# Patient Record
Sex: Female | Born: 1995
Health system: Southern US, Community
[De-identification: ages and names within clinical notes are randomized; demographics above are authoritative.]

## PROBLEM LIST (undated history)

## (undated) DIAGNOSIS — G250 Essential tremor: Secondary | ICD-10-CM

## (undated) DIAGNOSIS — R519 Headache, unspecified: Secondary | ICD-10-CM

## (undated) DIAGNOSIS — R110 Nausea: Secondary | ICD-10-CM

## (undated) DIAGNOSIS — R011 Cardiac murmur, unspecified: Secondary | ICD-10-CM

## (undated) DIAGNOSIS — R1013 Epigastric pain: Secondary | ICD-10-CM

## (undated) DIAGNOSIS — K219 Gastro-esophageal reflux disease without esophagitis: Secondary | ICD-10-CM

## (undated) DIAGNOSIS — R569 Unspecified convulsions: Secondary | ICD-10-CM

## (undated) DIAGNOSIS — F419 Anxiety disorder, unspecified: Secondary | ICD-10-CM

## (undated) DIAGNOSIS — F840 Autistic disorder: Secondary | ICD-10-CM

## (undated) DIAGNOSIS — F329 Major depressive disorder, single episode, unspecified: Secondary | ICD-10-CM

## (undated) DIAGNOSIS — F32A Depression, unspecified: Secondary | ICD-10-CM

## (undated) HISTORY — DX: Gastro-esophageal reflux disease without esophagitis: K21.9

## (undated) HISTORY — DX: Essential tremor: G25.0

## (undated) HISTORY — PX: OTHER SURGICAL HISTORY: SHX169

## (undated) HISTORY — DX: Headache, unspecified: R51.9

## (undated) HISTORY — DX: Autistic disorder: F84.0

## (undated) HISTORY — DX: Epigastric pain: R10.13

## (undated) HISTORY — PX: NO PAST SURGERIES: SHX2092

## (undated) HISTORY — PX: UPPER GI ENDOSCOPY: SHX6162

## (undated) HISTORY — DX: Nausea: R11.0

---

## 2016-12-10 ENCOUNTER — Encounter (HOSPITAL_COMMUNITY): Payer: Self-pay | Admitting: Emergency Medicine

## 2016-12-10 ENCOUNTER — Ambulatory Visit (HOSPITAL_COMMUNITY)
Admission: EM | Admit: 2016-12-10 | Discharge: 2016-12-10 | Disposition: A | Discharge status: Home / Self Care | Payer: BLUE CROSS/BLUE SHIELD

## 2016-12-10 DIAGNOSIS — N390 Urinary tract infection, site not specified: Secondary | ICD-10-CM

## 2016-12-10 DIAGNOSIS — R350 Frequency of micturition: Secondary | ICD-10-CM | POA: Diagnosis not present

## 2016-12-10 HISTORY — DX: Depression, unspecified: F32.A

## 2016-12-10 HISTORY — DX: Unspecified convulsions: R56.9

## 2016-12-10 HISTORY — DX: Major depressive disorder, single episode, unspecified: F32.9

## 2016-12-10 HISTORY — DX: Anxiety disorder, unspecified: F41.9

## 2016-12-10 LAB — POCT URINALYSIS DIP (DEVICE)
Bilirubin Urine: NEGATIVE
GLUCOSE, UA: NEGATIVE mg/dL
Hgb urine dipstick: NEGATIVE
Ketones, ur: NEGATIVE mg/dL
LEUKOCYTES UA: NEGATIVE
NITRITE: NEGATIVE
Protein, ur: NEGATIVE mg/dL
Specific Gravity, Urine: 1.015 (ref 1.005–1.030)
UROBILINOGEN UA: 0.2 mg/dL (ref 0.0–1.0)
pH: 7 (ref 5.0–8.0)

## 2016-12-10 NOTE — Discharge Instructions (Signed)
For any worsening or new symptoms may return. Try to follow up with primary care. The medication to take for any discomfort is called AZO standard, the generic is phenazopyridine which is generic. Your urine test is completely normal. No signs of infection.

## 2016-12-10 NOTE — ED Triage Notes (Signed)
Pt reports frequency of urination.  She denies any other symptoms.

## 2016-12-10 NOTE — ED Provider Notes (Addendum)
[facility]    CSN: [id number] Arrival date & time: 12/10/16  1036     History   Chief Complaint Chief Complaint  Patient presents with  . Urinary Tract Infection    HPI Kilee Hedding is a 21 y.o. female.   21 year old female who recently moved to [place] from [place] complaining of urinary frequency for just a few days. Denies dysuria or other discomfort with urination. She states she has a normal strain and empties her bladder normally. Occasionally fish she feels some urgency as if she is got to go again a while after she urinates. No fever or chills, abdominal or pelvic pain, no vaginal discharge.      Past Medical History:  Diagnosis Date  . Anxiety   . Depression   . Seizures (HCC)     There are no active problems to display for this patient.   History reviewed. No pertinent surgical history.  OB History    No data available       Home Medications    Prior to Admission medications   Medication Sig Start Date End Date Taking? Authorizing Provider  ARIPiprazole (ABILIFY) 5 MG tablet Take 5 mg by mouth 2 (two) times daily.   Yes [provider]  buPROPion (WELLBUTRIN) 100 MG tablet Take 100 mg by mouth 2 (two) times daily.   Yes [provider]  clonazePAM (KLONOPIN) 0.5 MG tablet Take 0.5 mg by mouth 2 (two) times daily as needed for anxiety.   Yes [provider]  levETIRAcetam (KEPPRA) 500 MG tablet Take 500 mg by mouth 2 (two) times daily.   Yes [provider]  primidone (MYSOLINE) 50 MG tablet Take 50 mg by mouth 2 (two) times daily.   Yes [provider]  vortioxetine HBr (TRINTELLIX) 10 MG TABS Take 10 mg by mouth 2 (two) times daily.   Yes [provider]    Family History History reviewed. No pertinent family history.  Social History Social History  Substance Use Topics  . Smoking status: Never Smoker  . Smokeless tobacco: Never Used  . Alcohol use No      Allergies   Patient has no known allergies.   Review of Systems Review of Systems  Constitutional: Negative.   Respiratory: Negative.   Gastrointestinal: Negative.   Genitourinary: Positive for frequency. Negative for difficulty urinating, dysuria, flank pain, genital sores, menstrual problem, vaginal bleeding and vaginal discharge.  Neurological: Negative.   All other systems reviewed and are negative.    Physical Exam Triage Vital Signs ED Triage Vitals [12/10/16 1116]  Enc Vitals Group     BP 100/67     Pulse Rate 80     Resp      Temp 99 F (37.2 C)     Temp Source Oral     SpO2 100 %     Weight      Height      Head Circumference      Peak Flow      Pain Score      Pain Loc      Pain Edu?      Excl. in GC?    No data found.   Updated Vital Signs BP 100/67 (BP Location: Right Arm)   Pulse 80   Temp 99 F (37.2 C) (Oral)   LMP 11/19/2016 (Approximate)   SpO2 100%   Visual Acuity Right Eye Distance:   Left Eye Distance:   Bilateral Distance:  Right Eye Near:   Left Eye Near:    Bilateral Near:     Physical Exam  Constitutional: She is oriented to person, place, and time. She appears well-developed and well-nourished. No distress.  Eyes: EOM are normal.  Neck: Neck supple.  Cardiovascular: Normal rate.   Pulmonary/Chest: Effort normal. No respiratory distress.  Musculoskeletal: She exhibits no edema.  Neurological: She is alert and oriented to person, place, and time. She exhibits normal muscle tone.  Skin: Skin is warm and dry.  Psychiatric: She has a normal mood and affect.  Nursing note and vitals reviewed.    UC Treatments / Results  Labs (all labs ordered are listed, but only abnormal results are displayed) Labs Reviewed  POCT URINALYSIS DIP (DEVICE)   Results for orders placed or performed during the hospital encounter of 12/10/16  POCT urinalysis dip (device)  Result Value Ref Range   Glucose, UA NEGATIVE NEGATIVE mg/dL    Bilirubin Urine NEGATIVE NEGATIVE   Ketones, ur NEGATIVE NEGATIVE mg/dL   Specific Gravity, Urine 1.015 1.005 - 1.030   Hgb urine dipstick NEGATIVE NEGATIVE   pH 7.0 5.0 - 8.0   Protein, ur NEGATIVE NEGATIVE mg/dL   Urobilinogen, UA 0.2 0.0 - 1.0 mg/dL   Nitrite NEGATIVE NEGATIVE   Leukocytes, UA NEGATIVE NEGATIVE    EKG  EKG Interpretation None       Radiology No results found.  Procedures Procedures (including critical care time)  Medications Ordered in UC Medications - No data to display   Initial Impression / Assessment and Plan / UC Course  I have reviewed the triage vital signs and the nursing notes.  Pertinent labs & imaging results that were available during my care of the patient were reviewed by me and considered in my medical decision making (see chart for details).     For any worsening or new symptoms may return. Try to follow up with primary care. The medication to take for any discomfort is called AZO standard, the generic is phenazopyridine which is generic. Your urine test is completely normal. No signs of infection.   Final Clinical Impressions(s) / UC Diagnoses   Final diagnoses:  Urinary frequency  Lower urinary tract infectious disease    New Prescriptions New Prescriptions   No medications on file   Written and verbal instructions given to patient and discharged at 1205 hrs.  Controlled Substance Prescriptions [place] Controlled Substance Registry consulted? Not Applicable   Hayden RasmussenMabe, Corneisha Alvi, NP 12/10/16 1205    Hayden RasmussenMabe, Brianne Maina, NP 12/10/16 1216

## 2016-12-16 ENCOUNTER — Encounter (HOSPITAL_COMMUNITY): Payer: Self-pay | Admitting: Emergency Medicine

## 2016-12-16 ENCOUNTER — Ambulatory Visit (HOSPITAL_COMMUNITY)
Admission: EM | Admit: 2016-12-16 | Discharge: 2016-12-16 | Disposition: A | Discharge status: Home / Self Care | Payer: BLUE CROSS/BLUE SHIELD | Attending: Family Medicine | Admitting: Family Medicine

## 2016-12-16 DIAGNOSIS — K5900 Constipation, unspecified: Secondary | ICD-10-CM | POA: Diagnosis not present

## 2016-12-16 DIAGNOSIS — R3915 Urgency of urination: Secondary | ICD-10-CM

## 2016-12-16 LAB — POCT URINALYSIS DIP (DEVICE)
BILIRUBIN URINE: NEGATIVE
GLUCOSE, UA: NEGATIVE mg/dL
HGB URINE DIPSTICK: NEGATIVE
Ketones, ur: NEGATIVE mg/dL
LEUKOCYTES UA: NEGATIVE
Nitrite: NEGATIVE
Protein, ur: NEGATIVE mg/dL
Urobilinogen, UA: 0.2 mg/dL (ref 0.0–1.0)
pH: 5.5 (ref 5.0–8.0)

## 2016-12-16 NOTE — ED Triage Notes (Addendum)
Seen 8/14, continued urgency.  Patient is having nausea for a week.  Vomited medicines that were recommended at last visit.

## 2016-12-16 NOTE — ED Provider Notes (Signed)
[facility]    CSN: [id number] Arrival date & time: 12/16/16  1203     History   Chief Complaint Chief Complaint  Patient presents with  . Nausea    HPI Jill Alexander is a 21 y.o. female.   21 year old female comes in with mother for continued urinary urgency after being seen 6 days ago. She now also has nausea. She drinks 2-3 cups of coffee every day, but states that is normal for her. She denies any fever, chills, night sweats. Denies abdominal pain, vomiting. She has been having increased straining, with small hard stools, unsure when last bowel movement was. Denies blood in stool. Denies back pain. Denies vaginal discharge, itching/pain, vaginal spotting. Denies dysuria, hematuria.      Past Medical History:  Diagnosis Date  . Anxiety   . Depression   . Seizures (HCC)     There are no active problems to display for this patient.   History reviewed. No pertinent surgical history.  OB History    No data available       Home Medications    Prior to Admission medications   Medication Sig Start Date End Date Taking? Authorizing Provider  LamoTRIgine (LAMICTAL PO) Take by mouth.   Yes [provider]  ARIPiprazole (ABILIFY) 5 MG tablet Take 5 mg by mouth 2 (two) times daily.    [provider]  buPROPion (WELLBUTRIN) 100 MG tablet Take 100 mg by mouth 2 (two) times daily.    [provider]  clonazePAM (KLONOPIN) 0.5 MG tablet Take 0.5 mg by mouth 2 (two) times daily as needed for anxiety.    [provider]  levETIRAcetam (KEPPRA) 500 MG tablet Take 500 mg by mouth 2 (two) times daily.    [provider]  primidone (MYSOLINE) 50 MG tablet Take 50 mg by mouth 2 (two) times daily.    [provider]  vortioxetine HBr (TRINTELLIX) 10 MG TABS Take 10 mg by mouth 2 (two) times daily.    [provider]    Family History No family history on file.  Social History Social History    Substance Use Topics  . Smoking status: Never Smoker  . Smokeless tobacco: Never Used  . Alcohol use No     Allergies   Patient has no known allergies.   Review of Systems Review of Systems  Reason unable to perform ROS: See HPI as above.     Physical Exam Triage Vital Signs ED Triage Vitals [12/16/16 1306]  Enc Vitals Group     BP 111/67     Pulse Rate 75     Resp 20     Temp 98.7 F (37.1 C)     Temp Source Oral     SpO2 100 %     Weight      Height      Head Circumference      Peak Flow      Pain Score      Pain Loc      Pain Edu?      Excl. in GC?    No data found.   Updated Vital Signs BP 111/67 (BP Location: Left Arm)   Pulse 75   Temp 98.7 F (37.1 C) (Oral)   Resp 20   LMP 11/19/2016 (Approximate)   SpO2 100%      Physical Exam  Constitutional: She appears well-developed and well-nourished. No distress.  HENT:  Head: Normocephalic and atraumatic.  Cardiovascular: Normal  rate, regular rhythm and normal heart sounds.  Exam reveals no gallop and no friction rub.   No murmur heard. Pulmonary/Chest: Effort normal and breath sounds normal. She has no wheezes. She has no rales.  Abdominal: Soft. Bowel sounds are normal. There is no tenderness. There is no rebound and no guarding.  Hard to palpation along the colon track.      UC Treatments / Results  Labs (all labs ordered are listed, but only abnormal results are displayed) Labs Reviewed  POCT URINALYSIS DIP (DEVICE)    EKG  EKG Interpretation None       Radiology No results found.  Procedures Procedures (including critical care time)  Medications Ordered in UC Medications - No data to display   Initial Impression / Assessment and Plan / UC Course  I have reviewed the triage vital signs and the nursing notes.  Pertinent labs & imaging results that were available during my care of the patient were reviewed by me and considered in my medical decision making (see chart for  details).    Discussed with patient and mother, history and exam most consistent with constipation, which could be causing the nausea. Patient to take Colace and MiraLAX for constipation. Urine dipstick negative for UTI. Return precautions given.  Final Clinical Impressions(s) / UC Diagnoses   Final diagnoses:  Constipation, unspecified constipation type  Urinary urgency    New Prescriptions Discharge Medication List as of 12/16/2016  2:06 PM       Belinda Fisher, PA-C 12/16/16 1523

## 2016-12-16 NOTE — Discharge Instructions (Signed)
Take colace and miralax for constipation. Probiotics as needed. Keep hydrated, your urine should be clear to pale yellow in color. You will be contacted if urine dipstick is positive. Treatment needed will be sent in then. Monitor for any worsening of symptoms, abdominal pain, continued nausea and vomiting, fever, follow-up for reevaluation.

## 2017-04-08 ENCOUNTER — Ambulatory Visit: Payer: Medicaid Other | Admitting: Neurology

## 2017-04-09 ENCOUNTER — Encounter: Payer: Self-pay | Admitting: Neurology

## 2017-04-09 ENCOUNTER — Ambulatory Visit: Payer: Medicaid Other | Admitting: Neurology

## 2017-04-09 ENCOUNTER — Other Ambulatory Visit: Payer: Self-pay

## 2017-04-09 VITALS — BP 111/66 | HR 73 | Ht 63.0 in | Wt 147.0 lb

## 2017-04-09 DIAGNOSIS — G250 Essential tremor: Secondary | ICD-10-CM

## 2017-04-09 DIAGNOSIS — R569 Unspecified convulsions: Secondary | ICD-10-CM | POA: Diagnosis not present

## 2017-04-09 HISTORY — DX: Essential tremor: G25.0

## 2017-04-09 MED ORDER — LEVETIRACETAM 500 MG PO TABS: 500.0000 mg | ORAL_TABLET | Freq: Two times a day (BID) | ORAL | 3 refills | Status: DC

## 2017-04-09 NOTE — Progress Notes (Signed)
Reason for visit: Seizures, tremor  Referring physician: Dr. Alexis Goodelleese  Jill Alexander is a 21 y.o. female  History of present illness:  Ms. Jill Alexander is a 21 year old left-handed white female with a history of Asperger's disease.  The patient has been treated off and on for an essential tremor in both hands, she has been on Mysoline, Topamax, clonazepam, and propranolol in the past without much benefit.  She has been treated for her depression with Wellbutrin, she remains on this medication.  In 2014, the patient had a witnessed generalized tonic-clonic seizure.  The patient has no warning prior to the seizures.  She was taken to the hospital and the mother indicates that she had a MRI of the brain that was reportedly unremarkable.  The mother believes that she had an EEG study, but no record of this is in her medical records.  The patient had a second seizure event in 2015, again witnessed.  The patient does not operate a motor vehicle.  After the second seizure the patient was placed on Keppra taking 500 mg twice daily.  The patient seemed to tolerate this fairly well.  She does have occasional headaches, she reports some occasional vertigo, she denies any numbness or weakness of the face, arms, or legs.  She has no problems controlling the bowels or the bladder.  In general she sleeps fairly well, but she still has ongoing problems with anxiety and depression.  She will be seen by psychiatry within the next month.  [place] has [place] the [place].  She has had some blood work done in July 2018.  This has included a normal comprehensive metabolic profile, BUN is 12, creatinine 0.71.  A vitamin B12 level was 659, a free T3 level was normal at 3.2.  Free T4 level was normal at 1.18, TSH was 0.53.  The patient comes to this office for an evaluation.  She has not had a seizure since 2015 on Keppra.  Past Medical History:  Diagnosis Date  . Anxiety   . Depression   .  Seizures (HCC)     Past Surgical History:  Procedure Laterality Date  . NO PAST SURGERIES      History reviewed. No pertinent family history.  Social history:  reports that  has never smoked. she has never used smokeless tobacco. She reports that she does not drink alcohol or use drugs.  Medications:  Prior to Admission medications   Medication Sig Start Date End Date Taking? Authorizing Provider  Acetaminophen (MIDOL PO) Take by mouth.   Yes [provider]  Acetaminophen (TYLENOL PO) Take by mouth as needed.   Yes [provider]  ARIPiprazole (ABILIFY) 5 MG tablet Take 5 mg by mouth 4 (four) times daily as needed.    Yes [provider]  bismuth subsalicylate (PEPTO BISMOL) 262 MG/15ML suspension Take 30 mLs by mouth every 6 (six) hours as needed.   Yes [provider]  buPROPion (WELLBUTRIN) 100 MG tablet Take 100 mg by mouth 2 (two) times daily.   Yes [provider]  Calcium Carbonate Antacid (TUMS PO) Take by mouth.   Yes [provider]  hydrOXYzine (ATARAX/VISTARIL) 10 MG tablet Take 10 mg by mouth 3 (three) times daily as needed.   Yes [provider]  Ibuprofen (MOTRIN PO) Take 1 Dose by mouth as needed.   Yes [provider]  LamoTRIgine (LAMICTAL PO) Take 100 mg by mouth 2 (two) times daily.  Yes [provider]  levETIRAcetam (KEPPRA) 500 MG tablet Take 500 mg by mouth 2 (two) times daily.   Yes [provider]  Melatonin 10 MG TABS Take 10 mg by mouth daily.   Yes [provider]  Multiple Vitamins-Minerals (MULTIVITAMIN PO) Take 1 tablet by mouth daily.   Yes [provider]  SIMETHICONE PO Take 1 Dose by mouth as needed.   Yes [provider]  vortioxetine HBr (TRINTELLIX) 20 MG TABS Take 20 mg by mouth daily.   Yes [provider]     No Known Allergies  ROS:  Out of a complete 14 system review of symptoms, the patient complains only of  the following symptoms, and all other reviewed systems are negative.  Fatigue Ringing in the ears, dizziness, difficulty swallowing Diarrhea, constipation Confusion, headache, dizziness, tremor Depression, anxiety, too much sleep, decreased energy, disinterest in activities Insomnia, snoring  Blood pressure 111/66, pulse 73, height 5\' 3"  (1.6 m), weight 147 lb (66.7 kg).  Physical Exam  General: The patient is alert and cooperative at the time of the examination.  Eyes: Pupils are equal, round, and reactive to light. Discs are flat bilaterally.  Neck: The neck is supple, no carotid bruits are noted.  Respiratory: The respiratory examination is clear.  Cardiovascular: The cardiovascular examination reveals a regular rate and rhythm, no obvious murmurs or rubs are noted.  Skin: Extremities are without significant edema.  Neurologic Exam  Mental status: The patient is alert and oriented x 3 at the time of the examination. The patient has apparent normal recent and remote memory, with an apparently normal attention span and concentration ability.  Cranial nerves: Facial symmetry is present. There is good sensation of the face to pinprick and soft touch bilaterally. The strength of the facial muscles and the muscles to head turning and shoulder shrug are normal bilaterally. Speech is well enunciated, no aphasia or dysarthria is noted. Extraocular movements are full. Visual fields are full. The tongue is midline, and the patient has symmetric elevation of the soft palate. No obvious hearing deficits are noted.  Motor: The motor testing reveals 5 over 5 strength of all 4 extremities. Good symmetric motor tone is noted throughout.  Sensory: Sensory testing is intact to pinprick, soft touch, vibration sensation, and position sense on all 4 extremities. No evidence of extinction is noted.  Coordination: Cerebellar testing reveals good finger-nose-finger and heel-to-shin bilaterally.   Significant tremors are noted with finger-nose-finger bilaterally.  Slight apraxia with the use of the upper extremities is noted.  Gait and station: Gait is normal. Tandem gait is normal. Romberg is negative. No drift is seen.  Reflexes: Deep tendon reflexes are symmetric and normal bilaterally. Toes are downgoing bilaterally.   Assessment/Plan:  1.  History of seizures  2.  Asperger syndrome  3.  Essential tremor  The patient will be continued on Keppra, a prescription was sent in.  An EEG study will be set up.  Given the significant issues with the essential tremors, the patient will be set up for an occupational therapy evaluation.  The patient does have difficulty performing activities of daily living with the tremor.  She will follow-up in 6 months.  Marlan Palau. Keith Willis MD 04/09/2017 4:52 PM  [facility] [address] [place], [place] [id number]  Phone [phone] Fax [phone]

## 2017-04-09 NOTE — Patient Instructions (Addendum)
   We will get an OT referral. An EEG study will be set up.

## 2017-04-16 ENCOUNTER — Ambulatory Visit: Payer: Medicaid Other | Admitting: Neurology

## 2017-04-16 ENCOUNTER — Telehealth: Payer: Self-pay | Admitting: Neurology

## 2017-04-16 DIAGNOSIS — R569 Unspecified convulsions: Secondary | ICD-10-CM | POA: Diagnosis not present

## 2017-04-16 NOTE — Procedures (Signed)
    History:  Jill Alexander is a 21 year old patient with a history of Asperger's disease who has had 2 seizure events, the first was in 2014 and a second in 2015.  The patient has been on Keppra therapy for this.  She is being evaluated for the seizures.  This is a routine EEG.  No skull defects are noted.  Medications include Abilify, Wellbutrin, calcium supplementation, Motrin, Lamictal, Keppra, melatonin, multivitamins, and Trintellix.  EEG classification: Normal awake  Description of the recording: The background rhythms of this recording consists of a fairly well modulated medium amplitude alpha rhythm of 9 Hz that is reactive to eye opening and closure. As the record progresses, the patient appears to remain in the waking state throughout the recording. Photic stimulation was performed, resulting in a bilateral and symmetric photic driving response. Hyperventilation was also performed, resulting in a minimal buildup of the background rhythm activities without significant slowing seen. At no time during the recording does there appear to be evidence of spike or spike wave discharges or evidence of focal slowing. EKG monitor shows no evidence of cardiac rhythm abnormalities with a heart rate of 84.  Impression: This is a normal EEG recording in the waking state. No evidence of ictal or interictal discharges are seen.

## 2017-04-16 NOTE — Telephone Encounter (Signed)
I called the mother.  The EEG study appears to be normal.  The patient will remain on Keppra for now, she will follow-up in office in the future.

## 2017-05-21 ENCOUNTER — Encounter (HOSPITAL_COMMUNITY): Payer: Self-pay | Admitting: Psychiatry

## 2017-05-21 ENCOUNTER — Ambulatory Visit (INDEPENDENT_AMBULATORY_CARE_PROVIDER_SITE_OTHER): Payer: Medicaid Other | Admitting: Psychiatry

## 2017-05-21 VITALS — BP 112/64 | HR 79 | Ht 62.0 in | Wt 144.0 lb

## 2017-05-21 DIAGNOSIS — F4010 Social phobia, unspecified: Secondary | ICD-10-CM

## 2017-05-21 DIAGNOSIS — Z79899 Other long term (current) drug therapy: Secondary | ICD-10-CM

## 2017-05-21 DIAGNOSIS — R569 Unspecified convulsions: Secondary | ICD-10-CM

## 2017-05-21 DIAGNOSIS — F332 Major depressive disorder, recurrent severe without psychotic features: Secondary | ICD-10-CM | POA: Diagnosis not present

## 2017-05-21 DIAGNOSIS — F840 Autistic disorder: Secondary | ICD-10-CM | POA: Diagnosis not present

## 2017-05-21 DIAGNOSIS — Z915 Personal history of self-harm: Secondary | ICD-10-CM | POA: Diagnosis not present

## 2017-05-21 DIAGNOSIS — F429 Obsessive-compulsive disorder, unspecified: Secondary | ICD-10-CM | POA: Diagnosis not present

## 2017-05-21 DIAGNOSIS — Z818 Family history of other mental and behavioral disorders: Secondary | ICD-10-CM

## 2017-05-21 MED ORDER — MELATONIN 10 MG PO TABS: 10.0000 mg | ORAL_TABLET | Freq: Every day | ORAL | Status: DC

## 2017-05-21 MED ORDER — LAMOTRIGINE 200 MG PO TABS: 200.0000 mg | ORAL_TABLET | Freq: Every day | ORAL | 1 refills | Status: DC

## 2017-05-21 MED ORDER — ARIPIPRAZOLE 5 MG PO TABS: 5.0000 mg | ORAL_TABLET | Freq: Every day | ORAL | 1 refills | Status: DC

## 2017-05-21 MED ORDER — FLUOXETINE HCL 20 MG PO CAPS: ORAL_CAPSULE | ORAL | 0 refills | Status: DC

## 2017-05-21 MED ORDER — LORAZEPAM 2 MG PO TABS: 2.0000 mg | ORAL_TABLET | Freq: Four times a day (QID) | ORAL | 0 refills | Status: DC | PRN

## 2017-05-21 NOTE — Progress Notes (Signed)
Psychiatric Initial Adult Assessment   Patient Identification: Jill Alexander MRN:  [id number] Date of Evaluation:  05/21/2017 Referral Source: self Chief Complaint:  high functioning autism, med management Visit Diagnosis:    ICD-10-CM   1. Severe episode of recurrent major depressive disorder, without psychotic features (HCC) F33.2 lamoTRIgine (LAMICTAL) 200 MG tablet    Melatonin 10 MG TABS    ARIPiprazole (ABILIFY) 5 MG tablet    FLUoxetine (PROZAC) 20 MG capsule    LORazepam (ATIVAN) 2 MG tablet  2. Autism spectrum disorder F84.0 lamoTRIgine (LAMICTAL) 200 MG tablet    Melatonin 10 MG TABS    ARIPiprazole (ABILIFY) 5 MG tablet    FLUoxetine (PROZAC) 20 MG capsule    LORazepam (ATIVAN) 2 MG tablet  3. Seizures ([place]) R56.9   4. Obsessive-compulsive disorder, unspecified type F42.9   5. Social anxiety disorder F40.10     History of Present Illness:  Jill Alexander is a 22 year old female with a history of seizures, and high functioning autism spectrum disorder.  Presents today for medication management and psychiatric intake assessment.  Patient presents today with her mother who is able to provide collateral.  Patient and family recently moved from [place] for her dad's job.  She reports that she has 2 older siblings with whom she gets along, but they are in [place] and [place].  She reports that she is not currently in school, had attempted to do some college at a specialty program at [facility] [employer] [employer] [facility], or geared towards individuals with autism, but this was did not a good fit and she ended up dropping out.  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  She feels like her depression and irritability make it difficult for her to cope with new situations, and adjusted to new stressors.  I confirm with the patient that she was diagnosed with autism spectrum disorder, full scale IQ 110, in the second grade.  She has  participated in psychiatric care since childhood, including medication management and therapy.  She is currently seeing a therapist in the community, Jill Alexander, and feels comfortable with that individual.  She has been receiving medication management care at [facility] and has felt extremely dissatisfied with the care.  She reports that she had seen a psychiatrist for nearly 10 years in [place], and had been tried on a myriad of psychiatric medications for depression including Viibryd, clomipramine, Prozac, Trintellix, Lamictal, Abilify, briefly Adderall for attention difficulties.  Her Prozac was the first 1 that she was tried on as a child, and seem to be quite effective for her for a brief period of time, approximately 1 year.  Educated family and patient on the neurotrophic growth factors associated with fluoxetine, and the generally good side effect profile, for giving half-life, and efficacy for OCD, anxiety, depression.  The patient continues to struggle with patterns of obsessions and rumination, and will become fixated on certain stressors or anxieties, and cycle through them in her head.  This tends to harm her self-esteem, and make her irritability worse.  She tends to have trouble sleeping at night due to worry and anxiety.  She does take melatonin, but is not taking it at an appropriate time, she takes it at 10 PM.  I educated her that this needs to be taken at [place].  She was receptive to this.  With regard to Lamictal, this was initiated for mood augmentation, along with Abilify 5 mg daily.  She reports that it is difficult to know how these have been helpful or not for her.  She does also take Wellbutrin 100 mg twice a day, and has been on this for years without any clear-cut benefit.  The patient has a history of seizures, experiencing 2 seizures in her young adulthood, the last one was in 2015.  It is suspected that this was likely contributed by the presence of Wellbutrin.  I instructed  patient and family to discontinue Wellbutrin and they were very much on board with this.  Per their recollection, there neurologist may be open to tapering Keppra and discontinuing, I also expressed concern that Keppra can be associated with irritability and dysphoria in some individuals.  We agreed to adjust the medication regimen below and I provided instructions on titration of Prozac, continuation of Lamictal, continuation of Abilify, and appropriate timing for melatonin.  The patient has received Ativan in the past for procedures, and medical visits, and it tends to be calming.  I agreed to provide her with a dose of 2 mg tablet to use as a "fire extinguisher" if she is extremely agitated or anxious and having difficulty settling down using coping strategies that she should be continuing to learn in therapy.   I also suggested she get connected with the group therapy in this office for depression management and patient was open to that.  Associated Signs/Symptoms: Depression Symptoms:  depressed mood, anhedonia, insomnia, fatigue, feelings of worthlessness/guilt, difficulty concentrating, hopelessness, impaired memory, anxiety, (Hypo) Manic Symptoms:  Irritable Mood, Labiality of Mood, Anxiety Symptoms:  Obsessive Compulsive Symptoms:   rumination, perseveration, Social Anxiety, Psychotic Symptoms:  none PTSD Symptoms: Negative  Past Psychiatric History: No psychiatric hospitalizations, prior outpatient treatment  Previous Psychotropic Medications: Yes   Substance Abuse History in the last 12 months:  No.  Consequences of Substance Abuse: Negative  Past Medical History:  Past Medical History:  Diagnosis Date  . Anxiety   . Autism   . Depression   . Seizures ([place])   . 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 NO PAST SURGERIES      Family Psychiatric History: No psychiatric family history  Family History:  Family History   Problem Relation Age of Onset  . Depression Father   . Anxiety disorder Sister   . Bipolar disorder Sister     Social History:   Social History   Socioeconomic History  . Marital status: Single    Spouse name: None  . Number of children: None  . Years of education: None  . Highest education level: None  Social Needs  . Financial resource strain: None  . Food insecurity - worry: None  . Food insecurity - inability: None  . Transportation needs - medical: None  . Transportation needs - non-medical: None  Occupational History  . None  Tobacco Use  . Smoking status: Never Smoker  . Smokeless tobacco: Never Used  Substance and Sexual Activity  . Alcohol use: No  . Drug use: No  . Sexual activity: No  Other Topics Concern  . None  Social History Narrative   Lives with parents   Caffeine use: 2-3 cups per day   Left handed     Additional Social History: Patient has a Environmental education officer, lives with mom and dad, has a cat  Allergies:  No Known Allergies  Metabolic Disorder Labs: No results found for: HGBA1C, MPG No results found for: PROLACTIN No  results found for: CHOL, TRIG, HDL, CHOLHDL, VLDL, LDLCALC   Current Medications: Current Outpatient Medications  Medication Sig Dispense Refill  . Acetaminophen (MIDOL PO) Take by mouth.    . ARIPiprazole (ABILIFY) 5 MG tablet Take 1 tablet (5 mg total) by mouth daily. 90 tablet 1  . bismuth subsalicylate (PEPTO BISMOL) 262 MG/15ML suspension Take 30 mLs by mouth every 6 (six) hours as needed.    . Calcium Carbonate Antacid (TUMS PO) Take by mouth.    . Ibuprofen (MOTRIN PO) Take 1 Dose by mouth as needed.    . levETIRAcetam (KEPPRA) 500 MG tablet Take 1 tablet (500 mg total) by mouth 2 (two) times daily. 180 tablet 3  . Melatonin 10 MG TABS Take 10 mg by mouth daily. Take at sundown every night    . Multiple Vitamins-Minerals (MULTIVITAMIN PO) Take 1 tablet by mouth daily.    Marland Kitchen FLUoxetine (PROZAC) 20 MG capsule Take 1  capsule (20 mg total) by mouth daily for 14 days, THEN 2 capsules (40 mg total) daily. 194 capsule 0  . lamoTRIgine (LAMICTAL) 200 MG tablet Take 1 tablet (200 mg total) by mouth daily. 90 tablet 1  . LORazepam (ATIVAN) 2 MG tablet Take 1 tablet (2 mg total) by mouth every 6 (six) hours as needed for anxiety (panic, agitation). 30 tablet 0  . SIMETHICONE PO Take 1 Dose by mouth as needed.     No current facility-administered medications for this visit.     Neurologic: Headache: Negative Seizure: Negative no seizures since 2015 Paresthesias:Negative  Musculoskeletal: Strength & Muscle Tone: within normal limits Gait & Station: normal Patient leans: N/A  Psychiatric Specialty Exam: Review of Systems  Constitutional: Negative.   HENT: Negative.   Eyes: Negative.   Respiratory: Negative.   Cardiovascular: Negative.   Gastrointestinal: Negative.   Genitourinary: Negative.   Musculoskeletal: Negative.   Neurological: Negative.  Negative for seizures and loss of consciousness.  Psychiatric/Behavioral: Positive for depression. Negative for hallucinations, memory loss, substance abuse and suicidal ideas. The patient is nervous/anxious and has insomnia.     Blood pressure 112/64, pulse 79, height 5' 2"  (1.575 m), weight 144 lb (65.3 kg), SpO2 97 %.Body mass index is 26.34 kg/m.  General Appearance: Casual and Fairly Groomed  Eye Contact:  Poor  Speech:  Normal Rate  Volume:  Decreased  Mood:  Dysphoric  Affect:  Congruent and Flat  Thought Process:  Goal Directed and Descriptions of Associations: Intact  Orientation:  Full (Time, Place, and Person)  Thought Content:  Computation  Suicidal Thoughts:  No  Homicidal Thoughts:  No  Memory:  Immediate;   Fair  Judgement:  Fair  Insight:  Fair  Psychomotor Activity:  Normal and Tremor  Concentration:  Concentration: Fair  Recall:  [employer] of Knowledge:Fair  Language: Fair  Akathisia:  Negative  Handed:  Right  AIMS (if  indicated):  0  Assets:  Desire for Improvement Financial Resources/Insurance Housing Social Support Transportation  ADL's:  Intact  Cognition: WNL  Sleep:  Poor, 6-12 hours, inconsistent    Treatment Plan Summary: Idaly Verret is a 22 year old female with a psychiatric history of autism spectrum disorder, associated major depressive disorder, OCD, social anxiety, and learning disorder.  She has a full scale IQ of approximately 110 Per collateral as discussed with mom.  She has no history of psychiatric hospitalization, but does have a history of self-harm behaviors when she was in her teenage years.  She presents today for  psychiatric medication management transfer of care from [facility].  She continues to engage in individual therapy with a provider in the community, with whom she feels comfortable.  She and family moved here from [place] approximately 7 months ago, and this has been a stressful transition.  Prior to the transition, the patient's mood and anxiety symptoms continued to be troublesome, and she had been tried on a myriad of psychiatric medications.  She appeared to have the best response and longevity of response to Prozac, but the dose was quite low as she was 77-33 years old.  We agreed to restart Prozac and titrate fairly quickly to 40 mg daily.  I suggested we continue Lamictal for augmentation of depression and OCD symptoms, and continue Abilify for now given the body of evidence for agitation associated with autism.  She has a history of seizures, and it remains unclear to me why she has been continued on Wellbutrin given this history, and given her continued struggle with anxiety.  I have instructed family and patient to discontinue Wellbutrin, and I would hope that this will make it easier for her to be tapered off of Keppra given that she has not had a seizure in over 3 years.  I will defer to neurology on making this determination, but would certainly support a trial off  of antiepileptic.  We will continue as below, and patient is agreeable to continue in individual therapy and initiate group therapy in this office.  We will follow-up in 8-10 weeks or sooner if needed.  1. Severe episode of recurrent major depressive disorder, without psychotic features ([place])   2. Autism spectrum disorder   3. Seizures ([place])   4. Obsessive-compulsive disorder, unspecified type   5. Social anxiety disorder     Status of current problems: new to Probation officer  Labs Ordered: No orders of the defined types were placed in this encounter.   Labs Reviewed: NA  Collateral Obtained/Records Reviewed: Collateral discussed with mom as above, confirms no acute safety issues or substance abuse.  Mom was able to provide an account of past medication trials and response  Plan:  Continue with neurology for seizure control med management; would be in support of taper off Keppra given that we have now discontinued Wellbutrin  Discontinue Wellbutrin given history of seizures and lack of benefit Discontinue Trintellix given lack of benefit and nausea Discontinue hydroxyzine given lack of use Initiate Prozac 20 mg x 2 weeks, then increase to 40 mg Continue Lamictal, consolidated to 200 mg once a day in the morning Continue Abilify 5 mg in the morning Instructed patient to take melatonin at sundown for best effect Return to clinic in 10 weeks Ativan 2 mg tablet for acute anxiety or agitation, as needed, not daily   I spent 50 minutes with the patient in direct face-to-face clinical care.  Greater than 50% of this time was spent in counseling and coordination of care with the patient.    Aundra Dubin, MD 1/23/201910:59 AM

## 2017-05-21 NOTE — Patient Instructions (Addendum)
STOP Wellbutrin - I suspect you may be sleepy for a few days STOP Trintellix STOP Vistaril (hydroxyzine) Call Neurologist to ask them for a taper schedule for Keppra - this can increase irritability   ALWAYS melatonin at sundown START Prozac 20 mg daily, increase to 40 mg in 2 weeks as long as no side effects CONTINUE Lamictal 200 mg tablet CONTINUE Abilify 5 mg in the morning ONLY   Ativan 2 mg tablet for Anger/agitation -  Do not drive if you take this medicine

## 2017-05-22 ENCOUNTER — Encounter (HOSPITAL_COMMUNITY): Payer: Self-pay | Admitting: Psychiatry

## 2017-05-23 ENCOUNTER — Other Ambulatory Visit (HOSPITAL_COMMUNITY): Payer: Self-pay | Admitting: Psychiatry

## 2017-05-23 MED ORDER — ONDANSETRON HCL 4 MG PO TABS: 4.0000 mg | ORAL_TABLET | Freq: Three times a day (TID) | ORAL | 0 refills | Status: AC | PRN

## 2017-06-02 ENCOUNTER — Encounter (HOSPITAL_COMMUNITY): Payer: Self-pay | Admitting: Emergency Medicine

## 2017-06-02 ENCOUNTER — Other Ambulatory Visit: Payer: Self-pay

## 2017-06-02 ENCOUNTER — Ambulatory Visit (HOSPITAL_COMMUNITY)
Admission: EM | Admit: 2017-06-02 | Discharge: 2017-06-02 | Disposition: A | Discharge status: Home / Self Care | Payer: Medicaid Other | Attending: Family Medicine | Admitting: Family Medicine

## 2017-06-02 DIAGNOSIS — R350 Frequency of micturition: Secondary | ICD-10-CM | POA: Diagnosis not present

## 2017-06-02 LAB — POCT URINALYSIS DIP (DEVICE)
Bilirubin Urine: NEGATIVE
Glucose, UA: NEGATIVE mg/dL
Hgb urine dipstick: NEGATIVE
KETONES UR: NEGATIVE mg/dL
Leukocytes, UA: NEGATIVE
Nitrite: NEGATIVE
PH: 7 (ref 5.0–8.0)
PROTEIN: NEGATIVE mg/dL
SPECIFIC GRAVITY, URINE: 1.02 (ref 1.005–1.030)
UROBILINOGEN UA: 0.2 mg/dL (ref 0.0–1.0)

## 2017-06-02 LAB — GLUCOSE, CAPILLARY: Glucose-Capillary: 85 mg/dL (ref 65–99)

## 2017-06-02 NOTE — ED Provider Notes (Signed)
  [facility]    ASSESSMENT & PLAN:  1. Urinary frequency    U/A norma. Discussed with her that I see no signs of infection. Monitor symptoms.  Blood glucose: 85  Will follow up with her PCP for further evaluation should symptoms persist.  Outlined signs and symptoms indicating need for more acute intervention. Patient verbalized understanding. After Visit Summary given.  SUBJECTIVE:  Jill Alexander is a 22 y.o. female who complains of urinary frequency for the past week. On and off. Similar symptoms last year that resolved without treatment. No flank pain, fever, chills, abnormal vaginal discharge or bleeding. Hematuria: not present. Normal PO intake. No flank or abdominal pain. No self treatment. Ambulatory without difficulty.  LMP: Patient's last menstrual period was 05/02/2017 (exact date).  ROS: As in HPI.  OBJECTIVE:  Vitals:   06/02/17 1213  BP: 104/65  Pulse: 70  Resp: 18  Temp: 99.5 F (37.5 C)  TempSrc: Oral  SpO2: 99%   Appears well, in no apparent distress. Abdomen is soft without tenderness, guarding, mass, rebound or organomegaly. No CVA tenderness or inguinal adenopathy noted.  Labs Reviewed  GLUCOSE, CAPILLARY  POCT URINALYSIS DIP (DEVICE)    No Known Allergies  Past Medical History:  Diagnosis Date  . Anxiety   . Autism   . Depression   . Seizures (HCC)   . Tremor, 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   Social History   Socioeconomic History  . Marital status: Single    Spouse name: Not on file  . Number of children: Not on file  . Years of education: Not on file  . Highest education level: Not on file  Social Needs  . Financial resource strain: Not on file  . Food insecurity - worry: Not on file  . Food insecurity - inability: Not on file  . Transportation needs - medical: Not on file  . Transportation needs - non-medical: Not on file  Occupational History  . Not on file  Tobacco Use  . Smoking status: Never Smoker  .  Smokeless tobacco: Never Used  Substance and Sexual Activity  . Alcohol use: No  . Drug use: No  . Sexual activity: No  Other Topics Concern  . Not on file  Social History Narrative   Lives with parents   Caffeine use: 2-3 cups per day   Left handed    Family History  Problem Relation Age of Onset  . Depression Father   . Anxiety disorder Sister   . Bipolar disorder Sister        Mardella LaymanHagler, Kelcey Korus, MD 06/02/17 1254

## 2017-06-02 NOTE — ED Triage Notes (Signed)
[facility] with a complaint of urinary frequency and some dysuria x 1 week.

## 2017-06-05 ENCOUNTER — Encounter (HOSPITAL_COMMUNITY): Payer: Self-pay | Admitting: Psychiatry

## 2017-06-08 ENCOUNTER — Encounter (HOSPITAL_COMMUNITY): Payer: Self-pay | Admitting: Psychiatry

## 2017-06-09 ENCOUNTER — Encounter: Payer: Self-pay | Admitting: Neurology

## 2017-06-12 ENCOUNTER — Other Ambulatory Visit: Payer: Self-pay | Admitting: Neurology

## 2017-06-12 MED ORDER — LEVETIRACETAM 250 MG PO TABS: 250.0000 mg | ORAL_TABLET | Freq: Two times a day (BID) | ORAL | 1 refills | Status: DC

## 2017-06-16 MED FILL — levETIRAcetam 250 MG TABS: 250 | 90 days supply | Qty: 180 | Fill #0 | Status: TO

## 2017-06-21 ENCOUNTER — Ambulatory Visit (INDEPENDENT_AMBULATORY_CARE_PROVIDER_SITE_OTHER): Payer: Medicaid Other | Admitting: Psychiatry

## 2017-06-21 ENCOUNTER — Encounter (HOSPITAL_COMMUNITY): Payer: Self-pay | Admitting: Psychiatry

## 2017-06-21 DIAGNOSIS — G40909 Epilepsy, unspecified, not intractable, without status epilepticus: Secondary | ICD-10-CM

## 2017-06-21 DIAGNOSIS — Z79899 Other long term (current) drug therapy: Secondary | ICD-10-CM

## 2017-06-21 DIAGNOSIS — F840 Autistic disorder: Secondary | ICD-10-CM | POA: Diagnosis not present

## 2017-06-21 DIAGNOSIS — F332 Major depressive disorder, recurrent severe without psychotic features: Secondary | ICD-10-CM

## 2017-06-21 DIAGNOSIS — Z818 Family history of other mental and behavioral disorders: Secondary | ICD-10-CM | POA: Diagnosis not present

## 2017-06-21 MED ORDER — BUSPIRONE HCL 10 MG PO TABS: 10.0000 mg | ORAL_TABLET | Freq: Three times a day (TID) | ORAL | 2 refills | Status: DC

## 2017-06-21 MED ORDER — LORAZEPAM 2 MG PO TABS: 2.0000 mg | ORAL_TABLET | Freq: Four times a day (QID) | ORAL | 1 refills | Status: DC | PRN

## 2017-06-21 NOTE — Progress Notes (Signed)
[facility] MD/PA/NP OP Progress Note  06/21/2017 8:55 AM Jill BickerMadeline Sarah Alexander  MRN:  [id number]  Chief Complaint:  Chief Complaint    Anxiety; Depression; Follow-up     HPI: This patient is a 22 year old female with a history of seizures and high functioning autism spectrum disorder.  She presents with her father today for medication management follow-up.  The patient had seen Dr. Rene KocherEksir on 05/21/2017 for an initial evaluation.  At that time the patient was continued on Abilify and Lamictal as previously prescribed.  However Wellbutrin was discontinued because of seizure potentiation.  She was started on Prozac and is titrated up to a dosage of 40 mg daily.  She was also given Ativan 2 mg daily for significant anxiety.  The patient had contacted Dr. Rene KocherEksir and asked about the frequency of Ativan and was advised to take it no more than 2-3 times per week.  However she continues to have severe debilitating anxiety symptoms almost every day.  She also feels somewhat depressed with the anxiety seems to take prevalence.  She has made some progress towards meeting a friend here as well [place] a [employer] [place] [employer] [employer]  However sometimes her anxiety is so debilitating that she cannot leave her house.  She denies suicidal ideation although occasionally will succumb to claiming that life is not worth living.  She denies auditory visual hallucinations or other psychotic symptoms.  Her sleep is good perhaps too much and she is eating well.  She is working with someone at the autism Society to try to help her find work or something else to do.  She has a lot of free time on her hands.  Today we discussed the pros and cons of using the Ativan more frequently.  Right now her anxiety is so debilitating that she is often unable to function.  Her father is worried about addiction potential but I doubt addiction is going to happen on 1 pill a day.  I suggested that she use the Ativan every time she need to go out and feels  anxious about it.  We will also start BuSpar which is a nonaddictive anxiety medication to try to decrease her generalized level of anxiety. Visit Diagnosis:    ICD-10-CM   1. Severe episode of recurrent major depressive disorder, without psychotic features (HCC) F33.2 LORazepam (ATIVAN) 2 MG tablet  2. Autism spectrum disorder F84.0 LORazepam (ATIVAN) 2 MG tablet    Past Psychiatric History: No psychiatric hospitalizations, prior outpatient treatment  Past Medical History:  Past Medical History:  Diagnosis Date  . Anxiety   . Autism   . Depression   . Seizures (HCC)   . 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 NO PAST SURGERIES      Family Psychiatric History: none  Family History:  Family History  Problem Relation Age of Onset  . Depression Father   . Anxiety disorder Sister   . Bipolar disorder Sister     Social History:  Social History   Socioeconomic History  . Marital status: Single    Spouse name: None  . Number of children: None  . Years of education: None  . Highest education level: None  Social Needs  . Financial resource strain: None  . Food insecurity - worry: None  . Food insecurity - inability: None  . Transportation needs - medical: None  . Transportation needs - non-medical: None  Occupational History  . None  Tobacco Use  .  Smoking status: Never Smoker  . Smokeless tobacco: Never Used  Substance and Sexual Activity  . Alcohol use: No  . Drug use: No  . Sexual activity: No  Other Topics Concern  . None  Social History Narrative   Lives with parents   Caffeine use: 2-3 cups per day   Left handed     Allergies: No Known Allergies  Metabolic Disorder Labs: No results found for: HGBA1C, MPG No results found for: PROLACTIN No results found for: CHOL, TRIG, HDL, CHOLHDL, VLDL, LDLCALC No results found for: TSH  Therapeutic Level Labs: No results found for: LITHIUM No results found for:  VALPROATE No components found for:  CBMZ  Current Medications: Current Outpatient Medications  Medication Sig Dispense Refill  . Acetaminophen (MIDOL PO) Take by mouth.    . ARIPiprazole (ABILIFY) 5 MG tablet Take 1 tablet (5 mg total) by mouth daily. 90 tablet 1  . FLUoxetine (PROZAC) 20 MG capsule Take 1 capsule (20 mg total) by mouth daily for 14 days, THEN 2 capsules (40 mg total) daily. 194 capsule 0  . Ibuprofen (MOTRIN PO) Take 1 Dose by mouth as needed.    . lamoTRIgine (LAMICTAL) 200 MG tablet Take 1 tablet (200 mg total) by mouth daily. 90 tablet 1  . levETIRAcetam (KEPPRA) 250 MG tablet Take 1 tablet (250 mg total) by mouth 2 (two) times daily. 180 tablet 1  . LORazepam (ATIVAN) 2 MG tablet Take 1 tablet (2 mg total) by mouth every 6 (six) hours as needed for anxiety (panic, agitation). 30 tablet 1  . Melatonin 10 MG TABS Take 10 mg by mouth daily. Take at sundown every night    . Multiple Vitamins-Minerals (MULTIVITAMIN PO) Take 1 tablet by mouth daily.    . busPIRone (BUSPAR) 10 MG tablet Take 1 tablet (10 mg total) by mouth 3 (three) times daily. 90 tablet 2   No current facility-administered medications for this visit.      Musculoskeletal: Strength & Muscle Tone: within normal limits Gait & Station: normal Patient leans: N/A  Psychiatric Specialty Exam: Review of Systems  Psychiatric/Behavioral: The patient is nervous/anxious.   All other systems reviewed and are negative.   Blood pressure 104/64, pulse 67, height 5' 2.75" (1.594 m), weight 150 lb (68 kg).Body mass index is 26.78 kg/m.  General Appearance: Bizarre, Casual and Fairly Groomed  Eye Contact:  Fair  Speech:  Clear and Coherent  Volume:  Normal  Mood:  Anxious and Dysphoric  Affect:  Constricted  Thought Process:  Goal Directed  Orientation:  Full (Time, Place, and Person)  Thought Content: Obsessions and Rumination   Suicidal Thoughts:  No  Homicidal Thoughts:  No  Memory: Recent and immediate  are good remote not tested  Judgement:  Fair  Insight:  Lacking  Psychomotor Activity:  Restlessness and Tremor  Concentration:  Concentration: Fair and Attention Span: Fair  Recall:  Good  Fund of Knowledge: Good  Language: Good  Akathisia:  No  Handed:  Right  AIMS (if indicated): not done  Assets:  Communication Skills Desire for Improvement Physical Health Resilience Social Support Talents/Skills  ADL's:  Intact  Cognition: WNL  Sleep:  Good   Screenings:   Assessment and Plan: This patient is a 22 year old female with a history of seizures and high functioning autistic disorder.  Currently her diet he is so debilitating that she is often unable to meet her goals.  She is working with a therapist on this but medications may  also be helpful.  In addition to the Prozac, Abilify and Lamictal she will continue Ativan 2 mg but uses daily if needed.  We will also initiate BuSpar 10 mg 3 times daily to bring down her general level of anxiety and at that point she may not need as much Ativan.  She will return for follow-up with Dr. Rene Kocher as already scheduled or call sooner if needed   Diannia Ruder, MD 06/21/2017, 8:55 AM

## 2017-06-23 MED FILL — busPIRone HCL 10 MG TABS: 10 | 30 days supply | Qty: 90 | Fill #0

## 2017-06-23 MED FILL — LORazepam 2 MG TABS: 2 | 8 days supply | Qty: 30 | Fill #0

## 2017-06-24 ENCOUNTER — Telehealth (HOSPITAL_COMMUNITY): Payer: Self-pay

## 2017-06-24 ENCOUNTER — Telehealth (HOSPITAL_COMMUNITY): Payer: Self-pay | Admitting: *Deleted

## 2017-06-24 NOTE — Telephone Encounter (Signed)
We can offer vistaril 25 mg capsule 3 times daily until buspar is available

## 2017-06-24 NOTE — Telephone Encounter (Signed)
Prior authorization for Buspar received. Called [place] [facility] was told that pharmacy needs to enter an over ride code "[id number]" in the submission clarification field. Called to notify pharmacy and it went through without problems.

## 2017-06-24 NOTE — Telephone Encounter (Signed)
Patient is calling, she came and saw Dr. Tenny Crawoss on Saturday - Dr Tenny Crawoss gave her Buspar. Buspar is on back order everywhere and will not be available until March or April. Please review and advise, thank you

## 2017-06-26 NOTE — Telephone Encounter (Signed)
I called patient back and she was able to pick up Buspar after all, so she does not need the Vistaril.

## 2017-06-29 ENCOUNTER — Encounter (HOSPITAL_COMMUNITY): Payer: Self-pay | Admitting: Psychiatry

## 2017-06-30 ENCOUNTER — Encounter (HOSPITAL_COMMUNITY): Payer: Self-pay | Admitting: Emergency Medicine

## 2017-06-30 ENCOUNTER — Ambulatory Visit (HOSPITAL_COMMUNITY)
Admission: EM | Admit: 2017-06-30 | Discharge: 2017-06-30 | Disposition: A | Discharge status: Home / Self Care | Payer: Medicaid Other | Attending: Emergency Medicine | Admitting: Emergency Medicine

## 2017-06-30 DIAGNOSIS — F419 Anxiety disorder, unspecified: Secondary | ICD-10-CM

## 2017-06-30 DIAGNOSIS — F418 Other specified anxiety disorders: Secondary | ICD-10-CM

## 2017-06-30 DIAGNOSIS — J399 Disease of upper respiratory tract, unspecified: Secondary | ICD-10-CM

## 2017-06-30 DIAGNOSIS — F840 Autistic disorder: Secondary | ICD-10-CM | POA: Diagnosis not present

## 2017-06-30 DIAGNOSIS — Z3202 Encounter for pregnancy test, result negative: Secondary | ICD-10-CM

## 2017-06-30 DIAGNOSIS — R110 Nausea: Secondary | ICD-10-CM | POA: Diagnosis not present

## 2017-06-30 DIAGNOSIS — F32A Depression, unspecified: Secondary | ICD-10-CM

## 2017-06-30 DIAGNOSIS — F329 Major depressive disorder, single episode, unspecified: Secondary | ICD-10-CM

## 2017-06-30 LAB — POCT PREGNANCY, URINE: Preg Test, Ur: NEGATIVE

## 2017-06-30 NOTE — Discharge Instructions (Addendum)
Let's try a titration of your new medication buspar to see if this improves your nausea. Today take 5mg  at your afternoon and evening doses. For the next 3 days take 5mg  three times a day. Then for three days take 10mg  am, 5mg  afternoon and 5mg  at night. Then for three days 10mg  am, 10mg  afternoon and 5mg  at night.  Then take full prescribed 10mg  three times a day. Zofran as needed for nausea. Please continue to follow with psychiatry for recheck. You may try taking Zyrtec daily to help with congestion symptoms.

## 2017-06-30 NOTE — ED Provider Notes (Signed)
[facility]    CSN: [id number] Arrival date & time: 06/30/17  1005     History   Chief Complaint Chief Complaint  Patient presents with  . Nausea    HPI Jill Alexander is a 22 y.o. female.   Jill Alexander presents with complaints of nausea which started approximately 2/28 after starting Buspar. This was prescribed by psychiatry for depression and anxiety. She currently is taking prozac, abilify, lamictal, and ativan but still with anxiety. History of autism spectrum. She is new to the area as of July 2018. She states she has since had constant nausea. She has tried taking zofran for this which did not help. On two days ago she skipped one dose of buspar which did not seem to help with symptoms. Denies any vomiting. She is still eating and drinking. Recent constipation which has been improved with colace and miralax, had a BM today. Denies vaginal symptoms or urinary symptoms. She states she has had congestion for the past week. Denies cough, sore throat, fevers or ear pain. Denies abdominal pain. Her next psychiatry appointment is in 1 month. She states she still feels anxious, states is has been [place] from [place].     ROS per HPI.       Past Medical History:  Diagnosis Date  . Anxiety   . Autism   . Depression   . Seizures (HCC)   . 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 Seizures (HCC) 04/09/2017  . 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 NO PAST SURGERIES      OB History    No data available       Home Medications    Prior to Admission medications   Medication Sig Start Date End Date Taking? Authorizing Provider  Acetaminophen (MIDOL PO) Take by mouth.    [provider]  ARIPiprazole (ABILIFY) 5 MG tablet Take 1 tablet (5 mg total) by mouth daily. 05/21/17   Eksir, Bo Mcclintock, MD  busPIRone (BUSPAR) 10 MG tablet Take 1  tablet (10 mg total) by mouth 3 (three) times daily. 06/21/17   Myrlene Broker, MD  FLUoxetine (PROZAC) 20 MG capsule Take 1 capsule (20 mg total) by mouth daily for 14 days, THEN 2 capsules (40 mg total) daily. 05/21/17 09/02/17  Burnard Leigh, MD  Ibuprofen (MOTRIN PO) Take 1 Dose by mouth as needed.    [provider]  lamoTRIgine (LAMICTAL) 200 MG tablet Take 1 tablet (200 mg total) by mouth daily. 05/21/17   Burnard Leigh, MD  levETIRAcetam (KEPPRA) 250 MG tablet Take 1 tablet (250 mg total) by mouth 2 (two) times daily. 06/12/17   York Spaniel, MD  LORazepam (ATIVAN) 2 MG tablet Take 1 tablet (2 mg total) by mouth every 6 (six) hours as needed for anxiety (panic, agitation). 06/21/17   Myrlene Broker, MD  Melatonin 10 MG TABS Take 10 mg by mouth daily. Take at sundown every night 05/21/17   Eksir, Bo Mcclintock, MD  Multiple Vitamins-Minerals (MULTIVITAMIN PO) Take 1 tablet by mouth daily.    [provider]    Family History Family History  Problem Relation Age of Onset  . Depression Father   . Anxiety disorder Sister   . Bipolar disorder Sister     Social History Social History   Tobacco Use  . Smoking status: Never Smoker  .  Smokeless tobacco: Never Used  Substance Use Topics  . Alcohol use: No  . Drug use: No     Allergies   Patient has no known allergies.   Review of Systems Review of Systems   Physical Exam Triage Vital Signs ED Triage Vitals  Enc Vitals Group     BP 06/30/17 1051 119/72     Pulse Rate 06/30/17 1051 87     Resp 06/30/17 1051 18     Temp 06/30/17 1051 98.5 F (36.9 C)     Temp Source 06/30/17 1051 Oral     SpO2 06/30/17 1051 100 %     Weight --      Height --      Head Circumference --      Peak Flow --      Pain Score 06/30/17 1052 0     Pain Loc --      Pain Edu? --      Excl. in GC? --    No data found.  Updated Vital Signs BP 119/72 (BP Location: Right Arm)   Pulse 87   Temp 98.5 F (36.9  C) (Oral)   Resp 18   SpO2 100%   Visual Acuity Right Eye Distance:   Left Eye Distance:   Bilateral Distance:    Right Eye Near:   Left Eye Near:    Bilateral Near:     Physical Exam  Constitutional: She is oriented to person, place, and time. She appears well-developed and well-nourished. No distress.  HENT:  Head: Normocephalic.  Right Ear: Tympanic membrane, external ear and ear canal normal.  Left Ear: Tympanic membrane, external ear and ear canal normal.  Nose: Rhinorrhea present.  Mouth/Throat: Uvula is midline, oropharynx is clear and moist and mucous membranes are normal.  Cardiovascular: Normal rate, regular rhythm and normal heart sounds.  Pulmonary/Chest: Effort normal and breath sounds normal.  Abdominal: Soft. She exhibits no mass. There is no tenderness. There is no rebound and no guarding.  Neurological: She is alert and oriented to person, place, and time.  Skin: Skin is warm and dry.  Psychiatric: She has a normal mood and affect. Her behavior is normal. Judgment and thought content normal.     UC Treatments / Results  Labs (all labs ordered are listed, but only abnormal results are displayed) Labs Reviewed  POCT PREGNANCY, URINE    EKG  EKG Interpretation None       Radiology No results found.  Procedures Procedures (including critical care time)  Medications Ordered in UC Medications - No data to display   Initial Impression / Assessment and Plan / UC Course  I have reviewed the triage vital signs and the nursing notes.  Pertinent labs & imaging results that were available during my care of the patient were reviewed by me and considered in my medical decision making (see chart for details).     Benign physical findings. Without abdominal pain. Eating and drinking. Nausea started with initiation of Buspar. Discussed with patient that we can try a titration of this medication to see if symptoms improve, will cut down for 3 days and  increased every 3 days. zofran as needed. Encouraged close follow up with psychiatry for further management. Patient verbalized understanding and agreeable to plan.    Final Clinical Impressions(s) / UC Diagnoses   Final diagnoses:  Nausea  Anxiety and depression  Autism spectrum disorder  Upper respiratory disease    ED Discharge Orders    None  Controlled Substance Prescriptions [place] Controlled Substance Registry consulted? Not Applicable   Georgetta HaberBurky, Presli Fanguy B, NP 06/30/17 1148

## 2017-06-30 NOTE — ED Triage Notes (Signed)
Pt here for nausea and URI sx x several days since starting new med buspar

## 2017-07-11 ENCOUNTER — Other Ambulatory Visit (HOSPITAL_COMMUNITY): Payer: Self-pay

## 2017-07-11 DIAGNOSIS — F840 Autistic disorder: Secondary | ICD-10-CM

## 2017-07-11 DIAGNOSIS — F332 Major depressive disorder, recurrent severe without psychotic features: Secondary | ICD-10-CM

## 2017-07-30 ENCOUNTER — Ambulatory Visit (INDEPENDENT_AMBULATORY_CARE_PROVIDER_SITE_OTHER): Payer: Medicaid Other | Admitting: Psychiatry

## 2017-07-30 ENCOUNTER — Encounter (HOSPITAL_COMMUNITY): Payer: Self-pay | Admitting: Psychiatry

## 2017-07-30 ENCOUNTER — Ambulatory Visit (HOSPITAL_COMMUNITY): Payer: Self-pay | Admitting: Psychiatry

## 2017-07-30 DIAGNOSIS — F4010 Social phobia, unspecified: Secondary | ICD-10-CM | POA: Diagnosis not present

## 2017-07-30 DIAGNOSIS — F840 Autistic disorder: Secondary | ICD-10-CM

## 2017-07-30 DIAGNOSIS — Z818 Family history of other mental and behavioral disorders: Secondary | ICD-10-CM

## 2017-07-30 DIAGNOSIS — F332 Major depressive disorder, recurrent severe without psychotic features: Secondary | ICD-10-CM

## 2017-07-30 DIAGNOSIS — F429 Obsessive-compulsive disorder, unspecified: Secondary | ICD-10-CM

## 2017-07-30 DIAGNOSIS — R0683 Snoring: Secondary | ICD-10-CM | POA: Diagnosis not present

## 2017-07-30 DIAGNOSIS — G4719 Other hypersomnia: Secondary | ICD-10-CM

## 2017-07-30 DIAGNOSIS — G4730 Sleep apnea, unspecified: Secondary | ICD-10-CM | POA: Diagnosis not present

## 2017-07-30 MED ORDER — FLUOXETINE HCL 40 MG PO CAPS: 40.0000 mg | ORAL_CAPSULE | Freq: Every day | ORAL | 0 refills | Status: DC

## 2017-07-30 MED ORDER — LAMOTRIGINE 200 MG PO TABS: 200.0000 mg | ORAL_TABLET | Freq: Every day | ORAL | 1 refills | Status: DC

## 2017-07-30 MED ORDER — CLONAZEPAM 1 MG PO TABS: 1.0000 mg | ORAL_TABLET | Freq: Three times a day (TID) | ORAL | 3 refills | Status: DC

## 2017-07-30 NOTE — Progress Notes (Signed)
[facility] MD/PA/NP OP Progress Note  07/30/2017 1:48 PM Cymone Yeske  MRN:  [id number]  Chief Complaint: med management follow-up  HPI: Navie Lamoreaux reports that she restarted the BuSpar, after being started on it by Dr. Tenny Craw, then stopping it due to nausea, then going to the emergency department for nausea, but then her anxiety returned so she restarted it once again.  I suggested we work  to reduce her medication list, and circumscribe her medications to things that we know to be more effective for her.  She has generally responded well to benzodiazepines, and we reviewed the risk of habit forming, and sedation, reduction of cognition.   Needless to say, her quality of life is quite negatively affected by her anxiety and depression and irritability.  We continue to believe that Abilify is a contributing factor, and have tapered to 5 mg.  We agreed to discontinue Abilify completely.  We also agreed to discontinue BuSpar given that it continues to cause nausea without any anxiety reduction and benefits.  She uses Ativan 1-2 mg every 6 hours as needed, and I suggested we use a longer acting benzodiazepine on a scheduled basis to reduce her anxiety.  We agreed to initiate clonazepam 1 mg 3 times a day, and to continue the current dose of Prozac 40 mg and Lamictal 200 mg.  Seeing therapist twice per week. Discussed triggers, which continue to be unclear.   Patient denies any acute self-harm thoughts, but does feel quite angry and irritable, easily agitated, snapped at mom a few times in the room. She continues to struggle with coping strategies in the face of anxiety and agitation.  I suggested participation in the [facility] and she was quite receptive to this.  She and mom agreed to meet with [facility] providers today for arranging an assessment.  Notably, she wakes up with a headache almost every day.  Discussing with mom, she is a very heavy snorer, and mom has been concerned about episodes of apnea and  choking and coughing sensations.  I suggested we proceed with polysomnography and they were agreeable to this study.  Visit Diagnosis:    ICD-10-CM   1. Obsessive-compulsive disorder, unspecified type F42.9 clonazePAM (KLONOPIN) 1 MG tablet  2. Severe episode of recurrent major depressive disorder, without psychotic features (HCC) F33.2 FLUoxetine (PROZAC) 40 MG capsule    lamoTRIgine (LAMICTAL) 200 MG tablet    clonazePAM (KLONOPIN) 1 MG tablet    Split night study  3. Autism spectrum disorder F84.0 FLUoxetine (PROZAC) 40 MG capsule    lamoTRIgine (LAMICTAL) 200 MG tablet  4. Social anxiety disorder F40.10 clonazePAM (KLONOPIN) 1 MG tablet  5. Sleep-disordered breathing G47.30 Split night study  6. Snoring R06.83 Split night study  7. Excessive daytime sleepiness G47.19 Split night study    Past Psychiatric History: See intake H&P for full details. Reviewed, with no updates at this time.   Past Medical History:  Past Medical History:  Diagnosis Date  . Anxiety   . Autism   . Depression   . Seizures (HCC)   . 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 NO PAST SURGERIES      Family Psychiatric History: See intake H&P for full details. Reviewed, with no updates at this time.   Family History:  Family History  Problem Relation Age of Onset  . Depression Father   . Anxiety disorder Sister   . Bipolar disorder Sister  Social History:  Social History   Socioeconomic History  . Marital status: Single    Spouse name: Not on file  . Number of children: Not on file  . Years of education: Not on file  . Highest education level: Not on file  Occupational History  . Not on file  Social Needs  . Financial resource strain: Not on file  . Food insecurity:    Worry: Not on file    Inability: Not on file  . Transportation needs:    Medical: Not on file    Non-medical: Not on file  Tobacco Use  . Smoking status: Never Smoker   . Smokeless tobacco: Never Used  Substance and Sexual Activity  . Alcohol use: No  . Drug use: No  . Sexual activity: Never  Lifestyle  . Physical activity:    Days per week: Not on file    Minutes per session: Not on file  . Stress: Not on file  Relationships  . Social connections:    Talks on phone: Not on file    Gets together: Not on file    Attends religious service: Not on file    Active member of club or organization: Not on file    Attends meetings of clubs or organizations: Not on file    Relationship status: Not on file  Other Topics Concern  . Not on file  Social History Narrative   Lives with parents   Caffeine use: 2-3 cups per day   Left handed     Allergies: No Known Allergies  Metabolic Disorder Labs: No results found for: HGBA1C, MPG No results found for: PROLACTIN No results found for: CHOL, TRIG, HDL, CHOLHDL, VLDL, LDLCALC No results found for: TSH  Therapeutic Level Labs: No results found for: LITHIUM No results found for: VALPROATE No components found for:  CBMZ  Current Medications: Current Outpatient Medications  Medication Sig Dispense Refill  . Acetaminophen (MIDOL PO) Take by mouth.    . clonazePAM (KLONOPIN) 1 MG tablet Take 1 tablet (1 mg total) by mouth 3 (three) times daily. 90 tablet 3  . FLUoxetine (PROZAC) 40 MG capsule Take 1 capsule (40 mg total) by mouth daily. 90 capsule 0  . Ibuprofen (MOTRIN PO) Take 1 Dose by mouth as needed.    . lamoTRIgine (LAMICTAL) 200 MG tablet Take 1 tablet (200 mg total) by mouth daily. 90 tablet 1  . levETIRAcetam (KEPPRA) 250 MG tablet Take 1 tablet (250 mg total) by mouth 2 (two) times daily. 180 tablet 1  . Melatonin 10 MG TABS Take 10 mg by mouth daily. Take at sundown every night    . Multiple Vitamins-Minerals (MULTIVITAMIN PO) Take 1 tablet by mouth daily.     No current facility-administered medications for this visit.      Musculoskeletal: Strength & Muscle Tone: within normal  limits Gait & Station: normal Patient leans: N/A  Psychiatric Specialty Exam: ROS  There were no vitals taken for this visit.There is no height or weight on file to calculate BMI.  General Appearance: Casual and Fairly Groomed  Eye Contact:  Fair  Speech:  Clear and Coherent and Normal Rate  Volume:  Normal  Mood:  Anxious and Dysphoric  Affect:  Appropriate and Congruent  Thought Process:  Goal Directed and Descriptions of Associations: Intact  Orientation:  Full (Time, Place, and Person)  Thought Content: Logical   Suicidal Thoughts:  No  Homicidal Thoughts:  No  Memory:  Immediate;  Fair  Judgement:  Fair  Insight:  Fair  Psychomotor Activity:  Normal  Concentration:  Concentration: Good  Recall:  Good  Fund of Knowledge: Good  Language: Good  Akathisia:  Negative  Handed:  Right  AIMS (if indicated): not done  Assets:  Desire for Improvement Housing Leisure Time Social Support Transportation  ADL's:  Intact  Cognition: WNL  Sleep:  Fair   Screenings:   Assessment and Plan:  Vangie BickerMadeline Sarah Swords presents with ongoing anxiety agitation, irritability.  No overt aggressive behaviors, just verbal irritability, is rude to her mother, feels easily frustrated with herself.  No acute safety issues or self-harm, or suicidality.  She has a positive coping strategies and has difficulty utilizing her few coping strategies during periods of anger and irritability.  She feels quite depressed and discouraged and stuck in terms of her work/school life.  She continues to have difficulty adjusting to the [place].  She is participating in individual therapy, but I feel that she would benefit from a higher level of care including participation in the partial hospitalization program.  I am concerned that if she continues to have a deterioration of her mood and anxiety she may require psychiatric hospitalization for more rapid medication adjustment.  She has good support  from her mother at home, does not engage in any substance abuse.  We agreed to continue Prozac and Lamictal, and initiate clonazepam 1 mg 3 times daily for anxiety and agitation.  I continue to be concerned that Abilify is causing some level of akathisia and irritability so we agreed to discontinue.  BuSpar has not been particularly helpful so we agreed to discontinue as well.  We will follow-up in 8 weeks or sooner if needed.  1. Obsessive-compulsive disorder, unspecified type   2. Severe episode of recurrent major depressive disorder, without psychotic features (HCC)   3. Autism spectrum disorder   4. Social anxiety disorder   5. Sleep-disordered breathing   6. Snoring   7. Excessive daytime sleepiness     Status of current problems: unchanged  Labs Ordered: Orders Placed This Encounter  Procedures  . Split night study    Standing Status:   Future    Standing Expiration Date:   07/31/2018    Order Specific Question:   Where should this test be performed:    Answer:   [facility]    Labs Reviewed: n/a  Collateral Obtained/Records Reviewed: mom is present as above  Plan:  Refer for polysomnography split-night study for suspected sleep apnea Refer for PHP at this office given increased level of needs Continue Prozac 40 mg daily Continue Lamictal 200 mg daily Initiate clonazepam 1 mg 3 times daily Discontinue Ativan in favor of clonazepam Discontinue Abilify and BuSpar given lack of benefit and possible exacerbation of mood  I spent 25 minutes with the patient in direct face-to-face clinical care.  Greater than 50% of this time was spent in counseling and coordination of care with the patient.    Burnard LeighAlexander Arya Shilynn Hoch, MD 07/30/2017, 1:48 PM

## 2017-07-31 ENCOUNTER — Telehealth (HOSPITAL_COMMUNITY): Payer: Self-pay | Admitting: Professional

## 2017-08-01 ENCOUNTER — Encounter (HOSPITAL_COMMUNITY): Payer: Self-pay | Admitting: Psychiatry

## 2017-08-01 NOTE — Addendum Note (Signed)
Addended by: Angus PalmsEKSIR, [place] Wherry A on: 08/01/2017 08:13 AM   Modules accepted: Orders

## 2017-08-04 ENCOUNTER — Encounter (HOSPITAL_COMMUNITY): Payer: Self-pay | Admitting: Psychiatry

## 2017-08-04 ENCOUNTER — Other Ambulatory Visit (HOSPITAL_COMMUNITY): Payer: Medicaid Other | Attending: Psychiatry | Admitting: Professional

## 2017-08-04 ENCOUNTER — Telehealth (HOSPITAL_COMMUNITY): Payer: Self-pay | Admitting: Professional

## 2017-08-04 ENCOUNTER — Other Ambulatory Visit (HOSPITAL_COMMUNITY): Payer: Self-pay | Admitting: Psychiatry

## 2017-08-04 DIAGNOSIS — F332 Major depressive disorder, recurrent severe without psychotic features: Secondary | ICD-10-CM

## 2017-08-04 MED ORDER — LORAZEPAM 2 MG PO TABS: 2.0000 mg | ORAL_TABLET | Freq: Three times a day (TID) | ORAL | 0 refills | Status: DC | PRN

## 2017-08-04 NOTE — Progress Notes (Signed)
Pt came in for CCA for PHP. Cln discussed insurance with pt prior to completing CCA. Pt has Medicaid and is unable to pay out of pocket for group. Cln provided pt with [facility] to get services for free.  Tamala Manzer, LPCA, LCASA

## 2017-08-06 ENCOUNTER — Encounter (HOSPITAL_COMMUNITY): Payer: Self-pay | Admitting: Psychiatry

## 2017-08-07 ENCOUNTER — Ambulatory Visit (INDEPENDENT_AMBULATORY_CARE_PROVIDER_SITE_OTHER): Payer: Medicaid Other | Admitting: Psychiatry

## 2017-08-07 ENCOUNTER — Encounter (HOSPITAL_COMMUNITY): Payer: Self-pay | Admitting: Psychiatry

## 2017-08-07 VITALS — BP 122/72 | HR 68 | Ht 63.0 in | Wt 143.0 lb

## 2017-08-07 DIAGNOSIS — F840 Autistic disorder: Secondary | ICD-10-CM

## 2017-08-07 DIAGNOSIS — F4010 Social phobia, unspecified: Secondary | ICD-10-CM

## 2017-08-07 DIAGNOSIS — Z818 Family history of other mental and behavioral disorders: Secondary | ICD-10-CM

## 2017-08-07 DIAGNOSIS — G4730 Sleep apnea, unspecified: Secondary | ICD-10-CM

## 2017-08-07 DIAGNOSIS — F429 Obsessive-compulsive disorder, unspecified: Secondary | ICD-10-CM

## 2017-08-07 DIAGNOSIS — F332 Major depressive disorder, recurrent severe without psychotic features: Secondary | ICD-10-CM | POA: Diagnosis not present

## 2017-08-07 MED ORDER — LORAZEPAM 2 MG PO TABS: ORAL_TABLET | ORAL | 0 refills | Status: DC

## 2017-08-07 MED ORDER — BREXPIPRAZOLE 0.5 MG PO TABS: 0.5000 mg | ORAL_TABLET | ORAL | 0 refills | Status: DC

## 2017-08-07 NOTE — Progress Notes (Signed)
BH MD/PA/NP OP Progress Note  08/07/2017 4:40 PM Jill Alexander  MRN:  [id number]  Chief Complaint: anxiety, agitation HPI: Jill Alexander presents with ongoing depression, anxiety, agitation, hopelessness.  Denies any suicidal thoughts or aggressive behaviors at home.  She continues on Prozac and Lamictal.  We discussed adding a low-dose of Rexulti as an augmentation strategy.  Abilify had been helpful but caused a lot of sedation, so we are hopeful that Rexulti may have some reduced side effect profile for her.  In the meantime, she can continue Ativan up to 4 times a day for agitation or anxiety, which she reports has been helpful in calming when needed.. She is generally taking half of the tablet when needed.  She is supposed to be in individual therapy, but her individual therapist continues to cancel her appointments for health issues and family issues, so we agreed to make a referral for her to see a provider in this office that can see her on a more consistent basis  Visit Diagnosis:    ICD-10-CM   1. Severe episode of recurrent major depressive disorder, without psychotic features (HCC) F33.2   2. Autism spectrum disorder F84.0   3. Obsessive-compulsive disorder, unspecified type F42.9   4. Social anxiety disorder F40.10   5. Sleep-disordered breathing G47.30     Past Psychiatric History: See intake H&P for full details. Reviewed, with no updates at this time.   Past Medical History:  Past Medical History:  Diagnosis Date  . Anxiety   . Autism   . Depression   . Seizures (HCC)   . 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 NO PAST SURGERIES      Family Psychiatric History: See intake H&P for full details. Reviewed, with no updates at this time.   Family History:  Family History  Problem Relation Age of Onset  . Depression Father   . Anxiety disorder Sister   . Bipolar disorder Sister     Social History:   Social History   Socioeconomic History  . Marital status: Single    Spouse name: Not on file  . Number of children: Not on file  . Years of education: Not on file  . Highest education level: Not on file  Occupational History  . Not on file  Social Needs  . Financial resource strain: Not on file  . Food insecurity:    Worry: Not on file    Inability: Not on file  . Transportation needs:    Medical: Not on file    Non-medical: Not on file  Tobacco Use  . Smoking status: Never Smoker  . Smokeless tobacco: Never Used  Substance and Sexual Activity  . Alcohol use: No  . Drug use: No  . Sexual activity: Never  Lifestyle  . Physical activity:    Days per week: Not on file    Minutes per session: Not on file  . Stress: Not on file  Relationships  . Social connections:    Talks on phone: Not on file    Gets together: Not on file    Attends religious service: Not on file    Active member of club or organization: Not on file    Attends meetings of clubs or organizations: Not on file    Relationship status: Not on file  Other Topics Concern  . Not on file  Social History Narrative   Lives with parents   Caffeine use:  2-3 cups per day   Left handed     Allergies: No Known Allergies  Metabolic Disorder Labs: No results found for: HGBA1C, MPG No results found for: PROLACTIN No results found for: CHOL, TRIG, HDL, CHOLHDL, VLDL, LDLCALC No results found for: TSH  Therapeutic Level Labs: No results found for: LITHIUM No results found for: VALPROATE No components found for:  CBMZ  Current Medications: Current Outpatient Medications  Medication Sig Dispense Refill  . Acetaminophen (MIDOL PO) Take by mouth.    Marland Kitchen FLUoxetine (PROZAC) 40 MG capsule Take 1 capsule (40 mg total) by mouth daily. 90 capsule 0  . Ibuprofen (MOTRIN PO) Take 1 Dose by mouth as needed.    . lamoTRIgine (LAMICTAL) 200 MG tablet Take 1 tablet (200 mg total) by mouth daily. 90 tablet 1  .  levETIRAcetam (KEPPRA) 250 MG tablet Take 1 tablet (250 mg total) by mouth 2 (two) times daily. 180 tablet 1  . LORazepam (ATIVAN) 2 MG tablet Take up to 4 times a day for anxiety and panic 60 tablet 0  . Melatonin 10 MG TABS Take 10 mg by mouth daily. Take at sundown every night    . Multiple Vitamins-Minerals (MULTIVITAMIN PO) Take 1 tablet by mouth daily.    . Brexpiprazole (REXULTI) 0.5 MG TABS Take 1 tablet (0.5 mg total) by mouth every morning. 30 tablet 0   No current facility-administered medications for this visit.      Musculoskeletal: Strength & Muscle Tone: within normal limits Gait & Station: normal Patient leans: N/A  Psychiatric Specialty Exam: ROS  Blood pressure 122/72, pulse 68, height 5\' 3"  (1.6 m), weight 143 lb (64.9 kg).Body mass index is 25.33 kg/m.  General Appearance: Casual and Fairly Groomed  Eye Contact:  Fair  Speech:  Clear and Coherent and Normal Rate  Volume:  Normal  Mood:  Anxious, Depressed and Dysphoric  Affect:  Appropriate and Congruent  Thought Process:  Goal Directed and Descriptions of Associations: Intact  Orientation:  Full (Time, Place, and Person)  Thought Content: Logical   Suicidal Thoughts:  No  Homicidal Thoughts:  No  Memory:  Immediate;   Fair  Judgement:  Fair  Insight:  Fair  Psychomotor Activity:  Normal  Concentration:  Attention Span: Fair  Recall:  [employer] of Knowledge: Fair  Language: Fair  Akathisia:  Negative  Handed:  Right  AIMS (if indicated): not done  Assets:  Desire for Improvement Financial Resources/Insurance Housing Social Support Transportation  ADL's:  Intact  Cognition: WNL  Sleep:  Good   Screenings:   Assessment and Plan: Jill Alexander presents with ongoing anxiety, irritability, depression, and anhedonia.  Since I have seen her in January, we have been able to taper her off of multiple medications and simplify her regimen to Prozac and Lamictal.  Abilify was helping with mood  stabilization, but caused significant sedation, so we agreed to taper and discontinue.  Ultimately, I do think she would benefit from an antipsychotic so we agreed to restart with Rexulti given the reduced side effect profile.  I will see her on a weekly basis until we can find some improved stability.  She will also start with a new therapist in this office.  She does not present with any suicidality or unsafe behaviors.  She has good support from her mom and family.  1. Severe episode of recurrent major depressive disorder, without psychotic features (HCC)   2. Autism spectrum disorder   3. Obsessive-compulsive disorder,  unspecified type   4. Social anxiety disorder   5. Sleep-disordered breathing     Status of current problems: unchanged  Labs Ordered: No orders of the defined types were placed in this encounter.   Labs Reviewed: n/a  Collateral Obtained/Records Reviewed: mom is present and able to corroborate that Jill Alexander has definitely not been has tired, but she still is struggling with depression and anxiety and tearfulness and frustration about her feeling of being paralyzed about what to do with her life/career/school  Plan:  Continue Prozac 40 mg daily Continue Lamictal 200 mg daily Initiate Rexulti 0.5 mg x 1 week, then increase to 1 mg daily Return to clinic weekly for the next 5-7 weeks [facility] and [facility]  I spent 20 minutes with the patient in direct face-to-face clinical care.  Greater than 50% of this time was spent in counseling and coordination of care with the patient.    Burnard LeighAlexander Arya Tyshia Fenter, MD 08/07/2017, 4:40 PM

## 2017-08-12 ENCOUNTER — Encounter (HOSPITAL_COMMUNITY): Payer: Self-pay | Admitting: Psychiatry

## 2017-08-13 ENCOUNTER — Encounter (HOSPITAL_COMMUNITY): Payer: Self-pay | Admitting: Psychiatry

## 2017-08-18 ENCOUNTER — Ambulatory Visit (INDEPENDENT_AMBULATORY_CARE_PROVIDER_SITE_OTHER): Payer: Medicaid Other | Admitting: Psychiatry

## 2017-08-18 ENCOUNTER — Encounter (HOSPITAL_COMMUNITY): Payer: Self-pay | Admitting: Psychiatry

## 2017-08-18 VITALS — BP 102/66 | HR 65 | Ht 62.25 in | Wt 141.0 lb

## 2017-08-18 DIAGNOSIS — F4010 Social phobia, unspecified: Secondary | ICD-10-CM | POA: Diagnosis not present

## 2017-08-18 DIAGNOSIS — F332 Major depressive disorder, recurrent severe without psychotic features: Secondary | ICD-10-CM

## 2017-08-18 DIAGNOSIS — F429 Obsessive-compulsive disorder, unspecified: Secondary | ICD-10-CM

## 2017-08-18 DIAGNOSIS — Z818 Family history of other mental and behavioral disorders: Secondary | ICD-10-CM

## 2017-08-18 MED ORDER — LORAZEPAM 2 MG PO TABS: 2.0000 mg | ORAL_TABLET | Freq: Two times a day (BID) | ORAL | 0 refills | Status: DC | PRN

## 2017-08-18 MED ORDER — BREXPIPRAZOLE 2 MG PO TABS: 2.0000 mg | ORAL_TABLET | ORAL | 0 refills | Status: DC

## 2017-08-18 NOTE — Progress Notes (Signed)
BH MD/PA/NP OP Progress Note  08/18/2017 12:26 PM Jill Alexander  MRN:  [id number]  Chief Complaint: A little better HPI: Jill BickerMadeline Sarah Hartog reports that she does tend to message writer whenever she has feelings, but things might get better on their own.  She reports that over the last few days, her anxiety is started to go down.  She is tolerating the Rexulti without issue.  We agreed to increase to 2 mg for maintenance dose.  She used only half a tablet of Ativan 2 mg yesterday, but was able to get through the day okay otherwise.  We will follow-up in 1 week as scheduled and [facility] is [facility] in the next couple weeks.  Visit Diagnosis:    ICD-10-CM   1. Severe episode of recurrent major depressive disorder, without psychotic features (HCC) F33.2 Brexpiprazole (REXULTI) 2 MG TABS  2. Obsessive-compulsive disorder, unspecified type F42.9 Brexpiprazole (REXULTI) 2 MG TABS  3. Social anxiety disorder F40.10 Brexpiprazole (REXULTI) 2 MG TABS    Past Psychiatric History: See intake H&P for full details. Reviewed, with no updates at this time.   Past Medical History:  Past Medical History:  Diagnosis Date  . Anxiety   . Autism   . Depression   . Seizures (HCC)   . 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 NO PAST SURGERIES      Family Psychiatric History: See intake H&P for full details. Reviewed, with no updates at this time.   Family History:  Family History  Problem Relation Age of Onset  . Depression Father   . Anxiety disorder Sister   . Bipolar disorder Sister     Social History:  Social History   Socioeconomic History  . Marital status: Single    Spouse name: Not on file  . Number of children: Not on file  . Years of education: Not on file  . Highest education level: Not on file  Occupational History  . Not on file  Social Needs  . Financial resource strain: Not on file  . Food  insecurity:    Worry: Not on file    Inability: Not on file  . Transportation needs:    Medical: Not on file    Non-medical: Not on file  Tobacco Use  . Smoking status: Never Smoker  . Smokeless tobacco: Never Used  Substance and Sexual Activity  . Alcohol use: No  . Drug use: No  . Sexual activity: Never  Lifestyle  . Physical activity:    Days per week: Not on file    Minutes per session: Not on file  . Stress: Not on file  Relationships  . Social connections:    Talks on phone: Not on file    Gets together: Not on file    Attends religious service: Not on file    Active member of club or organization: Not on file    Attends meetings of clubs or organizations: Not on file    Relationship status: Not on file  Other Topics Concern  . Not on file  Social History Narrative   Lives with parents   Caffeine use: 2-3 cups per day   Left handed     Allergies: No Known Allergies  Metabolic Disorder Labs: No results found for: HGBA1C, MPG No results found for: PROLACTIN No results found for: CHOL, TRIG, HDL, CHOLHDL, VLDL, LDLCALC No results found for: TSH  Therapeutic Level  Labs: No results found for: LITHIUM No results found for: VALPROATE No components found for:  CBMZ  Current Medications: Current Outpatient Medications  Medication Sig Dispense Refill  . Acetaminophen (MIDOL PO) Take by mouth.    Marland Kitchen FLUoxetine (PROZAC) 40 MG capsule Take 1 capsule (40 mg total) by mouth daily. 90 capsule 0  . Ibuprofen (MOTRIN PO) Take 1 Dose by mouth as needed.    . lamoTRIgine (LAMICTAL) 200 MG tablet Take 1 tablet (200 mg total) by mouth daily. 90 tablet 1  . levETIRAcetam (KEPPRA) 250 MG tablet Take 1 tablet (250 mg total) by mouth 2 (two) times daily. 180 tablet 1  . LORazepam (ATIVAN) 2 MG tablet Take 1 tablet (2 mg total) by mouth 2 (two) times daily as needed for anxiety. 60 tablet 0  . Multiple Vitamins-Minerals (MULTIVITAMIN PO) Take 1 tablet by mouth daily.    .  Brexpiprazole (REXULTI) 2 MG TABS Take 2 mg by mouth every morning. 90 tablet 0   No current facility-administered medications for this visit.      Musculoskeletal: Strength & Muscle Tone: within normal limits Gait & Station: normal Patient leans: N/A  Psychiatric Specialty Exam: ROS  Blood pressure 102/66, pulse 65, height 5' 2.25" (1.581 m), weight 141 lb (64 kg), SpO2 98 %.Body mass index is 25.58 kg/m.  General Appearance: Casual and Fairly Groomed  Eye Contact:  Fair  Speech:  Garbled and Normal Rate  Volume:  Normal  Mood:  Anxious  Affect:  Congruent  Thought Process:  Goal Directed and Descriptions of Associations: Tangential  Orientation:  Full (Time, Place, and Person)  Thought Content: Logical   Suicidal Thoughts:  No  Homicidal Thoughts:  No  Memory:  Immediate;   Fair  Judgement:  Fair  Insight:  Fair  Psychomotor Activity:  Normal  Concentration:  Attention Span: Fair  Recall:  [employer] of Knowledge: Fair  Language: Fair  Akathisia:  Negative  Handed:  Right  AIMS (if indicated): not done  Assets:  Desire for Improvement Financial Resources/Insurance Housing Transportation  ADL's:  Intact  Cognition: WNL  Sleep:  Fair   Screenings:   Assessment and Plan:  Diasha Castleman presents with some interval improvement of depression and anxiety symptoms with augmentation using Rexulti.  We agreed to increase as below and follow-up in 1 week as scheduled.  1. Severe episode of recurrent major depressive disorder, without psychotic features (HCC)   2. Obsessive-compulsive disorder, unspecified type   3. Social anxiety disorder     Status of current problems: unchanged  Labs Ordered: No orders of the defined types were placed in this encounter.   Labs Reviewed: n/a  Collateral Obtained/Records Reviewed: n/a  Plan:  Educate patient on appropriate contacting of clinic, and learning to observe trends in her mood rather than concretely focusing  on one bad day Increase Rexulti to 2 mg Continue Prozac, Lamictal Lorazepam 2 mg twice a day as needed for anxiety  I spent 15 minutes with the patient in direct face-to-face clinical care.  Greater than 50% of this time was spent in counseling and coordination of care with the patient.    Burnard Leigh, MD 08/18/2017, 12:26 PM

## 2017-08-21 ENCOUNTER — Encounter (INDEPENDENT_AMBULATORY_CARE_PROVIDER_SITE_OTHER): Payer: Self-pay | Admitting: Physician Assistant

## 2017-08-21 ENCOUNTER — Ambulatory Visit (INDEPENDENT_AMBULATORY_CARE_PROVIDER_SITE_OTHER): Payer: Medicaid Other | Admitting: Physician Assistant

## 2017-08-21 VITALS — BP 97/62 | HR 79 | Temp 98.4°F | Ht 63.0 in | Wt 140.4 lb

## 2017-08-21 DIAGNOSIS — R0683 Snoring: Secondary | ICD-10-CM | POA: Diagnosis not present

## 2017-08-21 DIAGNOSIS — R400 Somnolence: Secondary | ICD-10-CM | POA: Diagnosis not present

## 2017-08-21 DIAGNOSIS — R5383 Other fatigue: Secondary | ICD-10-CM | POA: Diagnosis not present

## 2017-08-21 DIAGNOSIS — Z114 Encounter for screening for human immunodeficiency virus [HIV]: Secondary | ICD-10-CM

## 2017-08-21 DIAGNOSIS — R51 Headache: Secondary | ICD-10-CM

## 2017-08-21 DIAGNOSIS — G935 Compression of brain: Secondary | ICD-10-CM | POA: Diagnosis not present

## 2017-08-21 DIAGNOSIS — R519 Headache, unspecified: Secondary | ICD-10-CM

## 2017-08-21 MED ORDER — TOPIRAMATE 25 MG PO TABS: 25.0000 mg | ORAL_TABLET | Freq: Two times a day (BID) | ORAL | 2 refills | Status: DC

## 2017-08-21 NOTE — Patient Instructions (Signed)

## 2017-08-21 NOTE — Progress Notes (Signed)
Subjective:  Patient ID: Jill Alexander, female    DOB: 21-Jun-1995  Age: 22 y.o. MRN: [id number]  CC: headache, stomach ache, fatigue  HPI Jill Alexander is a 22 y.o. female with a medical history of anxiety, autism, depression, seizures, VSD, and essential tremor presents as a new patient with complaint of fatigue and "out of the blue" stomach pains and headaches. Symptoms are not necessarily associated. Onset more than one year ago. Pt attributes symptoms to change in psychiatric medications over the last six months. Mother accompanies pt and states symptoms were present before all the medications were changed. Headache is worse in the morning but somewhat persistent throughout the day. Located mostly in the central frontal region. Headache associated with occasional and mild photophobia. Takes tylenol and/or Advil with relief "sometimes". Sleep after a headache usually resolves the headache.  Does not endorse nausea/vomiting or phonophobia. MRI in 2016 reportedly revealed a chiari malformation.      Outpatient Medications Prior to Visit  Medication Sig Dispense Refill  . Acetaminophen (MIDOL PO) Take by mouth.    . Brexpiprazole (REXULTI) 2 MG TABS Take 2 mg by mouth every morning. 90 tablet 0  . FLUoxetine (PROZAC) 40 MG capsule Take 1 capsule (40 mg total) by mouth daily. 90 capsule 0  . Ibuprofen (MOTRIN PO) Take 1 Dose by mouth as needed.    . lamoTRIgine (LAMICTAL) 200 MG tablet Take 1 tablet (200 mg total) by mouth daily. 90 tablet 1  . levETIRAcetam (KEPPRA) 250 MG tablet Take 1 tablet (250 mg total) by mouth 2 (two) times daily. 180 tablet 1  . LORazepam (ATIVAN) 2 MG tablet Take 1 tablet (2 mg total) by mouth 2 (two) times daily as needed for anxiety. 60 tablet 0  . Multiple Vitamins-Minerals (MULTIVITAMIN PO) Take 1 tablet by mouth daily.     No facility-administered medications prior to visit.      ROS Review of Systems  Constitutional: Positive for  malaise/fatigue. Negative for chills and fever.  Eyes: Negative for blurred vision.  Respiratory: Negative for shortness of breath.   Cardiovascular: Negative for chest pain and palpitations.  Gastrointestinal: Negative for abdominal pain and nausea.  Genitourinary: Negative for dysuria and hematuria.  Musculoskeletal: Negative for joint pain and myalgias.  Skin: Negative for rash.  Neurological: Positive for tremors and headaches. Negative for tingling.  Psychiatric/Behavioral: Negative for depression. The patient is nervous/anxious.     Objective:  BP 97/62 (BP Location: Right Arm, Patient Position: Sitting, Cuff Size: Large)   Pulse 79   Temp 98.4 F (36.9 C) (Oral)   Ht 5\' 3"  (1.6 m)   Wt 140 lb 6.4 oz (63.7 kg)   LMP 07/28/2017 (Exact Date)   SpO2 95%   BMI 24.87 kg/m   BP/Weight 08/21/2017 06/30/2017 06/02/2017  Systolic BP 97 119 104  Diastolic BP 62 72 65  Wt. (Lbs) 140.4 - -  BMI 24.87 - -  Some encounter information is confidential and restricted. Go to Review Flowsheets activity to see all data.      Physical Exam  Constitutional: She is oriented to person, place, and time.  Well developed, well nourished, NAD, polite  HENT:  Head: Normocephalic and atraumatic.  Eyes: Pupils are equal, round, and reactive to light. No scleral icterus.  Neck: Normal range of motion. Neck supple. No thyromegaly present.  Cardiovascular: Normal rate, regular rhythm and normal heart sounds. Exam reveals no gallop and no friction rub.  No murmur heard. No LE  edema bilaterally  Pulmonary/Chest: Effort normal and breath sounds normal. No respiratory distress.  Musculoskeletal: She exhibits no edema.  Neurological: She is alert and oriented to person, place, and time. No cranial nerve deficit.  Generally tremulous  Skin: Skin is warm and dry. No rash noted. No erythema. No pallor.  Clammy palms  Psychiatric: Her behavior is normal. Thought content normal.  anxious  Vitals  reviewed.    Assessment & Plan:   1. Fatigue, unspecified type - Thyroid Panel With TSH - CBC with Differential - Vitamin D 1,25 dihydroxy - Comprehensive metabolic panel  2. Acute nonintractable headache, unspecified headache type - Begin Topiramate up to 2 tablets per night. Start with one tablet the first week.  3. Daytime somnolence - Patient in process of have home sleep study done.   4. Snoring  5. Chiari malformation type I (HCC) - Pt has appointment to see [facility] on 08/29/17  6. Screening for HIV (human immunodeficiency virus) - HIV antibody    Follow-up: Return in about 1 month (around 09/18/2017) for f/u fatigue.   Loletta Specteroger David Baeleigh Devincent PA

## 2017-08-25 ENCOUNTER — Encounter (HOSPITAL_COMMUNITY): Payer: Self-pay | Admitting: Psychiatry

## 2017-08-25 ENCOUNTER — Ambulatory Visit (INDEPENDENT_AMBULATORY_CARE_PROVIDER_SITE_OTHER): Payer: Medicaid Other | Admitting: Psychiatry

## 2017-08-25 VITALS — BP 112/68 | HR 74 | Ht 63.0 in | Wt 141.8 lb

## 2017-08-25 DIAGNOSIS — G4730 Sleep apnea, unspecified: Secondary | ICD-10-CM | POA: Diagnosis not present

## 2017-08-25 DIAGNOSIS — F840 Autistic disorder: Secondary | ICD-10-CM

## 2017-08-25 DIAGNOSIS — Z79899 Other long term (current) drug therapy: Secondary | ICD-10-CM

## 2017-08-25 DIAGNOSIS — F332 Major depressive disorder, recurrent severe without psychotic features: Secondary | ICD-10-CM | POA: Diagnosis not present

## 2017-08-25 DIAGNOSIS — F4010 Social phobia, unspecified: Secondary | ICD-10-CM

## 2017-08-25 DIAGNOSIS — F429 Obsessive-compulsive disorder, unspecified: Secondary | ICD-10-CM

## 2017-08-25 DIAGNOSIS — Z818 Family history of other mental and behavioral disorders: Secondary | ICD-10-CM

## 2017-08-25 NOTE — Progress Notes (Signed)
BH MD/PA/NP OP Progress Note  08/25/2017 12:35 PM Jill Alexander  MRN:  [id number]  Chief Complaint: okay  HPI: Jill Alexander is using ativan 1/2 tablet twice a day.  Tolerating rexulti without issue.  gradaully feeling less irritability, but some breakthrough symptoms. No si or unsafe thoughts. Agreed to maintain current regimen given recent increase, and will Rtc in 2 weeks.  Visit Diagnosis:    ICD-10-CM   1. Severe episode of recurrent major depressive disorder, without psychotic features (HCC) F33.2   2. Obsessive-compulsive disorder, unspecified type F42.9   3. Social anxiety disorder F40.10   4. Autism spectrum disorder F84.0   5. Sleep-disordered breathing G47.30     Past Psychiatric History: See intake H&P for full details. Reviewed, with no updates at this time.   Past Medical History:  Past Medical History:  Diagnosis Date  . Anxiety   . Autism   . Depression   . Seizures (HCC)   . 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 NO PAST SURGERIES      Family Psychiatric History: See intake H&P for full details. Reviewed, with no updates at this time.   Family History:  Family History  Problem Relation Age of Onset  . Depression Father   . Anxiety disorder Sister   . Bipolar disorder Sister     Social History:  Social History   Socioeconomic History  . Marital status: Single    Spouse name: Not on file  . Number of children: Not on file  . Years of education: Not on file  . Highest education level: Not on file  Occupational History  . Not on file  Social Needs  . Financial resource strain: Not on file  . Food insecurity:    Worry: Not on file    Inability: Not on file  . Transportation needs:    Medical: Not on file    Non-medical: Not on file  Tobacco Use  . Smoking status: Never Smoker  . Smokeless tobacco: Never Used  Substance and Sexual Activity  . Alcohol use: No  . Drug use: No  .  Sexual activity: Never  Lifestyle  . Physical activity:    Days per week: Not on file    Minutes per session: Not on file  . Stress: Not on file  Relationships  . Social connections:    Talks on phone: Not on file    Gets together: Not on file    Attends religious service: Not on file    Active member of club or organization: Not on file    Attends meetings of clubs or organizations: Not on file    Relationship status: Not on file  Other Topics Concern  . Not on file  Social History Narrative   Lives with parents   Caffeine use: 2-3 cups per day   Left handed     Allergies: No Known Allergies  Metabolic Disorder Labs: No results found for: HGBA1C, MPG No results found for: PROLACTIN No results found for: CHOL, TRIG, HDL, CHOLHDL, VLDL, LDLCALC Lab Results  Component Value Date   TSH 0.677 08/21/2017    Therapeutic Level Labs: No results found for: LITHIUM No results found for: VALPROATE No components found for:  CBMZ  Current Medications: Current Outpatient Medications  Medication Sig Dispense Refill  . Acetaminophen (MIDOL PO) Take by mouth.    . Brexpiprazole (REXULTI) 2 MG TABS Take 2 mg by mouth every  morning. 90 tablet 0  . FLUoxetine (PROZAC) 40 MG capsule Take 1 capsule (40 mg total) by mouth daily. 90 capsule 0  . Ibuprofen (MOTRIN PO) Take 1 Dose by mouth as needed.    . lamoTRIgine (LAMICTAL) 200 MG tablet Take 1 tablet (200 mg total) by mouth daily. 90 tablet 1  . levETIRAcetam (KEPPRA) 250 MG tablet Take 1 tablet (250 mg total) by mouth 2 (two) times daily. 180 tablet 1  . LORazepam (ATIVAN) 2 MG tablet Take 1 tablet (2 mg total) by mouth 2 (two) times daily as needed for anxiety. 60 tablet 0  . Multiple Vitamins-Minerals (MULTIVITAMIN PO) Take 1 tablet by mouth daily.    Marland Kitchen topiramate (TOPAMAX) 25 MG tablet Take 1 tablet (25 mg total) by mouth 2 (two) times daily. 30 tablet 2   No current facility-administered medications for this visit.       Musculoskeletal: Strength & Muscle Tone: within normal limits Gait & Station: normal Patient leans: N/A  Psychiatric Specialty Exam: ROS  Blood pressure 112/68, pulse 74, height  (1.6 m), weight 141 lb 12.8 oz (64.3 kg), last menstrual period 07/28/2017.Body mass index is 25.12 kg/m.  General Appearance: Casual and Fairly Groomed  Eye Contact:  Fair  Speech:  Garbled and Normal Rate  Volume:  Normal  Mood:  Euthymic and okay  Affect:  Congruent  Thought Process:  Goal Directed and Descriptions of Associations: Tangential  Orientation:  Full (Time, Place, and Person)  Thought Content: Logical   Suicidal Thoughts:  No  Homicidal Thoughts:  No  Memory:  Immediate;   Fair  Judgement:  Fair  Insight:  Fair  Psychomotor Activity:  Normal  Concentration:  Attention Span: Fair  Recall:  [employer] of Knowledge: Fair  Language: Fair  Akathisia:  Negative  Handed:  Right  AIMS (if indicated): not done  Assets:  Desire for Improvement Financial Resources/Insurance Housing Transportation  ADL's:  Intact  Cognition: WNL  Sleep:  Fair   Screenings: GAD-7     [facility] Visit from 08/21/2017 in [facility]  Total GAD-7 Score  18    PHQ2-9     Office Visit from 08/21/2017 in [facility]  PHQ-2 Total Score  6  PHQ-9 Total Score  18       Assessment and Plan:  Jill Alexander continues to have some improvements in irritability, gradually with rexulti. Will maintain current 2 mg dose, and follow-up in 2 weeks.  She is using ativan far less than initially required a few weeks ago. No acute safety issues.  Starting therapy next week.  1. Severe episode of recurrent major depressive disorder, without psychotic features (HCC)   2. Obsessive-compulsive disorder, unspecified type   3. Social anxiety disorder   4. Autism spectrum disorder   5. Sleep-disordered breathing     Status of current problems: unchanged  Labs  Ordered: No orders of the defined types were placed in this encounter.   Labs Reviewed: n/a  Collateral Obtained/Records Reviewed: n/a  Plan:  Maintain Rexulti 2 mg Continue Prozac, Lamictal Lorazepam 1-2 mg twice a day as needed for anxiety  I spent 15 minutes with the patient in direct face-to-face clinical care.  Greater than 50% of this time was spent in counseling and coordination of care with the patient.    Burnard Leigh, MD 08/25/2017, 12:35 PM

## 2017-08-26 ENCOUNTER — Telehealth (INDEPENDENT_AMBULATORY_CARE_PROVIDER_SITE_OTHER): Payer: Self-pay

## 2017-08-26 NOTE — Telephone Encounter (Signed)
Left patients mother a voicemail informing that all results are normal thus far, still awaiting the vitamin D results and will call once it is available . Maryjean Morn, CMA

## 2017-08-26 NOTE — Telephone Encounter (Signed)
-----   Message from Loletta Specter, PA-C sent at 08/26/2017  5:04 PM EDT ----- All tests are normal thus far. Still waiting for Vitamin D result.

## 2017-08-28 LAB — CBC WITH DIFFERENTIAL/PLATELET
BASOS ABS: 0 10*3/uL (ref 0.0–0.2)
BASOS: 0 %
EOS (ABSOLUTE): 0.1 10*3/uL (ref 0.0–0.4)
Eos: 1 %
HEMOGLOBIN: 14.5 g/dL (ref 11.1–15.9)
Hematocrit: 42.9 % (ref 34.0–46.6)
Immature Grans (Abs): 0 10*3/uL (ref 0.0–0.1)
Immature Granulocytes: 0 %
Lymphocytes Absolute: 1.8 10*3/uL (ref 0.7–3.1)
Lymphs: 25 %
MCH: 31.2 pg (ref 26.6–33.0)
MCHC: 33.8 g/dL (ref 31.5–35.7)
MCV: 92 fL (ref 79–97)
Monocytes Absolute: 0.6 10*3/uL (ref 0.1–0.9)
Monocytes: 8 %
NEUTROS ABS: 4.7 10*3/uL (ref 1.4–7.0)
Neutrophils: 66 %
Platelets: 295 10*3/uL (ref 150–379)
RBC: 4.65 x10E6/uL (ref 3.77–5.28)
RDW: 13.6 % (ref 12.3–15.4)
WBC: 7.2 10*3/uL (ref 3.4–10.8)

## 2017-08-28 LAB — COMPREHENSIVE METABOLIC PANEL
ALK PHOS: 58 IU/L (ref 39–117)
ALT: 18 IU/L (ref 0–32)
AST: 25 IU/L (ref 0–40)
Albumin/Globulin Ratio: 2.2 (ref 1.2–2.2)
Albumin: 4.9 g/dL (ref 3.5–5.5)
BILIRUBIN TOTAL: 0.2 mg/dL (ref 0.0–1.2)
BUN / CREAT RATIO: 25 — AB (ref 9–23)
BUN: 17 mg/dL (ref 6–20)
CHLORIDE: 100 mmol/L (ref 96–106)
CO2: 23 mmol/L (ref 20–29)
CREATININE: 0.69 mg/dL (ref 0.57–1.00)
Calcium: 9.7 mg/dL (ref 8.7–10.2)
GFR calc Af Amer: 142 mL/min/{1.73_m2} (ref >59)
GFR calc non Af Amer: 123 mL/min/{1.73_m2} (ref >59)
GLUCOSE: 74 mg/dL (ref 65–99)
Globulin, Total: 2.2 g/dL (ref 1.5–4.5)
Potassium: 4.5 mmol/L (ref 3.5–5.2)
Sodium: 143 mmol/L (ref 134–144)
Total Protein: 7.1 g/dL (ref 6.0–8.5)

## 2017-08-28 LAB — HIV ANTIBODY (ROUTINE TESTING W REFLEX): HIV SCREEN 4TH GENERATION: NONREACTIVE

## 2017-08-28 LAB — THYROID PANEL WITH TSH
FREE THYROXINE INDEX: 1.8 (ref 1.2–4.9)
T3 UPTAKE RATIO: 25 % (ref 24–39)
T4, Total: 7.3 ug/dL (ref 4.5–12.0)
TSH: 0.677 u[IU]/mL (ref 0.450–4.500)

## 2017-08-28 LAB — VITAMIN D 1,25 DIHYDROXY
VITAMIN D 1, 25 (OH) TOTAL: 27 pg/mL
Vitamin D3 1, 25 (OH)2: 26 pg/mL

## 2017-08-29 ENCOUNTER — Telehealth (INDEPENDENT_AMBULATORY_CARE_PROVIDER_SITE_OTHER): Payer: Self-pay

## 2017-08-29 NOTE — Telephone Encounter (Signed)
Left patients mother a voicemail informing of mild vitamin D insufficiency, take Vitamin D 2000 IU daily and all other labs normal. Call the office with any questions. Maryjean Morn, CMA

## 2017-08-29 NOTE — Telephone Encounter (Signed)
-----   Message from Loletta Specter, PA-C sent at 08/28/2017  5:39 PM EDT ----- Mild Vitamin D insufficiency. Take Vit D 2000 IU daily. Rest of labs normal.

## 2017-09-01 ENCOUNTER — Encounter

## 2017-09-01 ENCOUNTER — Ambulatory Visit (INDEPENDENT_AMBULATORY_CARE_PROVIDER_SITE_OTHER): Payer: Medicaid Other | Admitting: Licensed Clinical Social Worker

## 2017-09-01 DIAGNOSIS — F332 Major depressive disorder, recurrent severe without psychotic features: Secondary | ICD-10-CM

## 2017-09-01 DIAGNOSIS — F840 Autistic disorder: Secondary | ICD-10-CM

## 2017-09-03 ENCOUNTER — Encounter (HOSPITAL_COMMUNITY): Payer: Self-pay | Admitting: Licensed Clinical Social Worker

## 2017-09-03 ENCOUNTER — Telehealth: Payer: Self-pay | Admitting: Neurology

## 2017-09-03 NOTE — Telephone Encounter (Signed)
Patient requesting new Rx for levETIRAcetam (KEPPRA) 250 MG tablet [facility] to [facility] [address]. She has changed pharmacies.

## 2017-09-03 NOTE — Telephone Encounter (Signed)
I called pt. She stated [facility] outpt pharmacy stating they have no record of pt on file. Mother confirmed. She stopped by pharmacy. They are going on vacation and need to pick up refill. I advised I will call pharmacy and see what's going on.  I called and spoke w/ Darl Pikes at [facility] out[facility]. She confirmed they received rx keppra on 06/12/17 qty 180, 1 refill. ! Refill remaining. I called pt/mother back and advised they need to [facility] and have them contact [facility] to transfer rx. Advised them to give [facility]. She verbalized understanding and appreciation for call.   Pharmacy updated in Epic for pt.

## 2017-09-03 NOTE — Progress Notes (Signed)
Comprehensive Clinical Assessment (CCA) Note  09/03/2017 Jill Alexander [id number]  Visit Diagnosis:      ICD-10-CM   1. Severe episode of recurrent major depressive disorder, without psychotic features (HCC) F33.2   2. Autism spectrum disorder F84.0       CCA Part One  Part One has been completed on paper by the patient.  (See scanned document in Chart Review)  CCA Part Two A  Intake/Chief Complaint:  CCA Intake With Chief Complaint CCA Part Two Date: 09/01/17 CCA Part Two Time: 1304 Chief Complaint/Presenting Problem: "I have a long hx of needing help for ASD, anxiety, and depression. I was supposed to attend an IOP group but I only have medicaid". Patients Currently Reported Symptoms/Problems: Worry, stress, shakiness, hx of SI, depression, tearfulness, communication problems, difficulty w/ transitions Collateral Involvement: Mother appears in lobby w/ pt Individual's Strengths: Family Support, kind Individual's Preferences: "I need to be able to joke around every now and then" Individual's Abilities: able bodied, makes connections Type of Services Patient Feels Are Needed: Individual or group therapy  Mental Health Symptoms Depression:  Depression: Difficulty Concentrating, Change in energy/activity, Irritability, Sleep (too much or little), Tearfulness, Worthlessness, Hopelessness  Mania:     Anxiety:   Anxiety: Difficulty concentrating, Irritability, Restlessness, Sleep, Worrying, Tension  Psychosis:     Trauma:     Obsessions:     Compulsions:     Inattention:  Inattention: Does not seem to listen, Fails to pay attention/makes careless mistakes, Forgetful, Loses things, Poor follow-through on tasks, Symptoms before age 18, Symptoms present in 2 or more settings, Disorganized, Avoids/dislikes activities that require focus  Hyperactivity/Impulsivity:     Oppositional/Defiant Behaviors:     Borderline Personality:     Other Mood/Personality Symptoms:      Mental  Status Exam Appearance and self-care  Stature:  Stature: Small  Weight:  Weight: Overweight  Clothing:  Clothing: Disheveled, Casual  Grooming:  Grooming: Normal  Cosmetic use:  Cosmetic Use: None  Posture/gait:  Posture/Gait: Stooped  Motor activity:  Motor Activity: Tremor, Agitated  Sensorium  Attention:  Attention: Distractible, Confused  Concentration:  Concentration: Anxiety interferes  Orientation:  Orientation: X5  Recall/memory:  Recall/Memory: Normal  Affect and Mood  Affect:  Affect: Anxious, Tearful  Mood:  Mood: Anxious, Pessimistic, Irritable  Relating  Eye contact:  Eye Contact: Avoided  Facial expression:  Facial Expression: Constricted, Anxious  Attitude toward examiner:  Attitude Toward Examiner: Cooperative, Silly, Economist, Guarded  Thought and Language  Speech flow: Speech Flow: Normal, Articulation error  Thought content:  Thought Content: Appropriate to mood and circumstances  Preoccupation:     Hallucinations:     Organization:     Company secretary of Knowledge:  Fund of Knowledge: Average  Intelligence:  Intelligence: Average  Abstraction:  Abstraction: Normal  Judgement:  Judgement: Normal  Reality Testing:  Reality Testing: Variable  Insight:  Insight: Flashes of insight  Decision Making:  Decision Making: Normal  Social Functioning  Social Maturity:  Social Maturity: Self-centered, Isolates  Social Judgement:  Social Judgement: Naive  Stress  Stressors:  Stressors: Family conflict, Transitions, Work  Coping Ability:  Coping Ability: Building surveyor Deficits:     Supports:      Family and Psychosocial History: Family history Marital status: Single Are you sexually active?: No Does patient have children?: No  Childhood History:  Childhood History By whom was/is the patient raised?: Both parents Additional childhood history information: moved frequently, lived internationally in  [place] and [place]. Patient's description of  current relationship with people who raised him/her: my mother and I get into a lot of disagreements but we're fairly close Does patient have siblings?: Yes Number of Siblings: 2 Description of patient's current relationship with siblings: "I'm not very close to them, they're successful and I feel I am not". Did patient suffer any verbal/emotional/physical/sexual abuse as a child?: No Did patient suffer from severe childhood neglect?: No Has patient ever been sexually abused/assaulted/raped as an adolescent or adult?: No Was the patient ever a victim of a crime or a disaster?: No Witnessed domestic violence?: No Has patient been effected by domestic violence as an adult?: No  CCA Part Two B  Employment/Work Situation: Employment / Work Psychologist, occupational Employment situation: Unemployed Has patient ever been in the [employer]?: No  Education: Education Last Grade Completed: 12 Did Garment/textile technologist From [employer]?: Yes Did Theme park manager?: No Did Designer, television/film set?: No Did You Have Any Difficulty At [employer]?: Yes  Religion: Religion/Spirituality Are You A Religious Person?: No  Leisure/Recreation: Leisure / Recreation Leisure and Hobbies: books, reading, fantasy  Exercise/Diet: Exercise/Diet Do You Exercise?: No Have You Gained or Lost A Significant Amount of Weight in the Past Six Months?: No Do You Follow a Special Diet?: No Do You Have Any Trouble Sleeping?: No  CCA Part Two C  Alcohol/Drug Use: Alcohol / Drug Use History of alcohol / drug use?: No history of alcohol / drug abuse                      CCA Part Three  ASAM's:  Six Dimensions of Multidimensional Assessment  Dimension 1:  Acute Intoxication and/or Withdrawal Potential:     Dimension 2:  Biomedical Conditions and Complications:     Dimension 3:  Emotional, Behavioral, or Cognitive Conditions and Complications:     Dimension 4:  Readiness to Change:     Dimension 5:  Relapse, Continued use,  or Continued Problem Potential:     Dimension 6:  Recovery/Living Environment:      Substance use Disorder (SUD)    Social Function:  Social Functioning Social Maturity: Self-centered, Isolates Social Judgement: Naive  Stress:  Stress Stressors: Family conflict, Transitions, Work Coping Ability: Overwhelmed Patient Takes Medications The Way The Doctor Instructed?: Yes Priority Risk: Low Acuity  Risk Assessment- Self-Harm Potential: Risk Assessment For Self-Harm Potential Thoughts of Self-Harm: No current thoughts  Risk Assessment -Dangerous to Others Potential: Risk Assessment For Dangerous to Others Potential Method: No Plan  DSM5 Diagnoses: Patient Active Problem List   Diagnosis Date Noted  . Seizures (HCC) 04/09/2017  . 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

## 2017-09-10 ENCOUNTER — Ambulatory Visit (INDEPENDENT_AMBULATORY_CARE_PROVIDER_SITE_OTHER): Payer: Medicaid Other | Admitting: Psychiatry

## 2017-09-10 ENCOUNTER — Encounter (HOSPITAL_COMMUNITY): Payer: Self-pay | Admitting: Psychiatry

## 2017-09-10 DIAGNOSIS — F4010 Social phobia, unspecified: Secondary | ICD-10-CM

## 2017-09-10 DIAGNOSIS — F840 Autistic disorder: Secondary | ICD-10-CM

## 2017-09-10 DIAGNOSIS — Z79899 Other long term (current) drug therapy: Secondary | ICD-10-CM | POA: Diagnosis not present

## 2017-09-10 DIAGNOSIS — F332 Major depressive disorder, recurrent severe without psychotic features: Secondary | ICD-10-CM

## 2017-09-10 DIAGNOSIS — F429 Obsessive-compulsive disorder, unspecified: Secondary | ICD-10-CM | POA: Diagnosis not present

## 2017-09-10 DIAGNOSIS — Z818 Family history of other mental and behavioral disorders: Secondary | ICD-10-CM | POA: Diagnosis not present

## 2017-09-10 MED ORDER — FLUOXETINE HCL 40 MG PO CAPS: 40.0000 mg | ORAL_CAPSULE | Freq: Every day | ORAL | 0 refills | Status: DC

## 2017-09-10 MED ORDER — BREXPIPRAZOLE 3 MG PO TABS: 3.0000 mg | ORAL_TABLET | ORAL | 0 refills | Status: DC

## 2017-09-10 NOTE — Progress Notes (Signed)
BH MD/PA/NP OP Progress Note  09/10/2017 3:15 PM Jill Alexander  MRN:  [id number]  Chief Complaint: got back from [place] yesterday HPI: Jill Alexander presents as much less anxious and irritable compared to prior visits.  No intolerance or side effects from the brexpiprazole.  We agreed to increase to 3 mg to target some ongoing breakthrough anxiety and irritability.  She continues on Prozac 40 mg daily, and uses the Ativan 1/2-1 tablet twice a day.  No acute safety issues.  I reviewed with the patient that I will be departing from this clinic in approximately 3 months and we discussed the transition plan of care to Dr. Lolly Alexander.  She has had an initial appointment with Jill Alexander and reports that she is looking forward to working with him.  She is also working with the autism [employer] of [place] and planning for a job and/or return to school.  Visit Diagnosis:    ICD-10-CM   1. Severe episode of recurrent major depressive disorder, without psychotic features (HCC) F33.2 Brexpiprazole (REXULTI) 3 MG TABS    FLUoxetine (PROZAC) 40 MG capsule  2. Obsessive-compulsive disorder, unspecified type F42.9 Brexpiprazole (REXULTI) 3 MG TABS  3. Social anxiety disorder F40.10 Brexpiprazole (REXULTI) 3 MG TABS  4. Autism spectrum disorder F84.0 FLUoxetine (PROZAC) 40 MG capsule    Past Psychiatric History: See intake H&P for full details. Reviewed, with no updates at this time.   Past Medical History:  Past Medical History:  Diagnosis Date  . Anxiety   . Autism   . Depression   . Seizures (HCC)   . 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 NO PAST SURGERIES      Family Psychiatric History: See intake H&P for full details. Reviewed, with no updates at this time.   Family History:  Family History  Problem Relation Age of Onset  . Depression Father   . Anxiety disorder Sister   . Bipolar disorder Sister     Social History:  Social History    Socioeconomic History  . Marital status: Single    Spouse name: Not on file  . Number of children: Not on file  . Years of education: Not on file  . Highest education level: Not on file  Occupational History  . Not on file  Social Needs  . Financial resource strain: Not on file  . Food insecurity:    Worry: Not on file    Inability: Not on file  . Transportation needs:    Medical: Not on file    Non-medical: Not on file  Tobacco Use  . Smoking status: Never Smoker  . Smokeless tobacco: Never Used  Substance and Sexual Activity  . Alcohol use: No  . Drug use: No  . Sexual activity: Never  Lifestyle  . Physical activity:    Days per week: Not on file    Minutes per session: Not on file  . Stress: Not on file  Relationships  . Social connections:    Talks on phone: Not on file    Gets together: Not on file    Attends religious service: Not on file    Active member of club or organization: Not on file    Attends meetings of clubs or organizations: Not on file    Relationship status: Not on file  Other Topics Concern  . Not on file  Social History Narrative   Lives with parents   Caffeine use: 2-3  cups per day   Left handed     Allergies: No Known Allergies  Metabolic Disorder Labs: No results found for: HGBA1C, MPG No results found for: PROLACTIN No results found for: CHOL, TRIG, HDL, CHOLHDL, VLDL, LDLCALC Lab Results  Component Value Date   TSH 0.677 08/21/2017    Therapeutic Level Labs: No results found for: LITHIUM No results found for: VALPROATE No components found for:  CBMZ  Current Medications: Current Outpatient Medications  Medication Sig Dispense Refill  . Acetaminophen (MIDOL PO) Take by mouth.    Marland Kitchen FLUoxetine (PROZAC) 40 MG capsule Take 1 capsule (40 mg total) by mouth daily. 90 capsule 0  . Ibuprofen (MOTRIN PO) Take 1 Dose by mouth as needed.    . lamoTRIgine (LAMICTAL) 200 MG tablet Take 1 tablet (200 mg total) by mouth daily. 90  tablet 1  . levETIRAcetam (KEPPRA) 250 MG tablet Take 1 tablet (250 mg total) by mouth 2 (two) times daily. 180 tablet 1  . LORazepam (ATIVAN) 2 MG tablet Take 1 tablet (2 mg total) by mouth 2 (two) times daily as needed for anxiety. 60 tablet 0  . Multiple Vitamins-Minerals (MULTIVITAMIN PO) Take 1 tablet by mouth daily.    Marland Kitchen topiramate (TOPAMAX) 25 MG tablet Take 1 tablet (25 mg total) by mouth 2 (two) times daily. 30 tablet 2  . Brexpiprazole (REXULTI) 3 MG TABS Take 3 mg by mouth every morning. 90 tablet 0   No current facility-administered medications for this visit.      Musculoskeletal: Strength & Muscle Tone: within normal limits Gait & Station: normal Patient leans: N/A  Psychiatric Specialty Exam: ROS  Blood pressure 108/70, pulse 72, height  (1.6 m), weight 145 lb (65.8 kg).Body mass index is 25.69 kg/m.  General Appearance: Casual and Fairly Groomed  Eye Contact:  Fair  Speech:  Normal Rate and baseline stutter  Volume:  Normal  Mood:  Anxious and Irritable  Affect:  Congruent and less than prior visits, gradauly improveming  Thought Process:  Goal Directed and Descriptions of Associations: Tangential  Orientation:  Full (Time, Place, and Person)  Thought Content: Logical and Rumination   Suicidal Thoughts:  No  Homicidal Thoughts:  No  Memory:  Immediate;   Fair  Judgement:  Fair  Insight:  Fair  Psychomotor Activity:  Normal and baseline tremulousness  Concentration:  Concentration: Fair  Recall:  [employer] of Knowledge: Fair  Language: Fair  Akathisia:  Negative  Handed:  Right  AIMS (if indicated): not done  Assets:  Communication Skills Desire for Improvement Housing Social Support Transportation  ADL's:  Intact  Cognition: WNL  Sleep:  Fair   Screenings: GAD-7     [facility] Visit from 08/21/2017 in [facility]  Total GAD-7 Score  18    PHQ2-9     Office Visit from 08/21/2017 in [facility]   PHQ-2 Total Score  6  PHQ-9 Total Score  18       Assessment and Plan:  Jill Alexander has continued to have some interval improvements with combination of fluoxetine and Rexulti, we agreed to increase Rexulti to 3 mg.  Her use of Ativan has gradually decreased over the past few months, and she has recently started individual therapy with Jill Alexander.  We will follow-up in 1 week.  I shared with patient that I will be departing from this clinic in 3 months, and we are working on a plan of transition  to Dr. Lolly Alexander after this writer's departure.  1. Severe episode of recurrent major depressive disorder, without psychotic features (HCC)   2. Obsessive-compulsive disorder, unspecified type   3. Social anxiety disorder   4. Autism spectrum disorder     Status of current problems: gradually improving  Labs Ordered: No orders of the defined types were placed in this encounter.   Labs Reviewed: NA  Collateral Obtained/Records Reviewed: NA  Plan:  Ativan 1-2 mg twice a day for anxiety or agitation Continue Prozac 40 mg Rexulti increase to 3 mg daily  I spent 15 minutes with the patient in direct face-to-face clinical care.  Greater than 50% of this time was spent in counseling and coordination of care with the patient.    Burnard Leigh, MD 09/10/2017, 3:15 PM

## 2017-09-11 ENCOUNTER — Ambulatory Visit (INDEPENDENT_AMBULATORY_CARE_PROVIDER_SITE_OTHER): Payer: Medicaid Other | Admitting: Licensed Clinical Social Worker

## 2017-09-11 DIAGNOSIS — F429 Obsessive-compulsive disorder, unspecified: Secondary | ICD-10-CM | POA: Diagnosis not present

## 2017-09-11 DIAGNOSIS — F4010 Social phobia, unspecified: Secondary | ICD-10-CM | POA: Diagnosis not present

## 2017-09-11 DIAGNOSIS — F332 Major depressive disorder, recurrent severe without psychotic features: Secondary | ICD-10-CM

## 2017-09-11 DIAGNOSIS — F840 Autistic disorder: Secondary | ICD-10-CM

## 2017-09-12 ENCOUNTER — Encounter (HOSPITAL_COMMUNITY): Payer: Self-pay | Admitting: Licensed Clinical Social Worker

## 2017-09-12 NOTE — Progress Notes (Signed)
   THERAPIST PROGRESS NOTE  Session Time: 11-12  Participation Level: Active  Behavioral Response: Fairly Groomed and NeatAlertEuthymic  Type of Therapy: Individual Therapy  Treatment Goals addressed: Anxiety  Interventions: CBT, Strength-based and Supportive  Summary: Wen Munford is a 22 y.o. female who presents with Autism Spectrum Disorder, GAD, and MDD. She is active and engaged in group. Pt discusses her recent trip to [place]. Pt is easily distracted, second guesses her verbalizations throughout session, and states she has "always struggled w/ low self esteem". Counselor and pt discuss tx planning details using person centered goal setting. Counselor and pt sign tx plan w/ 2 goals identified for this tx. Pt will improve her coping skills for handling overwhelming emotions and pt will research possibility of living on her own asap.    Suicidal/Homicidal: Nowithout intent/plan  Therapist Response: Counselor used open questions, humor, and person-centered goal setting to help pt identify her goals for tx and build rapport, establish therapeutic relationship.  Plan: Return again in 2 weeks.  Diagnosis:    ICD-10-CM   1. Severe episode of recurrent major depressive disorder, without psychotic features (HCC) F33.2   2. Obsessive-compulsive disorder, unspecified type F42.9   3. Social anxiety disorder F40.10   4. Autism spectrum disorder F84.0       Margo Common, LCAS-A 09/12/2017

## 2017-09-15 ENCOUNTER — Encounter (HOSPITAL_COMMUNITY): Payer: Self-pay | Admitting: Psychiatry

## 2017-09-16 ENCOUNTER — Encounter (INDEPENDENT_AMBULATORY_CARE_PROVIDER_SITE_OTHER): Payer: Self-pay | Admitting: Physician Assistant

## 2017-09-17 ENCOUNTER — Ambulatory Visit (HOSPITAL_BASED_OUTPATIENT_CLINIC_OR_DEPARTMENT_OTHER): Payer: Medicaid Other | Attending: Psychiatry | Admitting: Internal Medicine

## 2017-09-17 ENCOUNTER — Ambulatory Visit (INDEPENDENT_AMBULATORY_CARE_PROVIDER_SITE_OTHER): Payer: Medicaid Other | Admitting: Psychiatry

## 2017-09-17 ENCOUNTER — Encounter (HOSPITAL_COMMUNITY): Payer: Self-pay | Admitting: Psychiatry

## 2017-09-17 DIAGNOSIS — G4730 Sleep apnea, unspecified: Secondary | ICD-10-CM

## 2017-09-17 DIAGNOSIS — R0683 Snoring: Secondary | ICD-10-CM

## 2017-09-17 DIAGNOSIS — Z79899 Other long term (current) drug therapy: Secondary | ICD-10-CM

## 2017-09-17 DIAGNOSIS — F840 Autistic disorder: Secondary | ICD-10-CM | POA: Diagnosis not present

## 2017-09-17 DIAGNOSIS — Z818 Family history of other mental and behavioral disorders: Secondary | ICD-10-CM

## 2017-09-17 DIAGNOSIS — F332 Major depressive disorder, recurrent severe without psychotic features: Secondary | ICD-10-CM | POA: Diagnosis not present

## 2017-09-17 DIAGNOSIS — G4719 Other hypersomnia: Secondary | ICD-10-CM | POA: Diagnosis not present

## 2017-09-17 DIAGNOSIS — K589 Irritable bowel syndrome without diarrhea: Secondary | ICD-10-CM | POA: Diagnosis not present

## 2017-09-17 MED ORDER — FLUOXETINE HCL 40 MG PO CAPS: 80.0000 mg | ORAL_CAPSULE | Freq: Every day | ORAL | 0 refills | Status: DC

## 2017-09-17 MED ORDER — LORAZEPAM 2 MG PO TABS: 2.0000 mg | ORAL_TABLET | Freq: Two times a day (BID) | ORAL | 0 refills | Status: DC | PRN

## 2017-09-17 NOTE — Progress Notes (Signed)
BH MD/PA/NP OP Progress Note  09/17/2017 1:51 PM Jill Alexander  MRN:  [id number]  Chief Complaint: agitated, anxious    HPI: Jill Alexander presents with mom, shares that they do not believe Jill Alexander has added any benefit, and wonder about going off of it.  We agreed to discontinue and increase Prozac, as she has had good benefit with Prozac in the past.  We agreed to titrate Prozac to 80 mg.  We also spent time discussing some of her ongoing GI issues and concerns about celiac disease given some of her mood and anxiety symptoms surrounding exposure to foods that contain gluten.  No acute safety issues or suicidality with Jill Alexander.  She does continue to work with Jill Alexander and feels that this is a positive intervention.  We agreed to follow-up in 10 days as scheduled and if needed we may increase Lamictal above the therapeutic range to see if this adds any value in terms of her mood lability and anxiety.  She continues to use Ativan about twice a day.  Visit Diagnosis:    ICD-10-CM   1. Irritable bowel syndrome, unspecified type K58.9 LORazepam (ATIVAN) 2 MG tablet    Celiac Disease Ab Screen w/Rfx    Celiac Disease Ab Screen w/Rfx    CANCELED: Celiac Disease Ab Screen w/Rfx  2. Severe episode of recurrent major depressive disorder, without psychotic features (HCC) F33.2 FLUoxetine (PROZAC) 40 MG capsule    LORazepam (ATIVAN) 2 MG tablet  3. Autism spectrum disorder F84.0 FLUoxetine (PROZAC) 40 MG capsule    LORazepam (ATIVAN) 2 MG tablet    Past Psychiatric History: See intake H&P for full details. Reviewed, with no updates at this time.   Past Medical History:  Past Medical History:  Diagnosis Date  . Anxiety   . Autism   . Depression   . Seizures (HCC)   . 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 NO PAST SURGERIES      Family Psychiatric History: See intake H&P for full details. Reviewed, with no updates at this  time.   Family History:  Family History  Problem Relation Age of Onset  . Depression Father   . Anxiety disorder Sister   . Bipolar disorder Sister     Social History:  Social History   Socioeconomic History  . Marital status: Single    Spouse name: Not on file  . Number of children: Not on file  . Years of education: Not on file  . Highest education level: Not on file  Occupational History  . Not on file  Social Needs  . Financial resource strain: Not on file  . Food insecurity:    Worry: Not on file    Inability: Not on file  . Transportation needs:    Medical: Not on file    Non-medical: Not on file  Tobacco Use  . Smoking status: Never Smoker  . Smokeless tobacco: Never Used  Substance and Sexual Activity  . Alcohol use: No  . Drug use: No  . Sexual activity: Never  Lifestyle  . Physical activity:    Days per week: Not on file    Minutes per session: Not on file  . Stress: Not on file  Relationships  . Social connections:    Talks on phone: Not on file    Gets together: Not on file    Attends religious service: Not on file    Active member of club  or organization: Not on file    Attends meetings of clubs or organizations: Not on file    Relationship status: Not on file  Other Topics Concern  . Not on file  Social History Narrative   Lives with parents   Caffeine use: 2-3 cups per day   Left handed     Allergies: No Known Allergies  Metabolic Disorder Labs: No results found for: HGBA1C, MPG No results found for: PROLACTIN No results found for: CHOL, TRIG, HDL, CHOLHDL, VLDL, LDLCALC Lab Results  Component Value Date   TSH 0.677 08/21/2017    Therapeutic Level Labs: No results found for: LITHIUM No results found for: VALPROATE No components found for:  CBMZ  Current Medications: Current Outpatient Medications  Medication Sig Dispense Refill  . Acetaminophen (MIDOL PO) Take by mouth.    Marland Kitchen FLUoxetine (PROZAC) 40 MG capsule Take 2 capsules  (80 mg total) by mouth daily. 180 capsule 0  . Ibuprofen (MOTRIN PO) Take 1 Dose by mouth as needed.    . lamoTRIgine (LAMICTAL) 200 MG tablet Take 1 tablet (200 mg total) by mouth daily. 90 tablet 1  . levETIRAcetam (KEPPRA) 250 MG tablet Take 1 tablet (250 mg total) by mouth 2 (two) times daily. 180 tablet 1  . LORazepam (ATIVAN) 2 MG tablet Take 1 tablet (2 mg total) by mouth 2 (two) times daily as needed for anxiety. 60 tablet 0  . Multiple Vitamins-Minerals (MULTIVITAMIN PO) Take 1 tablet by mouth daily.     No current facility-administered medications for this visit.      Musculoskeletal: Strength & Muscle Tone: within normal limits Gait & Station: normal Patient leans: N/A  Psychiatric Specialty Exam: ROS  There were no vitals taken for this visit.There is no height or weight on file to calculate BMI.  General Appearance: Casual and Fairly Groomed  Eye Contact:  Fair  Speech:  Normal Rate  Volume:  Normal  Mood:  Anxious, Dysphoric and Irritable  Affect:  Congruent  Thought Process:  Goal Directed and Descriptions of Associations: Tangential  Orientation:  Full (Time, Place, and Person)  Thought Content: Logical   Suicidal Thoughts:  No  Homicidal Thoughts:  No  Memory:  Immediate;   Good  Judgement:  Fair  Insight:  Shallow  Psychomotor Activity:  Normal  Concentration:  Attention Span: Good  Recall:  Fair  Fund of Knowledge: Fair  Language: Fair  Akathisia:  Negative  Handed:  Right  AIMS (if indicated): not done  Assets:  Desire for Improvement Financial Resources/Insurance Housing Social Support  ADL's:  Intact  Cognition: WNL  Sleep:  Fair   Screenings: GAD-7     Office Visit from 08/21/2017 in [facility]  Total GAD-7 Score  18    PHQ2-9     Office Visit from 08/21/2017 in [facility]  PHQ-2 Total Score  6  PHQ-9 Total Score  18      Assessment and Plan:  Jill Alexander presents with ongoing  complaints of anxiety, irritability, depression.  Spent time reviewing her psychiatric symptoms and we agree that we have a low suspicion of any bipolar affective illness.  Suspect that her ongoing mood lability is due to ongoing depressive and anxiety symptoms.  We agreed to further titrate Prozac to 80 mg and consider a change in the dose of Lamictal if needed.  Jill Alexander has not been particularly helpful so we agreed to discontinue and we will follow-up in 8-10  days.  She continues in individual therapy with Wes.  No acute safety issues.  1. Irritable bowel syndrome, unspecified type   2. Severe episode of recurrent major depressive disorder, without psychotic features (HCC)   3. Autism spectrum disorder    Status of current problems: unchanged  Labs Ordered: Orders Placed This Encounter  Procedures  . Celiac Disease Ab Screen w/Rfx    Standing Status:   Future    Number of Occurrences:   1    Standing Expiration Date:   09/18/2018   Labs Reviewed: reviewed labs from pcp  Collateral Obtained/Records Reviewed: mom present to corroborate  Plan:  Prozac increased to 80 mg daily Consider augmentation with nortriptyline or other try cyclic if patient has ongoing mood and anxiety complaints Consider increase of Lamictal above 200 mg Patient is getting her sleep study done this week to assess for sleep apnea Test for celiac's antibody given ongoing IBS symptoms and mood irritability with exposure to gluten Return to clinic in 8-10 days Continue therapy with Wes  I spent 20 minutes with the patient in direct face-to-face clinical care.  Greater than 50% of this time was spent in counseling and coordination of care with the patient.    Burnard Leigh, MD 09/17/2017, 1:51 PM

## 2017-09-19 ENCOUNTER — Other Ambulatory Visit (HOSPITAL_COMMUNITY): Payer: Self-pay | Admitting: Psychiatry

## 2017-09-19 ENCOUNTER — Encounter: Payer: Self-pay | Admitting: Gastroenterology

## 2017-09-19 DIAGNOSIS — R110 Nausea: Secondary | ICD-10-CM

## 2017-09-19 DIAGNOSIS — K581 Irritable bowel syndrome with constipation: Secondary | ICD-10-CM

## 2017-09-19 MED ORDER — ONDANSETRON HCL 4 MG PO TABS: 4.0000 mg | ORAL_TABLET | Freq: Three times a day (TID) | ORAL | 1 refills | Status: DC | PRN

## 2017-09-20 DIAGNOSIS — G4730 Sleep apnea, unspecified: Secondary | ICD-10-CM

## 2017-09-20 NOTE — Procedures (Signed)
  Patient Name: Jill Alexander, Jill Alexander Date: 09/17/2017 Gender: Female D.O.B: Dec 07, 1995 Age (years): 21 Referring Provider: Angus Palms Height (inches): 63 Interpreting Physician: Jetty Duhamel MD, ABSM Weight (lbs): 144 RPSGT: Celene Kras BMI: 26 MRN: [id number] Neck Size: 14.00 CLINICAL INFORMATION Sleep Study Type: HST  Indication for sleep study: Excessive Daytime Sleepiness, Snoring  Epworth Sleepiness Score: 13  SLEEP STUDY TECHNIQUE A multi-channel overnight portable sleep study was performed. The channels recorded were: nasal airflow, thoracic respiratory movement, and oxygen saturation with a pulse oximetry. Snoring was also monitored.  MEDICATIONS Patient self administered medications include: none reported.  SLEEP ARCHITECTURE Patient was studied for 381 minutes. The sleep efficiency was 99.9 % and the patient was supine for 88.9%. The arousal index was 0.0 per hour.  RESPIRATORY PARAMETERS The overall AHI was 0.0 per hour, with a central apnea index of 0.0 per hour.  The oxygen nadir was 81% during sleep.  CARDIAC DATA Mean heart rate during sleep was 66.2 bpm.  IMPRESSIONS - No significant obstructive sleep apnea occurred during this study (AHI = 0.0/h). - No significant central sleep apnea occurred during this study (CAI = 0.0/h). - Oxygen desaturation was noted during this study (Min O2 = 81%, Mean 95%). - Patient snored.  DIAGNOSIS - Normal study - Primary snoring  RECOMMENDATIONS - Be careful with alcohol, sedatives and other CNS depressants that may worsen sleep apnea and disrupt normal sleep architecture. - Management of snoring might include treatment for rhinitis, possibly an oral appliance, chin strap, or ENT evaluation if needed. - Sleep hygiene should be reviewed to assess factors that may improve sleep quality. - Weight management and regular exercise should be initiated or continued.  [Electronically signed] 09/20/2017 10:30  AM  Jetty Duhamel MD, ABSM Diplomate, American Board of Sleep Medicine   NPI: [id number]                           Jetty Duhamel Diplomate, American Board of Sleep Medicine  ELECTRONICALLY SIGNED ON:  09/20/2017, 10:24 AM [facility] PH: [phone]   FX: [phone] ACCREDITED BY THE AMERICAN ACADEMY OF SLEEP MEDICINE

## 2017-09-22 ENCOUNTER — Encounter (HOSPITAL_COMMUNITY): Payer: Self-pay | Admitting: Psychiatry

## 2017-09-22 LAB — CELIAC DISEASE AB SCREEN W/RFX
Antigliadin Abs, IgA: 3 units (ref 0–19)
IgA/Immunoglobulin A, Serum: 104 mg/dL (ref 87–352)
Transglutaminase IgA: <2 U/mL (ref 0–3)

## 2017-09-23 ENCOUNTER — Encounter (HOSPITAL_COMMUNITY): Payer: Self-pay | Admitting: Psychiatry

## 2017-09-23 ENCOUNTER — Ambulatory Visit (INDEPENDENT_AMBULATORY_CARE_PROVIDER_SITE_OTHER): Payer: Medicaid Other | Admitting: Licensed Clinical Social Worker

## 2017-09-23 DIAGNOSIS — F332 Major depressive disorder, recurrent severe without psychotic features: Secondary | ICD-10-CM

## 2017-09-23 DIAGNOSIS — F840 Autistic disorder: Secondary | ICD-10-CM | POA: Diagnosis not present

## 2017-09-25 ENCOUNTER — Encounter (HOSPITAL_COMMUNITY): Payer: Self-pay | Admitting: Licensed Clinical Social Worker

## 2017-09-25 NOTE — Progress Notes (Signed)
   THERAPIST PROGRESS NOTE  Session Time: 9-10  Participation Level: Active  Behavioral Response: Neat and Well GroomedAlertAnxious  Type of Therapy: Individual Therapy  Treatment Goals addressed: Anxiety  Interventions: CBT, DBT, Strength-based and Supportive  Summary: Archie Atilano is a 22 y.o. female who presents with MDD and Anxiety. She states her past 2 weeks have been "about the same". She discusses her past therapeutic relationships and ways that she was helped. She states her parents are hesitant to trust young therapist. Counselor encouraged pt to consider her own needs in a therapist, rather than relying on her parent's approval. Counselor provided pt w/ a DBT distress tolerance worksheet that offered numerous coping mechanisms. Counselor asked pt to check methods she already uses while noting methods she wants to try. Pt states she will try 2 new methods of distress tolerance and self soothing in coming weeks.   Suicidal/Homicidal: Nowithout intent/plan  Therapist Response: Counselor used open questions, reflection of emotion and content and worked to help pt gain insight into her presentation and distill goals for therapy. Pt appears severely distractible and struggles to stay on topic, using humor and judgment of herself and others to distract from therapeutic content. It is unknown whether such bxs are conscious.   Plan: Return again in 2 weeks.  Diagnosis:    ICD-10-CM   1. Severe episode of recurrent major depressive disorder, without psychotic features (HCC) F33.2   2. Autism spectrum disorder F84.0       Margo Common, LCAS-A 09/25/2017

## 2017-09-29 ENCOUNTER — Ambulatory Visit (INDEPENDENT_AMBULATORY_CARE_PROVIDER_SITE_OTHER): Payer: Medicaid Other | Admitting: Psychiatry

## 2017-09-29 ENCOUNTER — Encounter (HOSPITAL_COMMUNITY): Payer: Self-pay | Admitting: Psychiatry

## 2017-09-29 DIAGNOSIS — F332 Major depressive disorder, recurrent severe without psychotic features: Secondary | ICD-10-CM | POA: Diagnosis not present

## 2017-09-29 DIAGNOSIS — K589 Irritable bowel syndrome without diarrhea: Secondary | ICD-10-CM

## 2017-09-29 DIAGNOSIS — Z818 Family history of other mental and behavioral disorders: Secondary | ICD-10-CM | POA: Diagnosis not present

## 2017-09-29 DIAGNOSIS — F840 Autistic disorder: Secondary | ICD-10-CM | POA: Diagnosis not present

## 2017-09-29 MED ORDER — FLUOXETINE HCL 20 MG PO CAPS: 20.0000 mg | ORAL_CAPSULE | Freq: Every day | ORAL | 2 refills | Status: DC

## 2017-09-29 MED ORDER — CARIPRAZINE HCL 3 MG PO CAPS: 3.0000 mg | ORAL_CAPSULE | Freq: Every day | ORAL | 0 refills | Status: DC

## 2017-09-29 NOTE — Progress Notes (Signed)
BH MD/PA/NP OP Progress Note  09/29/2017 12:25 PM Jill Alexander  MRN:  [id number]  Chief Complaint: agitated, anxious    HPI: Jill Alexander presents with ongoing agitation and anxiety. No acute safety concerns. We agreed to taper off prozac given lack of any substantial benefit and to initiate mood stabilizer.  Mom/patient agree that patients mood continues to be unpredictable in terms of anxiety/irritability day to day.  Agreed to continue on lamictal, prn ativan, and initiate vraylar. Reviewed risks/benefits of atypical antipsychotics, and agreed to start 1.5 mg daily and increase to 3 mg in 1 week as tolerated.  Visit Diagnosis:    ICD-10-CM   1. Severe episode of recurrent major depressive disorder, without psychotic features (HCC) F33.2 FLUoxetine (PROZAC) 20 MG capsule    cariprazine (VRAYLAR) capsule  2. Autism spectrum disorder F84.0 FLUoxetine (PROZAC) 20 MG capsule    cariprazine (VRAYLAR) capsule  3. Irritable bowel syndrome, unspecified type K58.9     Past Psychiatric History: See intake H&P for full details. Reviewed, with no updates at this time.   Past Medical History:  Past Medical History:  Diagnosis Date  . Anxiety   . Autism   . Depression   . Seizures (HCC)   . 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 NO PAST SURGERIES      Family Psychiatric History: See intake H&P for full details. Reviewed, with no updates at this time.   Family History:  Family History  Problem Relation Age of Onset  . Depression Father   . Anxiety disorder Sister   . Bipolar disorder Sister     Social History:  Social History   Socioeconomic History  . Marital status: Single    Spouse name: Not on file  . Number of children: Not on file  . Years of education: Not on file  . Highest education level: Not on file  Occupational History  . Not on file  Social Needs  . Financial resource strain: Not on file  . Food  insecurity:    Worry: Not on file    Inability: Not on file  . Transportation needs:    Medical: Not on file    Non-medical: Not on file  Tobacco Use  . Smoking status: Never Smoker  . Smokeless tobacco: Never Used  Substance and Sexual Activity  . Alcohol use: No  . Drug use: No  . Sexual activity: Never  Lifestyle  . Physical activity:    Days per week: Not on file    Minutes per session: Not on file  . Stress: Not on file  Relationships  . Social connections:    Talks on phone: Not on file    Gets together: Not on file    Attends religious service: Not on file    Active member of club or organization: Not on file    Attends meetings of clubs or organizations: Not on file    Relationship status: Not on file  Other Topics Concern  . Not on file  Social History Narrative   Lives with parents   Caffeine use: 2-3 cups per day   Left handed     Allergies: No Known Allergies  Metabolic Disorder Labs: No results found for: HGBA1C, MPG No results found for: PROLACTIN No results found for: CHOL, TRIG, HDL, CHOLHDL, VLDL, LDLCALC Lab Results  Component Value Date   TSH 0.677 08/21/2017    Therapeutic Level Labs: No results found  for: LITHIUM No results found for: VALPROATE No components found for:  CBMZ  Current Medications: Current Outpatient Medications  Medication Sig Dispense Refill  . Acetaminophen (MIDOL PO) Take by mouth.    . cariprazine (VRAYLAR) capsule Take 1 capsule (3 mg total) by mouth daily. 90 capsule 0  . FLUoxetine (PROZAC) 20 MG capsule Take 1 capsule (20 mg total) by mouth daily. 30 capsule 2  . Ibuprofen (MOTRIN PO) Take 1 Dose by mouth as needed.    . lamoTRIgine (LAMICTAL) 200 MG tablet Take 1 tablet (200 mg total) by mouth daily. 90 tablet 1  . levETIRAcetam (KEPPRA) 250 MG tablet Take 1 tablet (250 mg total) by mouth 2 (two) times daily. 180 tablet 1  . LORazepam (ATIVAN) 2 MG tablet Take 1 tablet (2 mg total) by mouth 2 (two) times daily as  needed for anxiety. 60 tablet 0  . Multiple Vitamins-Minerals (MULTIVITAMIN PO) Take 1 tablet by mouth daily.    . ondansetron (ZOFRAN) 4 MG tablet Take 1 tablet (4 mg total) by mouth every 8 (eight) hours as needed for nausea. 30 tablet 1   No current facility-administered medications for this visit.      Musculoskeletal: Strength & Muscle Tone: within normal limits Gait & Station: normal Patient leans: N/A  Psychiatric Specialty Exam: ROS  There were no vitals taken for this visit.There is no height or weight on file to calculate BMI.  General Appearance: Casual and Fairly Groomed  Eye Contact:  Fair  Speech:  Normal Rate  Volume:  Normal  Mood:  Anxious, Dysphoric and Irritable  Affect:  Congruent  Thought Process:  Goal Directed and Descriptions of Associations: Tangential  Orientation:  Full (Time, Place, and Person)  Thought Content: Logical   Suicidal Thoughts:  No  Homicidal Thoughts:  No  Memory:  Immediate;   Good  Judgement:  Fair  Insight:  Shallow  Psychomotor Activity:  Normal  Concentration:  Attention Span: Good  Recall:  Fair  Fund of Knowledge: Fair  Language: Fair  Akathisia:  Negative  Handed:  Right  AIMS (if indicated): not done  Assets:  Desire for Improvement Financial Resources/Insurance Housing Social Support  ADL's:  Intact  Cognition: WNL  Sleep:  Fair   Screenings: GAD-7     Office Visit from 08/21/2017 in [facility]  Total GAD-7 Score  18    PHQ2-9     Office Visit from 08/21/2017 in [facility]  PHQ-2 Total Score  6  PHQ-9 Total Score  18      Assessment and Plan:  Jill Alexander presents with ongoing mood lability, anxiety, irritability. Has not had any clear benefit with SSRI and augmentation with abilify and/or rexulti. We agreed to taper off prozac given her nausea with increase to 80 mg.  Will also start vraylar for mood lability and unspecified mood symptoms. Spent time  discussing possible concerns re bipolar affective illness. No discernable epsiodes of mania/psychosis.  No acute SI, HI, AVH.  Discussed low suspicion of bipolar, but ongoing lability has become impairing for day to day activity for patient.  1. Severe episode of recurrent major depressive disorder, without psychotic features (HCC)   2. Autism spectrum disorder   3. Irritable bowel syndrome, unspecified type    Status of current problems: unchanged  Labs Ordered: No orders of the defined types were placed in this encounter.  Labs Reviewed: reviewed labs from pcp  Collateral Obtained/Records Reviewed: mom present,  able to share ongoing issues of patient irritability, snapping at minor annoyances, difficult to know how she will behave and feel day to day, no acute safety concerns  Plan:  Prozac taper given lack of benefit and increase in nausea/gi upset Prozac 20 mg daily Start cariprazine 1.5 mg daily x 1 week, then 3 mg daily thereafter Consider increase of Lamictal above 200 mg RTC 2-3 weeks  I spent 20 minutes with the patient in direct face-to-face clinical care.  Greater than 50% of this time was spent in counseling and coordination of care with the patient.    Burnard LeighAlexander Arya Natalyah Cummiskey, MD 09/29/2017, 12:25 PM

## 2017-10-06 ENCOUNTER — Encounter (INDEPENDENT_AMBULATORY_CARE_PROVIDER_SITE_OTHER): Payer: Self-pay | Admitting: Physician Assistant

## 2017-10-09 ENCOUNTER — Ambulatory Visit: Payer: Medicaid Other | Admitting: Neurology

## 2017-10-09 ENCOUNTER — Encounter: Payer: Self-pay | Admitting: Neurology

## 2017-10-09 VITALS — BP 107/62 | HR 68 | Ht 63.0 in | Wt 142.5 lb

## 2017-10-09 DIAGNOSIS — R569 Unspecified convulsions: Secondary | ICD-10-CM

## 2017-10-09 DIAGNOSIS — Z5181 Encounter for therapeutic drug level monitoring: Secondary | ICD-10-CM

## 2017-10-09 DIAGNOSIS — G250 Essential tremor: Secondary | ICD-10-CM

## 2017-10-09 NOTE — Patient Instructions (Signed)
Reduce the keppra 250 mg tablet to one a day for 4 weeks, then stop.

## 2017-10-09 NOTE — Progress Notes (Signed)
Reason for visit: Seizures  Jill Alexander is an 22 y.o. female  History of present illness:  Ms. Jill Alexander is a 22 year old left-handed white female with a history of Asperger's syndrome and a history of seizures.  The patient is on Keppra, she currently is on 250 mg twice daily, she also takes 200 mg of Lamictal daily.  The patient comes in today indicating that her psychiatry physician wonders if she could come off of the Keppra as this can sometimes worsen anxiety and cause agitation.  The patient has not had any further seizures since last seen.  She continues to have tremors involving both upper extremities.  She returns for an evaluation.  Past Medical History:  Diagnosis Date  . Anxiety   . Autism   . Depression   . Seizures (HCC)   . 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 NO PAST SURGERIES      Family History  Problem Relation Age of Onset  . Depression Father   . Anxiety disorder Sister   . Bipolar disorder Sister     Social history:  reports that she has never smoked. She has never used smokeless tobacco. She reports that she does not drink alcohol or use drugs.   No Known Allergies  Medications:  Prior to Admission medications   Medication Sig Start Date End Date Taking? Authorizing Provider  Acetaminophen (MIDOL PO) Take by mouth.   Yes [provider]  cariprazine (VRAYLAR) capsule Take 1 capsule (3 mg total) by mouth daily. 09/29/17  Yes Eksir, Bo McclintockAlexander Arya, MD  FLUoxetine (PROZAC) 20 MG capsule Take 1 capsule (20 mg total) by mouth daily. 09/29/17 09/29/18 Yes Eksir, Bo McclintockAlexander Arya, MD  Ibuprofen (MOTRIN PO) Take 1 Dose by mouth as needed.   Yes [provider]  lamoTRIgine (LAMICTAL) 200 MG tablet Take 1 tablet (200 mg total) by mouth daily. 07/30/17  Yes Eksir, Bo McclintockAlexander Arya, MD  levETIRAcetam (KEPPRA) 250 MG tablet Take 1 tablet (250 mg total) by mouth 2 (two) times daily. 06/12/17  Yes  York SpanielWillis, Ricky Gallery K, MD  LORazepam (ATIVAN) 2 MG tablet Take 1 tablet (2 mg total) by mouth 2 (two) times daily as needed for anxiety. 09/17/17  Yes Eksir, Bo McclintockAlexander Arya, MD  Multiple Vitamins-Minerals (MULTIVITAMIN PO) Take 1 tablet by mouth daily.   Yes [provider]  ondansetron (ZOFRAN) 4 MG tablet Take 1 tablet (4 mg total) by mouth every 8 (eight) hours as needed for nausea. 09/19/17 09/19/18 Yes Eksir, Bo McclintockAlexander Arya, MD    ROS:  Out of a complete 14 system review of symptoms, the patient complains only of the following symptoms, and all other reviewed systems are negative.  Agitation Depression, anxiety  Blood pressure 107/62, pulse 68, height 5\' 3"  (1.6 m), weight 142 lb 8 oz (64.6 kg).  Physical Exam  General: The patient is alert and cooperative at the time of the examination.  Skin: No significant peripheral edema is noted.   Neurologic Exam  Mental status: The patient is alert and oriented x 3 at the time of the examination. The patient has apparent normal recent and remote memory, with an apparently normal attention span and concentration ability.   Cranial nerves: Facial symmetry is present. Speech is normal, no aphasia or dysarthria is noted. Extraocular movements are full. Visual fields are full.  Motor: The patient has good strength in all 4 extremities.  Sensory examination: Soft touch sensation is symmetric on  the face, arms, and legs.  Coordination: The patient has good finger-nose-finger and heel-to-shin bilaterally.  Patient does have prominent tremors with finger-nose-finger bilaterally.  Gait and station: The patient has a normal gait. Tandem gait is slightly unsteady. Romberg is negative. No drift is seen.  Reflexes: Deep tendon reflexes are symmetric.   Assessment/Plan:  1.  History of seizures, well controlled  2.  Asperger syndrome  3.  Upper extremity tremors  Certainly, the Keppra can significant worsen anxiety in some people.  The  patient is on Lamictal, we will check blood levels today, we will initiate a taper off of the Keppra going to 250 mg at night for 4 weeks and then stop the drug.  The patient will follow-up in 6 months.  If the Lamictal levels are low, we will increase the dose of this medication.  Marlan Palau MD 10/09/2017 3:41 PM  [facility] [address] [place], [place] [address]  Phone [phone] Fax [phone]

## 2017-10-11 ENCOUNTER — Telehealth: Payer: Self-pay | Admitting: Neurology

## 2017-10-11 LAB — LAMOTRIGINE LEVEL: Lamotrigine Lvl: 3.6 ug/mL (ref 2.0–20.0)

## 2017-10-11 MED ORDER — LAMOTRIGINE 150 MG PO TABS: 150.0000 mg | ORAL_TABLET | Freq: Two times a day (BID) | ORAL | 4 refills | Status: DC

## 2017-10-11 NOTE — Telephone Encounter (Signed)
I called the patient.  The Lamictal level is in the low therapeutic range, if we are going to come off of the Keppra, I will increase the Lamictal dose to 150 mg twice daily.  I have called in a prescription for this.  I discussed this with the patient.  She will continue to taper off of Keppra as planned.

## 2017-10-14 ENCOUNTER — Ambulatory Visit (HOSPITAL_COMMUNITY): Payer: Self-pay | Admitting: Licensed Clinical Social Worker

## 2017-10-17 ENCOUNTER — Ambulatory Visit (INDEPENDENT_AMBULATORY_CARE_PROVIDER_SITE_OTHER): Payer: Medicaid Other | Admitting: Psychiatry

## 2017-10-17 ENCOUNTER — Encounter (HOSPITAL_COMMUNITY): Payer: Self-pay | Admitting: Psychiatry

## 2017-10-17 VITALS — BP 122/74 | HR 80 | Ht 62.0 in | Wt 143.0 lb

## 2017-10-17 DIAGNOSIS — F4010 Social phobia, unspecified: Secondary | ICD-10-CM

## 2017-10-17 DIAGNOSIS — F840 Autistic disorder: Secondary | ICD-10-CM | POA: Diagnosis not present

## 2017-10-17 DIAGNOSIS — F332 Major depressive disorder, recurrent severe without psychotic features: Secondary | ICD-10-CM | POA: Diagnosis not present

## 2017-10-17 DIAGNOSIS — F429 Obsessive-compulsive disorder, unspecified: Secondary | ICD-10-CM

## 2017-10-17 MED ORDER — LEVETIRACETAM 250 MG PO TABS: 250.0000 mg | ORAL_TABLET | Freq: Every day | ORAL | 0 refills | Status: DC

## 2017-10-17 NOTE — Patient Instructions (Signed)
STOP Prozac  Remember, in 3 weeks, you should STOP keppra  Continue lamictal 150 mg TWICE a day  Continue Vraylar 3 mg daily  Continue ativan as needed for anxiety

## 2017-10-17 NOTE — Progress Notes (Signed)
BH MD/PA/NP OP Progress Note  10/17/2017 11:35 AM Jill Alexander  MRN:  [id number]  Chief Complaint: okay, little better    HPI: Jill Alexander reports that she does feel a little better with the reduction of Prozac, so we agreed to discontinue.  She has not had any sedation with the cariprazine and has some improvements in her mood stability.  She was recently increased on her dose of Lamictal and Keppra has been tapered with plan to discontinue in 3 weeks, per neurology.  I agree with this plan and I am hopeful that the discontinuation of Keppra may reduce some of her anxiety and irritability.  She continues to use Ativan approximately 6-10 times per week for anxiety or agitation, but does feel like it is less with the addition of vraylar (cariprazine).  Visit Diagnosis:    ICD-10-CM   1. Severe episode of recurrent major depressive disorder, without psychotic features (HCC) F33.2   2. Autism spectrum disorder F84.0   3. Obsessive-compulsive disorder, unspecified type F42.9   4. Social anxiety disorder F40.10     Past Psychiatric History: See intake H&P for full details. Reviewed, with no updates at this time.   Past Medical History:  Past Medical History:  Diagnosis Date  . Anxiety   . Autism   . Depression   . Seizures (HCC)   . 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 NO PAST SURGERIES      Family Psychiatric History: See intake H&P for full details. Reviewed, with no updates at this time.   Family History:  Family History  Problem Relation Age of Onset  . Depression Father   . Anxiety disorder Sister   . Bipolar disorder Sister     Social History:  Social History   Socioeconomic History  . Marital status: Single    Spouse name: Not on file  . Number of children: Not on file  . Years of education: Not on file  . Highest education level: Not on file  Occupational History  . Not on file  Social Needs   . Financial resource strain: Not on file  . Food insecurity:    Worry: Not on file    Inability: Not on file  . Transportation needs:    Medical: Not on file    Non-medical: Not on file  Tobacco Use  . Smoking status: Never Smoker  . Smokeless tobacco: Never Used  Substance and Sexual Activity  . Alcohol use: No  . Drug use: No  . Sexual activity: Never  Lifestyle  . Physical activity:    Days per week: Not on file    Minutes per session: Not on file  . Stress: Not on file  Relationships  . Social connections:    Talks on phone: Not on file    Gets together: Not on file    Attends religious service: Not on file    Active member of club or organization: Not on file    Attends meetings of clubs or organizations: Not on file    Relationship status: Not on file  Other Topics Concern  . Not on file  Social History Narrative   Lives with parents   Caffeine use: 2-3 cups per day   Left handed     Allergies: No Known Allergies  Metabolic Disorder Labs: No results found for: HGBA1C, MPG No results found for: PROLACTIN No results found for: CHOL, TRIG, HDL, CHOLHDL, VLDL,  LDLCALC Lab Results  Component Value Date   TSH 0.677 08/21/2017    Therapeutic Level Labs: No results found for: LITHIUM No results found for: VALPROATE No components found for:  CBMZ  Current Medications: Current Outpatient Medications  Medication Sig Dispense Refill  . Acetaminophen (MIDOL PO) Take by mouth.    . cariprazine (VRAYLAR) capsule Take 1 capsule (3 mg total) by mouth daily. 90 capsule 0  . Ibuprofen (MOTRIN PO) Take 1 Dose by mouth as needed.    . lamoTRIgine (LAMICTAL) 150 MG tablet Take 1 tablet (150 mg total) by mouth 2 (two) times daily. 60 tablet 4  . levETIRAcetam (KEPPRA) 250 MG tablet Take 1 tablet (250 mg total) by mouth at bedtime for 28 days. Script update; Once daily then stop in 4 weeks per neuro 28 tablet 0  . LORazepam (ATIVAN) 2 MG tablet Take 1 tablet (2 mg total) by  mouth 2 (two) times daily as needed for anxiety. 60 tablet 0  . Multiple Vitamins-Minerals (MULTIVITAMIN PO) Take 1 tablet by mouth daily.    . ondansetron (ZOFRAN) 4 MG tablet Take 1 tablet (4 mg total) by mouth every 8 (eight) hours as needed for nausea. 30 tablet 1   No current facility-administered medications for this visit.      Musculoskeletal: Strength & Muscle Tone: within normal limits Gait & Station: normal Patient leans: N/A  Psychiatric Specialty Exam: ROS  Blood pressure 122/74, pulse 80, height 5\' 2"  (1.575 m), weight 143 lb (64.9 kg).Body mass index is 26.16 kg/m.  General Appearance: Casual and Fairly Groomed  Eye Contact:  Fair  Speech:  Normal Rate  Volume:  Normal  Mood:  Anxious and less irritable  Affect:  Congruent  Thought Process:  Goal Directed and Descriptions of Associations: Tangential  Orientation:  Full (Time, Place, and Person)  Thought Content: Logical   Suicidal Thoughts:  No  Homicidal Thoughts:  No  Memory:  Immediate;   Good  Judgement:  Fair  Insight:  Shallow  Psychomotor Activity:  Normal  Concentration:  Attention Span: Good  Recall:  Fair  Fund of Knowledge: Fair  Language: Fair  Akathisia:  Negative  Handed:  Right  AIMS (if indicated): not done  Assets:  Desire for Improvement Financial Resources/Insurance Housing Social Support  ADL's:  Intact  Cognition: WNL  Sleep:  Fair   Screenings: GAD-7     Office Visit from 08/21/2017 in [facility]  Total GAD-7 Score  18    PHQ2-9     Office Visit from 08/21/2017 in [facility]  PHQ-2 Total Score  6  PHQ-9 Total Score  18      Assessment and Plan:  Jill Alexander presents with some improvements with reduction of Prozac, so we agreed to discontinue fully, and will emphasize treatment with cariprazine, and Lamictal per neurology.  She continues to use Ativan for anxiety and irritability, but does feel like she is using it  less since we started cariprazine.  We will follow-up in 3-4 weeks and consider an increase in the cariprazine if needed.  No acute safety concerns or suicidality, and she has good support from her family and individual therapist, Dorathy Daft.  1. Severe episode of recurrent major depressive disorder, without psychotic features (HCC)   2. Autism spectrum disorder   3. Obsessive-compulsive disorder, unspecified type   4. Social anxiety disorder    Status of current problems: unchanged  Labs Ordered: No orders of the  defined types were placed in this encounter.  Labs Reviewed: n/a  Collateral Obtained/Records Reviewed: n/a  Plan:  Cariprazine 3 mg daily  Stop Prozac Ativan 2 mg prn - up to 10 times per week Agree with Lamictal 150 mg twice a day Agree with taper and discontinuation of Keppra RTC 2-3 weeks    Burnard LeighAlexander Arya Eksir, MD 10/17/2017, 11:35 AM

## 2017-10-20 ENCOUNTER — Telehealth (INDEPENDENT_AMBULATORY_CARE_PROVIDER_SITE_OTHER): Payer: Self-pay

## 2017-10-20 ENCOUNTER — Other Ambulatory Visit (INDEPENDENT_AMBULATORY_CARE_PROVIDER_SITE_OTHER): Payer: Self-pay | Admitting: Physician Assistant

## 2017-10-20 DIAGNOSIS — Z124 Encounter for screening for malignant neoplasm of cervix: Secondary | ICD-10-CM

## 2017-10-20 NOTE — Telephone Encounter (Signed)
Patient called to request referral to [facility]. Maryjean Mornempestt S Alayah Knouff, CMA

## 2017-10-20 NOTE — Telephone Encounter (Signed)
Referral has been made.

## 2017-10-21 NOTE — Telephone Encounter (Signed)
Patient aware. Jill Alexander S Jill Alexander, CMA  

## 2017-10-24 ENCOUNTER — Telehealth: Payer: Self-pay | Admitting: General Practice

## 2017-10-24 NOTE — Telephone Encounter (Signed)
Patient notified of appointment scheduled for 11/27/17 at 3:30pm for pap smear.  Patient verbalized understanding.  Appointment reminder mailed to patient.

## 2017-10-31 ENCOUNTER — Encounter (HOSPITAL_COMMUNITY): Payer: Self-pay | Admitting: Psychiatry

## 2017-10-31 DIAGNOSIS — F332 Major depressive disorder, recurrent severe without psychotic features: Secondary | ICD-10-CM

## 2017-10-31 DIAGNOSIS — F840 Autistic disorder: Secondary | ICD-10-CM

## 2017-10-31 MED ORDER — CARIPRAZINE HCL 4.5 MG PO CAPS: 4.5000 mg | ORAL_CAPSULE | Freq: Every day | ORAL | 0 refills | Status: DC

## 2017-11-10 ENCOUNTER — Encounter (HOSPITAL_COMMUNITY): Payer: Self-pay | Admitting: Psychiatry

## 2017-11-10 ENCOUNTER — Ambulatory Visit (INDEPENDENT_AMBULATORY_CARE_PROVIDER_SITE_OTHER): Payer: Medicaid Other | Admitting: Psychiatry

## 2017-11-10 VITALS — BP 110/66 | HR 80 | Ht 63.0 in | Wt 145.0 lb

## 2017-11-10 DIAGNOSIS — F3181 Bipolar II disorder: Secondary | ICD-10-CM | POA: Diagnosis not present

## 2017-11-10 DIAGNOSIS — F0630 Mood disorder due to known physiological condition, unspecified: Secondary | ICD-10-CM

## 2017-11-10 DIAGNOSIS — F332 Major depressive disorder, recurrent severe without psychotic features: Secondary | ICD-10-CM | POA: Diagnosis not present

## 2017-11-10 DIAGNOSIS — Z818 Family history of other mental and behavioral disorders: Secondary | ICD-10-CM

## 2017-11-10 MED ORDER — CARIPRAZINE HCL 4.5 MG PO CAPS: 4.5000 mg | ORAL_CAPSULE | Freq: Every day | ORAL | 0 refills | Status: DC

## 2017-11-10 MED ORDER — LITHIUM CARBONATE ER 300 MG PO TBCR: 300.0000 mg | EXTENDED_RELEASE_TABLET | Freq: Every day | ORAL | 2 refills | Status: DC

## 2017-11-10 MED ORDER — ALPRAZOLAM ER 1 MG PO TB24: 1.0000 mg | ORAL_TABLET | Freq: Every day | ORAL | 0 refills | Status: DC

## 2017-11-10 NOTE — Progress Notes (Signed)
BH MD/PA/NP OP Progress Note  11/10/2017 4:49 PM Jill Alexander  MRN:  [id number]  Chief Complaint: mood up and down   HPI: Jill Alexander presents with her mother for this visit.  Patient has continued to struggle with significant mood lability, can go from calm to crying and yelling and upset for no apparent reason and no associated trigger.  This happens on a daily basis.  Mom expresses her frustration, and reports that this has been going on for so many years and she feels like she is at her wits end.  Spent time with the patient reviewing issues of safety.  She has not engaged in any self injury, and has no intentions to harm herself.  She does have some ongoing passive suicidal thoughts, generally in moments of desperation and anger and frustration.  She is continued on cariprazine 4.5 mg daily, and takes the Ativan usually once a day.  I spent time with her discussing augmentation with lithium given the benefits for mood lability and depressive symptoms, and chronic suicidal thoughts.  I spent ample time with patient and her mom reviewing the substantial risks of lithium, the need for medication monitoring, thyroid monitoring, and kidney monitoring.  They were receptive to medication trial and follow-up in 2 weeks.  We reviewed additional resources to consider including act team, intensive outpatient hospitalization at [facility] as this is part of the Medicaid network, consideration of residential treatment, and inpatient options if acute safety concerns arise.  Visit Diagnosis:    ICD-10-CM   1. Severe episode of recurrent major depressive disorder, without psychotic features (HCC) F33.2 ALPRAZolam (XANAX XR) 1 MG 24 hr tablet  2. Mood disorder in conditions classified elsewhere F06.30 Cariprazine HCl 4.5 MG CAPS    lithium carbonate (LITHOBID) 300 MG CR tablet    ALPRAZolam (XANAX XR) 1 MG 24 hr tablet  3. Bipolar 2 disorder (HCC) F31.81 Cariprazine HCl 4.5 MG CAPS     lithium carbonate (LITHOBID) 300 MG CR tablet    ALPRAZolam (XANAX XR) 1 MG 24 hr tablet    Past Psychiatric History: See intake H&P for full details. Reviewed, with no updates at this time.   Past Medical History:  Past Medical History:  Diagnosis Date  . Anxiety   . Autism   . Depression   . Seizures (HCC)   . 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 NO PAST SURGERIES      Family Psychiatric History: See intake H&P for full details. Reviewed, with no updates at this time.   Family History:  Family History  Problem Relation Age of Onset  . Depression Father   . Anxiety disorder Sister   . Bipolar disorder Sister     Social History:  Social History   Socioeconomic History  . Marital status: Single    Spouse name: Not on file  . Number of children: Not on file  . Years of education: Not on file  . Highest education level: Not on file  Occupational History  . Not on file  Social Needs  . Financial resource strain: Not on file  . Food insecurity:    Worry: Not on file    Inability: Not on file  . Transportation needs:    Medical: Not on file    Non-medical: Not on file  Tobacco Use  . Smoking status: Never Smoker  . Smokeless tobacco: Never Used  Substance and Sexual Activity  .  Alcohol use: No  . Drug use: No  . Sexual activity: Never  Lifestyle  . Physical activity:    Days per week: Not on file    Minutes per session: Not on file  . Stress: Not on file  Relationships  . Social connections:    Talks on phone: Not on file    Gets together: Not on file    Attends religious service: Not on file    Active member of club or organization: Not on file    Attends meetings of clubs or organizations: Not on file    Relationship status: Not on file  Other Topics Concern  . Not on file  Social History Narrative   Lives with parents   Caffeine use: 2-3 cups per day   Left handed     Allergies: No Known  Allergies  Metabolic Disorder Labs: No results found for: HGBA1C, MPG No results found for: PROLACTIN No results found for: CHOL, TRIG, HDL, CHOLHDL, VLDL, LDLCALC Lab Results  Component Value Date   TSH 0.677 08/21/2017    Therapeutic Level Labs: No results found for: LITHIUM No results found for: VALPROATE No components found for:  CBMZ  Current Medications: Current Outpatient Medications  Medication Sig Dispense Refill  . Acetaminophen (MIDOL PO) Take by mouth.    . Cariprazine HCl 4.5 MG CAPS Take 1 capsule (4.5 mg total) by mouth daily. 90 capsule 0  . Ibuprofen (MOTRIN PO) Take 1 Dose by mouth as needed.    . lamoTRIgine (LAMICTAL) 150 MG tablet Take 1 tablet (150 mg total) by mouth 2 (two) times daily. 60 tablet 4  . Multiple Vitamins-Minerals (MULTIVITAMIN PO) Take 1 tablet by mouth daily.    . ondansetron (ZOFRAN) 4 MG tablet Take 1 tablet (4 mg total) by mouth every 8 (eight) hours as needed for nausea. 30 tablet 1  . ALPRAZolam (XANAX XR) 1 MG 24 hr tablet Take 1 tablet (1 mg total) by mouth daily. 30 tablet 0  . lithium carbonate (LITHOBID) 300 MG CR tablet Take 1 tablet (300 mg total) by mouth at bedtime. 30 tablet 2   No current facility-administered medications for this visit.      Musculoskeletal: Strength & Muscle Tone: within normal limits Gait & Station: normal Patient leans: N/A  Psychiatric Specialty Exam: ROS  Blood pressure 110/66, pulse 80, height 5\' 3"  (1.6 m), weight 145 lb (65.8 kg).Body mass index is 25.69 kg/m.  General Appearance: Casual and Fairly Groomed  Eye Contact:  Fair  Speech:  Normal Rate  Volume:  Normal  Mood:  Anxious and Dysphoric  Affect:  Congruent  Thought Process:  Goal Directed and Descriptions of Associations: Tangential  Orientation:  Full (Time, Place, and Person)  Thought Content: Logical   Suicidal Thoughts:  Passive suicidal thoughts, none currently, up and down throughout the week  Homicidal Thoughts:  No   Memory:  Immediate;   Good  Judgement:  Fair  Insight:  Shallow  Psychomotor Activity:  Normal  Concentration:  Attention Span: Good  Recall:  Fair  Fund of Knowledge: Fair  Language: Fair  Akathisia:  Negative  Handed:  Right  AIMS (if indicated): not done  Assets:  Desire for Improvement Financial Resources/Insurance Housing Social Support  ADL's:  Intact  Cognition: WNL  Sleep:  Fair   Screenings: GAD-7     Office Visit from 08/21/2017 in [facility]  Total GAD-7 Score  18    PHQ2-9  [facility]  PHQ-2 Total Score  6  PHQ-9 Total Score  18      Assessment and Plan:  [name] presents with ongoing mood lability, increased suicidal thoughts but no intentions or plans.  We discussed potential psychiatric hospitalization, but family and patient have apprehensions about this.  We agreed to initiate lithium as below for augmentation of cariprazine and mood stabilization.  She continues to take Ativan 1-2 mg daily, I suggested we switch to long-acting Xanax to reduce up and down mood and provide more consistent reduction of anxiety.   We will follow-up in 2 weeks and obtain labs next week.  I have also referred her to IOP at [facility], and provided the name of the therapist in the community that may be able to provide more consistent therapy for the patient's needs.  1. Severe episode of recurrent major depressive disorder, without psychotic features (HCC)   2. Mood disorder in conditions classified elsewhere   3. Bipolar 2 disorder (HCC)    Status of current problems: unchanged  Labs Ordered: No orders of the defined types were placed in this encounter.  Labs Reviewed: n/a  Collateral Obtained/Records Reviewed: n/a  Plan:  Cariprazine 4.5 mg daily  Xanax 1 mg extended release daily Agree with Lamictal 150 mg twice a day Initiate lithium 300 mg nightly; lab draw scheduled  for next Monday RTC 2 weeks  Burnard LeighAlexander Arya Kyen Taite, MD 11/10/2017, 4:49 PM

## 2017-11-10 NOTE — Patient Instructions (Addendum)
Call Dr. Charlyne MomJenna Mendelson at [facility] to learn about fee for service rates for therapy  [facility], [address] [place], [place] [address] Phone: [phone] Fax: [phone]  [facility] [address] [place], [place] [address] TEL: [phone] FAX: [phone]  [facility] [address] [place], [place] [phone]  [facility] [address] [place], [place] [address] Phone [phone]  Fax [phone]

## 2017-11-13 ENCOUNTER — Encounter (HOSPITAL_COMMUNITY): Payer: Self-pay | Admitting: Psychiatry

## 2017-11-17 ENCOUNTER — Ambulatory Visit (HOSPITAL_COMMUNITY): Payer: Medicaid Other

## 2017-11-17 DIAGNOSIS — Z79899 Other long term (current) drug therapy: Secondary | ICD-10-CM

## 2017-11-18 LAB — COMPREHENSIVE METABOLIC PANEL
ALBUMIN: 5 g/dL (ref 3.5–5.5)
ALT: 19 IU/L (ref 0–32)
AST: 25 IU/L (ref 0–40)
Albumin/Globulin Ratio: 2.2 (ref 1.2–2.2)
Alkaline Phosphatase: 54 IU/L (ref 39–117)
BUN/Creatinine Ratio: 14 (ref 9–23)
BUN: 10 mg/dL (ref 6–20)
Bilirubin Total: 0.4 mg/dL (ref 0.0–1.2)
CALCIUM: 9.9 mg/dL (ref 8.7–10.2)
CO2: 21 mmol/L (ref 20–29)
CREATININE: 0.69 mg/dL (ref 0.57–1.00)
Chloride: 102 mmol/L (ref 96–106)
GFR calc non Af Amer: 123 mL/min/{1.73_m2} (ref >59)
GFR, EST AFRICAN AMERICAN: 142 mL/min/{1.73_m2} (ref >59)
Globulin, Total: 2.3 g/dL (ref 1.5–4.5)
Glucose: 80 mg/dL (ref 65–99)
Potassium: 5 mmol/L (ref 3.5–5.2)
Sodium: 141 mmol/L (ref 134–144)
TOTAL PROTEIN: 7.3 g/dL (ref 6.0–8.5)

## 2017-11-18 LAB — CBC WITH DIFFERENTIAL/PLATELET
Basophils Absolute: 0 10*3/uL (ref 0.0–0.2)
Basos: 0 %
EOS (ABSOLUTE): 0.1 10*3/uL (ref 0.0–0.4)
EOS: 2 %
HEMATOCRIT: 43.5 % (ref 34.0–46.6)
HEMOGLOBIN: 14.3 g/dL (ref 11.1–15.9)
IMMATURE GRANS (ABS): 0 10*3/uL (ref 0.0–0.1)
Immature Granulocytes: 0 %
LYMPHS ABS: 2.1 10*3/uL (ref 0.7–3.1)
LYMPHS: 36 %
MCH: 31.5 pg (ref 26.6–33.0)
MCHC: 32.9 g/dL (ref 31.5–35.7)
MCV: 96 fL (ref 79–97)
MONOCYTES: 8 %
Monocytes Absolute: 0.5 10*3/uL (ref 0.1–0.9)
NEUTROS ABS: 3.2 10*3/uL (ref 1.4–7.0)
Neutrophils: 54 %
Platelets: 304 10*3/uL (ref 150–450)
RBC: 4.54 x10E6/uL (ref 3.77–5.28)
RDW: 13 % (ref 12.3–15.4)
WBC: 5.8 10*3/uL (ref 3.4–10.8)

## 2017-11-18 LAB — TSH: TSH: 1.34 u[IU]/mL (ref 0.450–4.500)

## 2017-11-18 LAB — LITHIUM LEVEL: Lithium Lvl: 0.3 mmol/L — ABNORMAL LOW (ref 0.6–1.2)

## 2017-11-19 ENCOUNTER — Encounter: Payer: Self-pay | Admitting: Gastroenterology

## 2017-11-19 ENCOUNTER — Ambulatory Visit (INDEPENDENT_AMBULATORY_CARE_PROVIDER_SITE_OTHER): Payer: Medicaid Other | Admitting: Gastroenterology

## 2017-11-19 VITALS — BP 100/54 | HR 64 | Ht 63.0 in | Wt 142.8 lb

## 2017-11-19 DIAGNOSIS — R1013 Epigastric pain: Secondary | ICD-10-CM

## 2017-11-19 DIAGNOSIS — R110 Nausea: Secondary | ICD-10-CM | POA: Diagnosis not present

## 2017-11-19 DIAGNOSIS — K5900 Constipation, unspecified: Secondary | ICD-10-CM

## 2017-11-19 DIAGNOSIS — K219 Gastro-esophageal reflux disease without esophagitis: Secondary | ICD-10-CM

## 2017-11-19 MED ORDER — OMEPRAZOLE 20 MG PO CPDR: 20.0000 mg | DELAYED_RELEASE_CAPSULE | Freq: Every day | ORAL | 3 refills | Status: DC

## 2017-11-19 MED ORDER — POLYETHYLENE GLYCOL 3350 17 G PO PACK: 17.0000 g | PACK | Freq: Every day | ORAL | 0 refills | Status: AC | PRN

## 2017-11-19 NOTE — Patient Instructions (Signed)
If you are age 24 or older, your body mass index should be between 23-30. Your Body mass index is 25.3 kg/m. If this is out of the aforementioned range listed, please consider follow up with your Primary Care Provider.  If you are age 24 or younger, your body mass index should be between 19-25. Your Body mass index is 25.3 kg/m. If this is out of the aformentioned range listed, please consider follow up with your Primary Care Provider.   Please go to the lab in the basement of our building to have lab work done as you leave today.  After you have submitted your stool sample back to the lab please begin: Omeprazole 20mg : Take once daily  Continue Zofran as needed  Take Miralax daily as needed.  Please call in 1 month if you are not better.  Thank you for entrusting me with your care and for choosing [facility], Dr. Ileene PatrickSteven Armbruster

## 2017-11-19 NOTE — Progress Notes (Signed)
HPI :  22 y/o female with a history of autism, depression, seizure disorder, referred here by Burnard LeighEksir, Alexander Arya for further evaluation of multiple upper tract GI symptoms.  The patient states she thinks she is having reflux symptoms and nausea. She states she does endorse heartburn and regurgitation that can bother her periodically, she thinks is been going on for some time now. It can come and go. She does have some upset stomach and nausea at times, but no vomiting. She does not have any significant dysphagia. Her stomach can bother her in the upper to mid abdomen at times, she states this is more of a discomfort/bloating/gassy feeling, unclear if having a bowel movement provides any benefit. She does endorse some intolerance to gluten, and think she is lactose intolerant given she has dairy intolerance as well. She states she has some altered bowel habits which bother her periodically, occasional loose stools but constipation is more common the loose stools. She denies any blood in her stools.  She denies any known family history of colon cancer or inflammatory bowel disease. She has tried some Pepcid as needed which has helped with her heartburn, as well as Gas-X which seems to help as well. She has been taking some Zofran for periodic nausea which also helps.  She is on the medication regimen as outlined below, she was most recently placed on lithium for the past 1 or 2 weeks. She has been on Lamictal for several months, her dose was altered a few months ago. She also has been on Cariprazine since June or so. She does not use NSAIDs routinely, she does take them for periodic headaches.  Thus far workup has included negative celiac serologies on 09/17/17 along with normal IgA level. She has no history of anemia.    Past Medical History:  Diagnosis Date  . Anxiety   . Autism   . Depression   . GERD (gastroesophageal reflux disease)   . Seizures (HCC)   . Tremor, 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      Past Surgical History:  Procedure Laterality Date  . NO PAST SURGERIES     Family History  Problem Relation Age of Onset  . Depression Father   . Anxiety disorder Sister   . Bipolar disorder Sister   . Diabetes Maternal Grandfather   . Breast cancer Paternal Grandmother   . Uterine cancer Paternal Grandmother   . Irritable bowel syndrome Paternal Grandmother   . Colon cancer Neg Hx    Social History   Tobacco Use  . Smoking status: Never Smoker  . Smokeless tobacco: Never Used  Substance Use Topics  . Alcohol use: No  . Drug use: No   Current Outpatient Medications  Medication Sig Dispense Refill  . acetaminophen (TYLENOL) 500 MG tablet Take 500 mg by mouth every 6 (six) hours as needed.    . ALPRAZolam (XANAX XR) 1 MG 24 hr tablet Take 1 tablet (1 mg total) by mouth daily. 30 tablet 0  . Cariprazine HCl 4.5 MG CAPS Take 1 capsule (4.5 mg total) by mouth daily. 90 capsule 0  . Ibuprofen (MOTRIN PO) Take 1 tablet by mouth as needed.     . lamoTRIgine (LAMICTAL) 150 MG tablet Take 1 tablet (150 mg total) by mouth 2 (two) times daily. 60 tablet 4  . lithium carbonate (LITHOBID) 300 MG CR tablet Take 1 tablet (300 mg total) by mouth at bedtime. 30 tablet 2  . Multiple Vitamins-Minerals (MULTIVITAMIN PO) Take 1 tablet by mouth  daily.    . ondansetron (ZOFRAN) 4 MG tablet Take 1 tablet (4 mg total) by mouth every 8 (eight) hours as needed for nausea. 30 tablet 1  . Acetaminophen (MIDOL PO) Take 2 tablets by mouth as needed (during menstruation).      No current facility-administered medications for this visit.    No Known Allergies   Review of Systems: All systems reviewed and negative except where noted in HPI.    Lab Results  Component Value Date   WBC 5.8 11/17/2017   HGB 14.3 11/17/2017   HCT 43.5 11/17/2017   MCV 96 11/17/2017   PLT 304 11/17/2017    Lab Results  Component Value Date   CREATININE 0.69 11/17/2017   BUN 10 11/17/2017   NA 141 11/17/2017    K 5.0 11/17/2017   CL 102 11/17/2017   CO2 21 11/17/2017    Lab Results  Component Value Date   ALT 19 11/17/2017   AST 25 11/17/2017   ALKPHOS 54 11/17/2017   BILITOT 0.4 11/17/2017     Physical Exam: BP (!) 100/54   Pulse 64   Ht 5\' 3"  (1.6 m)   Wt 142 lb 12.8 oz (64.8 kg)   BMI 25.30 kg/m  Constitutional: Pleasant,well-developed, female in no acute distress. HEENT: Normocephalic and atraumatic. Conjunctivae are normal. No scleral icterus. Neck supple.  Cardiovascular: Normal rate, regular rhythm.  Pulmonary/chest: Effort normal and breath sounds normal. No wheezing, rales or rhonchi. Abdominal: Soft, nondistended, nontender. . There are no masses palpable. No hepatomegaly. Extremities: no edema Lymphadenopathy: No cervical adenopathy noted. Neurological: Alert and oriented to person place and time. Skin: Skin is warm and dry. No rashes noted. Psychiatric: Normal mood and affect. Behavior is normal.   ASSESSMENT AND PLAN: 22 year old female here for new patient assessment of the following issues:  GERD / chronic nausea / dyspepsia / constipation - she has clearly responded to trials of antacids in the past, perhaps reflux is driving a lot of her symptoms. In this light recommend a trial of omeprazole 20 mg once daily for month and see if this helps given she's not had a prior trial of PPI. Her celiac serologies are negative and is reassuring in light of some of her dietary intolerance. I do think testing for H. Pylori is reasonable given hersymptoms, we'll obtain a stool antigen. Of note I counseled her to submit the stool antigen first and not to start the omeprazole until she has submitted the stool antigen given that omeprazole can cause a false negative result. Otherwise she will continue Zofran as needed. For her constipation recommend a trial of MiraLAX daily or as needed. Otherwise, it's quite possible that she is having side effects of some of the medications she is  taking. It sounds like she has been on Lamictal the longest, which can be associated with chronic nausea in 14% of patients, as well as causing abdominal pain and dyspepsia. Will try the regimen as outlined above and see how she responds initially. I asked her to contact us in a month and let me know how she's doing. If symptoms persist despite therapy and workup may consider EGD.   Ileene Patrick, MD [facility]  CC: Burnard Leigh, *

## 2017-11-25 ENCOUNTER — Encounter (HOSPITAL_COMMUNITY): Payer: Self-pay | Admitting: Psychiatry

## 2017-11-25 ENCOUNTER — Ambulatory Visit (INDEPENDENT_AMBULATORY_CARE_PROVIDER_SITE_OTHER): Payer: Medicaid Other | Admitting: Psychiatry

## 2017-11-25 DIAGNOSIS — F3181 Bipolar II disorder: Secondary | ICD-10-CM | POA: Diagnosis not present

## 2017-11-25 DIAGNOSIS — F0630 Mood disorder due to known physiological condition, unspecified: Secondary | ICD-10-CM

## 2017-11-25 DIAGNOSIS — F332 Major depressive disorder, recurrent severe without psychotic features: Secondary | ICD-10-CM | POA: Diagnosis not present

## 2017-11-25 MED ORDER — ALPRAZOLAM ER 2 MG PO TB24: 2.0000 mg | ORAL_TABLET | Freq: Every day | ORAL | 1 refills | Status: DC

## 2017-11-25 NOTE — Progress Notes (Signed)
BH MD/PA/NP OP Progress Note  11/25/2017 3:45 PM Jill Alexander  MRN:  [id number]  Chief Complaint: mood less up and down   HPI: Jill Alexander feels like she is gotten used to the lithium and some of the nausea it was causing.  She does feel much less up and down, and feels like her mood is more stable with the combination of cariprazine, lithium, and Xanax.  She continues to have some breakthrough anxiety and panic and agitation.  We agreed to increase the Xanax to 2 mg extended release given that she was using 2 mg Ativan up to twice a day at times for we switch to the Xanax extended release.  She denies any acute suicidality or unsafe thoughts.  She reports that she has felt more even keeled.  We will follow-up in 2 weeks or sooner if needed.  Visit Diagnosis:    ICD-10-CM   1. Severe episode of recurrent major depressive disorder, without psychotic features (HCC) F33.2 ALPRAZolam (XANAX XR) 2 MG 24 hr tablet  2. Mood disorder in conditions classified elsewhere F06.30 ALPRAZolam (XANAX XR) 2 MG 24 hr tablet  3. Bipolar 2 disorder (HCC) F31.81 ALPRAZolam (XANAX XR) 2 MG 24 hr tablet    Past Psychiatric History: See intake H&P for full details. Reviewed, with no updates at this time.   Past Medical History:  Past Medical History:  Diagnosis Date  . Anxiety   . Autism   . Depression   . GERD (gastroesophageal reflux disease)   . Seizures (HCC)   . 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 NO PAST SURGERIES      Family Psychiatric History: See intake H&P for full details. Reviewed, with no updates at this time.   Family History:  Family History  Problem Relation Age of Onset  . Depression Father   . Anxiety disorder Sister   . Bipolar disorder Sister   . Diabetes Maternal Grandfather   . Breast cancer Paternal Grandmother   . Uterine cancer Paternal Grandmother   . Irritable bowel syndrome Paternal Grandmother   .  Colon cancer Neg Hx     Social History:  Social History   Socioeconomic History  . Marital status: Single    Spouse name: Not on file  . Number of children: Not on file  . Years of education: Not on file  . Highest education level: Not on file  Occupational History  . Not on file  Social Needs  . Financial resource strain: Not on file  . Food insecurity:    Worry: Not on file    Inability: Not on file  . Transportation needs:    Medical: Not on file    Non-medical: Not on file  Tobacco Use  . Smoking status: Never Smoker  . Smokeless tobacco: Never Used  Substance and Sexual Activity  . Alcohol use: No  . Drug use: No  . Sexual activity: Never  Lifestyle  . Physical activity:    Days per week: Not on file    Minutes per session: Not on file  . Stress: Not on file  Relationships  . Social connections:    Talks on phone: Not on file    Gets together: Not on file    Attends religious service: Not on file    Active member of club or organization: Not on file    Attends meetings of clubs or organizations: Not on file  Relationship status: Not on file  Other Topics Concern  . Not on file  Social History Narrative   Lives with parents   Caffeine use: 2-3 cups per day   Left handed     Allergies: No Known Allergies  Metabolic Disorder Labs: No results found for: HGBA1C, MPG No results found for: PROLACTIN No results found for: CHOL, TRIG, HDL, CHOLHDL, VLDL, LDLCALC Lab Results  Component Value Date   TSH 1.340 11/17/2017   TSH 0.677 08/21/2017    Therapeutic Level Labs: Lab Results  Component Value Date   LITHIUM 0.3 (L) 11/17/2017   No results found for: VALPROATE No components found for:  CBMZ  Current Medications: Current Outpatient Medications  Medication Sig Dispense Refill  . Acetaminophen (MIDOL PO) Take 2 tablets by mouth as needed (during menstruation).     Marland Kitchen acetaminophen (TYLENOL) 500 MG tablet Take 500 mg by mouth every 6 (six) hours as  needed.    . ALPRAZolam (XANAX XR) 2 MG 24 hr tablet Take 1 tablet (2 mg total) by mouth daily. 30 tablet 1  . Cariprazine HCl 4.5 MG CAPS Take 1 capsule (4.5 mg total) by mouth daily. 90 capsule 0  . Ibuprofen (MOTRIN PO) Take 1 tablet by mouth as needed.     . lamoTRIgine (LAMICTAL) 150 MG tablet Take 1 tablet (150 mg total) by mouth 2 (two) times daily. 60 tablet 4  . lithium carbonate (LITHOBID) 300 MG CR tablet Take 1 tablet (300 mg total) by mouth at bedtime. 30 tablet 2  . Multiple Vitamins-Minerals (MULTIVITAMIN PO) Take 1 tablet by mouth daily.    Marland Kitchen omeprazole (PRILOSEC) 20 MG capsule Take 1 capsule (20 mg total) by mouth daily. Do not start taking until after you have submitted your stool sample. 30 capsule 3  . ondansetron (ZOFRAN) 4 MG tablet Take 1 tablet (4 mg total) by mouth every 8 (eight) hours as needed for nausea. 30 tablet 1  . polyethylene glycol (MIRALAX) packet Take 17 g by mouth daily as needed. 14 each 0   No current facility-administered medications for this visit.      Musculoskeletal: Strength & Muscle Tone: within normal limits Gait & Station: normal Patient leans: N/A  Psychiatric Specialty Exam: ROS  Blood pressure 112/68, pulse 66, height 5\' 3"  (1.6 m), weight 145 lb (65.8 kg).Body mass index is 25.69 kg/m.  General Appearance: Casual and Fairly Groomed  Eye Contact:  Fair  Speech:  Normal Rate  Volume:  Normal  Mood:  Dysphoric and Euthymic  Affect:  Congruent  Thought Process:  Goal Directed and Descriptions of Associations: Tangential  Orientation:  Full (Time, Place, and Person)  Thought Content: Logical   Suicidal Thoughts:  Passive suicidal thoughts, none currently, up and down throughout the week  Homicidal Thoughts:  No  Memory:  Immediate;   Good  Judgement:  Fair  Insight:  Shallow  Psychomotor Activity:  Normal  Concentration:  Attention Span: Good  Recall:  Fair  Fund of Knowledge: Fair  Language: Chronically poor  Akathisia:   Negative  Handed:  Right  AIMS (if indicated): not done  Assets:  Desire for Improvement Financial Resources/Insurance Housing Social Support  ADL's:  Intact  Cognition: WNL  Sleep:  Fair   Screenings: GAD-7     Office Visit from 08/21/2017 in [facility]  Total GAD-7 Score  18    PHQ2-9     Office Visit from 08/21/2017 in [facility]  PHQ-2 Total Score  6  PHQ-9 Total Score  18      Assessment and Plan:  Jill Alexander presents with gradual improvements in her mood stability on lithium, cariprazine, and Xanax extended release.  The benzodiazepine does seem to be quite helpful in helping her remain generally more calm and less reactive, and weighing risks versus benefits, we agreed to increase to 2 mg as below.  She remains on lithium and cariprazine and we will maintain this current dose at this time.  No acute safety issues and we will follow-up in 2 weeks.  1. Severe episode of recurrent major depressive disorder, without psychotic features (HCC)   2. Mood disorder in conditions classified elsewhere   3. Bipolar 2 disorder (HCC)     Status of current problems: unchanged  Labs Ordered: No orders of the defined types were placed in this encounter.  Labs Reviewed:  Lab Results  Component Value Date   LITHIUM 0.3 (L) 11/17/2017   NA 141 11/17/2017   BUN 10 11/17/2017   CREATININE 0.69 11/17/2017   TSH 1.340 11/17/2017   WBC 5.8 11/17/2017     Collateral Obtained/Records Reviewed: n/a  Plan:  Cariprazine 4.5 mg daily  Increase Xanax to 2 mg extended release daily Agree with Lamictal 150 mg twice a day Initiate lithium 300 mg nightly; we have room to increase to 450 or 600 mg if needed  Burnard LeighAlexander Arya Eksir, MD 11/25/2017, 3:45 PM

## 2017-11-25 NOTE — Patient Instructions (Signed)
Continue Lithium as it is Merchant navy officerContinue Vraylar as it is  Increase extended release xanax to 2 mg tablet (it comes in its own tablet)

## 2017-11-26 ENCOUNTER — Other Ambulatory Visit: Payer: Medicaid Other

## 2017-11-26 DIAGNOSIS — K5900 Constipation, unspecified: Secondary | ICD-10-CM

## 2017-11-26 DIAGNOSIS — K219 Gastro-esophageal reflux disease without esophagitis: Secondary | ICD-10-CM

## 2017-11-26 DIAGNOSIS — R1013 Epigastric pain: Secondary | ICD-10-CM

## 2017-11-26 DIAGNOSIS — R110 Nausea: Secondary | ICD-10-CM

## 2017-11-27 ENCOUNTER — Other Ambulatory Visit: Payer: Self-pay

## 2017-11-27 ENCOUNTER — Ambulatory Visit (INDEPENDENT_AMBULATORY_CARE_PROVIDER_SITE_OTHER): Payer: Medicaid Other | Admitting: Advanced Practice Midwife

## 2017-11-27 ENCOUNTER — Encounter: Payer: Self-pay | Admitting: Advanced Practice Midwife

## 2017-11-27 VITALS — BP 106/69 | HR 75 | Temp 98.8°F | Ht 63.0 in | Wt 144.0 lb

## 2017-11-27 DIAGNOSIS — N926 Irregular menstruation, unspecified: Secondary | ICD-10-CM | POA: Diagnosis not present

## 2017-11-27 DIAGNOSIS — Z3009 Encounter for other general counseling and advice on contraception: Secondary | ICD-10-CM | POA: Diagnosis not present

## 2017-11-27 LAB — HELICOBACTER PYLORI  SPECIAL ANTIGEN
MICRO NUMBER:: 90905500
SPECIMEN QUALITY: ADEQUATE

## 2017-11-27 NOTE — Patient Instructions (Signed)
Oral Contraception Information Oral contraceptive pills (OCPs) are medicines taken to prevent pregnancy. OCPs work by preventing the ovaries from releasing eggs. The hormones in OCPs also cause the cervical mucus to thicken, preventing the sperm from entering the uterus. The hormones also cause the uterine lining to become thin, not allowing a fertilized egg to attach to the inside of the uterus. OCPs are highly effective when taken exactly as prescribed. However, OCPs do not prevent sexually transmitted diseases (STDs). Safe sex practices, such as using condoms along with the pill, can help prevent STDs. Before taking the pill, you may have a physical exam and Pap test. Your health care provider may order blood tests. The health care provider will make sure you are a good candidate for oral contraception. Discuss with your health care provider the possible side effects of the OCP you may be prescribed. When starting an OCP, it can take 2 to 3 months for the body to adjust to the changes in hormone levels in your body. Types of oral contraception  The combination pill-This pill contains estrogen and progestin (synthetic progesterone) hormones. The combination pill comes in 21-day, 28-day, or 91-day packs. Some types of combination pills are meant to be taken continuously (365-day pills). With 21-day packs, you do not take pills for 7 days after the last pill. With 28-day packs, the pill is taken every day. The last 7 pills are without hormones. Certain types of pills have more than 21 hormone-containing pills. With 91-day packs, the first 84 pills contain both hormones, and the last 7 pills contain no hormones or contain estrogen only.  The minipill-This pill contains the progesterone hormone only. The pill is taken every day continuously. It is very important to take the pill at the same time each day. The minipill comes in packs of 28 pills. All 28 pills contain the hormone. Advantages of oral  contraceptive pills  Decreases premenstrual symptoms.  Treats menstrual period cramps.  Regulates the menstrual cycle.  Decreases a heavy menstrual flow.  May treatacne, depending on the type of pill.  Treats abnormal uterine bleeding.  Treats polycystic ovarian syndrome.  Treats endometriosis.  Can be used as emergency contraception. Things that can make oral contraceptive pills less effective OCPs can be less effective if:  You forget to take the pill at the same time every day.  You have a stomach or intestinal disease that lessens the absorption of the pill.  You take OCPs with other medicines that make OCPs less effective, such as antibiotics, certain HIV medicines, and some seizure medicines.  You take expired OCPs.  You forget to restart the pill on day 7, when using the packs of 21 pills.  Risks associated with oral contraceptive pills Oral contraceptive pills can sometimes cause side effects, such as:  Headache.  Nausea.  Breast tenderness.  Irregular bleeding or spotting.  Combination pills are also associated with a small increased risk of:  Blood clots.  Heart attack.  Stroke.  This information is not intended to replace advice given to you by your health care provider. Make sure you discuss any questions you have with your health care provider. Document Released: 07/06/2002 Document Revised: 09/21/2015 Document Reviewed: 10/04/2012 Elsevier Interactive Patient Education  2018 Elsevier Inc.  

## 2017-11-27 NOTE — Progress Notes (Signed)
  Subjective:     Patient ID: Jill Alexander, female   DOB: [birth year], 22 y.o.   MRN: [id number]  Jill Alexander is a 22 y.o. G0 who is here today with irregular menstrual cycles. She has noticed that within the last year her cycles have become more irregular, and that they are ranging about 24-30 days. She has also noticed that her moods have been worse during her periods as well. She has had hx of bipolar disorder and is currently on medication for this. However, she is still having mood swings during her menstrual cycle. She is also concerned about the possibility of PCOS. Patient is not sexually active. She had a pap 2 years ago, and states that it was normal.   Vaginal Bleeding  Primary symptoms comment: irregular periods . This is a new problem. Episode onset: approx 1 year ago. Episode frequency: every 24-30 days  The problem has been unchanged. She is not pregnant. She is not sexually active. She uses nothing for contraception. Her menstrual history has been irregular.     Review of Systems  Genitourinary: Positive for vaginal bleeding.  All other systems reviewed and are negative.      Objective:   Physical Exam  Constitutional: She is oriented to person, place, and time. She appears well-developed and well-nourished. No distress.  HENT:  Head: Normocephalic.  Cardiovascular: Normal rate.  Pulmonary/Chest: Effort normal.  Musculoskeletal: Normal range of motion.  Neurological: She is alert and oriented to person, place, and time.  Skin: Skin is warm and dry.  Psychiatric: She has a normal mood and affect.  Nursing note and vitals reviewed.      Assessment:     1. Irregular menstrual cycle   2. General counseling for prescription of oral contraceptives        Plan:     3 month supply of samples for Taytulla given to patient toda RTO in 3 months to evaluate the effectiveness.

## 2017-12-03 ENCOUNTER — Other Ambulatory Visit (HOSPITAL_COMMUNITY): Payer: Self-pay | Admitting: Psychiatry

## 2017-12-03 ENCOUNTER — Encounter (HOSPITAL_COMMUNITY): Payer: Self-pay | Admitting: Psychiatry

## 2017-12-03 DIAGNOSIS — F0630 Mood disorder due to known physiological condition, unspecified: Secondary | ICD-10-CM

## 2017-12-03 DIAGNOSIS — F3181 Bipolar II disorder: Secondary | ICD-10-CM

## 2017-12-03 MED ORDER — CARIPRAZINE HCL 4.5 MG PO CAPS: 4.5000 mg | ORAL_CAPSULE | Freq: Every day | ORAL | 0 refills | Status: AC

## 2017-12-08 ENCOUNTER — Encounter: Payer: Self-pay | Admitting: General Practice

## 2017-12-09 ENCOUNTER — Encounter (HOSPITAL_COMMUNITY): Payer: Self-pay | Admitting: Psychiatry

## 2017-12-09 ENCOUNTER — Ambulatory Visit (INDEPENDENT_AMBULATORY_CARE_PROVIDER_SITE_OTHER): Payer: Medicaid Other | Admitting: Psychiatry

## 2017-12-09 DIAGNOSIS — Z818 Family history of other mental and behavioral disorders: Secondary | ICD-10-CM

## 2017-12-09 DIAGNOSIS — F3181 Bipolar II disorder: Secondary | ICD-10-CM

## 2017-12-09 DIAGNOSIS — F0630 Mood disorder due to known physiological condition, unspecified: Secondary | ICD-10-CM

## 2017-12-09 DIAGNOSIS — F332 Major depressive disorder, recurrent severe without psychotic features: Secondary | ICD-10-CM | POA: Diagnosis not present

## 2017-12-09 MED ORDER — ALPRAZOLAM ER 1 MG PO TB24: 1.0000 mg | ORAL_TABLET | Freq: Every day | ORAL | 1 refills | Status: DC

## 2017-12-09 MED ORDER — LITHIUM CARBONATE ER 300 MG PO TBCR: 600.0000 mg | EXTENDED_RELEASE_TABLET | Freq: Every day | ORAL | 2 refills | Status: DC

## 2017-12-09 NOTE — Progress Notes (Signed)
BH MD/PA/NP OP Progress Note  12/09/2017 3:06 PM Jill Alexander  MRN:  [id number]  Chief Complaint: mood less up and down   HPI: Jill BickerMadeline Sarah Alexander feels like she is a little bit more agitated on the higher dose of xanax , so we agreed to reduce back to 1 mg with the idea that perhaps the standard release Xanax is disinhibiting her to some degree.  We also agreed to titrate lithium to 600 mg given that she is tolerating the medication well and her gastrointestinal upset has improved.  She recently saw GI and I reviewed the recommendations.  She denies any intentions to harm herself or others but she sometimes has passive thoughts of frustration that she just wants to throw things or break things or hurt the cat if it upsets her.  She has not done any of these things and reports that she knows better than to behave in that way.  We will follow-up in 2 weeks as scheduled and discussed the titration of medications as below in addition to laboratory follow-up in 1 week.  Visit Diagnosis:    ICD-10-CM   1. Severe episode of recurrent major depressive disorder, without psychotic features (HCC) F33.2 ALPRAZolam (XANAX XR) 1 MG 24 hr tablet  2. Mood disorder in conditions classified elsewhere F06.30 ALPRAZolam (XANAX XR) 1 MG 24 hr tablet    lithium carbonate (LITHOBID) 300 MG CR tablet  3. Bipolar 2 disorder (HCC) F31.81 ALPRAZolam (XANAX XR) 1 MG 24 hr tablet    lithium carbonate (LITHOBID) 300 MG CR tablet    Past Psychiatric History: See intake H&P for full details. Reviewed, with no updates at this time.   Past Medical History:  Past Medical History:  Diagnosis Date  . Anxiety   . Autism   . Depression   . GERD (gastroesophageal reflux disease)   . Seizures (HCC)   . 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 NO PAST SURGERIES      Family Psychiatric History: See intake H&P for full details. Reviewed, with no updates at this  time.   Family History:  Family History  Problem Relation Age of Onset  . Depression Father   . Anxiety disorder Sister   . Bipolar disorder Sister   . Diabetes Maternal Grandfather   . Cancer Maternal Grandfather   . Breast cancer Paternal Grandmother   . Uterine cancer Paternal Grandmother   . Irritable bowel syndrome Paternal Grandmother   . Colon cancer Neg Hx     Social History:  Social History   Socioeconomic History  . Marital status: Single    Spouse name: Not on file  . Number of children: Not on file  . Years of education: Not on file  . Highest education level: Not on file  Occupational History  . Not on file  Social Needs  . Financial resource strain: Not on file  . Food insecurity:    Worry: Not on file    Inability: Not on file  . Transportation needs:    Medical: Not on file    Non-medical: Not on file  Tobacco Use  . Smoking status: Never Smoker  . Smokeless tobacco: Never Used  Substance and Sexual Activity  . Alcohol use: No  . Drug use: No  . Sexual activity: Never  Lifestyle  . Physical activity:    Days per week: Not on file    Minutes per session: Not on file  .  Stress: Not on file  Relationships  . Social connections:    Talks on phone: Not on file    Gets together: Not on file    Attends religious service: Not on file    Active member of club or organization: Not on file    Attends meetings of clubs or organizations: Not on file    Relationship status: Not on file  Other Topics Concern  . Not on file  Social History Narrative   Lives with parents   Caffeine use: 2-3 cups per day   Left handed     Allergies: No Known Allergies  Metabolic Disorder Labs: No results found for: HGBA1C, MPG No results found for: PROLACTIN No results found for: CHOL, TRIG, HDL, CHOLHDL, VLDL, LDLCALC Lab Results  Component Value Date   TSH 1.340 11/17/2017   TSH 0.677 08/21/2017    Therapeutic Level Labs: Lab Results  Component Value Date    LITHIUM 0.3 (L) 11/17/2017   No results found for: VALPROATE No components found for:  CBMZ  Current Medications: Current Outpatient Medications  Medication Sig Dispense Refill  . Acetaminophen (MIDOL PO) Take 2 tablets by mouth as needed (during menstruation).     Marland Kitchen. acetaminophen (TYLENOL) 500 MG tablet Take 500 mg by mouth every 6 (six) hours as needed.    . ALPRAZolam (XANAX XR) 1 MG 24 hr tablet Take 1 tablet (1 mg total) by mouth daily. 30 tablet 1  . Cariprazine HCl 4.5 MG CAPS Take 1 capsule (4.5 mg total) by mouth daily. 90 capsule 0  . Ibuprofen (MOTRIN PO) Take 1 tablet by mouth as needed.     . lamoTRIgine (LAMICTAL) 150 MG tablet Take 1 tablet (150 mg total) by mouth 2 (two) times daily. 60 tablet 4  . lithium carbonate (LITHOBID) 300 MG CR tablet Take 2 tablets (600 mg total) by mouth at bedtime. 30 tablet 2  . Multiple Vitamins-Minerals (MULTIVITAMIN PO) Take 1 tablet by mouth daily.    Marland Kitchen. omeprazole (PRILOSEC) 20 MG capsule Take 1 capsule (20 mg total) by mouth daily. Do not start taking until after you have submitted your stool sample. 30 capsule 3  . ondansetron (ZOFRAN) 4 MG tablet Take 1 tablet (4 mg total) by mouth every 8 (eight) hours as needed for nausea. 30 tablet 1  . polyethylene glycol (MIRALAX) packet Take 17 g by mouth daily as needed. 14 each 0   No current facility-administered medications for this visit.      Musculoskeletal: Strength & Muscle Tone: within normal limits Gait & Station: normal Patient leans: N/A  Psychiatric Specialty Exam: ROS  Blood pressure 118/64, pulse 88, height 5\' 3"  (1.6 m), weight 144 lb (65.3 kg), last menstrual period 11/12/2017.Body mass index is 25.51 kg/m.  General Appearance: Casual and Fairly Groomed  Eye Contact:  Fair  Speech:  Normal Rate  Volume:  Normal  Mood:  Dysphoric and Euthymic  Affect:  Congruent  Thought Process:  Goal Directed and Descriptions of Associations: Tangential  Orientation:  Full (Time,  Place, and Person)  Thought Content: Logical   Suicidal Thoughts:  Passive suicidal thoughts, none currently, up and down throughout the week  Homicidal Thoughts:  No  Memory:  Immediate;   Good  Judgement:  Fair  Insight:  Shallow  Psychomotor Activity:  Normal  Concentration:  Attention Span: Good  Recall:  Fair  Fund of Knowledge: Fair  Language: Chronically poor  Akathisia:  Negative  Handed:  Right  AIMS (if  indicated): not done  Assets:  Desire for Improvement Financial Resources/Insurance Housing Social Support  ADL's:  Intact  Cognition: WNL  Sleep:  Fair   Screenings: GAD-7     Office Visit from 08/21/2017 in [facility]  Total GAD-7 Score  18    PHQ2-9     Office Visit from 08/21/2017 in [facility]  PHQ-2 Total Score  6  PHQ-9 Total Score  18      Assessment and Plan:  Kare Dado presents with some flare and disinhibited irritability with the increase in Xanax.  We agreed to proceed as below with reduction of Xanax and titration of lithium to address ongoing mood lability and depressive symptoms, and thoughts of impulsive aggression.  No acute suicidality or intentions to harm anyone else.  We will follow-up in 2 weeks as scheduled.  1. Severe episode of recurrent major depressive disorder, without psychotic features (HCC)   2. Mood disorder in conditions classified elsewhere   3. Bipolar 2 disorder (HCC)     Status of current problems: unchanged  Labs Ordered: No orders of the defined types were placed in this encounter.  Labs Reviewed:  Lab Results  Component Value Date   LITHIUM 0.3 (L) 11/17/2017   NA 141 11/17/2017   BUN 10 11/17/2017   CREATININE 0.69 11/17/2017   TSH 1.340 11/17/2017   WBC 5.8 11/17/2017    Collateral Obtained/Records Reviewed: n/a  Plan:  Cariprazine 4.5 mg daily  Decrease Xanax to 1 mg extended release daily Agree with Lamictal 150 mg twice a day Increase lithium  to 600 mg nightly   Burnard Leigh, MD 12/09/2017, 3:06 PM

## 2017-12-09 NOTE — Patient Instructions (Signed)
Decrease the xanax to 1 mg ER -  Sometimes the higher dose can disinhibit individuals and make them more irritable  Lets increase Lithium to 2 capsules at night - that is 600 mg nightly  Get your Lithium level and kidney function checked again in 1 week  Continue vraylar 4.5 mg daily

## 2017-12-15 ENCOUNTER — Encounter (HOSPITAL_COMMUNITY): Payer: Self-pay

## 2017-12-17 ENCOUNTER — Ambulatory Visit (HOSPITAL_COMMUNITY): Payer: Self-pay

## 2017-12-17 ENCOUNTER — Ambulatory Visit (INDEPENDENT_AMBULATORY_CARE_PROVIDER_SITE_OTHER): Payer: Medicaid Other | Admitting: Physician Assistant

## 2017-12-17 ENCOUNTER — Encounter (INDEPENDENT_AMBULATORY_CARE_PROVIDER_SITE_OTHER): Payer: Self-pay | Admitting: Physician Assistant

## 2017-12-17 VITALS — BP 95/57 | HR 68 | Temp 98.2°F | Ht 63.0 in | Wt 144.0 lb

## 2017-12-17 DIAGNOSIS — R002 Palpitations: Secondary | ICD-10-CM | POA: Diagnosis not present

## 2017-12-17 NOTE — Progress Notes (Signed)
Subjective:  Patient ID: Jill Alexander, female    DOB: 24-Mar-1996  Age: 22 y.o. MRN: [id number]  CC: irregular heart beat  HPI  Jill Alexander is a 22 y.o. female with a medical history of anxiety, autism, depression, seizures, VSD, and essential tremor presents with palpitations Usually felt upon awakening in the morning or when she lays down to bed at night time. Sometimes notices the palpitations during the day if she is not "super distracted". Palpitations may last "maybe more than ten minutes, maybe an hour". Says she can hear her heart beating. Does not endorse any other associated symptoms such as CP, HA, SOB, dizziness, or diaphoresis. Does not feel she is anxious. No hematochezia, hemoptysis, hematuria, or excessive vaginal bleeding. TSH normal last month. Not sexually active, not concerned for pregnancy.     Outpatient Medications Prior to Visit  Medication Sig Dispense Refill  . Acetaminophen (MIDOL PO) Take 2 tablets by mouth as needed (during menstruation).     Marland Kitchen. acetaminophen (TYLENOL) 500 MG tablet Take 500 mg by mouth every 6 (six) hours as needed.    . ALPRAZolam (XANAX XR) 1 MG 24 hr tablet Take 1 tablet (1 mg total) by mouth daily. 30 tablet 1  . Cariprazine HCl 4.5 MG CAPS Take 1 capsule (4.5 mg total) by mouth daily. 90 capsule 0  . Ibuprofen (MOTRIN PO) Take 1 tablet by mouth as needed.     . lamoTRIgine (LAMICTAL) 150 MG tablet Take 1 tablet (150 mg total) by mouth 2 (two) times daily. 60 tablet 4  . lithium carbonate (LITHOBID) 300 MG CR tablet Take 2 tablets (600 mg total) by mouth at bedtime. 30 tablet 2  . Multiple Vitamins-Minerals (MULTIVITAMIN PO) Take 1 tablet by mouth daily.    . Norethin Ace-Eth Estrad-FE (TAYTULLA) 1-20 MG-MCG(24) CAPS     . omeprazole (PRILOSEC) 20 MG capsule Take 1 capsule (20 mg total) by mouth daily. Do not start taking until after you have submitted your stool sample. 30 capsule 3  . ondansetron (ZOFRAN) 4 MG tablet Take 1  tablet (4 mg total) by mouth every 8 (eight) hours as needed for nausea. 30 tablet 1  . polyethylene glycol (MIRALAX) packet Take 17 g by mouth daily as needed. 14 each 0   No facility-administered medications prior to visit.      ROS Review of Systems  Constitutional: Negative for chills, fever and malaise/fatigue.  Eyes: Negative for blurred vision.  Respiratory: Negative for shortness of breath.   Cardiovascular: Positive for palpitations. Negative for chest pain.  Gastrointestinal: Negative for abdominal pain and nausea.  Genitourinary: Negative for dysuria and hematuria.  Musculoskeletal: Negative for joint pain and myalgias.  Skin: Negative for rash.  Neurological: Negative for tingling and headaches.  Psychiatric/Behavioral: Negative for depression. The patient is not nervous/anxious.     Objective:  BP (!) 95/57   Pulse 68   Temp 98.2 F (36.8 C) (Oral)   Ht 5\' 3"  (1.6 m)   Wt 144 lb (65.3 kg)   SpO2 100%   BMI 25.51 kg/m   BP/Weight 12/17/2017 11/27/2017 11/19/2017  Systolic BP 95 106 100  Diastolic BP 57 69 54  Wt. (Lbs) 144 144 142.8  BMI 25.51 25.51 25.3  Some encounter information is confidential and restricted. Go to Review Flowsheets activity to see all data.      Physical Exam  Constitutional: She is oriented to person, place, and time.  Well developed, well nourished, NAD, polite  HENT:  Head: Normocephalic and atraumatic.  Eyes: No scleral icterus.  Neck: Normal range of motion. Neck supple. No thyromegaly present.  Cardiovascular: Normal rate, regular rhythm and normal heart sounds.  Pulmonary/Chest: Effort normal and breath sounds normal.  Musculoskeletal: She exhibits no edema.  Neurological: She is alert and oriented to person, place, and time.  Skin: Skin is warm and dry. No rash noted. No erythema. No pallor.  Psychiatric: She has a normal mood and affect. Her behavior is normal. Thought content normal.  Vitals reviewed.    Assessment &  Plan:    1. Palpitations - EKG 12-Lead with poor R wave progression, T-wave inversion in V1 - Lithium level - CBC with Differential - Basic Metabolic Panel - Ambulatory referral to cardiology.  - Advised to abstain from activities and emotional situations that will increase heart rate and thus increase chance for arrhythmias.   Follow-up: Return in about 4 weeks (around 01/14/2018) for palpitations.   Loletta Specteroger David Gomez PA

## 2017-12-17 NOTE — Patient Instructions (Addendum)
Please abstain from physical/strenuous activities. Do not become angry/emotional so as to not raise the heart rate.    Palpitations A palpitation is the feeling that your heart:  Has an uneven (irregular) heartbeat.  Is beating faster than normal.  Is fluttering.  Is skipping a beat.  This is usually not a serious problem. In some cases, you may need more medical tests. Follow these instructions at home:  Avoid: ? Caffeine in coffee, tea, soft drinks, diet pills, and energy drinks. ? Chocolate. ? Alcohol.  Do not use any tobacco products. These include cigarettes, chewing tobacco, and e-cigarettes. If you need help quitting, ask your doctor.  Try to reduce your stress. These things may help: ? Yoga. ? Meditation. ? Physical activity. Swimming, jogging, and walking are good choices. ? A method that helps you use your mind to control things in your body, like heartbeats (biofeedback).  Get plenty of rest and sleep.  Take over-the-counter and prescription medicines only as told by your doctor.  Keep all follow-up visits as told by your doctor. This is important. Contact a doctor if:  Your heartbeat is still fast or uneven after 24 hours.  Your palpitations occur more often. Get help right away if:  You have chest pain.  You feel short of breath.  You have a very bad headache.  You feel dizzy.  You pass out (faint). This information is not intended to replace advice given to you by your health care provider. Make sure you discuss any questions you have with your health care provider. Document Released: 01/23/2008 Document Revised: 09/21/2015 Document Reviewed: 12/29/2014 Elsevier Interactive Patient Education  [employer]2018 [employer] Inc[employer]

## 2017-12-17 NOTE — Progress Notes (Signed)
Patient feel she has an irregular heartbeat.

## 2017-12-18 ENCOUNTER — Ambulatory Visit (HOSPITAL_COMMUNITY): Payer: Medicaid Other

## 2017-12-18 DIAGNOSIS — Z79899 Other long term (current) drug therapy: Secondary | ICD-10-CM

## 2017-12-18 LAB — BASIC METABOLIC PANEL
BUN/Creatinine Ratio: 14 (ref 9–23)
BUN: 9 mg/dL (ref 6–20)
CALCIUM: 9.6 mg/dL (ref 8.7–10.2)
CO2: 23 mmol/L (ref 20–29)
Chloride: 103 mmol/L (ref 96–106)
Creatinine, Ser: 0.66 mg/dL (ref 0.57–1.00)
GFR calc Af Amer: 144 mL/min/{1.73_m2} (ref >59)
GFR, EST NON AFRICAN AMERICAN: 125 mL/min/{1.73_m2} (ref >59)
Glucose: 95 mg/dL (ref 65–99)
POTASSIUM: 4.4 mmol/L (ref 3.5–5.2)
Sodium: 141 mmol/L (ref 134–144)

## 2017-12-18 LAB — CBC WITH DIFFERENTIAL/PLATELET
Basophils Absolute: 0 10*3/uL (ref 0.0–0.2)
Basos: 0 %
EOS (ABSOLUTE): 0.1 10*3/uL (ref 0.0–0.4)
EOS: 1 %
HEMATOCRIT: 40.1 % (ref 34.0–46.6)
Hemoglobin: 13.5 g/dL (ref 11.1–15.9)
IMMATURE GRANS (ABS): 0 10*3/uL (ref 0.0–0.1)
Immature Granulocytes: 0 %
LYMPHS: 24 %
Lymphocytes Absolute: 2.2 10*3/uL (ref 0.7–3.1)
MCH: 31.4 pg (ref 26.6–33.0)
MCHC: 33.7 g/dL (ref 31.5–35.7)
MCV: 93 fL (ref 79–97)
MONOS ABS: 0.7 10*3/uL (ref 0.1–0.9)
Monocytes: 7 %
NEUTROS PCT: 68 %
Neutrophils Absolute: 6.4 10*3/uL (ref 1.4–7.0)
Platelets: 300 10*3/uL (ref 150–450)
RBC: 4.3 x10E6/uL (ref 3.77–5.28)
RDW: 13.2 % (ref 12.3–15.4)
WBC: 9.4 10*3/uL (ref 3.4–10.8)

## 2017-12-18 LAB — LITHIUM LEVEL: Lithium Lvl: 0.3 mmol/L — ABNORMAL LOW (ref 0.6–1.2)

## 2017-12-19 ENCOUNTER — Telehealth (INDEPENDENT_AMBULATORY_CARE_PROVIDER_SITE_OTHER): Payer: Self-pay

## 2017-12-19 ENCOUNTER — Other Ambulatory Visit: Payer: Self-pay

## 2017-12-19 ENCOUNTER — Telehealth: Payer: Self-pay | Admitting: Gastroenterology

## 2017-12-19 MED ORDER — PROMETHAZINE HCL 12.5 MG PO TABS: 12.5000 mg | ORAL_TABLET | Freq: Every day | ORAL | 1 refills | Status: DC

## 2017-12-19 NOTE — Telephone Encounter (Signed)
Jill FanningJulie she had done well on this regimen in the past. Why doesn't she try increasing the omeprazole to twice daily for a few weeks to see if that helps. If she's nauseated in the AM and Zofran doesn't help, we can try some phenergan 12.5mg  to see if that helps. She can try taking that at night before bed in attempt to reduce the AM nausea, and use it during the day PRN every 8 hrs. Thanks

## 2017-12-19 NOTE — Telephone Encounter (Signed)
Patient is aware that labs do not show reason for palpitations. She is aware that her lithium level is low again and advised to speak with psychiatrist about lithium dose. Patient advised to monitor phone for cardiologist call. Patient expressed understanding. Maryjean Mornempestt S Aydn Ferrara, CMA

## 2017-12-19 NOTE — Telephone Encounter (Signed)
Patient advised of recommendations. She will try increasing the Prilosec 20 mg to BID and add the phenergan 12.5 mg qhs and prn every 8 hours if needed. Cautioned that it will make her drowsy. She will call back in 2 weeks to give update.

## 2017-12-19 NOTE — Telephone Encounter (Signed)
Patient states that she is still having nausea, some reflux especially in the morning. Takes her Prilosec 20 mg in the morning. She states the zofran does not help.

## 2017-12-19 NOTE — Telephone Encounter (Signed)
-----   Message from Loletta Specteroger David Gomez, PA-C sent at 12/19/2017  8:29 AM EDT ----- Labs not showing reason for palpitations. Lithium level is once again low. She may want to speak to her psychiatrist about her lithium dose. Tell her to monitor her phone for the cardiologist's call.

## 2017-12-20 LAB — COMPREHENSIVE METABOLIC PANEL
A/G RATIO: 2.2 (ref 1.2–2.2)
ALT: 16 IU/L (ref 0–32)
AST: 27 IU/L (ref 0–40)
Albumin: 4.8 g/dL (ref 3.5–5.5)
Alkaline Phosphatase: 49 IU/L (ref 39–117)
BUN / CREAT RATIO: 9 (ref 9–23)
BUN: 7 mg/dL (ref 6–20)
Bilirubin Total: 0.4 mg/dL (ref 0.0–1.2)
CALCIUM: 9.7 mg/dL (ref 8.7–10.2)
CO2: 21 mmol/L (ref 20–29)
Chloride: 106 mmol/L (ref 96–106)
Creatinine, Ser: 0.75 mg/dL (ref 0.57–1.00)
GFR, EST AFRICAN AMERICAN: 130 mL/min/{1.73_m2} (ref >59)
GFR, EST NON AFRICAN AMERICAN: 113 mL/min/{1.73_m2} (ref >59)
GLOBULIN, TOTAL: 2.2 g/dL (ref 1.5–4.5)
Glucose: 79 mg/dL (ref 65–99)
POTASSIUM: 5.1 mmol/L (ref 3.5–5.2)
SODIUM: 142 mmol/L (ref 134–144)
Total Protein: 7 g/dL (ref 6.0–8.5)

## 2017-12-20 LAB — LITHIUM LEVEL: Lithium Lvl: 0.5 mmol/L — ABNORMAL LOW (ref 0.6–1.2)

## 2017-12-23 ENCOUNTER — Encounter (HOSPITAL_COMMUNITY): Payer: Self-pay | Admitting: Psychiatry

## 2017-12-23 ENCOUNTER — Ambulatory Visit (INDEPENDENT_AMBULATORY_CARE_PROVIDER_SITE_OTHER): Payer: Medicaid Other | Admitting: Psychiatry

## 2017-12-23 DIAGNOSIS — F3181 Bipolar II disorder: Secondary | ICD-10-CM | POA: Diagnosis not present

## 2017-12-23 DIAGNOSIS — F0630 Mood disorder due to known physiological condition, unspecified: Secondary | ICD-10-CM

## 2017-12-23 DIAGNOSIS — F332 Major depressive disorder, recurrent severe without psychotic features: Secondary | ICD-10-CM

## 2017-12-23 MED ORDER — ALPRAZOLAM ER 1 MG PO TB24: 1.0000 mg | ORAL_TABLET | Freq: Two times a day (BID) | ORAL | 1 refills | Status: DC | PRN

## 2017-12-23 MED ORDER — LITHIUM CARBONATE ER 300 MG PO TBCR: 900.0000 mg | EXTENDED_RELEASE_TABLET | Freq: Every day | ORAL | 2 refills | Status: DC

## 2017-12-23 NOTE — Progress Notes (Signed)
BH MD/PA/NP OP Progress Note  12/23/2017 11:52 AM Jill Alexander  MRN:  [id number]  Chief Complaint: mood still up and down   HPI: Jill Alexander presents with her mother.  Patient continues to struggle with mood lability.  Patient's mother describes that the patient continues to be incredibly tearful, irritable, and her mood continues to be unpredictable throughout the day.  No violence or physical aggression towrads self or others.  We discussed increasing lithium to the full therapeutic dose of 900 mg nightly.  They will follow-up in 1 week to get a lithium level and CMP.  No acute safety issues or suicidality.  They understand this will be our final visit prior to transition to Dr. Michae Kava.  She is going to be starting therapy with Dr. Dewayne Hatch in about 3 weeks and we spent time discussing the importance of coping strategies and behavioral changes.  She continues on alprazolam extended release and cariprazine.   Visit Diagnosis:    ICD-10-CM   1. Mood disorder in conditions classified elsewhere F06.30 lithium carbonate (LITHOBID) 300 MG CR tablet    ALPRAZolam (XANAX XR) 1 MG 24 hr tablet  2. Bipolar 2 disorder (HCC) F31.81 lithium carbonate (LITHOBID) 300 MG CR tablet    ALPRAZolam (XANAX XR) 1 MG 24 hr tablet  3. Severe episode of recurrent major depressive disorder, without psychotic features (HCC) F33.2 ALPRAZolam (XANAX XR) 1 MG 24 hr tablet    Past Psychiatric History: See intake H&P for full details. Reviewed, with no updates at this time.   Past Medical History:  Past Medical History:  Diagnosis Date  . Anxiety   . Autism   . Depression   . GERD (gastroesophageal reflux disease)   . Seizures (HCC)   . 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 NO PAST SURGERIES      Family Psychiatric History: See intake H&P for full details. Reviewed, with no updates at this time.   Family History:  Family History   Problem Relation Age of Onset  . Depression Father   . Anxiety disorder Sister   . Bipolar disorder Sister   . Diabetes Maternal Grandfather   . Cancer Maternal Grandfather   . Breast cancer Paternal Grandmother   . Uterine cancer Paternal Grandmother   . Irritable bowel syndrome Paternal Grandmother   . Colon cancer Neg Hx     Social History:  Social History   Socioeconomic History  . Marital status: Single    Spouse name: Not on file  . Number of children: Not on file  . Years of education: Not on file  . Highest education level: Not on file  Occupational History  . Not on file  Social Needs  . Financial resource strain: Not on file  . Food insecurity:    Worry: Not on file    Inability: Not on file  . Transportation needs:    Medical: Not on file    Non-medical: Not on file  Tobacco Use  . Smoking status: Never Smoker  . Smokeless tobacco: Never Used  Substance and Sexual Activity  . Alcohol use: No  . Drug use: No  . Sexual activity: Never  Lifestyle  . Physical activity:    Days per week: Not on file    Minutes per session: Not on file  . Stress: Not on file  Relationships  . Social connections:    Talks on phone: Not on file  Gets together: Not on file    Attends religious service: Not on file    Active member of club or organization: Not on file    Attends meetings of clubs or organizations: Not on file    Relationship status: Not on file  Other Topics Concern  . Not on file  Social History Narrative   Lives with parents   Caffeine use: 2-3 cups per day   Left handed     Allergies: No Known Allergies  Metabolic Disorder Labs: No results found for: HGBA1C, MPG No results found for: PROLACTIN No results found for: CHOL, TRIG, HDL, CHOLHDL, VLDL, LDLCALC Lab Results  Component Value Date   TSH 1.340 11/17/2017   TSH 0.677 08/21/2017    Therapeutic Level Labs: Lab Results  Component Value Date   LITHIUM 0.5 (L) 12/18/2017   LITHIUM 0.3  (L) 12/17/2017   No results found for: VALPROATE No components found for:  CBMZ  Current Medications: Current Outpatient Medications  Medication Sig Dispense Refill  . Acetaminophen (MIDOL PO) Take 2 tablets by mouth as needed (during menstruation).     Marland Kitchen acetaminophen (TYLENOL) 500 MG tablet Take 500 mg by mouth every 6 (six) hours as needed.    . ALPRAZolam (XANAX XR) 1 MG 24 hr tablet Take 1 tablet (1 mg total) by mouth 2 (two) times daily as needed for anxiety. 60 tablet 1  . Cariprazine HCl 4.5 MG CAPS Take 1 capsule (4.5 mg total) by mouth daily. 90 capsule 0  . Ibuprofen (MOTRIN PO) Take 1 tablet by mouth as needed.     . lamoTRIgine (LAMICTAL) 150 MG tablet Take 1 tablet (150 mg total) by mouth 2 (two) times daily. 60 tablet 4  . lithium carbonate (LITHOBID) 300 MG CR tablet Take 3 tablets (900 mg total) by mouth at bedtime. 90 tablet 2  . Multiple Vitamins-Minerals (MULTIVITAMIN PO) Take 1 tablet by mouth daily.    . Norethin Ace-Eth Estrad-FE (TAYTULLA) 1-20 MG-MCG(24) CAPS     . omeprazole (PRILOSEC) 20 MG capsule Take 1 capsule (20 mg total) by mouth daily. Do not start taking until after you have submitted your stool sample. 30 capsule 3  . ondansetron (ZOFRAN) 4 MG tablet Take 1 tablet (4 mg total) by mouth every 8 (eight) hours as needed for nausea. 30 tablet 1  . polyethylene glycol (MIRALAX) packet Take 17 g by mouth daily as needed. 14 each 0  . promethazine (PHENERGAN) 12.5 MG tablet Take 1 tablet (12.5 mg total) by mouth at bedtime. May also take 1 tablet every 8 hours as needed for nausea. 30 tablet 1   No current facility-administered medications for this visit.      Musculoskeletal: Strength & Muscle Tone: within normal limits Gait & Station: normal Patient leans: N/A  Psychiatric Specialty Exam: ROS  Blood pressure 100/64, pulse 74, height 5' 2.75" (1.594 m), weight 146 lb (66.2 kg), SpO2 97 %.Body mass index is 26.07 kg/m.  General Appearance: Casual and  Fairly Groomed  Eye Contact:  Fair  Speech:  Normal Rate  Volume:  Normal  Mood:  Dysphoric and Euthymic  Affect:  Congruent  Thought Process:  Goal Directed and Descriptions of Associations: Tangential  Orientation:  Full (Time, Place, and Person)  Thought Content: Logical   Suicidal Thoughts:  No  Homicidal Thoughts:  No  Memory:  Immediate;   Good  Judgement:  Fair  Insight:  Shallow  Psychomotor Activity:  Normal  Concentration:  Attention Span: Good  Recall:  Jill HumanFair  Fund of Knowledge: Fair  Language: Chronically poor  Akathisia:  Negative  Handed:  Right  AIMS (if indicated): not done  Assets:  Desire for Improvement Financial Resources/Insurance Housing Social Support  ADL's:  Intact  Cognition: WNL  Sleep:  Fair   Screenings: GAD-7     [facility]  Total GAD-7 Score  14  18    PHQ2-9     [facility]  PHQ-2 Total Score  5  6  PHQ-9 Total Score  17  18      Assessment and Plan:  Jill Alexander presents with ongoing difficulty with mood stabilization.  We agreed to increase the lithium as below, and she remains on cariprazine and Xanax.  She will have a transition of care visit with Dr. Michae KavaAgarwal in the coming weeks.  Patient and mom are aware of emergency resources if there is any acute suicidality or unsafe behaviors.  Patient has signed up for class at the community college locally and is looking forward to this  1. Mood disorder in conditions classified elsewhere   2. Bipolar 2 disorder (HCC)   3. Severe episode of recurrent major depressive disorder, without psychotic features (HCC)    Status of current problems: unchanged  Labs Ordered: No orders of the defined types were placed in this encounter.  Labs Reviewed:   Lab Results  Component Value Date   LITHIUM 0.5 (L) 12/18/2017   NA 142 12/18/2017   BUN 7 12/18/2017   CREATININE 0.75 12/18/2017   TSH 1.340 11/17/2017   WBC 9.4 12/17/2017    Collateral Obtained/Records Reviewed: n/a  Plan:  Cariprazine 4.5 mg daily  Xanax 1 mg extended release up to twice a a day Agree with Lamictal 150 mg twice a day Increase lithium to 900 mg nightly  Transition to Dr. Edmonia JamesAgarwal   Alexander Arya Eksir, MD 12/23/2017, 11:52 AM

## 2017-12-23 NOTE — Patient Instructions (Signed)
[email or web address] [address] [place], [place] [address] Phone - [phone] Fax - [phone] 

## 2017-12-31 ENCOUNTER — Encounter: Payer: Self-pay | Admitting: Cardiology

## 2017-12-31 ENCOUNTER — Ambulatory Visit: Payer: Medicaid Other | Admitting: Cardiology

## 2017-12-31 VITALS — BP 94/60 | HR 87 | Ht 62.75 in | Wt 146.0 lb

## 2017-12-31 DIAGNOSIS — R002 Palpitations: Secondary | ICD-10-CM

## 2017-12-31 DIAGNOSIS — Q210 Ventricular septal defect: Secondary | ICD-10-CM

## 2017-12-31 NOTE — Progress Notes (Signed)
Cardiology Office Note:    Date:  12/31/2017   ID:  Jill Alexander, DOB 01/10/1996, MRN [id number]  PCP:  Jill Specter, PA-C  Cardiologist:  No primary care provider on file.   Referring MD: Jill Specter, PA-C    History of Present Illness:    Jill Alexander is a 22 y.o. female here for evaluation of irregular heartbeats at the request of Jill Messing, PA  EKG reviewed from 12/17/2017 shows sinus rhythm with J-point elevation inferior lateral leads, nonspecific T wave inversion.  She does take lithium prescribed by psychiatrist.  Lithium level was 0.5, low she was worried about potential irregular heartbeats, palpitations.  She does have a history of anxiety, autism, depression, seizures and small apical ventricular septal defect with no evidence of right ventricular changes, tremor.  Sometimes when she is laying down at night right before she goes to bed she can feel palpitations.  They may be or about 10 minutes duration.  She can hear her heart beating.  This makes her nervous.  There are no prolonged racing heartbeats.  No syncope.  She does drink a little bit of caffeine, coffee occasionally.  No excessive nature.  No smoking.  No alcohol.  No syncope, no chest pain.   Past Medical History:  Diagnosis Date  . Anxiety   . Autism   . Depression   . GERD (gastroesophageal reflux disease)   . Seizures (HCC)   . 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 NO PAST SURGERIES      Current Medications: Current Meds  Medication Sig  . Acetaminophen (MIDOL PO) Take 2 tablets by mouth as needed (during menstruation).   Marland Kitchen acetaminophen (TYLENOL) 500 MG tablet Take 500 mg by mouth every 6 (six) hours as needed.  . ALPRAZolam (XANAX XR) 1 MG 24 hr tablet Take 1 tablet (1 mg total) by mouth 2 (two) times daily as needed for anxiety.  . Cariprazine HCl 4.5 MG CAPS Take 1 capsule (4.5 mg total) by mouth daily.  . Ibuprofen  (MOTRIN PO) Take 1 tablet by mouth as needed.   . lamoTRIgine (LAMICTAL) 150 MG tablet Take 1 tablet (150 mg total) by mouth 2 (two) times daily.  Marland Kitchen lithium carbonate (LITHOBID) 300 MG CR tablet Take 3 tablets (900 mg total) by mouth at bedtime.  . Multiple Vitamins-Minerals (MULTIVITAMIN PO) Take 1 tablet by mouth daily.  . Norethin Ace-Eth Estrad-FE (TAYTULLA) 1-20 MG-MCG(24) CAPS   . omeprazole (PRILOSEC) 20 MG capsule Take 1 capsule (20 mg total) by mouth daily. Do not start taking until after you have submitted your stool sample. (Patient taking differently: Take 20 mg by mouth 2 (two) times daily. Do not start taking until after you have submitted your stool sample.)  . ondansetron (ZOFRAN) 4 MG tablet Take 1 tablet (4 mg total) by mouth every 8 (eight) hours as needed for nausea.  . polyethylene glycol (MIRALAX) packet Take 17 g by mouth daily as needed.  . promethazine (PHENERGAN) 12.5 MG tablet Take 1 tablet (12.5 mg total) by mouth at bedtime. May also take 1 tablet every 8 hours as needed for nausea.     Allergies:   Patient has no known allergies.   Social History   Socioeconomic History  . Marital status: Single    Spouse name: Not on file  . Number of children: Not on file  . Years of education: Not on file  . Highest  education level: Not on file  Occupational History  . Not on file  Social Needs  . Financial resource strain: Not on file  . Food insecurity:    Worry: Not on file    Inability: Not on file  . Transportation needs:    Medical: Not on file    Non-medical: Not on file  Tobacco Use  . Smoking status: Never Smoker  . Smokeless tobacco: Never Used  Substance and Sexual Activity  . Alcohol use: No  . Drug use: No  . Sexual activity: Never  Lifestyle  . Physical activity:    Days per week: Not on file    Minutes per session: Not on file  . Stress: Not on file  Relationships  . Social connections:    Talks on phone: Not on file    Gets together: Not  on file    Attends religious service: Not on file    Active member of club or organization: Not on file    Attends meetings of clubs or organizations: Not on file    Relationship status: Not on file  Other Topics Concern  . Not on file  Social History Narrative   Lives with parents   Caffeine use: 2-3 cups per day   Left handed      Family History: The patient's family history includes Anxiety disorder in her sister; Bipolar disorder in her sister; Breast cancer in her paternal grandmother; Cancer in her maternal grandfather; Depression in her father; Diabetes in her maternal grandfather; Irritable bowel syndrome in her paternal grandmother; Uterine cancer in her paternal grandmother. There is no history of Colon cancer.  ROS:   Please see the history of present illness.     All other systems reviewed and are negative.  EKGs/Labs/Other Studies Reviewed:    The following studies were reviewed today: Prior echocardiogram report from the [facility] on 04/14/2015 shows small apical muscular type ventricular septal defect with mild mitral valve prolapse with trivial regurgitation and no stenosis normal left ventricular size and systolic function without hypertrophy normal right ventricular size and qualitatively normal systolic function without hypertrophy and no right ventricular pressure elevation.  Her EF was 60%.  There was no evidence of coarctation.  EKG: EKG per HPI.  Recent Labs: 11/17/2017: TSH 1.340 12/17/2017: Hemoglobin 13.5; Platelets 300 12/18/2017: ALT 16; BUN 7; Creatinine, Ser 0.75; Potassium 5.1; Sodium 142  Recent Lipid Panel No results found for: CHOL, TRIG, HDL, CHOLHDL, VLDL, LDLCALC, LDLDIRECT  Physical Exam:    VS:  BP 94/60   Pulse 87   Ht 5' 2.75" (1.594 m)   Wt 146 lb (66.2 kg)   SpO2 97%   BMI 26.07 kg/m     Wt Readings from Last 3 Encounters:  12/31/17 146 lb (66.2 kg)  12/17/17 144 lb (65.3 kg)  11/27/17 144 lb (65.3 kg)     GEN:   Well nourished, well developed in no acute distress HEENT: Normal NECK: No JVD; No carotid bruits LYMPHATICS: No lymphadenopathy CARDIAC: RRR, no murmurs, rubs, gallops RESPIRATORY:  Clear to auscultation without rales, wheezing or rhonchi  ABDOMEN: Soft, non-tender, non-distended MUSCULOSKELETAL:  No edema; No deformity  SKIN: Warm and dry NEUROLOGIC:  Alert and oriented x 3 PSYCHIATRIC:  Normal affect   ASSESSMENT:    1. Palpitations   2. VSD (ventricular septal defect)    PLAN:    In order of problems listed above:  Palpitations -Classic description of premature ventricular contractions or premature atrial  contractions.  We discussed.  The fact that they are occurring mostly close to sleep when she is relaxing his classic as her heart rate slows, she no longer has a natural breaking system that keeps PVCs in check.  No high risk symptoms such as syncope or prolonged racing.  I agree with trying to limit or cut back on caffeine if possible but this is up to her.  Lithium level was low at 0.5.  Apical muscular type VSD -No evidence of right ventricular enlargement.  Prior echocardiogram 2016.  I will repeat to ensure that there is not been any changes.  Essential tremor -This is noted at baseline.  Hypotension -This is normal for her, stable.   Medication Adjustments/Labs and Tests Ordered: Current medicines are reviewed at length with the patient today.  Concerns regarding medicines are outlined above.  Orders Placed This Encounter  Procedures  . ECHOCARDIOGRAM COMPLETE   No orders of the defined types were placed in this encounter.   Patient Instructions  Medication Instructions:  The current medical regimen is effective;  continue present plan and medications.  Testing/Procedures: Your physician has requested that you have an echocardiogram. Echocardiography is a painless test that uses sound waves to create images of your heart. It provides your doctor with  information about the size and shape of your heart and how well your heart's chambers and valves are working. This procedure takes approximately one hour. There are no restrictions for this procedure.  Follow-Up: Follow as needed with Dr Anne Fu after the above testing.  Thank you for choosing [facility]!!        Signed, Donato Schultz, MD  12/31/2017 4:47 PM    [facility]

## 2017-12-31 NOTE — Patient Instructions (Signed)
Medication Instructions:  The current medical regimen is effective;  continue present plan and medications.  Testing/Procedures: Your physician has requested that you have an echocardiogram. Echocardiography is a painless test that uses sound waves to create images of your heart. It provides your doctor with information about the size and shape of your heart and how well your heart's chambers and valves are working. This procedure takes approximately one hour. There are no restrictions for this procedure.  Follow-Up: Follow as needed with Dr Anne Fu after the above testing.  Thank you for choosing [facility]!!

## 2018-01-05 ENCOUNTER — Other Ambulatory Visit (HOSPITAL_COMMUNITY): Payer: Self-pay

## 2018-01-05 DIAGNOSIS — Z79899 Other long term (current) drug therapy: Secondary | ICD-10-CM

## 2018-01-06 ENCOUNTER — Other Ambulatory Visit: Payer: Self-pay | Admitting: Gastroenterology

## 2018-01-06 LAB — COMPREHENSIVE METABOLIC PANEL
A/G RATIO: 3.5 — AB (ref 1.2–2.2)
ALK PHOS: 36 IU/L — AB (ref 39–117)
ALT: 15 IU/L (ref 0–32)
AST: 22 IU/L (ref 0–40)
Albumin: 4.6 g/dL (ref 3.5–5.5)
BILIRUBIN TOTAL: 0.3 mg/dL (ref 0.0–1.2)
BUN / CREAT RATIO: 10 (ref 9–23)
BUN: 7 mg/dL (ref 6–20)
CHLORIDE: 106 mmol/L (ref 96–106)
CO2: 22 mmol/L (ref 20–29)
Calcium: 9.4 mg/dL (ref 8.7–10.2)
Creatinine, Ser: 0.67 mg/dL (ref 0.57–1.00)
GFR calc non Af Amer: 124 mL/min/{1.73_m2} (ref >59)
GFR, EST AFRICAN AMERICAN: 143 mL/min/{1.73_m2} (ref >59)
Globulin, Total: 1.3 g/dL — ABNORMAL LOW (ref 1.5–4.5)
Glucose: 82 mg/dL (ref 65–99)
POTASSIUM: 4.8 mmol/L (ref 3.5–5.2)
Sodium: 141 mmol/L (ref 134–144)
TOTAL PROTEIN: 5.9 g/dL — AB (ref 6.0–8.5)

## 2018-01-06 LAB — LITHIUM LEVEL: LITHIUM LVL: 0.8 mmol/L (ref 0.6–1.2)

## 2018-01-06 MED ORDER — OMEPRAZOLE 20 MG PO CPDR: 20.0000 mg | DELAYED_RELEASE_CAPSULE | Freq: Two times a day (BID) | ORAL | 3 refills | Status: DC

## 2018-01-06 NOTE — Telephone Encounter (Signed)
Refill for BID dosing has been sent to the pharmacy. See 12-19-2017 for order for increase to BD dosing.

## 2018-01-06 NOTE — Telephone Encounter (Signed)
Pt requests prescription for prilosec 20mg  sent [facility] in [facility]. Pt stated that she takes 2 pills a day and pharmacy has instructions for 1 pill a day.

## 2018-01-13 ENCOUNTER — Ambulatory Visit (INDEPENDENT_AMBULATORY_CARE_PROVIDER_SITE_OTHER): Payer: Medicaid Other | Admitting: Clinical

## 2018-01-13 DIAGNOSIS — F840 Autistic disorder: Secondary | ICD-10-CM

## 2018-01-15 ENCOUNTER — Other Ambulatory Visit: Payer: Self-pay

## 2018-01-15 ENCOUNTER — Ambulatory Visit (INDEPENDENT_AMBULATORY_CARE_PROVIDER_SITE_OTHER): Payer: Medicaid Other | Admitting: Physician Assistant

## 2018-01-15 ENCOUNTER — Encounter (INDEPENDENT_AMBULATORY_CARE_PROVIDER_SITE_OTHER): Payer: Self-pay | Admitting: Physician Assistant

## 2018-01-15 VITALS — BP 105/69 | HR 76 | Temp 98.3°F | Ht 63.0 in | Wt 144.6 lb

## 2018-01-15 DIAGNOSIS — Z23 Encounter for immunization: Secondary | ICD-10-CM

## 2018-01-15 DIAGNOSIS — R002 Palpitations: Secondary | ICD-10-CM | POA: Diagnosis not present

## 2018-01-15 MED ORDER — PROMETHAZINE HCL 12.5 MG PO TABS: 12.5000 mg | ORAL_TABLET | Freq: Every day | ORAL | 1 refills | Status: DC

## 2018-01-15 NOTE — Patient Instructions (Signed)

## 2018-01-15 NOTE — Progress Notes (Signed)
Subjective:  Patient ID: Jill Alexander, female    DOB: January 20, 1996  Age: 22 y.o. MRN: [id number]  CC: f/u palpitations.  HPI  Erin Obando a 22 y.o.femalewith a medical history of anxiety, autism, depression, seizures, VSD, and essential tremor presents ot f/u on palpitations. Usually felt upon awakening in the morning or when she lays down to bed at night time. Sometimes notices the palpitations during the day if she is not "super distracted". Palpitations may last "maybe more than ten minutes, maybe an hour". Says she can hear her heart beating. She was referred to cardiology which described class PVCs vs PACs. Pt scheduled to have echocardiogram done. Does not endorse CP, SOB, HA, abdominal pain, f/c/n/v, swelling, or GI/GU sxs.     Outpatient Medications Prior to Visit  Medication Sig Dispense Refill  . acetaminophen (TYLENOL) 500 MG tablet Take 500 mg by mouth every 6 (six) hours as needed.    . ALPRAZolam (XANAX XR) 1 MG 24 hr tablet Take 1 tablet (1 mg total) by mouth 2 (two) times daily as needed for anxiety. 60 tablet 1  . Ibuprofen (MOTRIN PO) Take 1 tablet by mouth as needed.     . lamoTRIgine (LAMICTAL) 150 MG tablet Take 1 tablet (150 mg total) by mouth 2 (two) times daily. 60 tablet 4  . lithium carbonate (LITHOBID) 300 MG CR tablet Take 3 tablets (900 mg total) by mouth at bedtime. 90 tablet 2  . Multiple Vitamins-Minerals (MULTIVITAMIN PO) Take 1 tablet by mouth daily.    . Norethin Ace-Eth Estrad-FE (TAYTULLA) 1-20 MG-MCG(24) CAPS     . omeprazole (PRILOSEC) 20 MG capsule Take 1 capsule (20 mg total) by mouth 2 (two) times daily before a meal. 60 capsule 3  . polyethylene glycol (MIRALAX) packet Take 17 g by mouth daily as needed. 14 each 0  . ondansetron (ZOFRAN) 4 MG tablet Take 1 tablet (4 mg total) by mouth every 8 (eight) hours as needed for nausea. (Patient not taking: Reported on 01/15/2018) 30 tablet 1  . promethazine (PHENERGAN) 12.5 MG tablet Take  1 tablet (12.5 mg total) by mouth at bedtime. May also take 1 tablet every 8 hours as needed for nausea. (Patient not taking: Reported on 01/15/2018) 30 tablet 1  . Acetaminophen (MIDOL PO) Take 2 tablets by mouth as needed (during menstruation).      No facility-administered medications prior to visit.      ROS Review of Systems  Constitutional: Negative for chills, fever and malaise/fatigue.  Eyes: Negative for blurred vision.  Respiratory: Negative for shortness of breath.   Cardiovascular: Negative for chest pain and palpitations.  Gastrointestinal: Negative for abdominal pain and nausea.  Genitourinary: Negative for dysuria and hematuria.  Musculoskeletal: Negative for joint pain and myalgias.  Skin: Negative for rash.  Neurological: Negative for tingling and headaches.  Psychiatric/Behavioral: Negative for depression. The patient is not nervous/anxious.     Objective:  BP 105/69 (BP Location: Left Arm, Patient Position: Sitting, Cuff Size: Normal)   Pulse 76   Temp 98.3 F (36.8 C) (Oral)   Ht 5\' 3"  (1.6 m)   Wt 144 lb 9.6 oz (65.6 kg)   LMP 01/01/2018 (Approximate)   SpO2 99%   BMI 25.61 kg/m   BP/Weight 01/15/2018 12/31/2017 12/17/2017  Systolic BP 105 94 95  Diastolic BP 69 60 57  Wt. (Lbs) 144.6 146 144  BMI 25.61 26.07 25.51  Some encounter information is confidential and restricted. Go to Review Flowsheets activity  to see all data.      Physical Exam  Constitutional: She is oriented to person, place, and time.  Well developed, well nourished, NAD, polite  HENT:  Head: Normocephalic and atraumatic.  Eyes: No scleral icterus.  Neck: Normal range of motion. Neck supple. No thyromegaly present.  Cardiovascular: Normal rate, regular rhythm and normal heart sounds.  Pulmonary/Chest: Effort normal and breath sounds normal.  Musculoskeletal: She exhibits no edema.  Neurological: She is alert and oriented to person, place, and time.  Skin: Skin is warm and dry. No  rash noted. No erythema. No pallor.  Psychiatric: She has a normal mood and affect. Her behavior is normal. Thought content normal.  Vitals reviewed.    Assessment & Plan:    1. Palpitations - Continue cardiologist's recommendations in regards to caffeine consumption. Keep appointment for echocardiogram.   2. Need for influenza vaccination - Flu Vaccine QUAD 6+ mos PF IM (Fluarix Quad PF)   Meds ordered this encounter  Medications  . promethazine (PHENERGAN) 12.5 MG tablet    Sig: Take 1 tablet (12.5 mg total) by mouth at bedtime. May also take 1 tablet every 8 hours as needed for nausea.    Dispense:  30 tablet    Refill:  1    Order Specific Question:   Supervising Provider    Answer:   Hoy RegisterNEWLIN, ENOBONG [[id number]]    Follow-up: Return in about 1 year (around 01/16/2019) for annual physical .   Loletta Specteroger David Juliane Guest PA

## 2018-01-22 ENCOUNTER — Ambulatory Visit (INDEPENDENT_AMBULATORY_CARE_PROVIDER_SITE_OTHER): Payer: Medicaid Other | Admitting: Clinical

## 2018-01-22 DIAGNOSIS — F840 Autistic disorder: Secondary | ICD-10-CM

## 2018-01-27 ENCOUNTER — Ambulatory Visit (HOSPITAL_COMMUNITY): Payer: Medicaid Other

## 2018-01-27 ENCOUNTER — Encounter (HOSPITAL_COMMUNITY): Payer: Self-pay

## 2018-01-29 ENCOUNTER — Ambulatory Visit (INDEPENDENT_AMBULATORY_CARE_PROVIDER_SITE_OTHER): Payer: Medicaid Other | Admitting: Psychiatry

## 2018-01-29 ENCOUNTER — Other Ambulatory Visit: Payer: Self-pay

## 2018-01-29 ENCOUNTER — Ambulatory Visit (HOSPITAL_COMMUNITY): Payer: Medicaid Other | Attending: Cardiovascular Disease

## 2018-01-29 ENCOUNTER — Encounter (HOSPITAL_COMMUNITY): Payer: Self-pay | Admitting: Psychiatry

## 2018-01-29 DIAGNOSIS — F3181 Bipolar II disorder: Secondary | ICD-10-CM | POA: Diagnosis not present

## 2018-01-29 DIAGNOSIS — Q210 Ventricular septal defect: Secondary | ICD-10-CM | POA: Diagnosis present

## 2018-01-29 DIAGNOSIS — F0630 Mood disorder due to known physiological condition, unspecified: Secondary | ICD-10-CM | POA: Diagnosis not present

## 2018-01-29 MED ORDER — LITHIUM CARBONATE ER 300 MG PO TBCR: 900.0000 mg | EXTENDED_RELEASE_TABLET | Freq: Every day | ORAL | 1 refills | Status: DC

## 2018-01-29 MED ORDER — CARIPRAZINE HCL 4.5 MG PO CAPS: 4.5000 mg | ORAL_CAPSULE | Freq: Every day | ORAL | 1 refills | Status: DC

## 2018-01-29 MED ORDER — DIAZEPAM 2 MG PO TABS: 2.0000 mg | ORAL_TABLET | Freq: Two times a day (BID) | ORAL | 1 refills | Status: DC | PRN

## 2018-01-29 MED ORDER — LAMOTRIGINE 150 MG PO TABS: 150.0000 mg | ORAL_TABLET | Freq: Two times a day (BID) | ORAL | 1 refills | Status: DC

## 2018-01-29 NOTE — Progress Notes (Signed)
BH MD/PA/NP OP Progress Note  01/29/2018 2:26 PM Jill Alexander  MRN:  [id number]  Chief Complaint:  Chief Complaint    Depression     HPI: This is my first visit with Jill Alexander.  She was seeing Jill Alexander who has left [facility] and I will be taking over her management. Today pt reports she doesn't think the Xanax and Lithium are not working. She and her mom have noticed that she is very irritable. She has been having issues with labile mood- calm and baseline to feeling extremely depressed or agitated. Pt will feel like doing something violent or shouting. She is never physical but will yell. When depressed she will cry and feel sad. During this time she has low energy and low motivation and no desire to engage in personal hygiene.She sometimes has passive SI. Several days ago she had a brief, passing thought about lying in the road so a car would kill her. Pt denies any active plan or intent to kill herself.  Often it comes on suddenly and will last for several hours. Jill Alexander feels emotionally sensitive and often become sad when watching tv that are even a little emotional.  Pt is waking up multiple times during the night. She wakes up tired. Pt needs prompts from her mother to use her coping skills when pt is struggling.    Visit Diagnosis:    ICD-10-CM   1. Mood disorder in conditions classified elsewhere F06.30 diazepam (VALIUM) 2 MG tablet    Cariprazine HCl 4.5 MG CAPS    lamoTRIgine (LAMICTAL) 150 MG tablet    lithium carbonate (LITHOBID) 300 MG CR tablet  2. Bipolar 2 disorder (HCC) F31.81 Cariprazine HCl 4.5 MG CAPS    lamoTRIgine (LAMICTAL) 150 MG tablet    lithium carbonate (LITHOBID) 300 MG CR tablet      Past Psychiatric History: Patient denies any history of prior psychiatric hospitalizations.  She was treated with medications in the past-Lamictal, Abilify, Wellbutrin, Prozac, Ativan.  Prior to coming to [facility] outpatient patient was receiving treatment at [facility].  In [facility] pt worked with one psychiatrist for over 10 yrs. Pt denies any hx of previous suicide attempts.   Past Medical History:  Past Medical History:  Diagnosis Date  . Anxiety   . Autism   . Depression   . GERD (gastroesophageal reflux disease)   . Seizures (HCC)   . 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 NO PAST SURGERIES      Family Psychiatric  History:  Family History  Problem Relation Age of Onset  . Depression Father   . Anxiety disorder Sister   . Bipolar disorder Sister   . Diabetes Maternal Grandfather   . Cancer Maternal Grandfather   . Breast cancer Paternal Grandmother   . Uterine cancer Paternal Grandmother   . Irritable bowel syndrome Paternal Grandmother   . Colon cancer Neg Hx     Social History:  Social History   Socioeconomic History  . Marital status: Single    Spouse name: Not on file  . Number of children: Not on file  . Years of education: Not on file  . Highest education level: Not on file  Occupational History  . Not on file  Social Needs  . Financial resource strain: Not on file  . Food insecurity:    Worry: Not on file    Inability: Not on file  . Transportation needs:  Medical: Not on file    Non-medical: Not on file  Tobacco Use  . Smoking status: Never Smoker  . Smokeless tobacco: Never Used  Substance and Sexual Activity  . Alcohol use: No  . Drug use: No  . Sexual activity: Never  Lifestyle  . Physical activity:    Days per week: Not on file    Minutes per session: Not on file  . Stress: Not on file  Relationships  . Social connections:    Talks on phone: Not on file    Gets together: Not on file    Attends religious service: Not on file    Active member of club or organization: Not on file    Attends meetings of clubs or organizations: Not on file    Relationship status: Not on file  Other Topics Concern  . Not on file  Social History Narrative   Lives with  parents in [place]. Pt moved here from [place] in 2018.   Caffeine use: 2-3 cups per day   Left handed     Allergies: No Known Allergies  Metabolic Disorder Labs: No results found for: HGBA1C, MPG No results found for: PROLACTIN No results found for: CHOL, TRIG, HDL, CHOLHDL, VLDL, LDLCALC Lab Results  Component Value Date   TSH 1.340 11/17/2017   TSH 0.677 08/21/2017    Therapeutic Level Labs: Lab Results  Component Value Date   LITHIUM 0.8 01/05/2018   LITHIUM 0.5 (L) 12/18/2017   No results found for: VALPROATE No components found for:  CBMZ  Current Medications: Current Outpatient Medications  Medication Sig Dispense Refill  . acetaminophen (TYLENOL) 500 MG tablet Take 500 mg by mouth every 6 (six) hours as needed.    . Cariprazine HCl 4.5 MG CAPS Take 1 capsule (4.5 mg total) by mouth daily. 30 capsule 1  . Ibuprofen (MOTRIN PO) Take 1 tablet by mouth as needed.     . lamoTRIgine (LAMICTAL) 150 MG tablet Take 1 tablet (150 mg total) by mouth 2 (two) times daily. 60 tablet 1  . lithium carbonate (LITHOBID) 300 MG CR tablet Take 3 tablets (900 mg total) by mouth at bedtime. 90 tablet 1  . Multiple Vitamins-Minerals (MULTIVITAMIN PO) Take 1 tablet by mouth daily.    . Norethin Ace-Eth Estrad-FE (TAYTULLA) 1-20 MG-MCG(24) CAPS     . omeprazole (PRILOSEC) 20 MG capsule Take 1 capsule (20 mg total) by mouth 2 (two) times daily before a meal. 60 capsule 3  . promethazine (PHENERGAN) 12.5 MG tablet Take 1 tablet (12.5 mg total) by mouth at bedtime. May also take 1 tablet every 8 hours as needed for nausea. 30 tablet 1  . diazepam (VALIUM) 2 MG tablet Take 1 tablet (2 mg total) by mouth 2 (two) times daily as needed for anxiety. 60 tablet 1  . polyethylene glycol (MIRALAX) packet Take 17 g by mouth daily as needed. (Patient not taking: Reported on 01/29/2018) 14 each 0   No current Alexander-administered medications for this visit.      Musculoskeletal: Strength & Muscle  Tone: within normal limits Gait & Station: normal Patient leans: N/A  Psychiatric Specialty Exam: Review of Systems  Gastrointestinal: Negative for heartburn, nausea and vomiting.  Neurological: Positive for dizziness. Negative for sensory change, seizures and headaches.       Pt reports her last seizures was in 2015    Last menstrual period 01/01/2018.There is no height or weight on file to calculate BMI.  General Appearance: Fairly Groomed  Eye Contact:  Fair  Speech:  Normal Rate and without tones  Volume:  Normal  Mood:  Anxious and Dysphoric  Affect:  Blunt  Thought Process:  Coherent and Descriptions of Associations: Circumstantial  Orientation:  Full (Time, Place, and Person)  Thought Content: Logical   Suicidal Thoughts:  No  Homicidal Thoughts:  No  Memory:  Immediate;   Good  Judgement:  Fair  Insight:  Shallow  Psychomotor Activity:  Normal  Concentration:  Concentration: Fair  Recall:  Fair  Fund of Knowledge: Fair  Language: Poor  Akathisia:  No  Handed:  Left  AIMS (if indicated): not done  Assets:  Desire for Improvement Financial Resources/Insurance Housing Social Support  ADL's:  Intact  Cognition: WNL  Sleep:  Poor   Screenings: GAD-7     Office Visit from 01/15/2018 in [facility] Office Visit from 12/17/2017 in [facility] Office Visit from 08/21/2017 in [facility]  Total GAD-7 Score  15  14  18     PHQ2-9     Office Visit from 01/15/2018 in [facility] Office Visit from 12/17/2017 in [facility] Office Visit from 08/21/2017 in [facility]  PHQ-2 Total Score  5  5  6   PHQ-9 Total Score  20  17  18        Assessment and Plan: Mood disorder NOS versus bipolar 2 disorder-current episode depressed; seizure disorder; autism spectrum  I reviewed the records and discussed it with the patient   Medication management  with supportive therapy. Risks and benefits, side effects and alternative treatment options discussed with patient. Pt was given an opportunity to ask questions about medication, illness, and treatment. All current psychiatric medications have been reviewed and discussed with the patient and adjusted as clinically appropriate. The patient has been provided an accurate and updated list of the medications being now prescribed. Pt verbalized understanding and verbal consent obtained for treatment.  The risk of un-intended pregnancy is low based on the fact that pt reports she is using daily OCP. Pt is aware that these meds carry a teratogenic risk. Pt will discuss plan of action if she does or plans to become pregnant in the future.  Status of current problems: ongoing depression  Meds: Cariprazine 4.5 mg p.o. daily D/c Xanax ER  Lamictal 150 mg p.o. twice daily Lithium 900 mg p.o. Nightly Start trial of Valium 2mg  po BID prn anxiety/mood swings  Labs: Reviewed labs done 01/05/2018- CMP shows a decrease in total protein, lithium is 0.8.  Reviewed labs done 12/17/2017-at that point her lithium level was 0.3, CBC within normal limits, BMP within normal limits  Therapy: brief supportive therapy provided. Discussed psychosocial stressors in detail.  Encouraged pt to develop daily routine and work on daily goal setting as a way to improve mood symptoms.    Consultations: Encouraged to follow up with therapist-Dr. Jeanene Erb Encouraged to follow up with PCP as needed  Pt's acute risk factors for suicide are ongoing depression with passive SI. Pt's chronic risk factors are chronic mental illness. Pt's protective factors are social support. Pt denies SI and is at an acute low risk for suicide. Patient told to call clinic if any problems occur. Patient advised to go to ER if they should develop SI/HI, side effects, or if symptoms worsen. Pt has crisis numbers to call if needed. Pt acknowledged and agreed with  plan and verbalized  understanding.  F/up in 2 months or sooner if needed  The duration of this appointment visit was 40 minutes of face-to-face time with the patient.  Greater than 50% of this time was spent in counseling, explanation of  diagnosis, planning of further management, and coordination of care    Oletta Darter, MD 01/29/2018, 2:26 PM

## 2018-01-30 ENCOUNTER — Telehealth: Payer: Self-pay

## 2018-01-30 NOTE — Telephone Encounter (Signed)
-----   Message from Jake Bathe, MD sent at 01/30/2018  6:41 AM EDT -----  - Small apical VSD. Otherwise normal echo. Reassuring Donato Schultz, MD

## 2018-01-30 NOTE — Telephone Encounter (Signed)
Notes recorded by Sigurd Sos, RN on 01/30/2018 at 8:09 AM EDT Informed patient of Echo results. She verbalized understanding. ------

## 2018-02-02 ENCOUNTER — Telehealth: Payer: Self-pay

## 2018-02-02 NOTE — Telephone Encounter (Signed)
Patient is calling to let you know that the Valium is not working for her anxiety. Patient would like to know if she can go back on the Ativan, it was the only thing that worked. Please review and advise, thank you

## 2018-02-10 ENCOUNTER — Other Ambulatory Visit (HOSPITAL_COMMUNITY): Payer: Self-pay

## 2018-02-10 MED ORDER — LORAZEPAM 2 MG PO TABS: 2.0000 mg | ORAL_TABLET | Freq: Two times a day (BID) | ORAL | 1 refills | Status: DC

## 2018-02-17 ENCOUNTER — Ambulatory Visit (INDEPENDENT_AMBULATORY_CARE_PROVIDER_SITE_OTHER): Payer: Medicaid Other | Admitting: Clinical

## 2018-02-17 DIAGNOSIS — F840 Autistic disorder: Secondary | ICD-10-CM

## 2018-02-24 ENCOUNTER — Ambulatory Visit (INDEPENDENT_AMBULATORY_CARE_PROVIDER_SITE_OTHER): Payer: Medicaid Other | Admitting: Clinical

## 2018-02-24 DIAGNOSIS — F840 Autistic disorder: Secondary | ICD-10-CM

## 2018-02-25 ENCOUNTER — Other Ambulatory Visit: Payer: Self-pay

## 2018-02-25 MED ORDER — NORETHIN ACE-ETH ESTRAD-FE 1-20 MG-MCG(24) PO CAPS: 1.0000 | ORAL_CAPSULE | Freq: Every day | ORAL | 1 refills | Status: DC

## 2018-02-26 ENCOUNTER — Ambulatory Visit (INDEPENDENT_AMBULATORY_CARE_PROVIDER_SITE_OTHER): Payer: Self-pay | Admitting: Physician Assistant

## 2018-03-05 ENCOUNTER — Ambulatory Visit (INDEPENDENT_AMBULATORY_CARE_PROVIDER_SITE_OTHER): Payer: Medicaid Other | Admitting: Psychiatry

## 2018-03-05 ENCOUNTER — Encounter (HOSPITAL_COMMUNITY): Payer: Self-pay | Admitting: Psychiatry

## 2018-03-05 ENCOUNTER — Encounter (INDEPENDENT_AMBULATORY_CARE_PROVIDER_SITE_OTHER): Payer: Self-pay | Admitting: Physician Assistant

## 2018-03-05 ENCOUNTER — Ambulatory Visit (INDEPENDENT_AMBULATORY_CARE_PROVIDER_SITE_OTHER): Payer: Medicaid Other | Admitting: Clinical

## 2018-03-05 VITALS — BP 116/74 | HR 78 | Ht 63.0 in | Wt 144.0 lb

## 2018-03-05 DIAGNOSIS — F410 Panic disorder [episodic paroxysmal anxiety] without agoraphobia: Secondary | ICD-10-CM

## 2018-03-05 DIAGNOSIS — F0630 Mood disorder due to known physiological condition, unspecified: Secondary | ICD-10-CM | POA: Diagnosis not present

## 2018-03-05 DIAGNOSIS — F840 Autistic disorder: Secondary | ICD-10-CM | POA: Diagnosis not present

## 2018-03-05 DIAGNOSIS — F3181 Bipolar II disorder: Secondary | ICD-10-CM | POA: Diagnosis not present

## 2018-03-05 MED ORDER — LORAZEPAM 2 MG PO TABS: 2.0000 mg | ORAL_TABLET | Freq: Two times a day (BID) | ORAL | 1 refills | Status: DC

## 2018-03-05 MED ORDER — CARIPRAZINE HCL 4.5 MG PO CAPS: 4.5000 mg | ORAL_CAPSULE | Freq: Every day | ORAL | 1 refills | Status: DC

## 2018-03-05 MED ORDER — LAMOTRIGINE 150 MG PO TABS: 150.0000 mg | ORAL_TABLET | Freq: Two times a day (BID) | ORAL | 1 refills | Status: DC

## 2018-03-05 MED ORDER — ARIPIPRAZOLE 2 MG PO TABS: 2.0000 mg | ORAL_TABLET | Freq: Every day | ORAL | 1 refills | Status: DC

## 2018-03-05 NOTE — Progress Notes (Signed)
BH MD/PA/NP OP Progress Note  03/12/2018 4:38 PM Jill Alexander  MRN:  [id number]  Chief Complaint:  Chief Complaint    Depression     HPI: Today patient is accompanied by her mother.  Both mom and patient state that patient has been very irritable and snappy.  She appears very depressed with low motivation.  Her mood seems to go from depressed to irritable-up and down throughout the day. Mom feels like patient is doing much worse than she has been in a long while.  Mom believes this is due to the lithium.  They both agree that she did well when she was taking Abilify 5 mg but the dose was overly fatiguing.  Patient is endorsing on and off passive suicidal thoughts.  She denies any active plan or intent.  She remains very sensitive and reports little frustration tolerance.  Her anxiety remains high and she does have panic attacks that seem to improve a little with Ativan.   Visit Diagnosis:    ICD-10-CM   1. Panic attacks F41.0 LORazepam (ATIVAN) 2 MG tablet  2. Mood disorder in conditions classified elsewhere F06.30 lamoTRIgine (LAMICTAL) 150 MG tablet    Cariprazine HCl 4.5 MG CAPS    ARIPiprazole (ABILIFY) 2 MG tablet  3. Bipolar 2 disorder (HCC) F31.81 lamoTRIgine (LAMICTAL) 150 MG tablet    Cariprazine HCl 4.5 MG CAPS    ARIPiprazole (ABILIFY) 2 MG tablet      Past Psychiatric History: Patient denies any history of prior psychiatric hospitalizations.  She was treated with medications in the past-Lamictal, Abilify- flat and lethargic, Wellbutrin, Prozac, Ativan, Lithium.  Prior to coming to [facility] outpatient patient was receiving treatment at [facility]. In [place] pt worked with one psychiatrist for over 10 yrs. Pt denies any hx of previous suicide attempts.   Past Medical History:  Past Medical History:  Diagnosis Date  . Anxiety   . Autism   . Depression   . GERD (gastroesophageal reflux disease)   . Seizures (HCC)   . Tremor, 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    Past Surgical  History:  Procedure Laterality Date  . NO PAST SURGERIES      Family Psychiatric  History:  Family History  Problem Relation Age of Onset  . Depression Father   . Anxiety disorder Sister   . Bipolar disorder Sister   . Diabetes Maternal Grandfather   . Cancer Maternal Grandfather   . Breast cancer Paternal Grandmother   . Uterine cancer Paternal Grandmother   . Irritable bowel syndrome Paternal Grandmother   . Colon cancer Neg Hx     Social History:  Social History   Socioeconomic History  . Marital status: Single    Spouse name: Not on file  . Number of children: Not on file  . Years of education: Not on file  . Highest education level: Not on file  Occupational History  . Not on file  Social Needs  . Financial resource strain: Not on file  . Food insecurity:    Worry: Not on file    Inability: Not on file  . Transportation needs:    Medical: Not on file    Non-medical: Not on file  Tobacco Use  . Smoking status: Never Smoker  . Smokeless tobacco: Never Used  Substance and Sexual Activity  . Alcohol use: No  . Drug use: No  . Sexual activity: Never  Lifestyle  . Physical activity:    Days per week: Not on file  Minutes per session: Not on file  . Stress: Not on file  Relationships  . Social connections:    Talks on phone: Not on file    Gets together: Not on file    Attends religious service: Not on file    Active member of club or organization: Not on file    Attends meetings of clubs or organizations: Not on file    Relationship status: Not on file  Other Topics Concern  . Not on file  Social History Narrative   Lives with parents in [place]. Pt moved here from [place] in 2018.   Caffeine use: 2-3 cups per day   Left handed     Allergies: No Known Allergies  Metabolic Disorder Labs: No results found for: HGBA1C, MPG No results found for: PROLACTIN No results found for: CHOL, TRIG, HDL, CHOLHDL, VLDL, LDLCALC Lab Results  Component  Value Date   TSH 1.340 11/17/2017   TSH 0.677 08/21/2017    Therapeutic Level Labs: Lab Results  Component Value Date   LITHIUM 0.8 01/05/2018   LITHIUM 0.5 (L) 12/18/2017   No results found for: VALPROATE No components found for:  CBMZ  Current Medications: Current Outpatient Medications  Medication Sig Dispense Refill  . acetaminophen (TYLENOL) 500 MG tablet Take 500 mg by mouth every 6 (six) hours as needed.    . Cariprazine HCl 4.5 MG CAPS Take 1 capsule (4.5 mg total) by mouth daily. 30 capsule 1  . Ibuprofen (MOTRIN PO) Take 1 tablet by mouth as needed.     . lamoTRIgine (LAMICTAL) 150 MG tablet Take 1 tablet (150 mg total) by mouth 2 (two) times daily. 60 tablet 1  . LORazepam (ATIVAN) 2 MG tablet Take 1 tablet (2 mg total) by mouth 2 (two) times daily. 60 tablet 1  . Multiple Vitamins-Minerals (MULTIVITAMIN PO) Take 1 tablet by mouth daily.    . Norethin Ace-Eth Estrad-FE (TAYTULLA) 1-20 MG-MCG(24) CAPS Take 1 tablet by mouth daily. 28 capsule 1  . omeprazole (PRILOSEC) 20 MG capsule Take 1 capsule (20 mg total) by mouth 2 (two) times daily before a meal. 60 capsule 3  . polyethylene glycol (MIRALAX) packet Take 17 g by mouth daily as needed. 14 each 0  . promethazine (PHENERGAN) 12.5 MG tablet Take 1 tablet (12.5 mg total) by mouth at bedtime. May also take 1 tablet every 8 hours as needed for nausea. 30 tablet 1  . ARIPiprazole (ABILIFY) 2 MG tablet Take 1 tablet (2 mg total) by mouth daily. 30 tablet 1   No current facility-administered medications for this visit.      Musculoskeletal: Strength & Muscle Tone: within normal limits Gait & Station: normal Patient leans: N/A  Psychiatric Specialty Exam: Review of Systems  Constitutional: Negative for chills, diaphoresis and fever.  Psychiatric/Behavioral: Positive for depression and suicidal ideas. Negative for substance abuse. The patient is nervous/anxious.     Blood pressure 116/74, pulse 78, height 5\' 3"  (1.6 m),  weight 144 lb (65.3 kg), SpO2 98 %.Body mass index is 25.51 kg/m.  General Appearance: Casual and Fairly Groomed  Eye Contact:  Fair  Speech:  Normal Rate and lack of inflections   Volume:  Normal  Mood:  Anxious and Depressed  Affect:  Blunt  Thought Process:  Coherent and Descriptions of Associations: Circumstantial  Orientation:  Full (Time, Place, and Person)  Thought Content:  Logical  Suicidal Thoughts:  No  Homicidal Thoughts:  No  Memory:  Immediate;   Good  Judgement:  Fair  Insight:  Fair  Psychomotor Activity:  Normal  Concentration:  Concentration: Good  Recall:  Good  Fund of Knowledge:  Good  Language:  Good  Akathisia:  No  Handed:  Right  AIMS (if indicated):     Assets:  Desire for Improvement Housing Social Support Transportation  ADL's:  Intact  Cognition:  WNL  Sleep:   poor     Screenings: GAD-7     Office Visit from 01/15/2018 in [facility] Office Visit from 12/17/2017 in [facility] Office Visit from 08/21/2017 in [facility]  Total GAD-7 Score  15  14  18     PHQ2-9     Office Visit from 01/15/2018 in [facility] Office Visit from 12/17/2017 in [facility] Office Visit from 08/21/2017 in [facility]  PHQ-2 Total Score  5  5  6   PHQ-9 Total Score  20  17  18       I reviewed the information below on 03/05/2018 and have updated it Assessment and Plan: Mood disorder NOS versus bipolar 2 disorder-current episode depressed; seizure disorder; autism spectrum    Medication management with supportive therapy. Risks and benefits, side effects and alternative treatment options discussed with patient. Pt was given an opportunity to ask questions about medication, illness, and treatment. All current psychiatric medications have been reviewed and discussed with the patient and adjusted as clinically appropriate. The patient has  been provided an accurate and updated list of the medications being now prescribed. Pt verbalized understanding and verbal consent obtained for treatment.  The risk of un-intended pregnancy is low based on the fact that pt reports she is using daily OCP. Pt is aware that these meds carry a teratogenic risk. Pt will discuss plan of action if she does or plans to become pregnant in the future.  Status of current problems: ongoing depression  Meds: Cariprazine 4.5 mg p.o. daily Lamictal 150 mg p.o. twice daily D/c Lithium  Ativan 2mg  po BID prn anxiety/mood swings Start trial of Abilify 2mg  po qHS for mood-and mom both report improvement with previous use.  It was stopped due to excessive fatigue.  Labs: Reviewed labs done 01/05/2018- CMP shows a decrease in total protein, lithium is 0.8.  Reviewed labs done 12/17/2017-at that point her lithium level was 0.3, CBC within normal limits, BMP within normal limits  Therapy: brief supportive therapy provided. Discussed psychosocial stressors in detail.    Consultations: Encouraged to follow up with therapist-Dr. Jeanene Erb Starting DBT thru [facility] next week (referred by therapist) Encouraged to follow up with PCP as needed Referred for IOP  Pt's acute risk factors for suicide are ongoing depression with passive SI. Pt's chronic risk factors are chronic mental illness. Pt's protective factors are social support. Pt denies SI and is at an acute low risk for suicide. Patient told to call clinic if any problems occur. Patient advised to go to ER if they should develop SI/HI, side effects, or if symptoms worsen. Pt has crisis numbers to call if needed. Pt acknowledged and agreed with plan and verbalized understanding.  F/up in 2 months or sooner if needed  The duration of this appointment visit was 25 minutes of face-to-face time with the patient.  Greater than 50% of this time was spent in counseling, explanation of  diagnosis, planning of further  management, and coordination of care    [facility],  MD 03/12/2018, 4:38 PM

## 2018-03-10 ENCOUNTER — Ambulatory Visit (INDEPENDENT_AMBULATORY_CARE_PROVIDER_SITE_OTHER): Payer: Medicaid Other | Admitting: Clinical

## 2018-03-10 ENCOUNTER — Encounter (INDEPENDENT_AMBULATORY_CARE_PROVIDER_SITE_OTHER): Payer: Self-pay | Admitting: Physician Assistant

## 2018-03-10 DIAGNOSIS — F840 Autistic disorder: Secondary | ICD-10-CM | POA: Diagnosis not present

## 2018-03-12 ENCOUNTER — Ambulatory Visit (HOSPITAL_COMMUNITY)
Admission: EM | Admit: 2018-03-12 | Discharge: 2018-03-12 | Disposition: A | Discharge status: Home / Self Care | Payer: Medicaid Other

## 2018-03-12 NOTE — ED Notes (Signed)
Per pt access, pt lwbs, stating she felt better

## 2018-03-13 ENCOUNTER — Ambulatory Visit (HOSPITAL_COMMUNITY)
Admission: EM | Admit: 2018-03-13 | Discharge: 2018-03-13 | Disposition: A | Discharge status: Home / Self Care | Payer: Medicaid Other | Attending: Internal Medicine | Admitting: Internal Medicine

## 2018-03-13 ENCOUNTER — Encounter (HOSPITAL_COMMUNITY): Payer: Self-pay | Admitting: Emergency Medicine

## 2018-03-13 DIAGNOSIS — R300 Dysuria: Secondary | ICD-10-CM

## 2018-03-13 DIAGNOSIS — K219 Gastro-esophageal reflux disease without esophagitis: Secondary | ICD-10-CM | POA: Insufficient documentation

## 2018-03-13 DIAGNOSIS — R569 Unspecified convulsions: Secondary | ICD-10-CM | POA: Diagnosis not present

## 2018-03-13 DIAGNOSIS — G250 Essential tremor: Secondary | ICD-10-CM | POA: Diagnosis not present

## 2018-03-13 DIAGNOSIS — F840 Autistic disorder: Secondary | ICD-10-CM | POA: Diagnosis not present

## 2018-03-13 DIAGNOSIS — F329 Major depressive disorder, single episode, unspecified: Secondary | ICD-10-CM | POA: Insufficient documentation

## 2018-03-13 DIAGNOSIS — F419 Anxiety disorder, unspecified: Secondary | ICD-10-CM | POA: Diagnosis not present

## 2018-03-13 DIAGNOSIS — R350 Frequency of micturition: Secondary | ICD-10-CM

## 2018-03-13 DIAGNOSIS — Z79899 Other long term (current) drug therapy: Secondary | ICD-10-CM | POA: Insufficient documentation

## 2018-03-13 LAB — POCT URINALYSIS DIP (DEVICE)
BILIRUBIN URINE: NEGATIVE
GLUCOSE, UA: NEGATIVE mg/dL
Hgb urine dipstick: NEGATIVE
KETONES UR: NEGATIVE mg/dL
Leukocytes, UA: NEGATIVE
NITRITE: NEGATIVE
PROTEIN: NEGATIVE mg/dL
Specific Gravity, Urine: 1.02 (ref 1.005–1.030)
Urobilinogen, UA: 0.2 mg/dL (ref 0.0–1.0)
pH: 8.5 — ABNORMAL HIGH (ref 5.0–8.0)

## 2018-03-13 LAB — POCT PREGNANCY, URINE: PREG TEST UR: NEGATIVE

## 2018-03-13 MED ORDER — CEPHALEXIN 500 MG PO CAPS: 500.0000 mg | ORAL_CAPSULE | Freq: Two times a day (BID) | ORAL | 0 refills | Status: AC

## 2018-03-13 MED ORDER — PHENAZOPYRIDINE HCL 200 MG PO TABS: 200.0000 mg | ORAL_TABLET | Freq: Three times a day (TID) | ORAL | 0 refills | Status: DC

## 2018-03-13 NOTE — ED Triage Notes (Signed)
Pt reports urinary frequency, urgency, and burning that started today.

## 2018-03-13 NOTE — Discharge Instructions (Addendum)
Urine test at the urgent care today did not suggest a UTI, but is occasionally falsely negative.  A urine culture is pending; the urgent care will contact you if a change in treatment is needed.  Prescriptions for keflex (antibiotic) and pyridium (for urinary discomfort) were sent to the pharmacy.  Other possible causes of urinary symptoms include:  chafing; irritation from hygiene product; other pelvic infection (yeast, bacterial vaginosis) or STD; dietary cause (caffeine); bowel issue (constipation); low estrogen effect; kidney stone passage; or interstitial cystitis.  Recheck or followup with your primary care provider for further evaluation if symptoms are not improving.

## 2018-03-14 LAB — URINE CULTURE

## 2018-03-15 NOTE — ED Provider Notes (Signed)
[facility]    CSN: [id number] Arrival date & time: 03/13/18  1049     History   Chief Complaint Chief Complaint  Patient presents with  . Urinary Tract Infection    HPI Jill Alexander is a 22 y.o. female. Presents today with dysuria, frequency/urgency of urination, started in the last 24 hours.  No pelvic pain.  No fever, no malaise.  No unusual vag discharge/bleeding.  No change in BMs.  No N/V.  Hx prior UTIs. Denies risk of STIs; no new hygiene products, no change in caffeine use.    HPI  Past Medical History:  Diagnosis Date  . Anxiety   . Autism   . Depression   . GERD (gastroesophageal reflux disease)   . Seizures (HCC)   . 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 Seizures (HCC) 04/09/2017  . 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 NO PAST SURGERIES       Home Medications    Prior to Admission medications   Medication Sig Start Date End Date Taking? Authorizing Provider  ARIPiprazole (ABILIFY) 2 MG tablet Take 1 tablet (2 mg total) by mouth daily. 03/05/18  Yes Oletta DarterAgarwal, [place], MD  Cariprazine HCl 4.5 MG CAPS Take 1 capsule (4.5 mg total) by mouth daily. 03/05/18  Yes Oletta DarterAgarwal, [place], MD  lamoTRIgine (LAMICTAL) 150 MG tablet Take 1 tablet (150 mg total) by mouth 2 (two) times daily. 03/05/18  Yes Oletta DarterAgarwal, [place], MD  LORazepam (ATIVAN) 2 MG tablet Take 1 tablet (2 mg total) by mouth 2 (two) times daily. 03/05/18 03/05/19 Yes Oletta DarterAgarwal, [place], MD  Multiple Vitamins-Minerals (MULTIVITAMIN PO) Take 1 tablet by mouth daily.   Yes [provider]  Norethin Ace-Eth Estrad-FE (TAYTULLA) 1-20 MG-MCG(24) CAPS Take 1 tablet by mouth daily. 02/25/18  Yes Dove, Myra C, MD  omeprazole (PRILOSEC) 20 MG capsule Take 1 capsule (20 mg total) by mouth 2 (two) times daily before a meal. 01/06/18  Yes  Armbruster, Willaim RayasSteven P, MD  polyethylene glycol (MIRALAX) packet Take 17 g by mouth daily as needed. 11/19/17  Yes Armbruster, Willaim RayasSteven P, MD  acetaminophen (TYLENOL) 500 MG tablet Take 500 mg by mouth every 6 (six) hours as needed.    [provider]  cephALEXin (KEFLEX) 500 MG capsule Take 1 capsule (500 mg total) by mouth 2 (two) times daily for 5 days. 03/13/18 03/18/18  Isa RankinMurray, Jalon Squier Wilson, MD  Ibuprofen (MOTRIN PO) Take 1 tablet by mouth as needed.     [provider]  phenazopyridine (PYRIDIUM) 200 MG tablet Take 1 tablet (200 mg total) by mouth 3 (three) times daily. 03/13/18   Isa RankinMurray, Nyasiah Moffet Wilson, MD  promethazine (PHENERGAN) 12.5 MG tablet Take 1 tablet (12.5 mg total) by mouth at bedtime. May also take 1 tablet every 8 hours as needed for nausea. 01/15/18   Loletta SpecterGomez, Roger David, PA-C    Family History Family History  Problem Relation Age of Onset  . Depression Father   . Anxiety disorder Sister   . Bipolar disorder Sister   . Diabetes Maternal Grandfather   . Cancer Maternal Grandfather   . Breast cancer Paternal Grandmother   . Uterine cancer Paternal Grandmother   . Irritable bowel syndrome Paternal Grandmother   . Colon cancer Neg Hx     Social History Social History   Tobacco Use  .  Smoking status: Never Smoker  . Smokeless tobacco: Never Used  Substance Use Topics  . Alcohol use: No  . Drug use: No     Allergies   Patient has no known allergies.   Review of Systems Review of Systems  All other systems reviewed and are negative.    Physical Exam Triage Vital Signs ED Triage Vitals [03/13/18 1145]  Enc Vitals Group     BP 116/70     Pulse Rate 73     Resp      Temp 98.6 F (37 C)     Temp Source Oral     SpO2 100 %     Weight      Height      Pain Score 3     Pain Loc    Updated Vital Signs BP 116/70 (BP Location: Right Arm)   Pulse 73   Temp 98.6 F (37 C) (Oral)   SpO2 100%  Physical Exam  Constitutional: She is oriented  to person, place, and time. No distress.  HENT:  Head: Atraumatic.  Eyes:  Conjugate gaze observed, no eye redness/discharge  Neck: Neck supple.  Cardiovascular: Normal rate.  Pulmonary/Chest: No respiratory distress.  Abdominal: She exhibits no distension.  Musculoskeletal: Normal range of motion.  Neurological: She is alert and oriented to person, place, and time.  Skin: Skin is warm and dry.  Nursing note and vitals reviewed.    UC Treatments / Results  Labs Results for orders placed or performed during the hospital encounter of 03/13/18  Urine culture  Result Value Ref Range   Specimen Description URINE, RANDOM    Special Requests      NONE Performed at [facility], [address] [place], [place] [address]    Culture      Multiple bacterial morphotypes present, none predominant. Suggest appropriate recollection if clinically indicated.   Report Status 03/14/2018 FINAL   POCT urinalysis dip (device)  Result Value Ref Range   Glucose, UA NEGATIVE NEGATIVE mg/dL   Bilirubin Urine NEGATIVE NEGATIVE   Ketones, ur NEGATIVE NEGATIVE mg/dL   Specific Gravity, Urine 1.020 1.005 - 1.030   Hgb urine dipstick NEGATIVE NEGATIVE   pH 8.5 (H) 5.0 - 8.0   Protein, ur NEGATIVE NEGATIVE mg/dL   Urobilinogen, UA 0.2 0.0 - 1.0 mg/dL   Nitrite NEGATIVE NEGATIVE   Leukocytes, UA NEGATIVE NEGATIVE  Pregnancy, urine POC  Result Value Ref Range   Preg Test, Ur NEGATIVE NEGATIVE     Final Clinical Impressions(s) / UC Diagnoses   Final diagnoses:  Dysuria  Urinary frequency     Discharge Instructions     Urine test at the urgent care today did not suggest a UTI, but is occasionally falsely negative.  A urine culture is pending; the urgent care will contact you if a change in treatment is needed.  Prescriptions for keflex (antibiotic) and pyridium (for urinary discomfort) were sent to the pharmacy.  Other possible causes of urinary symptoms include:  chafing; irritation  from hygiene product; other pelvic infection (yeast, bacterial vaginosis) or STD; dietary cause (caffeine); bowel issue (constipation); low estrogen effect; kidney stone passage; or interstitial cystitis.  Recheck or followup with your primary care provider for further evaluation if symptoms are not improving.      ED Prescriptions    Medication Sig Dispense Auth. Provider   cephALEXin (KEFLEX) 500 MG capsule Take 1 capsule (500 mg total) by mouth 2 (two) times daily for 5 days.  10 capsule Isa Rankin, MD   phenazopyridine (PYRIDIUM) 200 MG tablet Take 1 tablet (200 mg total) by mouth 3 (three) times daily. 6 tablet Isa Rankin, MD        Isa Rankin, MD 03/15/18 [phone]

## 2018-03-19 ENCOUNTER — Ambulatory Visit (INDEPENDENT_AMBULATORY_CARE_PROVIDER_SITE_OTHER): Payer: Medicaid Other | Admitting: Clinical

## 2018-03-19 DIAGNOSIS — F840 Autistic disorder: Secondary | ICD-10-CM

## 2018-03-24 ENCOUNTER — Telehealth: Payer: Self-pay

## 2018-03-24 DIAGNOSIS — N926 Irregular menstruation, unspecified: Secondary | ICD-10-CM

## 2018-03-24 MED ORDER — NORETHIN ACE-ETH ESTRAD-FE 1-20 MG-MCG(24) PO CAPS: 1.0000 | ORAL_CAPSULE | Freq: Every day | ORAL | 1 refills | Status: DC

## 2018-03-24 MED ORDER — NORETHIN ACE-ETH ESTRAD-FE 1-20 MG-MCG(24) PO CAPS: 1.0000 | ORAL_CAPSULE | Freq: Every day | ORAL | 12 refills | Status: DC

## 2018-03-24 NOTE — Telephone Encounter (Signed)
Pt left message to have birth control pills refilled sent in refill for pt and verified pts pharmacy on file.

## 2018-03-24 NOTE — Addendum Note (Signed)
Addended by: Dorathy KinsmanSMITH, Maryem Shuffler on: 03/24/2018 05:26 PM   Modules accepted: Orders

## 2018-03-31 ENCOUNTER — Other Ambulatory Visit: Payer: Self-pay

## 2018-03-31 ENCOUNTER — Ambulatory Visit (HOSPITAL_COMMUNITY)
Admission: EM | Admit: 2018-03-31 | Discharge: 2018-03-31 | Disposition: A | Discharge status: Home / Self Care | Payer: Medicaid Other | Attending: Internal Medicine | Admitting: Internal Medicine

## 2018-03-31 ENCOUNTER — Telehealth (HOSPITAL_COMMUNITY): Payer: Self-pay | Admitting: Professional

## 2018-03-31 ENCOUNTER — Encounter (HOSPITAL_COMMUNITY): Payer: Self-pay | Admitting: Emergency Medicine

## 2018-03-31 DIAGNOSIS — R110 Nausea: Secondary | ICD-10-CM | POA: Diagnosis not present

## 2018-03-31 DIAGNOSIS — K5901 Slow transit constipation: Secondary | ICD-10-CM

## 2018-03-31 LAB — POCT URINALYSIS DIP (DEVICE)
Bilirubin Urine: NEGATIVE
GLUCOSE, UA: NEGATIVE mg/dL
HGB URINE DIPSTICK: NEGATIVE
Ketones, ur: NEGATIVE mg/dL
NITRITE: NEGATIVE
Protein, ur: NEGATIVE mg/dL
Specific Gravity, Urine: 1.02 (ref 1.005–1.030)
UROBILINOGEN UA: 0.2 mg/dL (ref 0.0–1.0)
pH: 7 (ref 5.0–8.0)

## 2018-03-31 LAB — POCT PREGNANCY, URINE: Preg Test, Ur: NEGATIVE

## 2018-03-31 MED ORDER — METOCLOPRAMIDE HCL 10 MG PO TABS: 10.0000 mg | ORAL_TABLET | Freq: Four times a day (QID) | ORAL | 0 refills | Status: DC

## 2018-03-31 NOTE — Telephone Encounter (Signed)
Cln returned pt call. Cln apologized for the lapse in time due to the holiday break. Pt reports she would like to schedule an ax in Jan for PHP. Pt was referred by Dr. Rene KocherEksir earlier this year but could not attend due to having medicaid. Pt scheduled for 05/04/18 @ 2:30. Pt is scheduled to see Dr. Kenna GilbertArgarwal this Thursday, 12/5. Cln told pt the PW will be left at the front desk for pt to complete prior to the appt so she does not have to arrive so early. Pt agrees. Pt told to arrive at 2:15 with completed paperwork and insurance card for a 2:30 appt. Pt agrees and understands. Pt denies any danger to self or others at this time.

## 2018-03-31 NOTE — ED Provider Notes (Signed)
[facility]    CSN: [id number] Arrival date & time: 03/31/18  1019     History   Chief Complaint Chief Complaint  Patient presents with  . Nausea    HPI Jill Alexander is a 22 y.o. female.   22 year old female with history of autism and seizure disorder presents to urgent care complaining of nausea.  The patient denies vomiting.  The nausea can occur with or without food.  The patient reports there is no possibility that she could be pregnant.  She states the nausea is not situational and is not related to her tremor.  She denies fever or diarrhea.  The patient reports that she usually has problems with constipation but is on a stool softener.     Past Medical History:  Diagnosis Date  . Anxiety   . Autism   . Depression   . GERD (gastroesophageal reflux disease)   . Seizures (HCC)   . 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 Seizures (HCC) 04/09/2017  . 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 NO PAST SURGERIES      OB History   None      Home Medications    Prior to Admission medications   Medication Sig Start Date End Date Taking? Authorizing Provider  acetaminophen (TYLENOL) 500 MG tablet Take 500 mg by mouth every 6 (six) hours as needed.    [provider]  ARIPiprazole (ABILIFY) 2 MG tablet Take 1 tablet (2 mg total) by mouth daily. 03/05/18   Oletta Darter, MD  Cariprazine HCl 4.5 MG CAPS Take 1 capsule (4.5 mg total) by mouth daily. 03/05/18   Oletta Darter, MD  Ibuprofen (MOTRIN PO) Take 1 tablet by mouth as needed.     [provider]  lamoTRIgine (LAMICTAL) 150 MG tablet Take 1 tablet (150 mg total) by mouth 2 (two) times daily. 03/05/18   Oletta Darter, MD  LORazepam (ATIVAN) 2 MG tablet Take 1 tablet (2 mg total) by mouth 2 (two) times daily. 03/05/18  03/05/19  Oletta Darter, MD  metoCLOPramide (REGLAN) 10 MG tablet Take 1 tablet (10 mg total) by mouth every 6 (six) hours. 03/31/18   Arnaldo Natal, MD  Multiple Vitamins-Minerals (MULTIVITAMIN PO) Take 1 tablet by mouth daily.    [provider]  Norethin Ace-Eth Estrad-FE (TAYTULLA) 1-20 MG-MCG(24) CAPS Take 1 tablet by mouth daily. 03/24/18   Katrinka Blazing, [place], CNM  omeprazole (PRILOSEC) 20 MG capsule Take 1 capsule (20 mg total) by mouth 2 (two) times daily before a meal. 01/06/18   Armbruster, Willaim Rayas, MD  phenazopyridine (PYRIDIUM) 200 MG tablet Take 1 tablet (200 mg total) by mouth 3 (three) times daily. 03/13/18   Isa Rankin, MD  polyethylene glycol [facility]) packet Take 17 g by mouth daily as needed. 11/19/17   Armbruster, Willaim Rayas, MD  promethazine (PHENERGAN) 12.5 MG tablet Take 1 tablet (12.5 mg total) by mouth at bedtime. May also take 1 tablet every 8 hours as needed for nausea. 01/15/18   Loletta Specter, PA-C    Family History Family History  Problem Relation Age of Onset  . Depression Father   . Anxiety disorder Sister   . Bipolar disorder Sister   . Diabetes Maternal Grandfather   . Cancer Maternal Grandfather   . Breast cancer Paternal Grandmother   .  Uterine cancer Paternal Grandmother   . Irritable bowel syndrome Paternal Grandmother   . Colon cancer Neg Hx     Social History Social History   Tobacco Use  . Smoking status: Never Smoker  . Smokeless tobacco: Never Used  Substance Use Topics  . Alcohol use: No  . Drug use: No     Allergies   Patient has no known allergies.   Review of Systems Review of Systems  Constitutional: Negative for chills and fever.  HENT: Negative for sore throat and tinnitus.   Eyes: Negative for redness.  Respiratory: Negative for cough and shortness of breath.   Cardiovascular: Negative for chest pain and palpitations.  Gastrointestinal: Positive for constipation and nausea. Negative for abdominal  pain, diarrhea and vomiting.  Genitourinary: Negative for dysuria, frequency and urgency.  Musculoskeletal: Negative for myalgias.  Skin: Negative for rash.       No lesions  Neurological: Negative for weakness.  Hematological: Does not bruise/bleed easily.  Psychiatric/Behavioral: Negative for suicidal ideas.     Physical Exam Triage Vital Signs ED Triage Vitals  Enc Vitals Group     BP 03/31/18 1110 109/67     Pulse Rate 03/31/18 1110 70     Resp 03/31/18 1110 16     Temp 03/31/18 1110 98.6 F (37 C)     Temp Source 03/31/18 1110 Oral     SpO2 03/31/18 1110 100 %     Weight --      Height --      Head Circumference --      Peak Flow --      Pain Score 03/31/18 1109 0     Pain Loc --      Pain Edu? --      Excl. in GC? --    No data found.  Updated Vital Signs BP 109/67   Pulse 70   Temp 98.6 F (37 C) (Oral)   Resp 16   SpO2 100%   Visual Acuity Right Eye Distance:   Left Eye Distance:   Bilateral Distance:    Right Eye Near:   Left Eye Near:    Bilateral Near:     Physical Exam  Constitutional: She is oriented to person, place, and time. She appears well-developed and well-nourished. No distress.  HENT:  Head: Normocephalic and atraumatic.  Mouth/Throat: Oropharynx is clear and moist.  Eyes: Pupils are equal, round, and reactive to light. Conjunctivae and EOM are normal. No scleral icterus.  Neck: Normal range of motion. Neck supple. No JVD present. No tracheal deviation present. No thyromegaly present.  Cardiovascular: Normal rate, regular rhythm and normal heart sounds. Exam reveals no gallop and no friction rub.  No murmur heard. Pulmonary/Chest: Effort normal and breath sounds normal.  Abdominal: Soft. Bowel sounds are normal. She exhibits no distension. There is no tenderness.  Musculoskeletal: Normal range of motion. She exhibits no edema.  Lymphadenopathy:    She has no cervical adenopathy.  Neurological: She is alert and oriented to person,  place, and time. No cranial nerve deficit.  Skin: Skin is warm and dry.  Psychiatric: She has a normal mood and affect. Her behavior is normal. Judgment and thought content normal.  Nursing note and vitals reviewed.    UC Treatments / Results  Labs (all labs ordered are listed, but only abnormal results are displayed) Labs Reviewed  POCT URINALYSIS DIP (DEVICE) - Abnormal; Notable for the following components:      Result Value   Leukocytes,  UA TRACE (*)    All other components within normal limits  POCT PREGNANCY, URINE    EKG None  Radiology No results found.  Procedures Procedures (including critical care time)  Medications Ordered in UC Medications - No data to display  Initial Impression / Assessment and Plan / UC Course  I have reviewed the triage vital signs and the nursing notes.  Pertinent labs & imaging results that were available during my care of the patient were reviewed by me and considered in my medical decision making (see chart for details).     Etiology of nausea could be related to anxiety or to constipation.  The patient's last bowel movement was yesterday and was large but nonpainful.  Prescribed Reglan to improve transit.  Recommended taking with Benadryl at night.  Final Clinical Impressions(s) / UC Diagnoses   Final diagnoses:  Nausea without vomiting  Slow transit constipation   Discharge Instructions   None    ED Prescriptions    Medication Sig Dispense Auth. Provider   metoCLOPramide (REGLAN) 10 MG tablet Take 1 tablet (10 mg total) by mouth every 6 (six) hours. 30 tablet Arnaldo Natal, MD     Controlled Substance Prescriptions [place] Controlled Substance Registry consulted? Not Applicable   Arnaldo Natal, MD 03/31/18 1432

## 2018-03-31 NOTE — ED Triage Notes (Addendum)
PT reports intermittent nausea for 3-4 days. No vomiting. Pt has been using zofran with no relief.

## 2018-04-02 ENCOUNTER — Encounter (HOSPITAL_COMMUNITY): Payer: Self-pay | Admitting: Psychiatry

## 2018-04-02 ENCOUNTER — Ambulatory Visit (INDEPENDENT_AMBULATORY_CARE_PROVIDER_SITE_OTHER): Payer: Medicaid Other | Admitting: Psychiatry

## 2018-04-02 VITALS — BP 133/72 | HR 110 | Ht 63.0 in | Wt 140.6 lb

## 2018-04-02 DIAGNOSIS — F410 Panic disorder [episodic paroxysmal anxiety] without agoraphobia: Secondary | ICD-10-CM

## 2018-04-02 DIAGNOSIS — F0630 Mood disorder due to known physiological condition, unspecified: Secondary | ICD-10-CM | POA: Diagnosis not present

## 2018-04-02 DIAGNOSIS — F3181 Bipolar II disorder: Secondary | ICD-10-CM | POA: Diagnosis not present

## 2018-04-02 MED ORDER — LAMOTRIGINE 150 MG PO TABS: 150.0000 mg | ORAL_TABLET | Freq: Two times a day (BID) | ORAL | 1 refills | Status: DC

## 2018-04-02 MED ORDER — LORAZEPAM 2 MG PO TABS: 2.0000 mg | ORAL_TABLET | Freq: Two times a day (BID) | ORAL | 1 refills | Status: DC

## 2018-04-02 MED ORDER — CARIPRAZINE HCL 4.5 MG PO CAPS: 4.5000 mg | ORAL_CAPSULE | Freq: Every day | ORAL | 1 refills | Status: DC

## 2018-04-02 MED ORDER — ARIPIPRAZOLE 5 MG PO TABS: 2.5000 mg | ORAL_TABLET | Freq: Every day | ORAL | 1 refills | Status: DC

## 2018-04-02 NOTE — Progress Notes (Signed)
[facility] MD/PA/NP OP Progress Note  04/02/2018 3:50 PM Jill Alexander  MRN:  [id number]  Chief Complaint:  Chief Complaint    Depression; Anxiety; Follow-up     HPI: Patient tells me that she has been experiencing a lot of anxiety related to finals.  She is worried about failing and is imagining multiple scenarios that cause her to get in trouble or fail.  She does not actually think that she is in danger of failing but cannot stop the thoughts.  She states that she will not feel better until she sees her final grade in the classes.  She is also anxious because her mother is out of town this week and patient misses her very much.  Jill Alexander has been spending a lot of time alone in the house while her father is working.  She is continuing to have irritability although it is less intense.  She finds herself yelling at some of the loud clocks in the house.  She states that her mood remains depressed but is not as labile as before.  Jill Alexander denies SI/HI. Marland Kitchen    Visit Diagnosis:    ICD-10-CM   1. Mood disorder in conditions classified elsewhere F06.30 ARIPiprazole (ABILIFY) 5 MG tablet    lamoTRIgine (LAMICTAL) 150 MG tablet    Cariprazine HCl 4.5 MG CAPS  2. Bipolar 2 disorder (HCC) F31.81 ARIPiprazole (ABILIFY) 5 MG tablet    lamoTRIgine (LAMICTAL) 150 MG tablet    Cariprazine HCl 4.5 MG CAPS  3. Panic attacks F41.0 LORazepam (ATIVAN) 2 MG tablet      Past Psychiatric History: Patient denies any history of prior psychiatric hospitalizations.  She was treated with medications in the past-Lamictal, Abilify- flat and lethargic, Wellbutrin, Prozac, Ativan, Lithium.  Prior to coming to [facility] outpatient patient was receiving treatment at [facility]. In [place] pt worked with one psychiatrist for over 10 yrs. Pt denies any hx of previous suicide attempts.   Past Medical History:  Past Medical History:  Diagnosis Date  . Anxiety   . Autism   . Depression   . GERD (gastroesophageal reflux disease)   .  Seizures (HCC)   . 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 NO PAST SURGERIES      Family Psychiatric  History:  Family History  Problem Relation Age of Onset  . Depression Father   . Anxiety disorder Sister   . Bipolar disorder Sister   . Diabetes Maternal Grandfather   . Cancer Maternal Grandfather   . Breast cancer Paternal Grandmother   . Uterine cancer Paternal Grandmother   . Irritable bowel syndrome Paternal Grandmother   . Colon cancer Neg Hx     Social History:  Social History   Socioeconomic History  . Marital status: Single    Spouse name: Not on file  . Number of children: Not on file  . Years of education: Not on file  . Highest education level: Not on file  Occupational History  . Not on file  Social Needs  . Financial resource strain: Not on file  . Food insecurity:    Worry: Not on file    Inability: Not on file  . Transportation needs:    Medical: Not on file    Non-medical: Not on file  Tobacco Use  . Smoking status: Never Smoker  . Smokeless tobacco: Never Used  Substance and Sexual Activity  . Alcohol use: No  . Drug use: No  .  Sexual activity: Never  Lifestyle  . Physical activity:    Days per week: Not on file    Minutes per session: Not on file  . Stress: Not on file  Relationships  . Social connections:    Talks on phone: Not on file    Gets together: Not on file    Attends religious service: Not on file    Active member of club or organization: Not on file    Attends meetings of clubs or organizations: Not on file    Relationship status: Not on file  Other Topics Concern  . Not on file  Social History Narrative   Lives with parents in [place]. Pt moved here from [place] in 2018.   Caffeine use: 2-3 cups per day   Left handed     Allergies: No Known Allergies  Metabolic Disorder Labs: No results found for: HGBA1C, MPG No results found for: PROLACTIN No results found for:  CHOL, TRIG, HDL, CHOLHDL, VLDL, LDLCALC Lab Results  Component Value Date   TSH 1.340 11/17/2017   TSH 0.677 08/21/2017    Therapeutic Level Labs: Lab Results  Component Value Date   LITHIUM 0.8 01/05/2018   LITHIUM 0.5 (L) 12/18/2017   No results found for: VALPROATE No components found for:  CBMZ  Current Medications: Current Outpatient Medications  Medication Sig Dispense Refill  . acetaminophen (TYLENOL) 500 MG tablet Take 500 mg by mouth every 6 (six) hours as needed.    . ARIPiprazole (ABILIFY) 5 MG tablet Take 0.5 tablets (2.5 mg total) by mouth daily. 15 tablet 1  . Cariprazine HCl 4.5 MG CAPS Take 1 capsule (4.5 mg total) by mouth daily. 30 capsule 1  . diphenhydrAMINE (BENADRYL) 25 MG tablet Take 25 mg by mouth every 6 (six) hours as needed.    . Ibuprofen (MOTRIN PO) Take 1 tablet by mouth as needed.     . lamoTRIgine (LAMICTAL) 150 MG tablet Take 1 tablet (150 mg total) by mouth 2 (two) times daily. 60 tablet 1  . LORazepam (ATIVAN) 2 MG tablet Take 1 tablet (2 mg total) by mouth 2 (two) times daily. 60 tablet 1  . metoCLOPramide (REGLAN) 10 MG tablet Take 1 tablet (10 mg total) by mouth every 6 (six) hours. 30 tablet 0  . Multiple Vitamins-Minerals (MULTIVITAMIN PO) Take 1 tablet by mouth daily.    . Norethin Ace-Eth Estrad-FE (TAYTULLA) 1-20 MG-MCG(24) CAPS Take 1 tablet by mouth daily. 28 capsule 12  . omeprazole (PRILOSEC) 20 MG capsule Take 1 capsule (20 mg total) by mouth 2 (two) times daily before a meal. 60 capsule 3  . phenazopyridine (PYRIDIUM) 200 MG tablet Take 1 tablet (200 mg total) by mouth 3 (three) times daily. 6 tablet 0  . polyethylene glycol (MIRALAX) packet Take 17 g by mouth daily as needed. 14 each 0  . promethazine (PHENERGAN) 12.5 MG tablet Take 1 tablet (12.5 mg total) by mouth at bedtime. May also take 1 tablet every 8 hours as needed for nausea. (Patient not taking: Reported on 04/02/2018) 30 tablet 1   No current facility-administered  medications for this visit.      Musculoskeletal: Strength & Muscle Tone: within normal limits Gait & Station: normal Patient leans: N/A  Psychiatric Specialty Exam: Review of Systems  Psychiatric/Behavioral: Positive for depression. Negative for suicidal ideas. The patient is nervous/anxious. The patient does not have insomnia.     Blood pressure 133/72, pulse (!) 110, height 5\' 3"  (1.6 m), weight 140 lb  9.6 oz (63.8 kg), SpO2 96 %.Body mass index is 24.91 kg/m.  General Appearance: Casual  Eye Contact:  Minimal  Speech:  Clear and Coherent and Slow  Volume:  Normal  Mood:  Anxious and Depressed  Affect:  Congruent and Constricted  Thought Process:  Goal Directed and Descriptions of Associations: Intact  Orientation:  Full (Time, Place, and Person)  Thought Content:  Logical  Suicidal Thoughts:  No  Homicidal Thoughts:  No  Memory:  Immediate;   Good  Judgement:  Fair  Insight:  Fair  Psychomotor Activity:  Tremor  Concentration:  Concentration: Good  Recall:  Good  Fund of Knowledge:  Good  Language:  Good  Akathisia:  No  Handed:  Right  AIMS (if indicated):     Assets:  Desire for Improvement Housing Social Support Talents/Skills Transportation  ADL's:  Intact  Cognition:  WNL  Sleep:   fair       Screenings: GAD-7     Office Visit from 01/15/2018 in [facility] Office Visit from 12/17/2017 in [facility] Office Visit from 08/21/2017 in [facility]  Total GAD-7 Score  15  14  18     PHQ2-9     Office Visit from 01/15/2018 in [facility] Office Visit from 12/17/2017 in [facility] Office Visit from 08/21/2017 in [facility]  PHQ-2 Total Score  5  5  6   PHQ-9 Total Score  20  17  18       I Reviewed the information below on 04/02/2018 and have updated it Assessment and Plan: Mood disorder NOS versus bipolar 2 disorder-current  episode depressed; seizure disorder; autism spectrum    Medication management with supportive therapy. Risks and benefits, side effects and alternative treatment options discussed with patient. Pt was given an opportunity to ask questions about medication, illness, and treatment. All current psychiatric medications have been reviewed and discussed with the patient and adjusted as clinically appropriate. The patient has been provided an accurate and updated list of the medications being now prescribed. Pt verbalized understanding and verbal consent obtained for treatment.  The risk of un-intended pregnancy is low based on the fact that pt reports she is using daily OCP. Pt is aware that these meds carry a teratogenic risk. Pt will discuss plan of action if she does or plans to become pregnant in the future.  Status of current problems: ongoing depression  Meds: Cariprazine 4.5 mg p.o. daily Lamictal 150 mg p.o. twice daily Ativan 2mg  po BID prn anxiety/mood swings Increase Abilify 2.5mg  po qHS for mood-and mom both report improvement with previous use.  It was stopped due to excessive fatigue so will titrate up slowly  Labs: none  Therapy: brief supportive therapy provided. Discussed psychosocial stressors in detail.    Consultations: Encouraged to follow up with therapist-Dr. Jeanene Erb Starting DBT thru [facility] (referred by therapist) Encouraged to follow up with PCP as needed Referred for PHP and will have assessment done in Jan 2020 after she gets insurance  Pt's acute risk factors for suicide are ongoing depression with on/off passive SI. Pt's chronic risk factors are chronic mental illness. Pt's protective factors are social support. Pt denies SI today and is at an acute low risk for suicide. Patient told to call clinic if any problems occur. Patient advised to go to ER if they should develop SI/HI, side effects, or if symptoms worsen. Pt  has crisis numbers to call if needed. Pt  acknowledged and agreed with plan and verbalized understanding.  F/up in 2 months or sooner if needed  The duration of this appointment visit was 25 minutes of face-to-face time with the patient.  Greater than 50% of this time was spent in counseling, explanation of  diagnosis, planning of further management, and coordination of care    Oletta DarterSalina Kelsie Zaborowski, MD 04/02/2018, 3:50 PM

## 2018-04-10 ENCOUNTER — Encounter: Payer: Self-pay | Admitting: Family

## 2018-04-10 ENCOUNTER — Ambulatory Visit (INDEPENDENT_AMBULATORY_CARE_PROVIDER_SITE_OTHER): Payer: Medicaid Other | Admitting: Family

## 2018-04-10 DIAGNOSIS — N926 Irregular menstruation, unspecified: Secondary | ICD-10-CM | POA: Diagnosis not present

## 2018-04-10 MED ORDER — NORETHIN ACE-ETH ESTRAD-FE 1-20 MG-MCG(24) PO CAPS: 1.0000 | ORAL_CAPSULE | Freq: Every day | ORAL | 12 refills | Status: DC

## 2018-04-10 NOTE — Progress Notes (Signed)
Patient is here today for Contraception follow up. Patient reports since being on Taytulla for severe menstrual cramps, her cramps have much better.

## 2018-04-10 NOTE — Progress Notes (Signed)
History:  Ms. Jill Alexander is a 22 y.o. who presents to clinic today for oral contraception follow-up.  Given a RX for dysmenorrhea in August 2019.  Reports its working well.  Reviewed pap screening, reports last screened in 2017 in a different state, results normal.  Desires refill of oral contraceptions.     The following portions of the patient's history were reviewed and updated as appropriate: allergies, current medications, family history, past medical history, social history, past surgical history and problem list.  Review of Systems:  Review of Systems  Pertinent info in HPI   Objective:  Physical Exam BP 108/72 (BP Location: Left Arm, Patient Position: Sitting, Cuff Size: Normal)   Pulse 76   Resp 16   Ht 5\' 2"  (1.575 m)   Wt 143 lb 3.2 oz (65 kg)   BMI 26.19 kg/m  Physical Exam Constitutional:      General: She is not in acute distress.    Appearance: She is well-developed.  HENT:     Head: Normocephalic.  Neck:     Musculoskeletal: Normal range of motion and neck supple.  Neurological:     Mental Status: She is alert and oriented to person, place, and time.    Labs and Imaging No results found for this or any previous visit (from the past 24 hour(s)).  No results found.   Assessment & Plan:  Dysmenorrhea - Follow-up  Plan: Continue current medication Evette Doffing(Taytulla)  Follow-up at office for any GYN concerns  Amedeo GoryKarim-Rhoades, Walidah N, CNM 04/10/2018 11:57 AM

## 2018-04-16 ENCOUNTER — Ambulatory Visit (INDEPENDENT_AMBULATORY_CARE_PROVIDER_SITE_OTHER): Payer: Medicaid Other | Admitting: Clinical

## 2018-04-16 ENCOUNTER — Ambulatory Visit: Payer: Self-pay | Admitting: Gastroenterology

## 2018-04-16 DIAGNOSIS — F840 Autistic disorder: Secondary | ICD-10-CM

## 2018-04-17 ENCOUNTER — Ambulatory Visit: Payer: Medicaid Other | Admitting: Gastroenterology

## 2018-04-17 ENCOUNTER — Encounter: Payer: Self-pay | Admitting: Gastroenterology

## 2018-04-17 VITALS — BP 124/70 | HR 72 | Wt 142.2 lb

## 2018-04-17 DIAGNOSIS — K219 Gastro-esophageal reflux disease without esophagitis: Secondary | ICD-10-CM | POA: Diagnosis not present

## 2018-04-17 DIAGNOSIS — R1310 Dysphagia, unspecified: Secondary | ICD-10-CM

## 2018-04-17 DIAGNOSIS — R1013 Epigastric pain: Secondary | ICD-10-CM

## 2018-04-17 DIAGNOSIS — R112 Nausea with vomiting, unspecified: Secondary | ICD-10-CM

## 2018-04-17 MED ORDER — AMBULATORY NON FORMULARY MEDICATION: 0 refills | Status: DC

## 2018-04-17 MED ORDER — ONDANSETRON 8 MG PO TBDP: 8.0000 mg | ORAL_TABLET | Freq: Three times a day (TID) | ORAL | 3 refills | Status: DC | PRN

## 2018-04-17 MED ORDER — OMEPRAZOLE 40 MG PO CPDR: 40.0000 mg | DELAYED_RELEASE_CAPSULE | Freq: Two times a day (BID) | ORAL | 1 refills | Status: DC

## 2018-04-17 NOTE — Patient Instructions (Addendum)
If you are age 24 or older, your body mass index should be between 23-30. Your Body mass index is 26.01 kg/m. If this is out of the aforementioned range listed, please consider follow up with your Primary Care Provider.  If you are age 24 or younger, your body mass index should be between 19-25. Your Body mass index is 26.01 kg/m. If this is out of the aformentioned range listed, please consider follow up with your Primary Care Provider.   You have been scheduled for an endoscopy. Please follow written instructions given to you at your visit today. If you use inhalers (even only as needed), please bring them with you on the day of your procedure. Your physician has requested that you go to www.startemmi.com and enter the access code given to you at your visit today. This web site gives a general overview about your procedure. However, you should still follow specific instructions given to you by our office regarding your preparation for the procedure.  We have sent the following medications to your pharmacy for you to pick up at your convenience: Zofran Omeprazole  We have given you samples of the following medication to take: FDgard: Take as directed on the box  Discontinue Reglan.  Thank you for entrusting me with your care and for choosing [facility], Dr. Ileene PatrickSteven Armbruster

## 2018-04-17 NOTE — Progress Notes (Signed)
HPI :  22 y/o female with a history of autism, depression, seizure disorder, here for a follow up visit for nausea and upper tract symptoms. She is accompanied by her mother today.  He was previously seen for symptoms of reflux and nausea, also upper abdominal discomfort. She takes Lamictal chronically. She's had a workup with negative celiac serologies in May along with normal IgA level. We placed her on omeprazole 20 mg once a day, she had a negative stool test for H. Pylori prior to starting PPI. We gave her some MiraLAX for constipation. She intervally increased omeprazole to 20mg  BID.  She reports she is has some improvement in her symptoms on the omeprazole however she continues to have significant symptoms of bother her. She has episodic nausea which can bother her severely. There does not appear to be any clear trigger pattern to this. She is nauseated most days of the week. Sometimes she wakes up with it, sometimes is postprandial. She often does not vomit. She has some epigastric discomfort with eating, sometimes this is not related to eating. She does have some heartburn but his is much less than previous since being on the omeprazole. She has some rare dysphagia to pills and globus sensation. She had been on a lower dose of Zofran which provided some benefit to an extent. This was switched to Phenergan to try to get better nausea control, she stopped this due to concerns about potential interaction with ativan. She was recently given a trial of Reglan 10 mg every 6 hours. She's been using it once a day at most, she does not think this provided to much benefit for her.    Past Medical History:  Diagnosis Date  . Anxiety   . Autism   . Depression   . GERD (gastroesophageal reflux disease)   . Seizures (HCC)   . 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 NO PAST SURGERIES     Family History  Problem Relation Age of Onset  . Depression  Father   . Anxiety disorder Sister   . Bipolar disorder Sister   . Diabetes Maternal Grandfather   . Cancer Maternal Grandfather   . Breast cancer Paternal Grandmother   . Uterine cancer Paternal Grandmother   . Irritable bowel syndrome Paternal Grandmother   . Colon cancer Neg Hx    Social History   Tobacco Use  . Smoking status: Never Smoker  . Smokeless tobacco: Never Used  Substance Use Topics  . Alcohol use: No  . Drug use: No   Current Outpatient Medications  Medication Sig Dispense Refill  . acetaminophen (TYLENOL) 500 MG tablet Take 500 mg by mouth every 6 (six) hours as needed.    . ARIPiprazole (ABILIFY) 5 MG tablet Take 0.5 tablets (2.5 mg total) by mouth daily. 15 tablet 1  . Cariprazine HCl 4.5 MG CAPS Take 1 capsule (4.5 mg total) by mouth daily. 30 capsule 1  . diphenhydrAMINE (BENADRYL) 25 MG tablet Take 25 mg by mouth every 6 (six) hours as needed.    . Ibuprofen (MOTRIN PO) Take 1 tablet by mouth as needed.     . lamoTRIgine (LAMICTAL) 150 MG tablet Take 1 tablet (150 mg total) by mouth 2 (two) times daily. 60 tablet 1  . LORazepam (ATIVAN) 2 MG tablet Take 1 tablet (2 mg total) by mouth 2 (two) times daily. 60 tablet 1  . metoCLOPramide (REGLAN) 10 MG tablet Take 1  tablet (10 mg total) by mouth every 6 (six) hours. 30 tablet 0  . Multiple Vitamins-Minerals (MULTIVITAMIN PO) Take 1 tablet by mouth daily.    . Norethin Ace-Eth Estrad-FE (TAYTULLA) 1-20 MG-MCG(24) CAPS Take 1 tablet by mouth daily. 28 capsule 12  . omeprazole (PRILOSEC) 20 MG capsule Take 1 capsule (20 mg total) by mouth 2 (two) times daily before a meal. 60 capsule 3  . polyethylene glycol (MIRALAX) packet Take 17 g by mouth daily as needed. 14 each 0   No current facility-administered medications for this visit.    No Known Allergies   Review of Systems: All systems reviewed and negative except where noted in HPI.   Lab Results  Component Value Date   WBC 9.4 12/17/2017   HGB 13.5  12/17/2017   HCT 40.1 12/17/2017   MCV 93 12/17/2017   PLT 300 12/17/2017    Lab Results  Component Value Date   CREATININE 0.67 01/05/2018   BUN 7 01/05/2018   NA 141 01/05/2018   K 4.8 01/05/2018   CL 106 01/05/2018   CO2 22 01/05/2018    Lab Results  Component Value Date   ALT 15 01/05/2018   AST 22 01/05/2018   ALKPHOS 36 (L) 01/05/2018   BILITOT 0.3 01/05/2018     Physical Exam: BP 124/70   Pulse 72   Wt 142 lb 3.2 oz (64.5 kg)   SpO2 97%   BMI 26.01 kg/m  Constitutional: Pleasant,well-developed,female in no acute distress. HEENT: Normocephalic and atraumatic. Conjunctivae are normal. No scleral icterus. Neck supple.  Cardiovascular: Normal rate, regular rhythm.  Pulmonary/chest: Effort normal and breath sounds normal. No wheezing, rales or rhonchi. Abdominal: Soft, nondistended, nontender. There are no masses palpable. No hepatomegaly. Extremities: no edema Lymphadenopathy: No cervical adenopathy noted. Neurological: Alert and oriented to person place and time. Skin: Skin is warm and dry. No rashes noted. Psychiatric: Normal mood and affect. Behavior is normal.   ASSESSMENT AND PLAN: [age] year old female here for reassessment of the following issues:  GERD / dysphagia to pills / dyspepsia / nausea - tested negative for H. Pylori and celiac disease. She's been on a variety of antiemetics, with some variable benefit. Omeprazole 20 mg twice a day has provided some benefit for her. Given some of her ongoing reflux recommend we give her a short trial of high-dose omeprazole 40 mg twice a day to see if that can provide additional benefit. I'll give her higher dose Zofran to use as needed to see if that helps her nausea. I would stop her Reglan right now, he has not provided much benefit for her. Ultimately recommend EGD at this point to further assess symptoms, rule out eosinophilic esophagitis, large hiatal hernia, PUD, etc. If this exam is negative, and her symptoms  persist, we may consider a gastric emptying study. If that is negative, suspect this could be functional dyspepsia, although also possible could be a reaction to Lamictal as small percentage of patients to experience nausea and upper tract symptoms with chronic therapy. She may consider speaking with her prescribing provider to hold Lamictal for a period of time if she does not improve. Will await her course initially with EGD and change of meds as outlined. Patient and mom agreed with the plan, all questions answered.   Ileene PatrickSteven , MD [facility]

## 2018-04-20 ENCOUNTER — Ambulatory Visit (INDEPENDENT_AMBULATORY_CARE_PROVIDER_SITE_OTHER): Payer: Self-pay | Admitting: Nurse Practitioner

## 2018-04-20 ENCOUNTER — Ambulatory Visit (AMBULATORY_SURGERY_CENTER): Payer: Medicaid Other | Admitting: Gastroenterology

## 2018-04-20 ENCOUNTER — Encounter: Payer: Self-pay | Admitting: Gastroenterology

## 2018-04-20 VITALS — BP 103/53 | HR 49 | Temp 99.6°F | Resp 14 | Ht 62.0 in | Wt 142.0 lb

## 2018-04-20 DIAGNOSIS — K2950 Unspecified chronic gastritis without bleeding: Secondary | ICD-10-CM

## 2018-04-20 DIAGNOSIS — K2970 Gastritis, unspecified, without bleeding: Secondary | ICD-10-CM | POA: Diagnosis not present

## 2018-04-20 DIAGNOSIS — R1310 Dysphagia, unspecified: Secondary | ICD-10-CM | POA: Diagnosis not present

## 2018-04-20 DIAGNOSIS — R112 Nausea with vomiting, unspecified: Secondary | ICD-10-CM

## 2018-04-20 DIAGNOSIS — K219 Gastro-esophageal reflux disease without esophagitis: Secondary | ICD-10-CM

## 2018-04-20 DIAGNOSIS — R1013 Epigastric pain: Secondary | ICD-10-CM | POA: Diagnosis not present

## 2018-04-20 MED ORDER — SODIUM CHLORIDE 0.9 % IV SOLN: 500.0000 mL | Freq: Once | INTRAVENOUS | Status: DC

## 2018-04-20 NOTE — Op Note (Signed)
[facility] Patient Name: Jill Alexander Procedure Date: 04/20/2018 10:28 AM MRN: [id number] Endoscopist: Viviann Spare P. Adela Lank , MD Age: 22 Referring MD:  Date of Birth: 19-Dec-1995 Gender: Female Account #: [id number] Procedure:                Upper GI endoscopy Indications:              Dyspepsia, Dysphagia, Follow-up of                            gastro-esophageal reflux disease, Nausea - recently                            on trial of high dose omeprazole 40mg  twice daily,                            and Zofran PRN - unclear of Lamictal is associated Medicines:                Monitored Anesthesia Care Procedure:                Pre-Anesthesia Assessment:                           - Prior to the procedure, a History and Physical                            was performed, and patient medications and                            allergies were reviewed. The patient's tolerance of                            previous anesthesia was also reviewed. The risks                            and benefits of the procedure and the sedation                            options and risks were discussed with the patient.                            All questions were answered, and informed consent                            was obtained. Prior Anticoagulants: The patient has                            taken no previous anticoagulant or antiplatelet                            agents. ASA Grade Assessment: II - A patient with                            mild systemic disease. After reviewing the risks  and benefits, the patient was deemed in                            satisfactory condition to undergo the procedure.                           After obtaining informed consent, the endoscope was                            passed under direct vision. Throughout the                            procedure, the patient's blood pressure, pulse, and                            oxygen  saturations were monitored continuously. The                            Model GIF-[id number]) scope was introduced                            through the mouth, and advanced to the second part                            of duodenum. The upper GI endoscopy was                            accomplished without difficulty. The patient                            tolerated the procedure well. Scope In: Scope Out: Findings:                 Esophagogastric landmarks were identified: the                            Z-line was found at 38 cm, the gastroesophageal                            junction was found at 38 cm and the upper extent of                            the gastric folds was found at 38 cm from the                            incisors.                           The exam of the esophagus was otherwise normal. No                            focal stenosis / stricture appreciated, no                            inflammatory  changes.                           Biopsies were taken with a cold forceps in the                            upper third of the esophagus, in the middle third                            of the esophagus and in the lower third of the                            esophagus for histology.                           The entire examined stomach was normal. Biopsies                            were taken with a cold forceps for Helicobacter                            pylori testing.                           The duodenal bulb and second portion of the                            duodenum were normal. Biopsies for histology were                            taken with a cold forceps for evaluation of celiac                            disease. Complications:            No immediate complications. Estimated blood loss:                            Minimal. Estimated Blood Loss:     Estimated blood loss was minimal. Impression:               - Esophagogastric landmarks identified.                            - Normal esophagus otherwise - biopsies take to                            rule out EoE                           - Normal stomach. Biopsied to rule out H pylori.                           - Normal duodenal bulb and second portion of the  duodenum. Biopsied to rule out celiac disease. Recommendation:           - Patient has a contact number available for                            emergencies. The signs and symptoms of potential                            delayed complications were discussed with the                            patient. Return to normal activities tomorrow.                            Written discharge instructions were provided to the                            patient.                           - Resume previous diet.                           - Continue present medications.                           - Await pathology results.                           - Consideration for gastric emptying study if                            pathology results unremarkable, versus trial of                            holding Lamictal Esco Joslyn P. Kytzia Gienger, MD 04/20/2018 10:46:52 AM This report has been signed electronically.

## 2018-04-20 NOTE — Patient Instructions (Signed)
Await pathology results.  YOU HAD AN ENDOSCOPIC PROCEDURE TODAY AT THE [facility]:   Refer to the procedure report that was given to you for any specific questions about what was found during the examination.  If the procedure report does not answer your questions, please call your gastroenterologist to clarify.  If you requested that your care partner not be given the details of your procedure findings, then the procedure report has been included in a sealed envelope for you to review at your convenience later.  YOU SHOULD EXPECT: Some feelings of bloating in the abdomen. Passage of more gas than usual.  Walking can help get rid of the air that was put into your GI tract during the procedure and reduce the bloating. If you had a lower endoscopy (such as a colonoscopy or flexible sigmoidoscopy) you may notice spotting of blood in your stool or on the toilet paper. If you underwent a bowel prep for your procedure, you may not have a normal bowel movement for a few days.  Please Note:  You might notice some irritation and congestion in your nose or some drainage.  This is from the oxygen used during your procedure.  There is no need for concern and it should clear up in a day or so.  SYMPTOMS TO REPORT IMMEDIATELY:     Following upper endoscopy (EGD)  Vomiting of blood or coffee ground material  New chest pain or pain under the shoulder blades  Painful or persistently difficult swallowing  New shortness of breath  Fever of 100F or higher  Black, tarry-looking stools  For urgent or emergent issues, a gastroenterologist can be reached at any hour by calling [phone].   DIET:  We do recommend a small meal at first, but then you may proceed to your regular diet.  Drink plenty of fluids but you should avoid alcoholic beverages for 24 hours.  ACTIVITY:  You should plan to take it easy for the rest of today and you should NOT DRIVE or use heavy machinery until tomorrow (because  of the sedation medicines used during the test).    FOLLOW UP: Our staff will call the number listed on your records the next business day following your procedure to check on you and address any questions or concerns that you may have regarding the information given to you following your procedure. If we do not reach you, we will leave a message.  However, if you are feeling well and you are not experiencing any problems, there is no need to return our call.  We will assume that you have returned to your regular daily activities without incident.  If any biopsies were taken you will be contacted by phone or by letter within the next 1-3 weeks.  Please call us at [phone] if you have not heard about the biopsies in 3 weeks.    SIGNATURES/CONFIDENTIALITY: You and/or your care partner have signed paperwork which will be entered into your electronic medical record.  These signatures attest to the fact that that the information above on your After Visit Summary has been reviewed and is understood.  Full responsibility of the confidentiality of this discharge information lies with you and/or your care-partner. 

## 2018-04-20 NOTE — Progress Notes (Signed)
Called to room to assist during endoscopic procedure.  Patient ID and intended procedure confirmed with present staff. Received instructions for my participation in the procedure from the performing physician.  

## 2018-04-20 NOTE — Progress Notes (Signed)
PT taken to PACU. Monitors in place. VSS. Report given to RN. 

## 2018-04-21 ENCOUNTER — Other Ambulatory Visit (INDEPENDENT_AMBULATORY_CARE_PROVIDER_SITE_OTHER): Payer: Self-pay | Admitting: Physician Assistant

## 2018-04-21 ENCOUNTER — Telehealth: Payer: Self-pay | Admitting: *Deleted

## 2018-04-21 NOTE — Telephone Encounter (Signed)
First attempt, no answer and mailbox is full.  °

## 2018-04-23 NOTE — Telephone Encounter (Signed)
FWD to PCP. Tempestt S Roberts, CMA  

## 2018-05-04 ENCOUNTER — Other Ambulatory Visit (HOSPITAL_COMMUNITY): Payer: No Typology Code available for payment source | Attending: Psychiatry | Admitting: Professional

## 2018-05-04 ENCOUNTER — Ambulatory Visit (INDEPENDENT_AMBULATORY_CARE_PROVIDER_SITE_OTHER): Payer: Medicaid Other | Admitting: Clinical

## 2018-05-04 DIAGNOSIS — F3181 Bipolar II disorder: Secondary | ICD-10-CM | POA: Insufficient documentation

## 2018-05-04 DIAGNOSIS — F410 Panic disorder [episodic paroxysmal anxiety] without agoraphobia: Secondary | ICD-10-CM | POA: Insufficient documentation

## 2018-05-04 DIAGNOSIS — F840 Autistic disorder: Secondary | ICD-10-CM | POA: Insufficient documentation

## 2018-05-05 ENCOUNTER — Telehealth (INDEPENDENT_AMBULATORY_CARE_PROVIDER_SITE_OTHER): Payer: Self-pay | Admitting: Physician Assistant

## 2018-05-05 ENCOUNTER — Other Ambulatory Visit: Payer: Self-pay | Admitting: Gastroenterology

## 2018-05-05 ENCOUNTER — Other Ambulatory Visit (INDEPENDENT_AMBULATORY_CARE_PROVIDER_SITE_OTHER): Payer: Self-pay | Admitting: Physician Assistant

## 2018-05-05 DIAGNOSIS — F0630 Mood disorder due to known physiological condition, unspecified: Secondary | ICD-10-CM

## 2018-05-05 NOTE — Progress Notes (Signed)
[facility]  PATIENT: Jill Alexander DOB: October 07, 1995   REASON FOR VISIT: Follow-up for seizure disorder, significant anxiety disorder HISTORY FROM: Patient and mom    HISTORY OF PRESENT ILLNESS:UPDATE 1/9/2020CM Ms. Jill Alexander, 23 year old female returns for follow-up with history of Asperger syndrome and history of seizure disorder.  Her last seizure occurred in 2015.  EEG in 2018 was normal.  When last seen by Dr. Anne HahnWillis the patient was complaining of significant nausea from the Keppra and this was titrated off because the patient remained on Lamictal 150 twice daily.  Her Lamictal level at that time was 3.6.  Patient returns today wanting to discuss coming off of her Lamictal which is originally been ordered by her psychiatrist.  She claims she has significant GI upset from the Lamictal.  She is currently taking 150 mg twice daily she has been to a GI physician who could find no organic reason for her GI upset.  She has Zofran and Prilosec to take.  .  Mother tells me she also has significant anxiety disorder and she is due to have inpatient behavioral health next week for her severe anxiety.  She did not go to school this semester.  She sees her psychiatrist on a routine basis.  She returns for reevaluation.  Patient has failed Topamax and Mysoline in the past due to side effects. 6/13/19KWMs. Jill Alexander is a 23 year old left-handed white female with a history of Asperger's syndrome and a history of seizures.  The patient is on Keppra, she currently is on 250 mg twice daily, she also takes 200 mg of Lamictal daily.  The patient comes in today indicating that her psychiatry physician wonders if she could come off of the Keppra as this can sometimes worsen anxiety and cause agitation.  The patient has not had any further seizures since last seen.  She continues to have tremors involving both upper extremities.  She returns for an evaluation.  REVIEW OF SYSTEMS: Full 14 system review  of systems performed and notable only for those listed, all others are neg:  Constitutional: Fatigue Cardiovascular: neg Ear/Nose/Throat: neg  Skin: neg Eyes: neg Respiratory: neg Gastroitestinal: Nausea Hematology/Lymphatic: neg  Endocrine: neg Musculoskeletal:neg Allergy/Immunology: neg Neurological: History of seizures in the past Psychiatric: Severe anxiety disorder depression Sleep : neg   ALLERGIES: No Known Allergies  HOME MEDICATIONS: Outpatient Medications Prior to Visit  Medication Sig Dispense Refill  . acetaminophen (TYLENOL) 500 MG tablet Take 500 mg by mouth every 6 (six) hours as needed.    . ARIPiprazole (ABILIFY) 5 MG tablet Take 0.5 tablets (2.5 mg total) by mouth daily. 15 tablet 1  . Cariprazine HCl 4.5 MG CAPS Take 1 capsule (4.5 mg total) by mouth daily. 30 capsule 1  . Ibuprofen (MOTRIN PO) Take 1 tablet by mouth as needed.     . lamoTRIgine (LAMICTAL) 150 MG tablet Take 1 tablet (150 mg total) by mouth 2 (two) times daily. 60 tablet 1  . LORazepam (ATIVAN) 2 MG tablet Take 1 tablet (2 mg total) by mouth 2 (two) times daily. 60 tablet 1  . Multiple Vitamins-Minerals (MULTIVITAMIN PO) Take 1 tablet by mouth daily.    . Norethin Ace-Eth Estrad-FE (TAYTULLA) 1-20 MG-MCG(24) CAPS Take 1 tablet by mouth daily. 28 capsule 12  . omeprazole (PRILOSEC) 40 MG capsule Take 1 capsule (40 mg total) by mouth 2 (two) times daily. 60 capsule 1  . ondansetron (ZOFRAN ODT) 8 MG disintegrating tablet Take 1 tablet (8 mg total) by  mouth every 8 (eight) hours as needed for nausea or vomiting. 60 tablet 3  . polyethylene glycol (MIRALAX) packet Take 17 g by mouth daily as needed. 14 each 0   No facility-administered medications prior to visit.     PAST MEDICAL HISTORY: Past Medical History:  Diagnosis Date  . Anxiety   . Autism   . Depression   . GERD (gastroesophageal reflux disease)   . Seizures (HCC)   . Tremor, 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    PAST SURGICAL HISTORY: Past  Surgical History:  Procedure Laterality Date  . NO PAST SURGERIES      FAMILY HISTORY: Family History  Problem Relation Age of Onset  . Depression Father   . Anxiety disorder Sister   . Bipolar disorder Sister   . Diabetes Maternal Grandfather   . Cancer Maternal Grandfather   . Breast cancer Paternal Grandmother   . Uterine cancer Paternal Grandmother   . Irritable bowel syndrome Paternal Grandmother   . Colon cancer Neg Hx   . Esophageal cancer Neg Hx   . Stomach cancer Neg Hx   . Rectal cancer Neg Hx     SOCIAL HISTORY: Social History   Socioeconomic History  . Marital status: Single    Spouse name: Not on file  . Number of children: Not on file  . Years of education: Not on file  . Highest education level: Not on file  Occupational History  . Not on file  Social Needs  . Financial resource strain: Not on file  . Food insecurity:    Worry: Not on file    Inability: Not on file  . Transportation needs:    Medical: Not on file    Non-medical: Not on file  Tobacco Use  . Smoking status: Never Smoker  . Smokeless tobacco: Never Used  Substance and Sexual Activity  . Alcohol use: No  . Drug use: No  . Sexual activity: Never  Lifestyle  . Physical activity:    Days per week: Not on file    Minutes per session: Not on file  . Stress: Not on file  Relationships  . Social connections:    Talks on phone: Not on file    Gets together: Not on file    Attends religious service: Not on file    Active member of club or organization: Not on file    Attends meetings of clubs or organizations: Not on file    Relationship status: Not on file  . Intimate partner violence:    Fear of current or ex partner: Not on file    Emotionally abused: Not on file    Physically abused: Not on file    Forced sexual activity: Not on file  Other Topics Concern  . Not on file  Social History Narrative   [place] in [place]. Pt moved here from [place] in 2018.    Caffeine use: 2-3 cups per day   Left handed      PHYSICAL EXAM  Vitals:   05/07/18 1336  BP: 111/74  Pulse: 89  Weight: 144 lb 9.6 oz (65.6 kg)  Height: 5\' 3"  (1.6 m)   Body mass index is 25.61 kg/m.  Generalized: Well developed, in no acute distress  Head: normocephalic and atraumatic,. Oropharynx benign  Neck: Supple,  Musculoskeletal: No deformity   Neurological examination   Mentation: Alert oriented to time, place, history taking. Attention span and concentration appropriate. Recent and remote memory intact.  Follows all commands speech  and language fluent.   Cranial nerve II-XII: Pupils were equal round reactive to light extraocular movements were full, visual field were full on confrontational test. Facial sensation and strength were normal. hearing was intact to finger rubbing bilaterally. Uvula tongue midline. head turning and shoulder shrug were normal and symmetric.Tongue protrusion into cheek strength was normal. Motor: normal bulk and tone, full strength in the BUE, BLE,  Sensory: normal and symmetric to light touch, pinprick, and  Vibration, in the upper and lower extremities Coordination: finger-nose-finger, heel-to-shin bilaterally, no dysmetria Reflexes: Symmetric upper and lower, plantar responses were flexor bilaterally. Gait and Station: Rising up from seated position without assistance, normal stance,  moderate stride, good arm swing, smooth turning, able to perform tiptoe, and heel walking without difficulty. Tandem gait is mildly unsteady  DIAGNOSTIC DATA (LABS, IMAGING, TESTING) - I reviewed patient records, labs, notes, testing and imaging myself where available.  Lab Results  Component Value Date   WBC 9.4 12/17/2017   HGB 13.5 12/17/2017   HCT 40.1 12/17/2017   MCV 93 12/17/2017   PLT 300 12/17/2017      Component Value Date/Time   NA 141 01/05/2018 0942   K 4.8 01/05/2018 0942   CL 106 01/05/2018 0942   CO2 22 01/05/2018 0942   GLUCOSE 82  01/05/2018 0942   BUN 7 01/05/2018 0942   CREATININE 0.67 01/05/2018 0942   CALCIUM 9.4 01/05/2018 0942   PROT 5.9 (L) 01/05/2018 0942   ALBUMIN 4.6 01/05/2018 0942   AST 22 01/05/2018 0942   ALT 15 01/05/2018 0942   ALKPHOS 36 (L) 01/05/2018 0942   BILITOT 0.3 01/05/2018 0942   GFRNONAA 124 01/05/2018 0942   GFRAA 143 01/05/2018 0942    Lab Results  Component Value Date   TSH 1.340 11/17/2017      ASSESSMENT AND PLAN  23 y.o. year old female  has a past medical history of Anxiety, Asperger syndrome, Depression, GERD (gastroesophageal reflux disease), Seizures (HCC), and Tremor, essential (04/09/2017).  And significant anxiety disorder here to follow-up for seizures which are well controlled last in 2015.  Asperger syndrome.  Upper extremity tremors.  Patient successfully tapered off Keppra after her last visit due to GI side effects.  She is now complaining of GI side effects to Lamictal and wants to taper off of this medication.  PLAN: Patient now is complaining of GI side effects on Lamictal and wants to come off of that medication as well Discussed with Dr. Anne Hahn she can decrease by 50 mg every 2 to 3 weeks for slow titration but he would prefer she stay on the medication at least at dose of 100mg  twice daily.  Last Lamictal level was 3.6. Please discuss with psych on next visit  F/U 6 months and as needed Discussed several other options for seizure control however all of them have nausea as a side effect Nilda Riggs, [facility], APRN  [facility] [address], [address] [place], [place] [address] [phone]

## 2018-05-05 NOTE — Telephone Encounter (Signed)
FWD to PCP. Kippy Melena S Christien Berthelot, CMA  

## 2018-05-05 NOTE — Telephone Encounter (Signed)
Referral has been made and [facility] Outpatient Referral Request has been filled out and given to Ms. Max Sane.

## 2018-05-05 NOTE — Telephone Encounter (Signed)
Referral has been [facility] to [facility] [place].   Louisa Second

## 2018-05-05 NOTE — Telephone Encounter (Signed)
Patient called to request a referral to [facility] in [place]. Stating that her insurance is requesting a referral in order to receive services for  The Partial Hospitalization Program.  Please advice [phone] or [phone] ( Mother )  Thank you Louisa Second

## 2018-05-06 ENCOUNTER — Other Ambulatory Visit: Payer: Self-pay

## 2018-05-06 ENCOUNTER — Encounter: Payer: Self-pay | Admitting: Family Medicine

## 2018-05-06 ENCOUNTER — Ambulatory Visit (INDEPENDENT_AMBULATORY_CARE_PROVIDER_SITE_OTHER): Payer: No Typology Code available for payment source | Admitting: Family Medicine

## 2018-05-06 VITALS — BP 110/70 | HR 76 | Ht 63.75 in | Wt 143.2 lb

## 2018-05-06 DIAGNOSIS — F0630 Mood disorder due to known physiological condition, unspecified: Secondary | ICD-10-CM

## 2018-05-06 DIAGNOSIS — F419 Anxiety disorder, unspecified: Secondary | ICD-10-CM | POA: Diagnosis not present

## 2018-05-06 DIAGNOSIS — F32A Depression, unspecified: Secondary | ICD-10-CM | POA: Insufficient documentation

## 2018-05-06 DIAGNOSIS — R350 Frequency of micturition: Secondary | ICD-10-CM | POA: Diagnosis not present

## 2018-05-06 DIAGNOSIS — F840 Autistic disorder: Secondary | ICD-10-CM | POA: Diagnosis not present

## 2018-05-06 DIAGNOSIS — F329 Major depressive disorder, single episode, unspecified: Secondary | ICD-10-CM

## 2018-05-06 LAB — POCT URINALYSIS DIP (PROADVANTAGE DEVICE)
Bilirubin, UA: NEGATIVE
Blood, UA: NEGATIVE
GLUCOSE UA: NEGATIVE mg/dL
Ketones, POC UA: NEGATIVE mg/dL
LEUKOCYTES UA: NEGATIVE
Nitrite, UA: NEGATIVE
PROTEIN UA: NEGATIVE mg/dL
SPECIFIC GRAVITY, URINE: 1.03
UUROB: NEGATIVE
pH, UA: 6 (ref 5.0–8.0)

## 2018-05-06 MED ORDER — OMEPRAZOLE 40 MG PO CPDR: 40.0000 mg | DELAYED_RELEASE_CAPSULE | Freq: Two times a day (BID) | ORAL | 1 refills | Status: DC

## 2018-05-06 NOTE — Telephone Encounter (Signed)
We rec'd refill request for omeprazole 20mg  bid but at her last appt on 04-17-18 Dr. Adela Lank started her on a trial of 40mg  bid. Called and LM for pt on home number to call back to clarify if she is wanting to stay on omeprazole 40mg  bid or go back to 20mg  bid.

## 2018-05-06 NOTE — Progress Notes (Signed)
Spoke to pt.  She forgot Dr. Adela Lank suggested she try increasing her omeprazole to 40mg  BID.  Refused refill request at 20mg  bid and resent 40mg  bid.

## 2018-05-06 NOTE — Progress Notes (Signed)
   Subjective:    Patient ID: Jill Alexander, female    DOB: Jun 16, 1995, 23 y.o.   MRN: [id number]  HPI Chief Complaint  Patient presents with  . new pt    new pt, get established. new referral to beahivioral health. urinary issues- frequency, no burning, - about a year   She is new to the practice and here to establish care. Her mother is with her today Previous medical care: [facility]  History of mood disorder, anxiety, depression, autism spectrum disorder which are all being managed by her psychiatrist and counselors.  She is on several psychotropic medications.   Requests referral to partial hospitalization program at [facility]. States her insurance requires a referral by a PCP and not her psychiatrist.   Psychiatrist- Dr. Michae Kava  OB/GYN - Thressa Sheller, CNM- [facility]  Neuro- Dr. Charna [facility]  - Dr. Rene Kocher GI- Dr. Adela Lank Cardiology - Dr. Anne Fu   Complains of a several month history of intermittent urinary frequency. States she has had this checked at urgent care and there was no sign of infection.  Has had one UTI in the distant past.  No urgency, dysuria, nocturia. No incontinence issues.   Drinks caffeine, 3 beverages per day.   LMP: not having periods since starting on birth control     Contraception: OCPs  Reviewed allergies, medications, past medical, surgical, and social history.  Review of Systems Pertinent positives and negatives in the history of present illness.     Objective:   Physical Exam BP 110/70   Pulse 76   Ht 5' 3.75" (1.619 m)   Wt 143 lb 3.2 oz (65 kg)   BMI 24.77 kg/m   Alert and oriented and in no acute distress. Not otherwise examined.       Assessment & Plan:  Urinary frequency - Plan: Urine Culture, POCT Urinalysis DIP (Proadvantage Device)  Mood disorder in conditions classified elsewhere - Plan: Ambulatory referral to Behavioral Health  Anxiety and  depression - Plan: Ambulatory referral to Behavioral Health  Autism spectrum disorder  She is a 23 year old female who is here today with her mother to establish care.  She has a complex mental health history including anxiety and depression and autism spectrum disorder that is being managed by her psychiatrist and counselor.  Her mother states the patient is new insurance plan requires a PCP to refer her to behavioral health.  They are trying to get her into a partial hospitalization program through [facility] behavioral health. She is not in any acute distress today. She has multiple specialists caring for her including a cardiologist, GI, OB/GYN. Discussed that intermittent urinary frequency over the past several months does not appear to be related to infectious process.  We will check a urine culture.  If this persists she will follow-up.

## 2018-05-07 ENCOUNTER — Encounter: Payer: Self-pay | Admitting: Nurse Practitioner

## 2018-05-07 ENCOUNTER — Ambulatory Visit (INDEPENDENT_AMBULATORY_CARE_PROVIDER_SITE_OTHER): Payer: No Typology Code available for payment source | Admitting: Nurse Practitioner

## 2018-05-07 VITALS — BP 111/74 | HR 89 | Ht 63.0 in | Wt 144.6 lb

## 2018-05-07 DIAGNOSIS — F840 Autistic disorder: Secondary | ICD-10-CM

## 2018-05-07 DIAGNOSIS — G250 Essential tremor: Secondary | ICD-10-CM | POA: Diagnosis not present

## 2018-05-07 DIAGNOSIS — R569 Unspecified convulsions: Secondary | ICD-10-CM

## 2018-05-07 LAB — URINE CULTURE

## 2018-05-07 NOTE — Progress Notes (Signed)
I have read the note, and I agree with the clinical assessment and plan.  Charles K Willis   

## 2018-05-07 NOTE — Patient Instructions (Signed)
Patient titrated successfully off of Keppra due to GI side effects Patient now is complaining of GI side effects on Lamictal and wants to come off of that medication as well Discussed with Dr. Anne HahnWillis she can decrease by 50 mg every 2 to 3 weeks for slow titration but he would prefer she stay on the medication Please discuss with psych F/U as needed

## 2018-05-11 ENCOUNTER — Telehealth (HOSPITAL_COMMUNITY): Payer: Self-pay | Admitting: Licensed Clinical Social Worker

## 2018-05-12 ENCOUNTER — Ambulatory Visit (INDEPENDENT_AMBULATORY_CARE_PROVIDER_SITE_OTHER): Payer: No Typology Code available for payment source | Admitting: Clinical

## 2018-05-12 DIAGNOSIS — F840 Autistic disorder: Secondary | ICD-10-CM

## 2018-05-13 NOTE — Psych (Signed)
Comprehensive Clinical Assessment (CCA) Note  05/13/2018 Jill BickerMadeline Sarah Alexander [id number]  Visit Diagnosis:      ICD-10-CM   1. Bipolar 2 disorder (HCC) F31.81   2. Panic attacks F41.0       CCA Part One  Part One has been completed on paper by the patient.  (See scanned document in Chart Review)  CCA Part Two A  Intake/Chief Complaint:  CCA Intake With Chief Complaint CCA Part Two Date: 05/04/18 CCA Part Two Time: 1430 Chief Complaint/Presenting Problem: Pt reports for ax for PHP. Pt shares she has anxiety and depression. Pt shares she has a hx of SI but not current plan/intent.  Pt states she has struggled since moving to [place] from [place] in 07/18 due to lack of social supports. Pt shares she struggles to meet others due to her anxiety and hx of autism dx. Pt states she was volunteering 1x a week but has been unable to do so recently due to her anxiety and depression. Pt wants to get a job and go back to school; reports her sx have prevented her. Pt reports decreased ADLs, especially showering. Pt shares she has "outbursts" and "loses tempter". Pt reports hx of counseling and psychiatry; pt is currently followed by Dr. Michae KavaAgarwal for psychiatry and Luanna ColeJenna Mendleson for counseling. Pt is also currently [facility] and is using a therapy light daily. Pt appears to have a tick of the mouth and facial movements but was unaware of behavior when asked. Pt denies HI/AVH Patients Currently Reported Symptoms/Problems: increased anxiety and depression; passive SI; mood labile; hypersomnia; anhedonia; excessive worrying; low energy; decreased ADLs Individual's Strengths: Family Support, kind, motivation for tx Individual's Abilities: able bodied, makes connections Type of Services Patient Feels Are Needed: Group Therapy Initial Clinical Notes/Concerns: Pt reports hx of ASD. Pt appears to be able to make connections and may benefit from group. Cln and pt willing to try.   Mental  Health Symptoms Depression:  Depression: Difficulty Concentrating, Change in energy/activity, Irritability, Sleep (too much or little), Tearfulness, Worthlessness, Hopelessness  Mania:     Anxiety:   Anxiety: Difficulty concentrating, Irritability, Restlessness, Sleep, Worrying, Tension  Psychosis:     Trauma:     Obsessions:     Compulsions:     Inattention:  Inattention: Does not seem to listen, Fails to pay attention/makes careless mistakes, Forgetful, Loses things, Poor follow-through on tasks, Symptoms before age 25, Symptoms present in 2 or more settings, Disorganized, Avoids/dislikes activities that require focus  Hyperactivity/Impulsivity:     Oppositional/Defiant Behaviors:     Borderline Personality:     Other Mood/Personality Symptoms:      Mental Status Exam Appearance and self-care  Stature:  Stature: Small  Weight:  Weight: Average weight  Clothing:  Clothing: Disheveled, Casual  Grooming:  Grooming: Neglected  Cosmetic use:  Cosmetic Use: None  Posture/gait:  Posture/Gait: Stooped  Motor activity:  Motor Activity: Tremor, Agitated  Sensorium  Attention:  Attention: Distractible, Confused  Concentration:  Concentration: Anxiety interferes  Orientation:  Orientation: X5  Recall/memory:  Recall/Memory: Normal  Affect and Mood  Affect:  Affect: Anxious, Tearful  Mood:  Mood: Anxious, Pessimistic, Irritable  Relating  Eye contact:  Eye Contact: Avoided  Facial expression:  Facial Expression: Constricted, Anxious  Attitude toward examiner:  Attitude Toward Examiner: Cooperative  Thought and Language  Speech flow: Speech Flow: Normal  Thought content:  Thought Content: Appropriate to mood and circumstances  Preoccupation:  Hallucinations:     Organization:     Company secretary of Knowledge:  Fund of Knowledge: Average  Intelligence:  Intelligence: Average  Abstraction:  Abstraction: Normal  Judgement:  Judgement: Fair  Dance movement psychotherapist:  Reality  Testing: Adequate  Insight:  Insight: Flashes of insight  Decision Making:  Decision Making: Paralyzed  Social Functioning  Social Maturity:  Social Maturity: Self-centered, Isolates  Social Judgement:  Social Judgement: Naive  Stress  Stressors:  Stressors: Arts administrator, Work, Transitions  Coping Ability:  Coping Ability: Deficient supports  Skill Deficits:     Supports:      Family and Psychosocial History: Family history Marital status: Single Are you sexually active?: No Does patient have children?: No  Childhood History:  Childhood History By whom was/is the patient raised?: Both parents Additional childhood history information: moved frequently, lived internationally in [place] and [place]. Description of patient's relationship with caregiver when they were a child: Patient reports good relationship with both parents. Patient's description of current relationship with people who raised him/her: Pt shares she still has good relationship with both parents. Pt currently lives with parents. Does patient have siblings?: Yes Number of Siblings: 2 Description of patient's current relationship with siblings: older sister and brother; pt reports she is becoming closer with her sister Did patient suffer any verbal/emotional/physical/sexual abuse as a child?: No Did patient suffer from severe childhood neglect?: No Has patient ever been sexually abused/assaulted/raped as an adolescent or adult?: No Was the patient ever a victim of a crime or a disaster?: No Witnessed domestic violence?: No Has patient been effected by domestic violence as an adult?: No  CCA Part Two B  Employment/Work Situation: Employment / Work Psychologist, occupational Employment situation: Biomedical scientist job has been impacted by current illness: (Pt has not been able to get a job due to symptoms) Did You Receive Any Psychiatric Treatment/Services While in Equities trader?: No Are There Guns or Other Weapons in Your Home?:  No  Education: Education Last Grade Completed: 12 Did Garment/textile technologist From [employer]?: Yes Did Theme park manager?: Yes What Type of College Degree Do you Have?: Pt worked towards 2 year degree; did not complete Did Designer, television/film set?: No Did You Have Any Difficulty At [employer]?: Yes  Religion: Religion/Spirituality Are You A Religious Person?: No  Leisure/Recreation: Leisure / Recreation Leisure and Hobbies: books, reading, fantasy  Exercise/Diet: Exercise/Diet Do You Exercise?: No Have You Gained or Lost A Significant Amount of Weight in the Past Six Months?: No Do You Follow a Special Diet?: No Do You Have Any Trouble Sleeping?: No Explanation of Sleeping Difficulties: hypersomnia; pt reports sleep is "sometimes broken"  CCA Part Two C  Alcohol/Drug Use: Alcohol / Drug Use Pain Medications: see MAR Prescriptions: see MAR Over the Counter: see MAR History of alcohol / drug use?: No history of alcohol / drug abuse     CCA Part Three  ASAM's:  Six Dimensions of Multidimensional Assessment  Dimension 1:  Acute Intoxication and/or Withdrawal Potential:     Dimension 2:  Biomedical Conditions and Complications:     Dimension 3:  Emotional, Behavioral, or Cognitive Conditions and Complications:     Dimension 4:  Readiness to Change:     Dimension 5:  Relapse, Continued use, or Continued Problem Potential:     Dimension 6:  Recovery/Living Environment:      Substance use Disorder (SUD)    Social Function:  Social Functioning Social Maturity: Self-centered, Isolates Social Judgement: Naive  Stress:  Stress Stressors: Money, Work, Transitions Coping Ability: Deficient supports Patient Takes Medications The Way The Doctor Instructed?: Yes Priority Risk: Moderate Risk  Risk Assessment- Self-Harm Potential: Risk Assessment For Self-Harm Potential Thoughts of Self-Harm: Vague current thoughts Method: No plan Additional Comments for Self-Harm Potential: Pt  reports passive SI but denies plan/intent  Risk Assessment -Dangerous to Others Potential: Risk Assessment For Dangerous to Others Potential Method: No Plan  DSM5 Diagnoses: Patient Active Problem List   Diagnosis Date Noted  . Autism spectrum disorder 05/06/2018  . Anxiety and depression 05/06/2018  . 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 Seizures (HCC) 04/09/2017  . Tremor, 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    Patient Centered Plan: Patient is on the following Treatment Plan(s):  Anxiety  Recommendations for Services/Supports/Treatments: Recommendations for Services/Supports/Treatments Recommendations For Services/Supports/Treatments: Partial Hospitalization([facility]. Pt reports increase in dep. and anx. symptoms. Pt reports lack of support and coping skills. Pt reports passive SI. )  Treatment Plan Summary:  Pt reports "I don't know what to do to not feel like this."  Referrals to Alternative Service(s): Referred to Alternative Service(s):   Place:   Date:   Time:    Referred to Alternative Service(s):   Place:   Date:   Time:    Referred to Alternative Service(s):   Place:   Date:   Time:    Referred to Alternative Service(s):   Place:   Date:   Time:     Jakelyn Squyres J Irine Heminger, LPCA, LCASA

## 2018-05-18 ENCOUNTER — Ambulatory Visit (HOSPITAL_COMMUNITY): Payer: Self-pay

## 2018-05-20 ENCOUNTER — Telehealth (HOSPITAL_COMMUNITY): Payer: Self-pay | Admitting: Licensed Clinical Social Worker

## 2018-05-21 ENCOUNTER — Ambulatory Visit (INDEPENDENT_AMBULATORY_CARE_PROVIDER_SITE_OTHER): Payer: No Typology Code available for payment source | Admitting: Psychiatry

## 2018-05-21 ENCOUNTER — Encounter (HOSPITAL_COMMUNITY): Payer: Self-pay | Admitting: Psychiatry

## 2018-05-21 VITALS — BP 114/71 | HR 104 | Ht 63.0 in | Wt 142.0 lb

## 2018-05-21 DIAGNOSIS — F3181 Bipolar II disorder: Secondary | ICD-10-CM

## 2018-05-21 DIAGNOSIS — F410 Panic disorder [episodic paroxysmal anxiety] without agoraphobia: Secondary | ICD-10-CM | POA: Diagnosis not present

## 2018-05-21 DIAGNOSIS — F0630 Mood disorder due to known physiological condition, unspecified: Secondary | ICD-10-CM

## 2018-05-21 MED ORDER — LORAZEPAM 2 MG PO TABS: 2.0000 mg | ORAL_TABLET | Freq: Two times a day (BID) | ORAL | 1 refills | Status: DC

## 2018-05-21 MED ORDER — CARIPRAZINE HCL 4.5 MG PO CAPS: 4.5000 mg | ORAL_CAPSULE | Freq: Every day | ORAL | 1 refills | Status: DC

## 2018-05-21 MED ORDER — SELEGILINE 6 MG/24HR TD PT24: 6.0000 mg | MEDICATED_PATCH | Freq: Every day | TRANSDERMAL | 12 refills | Status: DC

## 2018-05-21 MED ORDER — LAMOTRIGINE 150 MG PO TABS: 150.0000 mg | ORAL_TABLET | Freq: Two times a day (BID) | ORAL | 1 refills | Status: DC

## 2018-05-21 NOTE — Progress Notes (Signed)
BH MD/PA/NP OP Progress Note  05/21/2018 1:04 PM Jill Alexander  MRN:  [id number]  Chief Complaint:  Chief Complaint    Depression     HPI: Jill CaneMattie shares that the Abilify does not seem to be helping.  She experiences depression every day.  It can last for hours or the entire day.  It can come on at any time and essentially the rest of her day is ruined.  She states that her mother thinks is due to boredom and lack of social interactions.  Jill Alexander admits that she has not made any friends.  She has been spending a lot of time alone in the house.  She does have the ongoing irritability issues.  We discussed some concerns that she was having regarding being forced to do things like shower.  She states that due to her autism she cannot tolerate much touch and showers because significant distress.  Today she is denying SI/HI but did admit that she has on and off SI.  She states that her overall quality of life is poor due to her depression.  In the past nothing is really helped and she would like to try the Emsam patch.   Visit Diagnosis:    ICD-10-CM   1. Bipolar 2 disorder (HCC) F31.81 lamoTRIgine (LAMICTAL) 150 MG tablet    Cariprazine HCl 4.5 MG CAPS  2. Mood disorder in conditions classified elsewhere F06.30 lamoTRIgine (LAMICTAL) 150 MG tablet    Cariprazine HCl 4.5 MG CAPS    selegiline (EMSAM) 6 MG/24HR  3. Panic attacks F41.0 LORazepam (ATIVAN) 2 MG tablet      Past Psychiatric History: Patient denies any history of prior psychiatric hospitalizations.  She was treated with medications in the past-Lamictal, Abilify- flat and lethargic, Wellbutrin, Prozac, Ativan, Lithium.  [facility]. In [place] pt worked with one psychiatrist for over 10 yrs. Pt denies any hx of previous suicide attempts.   Past Medical History:  Past Medical History:  Diagnosis Date  . Anxiety   . Autism   . Depression   . GERD  (gastroesophageal reflux disease)   . Seizures (HCC)   . 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 NO PAST SURGERIES      Family Psychiatric  History:  Family History  Problem Relation Age of Onset  . Depression Father   . Anxiety disorder Sister   . Bipolar disorder Sister   . Diabetes Maternal Grandfather   . Cancer Maternal Grandfather   . Breast cancer Paternal Grandmother   . Uterine cancer Paternal Grandmother   . Irritable bowel syndrome Paternal Grandmother   . Colon cancer Neg Hx   . Esophageal cancer Neg Hx   . Stomach cancer Neg Hx   . Rectal cancer Neg Hx     Social History:  Social History   Socioeconomic History  . Marital status: Single    Spouse name: Not on file  . Number of children: Not on file  . Years of education: Not on file  . Highest education level: Not on file  Occupational History  . Not on file  Social Needs  . Financial resource strain: Not on file  . Food insecurity:    Worry: Not on file    Inability: Not on file  . Transportation needs:    Medical: Not on file    Non-medical: Not on file  Tobacco Use  .  Smoking status: Never Smoker  . Smokeless tobacco: Never Used  Substance and Sexual Activity  . Alcohol use: No  . Drug use: No  . Sexual activity: Never  Lifestyle  . Physical activity:    Days per week: Not on file    Minutes per session: Not on file  . Stress: Not on file  Relationships  . Social connections:    Talks on phone: Not on file    Gets together: Not on file    Attends religious service: Not on file    Active member of club or organization: Not on file    Attends meetings of clubs or organizations: Not on file    Relationship status: Not on file  Other Topics Concern  . Not on file  Social History Narrative   Lives with parents in [place]. Pt moved here from [place] in 2018.   Caffeine use: 2-3 cups per day   Left handed     Allergies: No Known  Allergies  Metabolic Disorder Labs: No results found for: HGBA1C, MPG No results found for: PROLACTIN No results found for: CHOL, TRIG, HDL, CHOLHDL, VLDL, LDLCALC Lab Results  Component Value Date   TSH 1.340 11/17/2017   TSH 0.677 08/21/2017    Therapeutic Level Labs: Lab Results  Component Value Date   LITHIUM 0.8 01/05/2018   LITHIUM 0.5 (L) 12/18/2017   No results found for: VALPROATE No components found for:  CBMZ  Current Medications: Current Outpatient Medications  Medication Sig Dispense Refill  . acetaminophen (TYLENOL) 500 MG tablet Take 500 mg by mouth every 6 (six) hours as needed.    . Cariprazine HCl 4.5 MG CAPS Take 1 capsule (4.5 mg total) by mouth daily. 30 capsule 1  . Ibuprofen (MOTRIN PO) Take 1 tablet by mouth as needed.     . lamoTRIgine (LAMICTAL) 150 MG tablet Take 1 tablet (150 mg total) by mouth 2 (two) times daily. 60 tablet 1  . LORazepam (ATIVAN) 2 MG tablet Take 1 tablet (2 mg total) by mouth 2 (two) times daily. 60 tablet 1  . Multiple Vitamins-Minerals (MULTIVITAMIN PO) Take 1 tablet by mouth daily.    . Norethin Ace-Eth Estrad-FE (TAYTULLA) 1-20 MG-MCG(24) CAPS Take 1 tablet by mouth daily. 28 capsule 12  . omeprazole (PRILOSEC) 40 MG capsule Take 1 capsule (40 mg total) by mouth 2 (two) times daily. 60 capsule 1  . ondansetron (ZOFRAN ODT) 8 MG disintegrating tablet Take 1 tablet (8 mg total) by mouth every 8 (eight) hours as needed for nausea or vomiting. 60 tablet 3  . polyethylene glycol (MIRALAX) packet Take 17 g by mouth daily as needed. 14 each 0  . selegiline (EMSAM) 6 MG/24HR Place 1 patch (6 mg total) onto the skin daily. 30 patch 12   No current facility-administered medications for this visit.      Musculoskeletal: Strength & Muscle Tone: within normal limits Gait & Station: normal Patient leans: N/A  Psychiatric Specialty Exam: Review of Systems  Constitutional: Negative for chills, diaphoresis and fever.  Respiratory:  Negative for cough, sputum production and shortness of breath.     Blood pressure 114/71, pulse (!) 104, height 5\' 3"  (1.6 m), weight 142 lb (64.4 kg), SpO2 97 %.Body mass index is 25.15 kg/m.  General Appearance: Casual  Eye Contact:  Fair  Speech:  Clear and Coherent and Slow  Volume:  Normal  Mood:  Depressed  Affect:  Congruent  Thought Process:  Goal Directed and Descriptions of  Associations: Intact  Orientation:  Full (Time, Place, and Person)  Thought Content:  Logical  Suicidal Thoughts:  No  Homicidal Thoughts:  No  Memory:  Immediate;   Good  Judgement:  Good  Insight:  Good  Psychomotor Activity:  Normal  Concentration:  Concentration: Good  Recall:  Good  Fund of Knowledge:  Good  Language:  Good  Akathisia:  No  Handed:  Right  AIMS (if indicated):     Assets:  Communication Skills Desire for Improvement Financial Resources/Insurance Housing Social Support Talents/Skills Transportation  ADL's:  Intact  Cognition:  WNL  Sleep:            Screenings: GAD-7     [facility]  Total GAD-7 Score  15  14  18     PHQ2-9     [facility]  PHQ-2 Total Score  5  5  6   PHQ-9 Total Score  20  17  18       I reviewed the information below on 05/21/2018 and have updated it Assessment and Plan: Mood disorder NOS versus bipolar 2 disorder-current episode depressed; seizure disorder; autism spectrum    Medication management with supportive therapy. Risks and benefits, side effects and alternative treatment options discussed with patient. Pt was given an opportunity to ask questions about medication,  illness, and treatment. All current psychiatric medications have been reviewed and discussed with the patient and adjusted as clinically appropriate. The patient has been provided an accurate and updated list of the medications being now prescribed. Pt verbalized understanding and verbal consent obtained for treatment.  The risk of un-intended pregnancy is low based on the fact that pt reports she is using daily OCP. Pt is aware that these meds carry a teratogenic risk. Pt will discuss plan of action if she does or plans to become pregnant in the future.  Status of current problems: ongoing depression  Meds: Cariprazine 4.5 mg p.o. daily Lamictal 150 mg p.o. twice daily Ativan 2mg  po BID prn anxiety/mood swings D/c Abilify Start trial of Emsam patch 6mg  to skin daily  Labs: none  Therapy: brief supportive therapy provided. Discussed psychosocial stressors in detail.  - talked about showerless wipes as option for good hygeine  Consultations: Encouraged to follow up with therapist-Dr. Jeanene ErbMandelson [facility] ([facility] by therapist) Encouraged to follow up with PCP as needed Referred for PHP- will start tomorrow  Pt's acute risk factors for suicide are ongoing depression with on/off passive SI. Pt's chronic risk factors are chronic mental illness. Pt's protective factors are social support. Pt denies SI today and is at an acute low risk for suicide. Patient told to call clinic if any problems occur. Patient advised to go to ER if they should develop SI/HI, side effects, or if symptoms worsen. Pt has crisis numbers to call if needed. Pt acknowledged and agreed with plan and verbalized understanding.  F/up in 2 months or sooner if needed  The duration of this appointment visit was 25 minutes of face-to-face time with the patient.  Greater than 50% of this time was spent in counseling, explanation of  diagnosis, planning of further management, and coordination of  care    Oletta Darter, MD 05/21/2018, 1:04 PM

## 2018-05-22 ENCOUNTER — Other Ambulatory Visit (HOSPITAL_COMMUNITY): Payer: No Typology Code available for payment source | Admitting: Occupational Therapy

## 2018-05-22 ENCOUNTER — Other Ambulatory Visit: Payer: Self-pay

## 2018-05-22 ENCOUNTER — Encounter (HOSPITAL_COMMUNITY): Payer: Self-pay | Admitting: Family

## 2018-05-22 ENCOUNTER — Encounter (HOSPITAL_COMMUNITY): Payer: Self-pay | Admitting: Occupational Therapy

## 2018-05-22 ENCOUNTER — Other Ambulatory Visit (HOSPITAL_COMMUNITY): Payer: No Typology Code available for payment source | Admitting: Licensed Clinical Social Worker

## 2018-05-22 DIAGNOSIS — R4589 Other symptoms and signs involving emotional state: Secondary | ICD-10-CM

## 2018-05-22 DIAGNOSIS — F410 Panic disorder [episodic paroxysmal anxiety] without agoraphobia: Secondary | ICD-10-CM | POA: Diagnosis not present

## 2018-05-22 DIAGNOSIS — F0630 Mood disorder due to known physiological condition, unspecified: Secondary | ICD-10-CM

## 2018-05-22 DIAGNOSIS — F3181 Bipolar II disorder: Secondary | ICD-10-CM | POA: Diagnosis present

## 2018-05-22 DIAGNOSIS — F070 Personality change due to known physiological condition: Secondary | ICD-10-CM

## 2018-05-22 DIAGNOSIS — F840 Autistic disorder: Secondary | ICD-10-CM | POA: Diagnosis not present

## 2018-05-22 NOTE — Therapy (Signed)
[facility] [address] [place], [place], [address] Phone: [phone]   Fax:  [phone]  Occupational Therapy Evaluation  Patient Details  Name: Jill Alexander MRN: [id number] Date of Birth: May 11, 1995 Referring Provider (OT): Hillery Jacksanika Lewis, NP   Encounter Date: 05/22/2018  OT End of Session - 05/22/18 0950    Visit Number  1    Number of Visits  16    Date for OT Re-Evaluation  06/19/18    Authorization Type  [place] [facility] [employer]    Activity Tolerance  Patient tolerated treatment well    Behavior During Therapy  [facility] for tasks assessed/performed       Past Medical History:  Diagnosis Date  . Anxiety   . Autism   . Depression   . GERD (gastroesophageal reflux disease)   . Seizures (HCC)   . 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 NO PAST SURGERIES      There were no vitals filed for this visit.  Subjective Assessment - 05/22/18 0946    Currently in Pain?  No/denies        [facility] OT Assessment - 05/22/18 0001      Assessment   Medical Diagnosis  Bipolar 2 disorder    Referring Provider (OT)  [place] Jacksanika Lewis, NP    Onset Date/Surgical Date  05/22/18      Precautions   Precautions  None      Restrictions   Weight Bearing Restrictions  No      Balance Screen   Has the patient fallen in the past 6 months  No    Has the patient had a decrease in activity level because of a fear of falling?   No    Is the patient reluctant to leave their home because of a fear of falling?   No        OT assessment: OCAIRS  Diagnosis: Bipolar 2 disorder  Past medical history/referral information: Pt presents to PHP as a referral from an in house psychiatrist due to worsening mental health symptoms. Pt has history of high functioning autism spectrum disorder as well. Pt has very flat affect, but appears to be aware of her nonverbals, by making up for it  verbally.  Living situation: Pt lives with parents  ADLs/IADLs: Pt reports a decreased level of motivation in these areas. She shares that personal hygiene has been difficult, but she is showering ever other day. She shares that housework is mostly difficult and that she has little motivation to keep her space clean. Her appetite is baseline.  Sleep: Pt shares that she often sleeps too much for 9+ hours a day  Work: Pt is currently in a program through [employer] for individuals with disabilities to begin vocational training. She is very hopeful for a job. She completed some college, but struggled with the transition and structure involved with college. Pt does volunteer biweekly at the [employer].  Leisure: Pt shares that she enjoys reading, podcasts, and music. She reports that she has has increase difficulty reading as of late, due to focus and has to listen to audio related books.  Social support: Pt reports it is difficult for her to make friends, but has some apps that allow her to reach out and be social. She shares she has not yet gone to a meeting.  Struggles: sleep, reinforcing skills learned  OCAIRS Mental Health Interview Summary of Client Scores:  FACILITATES PARTICIPATION IN OCCUPATION  ALLOWS PARTICIPATION IN OCCUPATION INHIBITS PARTICIPATION IN OCCUPATION RESTRICTS PARTICIPATION IN OCCUPATION COMMENTS  ROLES                  X  Pt has a decreased role engagement right now, but is working towards improving this  HABITS                  X  Pt shares she has not been doing as much and it has been a struggle, but does continue to volunteer  PERSONAL CAUSATION                  X  Unable to predict success in future  VALUES                 X   Identifies 3 clear values  INTERESTS                  X  Identifies interests, but has decreased engagement/motivaiton   SKILLS                  X  Decreased concentration and decision making  SHORT TERM GOALS                  X  Loosely mentions  a few new years resolutions, has knowledge of goals- recalled a framework for them  LONG TERM GOALS                  X    INTERPETATION OF PAST EXPERIENCES                  X  Unable to recall best  PHYSICAL ENVIRONMENT                  X  Difficulty with home maintaining  SOCIAL ENVIRONMENT                  X  Has means, but difficulty with follow through  READINESS FOR CHANGE                  X  Difficulty adjusting     Need for Occupational Therapy:  4 Shows positive occupational participation, no need for OT.   3 Need for minimal intervention/consultative participation    X 2 Need for OT intervention indicated to restore/improve participation   1 Need for extensive OT intervention indicated to improve participation.  Referral for follow up services also recommended.    Assessment:  Patient demonstrates behavior that inhibits participation in occupation.  Patient will benefit from occupational therapy intervention in order to improve time management, financial management, stress management, job readiness skills, social skills, and health management skills in preparation to return to full time community living and to be a productive community member.    Plan:  Patient will participate in skilled occupational therapy sessions individually or in a group setting to improve coping skills, psychosocial skills, and emotional skills required to return to prior level of function.  Treatment will be 4 times per week for 4 weeks.     S: "I have some of these apps, I just get too anxious to do them"   O:Eduation given on importance of social participation when reintegrating into community. Further education given on varying types of social participation (emotional, tangible, informational, and social needs), how they can be of use, the barriers, and how to apply them to current problems. Additional information  given on community options for socially engaging leisure activities. Pt to choose one area  this date that will help improve social participation.   A: Pt presents to group with flat affect, engaged and participatory. Pt sharing that her social support is her therapist, sister, and parents. She shares that the distance between her and her sister complicates the relationship. She also shares that she has several social apps to help increase her community involvement, but she fears going alone. Pt committed to joining a book club in the near future for social and leisure participation.   P: OT will continue to follow up on social participation information for increased implementation into daily routine.                OT Short Term Goals - 05/22/18 0956      OT SHORT TERM GOAL #1   Title  Pt will be educated on strategies to improve psychosocial skills needed to participate fully in all daily, work, and leisure activities    Time  4    Period  Weeks    Status  New    Target Date  06/19/18      OT SHORT TERM GOAL #2   Title  Pt will apply psychosocial skills and coping mechanisms to daily activities in order to function independently and reintegrate into community dwelling    Time  4    Period  Weeks    Status  New    Target Date  06/19/18      OT SHORT TERM GOAL #3   Title  Pt will recall and/or apply 1-3 sleep hygiene strategies to improve BADL routine when reintegrating into community    Time  4    Period  Weeks    Status  New    Target Date  06/19/18      OT SHORT TERM GOAL #4   Title  Pt will engage in goal setting to improve functional BADL/IADL routine upon reintegrating into community    Time  4    Period  Weeks    Status  New    Target Date  06/19/18      OT SHORT TERM GOAL #5   Title  Pt will choose and/or engage in 1-3 socially engaging leisure activities to improve social participation upon re integrating into community    Time  4    Period  Weeks    Status  New    Target Date  06/19/18               Plan - 05/22/18 [id number]     Occupational performance deficits (Please refer to evaluation for details):  ADL's;IADL's;Rest and Sleep;Education;Work;Leisure;Social Participation    Rehab Potential  Good    OT Frequency  4x / week    OT Duration  4 weeks    OT Treatment/Interventions  Psychosocial skills training;Coping strategies training;Self-care/ADL training;Other (comment)   community reintegration      Patient will benefit from skilled therapeutic intervention in order to improve the following deficits and impairments:  Decreased coping skills, Decreased psychosocial skills, Other (comment)(decreased ability to engage in BADL and reintegrate into community)  Visit Diagnosis: Organic personality disorder  Difficulty coping    Problem List Patient Active Problem List   Diagnosis Date Noted  . Autism spectrum disorder 05/06/2018  . Anxiety and depression 05/06/2018  . 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 Seizures (HCC) 04/09/2017  . Tremor, 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   Lisette AbuKaylee  Leonie Man MSOT, OTR/L Behavioral Health OT/ Acute Relief OT PHP Office: [phone]  Dalphine Handing 05/22/2018, 10:08 AM  [facility] HOSPITALIZATION PROGRAM [address] [place], [place], [address] Phone: [phone]   Fax:  [phone]  Name: Vessie Olmsted MRN: [id number] Date of Birth: Feb 17, 1996

## 2018-05-22 NOTE — Progress Notes (Signed)
Behavioral Health Partial Program Assessment Note  Date: 05/22/2018 Name: Jill Alexander MRN: [id number]    HPI: Patient is a 23 y.o. Caucasian female presents with worsening depression. Jill Alexander reports mood irritability ability and social isolation related to her depression.  Reports intermittent thoughts of suicidal ideations however denies intent or plan.  Jill Alexander reports a recent medication adjustment by her psychiatrist. States this episode was triggered by stressors related to college classes. ([employer]) denies suicidal or homicidal ideations.  Denies auditory visual hallucinations.    Jill Alexander reports a history of self injures behaviors with cutting while in middle school.  Reports diagnoses of depression, anxiety, bipolar and states she is on the autistic spectrum.  Denies previous inpatient admissions.  Denies history of physical or sexual abuse.  Reports a family history of mental illness: Jill Alexander depression: Jill Alexander 9: Bipolar depression anxiety. Patient was enrolled in partial psychiatric program on 05/21/18.  Primary complaints include: anxiety, depression worse and difficulty with school.  Onset of symptoms was gradual with stable course since that time. Psychosocial Stressors include the following: family and school stressors.   I have reviewed the following documentation dated 05/22/2018: past psychiatric history and past medical history  Complaints of Pain: nonear Past Psychiatric History: Per MD assessment note on 1/23/2020Patient denies any history of prior psychiatric hospitalizations.  She was treated with medications in the past-Lamictal, Abilify- flat and lethargic, Wellbutrin, Prozac, Ativan, Lithium.  Prior to coming to [facility] outpatient patient was receiving treatment at [facility]. In [place] pt worked with one psychiatrist for over 10 yrs. Pt denies any hx of previous suicide attempts.   suicide attempts to cut her wrist in middle school.   Currently  in treatment with Cariprazine 4.5 mg p.o. daily Lamictal 150 mg p.o. twice daily Ativan 2mg  po BID prn anxiety/mood swings D/c Abilify Start trial of Emsam patch 6mg  to skin daily  Substance Abuse History: none Use of Alcohol: denied Use of Caffeine: denies use Use of over the counter:  Past Surgical History:  Procedure Laterality Date  . NO PAST SURGERIES      Past Medical History:  Diagnosis Date  . Anxiety   . Autism   . Depression   . GERD (gastroesophageal reflux disease)   . Seizures (HCC)   . 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 acetaminophen (TYLENOL) 500 MG tablet Take 500 mg by mouth every 6 (six) hours as needed.  . Cariprazine HCl 4.5 MG CAPS Take 1 capsule (4.5 mg total) by mouth daily.  . Ibuprofen (MOTRIN PO) Take 1 tablet by mouth as needed.   . lamoTRIgine (LAMICTAL) 150 MG tablet Take 1 tablet (150 mg total) by mouth 2 (two) times daily.  Marland Kitchen LORazepam (ATIVAN) 2 MG tablet Take 1 tablet (2 mg total) by mouth 2 (two) times daily.  . Multiple Vitamins-Minerals (MULTIVITAMIN PO) Take 1 tablet by mouth daily.  . Norethin Ace-Eth Estrad-FE (TAYTULLA) 1-20 MG-MCG(24) CAPS Take 1 tablet by mouth daily.  Marland Kitchen omeprazole (PRILOSEC) 40 MG capsule Take 1 capsule (40 mg total) by mouth 2 (two) times daily.  . ondansetron (ZOFRAN ODT) 8 MG disintegrating tablet Take 1 tablet (8 mg total) by mouth every 8 (eight) hours as needed for nausea or vomiting.  . polyethylene glycol (MIRALAX) packet Take 17 g by mouth daily as needed.  . selegiline (EMSAM) 6 MG/24HR Place 1 patch (6 mg total) onto the skin daily.   No facility-administered encounter medications  on file as of 05/22/2018.    No Known Allergies  Social History   Tobacco Use  . Smoking status: Never Smoker  . Smokeless tobacco: Never Used  Substance Use Topics  . Alcohol use: No   Functioning Relationships: good support system Education: College        Please specify degree: [employer] Other Pertinent History: None Family History  Problem Relation Age of Onset  . Depression Jill Alexander   . Anxiety disorder Jill Alexander   . Bipolar disorder Jill Alexander   . Diabetes Maternal Grandfather   . Cancer Maternal Grandfather   . Breast cancer Paternal Grandmother   . Uterine cancer Paternal Grandmother   . Irritable bowel syndrome Paternal Grandmother   . Colon cancer Neg Hx   . Esophageal cancer Neg Hx   . Stomach cancer Neg Hx   . Rectal cancer Neg Hx      Review of Systems Constitutional: negative  Objective:  There were no vitals filed for this visit.  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 Mood disorder in conditions classified elsewhere   2. Bipolar 2 disorder (HCC)     Indications for admission: inpatient care required if not in partial hospital program  Plan: Orders placed for occupational therapy (OT) patient enrolled in Partial Hospitalization Program, patient's current medications are to be continued, the following medications are being prescribed by Psychiatrist Michae Kava:   Meds: Cariprazine 4.5 mg p.o. daily Lamictal 150 mg p.o. twice daily Ativan 2mg  po BID prn anxiety/mood swings D/c Abilify Start trial of Emsam patch 6mg  to skin daily    Treatment options and alternatives reviewed with patient and patient  understands the above plan.  Treatment plan was reviewed and agreed upon by NP T. Melvyn Neth and patient [name] need for  group services    Oneta Rack, NP

## 2018-05-24 ENCOUNTER — Other Ambulatory Visit (HOSPITAL_COMMUNITY): Payer: Self-pay | Admitting: Psychiatry

## 2018-05-24 DIAGNOSIS — F0630 Mood disorder due to known physiological condition, unspecified: Secondary | ICD-10-CM

## 2018-05-24 DIAGNOSIS — F3181 Bipolar II disorder: Secondary | ICD-10-CM

## 2018-05-25 ENCOUNTER — Other Ambulatory Visit (HOSPITAL_COMMUNITY): Payer: No Typology Code available for payment source | Admitting: Licensed Clinical Social Worker

## 2018-05-25 ENCOUNTER — Other Ambulatory Visit (HOSPITAL_COMMUNITY): Payer: No Typology Code available for payment source | Admitting: Occupational Therapy

## 2018-05-25 ENCOUNTER — Encounter (HOSPITAL_COMMUNITY): Payer: Self-pay

## 2018-05-25 DIAGNOSIS — F3181 Bipolar II disorder: Secondary | ICD-10-CM

## 2018-05-25 DIAGNOSIS — F070 Personality change due to known physiological condition: Secondary | ICD-10-CM

## 2018-05-25 DIAGNOSIS — R4589 Other symptoms and signs involving emotional state: Secondary | ICD-10-CM

## 2018-05-25 NOTE — Therapy (Signed)
[facility] [address] [place], [place], [address] Phone: [phone]   Fax:  [phone]  Occupational Therapy Treatment  Patient Details  Name: Jill Alexander MRN: [id number] Date of Birth: 05/02/1995 Referring Provider (OT): Hillery Jacksanika Lewis, NP   Encounter Date: 05/25/2018  OT End of Session - 05/25/18 1346    Visit Number  2    Number of Visits  16    Date for OT Re-Evaluation  06/19/18    Authorization Type  [place] [employer], [employer]    OT Start Time  1100    OT Stop Time  1200    OT Time Calculation (min)  60 min    Activity Tolerance  Patient tolerated treatment well    Behavior During Therapy  [facility] for tasks assessed/performed       Past Medical History:  Diagnosis Date  . Anxiety   . Autism   . Depression   . GERD (gastroesophageal reflux disease)   . Seizures (HCC)   . 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 NO PAST SURGERIES      There were no vitals filed for this visit.  Subjective Assessment - 05/25/18 1344    Currently in Pain?  No/denies        S: "I know a lot of these but cannot get some of them to work"   O: Stress management group completed to use as productive coping strategy, to help mitigate maladaptive coping to integrate in functional BADL/IADL. Stress management tool worksheet discussed to educate on unhealthy vs healthy coping skills to manage stress to improve community integration. Coping strategies taught include: relaxation based- deep breathing, counting to 10, taking a 1 minute vacation, acceptance, stress balls, relaxation audio/video, visual/mental imagery. Positive mental attitude- gratitude, acceptance, cognitive reframing, positive self talk, anger management. Pt to share 1-2 coping skills they would like to begin to implement and how.  A: Pt presents to group with flat affect, engaged and participatory throughout group  session. Pt in understanding of stress management tools worksheet, sharing that she most commonly suppresses her emotions to the point of acting out and also experiences passivity and some complaining/blaming. After learning skills, pt shares that she has learned a lot of the skills previously and that she has been unable to get them to work. Pt then stating a long list of skills she can try, needing cues to pick one. After cues, pt shares that she will attempt to use positive affirmations throughout day on her phone and by writing them.  P: Pt provided with education on stress management activities. OT will continue to follow up with activities learned for successful implementation into daily life. OT group will be 4x per week.                  OT Education - 05/25/18 1344    Education Details  education given on stress management     Person(s) Educated  Patient    Methods  Explanation;Handout    Comprehension  Verbalized understanding       OT Short Term Goals - 05/25/18 1347      OT SHORT TERM GOAL #1   Title  Pt will be educated on strategies to improve psychosocial skills needed to participate fully in all daily, work, and leisure activities    Time  4    Period  Weeks    Status  On-going  Target Date  06/19/18      OT SHORT TERM GOAL #2   Title  Pt will apply psychosocial skills and coping mechanisms to daily activities in order to function independently and reintegrate into community dwelling    Time  4    Period  Weeks    Status  On-going    Target Date  06/19/18      OT SHORT TERM GOAL #3   Title  Pt will recall and/or apply 1-3 sleep hygiene strategies to improve BADL routine when reintegrating into community    Time  4    Period  Weeks    Status  On-going    Target Date  06/19/18      OT SHORT TERM GOAL #4   Title  Pt will engage in goal setting to improve functional BADL/IADL routine upon reintegrating into community    Time  4    Period  Weeks     Status  On-going    Target Date  06/19/18      OT SHORT TERM GOAL #5   Title  Pt will choose and/or engage in 1-3 socially engaging leisure activities to improve social participation upon re integrating into community    Time  4    Period  Weeks    Status  On-going    Target Date  06/19/18               Plan - 05/25/18 1347    Occupational performance deficits (Please refer to evaluation for details):  ADL's;IADL's;Rest and Sleep;Education;Work;Leisure;Social Participation       Patient will benefit from skilled therapeutic intervention in order to improve the following deficits and impairments:  Decreased coping skills, Decreased psychosocial skills, Other (comment)(decreased ability to engage in BADL and reintegrate into community )  Visit Diagnosis: Organic personality disorder  Difficulty coping    Problem List Patient Active Problem List   Diagnosis Date Noted  . Autism spectrum disorder 05/06/2018  . Anxiety and depression 05/06/2018  . 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 Seizures (HCC) 04/09/2017  . 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

## 2018-05-26 ENCOUNTER — Other Ambulatory Visit (HOSPITAL_COMMUNITY): Payer: No Typology Code available for payment source | Admitting: Occupational Therapy

## 2018-05-26 ENCOUNTER — Other Ambulatory Visit (HOSPITAL_COMMUNITY): Payer: No Typology Code available for payment source | Admitting: Licensed Clinical Social Worker

## 2018-05-26 ENCOUNTER — Ambulatory Visit: Payer: No Typology Code available for payment source | Admitting: Clinical

## 2018-05-26 ENCOUNTER — Encounter: Payer: Self-pay | Admitting: Family Medicine

## 2018-05-26 ENCOUNTER — Encounter: Payer: Self-pay | Admitting: Gastroenterology

## 2018-05-26 ENCOUNTER — Encounter (HOSPITAL_COMMUNITY): Payer: Self-pay | Admitting: Occupational Therapy

## 2018-05-26 ENCOUNTER — Ambulatory Visit (INDEPENDENT_AMBULATORY_CARE_PROVIDER_SITE_OTHER): Payer: No Typology Code available for payment source | Admitting: Gastroenterology

## 2018-05-26 VITALS — BP 86/56 | HR 96 | Ht 62.5 in | Wt 142.2 lb

## 2018-05-26 DIAGNOSIS — R4589 Other symptoms and signs involving emotional state: Secondary | ICD-10-CM

## 2018-05-26 DIAGNOSIS — K219 Gastro-esophageal reflux disease without esophagitis: Secondary | ICD-10-CM

## 2018-05-26 DIAGNOSIS — F3181 Bipolar II disorder: Secondary | ICD-10-CM

## 2018-05-26 DIAGNOSIS — F070 Personality change due to known physiological condition: Secondary | ICD-10-CM

## 2018-05-26 DIAGNOSIS — F840 Autistic disorder: Secondary | ICD-10-CM

## 2018-05-26 DIAGNOSIS — R112 Nausea with vomiting, unspecified: Secondary | ICD-10-CM | POA: Diagnosis not present

## 2018-05-26 MED ORDER — ONDANSETRON 8 MG PO TBDP: 8.0000 mg | ORAL_TABLET | Freq: Two times a day (BID) | ORAL | 3 refills | Status: DC

## 2018-05-26 NOTE — Patient Instructions (Signed)
If you are age 25 or older, your body mass index should be between 23-30. Your Body mass index is 25.6 kg/m. If this is out of the aforementioned range listed, please consider follow up with your Primary Care Provider.  If you are age 38 or younger, your body mass index should be between 19-25. Your Body mass index is 25.6 kg/m. If this is out of the aformentioned range listed, please consider follow up with your Primary Care Provider.   You have been scheduled for a gastric emptying scan at [facility] on Tuesday, 2-25th at 8:30am. Please arrive at least 30 minutes prior to your appointment for registration. Please make certain not to have anything to eat or drink after midnight the night before your test. Hold all stomach medications (ex: Zofran, phenergan, Reglan) 24 hours prior to your test. If you need to reschedule your appointment, please contact radiology scheduling at [phone]. ______________________________________________________________ A gastric-emptying study measures how long it takes for food to move through your stomach. There are several ways to measure stomach emptying. In the most common test, you eat food that contains a small amount of radioactive material. A scanner that detects the movement of the radioactive material is placed over your abdomen to monitor the rate at which food leaves your stomach. This test normally takes about 4 hours to complete. _____________________________________________________________  We have sent the following medications to your pharmacy for you to pick up at your convenience: Zofran 8mg  ODT: Take twice a day  Thank you for entrusting me with your care and for choosing [facility], Dr. Ileene Patrick

## 2018-05-26 NOTE — Therapy (Signed)
[facility] [address] [place], [place], [address] Phone: [phone]   Fax:  [phone]  Occupational Therapy Treatment  Patient Details  Name: Jill Alexander MRN: [id number] Date of Birth: April 18, 1996 Referring Provider (OT): Hillery Jacksanika Lewis, NP   Encounter Date: 05/26/2018  OT End of Session - 05/26/18 1256    Visit Number  3    Number of Visits  16    Date for OT Re-Evaluation  06/19/18    Authorization Type  [place] [employer], [employer]    OT Start Time  1100    OT Stop Time  1200    OT Time Calculation (min)  60 min    Activity Tolerance  Patient tolerated treatment well    Behavior During Therapy  [facility] for tasks assessed/performed       Past Medical History:  Diagnosis Date  . Anxiety   . Autism   . Depression   . GERD (gastroesophageal reflux disease)   . Seizures (HCC)   . 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 NO PAST SURGERIES      There were no vitals filed for this visit.  Subjective Assessment - 05/26/18 1252    Currently in Pain?  No/denies       S: "This was difficult to do but good"  O: Education given on the importance of BADL/IADL routine given. Pt to construct daily BADL/IADL routine to help improve structure, function, and quality of engagement. Pt to list out daily tasks, with focus on trouble areas to help increase accountability and follow through. Educated pt on keeping routine placed in frequently viewed area for increased reminder and consistency. Education also given on apps to use when for routine building.  A: Individual session completed this date. Pt presents with flat affect, engaged and participatory throughout entirety of session. Pt very fixated on time throughout activity, attempting to plan routine in 10-15 minute increments despite cues to make routine relatively fluid. Pt needing max- total step by step cues to  conceptualize and detail schedule. Pt identified areas of showering, laundry, and volunteering as difficulties to maintain in routine. Attempted to highlight importance of maintaining accountability to these areas, pt still stating that engaging in these areas is mood dependent. Continued to educate pt on importance of maintaining routine to best of ability despite mood, if possible. Cued pt to pick specific laundry/shower days to increase compliance. Pt also allotted time for social participation, leisure, and medication management within routine. OT will need to follow up to ensure understanding and follow through from patient.  P: OT group will be 4x per week while pt enrolled in PHP.                    OT Education - 05/26/18 1255    Education Details  educaiton given on building BADL routine    Person(s) Educated  Patient    Methods  Explanation;Handout    Comprehension  Verbalized understanding       OT Short Term Goals - 05/25/18 1347      OT SHORT TERM GOAL #1   Title  Pt will be educated on strategies to improve psychosocial skills needed to participate fully in all daily, work, and leisure activities    Time  4    Period  Weeks    Status  On-going    Target Date  06/19/18  OT SHORT TERM GOAL #2   Title  Pt will apply psychosocial skills and coping mechanisms to daily activities in order to function independently and reintegrate into community dwelling    Time  4    Period  Weeks    Status  On-going    Target Date  06/19/18      OT SHORT TERM GOAL #3   Title  Pt will recall and/or apply 1-3 sleep hygiene strategies to improve BADL routine when reintegrating into community    Time  4    Period  Weeks    Status  On-going    Target Date  06/19/18      OT SHORT TERM GOAL #4   Title  Pt will engage in goal setting to improve functional BADL/IADL routine upon reintegrating into community    Time  4    Period  Weeks    Status  On-going    Target Date   06/19/18      OT SHORT TERM GOAL #5   Title  Pt will choose and/or engage in 1-3 socially engaging leisure activities to improve social participation upon re integrating into community    Time  4    Period  Weeks    Status  On-going    Target Date  06/19/18               Plan - 05/26/18 1257    Occupational performance deficits (Please refer to evaluation for details):  ADL's;IADL's;Rest and Sleep;Education;Work;Leisure;Social Participation       Patient will benefit from skilled therapeutic intervention in order to improve the following deficits and impairments:  Decreased coping skills, Decreased psychosocial skills, Other (comment)(decreased ability to engage in BADL and reintegrate into BADL)  Visit Diagnosis: Organic personality disorder  Difficulty coping    Problem List Patient Active Problem List   Diagnosis Date Noted  . Autism spectrum disorder 05/06/2018  . Anxiety and depression 05/06/2018  . 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 Seizures (HCC) 04/09/2017  . 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

## 2018-05-26 NOTE — Progress Notes (Signed)
HPI :  23 y/o female with a history of autism, depression, seizure disorder, here for a follow up visit for nausea and upper tract symptoms.  She was previously seen for symptoms of reflux and nausea, also upper abdominal discomfort. She takes Lamictal chronically. She's had a workup with negative celiac serologies along with normal IgA level. We placed her on omeprazole 20 mg once a day, she had a negative stool test for H. Pylori prior to starting PPI. We gave her some MiraLAX for constipation. She intervally increased omeprazole to 20mg  BID.  She did have some benefit on the regime but her nausea continued to give her symptoms. EGD was done in December which was normal - including normal biopsies of the esophagus, stomach, and small bowel. She continues to be nauseated fairly frequently. She has vomiting a few times a week, often after she eats. She denies much discomfort in her abdomen which is bothering her. She states she is taking the Prilosec 40 mg twice a day which is working much better for her heartburn seems to be making that symptom better. She has been using Zofran for her nausea as needed which helps, she doesn't take it routinely. She has tried over-the-counter Pepcid, peppermint tea, and Tums which hasn't helped.  She continues to take lamictal for her seizure disorder and on Cariprazine and Selegiline per psych.  EGD 04/20/2018 - normal esophagus, biopsies taken - no evidence of EoE, normal stomach - biopsies show no H pylori, normal duodenum - biopsies show no celiac    Past Medical History:  Diagnosis Date  . Anxiety   . Autism   . Depression   . GERD (gastroesophageal reflux disease)   . Seizures (HCC)   . 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 NO PAST SURGERIES     Family History  Problem Relation Age of Onset  . Depression Father   . Anxiety disorder Sister   . Bipolar disorder Sister   . Diabetes Maternal  Grandfather   . Cancer Maternal Grandfather   . Breast cancer Paternal Grandmother   . Uterine cancer Paternal Grandmother   . Irritable bowel syndrome Paternal Grandmother   . Colon cancer Neg Hx   . Esophageal cancer Neg Hx   . Stomach cancer Neg Hx   . Rectal cancer Neg Hx    Social History   Tobacco Use  . Smoking status: Never Smoker  . Smokeless tobacco: Never Used  Substance Use Topics  . Alcohol use: No  . Drug use: No   Current Outpatient Medications  Medication Sig Dispense Refill  . acetaminophen (TYLENOL) 500 MG tablet Take 500 mg by mouth every 6 (six) hours as needed.    . Cariprazine HCl 4.5 MG CAPS Take 1 capsule (4.5 mg total) by mouth daily. 30 capsule 1  . Ibuprofen (MOTRIN PO) Take 1 tablet by mouth as needed.     . lamoTRIgine (LAMICTAL) 150 MG tablet Take 1 tablet (150 mg total) by mouth 2 (two) times daily. 60 tablet 1  . LORazepam (ATIVAN) 2 MG tablet Take 1 tablet (2 mg total) by mouth 2 (two) times daily. 60 tablet 1  . Multiple Vitamins-Minerals (MULTIVITAMIN PO) Take 1 tablet by mouth daily.    . Norethin Ace-Eth Estrad-FE (TAYTULLA) 1-20 MG-MCG(24) CAPS Take 1 tablet by mouth daily. 28 capsule 12  . omeprazole (PRILOSEC) 40 MG capsule Take 1 capsule (40 mg total) by mouth 2 (two)  times daily. 60 capsule 1  . ondansetron (ZOFRAN ODT) 8 MG disintegrating tablet Take 1 tablet (8 mg total) by mouth every 8 (eight) hours as needed for nausea or vomiting. 60 tablet 3  . polyethylene glycol (MIRALAX) packet Take 17 g by mouth daily as needed. (Patient taking differently: Take 17 g by mouth as needed. ) 14 each 0  . selegiline (EMSAM) 6 MG/24HR Place 1 patch (6 mg total) onto the skin daily. 30 patch 12   No current facility-administered medications for this visit.    No Known Allergies   Review of Systems: All systems reviewed and negative except where noted in HPI.    No results found.  Physical Exam: BP (!) 86/56 (BP Location: Left Arm, Patient  Position: Sitting, Cuff Size: Normal)   Pulse 96   Ht 5' 2.5" (1.588 m) Comment: height measured without shoes  Wt 142 lb 4 oz (64.5 kg)   BMI 25.60 kg/m  Constitutional: Pleasant,well-developed,female in no acute distress. HEENT: Normocephalic and atraumatic. Conjunctivae are normal. No scleral icterus. Neck supple.  Cardiovascular: Normal rate, regular rhythm.  Pulmonary/chest: Effort normal and breath sounds normal. No wheezing, rales or rhonchi. Abdominal: Soft, nondistended, nontender. There are no masses palpable. No hepatomegaly. Extremities: no edema Lymphadenopathy: No cervical adenopathy noted. Neurological: Alert and oriented to person place and time. Skin: Skin is warm and dry. No rashes noted. Psychiatric: Normal mood and affect. Behavior is normal.   ASSESSMENT AND PLAN: 23 year old female here for reassessment of the following issues:  Nausea and vomiting / GERD - dyspepsia and reflux appear to be better on her current regimen of PPI. Her EGD was unremarkable. She continues to have frequent nausea with occasional postprandial vomiting. I discussed differential with her. I think a gastric imaging study is reasonable to ensure no evidence of gastroparesis, however medication side effect from Lamictal also remains possible (can cause chronic nausea in 14% of patients on this drug). She was agreeable to the gastric emptying study. If that is negative, I will discuss with her neurologist to see if we can wean her off Lamictal put her on another regimen and see if that helps her nausea. Otherwise recommend she use her Zofran scheduled at least twice daily to help minimize her symptoms. She agreed with this. Continue PPI as dosed, appears to have helped her reflux and dyspepsia.   Ileene Patrick, MD [facility]

## 2018-05-26 NOTE — Telephone Encounter (Signed)
Pt called and stated that she needed a referral to see her GI Dr. She has the focus plan and they told her she needed a referral before she can be seen

## 2018-05-27 ENCOUNTER — Other Ambulatory Visit (HOSPITAL_COMMUNITY): Payer: No Typology Code available for payment source | Admitting: Licensed Clinical Social Worker

## 2018-05-27 ENCOUNTER — Encounter (HOSPITAL_COMMUNITY): Payer: Self-pay

## 2018-05-27 VITALS — BP 104/66 | HR 91 | Ht 63.0 in | Wt 146.0 lb

## 2018-05-27 DIAGNOSIS — F840 Autistic disorder: Secondary | ICD-10-CM

## 2018-05-27 DIAGNOSIS — F3181 Bipolar II disorder: Secondary | ICD-10-CM | POA: Diagnosis not present

## 2018-05-27 NOTE — Progress Notes (Signed)
Patient presented with flat affect, depressed mood but denied any suicidal or homicidal ideations, no auditory or visual hallucinations and no plans or intent to want to harm self or others.  Patient reported some past history of trying to cut self when she was in 7th or 8th grade but none since then and no other attempts to harm self.  States her primary issues is that "I get anxious often".  Patient rated her current level of depression a 1, anxiety a 7, and hopelessness a 0 on a scale of 0-10 with 0 being none and 10 the worst she could manage.  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  Patient reviewed all medications with this nurse and denied any side effects to them.  States a history of seizures 2 times with most recent seizure in 2015.  Reports ongoing gastrointestinal problems and saw a GI provider on 05/26/18.  Reports she has constipation at times and uses Miralax to help with management.  Denies any other concerns at this time and agreed to inform this nurse or PHP staff if any worsening of symptoms or side effects to medications.

## 2018-05-27 NOTE — Progress Notes (Signed)
GROUP NOTE - spiritual care group 05/27/2018 11:00 - 12:00 ?Facilitated by Wilkie Aye, MDiv, BCC and Counseling Intern, Gus Height.  ? ? Group focused on topic of strength. ?Group members reflected on what thoughts and feelings emerge when they hear this topic. ?They then engaged in facilitated dialog around how strength is present in their lives. This dialog focused on representing what strength had been to them in their lives (images and patterns given) and what they saw as helpful in their life now (what they needed / wanted). ? ? Activity drew on narrative framework    Jill Alexander was present throughout group.  Alert and oriented, engaged in group discussion and activity voluntarily.  Noted her own anxiety around activity and communicated this with group, asked for clarification around activity.   In discussion, identified seeing strength as "independence" and spoke about this as "being able to stand on my own" and "not need help."  In course of discussion, Jill Alexander and facilitators spoke about awareness of balance and appropriate asks for help. Jill Alexander gave situational example of car breaking down in road and calling for help vs. Thinking she should be able to fix it on her own.   Jill Alexander also noted that she can think of strength as "being unemotional" but in group discussion noted that emotions can provide [place] with insight.  Group described strength as making choices that are helpful for [place] in the midst of feeling complex emotions.

## 2018-05-28 ENCOUNTER — Encounter (HOSPITAL_COMMUNITY): Payer: Self-pay

## 2018-05-28 ENCOUNTER — Other Ambulatory Visit (HOSPITAL_COMMUNITY): Payer: No Typology Code available for payment source | Admitting: Occupational Therapy

## 2018-05-28 ENCOUNTER — Other Ambulatory Visit (HOSPITAL_COMMUNITY): Payer: No Typology Code available for payment source | Admitting: Licensed Clinical Social Worker

## 2018-05-28 DIAGNOSIS — R4589 Other symptoms and signs involving emotional state: Secondary | ICD-10-CM

## 2018-05-28 DIAGNOSIS — F3181 Bipolar II disorder: Secondary | ICD-10-CM | POA: Diagnosis not present

## 2018-05-28 DIAGNOSIS — F840 Autistic disorder: Secondary | ICD-10-CM

## 2018-05-28 DIAGNOSIS — F070 Personality change due to known physiological condition: Secondary | ICD-10-CM

## 2018-05-28 NOTE — Therapy (Signed)
[facility] [address] [place], [place], [address] Phone: [phone]   Fax:  [phone]  Occupational Therapy Treatment  Patient Details  Name: Jill BickerMadeline Sarah Ates MRN: [id number] Date of Birth: July 02, 1995 Referring Provider (OT): Hillery Jacksanika Lewis, NP   Encounter Date: 05/28/2018  OT End of Session - 05/28/18 1254    Visit Number  4    Number of Visits  16    Date for OT Re-Evaluation  06/19/18    Authorization Type  [facility] [employer]    OT Start Time  1100    OT Stop Time  1200    OT Time Calculation (min)  60 min    Activity Tolerance  Patient tolerated treatment well    Behavior During Therapy  [facility] for tasks assessed/performed       Past Medical History:  Diagnosis Date  . Anxiety   . Autism   . Depression   . GERD (gastroesophageal reflux disease)   . Seizures (HCC)   . 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 NO PAST SURGERIES      There were no vitals filed for this visit.  Subjective Assessment - 05/28/18 1252    Currently in Pain?  No/denies          S: "I would like to make more friends"   O: Education given on self-accountability being in line with personal values and goals to maintain occupational balance in various community settings. Pt given goal identifying worksheet to list immediate, short term, medium term, and long-term goals using a SMART goal framework (specificity, meaningful, adaptive, realistic, and time bound). Goals created as guideline for pt to practice being accountable in various situations. Pt completed work sheet of goals and encouraged to share goals with the group, with emphasis on immediate goal for check in with pt for next session to maintain accountability.   A: Pt presents to group with flat affect, engaged and participatory throughout entirety of session. Pt shares her values are centered around personal growth,  work, and relationships. She focused her goals around social participation, decreasing her leisure to more productive time, and furthering herself in the workforce by engaging in job training. Pt needing cues to formulate thoughts and grasp ideas, often becomes tangential in trying to explain ideas- needing cues to conceptualize.  P: Pt provided with education on improving accountability. OT will continue to follow up with accountability and goal/value setting skills for successful implementation into daily life.                    OT Education - 05/28/18 1253    Education Details  education given on goal setting    Person(s) Educated  Patient    Methods  Explanation;Handout    Comprehension  Verbalized understanding       OT Short Term Goals - 05/25/18 1347      OT SHORT TERM GOAL #1   Title  Pt will be educated on strategies to improve psychosocial skills needed to participate fully in all daily, work, and leisure activities    Time  4    Period  Weeks    Status  On-going    Target Date  06/19/18      OT SHORT TERM GOAL #2   Title  Pt will apply psychosocial skills and coping mechanisms to daily activities in order to function independently and reintegrate into community dwelling  Time  4    Period  Weeks    Status  On-going    Target Date  06/19/18      OT SHORT TERM GOAL #3   Title  Pt will recall and/or apply 1-3 sleep hygiene strategies to improve BADL routine when reintegrating into community    Time  4    Period  Weeks    Status  On-going    Target Date  06/19/18      OT SHORT TERM GOAL #4   Title  Pt will engage in goal setting to improve functional BADL/IADL routine upon reintegrating into community    Time  4    Period  Weeks    Status  On-going    Target Date  06/19/18      OT SHORT TERM GOAL #5   Title  Pt will choose and/or engage in 1-3 socially engaging leisure activities to improve social participation upon re integrating into community     Time  4    Period  Weeks    Status  On-going    Target Date  06/19/18               Plan - 05/28/18 1254    Occupational performance deficits (Please refer to evaluation for details):  ADL's;IADL's;Rest and Sleep;Education;Work;Leisure;Social Participation       Patient will benefit from skilled therapeutic intervention in order to improve the following deficits and impairments:  Decreased coping skills, Decreased psychosocial skills, Other (comment)(decreased ability to engage in BADL and reintegrate into community)  Visit Diagnosis: Organic personality disorder  Difficulty coping    Problem List Patient Active Problem List   Diagnosis Date Noted  . Autism spectrum disorder 05/06/2018  . Anxiety and depression 05/06/2018  . 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 Seizures (HCC) 04/09/2017  . Tremor, 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   Dalphine HandingKaylee Sherita Decoste, MSOT, OTR/L Behavioral Health OT/ Acute Relief OT PHP Office: [phone]  Dalphine HandingKaylee Unknown Schleyer 05/28/2018, 12:56 PM  [facility] [address] [place], [place], [address] Phone: [phone]   Fax:  [phone]  Name: Jill BickerMadeline Sarah Pirro MRN: [id number] Date of Birth: 1995/11/17

## 2018-05-28 NOTE — Psych (Signed)
   [facility] PHP THERAPIST PROGRESS NOTE  Jill Alexander [id number]  Session Time: 9:00 - 11:00  Participation Level: Active  Behavioral Response: CasualAlertAnxious and Depressed  Type of Therapy: Group Therapy  Treatment Goals addressed: Coping  Interventions: CBT, DBT, Solution Focused, Supportive and Reframing  Summary:  Clinician led check-in regarding current stressors and situation, and review of patient completed daily inventory. Clinician utilized active listening and empathetic response and validated patient emotions. Clinician facilitated processing group on pertinent issues.   Therapist Response: Jill Alexander is a 23 y.o. female who presents with anxiety and depression symptoms. Patient arrived within time allowed and reports that she is feeling "really tired." Patient rates her mood at a 4 on a scale of 1-10 with 10 being great. Pt states that mornings are rough for her and that anxiety is typically high. Pt reports additional stress from being worried about being late. 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 Patient reports struggles with emotional regaulation. Patient engaged in discussion.       Session Time: 11:00 - 12:00   Participation Level: Active   Behavioral Response: CasualAlertDepressed   Type of Therapy: Group Therapy, OT   Treatment Goals addressed: Coping   Interventions: Psychosocial skills training, Supportive,    Summary:  Occupational Therapy group   Therapist Response: Patient engaged in group. See OT note.          Session Time: 12:00 - 12:45  Participation Level: Active  Behavioral Response: CasualAlertDepressed  Type of Therapy: Group Therapy, Activity Therapy  Treatment Goals addressed: Coping  Interventions: Psychologist, occupational,  Supportive  Summary:  Reflection Group: Patients encouraged to practice skills and interpersonal techniques or work on mindfulness and relaxation techniques. The importance of self-care and making skills part of a routine to increase usage were stressed   Therapist Response: Patient engaged and participated appropriately.       Session Time: 12:45- 2:00  Participation Level: Active  Behavioral Response: CasualAlertDepressed  Type of Therapy: Group Therapy, Psychoeducation; Psychotherapy  Treatment Goals addressed: Coping  Interventions: CBT; Solution focused; Supportive; Reframing  Summary: 12:45 - 1:50: Clinician introduced topic of cognitive distortions. Cln educated on what cognitive distortions are and how they affect [place]. Cln introduced "Catch, Challenge, Change" and group began to review cognitive distortion handout and came up with examples to work on "catch." 1:50 -2:00 Clinician led check-out. Clinician assessed for immediate needs, medication compliance and efficacy, and safety concerns   Therapist Response: Patient engaged activity and discussion. Pt was able to identify real life examples of cognitive distortions discussed.  At check-out, patient rates her mood at a 8 on a scale of 1-10 with 10 being great. Patient reports afternoon plans of spending time at home and walk. Patient demonstrates some progress as evidenced by demonstrating insight during topic group. Patient denies SI/HI/self-harm at the end of group.      Suicidal/Homicidal: Nowithout intent/plan    Plan: Pt will continue in PHP while working to decrease anxiety and depression symptoms and increase ability to self-manage symptoms.    Diagnosis: Bipolar 2 disorder (HCC) [F31.81]    1. Bipolar 2 disorder (HCC)       Donia Guiles, LCSW 05/28/2018

## 2018-05-29 ENCOUNTER — Other Ambulatory Visit (HOSPITAL_COMMUNITY): Payer: No Typology Code available for payment source | Admitting: Licensed Clinical Social Worker

## 2018-05-29 ENCOUNTER — Encounter (HOSPITAL_COMMUNITY): Payer: Self-pay | Admitting: Occupational Therapy

## 2018-05-29 ENCOUNTER — Other Ambulatory Visit (HOSPITAL_COMMUNITY): Payer: No Typology Code available for payment source | Admitting: Occupational Therapy

## 2018-05-29 DIAGNOSIS — F0630 Mood disorder due to known physiological condition, unspecified: Secondary | ICD-10-CM

## 2018-05-29 DIAGNOSIS — F3181 Bipolar II disorder: Secondary | ICD-10-CM

## 2018-05-29 DIAGNOSIS — F840 Autistic disorder: Secondary | ICD-10-CM

## 2018-05-29 DIAGNOSIS — R4589 Other symptoms and signs involving emotional state: Secondary | ICD-10-CM

## 2018-05-29 DIAGNOSIS — F070 Personality change due to known physiological condition: Secondary | ICD-10-CM

## 2018-05-29 NOTE — Progress Notes (Signed)
BH MD/PA/NP OP Progress Note  05/30/2018 4:16 PM Jill Alexander  MRN:  [id number]   Evaluation:  Jill Alexander observed attending daily group sessions.  She is awake alert and oriented x3.  Patient presents flat and guarded but pleasant.  Continues to report her mood has been improving since her admission.  Reports "little sadness" as she relates to her not feeling well. (Cold related symptoms) continues to deny suicidal and homicidal ideations.  Denies auditory visual hallucinations.  Reports feeling encouraged about vocational rehabilitation.  She reports she needs to start working soon.  Rates her depression 3 out of 10 with 10 being the most possible.  Patient reports taking and tolerating medications well as she is reports she was recently switched to medication patch (Emsam) for moods stabilization.  Reports a good appetite.  States she is resting "okay" throughout the night. Support encouragement reassurance was provided.   History: Per admission assessment note: Patient is a 23 y.o. Caucasian female presents with worsening depression. Jill Alexander reports mood irritability ability and social isolation related to her depression.  Reports intermittent thoughts of suicidal ideations however denies intent or plan.  Jill Alexander reports a recent medication adjustment by her psychiatrist. States this episode was triggered by stressors related to college classes. ([employer]) denies suicidal or homicidal ideations.  Denies auditory visual hallucinations.     Visit Diagnosis:    ICD-10-CM   1. Mood disorder in conditions classified elsewhere F06.30     Past Psychiatric History:   Past Medical History:  Past Medical History:  Diagnosis Date  . Anxiety   . Autism   . Depression   . GERD (gastroesophageal reflux disease)   . Seizures (HCC)   . 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 NO PAST SURGERIES      Family Psychiatric  History:  Family History:  Family History  Problem Relation Age of Onset  . Depression Father   . Anxiety disorder Sister   . Bipolar disorder Sister   . Diabetes Maternal Grandfather   . Cancer Maternal Grandfather   . Breast cancer Paternal Grandmother   . Uterine cancer Paternal Grandmother   . Irritable bowel syndrome Paternal Grandmother   . Colon cancer Neg Hx   . Esophageal cancer Neg Hx   . Stomach cancer Neg Hx   . Rectal cancer Neg Hx     Social History:  Social History   Socioeconomic History  . Marital status: Single    Spouse name: Not on file  . Number of children: Not on file  . Years of education: Not on file  . Highest education level: Not on file  Occupational History  . Not on file  Social Needs  . Financial resource strain: Not hard at all  . Food insecurity:    Worry: Never true    Inability: Never true  . Transportation needs:    Medical: No    Non-medical: No  Tobacco Use  . Smoking status: Never Smoker  . Smokeless tobacco: Never Used  Substance and Sexual Activity  . Alcohol use: No  . Drug use: No  . Sexual activity: Never  Lifestyle  . Physical activity:    Days per week: 2 days    Minutes per session: 30 min  . Stress: To some extent  Relationships  . Social connections:    Talks on phone: Never    Gets together: More than three times a week  Attends religious service: 1 to 4 times per year    Active member of club or organization: No    Attends meetings of clubs or organizations: Never    Relationship status: Not on file  Other Topics Concern  . Not on file  Social History Narrative   Lives with parents in [place]. Pt moved here from [place] in 2018.   Caffeine use: 2-3 cups per day   Left handed     Allergies: No Known Allergies  Metabolic Disorder Labs: No results found for: HGBA1C, MPG No results found for: PROLACTIN No results found for: CHOL, TRIG, HDL, CHOLHDL, VLDL, LDLCALC Lab Results  Component Value  Date   TSH 1.340 11/17/2017   TSH 0.677 08/21/2017    Therapeutic Level Labs: Lab Results  Component Value Date   LITHIUM 0.8 01/05/2018   LITHIUM 0.5 (L) 12/18/2017   No results found for: VALPROATE No components found for:  CBMZ  Current Medications: Current Outpatient Medications  Medication Sig Dispense Refill  . acetaminophen (TYLENOL) 500 MG tablet Take 500 mg by mouth every 6 (six) hours as needed.    . Cariprazine HCl 4.5 MG CAPS Take 1 capsule (4.5 mg total) by mouth daily. 30 capsule 1  . Ibuprofen (MOTRIN PO) Take 1 tablet by mouth as needed.     . lamoTRIgine (LAMICTAL) 150 MG tablet Take 1 tablet (150 mg total) by mouth 2 (two) times daily. 60 tablet 1  . LORazepam (ATIVAN) 2 MG tablet Take 1 tablet (2 mg total) by mouth 2 (two) times daily. 60 tablet 1  . Multiple Vitamins-Minerals (MULTIVITAMIN PO) Take 1 tablet by mouth daily.    . Norethin Ace-Eth Estrad-FE (TAYTULLA) 1-20 MG-MCG(24) CAPS Take 1 tablet by mouth daily. 28 capsule 12  . omeprazole (PRILOSEC) 40 MG capsule Take 1 capsule (40 mg total) by mouth 2 (two) times daily. 60 capsule 1  . ondansetron (ZOFRAN ODT) 8 MG disintegrating tablet Take 1 tablet (8 mg total) by mouth 2 (two) times daily. 60 tablet 3  . polyethylene glycol (MIRALAX) packet Take 17 g by mouth daily as needed. (Patient taking differently: Take 17 g by mouth as needed. ) 14 each 0  . selegiline (EMSAM) 6 MG/24HR Place 1 patch (6 mg total) onto the skin daily. 30 patch 12   No current facility-administered medications for this visit.      Musculoskeletal: Strength & Muscle Tone: within normal limits Gait & Station: normal Patient leans: N/A  Psychiatric Specialty Exam: ROS  There were no vitals taken for this visit.There is no height or weight on file to calculate BMI.  General Appearance: Casual  Eye Contact:  Fair  Speech:  Clear and Coherent  Volume:  Normal  Mood:  Depressed  Affect:  Congruent  Thought Process:  Coherent   Orientation:  Full (Time, Place, and Person)  Thought Content: Logical   Suicidal Thoughts:  No  Homicidal Thoughts:  No  Memory:  Immediate;   Fair Recent;   Fair Remote;   Fair  Judgement:  Fair  Insight:  Fair  Psychomotor Activity:  Normal  Concentration:  Concentration: Fair  Recall:  [employer] of Knowledge: Fair  Language: Fair  Akathisia:  No  Handed:  Right  AIMS (if indicated):  Assets:  Communication Skills Desire for Improvement Transportation  ADL's:  Intact  Cognition: WNL  Sleep:  Fair   Screenings: GAD-7     Counselor from 05/22/2018 in [facility] PART[facility]PITALIZATION PROGRAM Office  [facility]  Total GAD-7 Score  19  15  14  18     PHQ2-9     Counselor from 05/22/2018 in [facility] PARTIAL HOSPITALIZATION PROGRAM Office [facility] Visit [facility]  PHQ-2 Total Score  5  5  5  6   PHQ-9 Total Score  17  20  17  18        Assessment and Plan:  Continue Partial Hospitalization progaram PHP -Continue medications as prescribed, patient to consider stepping down to IOP intensive outpatient programming.  Treatment plan was reviewed and agreed upon by N.P T.Lewis and patient Jill Alexander need for continued group service   Oneta Rackanika N Lewis, NP 05/30/2018, 4:16 PM

## 2018-05-29 NOTE — Therapy (Signed)
[facility] PARTIAL HOSPITALIZATION PROGRAM [address] [place], [place], [address] Phone: [phone]   Fax:  [phone]  Occupational Therapy Treatment  Patient Details  Name: Symiah Socarras MRN: [id number] Date of Birth: [birth year] Referring Provider (OT): Hillery Jacks, NP   Encounter Date: 05/29/2018  OT End of Session - 05/29/18 1224    Visit Number  5    Number of Visits  16    Date for OT Re-Evaluation  06/19/18    Authorization Type  [place] [employer]    OT Start Time  1100    OT Stop Time  1200    OT Time Calculation (min)  60 min    Activity Tolerance  Patient tolerated treatment well    Behavior During Therapy  [facility] for tasks assessed/performed       Past Medical History:  Diagnosis Date  . Anxiety   . Autism   . Depression   . GERD (gastroesophageal reflux disease)   . Seizures (HCC)   . 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 NO PAST SURGERIES      There were no vitals filed for this visit.  Subjective Assessment - 05/29/18 1223    Currently in Pain?  No/denies        S: "I feel as if kindness is important in most situations"   O: Education given on the importance of utilizing personal strengths to the advantage in life. Pt to circle from a list of strengths they believe to possess, with the opportunity to write their own strengths. Pt then to discuss how strengths have been an advantage in past situations, and how different strengths can be of benefit in the future. This past vs future model was applied to explore relationships, professional life, and personal fulfillment. Pt encouraged to share throughout session and reflect with other group members.   A: Pt presents to group with flat affect, engaged and participatory throughout entirety of session. Pt shares that a few of her strengths are "kindness, articulate, and open mindedness". She shares that kindness  applies in most situations. Pt needing max VC's to describe work related strengths, pt fixated on fact she does not have job. Cues also given for pt to appropriately conceptualize personal fulfillment, stating she feels as if she is not independent but would like to work on being so.   P: OT will continue to follow up to ensure knowledge of strengths is applied affectively when developing psychosocial skills for community reintegration.                    OT Education - 05/29/18 1224    Education Details  education given on self esteem    Person(s) Educated  Patient    Methods  Explanation;Handout    Comprehension  Verbalized understanding       OT Short Term Goals - 05/25/18 1347      OT SHORT TERM GOAL #1   Title  Pt will be educated on strategies to improve psychosocial skills needed to participate fully in all daily, work, and leisure activities    Time  4    Period  Weeks    Status  On-going    Target Date  06/19/18      OT SHORT TERM GOAL #2   Title  Pt will apply psychosocial skills and coping mechanisms to daily activities in order to function independently and reintegrate into  community dwelling    Time  4    Period  Weeks    Status  On-going    Target Date  06/19/18      OT SHORT TERM GOAL #3   Title  Pt will recall and/or apply 1-3 sleep hygiene strategies to improve BADL routine when reintegrating into community    Time  4    Period  Weeks    Status  On-going    Target Date  06/19/18      OT SHORT TERM GOAL #4   Title  Pt will engage in goal setting to improve functional BADL/IADL routine upon reintegrating into community    Time  4    Period  Weeks    Status  On-going    Target Date  06/19/18      OT SHORT TERM GOAL #5   Title  Pt will choose and/or engage in 1-3 socially engaging leisure activities to improve social participation upon re integrating into community    Time  4    Period  Weeks    Status  On-going    Target Date  06/19/18                Plan - 05/29/18 1224    Occupational performance deficits (Please refer to evaluation for details):  ADL's;IADL's;Rest and Sleep;Education;Work;Leisure;Social Participation       Patient will benefit from skilled therapeutic intervention in order to improve the following deficits and impairments:  Decreased coping skills, Decreased psychosocial skills, Other (comment)(decreased ability to engage in BADL and reintegrate into community)  Visit Diagnosis: Organic personality disorder  Difficulty coping    Problem List Patient Active Problem List   Diagnosis Date Noted  . Autism spectrum disorder 05/06/2018  . Anxiety and depression 05/06/2018  . 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 Seizures (HCC) 04/09/2017  . 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

## 2018-05-30 ENCOUNTER — Encounter (HOSPITAL_COMMUNITY): Payer: Self-pay | Admitting: Family

## 2018-06-01 ENCOUNTER — Other Ambulatory Visit (HOSPITAL_COMMUNITY)
Payer: No Typology Code available for payment source | Attending: Psychiatry | Admitting: Licensed Clinical Social Worker

## 2018-06-01 ENCOUNTER — Other Ambulatory Visit (HOSPITAL_COMMUNITY): Payer: No Typology Code available for payment source | Admitting: Occupational Therapy

## 2018-06-01 ENCOUNTER — Encounter (HOSPITAL_COMMUNITY): Payer: Self-pay | Admitting: Occupational Therapy

## 2018-06-01 DIAGNOSIS — R569 Unspecified convulsions: Secondary | ICD-10-CM | POA: Insufficient documentation

## 2018-06-01 DIAGNOSIS — Z818 Family history of other mental and behavioral disorders: Secondary | ICD-10-CM | POA: Insufficient documentation

## 2018-06-01 DIAGNOSIS — F3181 Bipolar II disorder: Secondary | ICD-10-CM | POA: Insufficient documentation

## 2018-06-01 DIAGNOSIS — F840 Autistic disorder: Secondary | ICD-10-CM | POA: Diagnosis not present

## 2018-06-01 DIAGNOSIS — Z8059 Family history of malignant neoplasm of other urinary tract organ: Secondary | ICD-10-CM | POA: Insufficient documentation

## 2018-06-01 DIAGNOSIS — Z79899 Other long term (current) drug therapy: Secondary | ICD-10-CM | POA: Diagnosis not present

## 2018-06-01 DIAGNOSIS — R45851 Suicidal ideations: Secondary | ICD-10-CM | POA: Diagnosis not present

## 2018-06-01 DIAGNOSIS — K219 Gastro-esophageal reflux disease without esophagitis: Secondary | ICD-10-CM | POA: Insufficient documentation

## 2018-06-01 DIAGNOSIS — Z833 Family history of diabetes mellitus: Secondary | ICD-10-CM | POA: Diagnosis not present

## 2018-06-01 DIAGNOSIS — F070 Personality change due to known physiological condition: Secondary | ICD-10-CM

## 2018-06-01 DIAGNOSIS — Z803 Family history of malignant neoplasm of breast: Secondary | ICD-10-CM | POA: Diagnosis not present

## 2018-06-01 DIAGNOSIS — F410 Panic disorder [episodic paroxysmal anxiety] without agoraphobia: Secondary | ICD-10-CM | POA: Insufficient documentation

## 2018-06-01 DIAGNOSIS — R4589 Other symptoms and signs involving emotional state: Secondary | ICD-10-CM

## 2018-06-01 NOTE — Therapy (Signed)
[facility] PART[facility] HOSPITAL[facility] PROGRAM [address] [place], [place], [address] Phone: [phone]   Fax:  [phone]  Occupational Therapy Treatment  Patient Details  Name: [name] MRN: [id number] Date of Birth: June 17, 1995 Referring Provider (OT): Hillery Jacksanika Lewis, NP   Encounter Date: 06/01/2018  OT End of Session - 06/01/18 1323    Visit Number  6    Number of Visits  16    Date for OT Re-Evaluation  06/19/18    Authorization Type  [place] [employer], [employer]    OT Start Time  1100    OT Stop Time  1200    OT Time Calculation (min)  60 min    Activity Tolerance  Patient tolerated treatment well    Behavior During Therapy  [facility] for tasks assessed/performed       Past Medical History:  Diagnosis Date  . Anxiety   . Autism   . Depression   . GERD (gastroesophageal reflux disease)   . Seizures (HCC)   . 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 NO PAST SURGERIES      There were no vitals filed for this visit.  Subjective Assessment - 06/01/18 1323    Currently in Pain?  No/denies       S: "I can sometimes sleep too much"    O: Pt educated on sleep hygiene as it pertains to daily life/routines this date. Education given on appropriate sleep routines, sleep disorders, detriments of too much/too little sleep with encouraged feedback of personal Experiences. Sleep hygiene handout administered to help increase pt understanding. Sleep diary handout given to challenge pt to track current habits and identify area for change. Further education given on relaxation techniques to implement before bed. Pt asked to identify one STG in relation to sleep hygiene to create better daily sleep habits.    A: Pt presents with flat affect, engaged and participatory throughout session. Pt reports not feeling well this date, due to a cold. Pt shares that she can occasionally have the tendency to over sleep and  not follow a good sleep routine. She made a goal of "sleeping only when sleepy" to help maintain consistency in her sleep hygiene. Pt also agreeable to rearranging her environment to only have the bed used for sleep only. Pt continuing not to feel well and leaving group after this session to go home and rest.  P: Pt provided with skills to increase sleep hygiene habits into daily routine. OT will continue to follow up with communication skills for successful implementation into daily life.                    OT Education - 06/01/18 1323    Education Details  education given on sleep hygiene    Person(s) Educated  Patient    Methods  Explanation;Handout    Comprehension  Verbalized understanding       OT Short Term Goals - 05/25/18 1347      OT SHORT TERM GOAL #1   Title  Pt will be educated on strategies to improve psychosocial skills needed to participate fully in all daily, work, and leisure activities    Time  4    Period  Weeks    Status  On-going    Target Date  06/19/18      OT SHORT TERM GOAL #2   Title  Pt will apply psychosocial skills and coping mechanisms  to daily activities in order to function independently and reintegrate into community dwelling    Time  4    Period  Weeks    Status  On-going    Target Date  06/19/18      OT SHORT TERM GOAL #3   Title  Pt will recall and/or apply 1-3 sleep hygiene strategies to improve BADL routine when reintegrating into community    Time  4    Period  Weeks    Status  On-going    Target Date  06/19/18      OT SHORT TERM GOAL #4   Title  Pt will engage in goal setting to improve functional BADL/IADL routine upon reintegrating into community    Time  4    Period  Weeks    Status  On-going    Target Date  06/19/18      OT SHORT TERM GOAL #5   Title  Pt will choose and/or engage in 1-3 socially engaging leisure activities to improve social participation upon re integrating into community    Time  4    Period   Weeks    Status  On-going    Target Date  06/19/18               Plan - 06/01/18 1323    Occupational performance deficits (Please refer to evaluation for details):  ADL's;IADL's;Rest and Sleep;Education;Work;Leisure;Social Participation       Patient will benefit from skilled therapeutic intervention in order to improve the following deficits and impairments:  Decreased coping skills, Decreased psychosocial skills, Other (comment)(decreased ability to engage in BADL and reintegrate into community)  Visit Diagnosis: Organic personality disorder  Difficulty coping    Problem List Patient Active Problem List   Diagnosis Date Noted  . Autism spectrum disorder 05/06/2018  . Anxiety and depression 05/06/2018  . 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 Seizures (HCC) 04/09/2017  . Tremor, 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   Dalphine Handing, MSOT, OTR/L Behavioral Health OT/ Acute Relief OT PHP Office: [phone]  Dalphine Handing 06/01/2018, 1:31 PM  [facility] HOSPITALIZATION PROGRAM [address] [place], [place], [address] Phone: [phone]   Fax:  [phone]  Name: Jill Alexander MRN: [id number] Date of Birth: March 03, 1996

## 2018-06-02 ENCOUNTER — Other Ambulatory Visit (HOSPITAL_COMMUNITY): Payer: No Typology Code available for payment source | Admitting: Professional

## 2018-06-02 ENCOUNTER — Other Ambulatory Visit (HOSPITAL_COMMUNITY): Payer: No Typology Code available for payment source | Admitting: Occupational Therapy

## 2018-06-02 ENCOUNTER — Encounter (HOSPITAL_COMMUNITY): Payer: Self-pay

## 2018-06-02 DIAGNOSIS — F410 Panic disorder [episodic paroxysmal anxiety] without agoraphobia: Secondary | ICD-10-CM | POA: Insufficient documentation

## 2018-06-02 DIAGNOSIS — F3181 Bipolar II disorder: Secondary | ICD-10-CM | POA: Diagnosis not present

## 2018-06-02 DIAGNOSIS — F070 Personality change due to known physiological condition: Secondary | ICD-10-CM

## 2018-06-02 DIAGNOSIS — F840 Autistic disorder: Secondary | ICD-10-CM

## 2018-06-02 DIAGNOSIS — R4589 Other symptoms and signs involving emotional state: Secondary | ICD-10-CM

## 2018-06-02 NOTE — Psych (Signed)
[facility] THERAP[facility]  Jill Alexander [id number]  Session Time: 9:00 - 10:30  Participation Level: Active  Behavioral Response: CasualAlertAnxious and Depressed  Type of Therapy: Group Therapy  Treatment Goals addressed: Coping  Interventions: CBT, DBT, Solution Focused, Supportive and Reframing  Summary:  Clinician led check-in regarding current stressors and situation, and review of patient completed daily inventory. Clinician utilized active listening and empathetic response and validated patient emotions. Clinician facilitated processing group on pertinent issues.   Therapist Response: Jill Alexander is a 23 y.o. female who presents with anxiety and depression symptoms. Patient arrived within time allowed and reports that she is feeling "anxious." Patient rates her mood at a 2.5 on a scale of 1-10 with 10 being great. Pt reports she continues to fight a cold and that has made her feel "worse over all." Pt states she spent yesterday resting and that it helped some with her cold. Pt states feeling anxious about discharge because of it being an "unknown." Pt is able to process. Patient engaged in discussion.     Session Time: 10:30 - 11:15  Participation Level: Active  Behavioral Response: CasualAlertAnxious and Depressed  Type of Therapy: Individual Therapy  Treatment Goals addressed: Coping  Interventions: CBT, DBT, Solution Focused, Supportive and Reframing  Summary:  Cln led discussion on barriers and how to overcome them. Patient identified barriers to achieving her goals and worked to determine ways she can manage the barriers.   Therapist Response: Pt states she has a number of barriers to achieving her goals including negative thinking, difficulty in social situations, feeling guilty, shutting down, defiance and not being independent. Pt demonstrates insight when discussing her barriers and resistance when the conversation turns to solutions.  Pt is able to pick 2 cognitive distortion challenges to use when she has negative thinking. Pt states she is about to begin a trial period with a new support mentor who may help with the social situations and independence issue. Pt states she can try taking baby steps to overcome shutting down and increasing independence.       Session Time: 11:15 - 12:00   Participation Level: Active   Behavioral Response: CasualAlertDepressed   Type of Therapy: Individual Therapy, OT   Treatment Goals addressed: Coping   Interventions: Psychosocial skills training, Supportive,    Summary:  Occupational Therapy group   Therapist Response: Patient engaged in group. See OT note.       Session Time: 12:00- 1:00  Participation Level: Active  Behavioral Response: CasualAlertDepressed  Type of Therapy: Group Therapy, Psychoeducation; Psychotherapy  Treatment Goals addressed: Coping  Interventions: CBT; Solution focused; Supportive; Reframing  Summary: 12:00 - 12:50: Clinician led coping skills review. Group played coping skills UNO in which they identified triggers, struggles, coping skills, strengths, and successes.  1:50 -2:00 Clinician led check-out. Clinician assessed for immediate needs, medication compliance and efficacy, and safety concerns   Therapist Response: Patient engaged in activity. Pt identified triggers of being told she is "lucky" as it feels invalidating and social media as it inspires comparisons. Pt successfully reframes both triggers. Pt reports struggles with keeping a routine; coping skills as listening to pod casts and exercise; strengths of perseverance; and success of doing treatment.   At check-out, patient rates her mood at a 1 on a scale of 1-10 with 10 being great. Pt reports struggling with discharge because "I'm not good with change." Patient reports afternoon plans of getting her nails trimmed and resting.  Patient  demonstrates some progress as evidenced by  increased ability to share in group and quicker recall of skills. Patient denies SI/HI/self-harm at the end of group.    Suicidal/Homicidal: Nowithout intent/plan    Plan: Pt will discharge from PHP due to meeting treatment goals of increased ability to manage stressors and decreased depression and anxiety. Pt will step down to IOP within this agency for continued progress and stability. Pt and provider are aligned with discharge. Pt will begin IOP 06/03/2018. Pt denies SI/HI/psychosis at time of discharge.    Diagnosis: Bipolar 2 disorder (HCC) [F31.81]    1. Bipolar 2 disorder (HCC)   2. Panic attacks   3. Autism spectrum disorder       Donia Guiles, LCSW 06/02/2018

## 2018-06-02 NOTE — Progress Notes (Signed)
BH MD/PA/NP OP Progress Note  06/02/2018 11:43 AM Eniya Cazier  MRN:  [id number]  Chief Complaint: "Some depression". HPI: 23 yo patient currently attending Partial Hospitalization Program and will step down to Intensive Outpatient Program tomorrow. She has been diagnosed with Bipolar 2 disorder, panic disorder and autism spectrum disorder. She has a hx of seizures and in the past was prescribed Keppra (now on lamotrigine). Detailed H&P found in the note by Dr. Lemar Lofty from 05/21/2018 was reviewed. No changes noted except that patient has been started on Emsam 6 mg patch and cariprazine. Patient is tolerating her medications well, believes she has made some progress, denies having suicidal thoughts, her appetite and sleep are good and she feels ready to complete program tomorrow. She is a vegetarian, attends [employer] and believes that increased depression/irritability and intermittent suicidal thoughts were related to stress related to college requirements.  Visit Diagnosis:    ICD-10-CM   1. Bipolar 2 disorder (HCC) F31.81   2. Panic attacks F41.0   3. Autism spectrum disorder F84.0     Past Psychiatric History: reviewed, no changes from H&P documented on 05/21/18  Past Medical History:  Past Medical History:  Diagnosis Date  . Anxiety   . Autism   . Depression   . GERD (gastroesophageal reflux disease)   . Seizures (HCC)   . 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 NO PAST SURGERIES      Family Psychiatric History: reviewed, see below  Family History:  Family History  Problem Relation Age of Onset  . Depression Father   . Anxiety disorder Sister   . Bipolar disorder Sister   . Diabetes Maternal Grandfather   . Cancer Maternal Grandfather   . Breast cancer Paternal Grandmother   . Uterine cancer Paternal Grandmother   . Irritable bowel syndrome Paternal Grandmother   . Colon cancer Neg Hx   . Esophageal cancer Neg Hx    . Stomach cancer Neg Hx   . Rectal cancer Neg Hx     Social History:  Social History   Socioeconomic History  . Marital status: Single    Spouse name: Not on file  . Number of children: Not on file  . Years of education: Not on file  . Highest education level: Not on file  Occupational History  . Not on file  Social Needs  . Financial resource strain: Not hard at all  . Food insecurity:    Worry: Never true    Inability: Never true  . Transportation needs:    Medical: No    Non-medical: No  Tobacco Use  . Smoking status: Never Smoker  . Smokeless tobacco: Never Used  Substance and Sexual Activity  . Alcohol use: No  . Drug use: No  . Sexual activity: Never  Lifestyle  . Physical activity:    Days per week: 2 days    Minutes per session: 30 min  . Stress: To some extent  Relationships  . Social connections:    Talks on phone: Never    Gets together: More than three times a week    Attends religious service: 1 to 4 times per year    Active member of club or organization: No    Attends meetings of clubs or organizations: Never    Relationship status: Not on file  Other Topics Concern  . Not on file  Social History Narrative   Lives with parents in [place].  Pt moved here from [place] in 2018.   Caffeine use: 2-3 cups per day   Left handed     Allergies: No Known Allergies  Metabolic Disorder Labs: No results found for: HGBA1C, MPG No results found for: PROLACTIN No results found for: CHOL, TRIG, HDL, CHOLHDL, VLDL, LDLCALC Lab Results  Component Value Date   TSH 1.340 11/17/2017   TSH 0.677 08/21/2017    Therapeutic Level Labs: Lab Results  Component Value Date   LITHIUM 0.8 01/05/2018   LITHIUM 0.5 (L) 12/18/2017   No results found for: VALPROATE No components found for:  CBMZ  Current Medications: Current Outpatient Medications  Medication Sig Dispense Refill  . acetaminophen (TYLENOL) 500 MG tablet Take 500 mg by mouth every 6 (six)  hours as needed.    . Cariprazine HCl 4.5 MG CAPS Take 1 capsule (4.5 mg total) by mouth daily. 30 capsule 1  . Ibuprofen (MOTRIN PO) Take 1 tablet by mouth as needed.     . lamoTRIgine (LAMICTAL) 150 MG tablet Take 1 tablet (150 mg total) by mouth 2 (two) times daily. 60 tablet 1  . LORazepam (ATIVAN) 2 MG tablet Take 1 tablet (2 mg total) by mouth 2 (two) times daily. 60 tablet 1  . Multiple Vitamins-Minerals (MULTIVITAMIN PO) Take 1 tablet by mouth daily.    . Norethin Ace-Eth Estrad-FE (TAYTULLA) 1-20 MG-MCG(24) CAPS Take 1 tablet by mouth daily. 28 capsule 12  . omeprazole (PRILOSEC) 40 MG capsule Take 1 capsule (40 mg total) by mouth 2 (two) times daily. 60 capsule 1  . ondansetron (ZOFRAN ODT) 8 MG disintegrating tablet Take 1 tablet (8 mg total) by mouth 2 (two) times daily. 60 tablet 3  . polyethylene glycol (MIRALAX) packet Take 17 g by mouth daily as needed. (Patient taking differently: Take 17 g by mouth as needed. ) 14 each 0  . selegiline (EMSAM) 6 MG/24HR Place 1 patch (6 mg total) onto the skin daily. 30 patch 12   No current facility-administered medications for this visit.      Musculoskeletal: Strength & Muscle Tone: within normal limits Gait & Station: normal Patient leans: N/A  Psychiatric Specialty Exam: ROS  There were no vitals taken for this visit.There is no height or weight on file to calculate BMI.  General Appearance: Casual  Eye Contact:  Fair  Speech:  Clear and Coherent  Volume:  Normal  Mood:  Depressed  Affect:  Congruent  Thought Process:  Coherent and Goal Directed  Orientation:  Full (Time, Place, and Person)  Thought Content: Logical   Suicidal Thoughts:  No  Homicidal Thoughts:  No  Memory:  Immediate;   Fair Recent;   Good Remote;   Good  Judgement:  Fair  Insight:  Fair  Psychomotor Activity:  Normal  Concentration:  Concentration: Fair and Attention Span: Fair  Recall:  Good  Fund of Knowledge: Good  Language: Good  Akathisia:   Negative  Handed:  Right  AIMS (if indicated): not done  Assets:  Communication Skills Desire for Improvement Transportation  ADL's:  Intact  Cognition: WNL  Sleep:  Fair   Screenings: GAD-7     Counselor from 05/22/2018 in BEHAVIORAL HEALTH PARTIAL HOSPITALIZATION PROGRAM [facility]  Total GAD-7 Score  19  15  14  18     PHQ2-9  Counselor from 05/22/2018 in BEHAVIORAL HEALTH PARTIAL HOSPITALIZATION PROGRAM [facility]  PHQ-2 Total Score  5  5  5  6   PHQ-9 Total Score  17  20  17  18        Assessment and Plan: Continue partial hospitalization program - about to complete and start IOP tomorrow. Continue current medications with no changes. Plan reviewed with patient who agrees with continuation of treatment in IOP.    Magdalene Patricialgierd A Theodoro Koval, MD 06/02/2018, 11:43 AM

## 2018-06-02 NOTE — Therapy (Signed)
[facility] [place], [place], [address] Phone: [phone]   Fax:  [phone]  Occupational Therapy Treatment  Patient Details  Name: Jill Alexander MRN: [id number] Date of Birth: 18-Feb-1996 Referring Provider (OT): Ricky Ala, NP   Encounter Date: 06/02/2018  OT End of Session - 06/02/18 1313    Visit Number  7    Number of Visits  16    Date for OT Re-Evaluation  06/19/18    Authorization Type  [employer]    OT Start Time  1100    OT Stop Time  1200    OT Time Calculation (min)  60 min    Activity Tolerance  Patient tolerated treatment well    Behavior During Therapy  [facility] for tasks assessed/performed       Past Medical History:  Diagnosis Date  . Anxiety   . Autism   . Depression   . GERD (gastroesophageal reflux disease)   . Seizures ([place])   . 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 NO PAST SURGERIES      There were no vitals filed for this visit.  Subjective Assessment - 06/02/18 1311    Currently in Pain?  No/denies       S: "I need to improve personal hygiene"    O: Education given on self care and its importance in regular BADL/IADL routine. Pt completed self care assessment to identify areas of strength and weakness. Self care assessments covered areas of physical health, psychological health, spiritual health, and professional health. Pt asked to identifies area of weakness within each area and develop plans for improvement this date. Pt encouraged to brainstorm with other peers to begin goal setting in areas of desired change. Pt asked to fill out self care plan to help continue implementation into routine.  A: Pt presents to treatment with flat/anxious affect, engaged and participatory throughout entirety of session. Pt completed self care assessment, stating that she has most to improve in social self care, physical  self care, and emotional self care. Specifically, pt shares that personal hygiene is what she would like to focus on. She also states that increasing friendships and journaling more are her focuses as of right now (in relation to social self care and psychological self care). With max VCs, pt filling out self care plan appropriately.    P: OT will follow up for successful implementation into daily life.                      OT Education - 06/02/18 1313    Education Details  education given on self care routines    Person(s) Educated  Patient    Methods  Explanation;Handout    Comprehension  Verbalized understanding       OT Short Term Goals - 06/02/18 1316      OT SHORT TERM GOAL #1   Title  Pt will be educated on strategies to improve psychosocial skills needed to participate fully in all daily, work, and leisure activities    Time  4    Period  Weeks    Status  Achieved    Target Date  06/19/18      OT SHORT TERM GOAL #2   Title  Pt will apply psychosocial skills and coping mechanisms to daily activities in order to function independently and reintegrate into community dwelling  Time  4    Period  Weeks    Status  Achieved    Target Date  06/19/18      OT SHORT TERM GOAL #3   Title  Pt will recall and/or apply 1-3 sleep hygiene strategies to improve BADL routine when reintegrating into community    Time  4    Period  Weeks    Status  Achieved    Target Date  06/19/18      OT SHORT TERM GOAL #4   Title  Pt will engage in goal setting to improve functional BADL/IADL routine upon reintegrating into community    Time  4    Period  Weeks    Status  Achieved    Target Date  06/19/18      OT SHORT TERM GOAL #5   Title  Pt will choose and/or engage in 1-3 socially engaging leisure activities to improve social participation upon re integrating into community    Time  4    Period  Weeks    Status  Achieved    Target Date  06/19/18                Plan - 06/02/18 1315    Occupational performance deficits (Please refer to evaluation for details):  ADL's;IADL's;Rest and Sleep;Education;Work;Leisure;Social Participation       Patient will benefit from skilled therapeutic intervention in order to improve the following deficits and impairments:  Decreased coping skills, Decreased psychosocial skills, Other (comment)(decreased ability to engage in BADL and reintegrate into community)  Visit Diagnosis: Organic personality disorder  Difficulty coping    Problem List Patient Active Problem List   Diagnosis Date Noted  . Bipolar 2 disorder ([place]) 06/02/2018  . Panic attacks 06/02/2018  . Autism spectrum disorder 05/06/2018  . Anxiety and depression 05/06/2018  . 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 Seizures ([place]) 04/09/2017  . Tremor, 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    OCCUPATIONAL THERAPY DISCHARGE SUMMARY  Visits from Start of Care: 7  Current functional level related to goals / functional outcomes: Pt is stepping down to IOP level of care. Pt agreed and listened to d/c plan given by OT.    Remaining deficits: To continue implementing coping skills, goal setting, sleep hygiene, and routine.   Education / Equipment: Education given on psychosocial and coping mechanisms as it applies to BADL/IADL routine upon reintegrating into community Plan: Patient agrees to discharge.  Patient goals were met. Patient is being discharged due to meeting the stated rehab goals.  ?????        Zenovia Jarred, MSOT, OTR/L Behavioral Health OT/ Acute Relief OT PHP Office: [place] 06/02/2018, 1:19 PM  [facility] HOSPITALIZATION PROGRAM [place], [place], [address] Phone: [phone]   Fax:  [phone]  Name: Jill Alexander MRN: [id number] Date of Birth: [birth year]

## 2018-06-03 ENCOUNTER — Other Ambulatory Visit (HOSPITAL_COMMUNITY): Payer: Self-pay

## 2018-06-03 NOTE — Psych (Signed)
[facility] PHP THERAPIST PROGRESS NOTE  Mickell Vandewalle [id number]  Session Time: 9:00 - 11:00  Participation Level: Active  Behavioral Response: CasualAlertAnxious and Depressed  Type of Therapy: Individual Therapy  Treatment Goals addressed: Coping  Interventions: CBT, DBT, Solution Focused, Supportive and Reframing  Summary:  Clinician led check-in regarding current stressors and situation, and review of patient completed daily inventory. Clinician utilized active listening and empathetic response and validated patient emotions. Clinician facilitated processing group on pertinent issues.   Therapist Response: Jill Alexander is a 23 y.o. female who presents with anxiety and depression symptoms. Patient arrived within time allowed and reports that "I am feeling anxious this morning". Patient rates her mood at a 2.5 on a scale of 1-10 with 10 being great. Pt states that she went to her doctor's appointment yesterday and it went well, but they told her that she needed to call the insurance company for a referral. Pt. reports that she was anxious about calling the insurance company. Pt. states "I was feeling upset, but not sure why and I had a bit of a melt-down", once she got home. Pt. reports taking Ativan for anxiety and using a weighted blanket, scented pillow, listening to a podcast as coping skills. Pt states once she felt better she was able to call insurance company and successfully spoke with someone about the referral. Pt discussed different coping strategies to manage anxiety and depression. Pt. named top five strategies "[employer]" that she would use when she is feeling intense feelings. Pt. states that her top five strategies are: weighted blanket, drinking Sleepy Time tea, listening to favorite podcasts, scented pillow and reading books on her Kindle. Pt. states listening to podcasts is a good coping strategy when she is in public, and spoke about some  of her favorite podcasts. Pt. stated that one podcast is a peer support mental health podcast, when she listens to it, she doesn't feel so isolated. Patient engaged in discussion.         Session Time: 11:00 - 12:15   Participation Level: Active   Behavioral Response: CasualAlertDepressedAnxious   Type of Therapy: individual   Treatment Goals addressed: Coping   Interventions: Strengths based, reframing, Supportive   Summary:  Spiritual Care discussion   Therapist Response: Patient engaged in group. See chaplain note.         Session Time: 12:15 - 1:00   Participation Level: Active   Behavioral Response: CasualAlertDepressed   Type of Therapy: Individual Therapy   Treatment Goals addressed: Coping   Interventions: Psychologist, occupational, Supportive   Summary: Reflection group: Patient encouraged to practice skills and interpersonal techniques or work on mindfulness and relaxation techniques. The importance of self-care and making skills part of a routine to increase usage were stressed   Therapist Response: Patient engaged and participated appropriately.        Session Time: 1:00- 2:00   Participation Level: Active   Behavioral Response: CasualAlertDepressed   Type of Therapy: Individual Therapy, Psychoeducation   Treatment Goals addressed: Coping   Interventions: relaxation training; Supportive; Reframing   Summary: 12:45 - 1:50: Relaxation training: Cln led activity focused on retraining the body's response to stress.  1:50 -2:00 Clinician led check-out. Clinician assessed for immediate needs, medication compliance and efficacy, and safety concerns   Therapist Response: Patient engaged in activity and discussion.  At check-out, patient rates her mood at a 2.5 on a scale of 1-10 with 10 being great. Patient reports that  she is not sure of afternoon plans and will go to sleep early. Patient demonstrates some progress as evidenced by continued use of  coping skills to manage symptoms. Patient denies SI/HI/self-harm thoughts at the end of group.      Suicidal/Homicidal: Nowithout intent/plan    Plan: Pt will continue in PHP while working to decrease anxiety and depression symptoms and increase ability to self-manage symptoms.    Diagnosis: Bipolar 2 disorder (HCC) [F31.81]    1. Bipolar 2 disorder (HCC)   2. Autism spectrum disorder       Donia Guiles, LCSW 06/03/2018

## 2018-06-03 NOTE — Psych (Signed)
   [facility] PHP THERAPIST PROGRESS NOTE  Jill Alexander [id number]  Session Time: 9:00 - 10:15  Participation Level: Active  Behavioral Response: CasualAlertAnxious and Depressed  Type of Therapy: Group Therapy  Treatment Goals addressed: Coping  Interventions: CBT, DBT, Solution Focused, Strength-based and Reframing  Summary: Clinician led check-in regarding current stressors and situation, and review of patient completed daily inventory. Clinician utilized active listening and empathetic response and validated patient emotions. Clinician facilitated processing group on pertinent issues.   Therapist Response:  Jill Alexander is a 23 y.o. female who presents with anxiety and depression symptoms. Patient arrived within time allowed and reports that she is feeling "like she has hope to get better". Patient rates her mood at a 7 on a scale of 1-10 with 10 being great. Pt reports that she "hopes that PHP will help with coping" and symptoms of anxiety and depression. Pt. states that she went to see psychiatrist yesterday morning and then went to get coffee and a doughnut. Pt. states that she tends to not feel well after a meal, regardless of what she is eats. Patient struggles with sleeping well regularly.   Session Time: 10:15 -11:00  Participation Level: Active  Behavioral Response: CasualAlertDepressed  Type of Therapy: Group Therapy, psychoeducation, psychotherapy  Treatment Goals addressed: Coping  Interventions: CBT, DBT, Solution Focused, Supportive and Reframing  Summary:  Clinician led discussion on how formulate goals for pressing issues. Patients discussed areas where they feel they could incorporate goals that are achievable.     Therapist Response: Patient engaged and participated in discussion. Pt. states that she will create goals that she can apply to finding ways to expand her social circle, such as trying to volunteer more often.    Session Time: 11:00  -12:00   Participation Level: Active   Behavioral Response: CasualAlertDepressed   Type of Therapy: Group Therapy, OT   Treatment Goals addressed: Coping   Interventions: Psychosocial skills training, Supportive,    Summary:  Occupational Therapy group   Therapist Response: Patient engaged in group. See OT note.       Session Time: 12:00- 1:00  Participation Level: Active  Behavioral Response: CasualAlertAnxious and Depressed  Type of Therapy: Group Therapy, Psychoeducation; Psychotherapy  Treatment Goals addressed: Coping  Interventions: CBT; Solution focused; Supportive; Reframing  Summary: 12:00 - 12:50: Clinician introduced Positive Psychology Ted Talk. Group members discussed ways they would utilize the Five Positive Psychology skills, for the next 21 days choosing between; 3 gratitudes, positive journaling, exercise, meditation and random acts of kindness.    12:50 -1:00 Clinician led check-out. Clinician assessed for immediate needs, medication compliance and efficacy, and safety concerns   Therapist Response: Patient engaged in activity and discussion and chose to try positive journaling for 21 days.   At check-out, patient rates her mood at an 8 on a scale of 1-10 with 10 being great. Patient reports weekend plans to volunteer and hang out at home. Patient demonstrates some progress as evidenced by participating in group. Pt denies SI/HI/self-harm thought at end of group.   Suicidal/Homicidal: Nowithout intent/plan   Plan: Pt will continue in PHP while working on ability to manage depression and anxiety symptoms on own and gain coping skills.  Diagnosis: Bipolar 2 disorder (HCC) [F31.81]    1. Bipolar 2 disorder (HCC)   2. Mood disorder in conditions classified elsewhere       [employer], LPCA, LCASA 06/03/2018

## 2018-06-03 NOTE — Psych (Signed)
[facility] PHP THERAPIST PROGRESS NOTE  Jill Alexander [id number]  Session Time: 9:00 - 10:15  Participation Level: Active  Behavioral Response: CasualAlertAnxious and Depressed  Type of Therapy: Group Therapy  Treatment Goals addressed: Coping  Interventions: CBT, DBT, Solution Focused, Strength-based and Reframing  Summary: Clinician led check-in regarding current stressors and situation, and review of patient completed daily inventory. Clinician utilized active listening and empathetic response and validated patient emotions. Clinician facilitated processing group on pertinent issues.   Therapist Response:  Jill Alexander is a 23 y.o. female who presents with anxiety and depression symptoms. Patient arrived within time allowed and reports that she is having "big mood swings" this morning. Patient rates her mood at a 1 on a scale of 1-10 with 10 being great. Pt reports she still has a cold and woke up with a headache. Pt states she went to a group last night and then did nothing at home. Pt reports anxiety about upcoming discharge. Pt able to process.      Session Time: 10:15 -11:00  Participation Level: Active  Behavioral Response: CasualAlertDepressed  Type of Therapy: Group Therapy, psychoeducation, psychotherapy  Treatment Goals addressed: Coping  Interventions: CBT, DBT, Solution Focused, Supportive and Reframing  Summary:  Clinician led discussion on next steps and how to handle transitions. Cln encouraged group to think of specific steps and utilize SMART goals from yesterday's group.     Therapist Response: Patient engaged and participated in discussion. Pt shares change is a big struggle for her in any area and that she does not handle transitions well. Pt became emotional and tried to halt conversation. Cln attempted to make this a workable moment and help pt walk through managing an emotion roller coaster. Pt was resistant and stopped talking. Pt reports  she "can't" do anything and "nothing works" when she becomes overwhelmed or emotional. Cln reminded pt of times when she had successfully done just that in group and pt states "I don't want to talk anymore."       Session Time: 11:00 -12:00   Participation Level: Active   Behavioral Response: CasualAlertDepressed   Type of Therapy: Group Therapy, OT   Treatment Goals addressed: Coping   Interventions: Psychosocial skills training, Supportive,    Summary:  Occupational Therapy group   Therapist Response: Patient engaged in group. See OT note.       Session Time: 12:00- 1:00  Participation Level: Active  Behavioral Response: CasualAlertAnxious and Depressed  Type of Therapy: Group Therapy, Psychoeducation; Psychotherapy  Treatment Goals addressed: Coping  Interventions: CBT; Solution focused; Supportive; Reframing  Summary: 12:00 - 12:50: Group viewed TED talk, "What I learned from 100 Days of Rejection" and engaged in discussion regarding how rejection has affected their lives. 12:50 -1:00 Clinician led check-out. Clinician assessed for immediate needs, medication compliance and efficacy, and safety concerns   Therapist Response: Patient engaged in discussion and reports that fear of rejection is a big concern for her especially in social situations. Pt states she opts to not put herself in situations where she may be rejected and is able to recognize how that limits her.  At check-out, patient rates her mood at an 4 on a scale of 1-10 with 10 being great. Patient reports weekend plans to go to church with her family, go to the gym, and rest to help heal from her cold. Patient demonstrates some progress as evidenced by participating despite challenges. Pt denies SI/HI/self-harm thoughts at end of group.   Suicidal/Homicidal:  Nowithout intent/plan   Plan: Pt will continue in PHP while working on ability to manage depression and anxiety symptoms on own and gain coping  skills.  Diagnosis: Bipolar 2 disorder (HCC) [F31.81]    1. Bipolar 2 disorder (HCC)   2. Mood disorder in conditions classified elsewhere   3. Autism spectrum disorder       Jill Guiles, LCSW 06/03/2018

## 2018-06-03 NOTE — Psych (Signed)
[facility]  Jill BickerMadeline Sarah Alexander [id number]  Session Time: 9:00 - 11:00  Participation Level: Active  Behavioral Response: CasualAlertAnxious and Depressed  Type of Therapy: Group Therapy  Treatment Goals addressed: Coping  Interventions: CBT, DBT, Solution Focused, Supportive and Reframing  Summary:  Clinician led check-in regarding current stressors and situation, and review of patient completed daily inventory. Clinician utilized active listening and empathetic response and validated patient emotions. Clinician facilitated processing group on pertinent issues.   Therapist Response: Jill BickerMadeline Sarah Alexander is a 23 y.o. female who presents with anxiety and depression symptoms. Patient arrived within time allowed and reports that she is feeling "really anxious." Patient rates her mood at a 1.5 on a scale of 1-10 with 10 being great. Pt states that she thinks she is getting a cold and that is making her more vulnerable to stress. Pt reports yesterday was "not so great" and that she felt overwhlemed and stressed but was able to use her coping skills "but they didn't really work." Cln discussed with pt the purpose of coping skills and that they were meant to make symptoms manageable not erase them completely. Pt states she continues to have high anxiety this morning and "almost cried" in the waiting room before group and can't say why. Patient continues to struggle with emotionally dependent thinking. Patient engaged in discussion.       Session Time: 11:00 - 12:00   Participation Level: Active   Behavioral Response: CasualAlertDepressedAnxious   Type of Therapy: Group Therapy, OT   Treatment Goals addressed: Coping   Interventions: Psychosocial skills training, Supportive,    Summary:  Occupational Therapy group   Therapist Response: Patient engaged in group. See OT note.          Session Time: 12:00 - 12:45  Participation Level:  Active  Behavioral Response: CasualAlertDepressedAnxious  Type of Therapy: Group Therapy, Activity Therapy  Treatment Goals addressed: Coping  Interventions: Psychologist, occupationalocial Skills Training, Supportive  Summary:  Reflection Group: Patients encouraged to practice skills and interpersonal techniques or work on mindfulness and relaxation techniques. The importance of self-care and making skills part of a routine to increase usage were stressed   Therapist Response: Patient engaged and participated appropriately.       Session Time: 12:45- 2:00  Participation Level: Active  Behavioral Response: CasualAlertDepressedAnxious  Type of Therapy: Group Therapy, Psychoeducation; Psychotherapy  Treatment Goals addressed: Coping  Interventions: CBT; Solution focused; Supportive; Reframing  Summary: 12:45 - 1:50: Cln continued topic of cognitive distortions. Group discussed how to "challenge" the unhealthy thought patterns once recognized. Group reviewed "Socratic Questions" handout and went over how to challenge irrational thoughts using that handout. 1:50 -2:00 Clinician led check-out. Clinician assessed for immediate needs, medication compliance and efficacy, and safety concerns   Therapist Response: Patient engaged in activity. Pt states understanding of how to challenge unhealthy thoughts and successfully reframed examples in discussion.  At check-out, patient rates her mood at a 3 on a scale of 1-10 with 10 being great. Patient reports she has no afternoon plans. Patient demonstrates some progress as evidenced by remaining in group despite elevated emotions. Patient denies SI/HI/self-harm at the end of group.      Suicidal/Homicidal: Nowithout intent/plan    Plan: Pt will continue in PHP while working to decrease anxiety and depression symptoms and increase ability to self-manage symptoms.    Diagnosis: Bipolar 2 disorder (HCC) [F31.81]    1. Bipolar 2 disorder (HCC)   2. Autism  spectrum  disorder       Jill Alexander, [place] 06/03/2018

## 2018-06-03 NOTE — Psych (Addendum)
[facility]  Jill Alexander [id number]  Session Time: 9:00 - 11:00  Participation Level: Active  Behavioral Response: CasualAlertAnxious and Depressed  Type of Therapy: Individual Therapy  Treatment Goals addressed: Coping  Interventions: CBT, DBT, Solution Focused, Supportive and Reframing  Summary:  Clinician led check-in regarding current stressors and situation, and review of patient completed daily inventory. Clinician utilized active listening and empathetic response and validated patient emotions. Clinician facilitated processing group on pertinent issues.   Therapist Response: Jill Alexander is a 23 y.o. female who presents with anxiety and depression symptoms. Patient arrived within time allowed and reports that she is feeling "off and anxious." Patient rates her mood at a 3 on a scale of 1-10 with 10 being great. Pt reports she is feeling increased anxiety today because she has been feeling nauseous "off and on" since last night. Pt reports this has been a struggle for the past few months and she has seen her PCP and a specialist regarding this matter. Pt states she had an endoscopy and there was nothing abnormal. 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 Pt reports she made an appointment with her gastroenterologist for after group. Pt is able to process which cognitive distortions are at play in this situation and how to combat them. Pt recognizes catastrophizing, fortune telling, magnifying, and all or nothing thinking. Pt shares the ways in which it has been a struggle for her to acclimate since her family moved in 2018. Pt has awareness that change is difficult for her and that it is partly due to her autism spectrum disorder. Pt shares one of her triggers is comparisons and discusses ways in which the relationship with her brother breeds comparisons. Pt is quickly able to give counters to irrational patterns within these comparisons and  it seems that the challenges are things pt has been coached on. Pt shares her main struggle with comparisons is she does not believe the challenges. Cln encouraged pt to challenge her skepticism as well and if that doesn't work, to continue to say it even though she doesn't believe it. Patient engaged in discussion.       Session Time: 11:00 - 12:00   Participation Level: Active   Behavioral Response: CasualAlertDepressed   Type of Therapy: OT   Treatment Goals addressed: Coping   Interventions: Psychosocial skills training, Supportive,    Summary:  Occupational Therapy group   Therapist Response: Patient engaged in group. See OT note.         Session Time: 12:00- 1:15  Participation Level: Active  Behavioral Response: CasualAlertDepressed  Type of Therapy: Individual Therapy  Treatment Goals addressed: Coping  Interventions: CBT; Solution focused; Supportive; Reframing  Summary: 12:00 - 1:05Clinician introduced vision board activity. Cln explained purpose of vision boards; to visually display goals and desires that you wish to manifest in the coming year. Cln asked pt to consider what she would like to see change for her in the next 11 months.  When pt became "really anxious," cln de-escalated pt by talking to her for distraction. Cln engaged pt in discussion on coping skills and how pt has managed her anxiety in the past. Cln cued pt on ways to take action right then and validated pt's efforts.  Cln observes this is a pattern in pt's group behaviors. Pt will become visibly agitated AEB leg jiggling, intermittent tearfulness, and redness in her face and when attention is drawn to it, pt shares she is  having a "melt down." Pt is typically de-escalated within 60 seconds. Pt knows how to calm herself down and when asked can list at least 3 things she could do in the moment. There seems to be a gap whether it be processing, defiance, attention seeking, or panic that prevents  her from utilizing the skills she has. This happens multiple times a day in group, however the display this afternoon is the most extensive in group thus far. 1:05 -1:15 Clinician led check-out. Clinician assessed for immediate needs, medication compliance and efficacy, and safety concerns   Therapist Response: Patient engaged in activity for approximately 20 minutes before stopping. Pt stated that the tremor in her hand was getting worse which was making her "really anxious." Pt states this has happened before but it is not very common. Pt willing enters in conversation with cln. Pt states that she has skills but "they don't always work." Pt shares at home she uses her wieghted blanket, a heated neck pillow, an exercise ball, essential oils in her diffuser, listening to pod casts, and sometimes walking. Pt reports that right now she could use a stress ball, fidget spinner, a vial of lavender she has in her purse, deep breathing, or pacing. Pt decided to use lavender and the fidget spinner in the moment and is visibly de-escalating throughout the discussion with cln.  At check-out, patient rates her mood at a 2 on a scale of 1-10 with 10 being great. Patient reports afternoon plans of going to her GI appointment and going home. Pt declines to speculate what she might do because "it depends on how I feel." Patient demonstrates some progress as evidenced by awareness of coping skills, however struggles with utilizing. Patient denies SI/HI/self-harm at the end of group.      Suicidal/Homicidal: Nowithout intent/plan    Plan: Pt will continue in PHP while working to decrease anxiety and depression symptoms and increase ability to self-manage symptoms.    Diagnosis: Bipolar 2 disorder (HCC) [F31.81]    1. Bipolar 2 disorder (HCC)   2. Autism spectrum disorder       Donia Guiles, LCSW 06/03/2018

## 2018-06-04 ENCOUNTER — Ambulatory Visit (HOSPITAL_COMMUNITY): Payer: Medicaid Other

## 2018-06-04 ENCOUNTER — Ambulatory Visit (HOSPITAL_COMMUNITY): Payer: Self-pay

## 2018-06-04 ENCOUNTER — Other Ambulatory Visit (HOSPITAL_COMMUNITY): Payer: Self-pay

## 2018-06-04 NOTE — Psych (Signed)
   [facility] PHP THERAPIST PROGRESS NOTE  Jacklynn Barrand [id number]  Session Time: 9:00 - 11:00  Participation Level: Active  Behavioral Response: CasualAlertAnxious and Depressed  Type of Therapy: Group Therapy  Treatment Goals addressed: Coping  Interventions: CBT, DBT, Solution Focused, Supportive and Reframing  Summary:  Clinician led check-in regarding current stressors and situation, and review of patient completed daily inventory. Clinician utilized active listening and empathetic response and validated patient emotions. Clinician facilitated processing group on pertinent issues.   Therapist Response: Julanne Bertolet is a 23 y.o. female who presents with anxiety and depression symptoms. Patient arrived within time allowed and reports that she is feeling "not good." Patient rates her mood at a 1.5 on a scale of 1-10 with 10 being great. Pt reports her cold continues to linger and she is not handling it well. Pt shares she does not get sick often and feels ill prepared to handle sickness and deal with it, therefore "not like a pity party, but maybe kind of." Pt reports off and on headache and congestion. Pt states her mom is a Engineer, civil (consulting) and has been monitoring her and says it's a cold. Pt shares she had a "melt down" this morning because she did not want to get out of bed "because I'm sick." Pt describes this melt down as "just getting really agitated and crying some." Pt states her weekend went "okay" and that her and her mom interviewed a companion and pt liked her. Pt shares they will try her out on a 2 week trial basis and that she is supposed to help pt increase social activity and independence. Pt states she went to church with her parents on Sunday and at a meeting after the service had a "melt down" and had to go sit in the car until her parents were done. Pt describes the melt down as "getting really anxious and I didn't want to be around people." Pt reports a positive thing that  happened was being able to text with her sister who is a support for pt. Patient continues to struggle with understanding her emotional reactions and being able to self-cue skills to manage the feelings. Patient engaged in discussion.       Session Time: 11:00 - 12:00   Participation Level: Active   Behavioral Response: CasualAlertDepressedAnxious   Type of Therapy: Group Therapy, OT   Treatment Goals addressed: Coping   Interventions: Psychosocial skills training, Supportive,    Summary:  Occupational Therapy group   Therapist Response: Patient engaged in group. See OT note.     **Pt chose to leave group at 12 due to feeling ill. Pt denies SI/HI and states "I just need to rest."     Suicidal/Homicidal: Nowithout intent/plan    Plan: Pt will continue in PHP while working to decrease anxiety and depression symptoms and increase ability to self-manage symptoms.    Diagnosis: Bipolar 2 disorder (HCC) [F31.81]    1. Bipolar 2 disorder (HCC)   2. Autism spectrum disorder       Donia Guiles, LCSW 06/04/2018

## 2018-06-05 ENCOUNTER — Other Ambulatory Visit (HOSPITAL_COMMUNITY): Payer: Self-pay

## 2018-06-05 ENCOUNTER — Ambulatory Visit (HOSPITAL_COMMUNITY): Payer: Medicaid Other

## 2018-06-05 ENCOUNTER — Ambulatory Visit (HOSPITAL_COMMUNITY): Payer: Self-pay

## 2018-06-08 ENCOUNTER — Ambulatory Visit (HOSPITAL_COMMUNITY): Payer: Self-pay

## 2018-06-08 ENCOUNTER — Other Ambulatory Visit (HOSPITAL_COMMUNITY): Payer: Self-pay

## 2018-06-08 ENCOUNTER — Ambulatory Visit (HOSPITAL_COMMUNITY): Payer: Medicaid Other

## 2018-06-09 ENCOUNTER — Ambulatory Visit (HOSPITAL_COMMUNITY): Payer: Self-pay

## 2018-06-09 ENCOUNTER — Ambulatory Visit (HOSPITAL_COMMUNITY): Payer: Medicaid Other

## 2018-06-09 ENCOUNTER — Ambulatory Visit (INDEPENDENT_AMBULATORY_CARE_PROVIDER_SITE_OTHER): Payer: No Typology Code available for payment source | Admitting: Clinical

## 2018-06-09 DIAGNOSIS — N926 Irregular menstruation, unspecified: Secondary | ICD-10-CM

## 2018-06-09 DIAGNOSIS — F840 Autistic disorder: Secondary | ICD-10-CM

## 2018-06-09 MED ORDER — NORETHIN ACE-ETH ESTRAD-FE 1-20 MG-MCG(24) PO CAPS: 1.0000 | ORAL_CAPSULE | Freq: Every day | ORAL | 6 refills | Status: DC

## 2018-06-10 ENCOUNTER — Other Ambulatory Visit (HOSPITAL_COMMUNITY): Payer: Self-pay

## 2018-06-10 ENCOUNTER — Ambulatory Visit (HOSPITAL_COMMUNITY): Payer: Medicaid Other

## 2018-06-11 ENCOUNTER — Ambulatory Visit (HOSPITAL_COMMUNITY): Payer: Self-pay

## 2018-06-11 ENCOUNTER — Other Ambulatory Visit (HOSPITAL_COMMUNITY): Payer: Self-pay

## 2018-06-11 ENCOUNTER — Telehealth: Payer: Self-pay | Admitting: Gastroenterology

## 2018-06-11 ENCOUNTER — Ambulatory Visit (HOSPITAL_COMMUNITY): Payer: Medicaid Other

## 2018-06-11 NOTE — Telephone Encounter (Signed)
Pt is scheduled for NM gastric emptying 3.25.20 at [facility].  Pt would like to cancel appt -- pt "changed her mind"

## 2018-06-11 NOTE — Telephone Encounter (Signed)
Left message. Asking the patient if she would like to wait until a little closer to the date of the appointment in case she changes her mind or if she is certain she is not going to do this.

## 2018-06-12 ENCOUNTER — Other Ambulatory Visit (HOSPITAL_COMMUNITY): Payer: Self-pay

## 2018-06-12 ENCOUNTER — Ambulatory Visit (HOSPITAL_COMMUNITY): Payer: Medicaid Other

## 2018-06-12 ENCOUNTER — Ambulatory Visit (HOSPITAL_COMMUNITY): Payer: Self-pay

## 2018-06-14 ENCOUNTER — Encounter: Payer: Self-pay | Admitting: Family Medicine

## 2018-06-15 ENCOUNTER — Ambulatory Visit (HOSPITAL_COMMUNITY): Payer: Medicaid Other

## 2018-06-16 ENCOUNTER — Ambulatory Visit (HOSPITAL_COMMUNITY): Payer: Medicaid Other

## 2018-06-17 ENCOUNTER — Ambulatory Visit (HOSPITAL_COMMUNITY): Payer: Medicaid Other

## 2018-06-18 ENCOUNTER — Ambulatory Visit (INDEPENDENT_AMBULATORY_CARE_PROVIDER_SITE_OTHER): Payer: No Typology Code available for payment source | Admitting: Psychiatry

## 2018-06-18 ENCOUNTER — Encounter (HOSPITAL_COMMUNITY): Payer: Self-pay | Admitting: Psychiatry

## 2018-06-18 ENCOUNTER — Ambulatory Visit (INDEPENDENT_AMBULATORY_CARE_PROVIDER_SITE_OTHER): Payer: No Typology Code available for payment source | Admitting: Clinical

## 2018-06-18 VITALS — BP 122/74 | Ht 63.0 in | Wt 146.0 lb

## 2018-06-18 DIAGNOSIS — F332 Major depressive disorder, recurrent severe without psychotic features: Secondary | ICD-10-CM

## 2018-06-18 NOTE — Progress Notes (Signed)
BH MD/PA/NP OP Progress Note  06/18/2018 3:33 PM Jill BickerMadeline Sarah Alexander  MRN:  [id number]  Chief Complaint:  Chief Complaint    Depression; Anxiety     HPI: Patient is here with her mother.  She has been using the Emsam patch for about 3 to 4 weeks now.  She reports no change in her depression symptoms.  Her mood fluctuates up and down.  She will be fine and then all of a sudden be intensely depressed or extremely irritable.  Mom states that patient is not doing well.  She tries to challenge herself but nothing has been consistent.  She is going to be attending DBT groups and uses her coping skills as much as possible.  She is been on many different medications and nothing has really been effective.  Patient is denying active SI but reports that passive SI most days. She denies HI.  Mom and patient both agree that her overall quality of life is poor.  If she continues to experience the depression as she is she will never be able to achieve her goals of finishing school and getting a job someday.  They are looking for an alternative and asked about TMS.  Due to patient's history of remote seizures she was declined as an inappropriate candidate.  Patient reports that the last time she felt that she was able to handle any amount of stress was back in high school.  Mom tells me that it was due to having a lot of support.  Visit Diagnosis:    ICD-10-CM   1. Mood disorder in conditions classified elsewhere F06.30       Past Psychiatric History: Patient denies any history of prior psychiatric hospitalizations.  She was treated with medications in the past-Lamictal, Abilify- flat and lethargic, Wellbutrin, Prozac, Ativan, Lithium.  Prior to [facility] outpatient patient was receiving [facility]. In [place] pt worked with one psychiatrist for over 10 yrs. Pt denies any hx of previous suicide attempts.   Past Medical History:  Past Medical History:  Diagnosis Date  . Anxiety   . Autism    . Depression   . GERD (gastroesophageal reflux disease)   . Seizures (HCC)   . 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 NO PAST SURGERIES      Family Psychiatric  History:  Family History  Problem Relation Age of Onset  . Depression Father   . Anxiety disorder Sister   . Bipolar disorder Sister   . Diabetes Maternal Grandfather   . Cancer Maternal Grandfather   . Breast cancer Paternal Grandmother   . Uterine cancer Paternal Grandmother   . Irritable bowel syndrome Paternal Grandmother   . Colon cancer Neg Hx   . Esophageal cancer Neg Hx   . Stomach cancer Neg Hx   . Rectal cancer Neg Hx     Social History:  Social History   Socioeconomic History  . Marital status: Single    Spouse name: Not on file  . Number of children: Not on file  . Years of education: Not on file  . Highest education level: Not on file  Occupational History  . Not on file  Social Needs  . Financial resource strain: Not hard at all  . Food insecurity:    Worry: Never true    Inability: Never true  . Transportation needs:    Medical: No    Non-medical: No  Tobacco Use  .  Smoking status: Never Smoker  . Smokeless tobacco: Never Used  Substance and Sexual Activity  . Alcohol use: No  . Drug use: No  . Sexual activity: Never  Lifestyle  . Physical activity:    Days per week: 2 days    Minutes per session: 30 min  . Stress: To some extent  Relationships  . Social connections:    Talks on phone: Never    Gets together: More than three times a week    Attends religious service: 1 to 4 times per year    Active member of club or organization: No    Attends meetings of clubs or organizations: Never    Relationship status: Not on file  Other Topics Concern  . Not on file  Social History Narrative   [place]ives with parents in [place]. Pt moved here from [place] in 2018.   Caffeine use: 2-3 cups per day   Left handed     Allergies: No Known  Allergies  Metabolic Disorder Labs: No results found for: HGBA1C, MPG No results found for: PROLACTIN No results found for: CHOL, TRIG, HDL, CHOLHDL, VLDL, LDLCALC Lab Results  Component Value Date   TSH 1.340 11/17/2017   TSH 0.677 08/21/2017    Therapeutic Level Labs: Lab Results  Component Value Date   LITHIUM 0.8 01/05/2018   LITHIUM 0.5 (L) 12/18/2017   No results found for: VALPROATE No components found for:  CBMZ  Current Medications: Current Outpatient Medications  Medication Sig Dispense Refill  . acetaminophen (TYLENOL) 500 MG tablet Take 500 mg by mouth every 6 (six) hours as needed.    . Cariprazine HCl 4.5 MG CAPS Take 1 capsule (4.5 mg total) by mouth daily. 30 capsule 1  . Ibuprofen (MOTRIN PO) Take 1 tablet by mouth as needed.     . lamoTRIgine (LAMICTAL) 150 MG tablet Take 1 tablet (150 mg total) by mouth 2 (two) times daily. 60 tablet 1  . LORazepam (ATIVAN) 2 MG tablet Take 1 tablet (2 mg total) by mouth 2 (two) times daily. 60 tablet 1  . Multiple Vitamins-Minerals (MULTIVITAMIN PO) Take 1 tablet by mouth daily.    . Norethin Ace-Eth Estrad-FE (TAYTULLA) 1-20 MG-MCG(24) CAPS Take 1 tablet by mouth daily. 28 capsule 6  . omeprazole (PRILOSEC) 40 MG capsule Take 1 capsule (40 mg total) by mouth 2 (two) times daily. 60 capsule 1  . ondansetron (ZOFRAN ODT) 8 MG disintegrating tablet Take 1 tablet (8 mg total) by mouth 2 (two) times daily. 60 tablet 3  . polyethylene glycol (MIRALAX) packet Take 17 g by mouth daily as needed. (Patient taking differently: Take 17 g by mouth as needed. ) 14 each 0  . selegiline (EMSAM) 6 MG/24HR Place 1 patch (6 mg total) onto the skin daily. 30 patch 12   No current facility-administered medications for this visit.      Musculoskeletal: Strength & Muscle Tone: within normal limits Gait & Station: normal Patient leans: N/A  Psychiatric Specialty Exam: Review of Systems  Constitutional: Negative for chills, diaphoresis and  fever.  Respiratory: Negative for cough, sputum production and shortness of breath.     Blood pressure 122/74, height 5\' 3"  (1.6 m), weight 146 lb (66.2 kg).Body mass index is 25.86 kg/m.  General Appearance: Fairly Groomed  Eye Contact:  Minimal  Speech:  Clear and Coherent and Slow  Volume:  Normal  Mood:  Anxious and Depressed  Affect:  Congruent  Thought Process:  Goal Directed and Descriptions of  Associations: Intact  Orientation:  Full (Time, Place, and Person)  Thought Content:  Logical  Suicidal Thoughts:  No  Homicidal Thoughts:  No  Memory:  Immediate;   Good  Judgement:  Fair  Insight:  Present  Psychomotor Activity:  Normal  Concentration:  Concentration: Good  Recall:  Good  Fund of Knowledge:  Good  Language:  Good  Akathisia:  No  Handed:  Right  AIMS (if indicated):     Assets:  Communication Skills Desire for Improvement Housing Social Support Talents/Skills Transportation  ADL's:  Intact  Cognition:  WNL  Sleep:            Screenings: GAD-7     Counselor from 06/01/2018 in BEHAVIORAL HEALTH PARTIAL HOSPITALIZATION PROGRAM Counselor from 05/22/2018 in BEHAVIORAL HEALTH PARTIAL HOSPITALIZATION PROGRAM Office Visit from 01/15/2018 [facility] Office Visit from 12/17/2017 in [facility] Office Visit from 08/21/2017 in [facility]  Total GAD-7 Score  18  19  15  14  18     PHQ2-9     Counselor from 06/01/2018 in BEHAVIORAL HEALTH PARTIAL HOSPITALIZATION PROGRAM Counselor from 05/22/2018 in BEHAVIORAL HEALTH PARTIAL HOSPITALIZATION PROGRAM Office Visit from 01/15/2018 in [facility] Office Visit from 12[facility]17/2017 in [facility] Office Visit from 08/21/2017 in [facility]  PHQ-2 Total Score  5  5  5  5  6   PHQ-9 Total Score  21  17  20  17  18       Reviewed the information below on 06/18/2018 and have updated it Assessment and  Plan: Mood disorder NOS versus bipolar 2 disorder-current episode depressed; seizure disorder; autism spectrum    Medication management with supportive therapy. Risks and benefits, side effects and alternative treatment options discussed with patient. Pt was given an opportunity to ask questions about medication, illness, and treatment. All current psychiatric medications have been reviewed and discussed with the patient and adjusted as clinically appropriate. The patient has been provided an accurate and updated list of the medications being now prescribed. Pt verbalized understanding and verbal consent obtained for treatment.  The risk of un-intended pregnancy is low based on the fact that pt reports she is using daily OCP. Pt is aware that these meds carry a teratogenic risk. Pt will discuss plan of action if she does or plans to become pregnant in the future.  Status of current problems: Ongoing depression  Meds: Cariprazine 4.5 mg p.o. daily Lamictal 150 mg p.o. twice daily Ativan 2mg  po BID prn anxiety/mood swings Emsam patch 6mg  to skin daily  Labs: none  Therapy: brief supportive therapy provided. Discussed psychosocial stressors in detail.  - talked about showerless wipes as option for good hygeine  Consultations: Encouraged to follow up with therapist-Dr. Jeanene Erb Starting DBT thru [facility] (referred by therapist) at [facility] Supported employment starting in March Encouraged to follow up with PCP as needed Referred for ECT  Pt's acute risk factors for suicide are ongoing depression with on/off passive SI. Pt's chronic risk factors are chronic mental illness. Pt's protective factors are social support. Pt denies SI today and is at an acute low risk for suicide. Patient told to call clinic if any problems occur. Patient advised to go to ER if they should develop SI/HI, side effects, or if symptoms worsen. Pt has crisis numbers to call if needed. Pt acknowledged and agreed with plan and  verbalized understanding.  F/up in 1  months or sooner if needed  The duration of this appointment visit was 30 minutes of face-to-face time with the patient.  Greater than 50% of this time was spent in counseling, explanation of  diagnosis, planning of further management, and coordination of care    Oletta Darter, MD 06/18/2018, 3:33 PM

## 2018-06-19 NOTE — Telephone Encounter (Signed)
Contacted the patient. She does want the GES cancelled.  Radiology scheduling contacted.

## 2018-06-20 ENCOUNTER — Other Ambulatory Visit (HOSPITAL_COMMUNITY): Payer: Self-pay | Admitting: Psychiatry

## 2018-06-20 DIAGNOSIS — F3181 Bipolar II disorder: Secondary | ICD-10-CM

## 2018-06-20 DIAGNOSIS — F0630 Mood disorder due to known physiological condition, unspecified: Secondary | ICD-10-CM

## 2018-06-23 ENCOUNTER — Ambulatory Visit: Payer: Self-pay | Admitting: Gastroenterology

## 2018-06-23 ENCOUNTER — Ambulatory Visit (INDEPENDENT_AMBULATORY_CARE_PROVIDER_SITE_OTHER): Payer: No Typology Code available for payment source | Admitting: Clinical

## 2018-06-23 DIAGNOSIS — F840 Autistic disorder: Secondary | ICD-10-CM | POA: Diagnosis not present

## 2018-06-24 ENCOUNTER — Ambulatory Visit: Payer: No Typology Code available for payment source | Admitting: Clinical

## 2018-06-25 MED FILL — EMSAM 6 MG/24 HOURS PATCH: 6 | 30 days supply | Qty: 30 | Fill #0

## 2018-06-29 ENCOUNTER — Encounter: Payer: Self-pay | Admitting: Psychiatry

## 2018-06-29 ENCOUNTER — Ambulatory Visit (INDEPENDENT_AMBULATORY_CARE_PROVIDER_SITE_OTHER): Payer: No Typology Code available for payment source | Admitting: Psychiatry

## 2018-06-29 VITALS — BP 118/70 | HR 89 | Temp 98.7°F | Wt 144.0 lb

## 2018-06-29 DIAGNOSIS — F332 Major depressive disorder, recurrent severe without psychotic features: Secondary | ICD-10-CM | POA: Diagnosis not present

## 2018-06-29 DIAGNOSIS — F840 Autistic disorder: Secondary | ICD-10-CM

## 2018-06-29 DIAGNOSIS — F4010 Social phobia, unspecified: Secondary | ICD-10-CM | POA: Diagnosis not present

## 2018-06-29 DIAGNOSIS — F070 Personality change due to known physiological condition: Secondary | ICD-10-CM | POA: Diagnosis not present

## 2018-06-29 NOTE — Progress Notes (Signed)
ECT: This is an ECT consult for this 23 year old woman referred by her outpatient psychiatrist.  Patient seen chart reviewed.  Patient presented along with her mother who also gave history.  Patient currently reports symptoms of constantly low and depressed mood with frequent periods of "mood swings" during which she can become irritable or very sad.  These can last for hours at a time and are frequently disabling.  Sometimes they are caused by stressors but they are not always predictable.  Patient feels tired rundown all the time.  Feels unable to accomplish any of her goals.  She admits to suicidal thoughts but denies any intention or plan.  She is currently receiving outpatient psychiatric treatment and is on medication including lamotrigine, Vraylar and Ativan.  Also receiving regular psychotherapy.  Treatment thus far has been of only partial benefit.  Patient is very impaired.  Not currently working not going to school.  Medical history: Patient has a past history of seizures.  Unknown cause.  Has not had a seizure in about 5 years.  Currently still on lamotrigine but no other anticonvulsant medicine.  Substance abuse history: Denies alcohol or drug abuse  Social history: Patient is currently living with her parents.  Not working outside the home.  Had taken one class last semester with difficulty but does not feel capable of taking any classes at this point.  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  Also depression which has been present for years now.  No inpatient hospitalization.  Multiple antidepressants multiple antipsychotics and mood stabilizers.  Mental status exam: This is a casually dressed woman who looks her stated age.  Patient appears clean but not particularly well groomed.  She is cooperative with the interview however she has frequent odd facial movements possibly tics.  Patient was  compliant and appropriate during the interview.  Affect blunted and flat.  Speech decreased in amount.  Affect seems anxious.  Endorse suicidal ideation without specific plan or intent.  No homicidal ideation.  Denied any hallucinations or psychotic symptoms.  Assessment: This is a 23 year old woman with autistic spectrum disorder and recurrent severe symptoms of depression unresponsive to medication.  She is interested in pursuing electroconvulsive therapy.  Patient and mother were presented with information about ECT and given opportunity to ask questions as much as needed.  Patient does not appear to have any specific contraindication to ECT.  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  Patient's medications may complicate the course.  We will for now continue the lamotrigine but I suggest at least not taking any Ativan the night before or morning of treatment.  We will try to aim for beginning treatment by next Monday.  Patient was given a form for the laboratory work-up.  Email will be sent to psychiatric nursing and utilization review.  Tentatively plan to start ECT on the ninth.

## 2018-07-01 ENCOUNTER — Ambulatory Visit
Admission: RE | Admit: 2018-07-01 | Discharge: 2018-07-01 | Disposition: A | Discharge status: Home / Self Care | Payer: No Typology Code available for payment source | Source: Ambulatory Visit | Attending: Psychiatry | Admitting: Psychiatry

## 2018-07-01 ENCOUNTER — Encounter
Admission: RE | Admit: 2018-07-01 | Discharge: 2018-07-01 | Disposition: A | Discharge status: Home / Self Care | Payer: No Typology Code available for payment source | Source: Ambulatory Visit | Attending: Psychiatry | Admitting: Psychiatry

## 2018-07-01 ENCOUNTER — Other Ambulatory Visit: Payer: Self-pay

## 2018-07-01 DIAGNOSIS — Z01818 Encounter for other preprocedural examination: Secondary | ICD-10-CM

## 2018-07-01 HISTORY — DX: Cardiac murmur, unspecified: R01.1

## 2018-07-01 LAB — CBC
HCT: 43.9 % (ref 36.0–46.0)
Hemoglobin: 14 g/dL (ref 12.0–15.0)
MCH: 30.1 pg (ref 26.0–34.0)
MCHC: 31.9 g/dL (ref 30.0–36.0)
MCV: 94.4 fL (ref 80.0–100.0)
PLATELETS: 300 10*3/uL (ref 150–400)
RBC: 4.65 MIL/uL (ref 3.87–5.11)
RDW: 12.8 % (ref 11.5–15.5)
WBC: 8.5 10*3/uL (ref 4.0–10.5)
nRBC: 0 % (ref 0.0–0.2)

## 2018-07-01 LAB — BASIC METABOLIC PANEL
Anion gap: 11 (ref 5–15)
BUN: 8 mg/dL (ref 6–20)
CO2: 25 mmol/L (ref 22–32)
Calcium: 9.3 mg/dL (ref 8.9–10.3)
Chloride: 105 mmol/L (ref 98–111)
Creatinine, Ser: 0.61 mg/dL (ref 0.44–1.00)
GFR calc Af Amer: >60 mL/min (ref >60)
GFR calc non Af Amer: >60 mL/min (ref >60)
GLUCOSE: 83 mg/dL (ref 70–99)
Potassium: 3.8 mmol/L (ref 3.5–5.1)
Sodium: 141 mmol/L (ref 135–145)

## 2018-07-01 LAB — URINALYSIS, ROUTINE W REFLEX MICROSCOPIC
Bilirubin Urine: NEGATIVE
Glucose, UA: NEGATIVE mg/dL
Hgb urine dipstick: NEGATIVE
Ketones, ur: NEGATIVE mg/dL
Leukocytes,Ua: NEGATIVE
Nitrite: NEGATIVE
Protein, ur: NEGATIVE mg/dL
SPECIFIC GRAVITY, URINE: 1.012 (ref 1.005–1.030)
pH: 8 (ref 5.0–8.0)

## 2018-07-06 ENCOUNTER — Ambulatory Visit
Admission: RE | Admit: 2018-07-06 | Discharge: 2018-07-06 | Disposition: A | Discharge status: Home / Self Care | Payer: No Typology Code available for payment source | Source: Ambulatory Visit | Attending: Psychiatry | Admitting: Psychiatry

## 2018-07-06 ENCOUNTER — Ambulatory Visit: Payer: Self-pay | Admitting: Anesthesiology

## 2018-07-06 ENCOUNTER — Other Ambulatory Visit: Payer: Self-pay

## 2018-07-06 ENCOUNTER — Encounter: Payer: Self-pay | Admitting: Anesthesiology

## 2018-07-06 DIAGNOSIS — F332 Major depressive disorder, recurrent severe without psychotic features: Secondary | ICD-10-CM | POA: Diagnosis not present

## 2018-07-06 DIAGNOSIS — F329 Major depressive disorder, single episode, unspecified: Secondary | ICD-10-CM | POA: Insufficient documentation

## 2018-07-06 DIAGNOSIS — R569 Unspecified convulsions: Secondary | ICD-10-CM | POA: Insufficient documentation

## 2018-07-06 DIAGNOSIS — F419 Anxiety disorder, unspecified: Secondary | ICD-10-CM | POA: Diagnosis not present

## 2018-07-06 LAB — POCT PREGNANCY, URINE: Preg Test, Ur: NEGATIVE

## 2018-07-06 MED ORDER — METHOHEXITAL SODIUM 0.5 G IJ SOLR
INTRAMUSCULAR | Status: AC
  Filled 2018-07-06: qty 500

## 2018-07-06 MED ORDER — FENTANYL CITRATE (PF) 100 MCG/2ML IJ SOLN: 25.0000 ug | INTRAMUSCULAR | Status: DC | PRN

## 2018-07-06 MED ORDER — SUCCINYLCHOLINE CHLORIDE 20 MG/ML IJ SOLN
INTRAMUSCULAR | Status: AC
  Filled 2018-07-06: qty 1

## 2018-07-06 MED ORDER — MIDAZOLAM HCL 2 MG/2ML IJ SOLN
INTRAMUSCULAR | Status: AC
  Filled 2018-07-06: qty 2

## 2018-07-06 MED ORDER — SODIUM CHLORIDE 0.9 % IV SOLN
500.0000 mL | Freq: Once | INTRAVENOUS | Status: AC
  Administered 2018-07-06: 09:00:00 via INTRAVENOUS

## 2018-07-06 MED ORDER — SUCCINYLCHOLINE CHLORIDE 200 MG/10ML IV SOSY
PREFILLED_SYRINGE | INTRAVENOUS | Status: DC | PRN
  Administered 2018-07-06: 100 mg via INTRAVENOUS

## 2018-07-06 MED ORDER — MIDAZOLAM HCL 2 MG/2ML IJ SOLN
1.0000 mg | Freq: Once | INTRAMUSCULAR | Status: AC
  Administered 2018-07-06: 2 mg via INTRAVENOUS

## 2018-07-06 MED ORDER — METHOHEXITAL SODIUM 100 MG/10ML IV SOSY
PREFILLED_SYRINGE | INTRAVENOUS | Status: DC | PRN
  Administered 2018-07-06: 70 mg via INTRAVENOUS

## 2018-07-06 MED ORDER — ONDANSETRON HCL 4 MG/2ML IJ SOLN: 4.0000 mg | Freq: Once | INTRAMUSCULAR | Status: DC | PRN

## 2018-07-06 NOTE — Anesthesia Post-op Follow-up Note (Signed)
Anesthesia QCDR form completed.        

## 2018-07-06 NOTE — Transfer of Care (Signed)
Immediate Anesthesia Transfer of Care Note  Patient: Jill Alexander  Procedure(s) Performed: ECT TX  Patient Location: PACU  Anesthesia Type:General  Level of Consciousness: sedated  Airway & Oxygen Therapy: Patient Spontanous Breathing and Patient connected to face mask oxygen  Post-op Assessment: Report given to RN and Post -op Vital signs reviewed and stable  Post vital signs: Reviewed and stable  Last Vitals:  Vitals Value Taken Time  BP    Temp    Pulse 107 07/06/2018 10:42 AM  Resp 23 07/06/2018 10:42 AM  SpO2 99 % 07/06/2018 10:42 AM  Vitals shown include unvalidated device data.  Last Pain:  Vitals:   07/06/18 0847  TempSrc: Oral  PainSc: 0-No pain         Complications: No apparent anesthesia complications

## 2018-07-06 NOTE — Anesthesia Postprocedure Evaluation (Signed)
Anesthesia Post Note  Patient: Jill Alexander  Procedure(s) Performed: ECT TX  Patient location during evaluation: PACU Anesthesia Type: General Level of consciousness: awake and alert Pain management: pain level controlled Vital Signs Assessment: post-procedure vital signs reviewed and stable Respiratory status: spontaneous breathing, nonlabored ventilation, respiratory function stable and patient connected to nasal cannula oxygen Cardiovascular status: blood pressure returned to baseline and stable Postop Assessment: no apparent nausea or vomiting Anesthetic complications: no     Last Vitals:  Vitals:   07/06/18 1110 07/06/18 1136  BP:  103/78  Pulse:  89  Resp:  (!) 1  Temp: 37.3 C 37.1 C  SpO2:      Last Pain:  Vitals:   07/06/18 1136  TempSrc: Oral  PainSc: 0-No pain                 Leslie Langille S

## 2018-07-06 NOTE — H&P (Signed)
Jill Alexander is an 23 y.o. female.   Chief Complaint: chronic depression and anxiety HPI: chronic depression Start index RUL ect today  Past Medical History:  Diagnosis Date  . Anxiety   . Autism   . Depression   . GERD (gastroesophageal reflux disease)   . Heart murmur   . Seizures (HCC)   . 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 NO PAST SURGERIES    . UPPER GI ENDOSCOPY      Family History  Problem Relation Age of Onset  . Depression Father   . Anxiety disorder Sister   . Bipolar disorder Sister   . Diabetes Maternal Grandfather   . Cancer Maternal Grandfather   . Breast cancer Paternal Grandmother   . Uterine cancer Paternal Grandmother   . Irritable bowel syndrome Paternal Grandmother   . Colon cancer Neg Hx   . Esophageal cancer Neg Hx   . Stomach cancer Neg Hx   . Rectal cancer Neg Hx    Social History:  reports that she has never smoked. She has never used smokeless tobacco. She reports that she does not drink alcohol or use drugs.  Allergies:  Allergies  Allergen Reactions  . Gluten Meal     (Not in a hospital admission)   Results for orders placed or performed during the hospital encounter of 07/06/18 (from the past 48 hour(s))  Pregnancy, urine POC     Status: None   Collection Time: 07/06/18  8:44 AM  Result Value Ref Range   Preg Test, Ur NEGATIVE NEGATIVE    Comment:        THE SENSITIVITY OF THIS METHODOLOGY IS >24 mIU/mL    No results found.  Review of Systems  Constitutional: Negative.   HENT: Negative.   Eyes: Negative.   Respiratory: Negative.   Cardiovascular: Negative.   Gastrointestinal: Negative.   Musculoskeletal: Negative.   Skin: Negative.   Neurological: Negative.   Psychiatric/Behavioral: Positive for depression. Negative for hallucinations, memory loss, substance abuse and suicidal ideas. The patient is nervous/anxious. The patient does not have insomnia.     Blood  pressure 116/69, pulse 66, temperature 98.7 F (37.1 C), temperature source Oral, resp. rate 16, height 5\' 2"  (1.575 m), weight 65.3 kg, SpO2 98 %. Physical Exam  Nursing note and vitals reviewed. Constitutional: She appears well-developed and well-nourished.  HENT:  Head: Normocephalic and atraumatic.  Eyes: Pupils are equal, round, and reactive to light. Conjunctivae are normal.  Neck: Normal range of motion.  Cardiovascular: Regular rhythm and normal heart sounds.  Respiratory: Effort normal. No respiratory distress.  GI: Soft.  Musculoskeletal: Normal range of motion.  Neurological: She is alert.  Skin: Skin is warm and dry.  Psychiatric: Judgment normal. Her affect is blunt. Her speech is delayed. She is slowed. Cognition and memory are normal. She expresses no suicidal ideation.     Assessment/Plan Start index ECT RUL today  Mordecai Rasmussen, MD 07/06/2018, 9:58 AM

## 2018-07-06 NOTE — Procedures (Signed)
ECT SERVICES Physician's Interval Evaluation & Treatment Note  Patient Identification: Jill Alexander MRN:  [id number] Date of Evaluation:  07/06/2018 TX #: 1  MADRS: 37 MMSE: 30  P.E. Findings:  Nothing remarkable on physical.  Vitals unremarkable.  Heart and lungs normal.  All lab studies unremarkable  Psychiatric Interval Note:  Chronic recurrent depression Subjective:  Patient is a 23 y.o. female seen for evaluation for Electroconvulsive Therapy. Depressed flat anxious  Treatment Summary:   [x]   Right Unilateral             []  Bilateral   % Energy : 0.3 ms 25%   Impedance: 1580 ohms  Seizure Energy Index: 11,138 V squared  Postictal Suppression Index: 94%  Seizure Concordance Index: 94%  Medications  Pre Shock: Brevital 70 mg succinylcholine 100 mg  Post Shock: Versed 2 mg  Seizure Duration: 95 seconds EMG 123 seconds EEG   Comments: Follow-up Wednesday Friday etc. continuing with index course  Lungs:  [x]   Clear to auscultation               []  Other:   Heart:    [x]   Regular rhythm             []  irregular rhythm    [x]   Previous H&P reviewed, patient examined and there are NO CHANGES                 []   Previous H&P reviewed, patient examined and there are changes noted.   Mordecai Rasmussen, MD 3/9/202010:14 AM

## 2018-07-06 NOTE — Anesthesia Preprocedure Evaluation (Signed)
Anesthesia Evaluation  Patient identified by MRN, date of birth, ID band Patient awake    Reviewed: Allergy & Precautions, NPO status , Patient's Chart, lab work & pertinent test results, reviewed documented beta blocker date and time   Airway Mallampati: II  TM Distance: >3 FB     Dental  (+) Chipped   Pulmonary           Cardiovascular + Valvular Problems/Murmurs      Neuro/Psych Seizures -,  PSYCHIATRIC DISORDERS Anxiety Depression Bipolar Disorder    GI/Hepatic GERD  Controlled,  Endo/Other    Renal/GU      Musculoskeletal   Abdominal   Peds  Hematology   Anesthesia Other Findings   Reproductive/Obstetrics                             Anesthesia Physical Anesthesia Plan  ASA: III  Anesthesia Plan: General   Post-op Pain Management:    Induction: Intravenous  PONV Risk Score and Plan:   Airway Management Planned:   Additional Equipment:   Intra-op Plan:   Post-operative Plan:   Informed Consent: I have reviewed the patients History and Physical, chart, labs and discussed the procedure including the risks, benefits and alternatives for the proposed anesthesia with the patient or authorized representative who has indicated his/her understanding and acceptance.       Plan Discussed with: CRNA  Anesthesia Plan Comments:         Anesthesia Quick Evaluation

## 2018-07-06 NOTE — Discharge Instructions (Signed)
1)  The drugs that you have been given will stay in your system until tomorrow so for the       next 24 hours you should not:  A. Drive an automobile  B. Make any legal decisions  C. Drink any alcoholic beverages  2)  You may resume your regular meals upon return home.  3)  A responsible adult must take you home.  Someone should stay with you for a few          hours, then be available by phone for the remainder of the treatment day.  4)  You May experience any of the following symptoms:  Headache, Nausea and a dry mouth (due to the medications you were given),  temporary memory loss and some confusion, or sore muscles (a warm bath  should help this).  If you you experience any of these symptoms let us know on                your return visit.  5)  Report any of the following: any acute discomfort, severe headache, or temperature        greater than 100.5 F.   Also report any unusual redness, swelling, drainage, or pain         at your IV site.    You may report Symptoms to:  ECT PROGRAM- [place] [facility]          Phone: [phone], ECT Department           or Dr. Shary Key office [phone]  6)  Your next ECT Treatment is March 11 at 8:15  We will call 2 days prior to your scheduled appointment for arrival times.  7)  Nothing to eat or drink after midnight the night before your procedure.  8)  Take     With a sip of water the morning of your procedure.  9)  Other Instructions: Call [phone] to cancel the morning of your procedure due         to illness or emergency.  10) We will call within 72 hours to assess how you are feeling.

## 2018-07-07 ENCOUNTER — Ambulatory Visit (INDEPENDENT_AMBULATORY_CARE_PROVIDER_SITE_OTHER): Payer: No Typology Code available for payment source | Admitting: Clinical

## 2018-07-07 ENCOUNTER — Other Ambulatory Visit: Payer: Self-pay | Admitting: Psychiatry

## 2018-07-07 DIAGNOSIS — F840 Autistic disorder: Secondary | ICD-10-CM

## 2018-07-08 ENCOUNTER — Encounter
Admission: RE | Admit: 2018-07-08 | Discharge: 2018-07-08 | Disposition: A | Discharge status: Home / Self Care | Payer: No Typology Code available for payment source | Source: Ambulatory Visit | Attending: Psychiatry | Admitting: Psychiatry

## 2018-07-08 ENCOUNTER — Encounter: Payer: Self-pay | Admitting: Anesthesiology

## 2018-07-08 ENCOUNTER — Other Ambulatory Visit: Payer: Self-pay

## 2018-07-08 DIAGNOSIS — Z818 Family history of other mental and behavioral disorders: Secondary | ICD-10-CM | POA: Diagnosis not present

## 2018-07-08 DIAGNOSIS — F332 Major depressive disorder, recurrent severe without psychotic features: Secondary | ICD-10-CM | POA: Diagnosis not present

## 2018-07-08 DIAGNOSIS — R569 Unspecified convulsions: Secondary | ICD-10-CM | POA: Insufficient documentation

## 2018-07-08 DIAGNOSIS — Z91018 Allergy to other foods: Secondary | ICD-10-CM | POA: Insufficient documentation

## 2018-07-08 DIAGNOSIS — Z803 Family history of malignant neoplasm of breast: Secondary | ICD-10-CM | POA: Diagnosis not present

## 2018-07-08 DIAGNOSIS — F329 Major depressive disorder, single episode, unspecified: Secondary | ICD-10-CM | POA: Insufficient documentation

## 2018-07-08 DIAGNOSIS — Z809 Family history of malignant neoplasm, unspecified: Secondary | ICD-10-CM | POA: Insufficient documentation

## 2018-07-08 DIAGNOSIS — F840 Autistic disorder: Secondary | ICD-10-CM | POA: Diagnosis not present

## 2018-07-08 DIAGNOSIS — Z833 Family history of diabetes mellitus: Secondary | ICD-10-CM | POA: Insufficient documentation

## 2018-07-08 DIAGNOSIS — F419 Anxiety disorder, unspecified: Secondary | ICD-10-CM | POA: Diagnosis not present

## 2018-07-08 DIAGNOSIS — Z8059 Family history of malignant neoplasm of other urinary tract organ: Secondary | ICD-10-CM | POA: Insufficient documentation

## 2018-07-08 MED ORDER — SODIUM CHLORIDE 0.9 % IV SOLN
INTRAVENOUS | Status: DC | PRN
  Administered 2018-07-08: 09:00:00 via INTRAVENOUS

## 2018-07-08 MED ORDER — METHOHEXITAL SODIUM 100 MG/10ML IV SOSY
PREFILLED_SYRINGE | INTRAVENOUS | Status: DC | PRN
  Administered 2018-07-08: 70 mg via INTRAVENOUS

## 2018-07-08 MED ORDER — MIDAZOLAM HCL 2 MG/2ML IJ SOLN
INTRAMUSCULAR | Status: AC
  Filled 2018-07-08: qty 2

## 2018-07-08 MED ORDER — SUCCINYLCHOLINE CHLORIDE 20 MG/ML IJ SOLN
INTRAMUSCULAR | Status: AC
  Filled 2018-07-08: qty 1

## 2018-07-08 MED ORDER — SUCCINYLCHOLINE CHLORIDE 200 MG/10ML IV SOSY
PREFILLED_SYRINGE | INTRAVENOUS | Status: DC | PRN
  Administered 2018-07-08: 100 mg via INTRAVENOUS

## 2018-07-08 MED ORDER — MIDAZOLAM HCL 2 MG/2ML IJ SOLN
2.0000 mg | Freq: Once | INTRAMUSCULAR | Status: AC
  Administered 2018-07-08: 2 mg via INTRAVENOUS

## 2018-07-08 MED ORDER — SODIUM CHLORIDE 0.9 % IV SOLN
500.0000 mL | Freq: Once | INTRAVENOUS | Status: AC
  Administered 2018-07-08: 500 mL via INTRAVENOUS

## 2018-07-08 NOTE — H&P (Signed)
Jill Alexander is an 23 y.o. female.   Chief Complaint: History of recurrent severe depression.  Mood currently stable no new complaints since last time. HPI: History of depression with minimal response to medicine.  Past Medical History:  Diagnosis Date  . Anxiety   . Autism   . Depression   . GERD (gastroesophageal reflux disease)   . Heart murmur   . Seizures (HCC)   . 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 NO PAST SURGERIES    . UPPER GI ENDOSCOPY      Family History  Problem Relation Age of Onset  . Depression Father   . Anxiety disorder Sister   . Bipolar disorder Sister   . Diabetes Maternal Grandfather   . Cancer Maternal Grandfather   . Breast cancer Paternal Grandmother   . Uterine cancer Paternal Grandmother   . Irritable bowel syndrome Paternal Grandmother   . Colon cancer Neg Hx   . Esophageal cancer Neg Hx   . Stomach cancer Neg Hx   . Rectal cancer Neg Hx    Social History:  reports that she has never smoked. She has never used smokeless tobacco. She reports that she does not drink alcohol or use drugs.  Allergies:  Allergies  Allergen Reactions  . Gluten Meal     (Not in a hospital admission)   No results found for this or any previous visit (from the past 48 hour(s)). No results found.  Review of Systems  Constitutional: Negative.   HENT: Negative.   Eyes: Negative.   Respiratory: Negative.   Cardiovascular: Negative.   Gastrointestinal: Negative.   Musculoskeletal: Negative.   Skin: Negative.   Neurological: Negative.   Psychiatric/Behavioral: Positive for depression. Negative for hallucinations, memory loss, substance abuse and suicidal ideas. The patient is not nervous/anxious and does not have insomnia.     Blood pressure 115/70, pulse 64, temperature 98.1 F (36.7 C), temperature source Oral, resp. rate 14, height 5\' 2"  (1.575 m), weight 66.2 kg, SpO2 100 %. Physical Exam   Constitutional: She appears well-developed and well-nourished.  HENT:  Head: Normocephalic and atraumatic.  Eyes: Pupils are equal, round, and reactive to light. Conjunctivae are normal.  Neck: Normal range of motion.  Cardiovascular: Normal heart sounds.  Respiratory: Effort normal.  GI: Soft.  Musculoskeletal: Normal range of motion.  Neurological: She is alert.  Skin: Skin is warm and dry.  Psychiatric: Judgment and thought content normal. Her speech is delayed. She is slowed. Cognition and memory are impaired. She exhibits a depressed mood. She expresses no suicidal ideation.     Assessment/Plan Continue index course just starting now.  Today's treatment #2.  Mordecai Rasmussen, MD 07/08/2018, 9:26 AM

## 2018-07-08 NOTE — Transfer of Care (Signed)
Immediate Anesthesia Transfer of Care Note  Patient: Jill Alexander  Procedure(s) Performed: ECT TX  Patient Location: PACU  Anesthesia Type:General  Level of Consciousness: sedated  Airway & Oxygen Therapy: Patient Spontanous Breathing and Patient connected to face mask oxygen  Post-op Assessment: Report given to RN and Post -op Vital signs reviewed and stable  Post vital signs: Reviewed and stable  Last Vitals:  Vitals Value Taken Time  BP    Temp    Pulse    Resp    SpO2      Last Pain:  Vitals:   07/08/18 0845  TempSrc: Oral  PainSc:          Complications: No apparent anesthesia complications

## 2018-07-08 NOTE — Anesthesia Procedure Notes (Signed)
Date/Time: 07/08/2018 10:44 AM Performed by: Lily Kocher, CRNA Pre-anesthesia Checklist: Patient identified, Emergency Drugs available, Suction available and Patient being monitored Patient Re-evaluated:Patient Re-evaluated prior to induction Oxygen Delivery Method: Circle system utilized Preoxygenation: Pre-oxygenation with 100% oxygen Induction Type: IV induction Ventilation: Mask ventilation without difficulty and Mask ventilation throughout procedure Airway Equipment and Method: Bite block Placement Confirmation: positive ETCO2 Dental Injury: Teeth and Oropharynx as per pre-operative assessment

## 2018-07-08 NOTE — Anesthesia Preprocedure Evaluation (Signed)
Anesthesia Evaluation  Patient identified by MRN, date of birth, ID band Patient awake    Reviewed: Allergy & Precautions, NPO status , Patient's Chart, lab work & pertinent test results  History of Anesthesia Complications Negative for: history of anesthetic complications  Airway Mallampati: II  TM Distance: >3 FB Neck ROM: Full    Dental no notable dental hx.    Pulmonary neg pulmonary ROS, neg sleep apnea, neg COPD,    breath sounds clear to auscultation- rhonchi (-) wheezing      Cardiovascular Exercise Tolerance: Good (-) hypertension(-) CAD, (-) Past MI, (-) Cardiac Stents and (-) CABG  Rhythm:Regular Rate:Normal - Systolic murmurs and - Diastolic murmurs    Neuro/Psych Seizures -, Well Controlled,  PSYCHIATRIC DISORDERS Anxiety Depression Bipolar Disorder    GI/Hepatic Neg liver ROS, GERD  ,  Endo/Other  negative endocrine ROSneg diabetes  Renal/GU negative Renal ROS     Musculoskeletal negative musculoskeletal ROS (+)   Abdominal (+) - obese,   Peds  Hematology negative hematology ROS (+)   Anesthesia Other Findings Past Medical History: No date: Anxiety No date: Autism No date: Depression No date: GERD (gastroesophageal reflux disease) No date: Heart murmur No date: Seizures (HCC) 04/09/2017: Tremor, essential   Reproductive/Obstetrics                             Anesthesia Physical Anesthesia Plan  ASA: II  Anesthesia Plan: General   Post-op Pain Management:    Induction: Intravenous  PONV Risk Score and Plan: 2 and Ondansetron  Airway Management Planned: Mask  Additional Equipment:   Intra-op Plan:   Post-operative Plan:   Informed Consent: I have reviewed the patients History and Physical, chart, labs and discussed the procedure including the risks, benefits and alternatives for the proposed anesthesia with the patient or authorized representative who has  indicated his/her understanding and acceptance.     Dental advisory given  Plan Discussed with: CRNA and Anesthesiologist  Anesthesia Plan Comments:         Anesthesia Quick Evaluation

## 2018-07-08 NOTE — Discharge Instructions (Signed)
1)  The drugs that you have been given will stay in your system until tomorrow so for the       next 24 hours you should not:  A. Drive an automobile  B. Make any legal decisions  C. Drink any alcoholic beverages  2)  You may resume your regular meals upon return home.  3)  A responsible adult must take you home.  Someone should stay with you for a few          hours, then be available by phone for the remainder of the treatment day.  4)  You May experience any of the following symptoms:  Headache, Nausea and a dry mouth (due to the medications you were given),  temporary memory loss and some confusion, or sore muscles (a warm bath  should help this).  If you you experience any of these symptoms let us know on                your return visit.  5)  Report any of the following: any acute discomfort, severe headache, or temperature        greater than 100.5 F.   Also report any unusual redness, swelling, drainage, or pain         at your IV site.    You may report Symptoms to:  ECT PROGRAM- [place] at [facility]          Phone: [phone], ECT Department           or Dr. Shary Key office [phone]  6)  Your next ECT Treatment is Friday March 13 at 8:15   We will call 2 days prior to your scheduled appointment for arrival times.  7)  Nothing to eat or drink after midnight the night before your procedure.  8)  Take     With a sip of water the morning of your procedure.  9)  Other Instructions: Call [phone] to cancel the morning of your procedure due         to illness or emergency.  10) We will call within 72 hours to assess how you are feeling.

## 2018-07-08 NOTE — Anesthesia Post-op Follow-up Note (Signed)
Anesthesia QCDR form completed.        

## 2018-07-08 NOTE — Procedures (Signed)
ECT SERVICES Physician's Interval Evaluation & Treatment Note  Patient Identification: Jill Alexander MRN:  [id number] Date of Evaluation:  07/08/2018 TX #: 2  MADRS:   MMSE:   P.E. Findings:  No change to physical exam  Psychiatric Interval Note:  No worse.  Subjective:  Patient is a 23 y.o. female seen for evaluation for Electroconvulsive Therapy. No new complaints  Treatment Summary:   [x]   Right Unilateral             []  Bilateral   % Energy : 0.3 ms 25%   Impedance: 1800 ohms  Seizure Energy Index: 6033 V squared  Postictal Suppression Index: 91%  Seizure Concordance Index: 95%  Medications  Pre Shock: Brevital 70 mg succinylcholine 100 mg  Post Shock: Versed 2 mg  Seizure Duration: 25 seconds EMG 161 seconds EEG   Comments: Follow-up Friday  Lungs:  [x]   Clear to auscultation               []  Other:   Heart:    [x]   Regular rhythm             []  irregular rhythm    [x]   Previous H&P reviewed, patient examined and there are NO CHANGES                 []   Previous H&P reviewed, patient examined and there are changes noted.   Mordecai Rasmussen, MD 3/11/202010:40 AM

## 2018-07-09 ENCOUNTER — Other Ambulatory Visit: Payer: Self-pay | Admitting: Psychiatry

## 2018-07-09 NOTE — Anesthesia Postprocedure Evaluation (Signed)
Anesthesia Post Note  Patient: Jill Alexander  Procedure(s) Performed: ECT TX  Patient location during evaluation: PACU Anesthesia Type: General Level of consciousness: awake and alert and oriented Pain management: pain level controlled Vital Signs Assessment: post-procedure vital signs reviewed and stable Respiratory status: spontaneous breathing, nonlabored ventilation and respiratory function stable Cardiovascular status: blood pressure returned to baseline and stable Postop Assessment: no signs of nausea or vomiting Anesthetic complications: no     Last Vitals:  Vitals:   07/08/18 1126 07/08/18 1146  BP: 97/73 118/79  Pulse: 94 98  Resp:  20  Temp: 36.4 C   SpO2: 95%     Last Pain:  Vitals:   07/08/18 1146  TempSrc:   PainSc: 0-No pain                 Marke Goodwyn

## 2018-07-10 ENCOUNTER — Other Ambulatory Visit: Payer: Self-pay

## 2018-07-10 ENCOUNTER — Encounter
Admission: RE | Admit: 2018-07-10 | Discharge: 2018-07-10 | Disposition: A | Discharge status: Home / Self Care | Payer: No Typology Code available for payment source | Source: Ambulatory Visit | Attending: Psychiatry | Admitting: Psychiatry

## 2018-07-10 ENCOUNTER — Encounter: Payer: Self-pay | Admitting: Certified Registered"

## 2018-07-10 DIAGNOSIS — R251 Tremor, unspecified: Secondary | ICD-10-CM | POA: Diagnosis not present

## 2018-07-10 DIAGNOSIS — F329 Major depressive disorder, single episode, unspecified: Secondary | ICD-10-CM | POA: Insufficient documentation

## 2018-07-10 DIAGNOSIS — R011 Cardiac murmur, unspecified: Secondary | ICD-10-CM | POA: Insufficient documentation

## 2018-07-10 DIAGNOSIS — F840 Autistic disorder: Secondary | ICD-10-CM | POA: Insufficient documentation

## 2018-07-10 DIAGNOSIS — F419 Anxiety disorder, unspecified: Secondary | ICD-10-CM | POA: Insufficient documentation

## 2018-07-10 DIAGNOSIS — F332 Major depressive disorder, recurrent severe without psychotic features: Secondary | ICD-10-CM

## 2018-07-10 MED ORDER — GLYCOPYRROLATE 0.2 MG/ML IJ SOLN
0.1000 mg | Freq: Once | INTRAMUSCULAR | Status: AC
  Administered 2018-07-10: 0.1 mg via INTRAVENOUS

## 2018-07-10 MED ORDER — METHOHEXITAL SODIUM 100 MG/10ML IV SOSY
PREFILLED_SYRINGE | INTRAVENOUS | Status: DC | PRN
  Administered 2018-07-10: 70 mg via INTRAVENOUS

## 2018-07-10 MED ORDER — MIDAZOLAM HCL 2 MG/2ML IJ SOLN
INTRAMUSCULAR | Status: AC
  Filled 2018-07-10: qty 2

## 2018-07-10 MED ORDER — SUCCINYLCHOLINE CHLORIDE 20 MG/ML IJ SOLN
INTRAMUSCULAR | Status: DC | PRN
  Administered 2018-07-10: 100 mg via INTRAVENOUS

## 2018-07-10 MED ORDER — SODIUM CHLORIDE 0.9 % IV SOLN
500.0000 mL | Freq: Once | INTRAVENOUS | Status: AC
  Administered 2018-07-10: 500 mL via INTRAVENOUS

## 2018-07-10 MED ORDER — MIDAZOLAM HCL 2 MG/2ML IJ SOLN
4.0000 mg | Freq: Once | INTRAMUSCULAR | Status: AC
  Administered 2018-07-10 (×2): 2 mg via INTRAVENOUS

## 2018-07-10 MED ORDER — GLYCOPYRROLATE 0.2 MG/ML IJ SOLN
INTRAMUSCULAR | Status: AC
  Filled 2018-07-10: qty 1

## 2018-07-10 NOTE — Procedures (Signed)
ECT SERVICES Physician's Interval Evaluation & Treatment Note  Patient Identification: Jill Alexander MRN:  [id number] Date of Evaluation:  07/10/2018 TX #: 3  MADRS:   MMSE:   P.E. Findings:  No change to physical exam  Psychiatric Interval Note:  Mood and cognitive function about the same  Subjective:  Patient is a 23 y.o. female seen for evaluation for Electroconvulsive Therapy. Complained of some headache after last treatment  Treatment Summary:   [x]   Right Unilateral             []  Bilateral   % Energy : 1.0 ms 25%   Impedance: 2500 ohms  Seizure Energy Index: 4166 V squared  Postictal Suppression Index: 89%  Seizure Concordance Index: 93%  Medications  Pre Shock: Robinul 0.1 mg Brevital 70 mg succinylcholine 100 mg  Post Shock: Versed 4 mg  Seizure Duration: 37 seconds EMG 140 seconds EEG   Comments: Continue with index course all through next week  Lungs:  [x]   Clear to auscultation               [x]  Other:   Heart:    [x]   Regular rhythm             []  irregular rhythm    [x]   Previous H&P reviewed, patient examined and there are NO CHANGES                 [x]   Previous H&P reviewed, patient examined and there are changes noted.   Mordecai Rasmussen, MD 3/13/202011:22 AM

## 2018-07-10 NOTE — H&P (Signed)
Jill Alexander is an 23 y.o. female.   Chief Complaint: Chronic depression.  No specific complaint today. HPI: History of recurrent depression  Past Medical History:  Diagnosis Date  . Anxiety   . Autism   . Depression   . GERD (gastroesophageal reflux disease)   . Heart murmur   . Seizures (HCC)   . 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 NO PAST SURGERIES    . UPPER GI ENDOSCOPY      Family History  Problem Relation Age of Onset  . Depression Father   . Anxiety disorder Sister   . Bipolar disorder Sister   . Diabetes Maternal Grandfather   . Cancer Maternal Grandfather   . Breast cancer Paternal Grandmother   . Uterine cancer Paternal Grandmother   . Irritable bowel syndrome Paternal Grandmother   . Colon cancer Neg Hx   . Esophageal cancer Neg Hx   . Stomach cancer Neg Hx   . Rectal cancer Neg Hx    Social History:  reports that she has never smoked. She has never used smokeless tobacco. She reports that she does not drink alcohol or use drugs.  Allergies:  Allergies  Allergen Reactions  . Gluten Meal     (Not in a hospital admission)   No results found for this or any previous visit (from the past 48 hour(s)). No results found.  Review of Systems  Constitutional: Negative.   HENT: Negative.   Eyes: Negative.   Respiratory: Negative.   Cardiovascular: Negative.   Gastrointestinal: Negative.   Musculoskeletal: Negative.   Skin: Negative.   Neurological: Negative.   Psychiatric/Behavioral: Negative.     Blood pressure 112/63, pulse 60, temperature 97.8 F (36.6 C), temperature source Oral, SpO2 98 %. Physical Exam  Nursing note and vitals reviewed. Constitutional: She appears well-developed and well-nourished.  HENT:  Head: Normocephalic and atraumatic.  Eyes: Pupils are equal, round, and reactive to light. Conjunctivae are normal.  Neck: Normal range of motion.  Cardiovascular: Regular rhythm  and normal heart sounds.  Respiratory: Effort normal. No respiratory distress.  GI: Soft.  Musculoskeletal: Normal range of motion.  Neurological: She is alert.  Skin: Skin is warm and dry.  Psychiatric: She has a normal mood and affect. Her behavior is normal. Judgment and thought content normal.     Assessment/Plan Continue index course today treatment #3.  Mordecai Rasmussen, MD 07/10/2018, 11:21 AM

## 2018-07-10 NOTE — Discharge Instructions (Signed)
1)  The drugs that you have been given will stay in your system until tomorrow so for the       next 24 hours you should not:  A. Drive an automobile  B. Make any legal decisions  C. Drink any alcoholic beverages  2)  You may resume your regular meals upon return home.  3)  A responsible adult must take you home.  Someone should stay with you for a few          hours, then be available by phone for the remainder of the treatment day.  4)  You May experience any of the following symptoms:  Headache, Nausea and a dry mouth (due to the medications you were given),  temporary memory loss and some confusion, or sore muscles (a warm bath  should help this).  If you you experience any of these symptoms let us know on                your return visit.  5)  Report any of the following: any acute discomfort, severe headache, or temperature        greater than 100.5 F.   Also report any unusual redness, swelling, drainage, or pain         at your IV site.    You may report Symptoms to:  ECT PROGRAM- [place] at [facility]          Phone: [phone], ECT Department           or Dr. Shary Key office [phone]  6)  Your next ECT Treatment is Monday March 16 at 9:00  We will call 2 days prior to your scheduled appointment for arrival times.  7)  Nothing to eat or drink after midnight the night before your procedure.  8)  Take    With a sip of water the morning of your procedure.  9)  Other Instructions: Call [phone] to cancel the morning of your procedure due         to illness or emergency.  10) We will call within 72 hours to assess how you are feeling.

## 2018-07-10 NOTE — Anesthesia Preprocedure Evaluation (Signed)
Anesthesia Evaluation  Patient identified by MRN, date of birth, ID band Patient awake    Reviewed: Allergy & Precautions, NPO status , Patient's Chart, lab work & pertinent test results  History of Anesthesia Complications Negative for: history of anesthetic complications  Airway Mallampati: II  TM Distance: >3 FB Neck ROM: Full    Dental no notable dental hx.    Pulmonary neg pulmonary ROS, neg sleep apnea, neg COPD,    breath sounds clear to auscultation- rhonchi (-) wheezing      Cardiovascular Exercise Tolerance: Good (-) hypertension(-) CAD, (-) Past MI, (-) Cardiac Stents and (-) CABG  Rhythm:Regular Rate:Normal - Systolic murmurs and - Diastolic murmurs    Neuro/Psych Seizures -, Well Controlled,  PSYCHIATRIC DISORDERS Anxiety Depression Bipolar Disorder    GI/Hepatic Neg liver ROS, GERD  ,  Endo/Other  negative endocrine ROSneg diabetes  Renal/GU negative Renal ROS     Musculoskeletal negative musculoskeletal ROS (+)   Abdominal (+) - obese,   Peds  Hematology negative hematology ROS (+)   Anesthesia Other Findings Past Medical History: No date: Anxiety No date: Autism No date: Depression No date: GERD (gastroesophageal reflux disease) No date: Heart murmur No date: Seizures (HCC) 04/09/2017: Tremor, essential   Reproductive/Obstetrics                             Anesthesia Physical Anesthesia Plan  ASA: II  Anesthesia Plan: General   Post-op Pain Management:    Induction: Intravenous  PONV Risk Score and Plan: 2 and Ondansetron  Airway Management Planned: Mask  Additional Equipment:   Intra-op Plan:   Post-operative Plan:   Informed Consent: I have reviewed the patients History and Physical, chart, labs and discussed the procedure including the risks, benefits and alternatives for the proposed anesthesia with the patient or authorized representative who has  indicated his/her understanding and acceptance.     Dental advisory given  Plan Discussed with: CRNA and Anesthesiologist  Anesthesia Plan Comments:         Anesthesia Quick Evaluation  

## 2018-07-10 NOTE — Transfer of Care (Signed)
Immediate Anesthesia Transfer of Care Note  Patient: Jill Alexander  Procedure(s) Performed: ECT TX  Patient Location: PACU  Anesthesia Type:General  Level of Consciousness: awake, alert  and oriented  Airway & Oxygen Therapy: Patient Spontanous Breathing and Patient connected to nasal cannula oxygen  Post-op Assessment: Report given to RN and Post -op Vital signs reviewed and stable  Post vital signs: Reviewed and stable  Last Vitals:  Vitals Value Taken Time  BP    Temp    Pulse    Resp    SpO2      Last Pain:  Vitals:   07/10/18 0919  TempSrc: Oral  PainSc: 0-No pain         Complications: No apparent anesthesia complications

## 2018-07-10 NOTE — Anesthesia Procedure Notes (Signed)
Procedure Name: MAC Date/Time: 07/10/2018 11:26 AM Performed by: Chanetta Marshall, CRNA Pre-anesthesia Checklist: Patient identified, Emergency Drugs available, Suction available, Patient being monitored and Timeout performed Patient Re-evaluated:Patient Re-evaluated prior to induction Oxygen Delivery Method: Circle system utilized Preoxygenation: Pre-oxygenation with 100% oxygen Induction Type: IV induction Ventilation: Mask ventilation without difficulty Airway Equipment and Method: Bite block Dental Injury: Teeth and Oropharynx as per pre-operative assessment

## 2018-07-12 ENCOUNTER — Other Ambulatory Visit: Payer: Self-pay | Admitting: Psychiatry

## 2018-07-12 NOTE — Anesthesia Postprocedure Evaluation (Signed)
Anesthesia Post Note  Patient: Jill Alexander  Procedure(s) Performed: ECT TX  Patient location during evaluation: PACU Anesthesia Type: General Level of consciousness: awake and alert Pain management: pain level controlled Vital Signs Assessment: post-procedure vital signs reviewed and stable Respiratory status: spontaneous breathing, nonlabored ventilation, respiratory function stable and patient connected to nasal cannula oxygen Cardiovascular status: blood pressure returned to baseline and stable Postop Assessment: no apparent nausea or vomiting Anesthetic complications: no     Last Vitals:  Vitals:   07/10/18 1208 07/10/18 1253  BP: 116/65 115/72  Pulse: 88 83  Resp: (!) 23 16  Temp: 36.6 C 36.6 C  SpO2: 98%     Last Pain:  Vitals:   07/10/18 1253  TempSrc: Oral  PainSc: 0-No pain                 Lenard Simmer

## 2018-07-13 ENCOUNTER — Encounter: Payer: Self-pay | Admitting: Anesthesiology

## 2018-07-13 ENCOUNTER — Other Ambulatory Visit: Payer: Self-pay

## 2018-07-13 ENCOUNTER — Ambulatory Visit
Admission: RE | Admit: 2018-07-13 | Discharge: 2018-07-13 | Disposition: A | Discharge status: Home / Self Care | Payer: No Typology Code available for payment source | Source: Ambulatory Visit | Attending: Psychiatry | Admitting: Psychiatry

## 2018-07-13 DIAGNOSIS — F332 Major depressive disorder, recurrent severe without psychotic features: Secondary | ICD-10-CM | POA: Insufficient documentation

## 2018-07-13 DIAGNOSIS — F840 Autistic disorder: Secondary | ICD-10-CM | POA: Diagnosis not present

## 2018-07-13 DIAGNOSIS — R011 Cardiac murmur, unspecified: Secondary | ICD-10-CM | POA: Diagnosis not present

## 2018-07-13 LAB — POCT PREGNANCY, URINE: Preg Test, Ur: NEGATIVE

## 2018-07-13 MED ORDER — SODIUM CHLORIDE 0.9 % IV SOLN
500.0000 mL | Freq: Once | INTRAVENOUS | Status: AC
  Administered 2018-07-13: 500 mL via INTRAVENOUS

## 2018-07-13 MED ORDER — KETOROLAC TROMETHAMINE 30 MG/ML IJ SOLN
30.0000 mg | Freq: Once | INTRAMUSCULAR | Status: AC
  Administered 2018-07-13: 30 mg via INTRAVENOUS

## 2018-07-13 MED ORDER — GLYCOPYRROLATE 0.2 MG/ML IJ SOLN
0.1000 mg | Freq: Once | INTRAMUSCULAR | Status: AC
  Administered 2018-07-13: 0.1 mg via INTRAVENOUS

## 2018-07-13 MED ORDER — MIDAZOLAM HCL 2 MG/2ML IJ SOLN
INTRAMUSCULAR | Status: AC
  Filled 2018-07-13: qty 2

## 2018-07-13 MED ORDER — METHOHEXITAL SODIUM 100 MG/10ML IV SOSY
PREFILLED_SYRINGE | INTRAVENOUS | Status: DC | PRN
  Administered 2018-07-13: 70 mg via INTRAVENOUS

## 2018-07-13 MED ORDER — METHOHEXITAL SODIUM 0.5 G IJ SOLR
INTRAMUSCULAR | Status: AC
  Filled 2018-07-13: qty 500

## 2018-07-13 MED ORDER — MIDAZOLAM HCL 2 MG/2ML IJ SOLN
INTRAMUSCULAR | Status: DC | PRN
  Administered 2018-07-13: 4 mg via INTRAVENOUS

## 2018-07-13 MED ORDER — KETOROLAC TROMETHAMINE 30 MG/ML IJ SOLN
INTRAMUSCULAR | Status: AC
  Administered 2018-07-13: 30 mg via INTRAVENOUS
  Filled 2018-07-13: qty 1

## 2018-07-13 MED ORDER — SUCCINYLCHOLINE CHLORIDE 200 MG/10ML IV SOSY
PREFILLED_SYRINGE | INTRAVENOUS | Status: DC | PRN
  Administered 2018-07-13: 100 mg via INTRAVENOUS

## 2018-07-13 MED ORDER — GLYCOPYRROLATE 0.2 MG/ML IJ SOLN
INTRAMUSCULAR | Status: AC
  Administered 2018-07-13: 0.1 mg via INTRAVENOUS
  Filled 2018-07-13: qty 1

## 2018-07-13 MED ORDER — MIDAZOLAM HCL 2 MG/2ML IJ SOLN: 4.0000 mg | Freq: Once | INTRAMUSCULAR | Status: DC

## 2018-07-13 MED ORDER — SUCCINYLCHOLINE CHLORIDE 20 MG/ML IJ SOLN
INTRAMUSCULAR | Status: AC
  Filled 2018-07-13: qty 1

## 2018-07-13 NOTE — Anesthesia Preprocedure Evaluation (Signed)
Anesthesia Evaluation  Patient identified by MRN, date of birth, ID band Patient awake    Reviewed: Allergy & Precautions, NPO status , Patient's Chart, lab work & pertinent test results  History of Anesthesia Complications Negative for: history of anesthetic complications  Airway Mallampati: II  TM Distance: >3 FB Neck ROM: Full    Dental no notable dental hx.    Pulmonary neg pulmonary ROS, neg sleep apnea, neg COPD,    breath sounds clear to auscultation- rhonchi (-) wheezing      Cardiovascular Exercise Tolerance: Good (-) hypertension(-) CAD, (-) Past MI, (-) Cardiac Stents and (-) CABG  Rhythm:Regular Rate:Normal - Systolic murmurs and - Diastolic murmurs    Neuro/Psych Seizures -, Well Controlled,  PSYCHIATRIC DISORDERS Anxiety Depression Bipolar Disorder    GI/Hepatic Neg liver ROS, GERD  ,  Endo/Other  negative endocrine ROSneg diabetes  Renal/GU negative Renal ROS     Musculoskeletal negative musculoskeletal ROS (+)   Abdominal (+) - obese,   Peds  Hematology negative hematology ROS (+)   Anesthesia Other Findings Past Medical History: No date: Anxiety No date: Autism No date: Depression No date: GERD (gastroesophageal reflux disease) No date: Heart murmur No date: Seizures (HCC) 04/09/2017: Tremor, essential   Reproductive/Obstetrics                             Anesthesia Physical Anesthesia Plan  ASA: II  Anesthesia Plan: General   Post-op Pain Management:    Induction: Intravenous  PONV Risk Score and Plan: 2 and Ondansetron  Airway Management Planned: Mask  Additional Equipment:   Intra-op Plan:   Post-operative Plan:   Informed Consent: I have reviewed the patients History and Physical, chart, labs and discussed the procedure including the risks, benefits and alternatives for the proposed anesthesia with the patient or authorized representative who has  indicated his/her understanding and acceptance.     Dental advisory given  Plan Discussed with: CRNA and Anesthesiologist  Anesthesia Plan Comments:         Anesthesia Quick Evaluation  

## 2018-07-13 NOTE — H&P (Signed)
Jill Alexander is an 23 y.o. female.   Chief Complaint: Patient has no new complaints.  A little bit of memory impairment.  Slightly more energetic from what it sounds HPI: History of severe recurrent depression  Past Medical History:  Diagnosis Date  . Anxiety   . Autism   . Depression   . GERD (gastroesophageal reflux disease)   . Heart murmur   . Seizures (HCC)   . 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 NO PAST SURGERIES    . UPPER GI ENDOSCOPY      Family History  Problem Relation Age of Onset  . Depression Father   . Anxiety disorder Sister   . Bipolar disorder Sister   . Diabetes Maternal Grandfather   . Cancer Maternal Grandfather   . Breast cancer Paternal Grandmother   . Uterine cancer Paternal Grandmother   . Irritable bowel syndrome Paternal Grandmother   . Colon cancer Neg Hx   . Esophageal cancer Neg Hx   . Stomach cancer Neg Hx   . Rectal cancer Neg Hx    Social History:  reports that she has never smoked. She has never used smokeless tobacco. She reports that she does not drink alcohol or use drugs.  Allergies:  Allergies  Allergen Reactions  . Gluten Meal     (Not in a hospital admission)   No results found for this or any previous visit (from the past 48 hour(s)). No results found.  Review of Systems  Constitutional: Negative.   HENT: Negative.   Eyes: Negative.   Respiratory: Negative.   Cardiovascular: Negative.   Gastrointestinal: Negative.   Musculoskeletal: Negative.   Skin: Negative.   Neurological: Negative.   Psychiatric/Behavioral: Positive for depression and memory loss. Negative for hallucinations, substance abuse and suicidal ideas. The patient is not nervous/anxious and does not have insomnia.     Blood pressure 111/66, pulse 62, temperature 97.8 F (36.6 C), temperature source Oral, resp. rate 16, SpO2 98 %. Physical Exam  Nursing note and vitals  reviewed. Constitutional: She appears well-developed and well-nourished.  HENT:  Head: Normocephalic and atraumatic.  Eyes: Pupils are equal, round, and reactive to light. Conjunctivae are normal.  Neck: Normal range of motion.  Cardiovascular: Normal heart sounds.  Respiratory: Effort normal.  GI: Soft.  Musculoskeletal: Normal range of motion.  Neurological: She is alert.  Skin: Skin is warm and dry.  Psychiatric: Judgment normal. Her affect is blunt. Her speech is delayed. She is slowed. Thought content is not paranoid. She expresses no suicidal ideation. She exhibits abnormal recent memory.     Assessment/Plan Continue treatment plan with right unilateral ECT treatment 3 times a week index course going forward.  Mordecai Rasmussen, MD 07/13/2018, 10:21 AM

## 2018-07-13 NOTE — Discharge Instructions (Signed)
1)  The drugs that you have been given will stay in your system until tomorrow so for the       next 24 hours you should not:  A. Drive an automobile  B. Make any legal decisions  C. Drink any alcoholic beverages  2)  You may resume your regular meals upon return home.  3)  A responsible adult must take you home.  Someone should stay with you for a few          hours, then be available by phone for the remainder of the treatment day.  4)  You May experience any of the following symptoms:  Headache, Nausea and a dry mouth (due to the medications you were given),  temporary memory loss and some confusion, or sore muscles (a warm bath  should help this).  If you you experience any of these symptoms let us know on                your return visit.  5)  Report any of the following: any acute discomfort, severe headache, or temperature        greater than 100.5 F.   Also report any unusual redness, swelling, drainage, or pain         at your IV site.    You may report Symptoms to:  ECT PROGRAM- [place] at [facility]          Phone: [phone], ECT Department           or Dr. Shary Key office [phone]  6)  Your next ECT Treatment is Wednesday March 18 at 9:00  We will call 2 days prior to your scheduled appointment for arrival times.  7)  Nothing to eat or drink after midnight the night before your procedure.  8)  Take      With a sip of water the morning of your procedure.  9)  Other Instructions: Call [phone] to cancel the morning of your procedure due         to illness or emergency.  10) We will call within 72 hours to assess how you are feeling.

## 2018-07-13 NOTE — Anesthesia Procedure Notes (Signed)
Date/Time: 07/13/2018 10:31 AM Performed by: Lily Kocher, CRNA Pre-anesthesia Checklist: Patient identified, Emergency Drugs available, Suction available and Patient being monitored Patient Re-evaluated:Patient Re-evaluated prior to induction Oxygen Delivery Method: Circle system utilized Preoxygenation: Pre-oxygenation with 100% oxygen Induction Type: IV induction Ventilation: Mask ventilation without difficulty and Mask ventilation throughout procedure Airway Equipment and Method: Bite block Placement Confirmation: positive ETCO2 Dental Injury: Teeth and Oropharynx as per pre-operative assessment

## 2018-07-13 NOTE — Anesthesia Post-op Follow-up Note (Signed)
Anesthesia QCDR form completed.        

## 2018-07-13 NOTE — Procedures (Signed)
ECT SERVICES Physician's Interval Evaluation & Treatment Note  Patient Identification: Jill Alexander MRN:  [id number] Date of Evaluation:  07/13/2018 TX #: 4  MADRS: 20  MMSE:  30 P.E. Findings:  No change in physical exam  Psychiatric Interval Note:  Seems to be probably more energetic alert and interactive  Subjective:  Patient is a 23 y.o. female seen for evaluation for Electroconvulsive Therapy. Noticing slight memory impairment  Treatment Summary:   [x]   Right Unilateral             []  Bilateral   % Energy : 0.3 ms 25%   Impedance: 2050 ohms  Seizure Energy Index: 5180 V squared  Postictal Suppression Index: 27%  Seizure Concordance Index: 89%  Medications  Pre Shock: Robinul 0.1 mg Toradol 30 mg Brevital 70 mg succinylcholine 100 mg  Post Shock: Versed 4 mg  Seizure Duration: 44 seconds EMG 196 seconds EEG   Comments: Follow-up Wednesday Friday  Lungs:  [x]   Clear to auscultation               []  Other:   Heart:    [x]   Regular rhythm             []  irregular rhythm    [x]   Previous H&P reviewed, patient examined and there are NO CHANGES                 []   Previous H&P reviewed, patient examined and there are changes noted.   Mordecai Rasmussen, MD 3/16/202010:22 AM

## 2018-07-13 NOTE — Anesthesia Postprocedure Evaluation (Signed)
Anesthesia Post Note  Patient: Jill Alexander  Procedure(s) Performed: ECT TX  Patient location during evaluation: PACU Anesthesia Type: General Level of consciousness: awake and alert Pain management: pain level controlled Vital Signs Assessment: post-procedure vital signs reviewed and stable Respiratory status: spontaneous breathing, nonlabored ventilation and respiratory function stable Cardiovascular status: blood pressure returned to baseline and stable Postop Assessment: no signs of nausea or vomiting Anesthetic complications: no     Last Vitals:  Vitals:   07/13/18 1125 07/13/18 1135  BP: (!) 104/57 109/60  Pulse: 73 64  Resp: 20 20  Temp:  36.8 C  SpO2: 100% 100%    Last Pain:  Vitals:   07/13/18 1135  TempSrc:   PainSc: 0-No pain                 Tequisha Maahs

## 2018-07-14 ENCOUNTER — Other Ambulatory Visit: Payer: Self-pay | Admitting: Psychiatry

## 2018-07-14 ENCOUNTER — Ambulatory Visit: Payer: Medicaid Other | Admitting: Clinical

## 2018-07-15 ENCOUNTER — Other Ambulatory Visit: Payer: Self-pay

## 2018-07-15 ENCOUNTER — Ambulatory Visit
Admission: RE | Admit: 2018-07-15 | Discharge: 2018-07-15 | Disposition: A | Discharge status: Home / Self Care | Payer: No Typology Code available for payment source | Source: Ambulatory Visit | Attending: Psychiatry | Admitting: Psychiatry

## 2018-07-15 ENCOUNTER — Encounter: Payer: Self-pay | Admitting: Anesthesiology

## 2018-07-15 DIAGNOSIS — F840 Autistic disorder: Secondary | ICD-10-CM | POA: Diagnosis not present

## 2018-07-15 DIAGNOSIS — Z818 Family history of other mental and behavioral disorders: Secondary | ICD-10-CM | POA: Insufficient documentation

## 2018-07-15 DIAGNOSIS — Z803 Family history of malignant neoplasm of breast: Secondary | ICD-10-CM | POA: Diagnosis not present

## 2018-07-15 DIAGNOSIS — Z809 Family history of malignant neoplasm, unspecified: Secondary | ICD-10-CM | POA: Insufficient documentation

## 2018-07-15 DIAGNOSIS — F332 Major depressive disorder, recurrent severe without psychotic features: Secondary | ICD-10-CM

## 2018-07-15 DIAGNOSIS — F329 Major depressive disorder, single episode, unspecified: Secondary | ICD-10-CM | POA: Insufficient documentation

## 2018-07-15 DIAGNOSIS — Z91018 Allergy to other foods: Secondary | ICD-10-CM | POA: Diagnosis not present

## 2018-07-15 DIAGNOSIS — Z8059 Family history of malignant neoplasm of other urinary tract organ: Secondary | ICD-10-CM | POA: Insufficient documentation

## 2018-07-15 DIAGNOSIS — Z833 Family history of diabetes mellitus: Secondary | ICD-10-CM | POA: Diagnosis not present

## 2018-07-15 MED ORDER — GLYCOPYRROLATE 0.2 MG/ML IJ SOLN
INTRAMUSCULAR | Status: AC
  Administered 2018-07-15: 0.1 mg via INTRAVENOUS
  Filled 2018-07-15: qty 1

## 2018-07-15 MED ORDER — METHOHEXITAL SODIUM 0.5 G IJ SOLR
INTRAMUSCULAR | Status: AC
  Filled 2018-07-15: qty 500

## 2018-07-15 MED ORDER — GLYCOPYRROLATE 0.2 MG/ML IJ SOLN
0.1000 mg | Freq: Once | INTRAMUSCULAR | Status: AC
  Administered 2018-07-15: 0.1 mg via INTRAVENOUS

## 2018-07-15 MED ORDER — SUCCINYLCHOLINE CHLORIDE 200 MG/10ML IV SOSY
PREFILLED_SYRINGE | INTRAVENOUS | Status: DC | PRN
  Administered 2018-07-15: 100 mg via INTRAVENOUS

## 2018-07-15 MED ORDER — SODIUM CHLORIDE 0.9 % IV SOLN
500.0000 mL | Freq: Once | INTRAVENOUS | Status: AC
  Administered 2018-07-15: 500 mL via INTRAVENOUS

## 2018-07-15 MED ORDER — METHOHEXITAL SODIUM 100 MG/10ML IV SOSY
PREFILLED_SYRINGE | INTRAVENOUS | Status: DC | PRN
  Administered 2018-07-15: 70 mg via INTRAVENOUS

## 2018-07-15 MED ORDER — SODIUM CHLORIDE 0.9 % IV SOLN
INTRAVENOUS | Status: DC | PRN
  Administered 2018-07-15: 09:00:00 via INTRAVENOUS

## 2018-07-15 MED ORDER — MIDAZOLAM HCL 2 MG/2ML IJ SOLN
INTRAMUSCULAR | Status: AC
  Filled 2018-07-15: qty 4

## 2018-07-15 MED ORDER — ONDANSETRON HCL 4 MG/2ML IJ SOLN: 4.0000 mg | Freq: Once | INTRAMUSCULAR | Status: DC | PRN

## 2018-07-15 MED ORDER — KETOROLAC TROMETHAMINE 30 MG/ML IJ SOLN
30.0000 mg | Freq: Once | INTRAMUSCULAR | Status: AC
  Administered 2018-07-15: 30 mg via INTRAVENOUS

## 2018-07-15 MED ORDER — FENTANYL CITRATE (PF) 100 MCG/2ML IJ SOLN: 25.0000 ug | INTRAMUSCULAR | Status: DC | PRN

## 2018-07-15 MED ORDER — KETOROLAC TROMETHAMINE 30 MG/ML IJ SOLN
INTRAMUSCULAR | Status: AC
  Administered 2018-07-15: 30 mg via INTRAVENOUS
  Filled 2018-07-15: qty 1

## 2018-07-15 MED ORDER — MIDAZOLAM HCL 2 MG/2ML IJ SOLN: 3.0000 mg | Freq: Once | INTRAMUSCULAR | Status: DC

## 2018-07-15 NOTE — Anesthesia Post-op Follow-up Note (Signed)
Anesthesia QCDR form completed.        

## 2018-07-15 NOTE — Discharge Instructions (Signed)
1)  The drugs that you have been given will stay in your system until tomorrow so for the       next 24 hours you should not:  A. Drive an automobile  B. Make any legal decisions  C. Drink any alcoholic beverages  2)  You may resume your regular meals upon return home.  3)  A responsible adult must take you home.  Someone should stay with you for a few          hours, then be available by phone for the remainder of the treatment day.  4)  You May experience any of the following symptoms:  Headache, Nausea and a dry mouth (due to the medications you were given),  temporary memory loss and some confusion, or sore muscles (a warm bath  should help this).  If you you experience any of these symptoms let us know on                your return visit.  5)  Report any of the following: any acute discomfort, severe headache, or temperature        greater than 100.5 F.   Also report any unusual redness, swelling, drainage, or pain         at your IV site.    You may report Symptoms to:  ECT PROGRAM- [place] [facility]          Phone: [phone], ECT Department           or Dr. Shary Key office [phone]  6)  Your next ECT Treatment is Friday March 20 at 8:30   We will call 2 days prior to your scheduled appointment for arrival times.  7)  Nothing to eat or drink after midnight the night before your procedure.  8)  Take     With a sip of water the morning of your procedure.  9)  Other Instructions: Call [phone] to cancel the morning of your procedure due         to illness or emergency.  10) We will call within 72 hours to assess how you are feeling.

## 2018-07-15 NOTE — Procedures (Signed)
ECT SERVICES Physician's Interval Evaluation & Treatment Note  Patient Identification: Jill Alexander MRN:  [id number] Date of Evaluation:  07/15/2018 TX #: 5  MADRS:   MMSE:   P.E. Findings:  No change to physical exam  Psychiatric Interval Note:  Patient seems to be brighter more interactive.  Better able to laugh and show appropriate emotional response.  More appropriately talkative.  Subjective:  Patient is a 23 y.o. female seen for evaluation for Electroconvulsive Therapy. Patient has no complaint.  Minor awareness of memory problems  Treatment Summary:   [x]   Right Unilateral             []  Bilateral   % Energy : 0.3 ms 25%   Impedance: 1320 ohms  Seizure Energy Index: 4385 V squared  Postictal Suppression Index: 89%  Seizure Concordance Index: 97%  Medications  Pre Shock: Robinul 0.1 mg Toradol 30 mg Brevital 70 mg succinylcholine 100 mg  Post Shock: Versed 3 mg  Seizure Duration: 123 seconds by EMG although actually the motor seizure was probably shorter than that.  140 seconds by EEG   Comments: We are going to see her again on Friday which will be treatment #6 after which I anticipate stopping treatment temporarily at least.  Lungs:  [x]   Clear to auscultation               []  Other:   Heart:    [x]   Regular rhythm             []  irregular rhythm    [x]   Previous H&P reviewed, patient examined and there are NO CHANGES                 []   Previous H&P reviewed, patient examined and there are changes noted.   Mordecai Rasmussen, MD 3/18/20203:00 PM

## 2018-07-15 NOTE — Anesthesia Postprocedure Evaluation (Signed)
Anesthesia Post Note  Patient: Jill Alexander  Procedure(s) Performed: ECT TX  Patient location during evaluation: PACU Anesthesia Type: General Level of consciousness: awake and alert Pain management: pain level controlled Vital Signs Assessment: post-procedure vital signs reviewed and stable Respiratory status: spontaneous breathing, nonlabored ventilation, respiratory function stable and patient connected to nasal cannula oxygen Cardiovascular status: blood pressure returned to baseline and stable Postop Assessment: no apparent nausea or vomiting Anesthetic complications: no     Last Vitals:  Vitals:   07/15/18 1120 07/15/18 1144  BP: 106/64 120/68  Pulse: 78 69  Resp: 20 16  Temp: 36.8 C 36.8 C  SpO2: 100%     Last Pain:  Vitals:   07/15/18 1144  TempSrc: Oral  PainSc: 0-No pain                 Geneal Huebert S

## 2018-07-15 NOTE — H&P (Signed)
Jill Alexander is an 23 y.o. female.   Chief Complaint: Follow-up for this 23 year old woman with autistic spectrum disorder who is receiving ECT for depression.  Patient has no new complaint today.  She acknowledges that her mood is a little bit better. HPI: Recurrent severe depression with minimal response to prior medicine  Past Medical History:  Diagnosis Date  . Anxiety   . Autism   . Depression   . GERD (gastroesophageal reflux disease)   . Heart murmur   . Seizures (HCC)   . 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 NO PAST SURGERIES    . UPPER GI ENDOSCOPY      Family History  Problem Relation Age of Onset  . Depression Father   . Anxiety disorder Sister   . Bipolar disorder Sister   . Diabetes Maternal Grandfather   . Cancer Maternal Grandfather   . Breast cancer Paternal Grandmother   . Uterine cancer Paternal Grandmother   . Irritable bowel syndrome Paternal Grandmother   . Colon cancer Neg Hx   . Esophageal cancer Neg Hx   . Stomach cancer Neg Hx   . Rectal cancer Neg Hx    Social History:  reports that she has never smoked. She has never used smokeless tobacco. She reports that she does not drink alcohol or use drugs.  Allergies:  Allergies  Allergen Reactions  . Gluten Meal     (Not in a hospital admission)   Results for orders placed or performed during the hospital encounter of 07/13/18 (from the past 48 hour(s))  Pregnancy, urine POC     Status: None   Collection Time: 07/13/18  3:51 PM  Result Value Ref Range   Preg Test, Ur NEGATIVE NEGATIVE    Comment:        THE SENSITIVITY OF THIS METHODOLOGY IS >24 mIU/mL    No results found.  Review of Systems  Constitutional: Negative.   HENT: Negative.   Eyes: Negative.   Respiratory: Negative.   Cardiovascular: Negative.   Gastrointestinal: Negative.   Musculoskeletal: Negative.   Skin: Negative.   Neurological: Negative.    Psychiatric/Behavioral: Positive for memory loss. Negative for depression and suicidal ideas.    Blood pressure 120/68, pulse 69, temperature 98.2 F (36.8 C), temperature source Oral, resp. rate 16, SpO2 100 %. Physical Exam  Nursing note and vitals reviewed. Constitutional: She appears well-developed and well-nourished.  HENT:  Head: Normocephalic and atraumatic.  Eyes: Pupils are equal, round, and reactive to light. Conjunctivae are normal.  Neck: Normal range of motion.  Cardiovascular: Regular rhythm and normal heart sounds.  Respiratory: Effort normal.  GI: Soft.  Musculoskeletal: Normal range of motion.  Neurological: She is alert.  Skin: Skin is warm and dry.  Psychiatric: She has a normal mood and affect. Her behavior is normal. Judgment and thought content normal.     Assessment/Plan Patient appears to be showing some real improvement with the ECT course.  Little to no in the way of side effect.  I propose that we try to finish up 6 treatments by doing today and tomorrow and then reassess particularly in light of the current pandemic situation possibly to end the index course at that point.  Patient agreeable.  Jill Rasmussen, MD 07/15/2018, 2:59 PM

## 2018-07-15 NOTE — Anesthesia Procedure Notes (Signed)
Date/Time: 07/15/2018 10:27 AM Performed by: Lily Kocher, CRNA Pre-anesthesia Checklist: Patient identified, Emergency Drugs available, Suction available and Patient being monitored Patient Re-evaluated:Patient Re-evaluated prior to induction Oxygen Delivery Method: Circle system utilized Preoxygenation: Pre-oxygenation with 100% oxygen Induction Type: IV induction Ventilation: Mask ventilation without difficulty and Mask ventilation throughout procedure Airway Equipment and Method: Bite block Placement Confirmation: positive ETCO2 Dental Injury: Teeth and Oropharynx as per pre-operative assessment

## 2018-07-15 NOTE — Transfer of Care (Signed)
Immediate Anesthesia Transfer of Care Note  Patient: Jill Alexander  Procedure(s) Performed: ECT TX  Patient Location: PACU  Anesthesia Type:General  Level of Consciousness: sedated  Airway & Oxygen Therapy: Patient Spontanous Breathing and Patient connected to face mask oxygen  Post-op Assessment: Report given to RN and Post -op Vital signs reviewed and stable  Post vital signs: Reviewed and stable  Last Vitals:  Vitals Value Taken Time  BP 102/52 07/15/2018 10:40 AM  Temp    Pulse 70 07/15/2018 10:42 AM  Resp 19 07/15/2018 10:42 AM  SpO2 100 % 07/15/2018 10:42 AM  Vitals shown include unvalidated device data.  Last Pain:  Vitals:   07/15/18 0929  TempSrc:   PainSc: 0-No pain         Complications: No apparent anesthesia complications

## 2018-07-15 NOTE — Anesthesia Preprocedure Evaluation (Signed)
Anesthesia Evaluation  Patient identified by MRN, date of birth, ID band Patient awake    Reviewed: Allergy & Precautions, NPO status , Patient's Chart, lab work & pertinent test results, reviewed documented beta blocker date and time   Airway Mallampati: II  TM Distance: >3 FB     Dental  (+) Chipped   Pulmonary           Cardiovascular + Valvular Problems/Murmurs      Neuro/Psych Seizures -,  PSYCHIATRIC DISORDERS Anxiety Depression Bipolar Disorder    GI/Hepatic GERD  Controlled,  Endo/Other    Renal/GU      Musculoskeletal   Abdominal   Peds  Hematology   Anesthesia Other Findings Autism. Has some tremor. EF 60-65.  Reproductive/Obstetrics                             Anesthesia Physical Anesthesia Plan  ASA: III  Anesthesia Plan: General   Post-op Pain Management:    Induction: Intravenous  PONV Risk Score and Plan:   Airway Management Planned:   Additional Equipment:   Intra-op Plan:   Post-operative Plan:   Informed Consent: I have reviewed the patients History and Physical, chart, labs and discussed the procedure including the risks, benefits and alternatives for the proposed anesthesia with the patient or authorized representative who has indicated his/her understanding and acceptance.       Plan Discussed with: CRNA  Anesthesia Plan Comments:         Anesthesia Quick Evaluation

## 2018-07-16 ENCOUNTER — Ambulatory Visit: Payer: Medicaid Other | Admitting: Clinical

## 2018-07-16 ENCOUNTER — Ambulatory Visit (INDEPENDENT_AMBULATORY_CARE_PROVIDER_SITE_OTHER): Payer: No Typology Code available for payment source | Admitting: Psychiatry

## 2018-07-16 ENCOUNTER — Other Ambulatory Visit: Payer: Self-pay

## 2018-07-16 ENCOUNTER — Encounter (HOSPITAL_COMMUNITY): Payer: Self-pay | Admitting: Psychiatry

## 2018-07-16 DIAGNOSIS — R5383 Other fatigue: Secondary | ICD-10-CM

## 2018-07-16 DIAGNOSIS — F0630 Mood disorder due to known physiological condition, unspecified: Secondary | ICD-10-CM | POA: Diagnosis not present

## 2018-07-16 DIAGNOSIS — F410 Panic disorder [episodic paroxysmal anxiety] without agoraphobia: Secondary | ICD-10-CM

## 2018-07-16 DIAGNOSIS — R5381 Other malaise: Secondary | ICD-10-CM

## 2018-07-16 DIAGNOSIS — F3181 Bipolar II disorder: Secondary | ICD-10-CM | POA: Diagnosis not present

## 2018-07-16 MED ORDER — SELEGILINE 6 MG/24HR TD PT24: 6.0000 mg | MEDICATED_PATCH | Freq: Every day | TRANSDERMAL | 12 refills | Status: DC

## 2018-07-16 MED ORDER — LORAZEPAM 2 MG PO TABS: 2.0000 mg | ORAL_TABLET | Freq: Two times a day (BID) | ORAL | 1 refills | Status: DC

## 2018-07-16 MED ORDER — LAMOTRIGINE 150 MG PO TABS: 150.0000 mg | ORAL_TABLET | Freq: Two times a day (BID) | ORAL | 1 refills | Status: DC

## 2018-07-16 MED ORDER — CARIPRAZINE HCL 4.5 MG PO CAPS: 4.5000 mg | ORAL_CAPSULE | Freq: Every day | ORAL | 1 refills | Status: DC

## 2018-07-16 NOTE — Progress Notes (Signed)
BH MD/PA/NP OP Progress Note  07/16/2018 2:58 PM Jill Alexander  MRN:  [id number]  Chief Complaint:  Chief Complaint    Depression; Anxiety     HPI: Jill Alexander is here with her mother.  Mother states that Jill Alexander has completed 5 ECT treatments and will have another one tomorrow.  She has been tolerating the procedures well.  She complains of headache and there is some apparent short-term memory loss which they expected.  Mom feels as though Jill Alexander is showing signs of improvement.  She seems less anxious and is not sending as many emails or text messages to her mother throughout the day.  She also seems a little bit less depressed as she has not spoken of a lot of negative things the way she was before.  Jill Alexander tells me that she does feel a little better, less depressed on, but was unable to give any specifics.  She is fatigued and is sleeping well.  She is concerned about the recurrent pandemic with the coronavirus.  Jill Alexander is denying SI/HI.  Visit Diagnosis:    ICD-10-CM   1. Mood disorder in conditions classified elsewhere F06.30 lamoTRIgine (LAMICTAL) 150 MG tablet    selegiline (EMSAM) 6 MG/24HR    Cariprazine HCl 4.5 MG CAPS  2. Bipolar 2 disorder (HCC) F31.81 lamoTRIgine (LAMICTAL) 150 MG tablet    Cariprazine HCl 4.5 MG CAPS  3. Panic attacks F41.0 LORazepam (ATIVAN) 2 MG tablet      Past Psychiatric History: Patient denies any history of prior psychiatric hospitalizations.  She was treated with medications in the past-Lamictal, Abilify- flat and lethargic, Wellbutrin, Prozac, Ativan, Lithium.  Prior to coming to [facility] outpatient patient was receiving treatment at [facility]. In [place] pt worked with one psychiatrist for over 10 yrs. Pt denies any hx of previous suicide attempts.   Past Medical History:  Past Medical History:  Diagnosis Date  . Anxiety   . Autism   . Depression   . GERD (gastroesophageal reflux disease)   . Heart murmur   . Seizures (HCC)   . Tremor, essential  04/09/2017    Past Surgical History:  Procedure Laterality Date  . NO PAST SURGERIES    . UPPER GI ENDOSCOPY      Family Psychiatric  History:  Family History  Problem Relation Age of Onset  . Depression Father   . Anxiety disorder Sister   . Bipolar disorder Sister   . Diabetes Maternal Grandfather   . Cancer Maternal Grandfather   . Breast cancer Paternal Grandmother   . Uterine cancer Paternal Grandmother   . Irritable bowel syndrome Paternal Grandmother   . Colon cancer Neg Hx   . Esophageal cancer Neg Hx   . Stomach cancer Neg Hx   . Rectal cancer Neg Hx     Social History:  Social History   Socioeconomic History  . Marital status: Single    Spouse name: Not on file  . Number of children: 0  . Years of education: Not on file  . Highest education level: Some college, no degree  Occupational History  . Not on file  Social Needs  . Financial resource strain: Not hard at all  . Food insecurity:    Worry: Never true    Inability: Never true  . Transportation needs:    Medical: Yes    Non-medical: Yes  Tobacco Use  . Smoking status: Never Smoker  . Smokeless tobacco: Never Used  Substance and Sexual Activity  . Alcohol use:  No  . Drug use: No  . Sexual activity: Not Currently  Lifestyle  . Physical activity:    Days per week: 3 days    Minutes per session: 30 min  . Stress: Rather much  Relationships  . Social connections:    Talks on phone: Never    Gets together: More than three times a week    Attends religious service: 1 to 4 times per year    Active member of club or organization: No    Attends meetings of clubs or organizations: Never    Relationship status: Never married  Other Topics Concern  . Not on file  Social History Narrative   Lives with parents in [place]. Pt moved here from [place] in 2018.   Caffeine use: 2-3 cups per day   Left handed     Allergies:  Allergies  Allergen Reactions  . Gluten Meal     Metabolic Disorder  Labs: No results found for: HGBA1C, MPG No results found for: PROLACTIN No results found for: CHOL, TRIG, HDL, CHOLHDL, VLDL, LDLCALC Lab Results  Component Value Date   TSH 1.340 11/17/2017   TSH 0.677 08/21/2017    Therapeutic Level Labs: Lab Results  Component Value Date   LITHIUM 0.8 01/05/2018   LITHIUM 0.5 (L) 12/18/2017   No results found for: VALPROATE No components found for:  CBMZ  Current Medications: Current Outpatient Medications  Medication Sig Dispense Refill  . acetaminophen (TYLENOL) 500 MG tablet Take 500 mg by mouth every 6 (six) hours as needed.    . Cariprazine HCl 4.5 MG CAPS Take 1 capsule (4.5 mg total) by mouth daily. 30 capsule 1  . Ibuprofen (MOTRIN PO) Take 200 mg by mouth as needed.     . lamoTRIgine (LAMICTAL) 150 MG tablet Take 1 tablet (150 mg total) by mouth 2 (two) times daily. 60 tablet 1  . LORazepam (ATIVAN) 2 MG tablet Take 1 tablet (2 mg total) by mouth 2 (two) times daily. 60 tablet 1  . Multiple Vitamins-Minerals (MULTIVITAMIN PO) Take 1 tablet by mouth daily.    . Norethin Ace-Eth Estrad-FE (TAYTULLA) 1-20 MG-MCG(24) CAPS Take 1 tablet by mouth daily. 28 capsule 6  . omeprazole (PRILOSEC) 40 MG capsule Take 1 capsule (40 mg total) by mouth 2 (two) times daily. 60 capsule 1  . ondansetron (ZOFRAN ODT) 8 MG disintegrating tablet Take 1 tablet (8 mg total) by mouth 2 (two) times daily. 60 tablet 3  . polyethylene glycol (MIRALAX) packet Take 17 g by mouth daily as needed. (Patient taking differently: Take 17 g by mouth as needed. ) 14 each 0  . selegiline (EMSAM) 6 MG/24HR Place 1 patch (6 mg total) onto the skin daily. 30 patch 12   No current facility-administered medications for this visit.      Musculoskeletal: Strength & Muscle Tone: within normal limits Gait & Station: normal Patient leans: N/A  Psychiatric Specialty Exam: Review of Systems  Constitutional: Positive for malaise/fatigue. Negative for chills and fever.   Respiratory: Negative for cough, sputum production and shortness of breath.   Gastrointestinal: Negative for diarrhea, nausea and vomiting.    Blood pressure 101/63, pulse 67, temperature 98.7 F (37.1 C), resp. rate 16, weight 141 lb 12.8 oz (64.3 kg).Body mass index is 25.94 kg/m.  General Appearance: Casual  Eye Contact:  tended to stare  Speech:  Clear and Coherent and Normal Rate  Volume:  Normal  Mood:  Depressed  Affect:  Flat  Thought Process:  Goal Directed and Descriptions of Associations: Intact  Orientation:  Full (Time, Place, and Person)  Thought Content:  Logical, gave minimal responses to questions  Suicidal Thoughts:  No  Homicidal Thoughts:  No  Memory:  Immediate;   Poor  Judgement:  Fair  Insight:  Fair  Psychomotor Activity:  Normal  Concentration:  Concentration: Fair  Recall:  Poor  Fund of Knowledge:  Good  Language:  Good  Akathisia:  No  Handed:  Right  AIMS (if indicated):     Assets:  Communication Skills Desire for Improvement Financial Resources/Insurance Housing Social Support  ADL's:  Intact  Cognition:  WNL  Sleep:   good           Screenings: ECT-MADRS     ECT Treatment from 07[facility]13/2018 in [facility] ECT Treatment from 07/06/2018 in [facility]  MADRS Total Score  20  37    GAD-7     Counselor from 06/01/2018 in BEHAVIORAL HEALTH PARTIAL HOSPITALIZATION PROGRAM Counselor from 05/22/2018 in BEHAVIORAL HEALTH PARTIAL HOSPITALIZATION PROGRAM Office Visit from 01/15/2018 [facility] Office Visit from 12[facility]17/2017 in [facility] Office Visit from 08/21/2017 in [facility]  Total GAD-7 Score  Mini-Mental     ECT Treatment from 07/13/2018 in [facility] ECT Treatment from 07/06/2018 in [facility]  Total Score (max 30 points  )  30  30    PHQ2-9     Counselor from 06/01/2018 in BEHAVIORAL HEALTH PARTIAL HOSPITALIZATION PROGRAM Counselor from 05/22/2018 in BEHAVIORAL HEALTH PARTIAL HOSPITALIZATION PROGRAM Office Visit from 01/15/2018 in [facility] Office Visit from 12/17/2017 in [facility] Office Visit from 08/21/2017 in [facility]  PHQ-2 Total Score  PHQ-9 Total Score  I reviewed the information below on 07/16/2018 and have updated it Assessment and Plan: Mood disorder NOS versus bipolar 2 disorder-current episode depressed; seizure disorder; autism spectrum    Medication management with supportive therapy. Risks and benefits, side effects and alternative treatment options discussed with patient. Pt was given an opportunity to ask questions about medication, illness, and treatment. All current psychiatric medications have been reviewed and discussed with the patient and adjusted as clinically appropriate. The patient has been provided an accurate and updated list of the medications being now prescribed. Pt verbalized understanding and verbal consent obtained for treatment.  The risk of un-intended pregnancy is low based on the fact that pt reports she is using daily OCP. Pt is aware that these meds carry a teratogenic risk. Pt will discuss plan of action if she does or plans to become pregnant in the future.  Status of current problems: Mild improvement in depression and anxiety  Meds: Cariprazine 4.5 mg p.o. daily Lamictal 150 mg p.o. twice daily Ativan  po BID prn anxiety/mood swings Emsam patch  to skin daily Continue current meds  Labs: none  Therapy: brief supportive therapy provided. Discussed psychosocial stressors in detail.  - talked about showerless wipes as option for good hygeine  Consultations: Encouraged to follow up with therapist-Dr. Bryna Colander reports that she has an  appointment scheduled for next week she is aware that  it may be by phone Starting DBT thru [facility] (referred by therapist) at [facility]--it is yet to start Supported employment starting in March? Encouraged to follow up with PCP as needed Referred for ECT-patient has now completed 5 sessions.  Her next ECT will occur tomorrow.  She will then have a 2-week break and then resume ECT treatments  Pt's acute risk factors for suicide are ongoing depression with on/off passive SI. Pt's chronic risk factors are chronic mental illness. Pt's protective factors are social support. Pt denies SI for the last week and is at an acute low risk for suicide. Patient told to call clinic if any problems occur. Patient advised to go to ER if they should develop SI/HI, side effects, or if symptoms worsen. Pt has crisis numbers to call if needed. Pt acknowledged and agreed with plan and verbalized understanding.  F/up in 1 months or sooner if needed-I advised the patient that due to ongoing recommendations and guidelines changing quickly that the appointment may change and there is a possibility that it could be remote.  Mom and patient verbalized understanding.  The duration of this appointment visit was 20 minutes of face-to-face time with the patient.  Greater than 50% of this time was spent in counseling, explanation of  diagnosis, planning of further management, and coordination of care    Oletta Darter, MD 07/16/2018, 2:58 PM

## 2018-07-17 ENCOUNTER — Encounter (HOSPITAL_BASED_OUTPATIENT_CLINIC_OR_DEPARTMENT_OTHER)
Admission: RE | Admit: 2018-07-17 | Discharge: 2018-07-17 | Disposition: A | Discharge status: Home / Self Care | Payer: No Typology Code available for payment source | Source: Ambulatory Visit | Attending: Psychiatry | Admitting: Psychiatry

## 2018-07-17 ENCOUNTER — Ambulatory Visit: Payer: Self-pay | Admitting: Anesthesiology

## 2018-07-17 ENCOUNTER — Other Ambulatory Visit: Payer: Self-pay

## 2018-07-17 ENCOUNTER — Other Ambulatory Visit: Payer: Self-pay | Admitting: Psychiatry

## 2018-07-17 ENCOUNTER — Encounter: Payer: Self-pay | Admitting: Anesthesiology

## 2018-07-17 DIAGNOSIS — F332 Major depressive disorder, recurrent severe without psychotic features: Secondary | ICD-10-CM | POA: Diagnosis not present

## 2018-07-17 DIAGNOSIS — F329 Major depressive disorder, single episode, unspecified: Secondary | ICD-10-CM | POA: Diagnosis not present

## 2018-07-17 MED ORDER — GLYCOPYRROLATE 0.2 MG/ML IJ SOLN
INTRAMUSCULAR | Status: AC
  Administered 2018-07-17: 0.1 mg via INTRAVENOUS
  Filled 2018-07-17: qty 1

## 2018-07-17 MED ORDER — SUCCINYLCHOLINE CHLORIDE 20 MG/ML IJ SOLN
INTRAMUSCULAR | Status: AC
  Filled 2018-07-17: qty 1

## 2018-07-17 MED ORDER — SUCCINYLCHOLINE CHLORIDE 20 MG/ML IJ SOLN
INTRAMUSCULAR | Status: DC | PRN
  Administered 2018-07-17: 100 mg via INTRAVENOUS

## 2018-07-17 MED ORDER — GLYCOPYRROLATE 0.2 MG/ML IJ SOLN
0.1000 mg | Freq: Once | INTRAMUSCULAR | Status: AC
  Administered 2018-07-17: 0.1 mg via INTRAVENOUS

## 2018-07-17 MED ORDER — SODIUM CHLORIDE 0.9 % IV SOLN
INTRAVENOUS | Status: DC | PRN
  Administered 2018-07-17: 10:00:00 via INTRAVENOUS

## 2018-07-17 MED ORDER — KETOROLAC TROMETHAMINE 30 MG/ML IJ SOLN
INTRAMUSCULAR | Status: AC
  Administered 2018-07-17: 30 mg via INTRAVENOUS
  Filled 2018-07-17: qty 1

## 2018-07-17 MED ORDER — SODIUM CHLORIDE 0.9 % IV SOLN
500.0000 mL | Freq: Once | INTRAVENOUS | Status: AC
  Administered 2018-07-17: 500 mL via INTRAVENOUS

## 2018-07-17 MED ORDER — METHOHEXITAL SODIUM 100 MG/10ML IV SOSY
PREFILLED_SYRINGE | INTRAVENOUS | Status: DC | PRN
  Administered 2018-07-17: 70 mg via INTRAVENOUS

## 2018-07-17 MED ORDER — MIDAZOLAM HCL 2 MG/2ML IJ SOLN
INTRAMUSCULAR | Status: AC
  Filled 2018-07-17: qty 4

## 2018-07-17 MED ORDER — KETOROLAC TROMETHAMINE 30 MG/ML IJ SOLN
30.0000 mg | Freq: Once | INTRAMUSCULAR | Status: AC
  Administered 2018-07-17: 30 mg via INTRAVENOUS

## 2018-07-17 MED ORDER — MIDAZOLAM HCL 2 MG/2ML IJ SOLN
3.0000 mg | Freq: Once | INTRAMUSCULAR | Status: AC
  Administered 2018-07-17: 3 mg via INTRAVENOUS

## 2018-07-17 NOTE — Anesthesia Post-op Follow-up Note (Signed)
Anesthesia QCDR form completed.        

## 2018-07-17 NOTE — Anesthesia Preprocedure Evaluation (Addendum)
Anesthesia Evaluation  Patient identified by MRN, date of birth, ID band Patient awake    Reviewed: Allergy & Precautions, NPO status , Patient's Chart, lab work & pertinent test results, reviewed documented beta blocker date and time   Airway Mallampati: III  TM Distance: >3 FB     Dental  (+) Teeth Intact   Pulmonary           Cardiovascular      Neuro/Psych Seizures -,  PSYCHIATRIC DISORDERS Anxiety Depression Bipolar Disorder Autism.  Tremor.    GI/Hepatic GERD  Controlled,  Endo/Other    Renal/GU      Musculoskeletal   Abdominal   Peds  Hematology   Anesthesia Other Findings   Reproductive/Obstetrics                            Anesthesia Physical  Anesthesia Plan  ASA: II  Anesthesia Plan: General   Post-op Pain Management:    Induction: Intravenous  PONV Risk Score and Plan: Ondansetron  Airway Management Planned: Mask  Additional Equipment:   Intra-op Plan:   Post-operative Plan:   Informed Consent: I have reviewed the patients History and Physical, chart, labs and discussed the procedure including the risks, benefits and alternatives for the proposed anesthesia with the patient or authorized representative who has indicated his/her understanding and acceptance.       Plan Discussed with: CRNA and Anesthesiologist  Anesthesia Plan Comments:         Anesthesia Quick Evaluation

## 2018-07-17 NOTE — H&P (Signed)
Jill Alexander is an 23 y.o. female.   Chief Complaint: Patient with no specific complaints HPI: History of recurrent severe depression has done well with ECT  Past Medical History:  Diagnosis Date  . Anxiety   . Autism   . Depression   . GERD (gastroesophageal reflux disease)   . Heart murmur   . Seizures (HCC)   . 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 NO PAST SURGERIES    . UPPER GI ENDOSCOPY      Family History  Problem Relation Age of Onset  . Depression Father   . Anxiety disorder Sister   . Bipolar disorder Sister   . Diabetes Maternal Grandfather   . Cancer Maternal Grandfather   . Breast cancer Paternal Grandmother   . Uterine cancer Paternal Grandmother   . Irritable bowel syndrome Paternal Grandmother   . Colon cancer Neg Hx   . Esophageal cancer Neg Hx   . Stomach cancer Neg Hx   . Rectal cancer Neg Hx    Social History:  reports that she has never smoked. She has never used smokeless tobacco. She reports that she does not drink alcohol or use drugs.  Allergies:  Allergies  Allergen Reactions  . Gluten Meal     (Not in a hospital admission)   No results found for this or any previous visit (from the past 48 hour(s)). No results found.  Review of Systems  Constitutional: Negative.   HENT: Negative.   Eyes: Negative.   Respiratory: Negative.   Cardiovascular: Negative.   Gastrointestinal: Negative.   Musculoskeletal: Negative.   Skin: Negative.   Neurological: Negative.   Psychiatric/Behavioral: Negative.     Blood pressure 118/73, pulse 60, temperature 98.7 F (37.1 C), temperature source Oral, resp. rate 16, SpO2 98 %. Physical Exam  Nursing note and vitals reviewed. Constitutional: She appears well-developed and well-nourished.  HENT:  Head: Normocephalic and atraumatic.  Eyes: Pupils are equal, round, and reactive to light. Conjunctivae are normal.  Neck: Normal range of motion.   Cardiovascular: Regular rhythm and normal heart sounds.  Respiratory: Effort normal.  GI: Soft.  Musculoskeletal: Normal range of motion.  Neurological: She is alert.  Skin: Skin is warm and dry.  Psychiatric: She has a normal mood and affect. Her behavior is normal. Judgment and thought content normal.     Assessment/Plan Today will be the last treatment of this index course and we will tentatively schedule to see her again in 2 weeks  Mordecai Rasmussen, MD 07/17/2018, 10:32 AM

## 2018-07-17 NOTE — Anesthesia Postprocedure Evaluation (Signed)
Anesthesia Post Note  Patient: Jenella Wysocki  Procedure(s) Performed: ECT TX  Patient location during evaluation: PACU Anesthesia Type: General Level of consciousness: awake and alert Pain management: pain level controlled Vital Signs Assessment: post-procedure vital signs reviewed and stable Respiratory status: spontaneous breathing, nonlabored ventilation and respiratory function stable Cardiovascular status: blood pressure returned to baseline and stable Postop Assessment: no apparent nausea or vomiting Anesthetic complications: no     Last Vitals:  Vitals:   07/17/18 1052 07/17/18 1100  BP: (!) 118/57 (!) 108/55  Pulse: 73 61  Resp: 20 20  Temp: 36.8 C   SpO2: 97% 100%    Last Pain:  Vitals:   07/17/18 1100  TempSrc:   PainSc: Asleep                 Jovita Gamma

## 2018-07-17 NOTE — Transfer of Care (Signed)
Immediate Anesthesia Transfer of Care Note  Patient: Jill Alexander  Procedure(s) Performed: ECT TX  Patient Location: PACU  Anesthesia Type:General  Level of Consciousness: sedated  Airway & Oxygen Therapy: Patient Spontanous Breathing and Patient connected to face mask oxygen  Post-op Assessment: Report given to RN and Post -op Vital signs reviewed and stable  Post vital signs: Reviewed and stable  Last Vitals:  Vitals Value Taken Time  BP 118/57 07/17/2018 10:52 AM  Temp 36.8 C 07/17/2018 10:52 AM  Pulse 72 07/17/2018 10:52 AM  Resp 21 07/17/2018 10:52 AM  SpO2 99 % 07/17/2018 10:52 AM  Vitals shown include unvalidated device data.  Last Pain:  Vitals:   07/17/18 0847  TempSrc: Oral  PainSc: 0-No pain         Complications: No apparent anesthesia complications

## 2018-07-17 NOTE — Discharge Instructions (Addendum)
1)  The drugs that you have been given will stay in your system until tomorrow so for the       next 24 hours you should not:  A. Drive an automobile  B. Make any legal decisions  C. Drink any alcoholic beverages  2)  You may resume your regular meals upon return home.  3)  A responsible adult must take you home.  Someone should stay with you for a few          hours, then be available by phone for the remainder of the treatment day.  4)  You May experience any of the following symptoms:  Headache, Nausea and a dry mouth (due to the medications you were given),  temporary memory loss and some confusion, or sore muscles (a warm bath  should help this).  If you you experience any of these symptoms let us know on                your return visit.  5)  Report any of the following: any acute discomfort, severe headache, or temperature        greater than 100.5 F.   Also report any unusual redness, swelling, drainage, or pain         at your IV site.    You may report Symptoms to:  ECT PROGRAM- [place] at [facility]          Phone: [phone], ECT Department           or Dr. Shary Key office [phone]  6)  Your next ECT Treatment is Friday April 3  We will call 2 days prior to your scheduled appointment for arrival times.  7)  Nothing to eat or drink after midnight the night before your procedure.  8)  Take      With a sip of water the morning of your procedure.  9)  Other Instructions: Call [phone] to cancel the morning of your procedure due         to illness or emergency.  10) We will call within 72 hours to assess how you are feeling.

## 2018-07-17 NOTE — Procedures (Signed)
ECT SERVICES Physician's Interval Evaluation & Treatment Note  Patient Identification: Jill Alexander up around her face MRN:  [id number] Date of Evaluation:  07/17/2018 TX #: 6  MADRS:   MMSE:   P.E. Findings:  No change to physical exam  Psychiatric Interval Note:  Mood is actually good brighter  Subjective:  Patient is a 23 y.o. female seen for evaluation for Electroconvulsive Therapy. Recognizes that she has had positive change  Treatment Summary:   [x]   Right Unilateral             []  Bilateral   % Energy : 0.3 ms 25%   Impedance: 2000 ohms  Seizure Energy Index: 3681 V squared  Postictal Suppression Index: 24%  Seizure Concordance Index: 95%  Medications  Pre Shock: Robinul 0.1 mg Toradol 30 mg Brevital 70 mg succinylcholine 100 mg  Post Shock: Versed 3 mg  Seizure Duration: EMG about 50 seconds EEG 868 seconds   Comments: Patient seems to have shown some real improvement with this treatment and we are going to stop the index course now at #6 and see her back in 2 weeks tentatively.  Lungs:  [x]   Clear to auscultation               []  Other:   Heart:    [x]   Regular rhythm             []  irregular rhythm    [x]   Previous H&P reviewed, patient examined and there are NO CHANGES                 []   Previous H&P reviewed, patient examined and there are changes noted.   Mordecai Rasmussen, MD 3/20/202010:34 AM

## 2018-07-20 MED FILL — EMSAM 6 MG/24 HOURS PATCH: 6 | 30 days supply | Qty: 30 | Fill #0

## 2018-07-21 ENCOUNTER — Ambulatory Visit (INDEPENDENT_AMBULATORY_CARE_PROVIDER_SITE_OTHER): Payer: Medicaid Other | Admitting: Clinical

## 2018-07-21 DIAGNOSIS — F840 Autistic disorder: Secondary | ICD-10-CM

## 2018-07-22 ENCOUNTER — Encounter (HOSPITAL_COMMUNITY): Payer: No Typology Code available for payment source

## 2018-07-22 ENCOUNTER — Other Ambulatory Visit (HOSPITAL_COMMUNITY): Payer: Self-pay | Admitting: Psychiatry

## 2018-07-22 DIAGNOSIS — F410 Panic disorder [episodic paroxysmal anxiety] without agoraphobia: Secondary | ICD-10-CM

## 2018-07-27 MED FILL — VRAYLAR 4.5 MG CAPSULE: 4.5 | 30 days supply | Qty: 30 | Fill #0

## 2018-07-29 ENCOUNTER — Telehealth: Payer: Self-pay

## 2018-07-30 ENCOUNTER — Other Ambulatory Visit: Payer: Self-pay | Admitting: Psychiatry

## 2018-07-31 ENCOUNTER — Other Ambulatory Visit: Payer: Self-pay

## 2018-07-31 ENCOUNTER — Encounter
Admission: RE | Admit: 2018-07-31 | Discharge: 2018-07-31 | Disposition: A | Discharge status: Home / Self Care | Payer: No Typology Code available for payment source | Source: Ambulatory Visit | Attending: Psychiatry | Admitting: Psychiatry

## 2018-07-31 ENCOUNTER — Ambulatory Visit: Payer: Self-pay | Admitting: Anesthesiology

## 2018-07-31 ENCOUNTER — Encounter: Payer: Self-pay | Admitting: Anesthesiology

## 2018-07-31 DIAGNOSIS — Z8059 Family history of malignant neoplasm of other urinary tract organ: Secondary | ICD-10-CM | POA: Diagnosis not present

## 2018-07-31 DIAGNOSIS — F329 Major depressive disorder, single episode, unspecified: Secondary | ICD-10-CM | POA: Insufficient documentation

## 2018-07-31 DIAGNOSIS — Z803 Family history of malignant neoplasm of breast: Secondary | ICD-10-CM | POA: Diagnosis not present

## 2018-07-31 DIAGNOSIS — F840 Autistic disorder: Secondary | ICD-10-CM | POA: Insufficient documentation

## 2018-07-31 DIAGNOSIS — F332 Major depressive disorder, recurrent severe without psychotic features: Secondary | ICD-10-CM

## 2018-07-31 DIAGNOSIS — Z818 Family history of other mental and behavioral disorders: Secondary | ICD-10-CM | POA: Diagnosis not present

## 2018-07-31 DIAGNOSIS — Z809 Family history of malignant neoplasm, unspecified: Secondary | ICD-10-CM | POA: Diagnosis not present

## 2018-07-31 DIAGNOSIS — F419 Anxiety disorder, unspecified: Secondary | ICD-10-CM | POA: Insufficient documentation

## 2018-07-31 DIAGNOSIS — Z833 Family history of diabetes mellitus: Secondary | ICD-10-CM | POA: Insufficient documentation

## 2018-07-31 DIAGNOSIS — R569 Unspecified convulsions: Secondary | ICD-10-CM | POA: Diagnosis not present

## 2018-07-31 DIAGNOSIS — Z91018 Allergy to other foods: Secondary | ICD-10-CM | POA: Insufficient documentation

## 2018-07-31 MED ORDER — METHOHEXITAL SODIUM 100 MG/10ML IV SOSY
PREFILLED_SYRINGE | INTRAVENOUS | Status: DC | PRN
  Administered 2018-07-31: 70 mg via INTRAVENOUS

## 2018-07-31 MED ORDER — KETOROLAC TROMETHAMINE 30 MG/ML IJ SOLN
INTRAMUSCULAR | Status: AC
  Administered 2018-07-31: 30 mg via INTRAVENOUS
  Filled 2018-07-31: qty 1

## 2018-07-31 MED ORDER — METHOHEXITAL SODIUM 0.5 G IJ SOLR
INTRAMUSCULAR | Status: AC
  Filled 2018-07-31: qty 500

## 2018-07-31 MED ORDER — MIDAZOLAM HCL 2 MG/2ML IJ SOLN
INTRAMUSCULAR | Status: AC
  Filled 2018-07-31: qty 2

## 2018-07-31 MED ORDER — MIDAZOLAM HCL 2 MG/2ML IJ SOLN: 3.0000 mg | Freq: Once | INTRAMUSCULAR | Status: DC

## 2018-07-31 MED ORDER — GLYCOPYRROLATE 0.2 MG/ML IJ SOLN
0.1000 mg | Freq: Once | INTRAMUSCULAR | Status: AC
  Administered 2018-07-31: 0.1 mg via INTRAVENOUS

## 2018-07-31 MED ORDER — GLYCOPYRROLATE 0.2 MG/ML IJ SOLN
INTRAMUSCULAR | Status: AC
  Administered 2018-07-31: 0.1 mg via INTRAVENOUS
  Filled 2018-07-31: qty 1

## 2018-07-31 MED ORDER — KETOROLAC TROMETHAMINE 30 MG/ML IJ SOLN
30.0000 mg | Freq: Once | INTRAMUSCULAR | Status: AC
  Administered 2018-07-31: 30 mg via INTRAVENOUS

## 2018-07-31 MED ORDER — MIDAZOLAM HCL 2 MG/2ML IJ SOLN
INTRAMUSCULAR | Status: DC | PRN
  Administered 2018-07-31: 3 mg via INTRAVENOUS

## 2018-07-31 MED ORDER — SODIUM CHLORIDE 0.9 % IV SOLN
500.0000 mL | Freq: Once | INTRAVENOUS | Status: AC
  Administered 2018-07-31: 500 mL via INTRAVENOUS

## 2018-07-31 MED ORDER — SUCCINYLCHOLINE CHLORIDE 20 MG/ML IJ SOLN
INTRAMUSCULAR | Status: DC | PRN
  Administered 2018-07-31: 100 mg via INTRAVENOUS

## 2018-07-31 NOTE — Discharge Instructions (Signed)
1)  The drugs that you have been given will stay in your system until tomorrow so for the       next 24 hours you should not:  A. Drive an automobile  B. Make any legal decisions  C. Drink any alcoholic beverages  2)  You may resume your regular meals upon return home.  3)  A responsible adult must take you home.  Someone should stay with you for a few          hours, then be available by phone for the remainder of the treatment day.  4)  You May experience any of the following symptoms:  Headache, Nausea and a dry mouth (due to the medications you were given),  temporary memory loss and some confusion, or sore muscles (a warm bath  should help this).  If you you experience any of these symptoms let us know on                your return visit.  5)  Report any of the following: any acute discomfort, severe headache, or temperature        greater than 100.5 F.   Also report any unusual redness, swelling, drainage, or pain         at your IV site.    You may report Symptoms to:  ECT PROGRAM- [place] [facility]          Phone: [phone], ECT Department           or Dr. Shary Key office [phone]  6)  Your next ECT Treatment is Monday April 6  We will call 2 days prior to your scheduled appointment for arrival times.  7)  Nothing to eat or drink after midnight the night before your procedure.  8)  Take      With a sip of water the morning of your procedure.  9)  Other Instructions: Call [phone] to cancel the morning of your procedure due         to illness or emergency.  10) We will call within 72 hours to assess how you are feeling.

## 2018-07-31 NOTE — H&P (Signed)
Jill Alexander is an 23 y.o. female.   Chief Complaint: Mood has been worse.  More depressed down and anxious. HPI: History of recurrent severe depression  Past Medical History:  Diagnosis Date  . Anxiety   . Autism   . Depression   . GERD (gastroesophageal reflux disease)   . Heart murmur   . Seizures (HCC)   . 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 NO PAST SURGERIES    . UPPER GI ENDOSCOPY      Family History  Problem Relation Age of Onset  . Depression Father   . Anxiety disorder Sister   . Bipolar disorder Sister   . Diabetes Maternal Grandfather   . Cancer Maternal Grandfather   . Breast cancer Paternal Grandmother   . Uterine cancer Paternal Grandmother   . Irritable bowel syndrome Paternal Grandmother   . Colon cancer Neg Hx   . Esophageal cancer Neg Hx   . Stomach cancer Neg Hx   . Rectal cancer Neg Hx    Social History:  reports that she has never smoked. She has never used smokeless tobacco. She reports that she does not drink alcohol or use drugs.  Allergies:  Allergies  Allergen Reactions  . Gluten Meal     (Not in a hospital admission)   No results found for this or any previous visit (from the past 48 hour(s)). No results found.  Review of Systems  Constitutional: Negative.   HENT: Negative.   Eyes: Negative.   Respiratory: Negative.   Cardiovascular: Negative.   Gastrointestinal: Negative.   Musculoskeletal: Negative.   Skin: Negative.   Neurological: Negative.   Psychiatric/Behavioral: Positive for depression. Negative for hallucinations, substance abuse and suicidal ideas. The patient is nervous/anxious.     Blood pressure 107/69, pulse 65, temperature 98.2 F (36.8 C), temperature source Oral, resp. rate 16, SpO2 98 %. Physical Exam  Nursing note and vitals reviewed. Constitutional: She appears well-developed and well-nourished.  HENT:  Head: Normocephalic and atraumatic.  Eyes:  Pupils are equal, round, and reactive to light. Conjunctivae are normal.  Neck: Normal range of motion.  Cardiovascular: Regular rhythm and normal heart sounds.  Respiratory: Effort normal.  GI: Soft.  Musculoskeletal: Normal range of motion.  Neurological: She is alert.  Skin: Skin is warm and dry.  Psychiatric: She has a normal mood and affect. Her behavior is normal. Judgment and thought content normal.     Assessment/Plan Patient will get ECT again.  Her index course seems to have lost effectiveness after a little over a week.  After talking with the patient and her mother we will see her again on Monday and then hope that we can give it a break for at least a week.  Mordecai Rasmussen, MD 07/31/2018, 10:26 AM

## 2018-07-31 NOTE — Procedures (Signed)
ECT SERVICES Physician's Interval Evaluation & Treatment Note (  Patient Identification: Jill Alexander MRN:  [id number] Date of Evaluation:  07/31/2018 TX #: 7  MADRS:   MMSE:   P.E. Findings:  No change to physical exam.  Psychiatric Interval Note:  Patient does appear sad and anxious more down than previously and complains of worsening depression  Subjective:  Patient is a 23 y.o. female seen for evaluation for Electroconvulsive Therapy. Feeling more depressed no suicidality.  Treatment Summary:   [x]   Right Unilateral             []  Bilateral   % Energy : 0.3 ms, 25%   Impedance: 1490 ohms  Seizure Energy Index: 6150 V squared  Postictal Suppression Index: 47%  Seizure Concordance Index: 93%  Medications  Pre Shock: Toradol 30 mg Robinul 0.1 mg Brevital 70 mg succinylcholine 100 mg  Post Shock: Versed 3 mg  Seizure Duration: 56 seconds by EMG, at 159 seconds by EEG   Comments: We will see her back on Monday and then hope that we can go a week until the next treatment.  Lungs:  [x]   Clear to auscultation               []  Other:   Heart:    [x]   Regular rhythm             []  irregular rhythm    [x]   Previous H&P reviewed, patient examined and there are NO CHANGES                 []   Previous H&P reviewed, patient examined and there are changes noted.   Mordecai Rasmussen, MD 4/3/202010:27 AM

## 2018-07-31 NOTE — Transfer of Care (Signed)
Immediate Anesthesia Transfer of Care Note  Patient: Jill Alexander  Procedure(s) Performed: ECT TX  Patient Location: PACU  Anesthesia Type:General  Level of Consciousness: sedated  Airway & Oxygen Therapy: Patient Spontanous Breathing and Patient connected to face mask oxygen  Post-op Assessment: Report given to RN and Post -op Vital signs reviewed and stable  Post vital signs: Reviewed and stable  Last Vitals:  Vitals Value Taken Time  BP 107/53 07/31/2018 10:48 AM  Temp    Pulse 82 07/31/2018 10:49 AM  Resp 18 07/31/2018 10:49 AM  SpO2 99 % 07/31/2018 10:49 AM  Vitals shown include unvalidated device data.  Last Pain:  Vitals:   07/31/18 1048  TempSrc:   PainSc: 0-No pain         Complications: No apparent anesthesia complications

## 2018-07-31 NOTE — Anesthesia Preprocedure Evaluation (Signed)
Anesthesia Evaluation  Patient identified by MRN, date of birth, ID band Patient awake    Reviewed: Allergy & Precautions, NPO status , Patient's Chart, lab work & pertinent test results, reviewed documented beta blocker date and time   Airway Mallampati: II  TM Distance: >3 FB     Dental  (+) Chipped   Pulmonary           Cardiovascular + Valvular Problems/Murmurs      Neuro/Psych Seizures -,  PSYCHIATRIC DISORDERS Anxiety Depression Bipolar Disorder    GI/Hepatic GERD  Controlled,  Endo/Other    Renal/GU      Musculoskeletal   Abdominal   Peds  Hematology   Anesthesia Other Findings Autism. EKG shows LAHB.  Reproductive/Obstetrics                             Anesthesia Physical Anesthesia Plan  ASA: III  Anesthesia Plan: General   Post-op Pain Management:    Induction: Intravenous  PONV Risk Score and Plan:   Airway Management Planned:   Additional Equipment:   Intra-op Plan:   Post-operative Plan:   Informed Consent: I have reviewed the patients History and Physical, chart, labs and discussed the procedure including the risks, benefits and alternatives for the proposed anesthesia with the patient or authorized representative who has indicated his/her understanding and acceptance.       Plan Discussed with: CRNA  Anesthesia Plan Comments:         Anesthesia Quick Evaluation

## 2018-07-31 NOTE — Anesthesia Post-op Follow-up Note (Signed)
Anesthesia QCDR form completed.        

## 2018-07-31 NOTE — Anesthesia Postprocedure Evaluation (Signed)
Anesthesia Post Note  Patient: Jill Alexander  Procedure(s) Performed: ECT TX  Patient location during evaluation: PACU Anesthesia Type: General Level of consciousness: awake and alert Pain management: pain level controlled Vital Signs Assessment: post-procedure vital signs reviewed and stable Respiratory status: spontaneous breathing, nonlabored ventilation, respiratory function stable and patient connected to nasal cannula oxygen Cardiovascular status: blood pressure returned to baseline and stable Postop Assessment: no apparent nausea or vomiting Anesthetic complications: no     Last Vitals:  Vitals:   07/31/18 1118 07/31/18 1137  BP: 104/67 103/63  Pulse: 78 75  Resp: 17 16  Temp: 36.9 C 36.9 C  SpO2: 100%     Last Pain:  Vitals:   07/31/18 1137  TempSrc: Oral  PainSc: 0-No pain                 Kairee Isa S

## 2018-08-03 ENCOUNTER — Other Ambulatory Visit: Payer: Self-pay | Admitting: Gastroenterology

## 2018-08-03 ENCOUNTER — Encounter: Payer: Self-pay | Admitting: Anesthesiology

## 2018-08-03 ENCOUNTER — Encounter
Admission: RE | Admit: 2018-08-03 | Discharge: 2018-08-03 | Disposition: A | Discharge status: Home / Self Care | Payer: No Typology Code available for payment source | Source: Ambulatory Visit | Attending: Psychiatry | Admitting: Psychiatry

## 2018-08-03 ENCOUNTER — Other Ambulatory Visit: Payer: Self-pay

## 2018-08-03 ENCOUNTER — Other Ambulatory Visit: Payer: Self-pay | Admitting: Psychiatry

## 2018-08-03 DIAGNOSIS — F840 Autistic disorder: Secondary | ICD-10-CM | POA: Insufficient documentation

## 2018-08-03 DIAGNOSIS — F419 Anxiety disorder, unspecified: Secondary | ICD-10-CM | POA: Diagnosis not present

## 2018-08-03 DIAGNOSIS — K219 Gastro-esophageal reflux disease without esophagitis: Secondary | ICD-10-CM | POA: Diagnosis not present

## 2018-08-03 DIAGNOSIS — F332 Major depressive disorder, recurrent severe without psychotic features: Secondary | ICD-10-CM

## 2018-08-03 DIAGNOSIS — F329 Major depressive disorder, single episode, unspecified: Secondary | ICD-10-CM | POA: Diagnosis present

## 2018-08-03 LAB — POCT PREGNANCY, URINE: Preg Test, Ur: NEGATIVE

## 2018-08-03 MED ORDER — KETOROLAC TROMETHAMINE 30 MG/ML IJ SOLN
INTRAMUSCULAR | Status: AC
  Filled 2018-08-03: qty 1

## 2018-08-03 MED ORDER — MIDAZOLAM HCL 2 MG/2ML IJ SOLN
INTRAMUSCULAR | Status: DC | PRN
  Administered 2018-08-03: 3 mg via INTRAVENOUS

## 2018-08-03 MED ORDER — SODIUM CHLORIDE 0.9 % IV SOLN
500.0000 mL | Freq: Once | INTRAVENOUS | Status: AC
  Administered 2018-08-03: 500 mL via INTRAVENOUS

## 2018-08-03 MED ORDER — SODIUM CHLORIDE 0.9 % IV SOLN
INTRAVENOUS | Status: DC | PRN
  Administered 2018-08-03: 11:00:00 via INTRAVENOUS

## 2018-08-03 MED ORDER — METHOHEXITAL SODIUM 100 MG/10ML IV SOSY
PREFILLED_SYRINGE | INTRAVENOUS | Status: DC | PRN
  Administered 2018-08-03: 70 mg via INTRAVENOUS

## 2018-08-03 MED ORDER — SUCCINYLCHOLINE CHLORIDE 20 MG/ML IJ SOLN
INTRAMUSCULAR | Status: DC | PRN
  Administered 2018-08-03: 100 mg via INTRAVENOUS

## 2018-08-03 MED ORDER — GLYCOPYRROLATE 0.2 MG/ML IJ SOLN
0.1000 mg | Freq: Once | INTRAMUSCULAR | Status: AC
  Administered 2018-08-03: 0.1 mg via INTRAVENOUS

## 2018-08-03 MED ORDER — MIDAZOLAM HCL 2 MG/2ML IJ SOLN: 3.0000 mg | Freq: Once | INTRAMUSCULAR | Status: DC

## 2018-08-03 MED ORDER — KETOROLAC TROMETHAMINE 30 MG/ML IJ SOLN
30.0000 mg | Freq: Once | INTRAMUSCULAR | Status: AC
  Administered 2018-08-03: 30 mg via INTRAVENOUS

## 2018-08-03 MED ORDER — GLYCOPYRROLATE 0.2 MG/ML IJ SOLN
INTRAMUSCULAR | Status: AC
  Filled 2018-08-03: qty 1

## 2018-08-03 NOTE — Procedures (Signed)
ECT SERVICES Physician's Interval Evaluation & Treatment Note  Patient Identification: Monda Belveal MRN:  [id number] Date of Evaluation:  08/03/2018 TX #: 8  MADRS:   MMSE:   P.E. Findings:  No change in physical exam  Psychiatric Interval Note:  Mood mildly depressed and anxious bored  Subjective:  Patient is a 23 y.o. female seen for evaluation for Electroconvulsive Therapy. Little bit better than last time  Treatment Summary:   [x]   Right Unilateral             []  Bilateral   % Energy : 0.3 mg 25%   Impedance: 1540 ohms  Seizure Energy Index: 6537 V acquired  Postictal Suppression Index: 76%  Seizure Concordance Index: 86%  Medications  Pre Shock: Toradol 30 mg Robinul 0.1 mg Brevital 70 mg succinylcholine 100 mg  Post Shock: Versed 3 mg  Seizure Duration: 44 seconds EMG 146 seconds EEG   Comments: Follow-up in 1 week  Lungs:  [x]   Clear to auscultation               []  Other:   Heart:    [x]   Regular rhythm             []  irregular rhythm    [x]   Previous H&P reviewed, patient examined and there are NO CHANGES                 []   Previous H&P reviewed, patient examined and there are changes noted.   Mordecai Rasmussen, MD 4/6/202011:08 AM

## 2018-08-03 NOTE — Anesthesia Procedure Notes (Signed)
Procedure Name: General with mask airway Date/Time: 08/03/2018 11:15 AM Performed by: Irving Burton, CRNA Pre-anesthesia Checklist: Patient identified, Emergency Drugs available, Suction available, Patient being monitored and Timeout performed Patient Re-evaluated:Patient Re-evaluated prior to induction Oxygen Delivery Method: Circle system utilized Preoxygenation: Pre-oxygenation with 100% oxygen Induction Type: IV induction Ventilation: Mask ventilation without difficulty Airway Equipment and Method: Bite block Dental Injury: Teeth and Oropharynx as per pre-operative assessment

## 2018-08-03 NOTE — Anesthesia Post-op Follow-up Note (Signed)
Anesthesia QCDR form completed.        

## 2018-08-03 NOTE — Anesthesia Preprocedure Evaluation (Signed)
Anesthesia Evaluation  Patient identified by MRN, date of birth, ID band Patient awake    Reviewed: Allergy & Precautions, NPO status , Patient's Chart, lab work & pertinent test results  History of Anesthesia Complications Negative for: history of anesthetic complications  Airway Mallampati: II  TM Distance: >3 FB Neck ROM: Full    Dental no notable dental hx.    Pulmonary neg pulmonary ROS, neg sleep apnea, neg COPD,    breath sounds clear to auscultation- rhonchi (-) wheezing      Cardiovascular Exercise Tolerance: Good (-) hypertension(-) CAD, (-) Past MI, (-) Cardiac Stents and (-) CABG  Rhythm:Regular Rate:Normal - Systolic murmurs and - Diastolic murmurs    Neuro/Psych Seizures -, Well Controlled,  PSYCHIATRIC DISORDERS Anxiety Depression Bipolar Disorder    GI/Hepatic Neg liver ROS, GERD  ,  Endo/Other  negative endocrine ROSneg diabetes  Renal/GU negative Renal ROS     Musculoskeletal negative musculoskeletal ROS (+)   Abdominal (+) - obese,   Peds  Hematology negative hematology ROS (+)   Anesthesia Other Findings Past Medical History: No date: Anxiety No date: Autism No date: Depression No date: GERD (gastroesophageal reflux disease) No date: Heart murmur No date: Seizures (HCC) 04/09/2017: Tremor, essential   Reproductive/Obstetrics                             Anesthesia Physical Anesthesia Plan  ASA: II  Anesthesia Plan: General   Post-op Pain Management:    Induction: Intravenous  PONV Risk Score and Plan: 2 and Ondansetron  Airway Management Planned: Mask  Additional Equipment:   Intra-op Plan:   Post-operative Plan:   Informed Consent: I have reviewed the patients History and Physical, chart, labs and discussed the procedure including the risks, benefits and alternatives for the proposed anesthesia with the patient or authorized representative who has  indicated his/her understanding and acceptance.     Dental advisory given  Plan Discussed with: CRNA and Anesthesiologist  Anesthesia Plan Comments:         Anesthesia Quick Evaluation  

## 2018-08-03 NOTE — Anesthesia Postprocedure Evaluation (Signed)
Anesthesia Post Note  Patient: Bareerah Amrhein  Procedure(s) Performed: ECT TX  Patient location during evaluation: PACU Anesthesia Type: General Level of consciousness: awake and alert and oriented Pain management: pain level controlled Vital Signs Assessment: post-procedure vital signs reviewed and stable Respiratory status: spontaneous breathing, nonlabored ventilation and respiratory function stable Cardiovascular status: blood pressure returned to baseline and stable Postop Assessment: no signs of nausea or vomiting Anesthetic complications: no     Last Vitals:  Vitals:   08/03/18 1217 08/03/18 1225  BP: 111/63 106/69  Pulse: 88 85  Resp: 19 16  Temp: 36.7 C 36.7 C  SpO2: 100%     Last Pain:  Vitals:   08/03/18 1225  TempSrc: Oral  PainSc: 0-No pain                 Bubber Rothert

## 2018-08-03 NOTE — Discharge Instructions (Signed)
1)  The drugs that you have been given will stay in your system until tomorrow so for the       next 24 hours you should not: ° A. Drive an automobile ° B. Make any legal decisions ° C. Drink any alcoholic beverages ° °2)  You may resume your regular meals upon return home. ° °3)  A responsible adult must take you home.  Someone should stay with you for a few          hours, then be available by phone for the remainder of the treatment day. ° °4)  You May experience any of the following symptoms: ° Headache, Nausea and a dry mouth (due to the medications you were given),  temporary memory loss and some confusion, or sore muscles (a warm bath  should help this).  If you you experience any of these symptoms let us know on                your return visit. ° °5)  Report any of the following: any acute discomfort, severe headache, or temperature        greater than 100.5 F.   Also report any unusual redness, swelling, drainage, or pain         at your IV site. ° °  You may report Symptoms to:  ECT PROGRAM- [facility] °         Phone: [phone], ECT Department  °         or Dr. Clapac's office [phone] ° °6)  Your next ECT Treatment is Wednesday April 15  ° We will call 2 days prior to your scheduled appointment for arrival times. ° °7)  Nothing to eat or drink after midnight the night before your procedure. ° °8)  Take      With a sip of water the morning of your procedure. ° °9)  Other Instructions: Call [phone] to cancel the morning of your procedure due         to illness or emergency. ° °10) We will call within 72 hours to assess how you are feeling.  °

## 2018-08-03 NOTE — H&P (Signed)
Jill Alexander is an 23 y.o. female.   Chief Complaint: mild depression with no SI HPI: History of severe chronic depression  Past Medical History:  Diagnosis Date  . Anxiety   . Autism   . Depression   . GERD (gastroesophageal reflux disease)   . Heart murmur   . Seizures (HCC)   . 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 NO PAST SURGERIES    . UPPER GI ENDOSCOPY      Family History  Problem Relation Age of Onset  . Depression Father   . Anxiety disorder Sister   . Bipolar disorder Sister   . Diabetes Maternal Grandfather   . Cancer Maternal Grandfather   . Breast cancer Paternal Grandmother   . Uterine cancer Paternal Grandmother   . Irritable bowel syndrome Paternal Grandmother   . Colon cancer Neg Hx   . Esophageal cancer Neg Hx   . Stomach cancer Neg Hx   . Rectal cancer Neg Hx    Social History:  reports that she has never smoked. She has never used smokeless tobacco. She reports that she does not drink alcohol or use drugs.  Allergies:  Allergies  Allergen Reactions  . Gluten Meal     (Not in a hospital admission)   No results found for this or any previous visit (from the past 48 hour(s)). No results found.  Review of Systems  Constitutional: Negative.   HENT: Negative.   Eyes: Negative.   Respiratory: Negative.   Cardiovascular: Negative.   Gastrointestinal: Negative.   Musculoskeletal: Negative.   Skin: Negative.   Neurological: Negative.   Psychiatric/Behavioral: Positive for depression. Negative for hallucinations, memory loss, substance abuse and suicidal ideas. The patient is not nervous/anxious and does not have insomnia.     Blood pressure 117/72, pulse 60, temperature 98.3 F (36.8 C), temperature source Oral, resp. rate 16, SpO2 98 %. Physical Exam  Nursing note and vitals reviewed. Constitutional: She appears well-developed and well-nourished.  HENT:  Head: Normocephalic and  atraumatic.  Eyes: Pupils are equal, round, and reactive to light. Conjunctivae are normal.  Neck: Normal range of motion.  Cardiovascular: Regular rhythm and normal heart sounds.  Respiratory: Effort normal.  GI: Soft.  Musculoskeletal: Normal range of motion.  Neurological: She is alert.  Skin: Skin is warm and dry.  Psychiatric: Her speech is normal and behavior is normal. Judgment and thought content normal. Her affect is blunt. Cognition and memory are normal.     Assessment/Plan Treatment today and then follow up in one week  Mordecai Rasmussen, MD 08/03/2018, 10:02 AM

## 2018-08-03 NOTE — Transfer of Care (Signed)
Immediate Anesthesia Transfer of Care Note  Patient: Jill Alexander  Procedure(s) Performed: ECT TX  Patient Location: PACU  Anesthesia Type:General  Level of Consciousness: sedated  Airway & Oxygen Therapy: Patient connected to face mask oxygen  Post-op Assessment: Post -op Vital signs reviewed and stable  Post vital signs: stable  Last Vitals:  Vitals Value Taken Time  BP 123/59 08/03/2018 11:27 AM  Temp 36.7 C 08/03/2018 11:27 AM  Pulse 73 08/03/2018 11:31 AM  Resp 18 08/03/2018 11:31 AM  SpO2 98 % 08/03/2018 11:31 AM  Vitals shown include unvalidated device data.  Last Pain:  Vitals:   08/03/18 1127  TempSrc:   PainSc: Asleep         Complications: No apparent anesthesia complications

## 2018-08-08 ENCOUNTER — Ambulatory Visit (INDEPENDENT_AMBULATORY_CARE_PROVIDER_SITE_OTHER): Payer: No Typology Code available for payment source | Admitting: Clinical

## 2018-08-08 DIAGNOSIS — F840 Autistic disorder: Secondary | ICD-10-CM

## 2018-08-10 ENCOUNTER — Ambulatory Visit: Payer: Medicaid Other | Admitting: Clinical

## 2018-08-10 ENCOUNTER — Telehealth: Payer: Self-pay

## 2018-08-11 ENCOUNTER — Other Ambulatory Visit: Payer: Self-pay | Admitting: Psychiatry

## 2018-08-12 ENCOUNTER — Encounter: Payer: Self-pay | Admitting: Anesthesiology

## 2018-08-12 ENCOUNTER — Other Ambulatory Visit: Payer: Self-pay

## 2018-08-12 ENCOUNTER — Encounter (HOSPITAL_BASED_OUTPATIENT_CLINIC_OR_DEPARTMENT_OTHER)
Admission: RE | Admit: 2018-08-12 | Discharge: 2018-08-12 | Disposition: A | Discharge status: Home / Self Care | Payer: No Typology Code available for payment source | Source: Ambulatory Visit | Attending: Psychiatry | Admitting: Psychiatry

## 2018-08-12 ENCOUNTER — Ambulatory Visit: Payer: Self-pay | Admitting: Anesthesiology

## 2018-08-12 DIAGNOSIS — F329 Major depressive disorder, single episode, unspecified: Secondary | ICD-10-CM | POA: Diagnosis not present

## 2018-08-12 DIAGNOSIS — F332 Major depressive disorder, recurrent severe without psychotic features: Secondary | ICD-10-CM | POA: Diagnosis not present

## 2018-08-12 LAB — POCT PREGNANCY, URINE: Preg Test, Ur: NEGATIVE

## 2018-08-12 MED ORDER — METHOHEXITAL SODIUM 100 MG/10ML IV SOSY
PREFILLED_SYRINGE | INTRAVENOUS | Status: DC | PRN
  Administered 2018-08-12: 70 mg via INTRAVENOUS

## 2018-08-12 MED ORDER — ONDANSETRON HCL 4 MG/2ML IJ SOLN
4.0000 mg | INTRAMUSCULAR | Status: AC
  Administered 2018-08-12: 10:00:00 4 mg via INTRAMUSCULAR

## 2018-08-12 MED ORDER — METHOHEXITAL SODIUM 0.5 G IJ SOLR
INTRAMUSCULAR | Status: AC
  Filled 2018-08-12: qty 500

## 2018-08-12 MED ORDER — ONDANSETRON HCL 4 MG/2ML IJ SOLN
INTRAMUSCULAR | Status: AC
  Administered 2018-08-12: 4 mg via INTRAMUSCULAR
  Filled 2018-08-12: qty 2

## 2018-08-12 MED ORDER — SUCCINYLCHOLINE CHLORIDE 20 MG/ML IJ SOLN
INTRAMUSCULAR | Status: DC | PRN
  Administered 2018-08-12: 100 mg via INTRAVENOUS

## 2018-08-12 MED ORDER — SODIUM CHLORIDE 0.9 % IV SOLN
500.0000 mL | Freq: Once | INTRAVENOUS | Status: AC
  Administered 2018-08-12: 500 mL via INTRAVENOUS

## 2018-08-12 MED ORDER — MIDAZOLAM HCL 5 MG/5ML IJ SOLN
INTRAMUSCULAR | Status: DC | PRN
  Administered 2018-08-12: 3 mg via INTRAVENOUS

## 2018-08-12 MED ORDER — SUCCINYLCHOLINE CHLORIDE 20 MG/ML IJ SOLN
INTRAMUSCULAR | Status: AC
  Filled 2018-08-12: qty 1

## 2018-08-12 MED ORDER — MIDAZOLAM HCL 5 MG/5ML IJ SOLN
INTRAMUSCULAR | Status: AC
  Filled 2018-08-12: qty 5

## 2018-08-12 NOTE — Anesthesia Post-op Follow-up Note (Signed)
Anesthesia QCDR form completed.        

## 2018-08-12 NOTE — Anesthesia Preprocedure Evaluation (Signed)
Anesthesia Evaluation  Patient identified by MRN, date of birth, ID band Patient awake    Reviewed: Allergy & Precautions, NPO status , Patient's Chart, lab work & pertinent test results  History of Anesthesia Complications Negative for: history of anesthetic complications  Airway Mallampati: II  TM Distance: >3 FB Neck ROM: Full    Dental no notable dental hx. (+) Chipped   Pulmonary neg pulmonary ROS, neg sleep apnea, neg COPD,    breath sounds clear to auscultation- rhonchi (-) wheezing      Cardiovascular Exercise Tolerance: Good (-) hypertension(-) CAD, (-) Past MI, (-) Cardiac Stents and (-) CABG  Rhythm:Regular Rate:Normal - Systolic murmurs and - Diastolic murmurs    Neuro/Psych Seizures -, Well Controlled,  PSYCHIATRIC DISORDERS Anxiety Depression Bipolar Disorder    GI/Hepatic Neg liver ROS, GERD  ,  Endo/Other  negative endocrine ROSneg diabetes  Renal/GU negative Renal ROS     Musculoskeletal negative musculoskeletal ROS (+)   Abdominal (+) - obese,   Peds  Hematology negative hematology ROS (+)   Anesthesia Other Findings Past Medical History: No date: Anxiety No date: Autism No date: Depression No date: GERD (gastroesophageal reflux disease) No date: Heart murmur No date: Seizures (HCC) 04/09/2017: Tremor, essential   Reproductive/Obstetrics                             Anesthesia Physical  Anesthesia Plan  ASA: III  Anesthesia Plan: General   Post-op Pain Management:    Induction: Intravenous  PONV Risk Score and Plan: 2 and Ondansetron  Airway Management Planned: Mask  Additional Equipment:   Intra-op Plan:   Post-operative Plan:   Informed Consent: I have reviewed the patients History and Physical, chart, labs and discussed the procedure including the risks, benefits and alternatives for the proposed anesthesia with the patient or authorized  representative who has indicated his/her understanding and acceptance.     Dental advisory given  Plan Discussed with: CRNA and Anesthesiologist  Anesthesia Plan Comments: (Patient consented for risks of anesthesia including but not limited to:  - adverse reactions to medications - risk of intubation if required - damage to teeth, lips or other oral mucosa - sore throat or hoarseness - Damage to heart, brain, lungs or loss of life  Patient voiced understanding.)        Anesthesia Quick Evaluation

## 2018-08-12 NOTE — Procedures (Signed)
ECT SERVICES Physician's Interval Evaluation & Treatment Note  Patient Identification: Jill Alexander MRN:  [id number] Date of Evaluation:  08/12/2018 TX #: 9  MADRS:   MMSE:   P.E. Findings:  No change to physical  Psychiatric Interval Note:  Stable.  Certainly not euphoric but not suicidal.  Better than when she started I think  Subjective:  Patient is a 23 y.o. female seen for evaluation for Electroconvulsive Therapy. Stable some anxiety a lot of it obviously situational  Treatment Summary:   [x]   Right Unilateral             []  Bilateral   % Energy : 0.3 ms 25%   Impedance: 1810 ohms  Seizure Energy Index: 6026 V squared  Postictal Suppression Index: 87%  Seizure Concordance Index: 92%  Medications  Pre Shock: Toradol 30 mg Robinul 0.1 mg Brevital 70 mg succinylcholine 100 mg  Post Shock: Versed 3 mg  Seizure Duration: 29 seconds EMG 178 seconds EEG   Comments: Follow-up still to be determined after discussing plans with the patient and her mother.  I might suggest something like 2 weeks but we will see what they prefer.  Lungs:  [x]   Clear to auscultation               []  Other:   Heart:    [x]   Regular rhythm             []  irregular rhythm    [x]   Previous H&P reviewed, patient examined and there are NO CHANGES                 []   Previous H&P reviewed, patient examined and there are changes noted.   Mordecai Rasmussen, MD 4/15/202010:22 AM

## 2018-08-12 NOTE — Anesthesia Postprocedure Evaluation (Signed)
Anesthesia Post Note  Patient: Jill Alexander  Procedure(s) Performed: ECT TX  Patient location during evaluation: PACU Anesthesia Type: General Level of consciousness: awake and alert Pain management: pain level controlled Vital Signs Assessment: post-procedure vital signs reviewed and stable Respiratory status: spontaneous breathing, nonlabored ventilation, respiratory function stable and patient connected to nasal cannula oxygen Cardiovascular status: blood pressure returned to baseline and stable Postop Assessment: no apparent nausea or vomiting Anesthetic complications: no     Last Vitals:  Vitals:   08/12/18 1107 08/12/18 1119  BP: 113/60 111/67  Pulse: 75 73  Resp: 20 18  Temp:  36.6 C  SpO2: 100%     Last Pain:  Vitals:   08/12/18 1119  TempSrc: Oral  PainSc: 0-No pain                 Cleda Mccreedy Meaghann Choo

## 2018-08-12 NOTE — Transfer of Care (Signed)
Immediate Anesthesia Transfer of Care Note  Patient: Jill Alexander  Procedure(s) Performed: ECT TX  Patient Location: PACU  Anesthesia Type:General  Level of Consciousness: awake and sedated  Airway & Oxygen Therapy: Patient Spontanous Breathing and Patient connected to nasal cannula oxygen  Post-op Assessment: Report given to RN and Post -op Vital signs reviewed and stable  Post vital signs: Reviewed  Last Vitals:  Vitals Value Taken Time  BP 115/51 08/12/2018 10:27 AM  Temp 36.6 C 08/12/2018 10:27 AM  Pulse 73 08/12/2018 10:27 AM  Resp 15 08/12/2018 10:27 AM  SpO2 98 % 08/12/2018 10:27 AM    Last Pain:  Vitals:   08/12/18 0833  TempSrc:   PainSc: 0-No pain         Complications: No apparent anesthesia complications

## 2018-08-12 NOTE — Discharge Instructions (Signed)
1)  The drugs that you have been given will stay in your system until tomorrow so for the       next 24 hours you should not:  A. Drive an automobile  B. Make any legal decisions  C. Drink any alcoholic beverages  2)  You may resume your regular meals upon return home.  3)  A responsible adult must take you home.  Someone should stay with you for a few          hours, then be available by phone for the remainder of the treatment day.  4)  You May experience any of the following symptoms:  Headache, Nausea and a dry mouth (due to the medications you were given),  temporary memory loss and some confusion, or sore muscles (a warm bath  should help this).  If you you experience any of these symptoms let us know on                your return visit.  5)  Report any of the following: any acute discomfort, severe headache, or temperature        greater than 100.5 F.   Also report any unusual redness, swelling, drainage, or pain         at your IV site.    You may report Symptoms to:  ECT PROGRAM- [place] [facility]          Phone: [phone], ECT Department           or Dr. Shary Key office [phone]  6)  Your next ECT Treatment is Wednesday April 29   We will call 2 days prior to your scheduled appointment for arrival times.  7)  Nothing to eat or drink after midnight the night before your procedure.  8)  Take    With a sip of water the morning of your procedure.  9)  Other Instructions: Call [phone] to cancel the morning of your procedure due         to illness or emergency.  10) We will call within 72 hours to assess how you are feeling.

## 2018-08-12 NOTE — H&P (Signed)
Jill Alexander is an 23 y.o. female.   Chief Complaint: Patient has no specific complaint.  Chronic anxiety and depression.  No new physical problems. HPI: History of recurrent depression which seemed to show some improvement with ECT.  We are still on the fence about how much benefit she is getting out of maintenance.  Past Medical History:  Diagnosis Date  . Anxiety   . Autism   . Depression   . GERD (gastroesophageal reflux disease)   . Heart murmur   . Seizures (HCC)   . 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 NO PAST SURGERIES    . UPPER GI ENDOSCOPY      Family History  Problem Relation Age of Onset  . Depression Father   . Anxiety disorder Sister   . Bipolar disorder Sister   . Diabetes Maternal Grandfather   . Cancer Maternal Grandfather   . Breast cancer Paternal Grandmother   . Uterine cancer Paternal Grandmother   . Irritable bowel syndrome Paternal Grandmother   . Colon cancer Neg Hx   . Esophageal cancer Neg Hx   . Stomach cancer Neg Hx   . Rectal cancer Neg Hx    Social History:  reports that she has never smoked. She has never used smokeless tobacco. She reports that she does not drink alcohol or use drugs.  Allergies:  Allergies  Allergen Reactions  . Gluten Meal     (Not in a hospital admission)   No results found for this or any previous visit (from the past 48 hour(s)). No results found.  Review of Systems  Constitutional: Negative.   HENT: Negative.   Eyes: Negative.   Respiratory: Negative.   Cardiovascular: Negative.   Gastrointestinal: Negative.   Musculoskeletal: Negative.   Skin: Negative.   Neurological: Negative.   Psychiatric/Behavioral: Negative for depression, substance abuse and suicidal ideas. The patient is nervous/anxious.     Blood pressure 114/62, pulse 63, temperature 98.2 F (36.8 C), temperature source Oral, resp. rate 16, SpO2 98 %. Physical Exam  Nursing note  and vitals reviewed. Constitutional: She appears well-developed and well-nourished.  HENT:  Head: Normocephalic and atraumatic.  Eyes: Pupils are equal, round, and reactive to light. Conjunctivae are normal.  Neck: Normal range of motion.  Cardiovascular: Regular rhythm and normal heart sounds.  Respiratory: Effort normal.  GI: Soft.  Musculoskeletal: Normal range of motion.  Neurological: She is alert.  Skin: Skin is warm and dry.  Psychiatric: Judgment normal. Her affect is blunt. Her speech is delayed. She is slowed. Cognition and memory are normal. She expresses no homicidal and no suicidal ideation.     Assessment/Plan We discussed options and we will go ahead and pursue treatment today.  Certainly does not need hospitalization.  We will reassess along with the patient and her family whether further treatment will be helpful.  Mordecai Rasmussen, MD 08/12/2018, 10:09 AM

## 2018-08-14 MED FILL — EMSAM 6 MG/24 HOURS PATCH: 6 | 30 days supply | Qty: 30 | Fill #1

## 2018-08-14 MED FILL — LORazepam 2 MG TABS: 2 | 30 days supply | Qty: 60 | Fill #0

## 2018-08-15 ENCOUNTER — Ambulatory Visit (INDEPENDENT_AMBULATORY_CARE_PROVIDER_SITE_OTHER): Payer: No Typology Code available for payment source | Admitting: Clinical

## 2018-08-15 DIAGNOSIS — F840 Autistic disorder: Secondary | ICD-10-CM

## 2018-08-18 ENCOUNTER — Ambulatory Visit: Payer: Medicaid Other | Admitting: Clinical

## 2018-08-20 ENCOUNTER — Encounter (HOSPITAL_COMMUNITY): Payer: Self-pay | Admitting: Psychiatry

## 2018-08-20 ENCOUNTER — Ambulatory Visit (HOSPITAL_COMMUNITY): Payer: No Typology Code available for payment source | Admitting: Psychiatry

## 2018-08-20 ENCOUNTER — Other Ambulatory Visit: Payer: Self-pay

## 2018-08-20 DIAGNOSIS — F410 Panic disorder [episodic paroxysmal anxiety] without agoraphobia: Secondary | ICD-10-CM

## 2018-08-20 DIAGNOSIS — F3181 Bipolar II disorder: Secondary | ICD-10-CM

## 2018-08-20 DIAGNOSIS — F0630 Mood disorder due to known physiological condition, unspecified: Secondary | ICD-10-CM

## 2018-08-20 MED ORDER — CARIPRAZINE HCL 4.5 MG PO CAPS: 4.5000 mg | ORAL_CAPSULE | Freq: Every day | ORAL | 1 refills | Status: DC

## 2018-08-20 MED ORDER — LORAZEPAM 2 MG PO TABS: 2.0000 mg | ORAL_TABLET | Freq: Two times a day (BID) | ORAL | 1 refills | Status: DC

## 2018-08-20 MED ORDER — LAMOTRIGINE 150 MG PO TABS: 150.0000 mg | ORAL_TABLET | Freq: Two times a day (BID) | ORAL | 1 refills | Status: DC

## 2018-08-20 NOTE — Progress Notes (Unsigned)
BH MD/PA/NP OP Progress Note Virtual Visit via Telephone Note  I connected with pt on 08/20/18 at  11:15am EDT by telephone and verified that I am speaking with the correct person using two identifiers.    I discussed the limitations, risks, security and privacy concerns of performing an evaluation and management service by telephone and the availability of in person appointments. I also discussed with the patient that there may be a patient responsible charge related to this service. The patient expressed understanding and agreed to proceed.  08/20/2018 11:49 AM [name] Ronnald Ramp  MRN:  [id number]  Chief Complaint:  Chief Complaint    Depression     HPI:    ***Jill Alexander is here with her mother.  Mother states that Jill Alexander has completed 5 ECT treatments and will have another one tomorrow.  She has been tolerating the procedures well.  She complains of headache and there is some apparent short-term memory loss which they expected.  Mom feels as though Jill Alexander is showing signs of improvement.  She seems less anxious and is not sending as many emails or text messages to her mother throughout the day.  She also seems a little bit less depressed as she has not spoken of a lot of negative things the way she was before.  Jill Alexander tells me that she does feel a little better, less depressed on, but was unable to give any specifics.  She is fatigued and is sleeping well.  She is concerned about the recurrent pandemic with the coronavirus.  Jill Alexander is denying SI/HI.  Visit Diagnosis:    ICD-10-CM   1. Mood disorder in conditions classified elsewhere F06.30 lamoTRIgine (LAMICTAL) 150 MG tablet    Cariprazine HCl 4.5 MG CAPS  2. Panic attacks F41.0 LORazepam (ATIVAN) 2 MG tablet  3. Bipolar 2 disorder (HCC) F31.81 lamoTRIgine (LAMICTAL) 150 MG tablet    Cariprazine HCl 4.5 MG CAPS      Past Psychiatric History: Patient denies any history of prior psychiatric hospitalizations.  She was treated with  medications in the past-Lamictal, Abilify- flat and lethargic, Wellbutrin, Prozac, Ativan, Lithium.  Prior to coming to [facility] outpatient patient was receiving treatment at [facility] In [place] pt worked with one psychiatrist for over 10 yrs. Pt denies any hx of previous suicide attempts.   Past Medical History:  Past Medical History:  Diagnosis Date  . Anxiety   . Autism   . Depression   . GERD (gastroesophageal reflux disease)   . Heart murmur   . Seizures (HCC)   . 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 NO PAST SURGERIES    . UPPER GI ENDOSCOPY      Family Psychiatric  History:  Family History  Problem Relation Age of Onset  . Depression Father   . Anxiety disorder Sister   . Bipolar disorder Sister   . Diabetes Maternal Grandfather   . Cancer Maternal Grandfather   . Breast cancer Paternal Grandmother   . Uterine cancer Paternal Grandmother   . Irritable bowel syndrome Paternal Grandmother   . Colon cancer Neg Hx   . Esophageal cancer Neg Hx   . Stomach cancer Neg Hx   . Rectal cancer Neg Hx     Social History:  Social History   Socioeconomic History  . Marital status: Single    Spouse name: Not on file  . Number of children: 0  . Years of education: Not on file  . Highest  education level: Some college, no degree  Occupational History  . Not on file  Social Needs  . Financial resource strain: Not hard at all  . Food insecurity:    Worry: Never true    Inability: Never true  . Transportation needs:    Medical: Yes    Non-medical: Yes  Tobacco Use  . Smoking status: Never Smoker  . Smokeless tobacco: Never Used  Substance and Sexual Activity  . Alcohol use: No  . Drug use: No  . Sexual activity: Not Currently  Lifestyle  . Physical activity:    Days per week: 3 days    Minutes per session: 30 min  . Stress: Rather much  Relationships  . Social connections:    Talks on phone: Never    Gets together:  More than three times a week    Attends religious service: 1 to 4 times per year    Active member of club or organization: No    Attends meetings of clubs or organizations: Never    Relationship status: Never married  Other Topics Concern  . Not on file  Social History Narrative   Lives with parents in [place]. Pt moved here from [place] in 2018.   Caffeine use: 2-3 cups per day   Left handed     Allergies:  Allergies  Allergen Reactions  . Gluten Meal     Metabolic Disorder Labs: No results found for: HGBA1C, MPG No results found for: PROLACTIN No results found for: CHOL, TRIG, HDL, CHOLHDL, VLDL, LDLCALC Lab Results  Component Value Date   TSH 1.340 11/17/2017   TSH 0.677 08/21/2017    Therapeutic Level Labs: Lab Results  Component Value Date   LITHIUM 0.8 01/05/2018   LITHIUM 0.5 (L) 12/18/2017   No results found for: VALPROATE No components found for:  CBMZ  Current Medications: Current Outpatient Medications  Medication Sig Dispense Refill  . acetaminophen (TYLENOL) 500 MG tablet Take 500 mg by mouth every 6 (six) hours as needed.    . Cariprazine HCl 4.5 MG CAPS Take 1 capsule (4.5 mg total) by mouth daily. 30 capsule 1  . Ibuprofen (MOTRIN PO) Take 200 mg by mouth as needed.     . lamoTRIgine (LAMICTAL) 150 MG tablet Take 1 tablet (150 mg total) by mouth 2 (two) times daily. 60 tablet 1  . LORazepam (ATIVAN) 2 MG tablet Take 1 tablet (2 mg total) by mouth 2 (two) times daily. 60 tablet 1  . Multiple Vitamins-Minerals (MULTIVITAMIN PO) Take 1 tablet by mouth daily.    . Norethin Ace-Eth Estrad-FE (TAYTULLA) 1-20 MG-MCG(24) CAPS Take 1 tablet by mouth daily. 28 capsule 6  . omeprazole (PRILOSEC) 40 MG capsule TAKE 1 CAPSULE BY MOUTH TWICE A DAY 60 capsule 1  . ondansetron (ZOFRAN ODT) 8 MG disintegrating tablet Take 1 tablet (8 mg total) by mouth 2 (two) times daily. 60 tablet 3  . selegiline (EMSAM) 6 MG/24HR Place 1 patch (6 mg total) onto the skin daily.  30 patch 12  . polyethylene glycol (MIRALAX) packet Take 17 g by mouth daily as needed. (Patient not taking: Reported on 08/20/2018) 14 each 0   No current facility-administered medications for this visit.      Psychiatric Specialty Exam: ROS  There were no vitals taken for this visit.There is no height or weight on file to calculate BMI.  Pt is calm, cooperative and engaged on the phone today  General Appearance: unable to comment  Eye Contact:  Unable to comment  Speech:  Clear and Coherent and Slow  Volume:  Normal  Mood:  Depressed  Affect:  Flat  Thought Process:  Coherent, Linear and Descriptions of Associations: Intact  Orientation:  Full (Time, Place, and Person)  Thought Content:  Logical  Suicidal Thoughts:  Yes.  without intent/plan  Homicidal Thoughts:  No  Memory:  Immediate;   Good  Judgement:  Fair  Insight:  Fair  Psychomotor Activity:  Unable to comment  Concentration:  Concentration: Good  Recall:  Good  Fund of Knowledge:  Good  Language:  Good  Akathisia:  Unable to comment  Handed:  Right  AIMS (if indicated):     Assets:  Communication Skills Desire for Improvement Financial Resources/Insurance Housing Social Support Transportation Vocational/Educational  ADL's:  Unable to comment  Cognition:  WNL  Sleep:           Screenings: ECT-MADRS     ECT [facility]  MADRS Total Score  24  20  37    GAD-7     Counselor from 06/01/2018 in [facility]HAVIORAL [facility] PARTIAL HOSPITALIZATION PROGRAM Counselor from 05/22/2018 in BEHAVIORAL HEALTH PARTIAL HOSPITALIZATION PROGRAM [facility]  Total GAD-7 Score  18  19  15  14  18     Mini-Mental     ECT [facility]  Total Score (max 30 points )  30  30  30     PHQ2-9     Counselor from 06/01/2018 in BEHAVIORAL HEALTH PARTIAL HOSPITALIZATION PROGRAM Counselor from 05/22/2018 in BEHAVIORAL HEALTH PARTIAL HOSPITALIZATION PROGRAM [facility]  PHQ-2 Total Score  5  5  5  5  6   PHQ-9 Total Score  21  17  20  17  18       I reviewed the information below on 08/20/2018 have updated it Assessment and Plan: Mood disorder NOS versus bipolar 2 disorder-current episode depressed; seizure disorder; autism spectrum    Medication management with supportive therapy. Risks and benefits, side effects and alternative treatment options discussed with patient. Pt was given an opportunity to ask questions about medication, illness, and treatment. All current psychiatric medications have been reviewed and discussed with the patient and adjusted as clinically appropriate. The patient has been provided an accurate and updated list of the medications being now prescribed. Pt verbalized understanding and verbal consent obtained for treatment.  The risk of un-intended pregnancy is low based on the fact that pt reports she is using daily OCP. Pt is aware that these meds carry a teratogenic risk. Pt will discuss plan of action if she does or plans to become pregnant in the future.  Status of current problems: worsening in depression and anxiety due to COVID-19 frustrations  Meds: Cariprazine 4.5 mg p.o. daily Lamictal 150 mg p.o. twice daily Ativan 2mg  po BID  prn anxiety/mood  swings Emsam patch  to skin daily Continue current meds  Labs: none  Therapy: brief supportive therapy provided. Discussed psychosocial stressors in detail.  - talked about showerless wipes as option for good hygeine  Consultations: Encouraged to follow up with therapist-Dr. Jeanene Erb every other week started DBT thru [facility] (referred by therapist) at [facility] until COVID restrictions Supported employment on hold for now Encouraged to follow up with PCP as needed Pt is having ongoing ECT treatments every 1-2 weeks treatments. She is being assessed weekly and treatments may increase if needed  Pt's acute risk factors for suicide are ongoing depression with on/off passive SI. Pt's chronic risk factors are chronic mental illness. Pt's protective factors are social support. Pt reports less severe and intense SI without plan or intent that come on/off thru out the week and is an acute low risk for suicide. Patient told to call clinic if any problems occur. Patient advised to go to ER if they should develop SI/HI, side effects, or if symptoms worsen. Pt has crisis numbers to call if needed. Pt acknowledged and agreed with plan and verbalized understanding.  F/up in 4-5 weeks or sooner if needed  The duration of this appointment visit was 25 minutes of non face-to-face time with the patient.  Greater than 50% of this time was spent in counseling, explanation of  diagnosis, planning of further management, and coordination of care    Oletta Darter, MD 08/20/2018, 11:49 AM

## 2018-08-21 ENCOUNTER — Telehealth: Payer: Self-pay

## 2018-08-22 ENCOUNTER — Ambulatory Visit (INDEPENDENT_AMBULATORY_CARE_PROVIDER_SITE_OTHER): Payer: No Typology Code available for payment source | Admitting: Clinical

## 2018-08-22 ENCOUNTER — Other Ambulatory Visit: Payer: Self-pay | Admitting: Psychiatry

## 2018-08-22 DIAGNOSIS — F840 Autistic disorder: Secondary | ICD-10-CM | POA: Diagnosis not present

## 2018-08-24 ENCOUNTER — Other Ambulatory Visit: Payer: Self-pay

## 2018-08-24 ENCOUNTER — Encounter: Payer: Self-pay | Admitting: Anesthesiology

## 2018-08-24 ENCOUNTER — Ambulatory Visit
Admission: RE | Admit: 2018-08-24 | Discharge: 2018-08-24 | Disposition: A | Discharge status: Home / Self Care | Payer: No Typology Code available for payment source | Source: Ambulatory Visit | Attending: Psychiatry | Admitting: Psychiatry

## 2018-08-24 DIAGNOSIS — F840 Autistic disorder: Secondary | ICD-10-CM | POA: Insufficient documentation

## 2018-08-24 DIAGNOSIS — F329 Major depressive disorder, single episode, unspecified: Secondary | ICD-10-CM | POA: Insufficient documentation

## 2018-08-24 DIAGNOSIS — Z818 Family history of other mental and behavioral disorders: Secondary | ICD-10-CM | POA: Insufficient documentation

## 2018-08-24 DIAGNOSIS — Z803 Family history of malignant neoplasm of breast: Secondary | ICD-10-CM | POA: Diagnosis not present

## 2018-08-24 DIAGNOSIS — F419 Anxiety disorder, unspecified: Secondary | ICD-10-CM | POA: Diagnosis not present

## 2018-08-24 DIAGNOSIS — Z8049 Family history of malignant neoplasm of other genital organs: Secondary | ICD-10-CM | POA: Diagnosis not present

## 2018-08-24 DIAGNOSIS — Z833 Family history of diabetes mellitus: Secondary | ICD-10-CM | POA: Insufficient documentation

## 2018-08-24 DIAGNOSIS — F332 Major depressive disorder, recurrent severe without psychotic features: Secondary | ICD-10-CM

## 2018-08-24 DIAGNOSIS — Z91018 Allergy to other foods: Secondary | ICD-10-CM | POA: Diagnosis not present

## 2018-08-24 LAB — POCT PREGNANCY, URINE: Preg Test, Ur: NEGATIVE

## 2018-08-24 MED ORDER — MIDAZOLAM HCL 5 MG/5ML IJ SOLN
INTRAMUSCULAR | Status: AC
  Filled 2018-08-24: qty 5

## 2018-08-24 MED ORDER — KETOROLAC TROMETHAMINE 30 MG/ML IJ SOLN: 30.0000 mg | Freq: Once | INTRAMUSCULAR | Status: DC

## 2018-08-24 MED ORDER — METHOHEXITAL SODIUM 100 MG/10ML IV SOSY
PREFILLED_SYRINGE | INTRAVENOUS | Status: DC | PRN
  Administered 2018-08-24: 70 mg via INTRAVENOUS

## 2018-08-24 MED ORDER — LIDOCAINE HCL (PF) 2 % IJ SOLN
INTRAMUSCULAR | Status: AC
  Filled 2018-08-24: qty 10

## 2018-08-24 MED ORDER — MIDAZOLAM HCL 2 MG/2ML IJ SOLN: 3.0000 mg | Freq: Once | INTRAMUSCULAR | Status: DC

## 2018-08-24 MED ORDER — SODIUM CHLORIDE 0.9 % IV SOLN
500.0000 mL | Freq: Once | INTRAVENOUS | Status: AC
  Administered 2018-08-24: 11:00:00 via INTRAVENOUS

## 2018-08-24 MED ORDER — LIDOCAINE 2% (20 MG/ML) 5 ML SYRINGE
INTRAMUSCULAR | Status: DC | PRN
  Administered 2018-08-24: 60 mg via INTRAVENOUS

## 2018-08-24 MED ORDER — GLYCOPYRROLATE 0.2 MG/ML IJ SOLN: 0.1000 mg | Freq: Once | INTRAMUSCULAR | Status: DC

## 2018-08-24 MED ORDER — MIDAZOLAM HCL 2 MG/2ML IJ SOLN
INTRAMUSCULAR | Status: DC | PRN
  Administered 2018-08-24: 3 mg via INTRAVENOUS

## 2018-08-24 MED ORDER — SUCCINYLCHOLINE CHLORIDE 20 MG/ML IJ SOLN
INTRAMUSCULAR | Status: DC | PRN
  Administered 2018-08-24: 100 mg via INTRAVENOUS

## 2018-08-24 NOTE — Anesthesia Preprocedure Evaluation (Signed)
Anesthesia Evaluation  Patient identified by MRN, date of birth, ID band Patient awake    Reviewed: Allergy & Precautions, NPO status , Patient's Chart, lab work & pertinent test results  History of Anesthesia Complications Negative for: history of anesthetic complications  Airway Mallampati: II  TM Distance: >3 FB Neck ROM: Full    Dental no notable dental hx.    Pulmonary neg pulmonary ROS, neg sleep apnea, neg COPD,    breath sounds clear to auscultation- rhonchi (-) wheezing      Cardiovascular Exercise Tolerance: Good (-) hypertension(-) CAD, (-) Past MI, (-) Cardiac Stents and (-) CABG  Rhythm:Regular Rate:Normal - Systolic murmurs and - Diastolic murmurs    Neuro/Psych Seizures -, Well Controlled,  PSYCHIATRIC DISORDERS Anxiety Depression Bipolar Disorder    GI/Hepatic Neg liver ROS, GERD  ,  Endo/Other  negative endocrine ROSneg diabetes  Renal/GU negative Renal ROS     Musculoskeletal negative musculoskeletal ROS (+)   Abdominal (+) - obese,   Peds  Hematology negative hematology ROS (+)   Anesthesia Other Findings Past Medical History: No date: Anxiety No date: Autism No date: Depression No date: GERD (gastroesophageal reflux disease) No date: Heart murmur No date: Seizures (HCC) 04/09/2017: Tremor, essential   Reproductive/Obstetrics                             Anesthesia Physical  Anesthesia Plan  ASA: II  Anesthesia Plan: General   Post-op Pain Management:    Induction: Intravenous  PONV Risk Score and Plan: 2 and Ondansetron  Airway Management Planned: Mask  Additional Equipment:   Intra-op Plan:   Post-operative Plan:   Informed Consent: I have reviewed the patients History and Physical, chart, labs and discussed the procedure including the risks, benefits and alternatives for the proposed anesthesia with the patient or authorized representative who has  indicated his/her understanding and acceptance.     Dental advisory given  Plan Discussed with: CRNA and Anesthesiologist  Anesthesia Plan Comments:         Anesthesia Quick Evaluation

## 2018-08-24 NOTE — Transfer of Care (Signed)
Immediate Anesthesia Transfer of Care Note  Patient: Jill Alexander  Procedure(s) Performed: ECT TX  Patient Location: PACU  Anesthesia Type:General  Level of Consciousness: drowsy and patient cooperative  Airway & Oxygen Therapy: Patient Spontanous Breathing and Patient connected to face mask oxygen  Post-op Assessment: Report given to RN and Post -op Vital signs reviewed and stable  Post vital signs: Reviewed and stable  Last Vitals:  Vitals Value Taken Time  BP 116/69 08/24/2018 10:55 AM  Temp    Pulse 70 08/24/2018 10:56 AM  Resp 18 08/24/2018 10:56 AM  SpO2 100 % 08/24/2018 10:56 AM  Vitals shown include unvalidated device data.  Last Pain:  Vitals:   08/24/18 0952  TempSrc: Oral  PainSc: 0-No pain         Complications: No apparent anesthesia complications

## 2018-08-24 NOTE — H&P (Signed)
Jill BickerMadeline Sarah Alexander is an 23 y.o. female.   Chief Complaint: Patient with chronic depression and anxiety.  A little better than baseline.  No specific new complaint.  Minor memory problems HPI: History of recurrent severe depression part of autistic spectrum disorder  Past Medical History:  Diagnosis Date  . Anxiety   . Autism   . Depression   . GERD (gastroesophageal reflux disease)   . Heart murmur   . Seizures (HCC)   . 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 NO PAST SURGERIES    . UPPER GI ENDOSCOPY      Family History  Problem Relation Age of Onset  . Depression Father   . Anxiety disorder Sister   . Bipolar disorder Sister   . Diabetes Maternal Grandfather   . Cancer Maternal Grandfather   . Breast cancer Paternal Grandmother   . Uterine cancer Paternal Grandmother   . Irritable bowel syndrome Paternal Grandmother   . Colon cancer Neg Hx   . Esophageal cancer Neg Hx   . Stomach cancer Neg Hx   . Rectal cancer Neg Hx    Social History:  reports that she has never smoked. She has never used smokeless tobacco. She reports that she does not drink alcohol or use drugs.  Allergies:  Allergies  Allergen Reactions  . Gluten Meal     (Not in a hospital admission)   Results for orders placed or performed during the hospital encounter of 08/24/18 (from the past 48 hour(s))  Pregnancy, urine POC     Status: None   Collection Time: 08/24/18  9:40 AM  Result Value Ref Range   Preg Test, Ur NEGATIVE NEGATIVE    Comment:        THE SENSITIVITY OF THIS METHODOLOGY IS >24 mIU/mL    No results found.  Review of Systems  Constitutional: Negative.   HENT: Negative.   Eyes: Negative.   Respiratory: Negative.   Cardiovascular: Negative.   Gastrointestinal: Negative.   Musculoskeletal: Negative.   Skin: Negative.   Neurological: Negative.   Psychiatric/Behavioral: Positive for depression. Negative for hallucinations,  memory loss, substance abuse and suicidal ideas. The patient is nervous/anxious. The patient does not have insomnia.     Blood pressure 110/65, pulse (!) 57, temperature 98.9 F (37.2 C), temperature source Oral, resp. rate 16, SpO2 98 %. Physical Exam  Nursing note and vitals reviewed. Constitutional: She appears well-developed and well-nourished.  HENT:  Head: Normocephalic and atraumatic.  Eyes: Pupils are equal, round, and reactive to light. Conjunctivae are normal.  Neck: Normal range of motion.  Cardiovascular: Regular rhythm and normal heart sounds.  Respiratory: Effort normal.  GI: Soft.  Musculoskeletal: Normal range of motion.  Neurological: She is alert.  Skin: Skin is warm and dry.  Psychiatric: Judgment normal. Her affect is blunt. Her speech is delayed. She is slowed. Thought content is not paranoid. Cognition and memory are normal. She expresses no homicidal and no suicidal ideation.     Assessment/Plan We talked about her preference.  Patient certainly is encouraged to make her own decision about whether this is helpful and she wants to continue with treatment.  She feels like she has had some improvement but she still a little ambivalent.  We do want to proceed today and we will tentatively plan on 2 weeks but she should give it some thought and talk with her family if she prefers over the next couple weeks.  Mordecai Rasmussen, MD 08/24/2018, 10:32 AM

## 2018-08-24 NOTE — Anesthesia Postprocedure Evaluation (Signed)
Anesthesia Post Note  Patient: Jill Alexander  Procedure(s) Performed: ECT TX  Patient location during evaluation: PACU Anesthesia Type: General Level of consciousness: awake and alert Pain management: pain level controlled Vital Signs Assessment: post-procedure vital signs reviewed and stable Respiratory status: spontaneous breathing, nonlabored ventilation and respiratory function stable Cardiovascular status: blood pressure returned to baseline and stable Postop Assessment: no signs of nausea or vomiting Anesthetic complications: no     Last Vitals:  Vitals:   08/24/18 1125 08/24/18 1126  BP: 107/66 110/61  Pulse: 82 79  Resp: (!) 21 18  Temp: 36.6 C   SpO2: 98%     Last Pain:  Vitals:   08/24/18 1126  TempSrc:   PainSc: 0-No pain                 Jakota Manthei

## 2018-08-24 NOTE — Procedures (Signed)
ECT SERVICES Physician's Interval Evaluation & Treatment Note  Patient Identification: Jill Alexander MRN:  [id number] Date of Evaluation:  08/24/2018 TX #: [id number]  MADRS:   MMSE:   P.E. Findings:  No change to physical findings  Psychiatric Interval Note:  Blunted chronically mildly depressed now psychotic symptoms.  Subjective:  Patient is a 23 y.o. female seen for evaluation for Electroconvulsive Therapy. Feels on whole like she is a little better than before we started  Treatment Summary:   [x]   Right Unilateral             []  Bilateral   % Energy : 0.3 ms and 25%   Impedance: 1770 ohms  Seizure Energy Index: 14,823 V squared  Postictal Suppression Index: 87%  Seizure Concordance Index: 98%  Medications  Pre Shock: Robinul 0.1 mg Toradol 30 mg Brevital 70 mg succinylcholine 100 mg  Post Shock: Versed 3 mg  Seizure Duration: 46 seconds EMG 136 seconds EEG   Comments: Follow-up in 2 weeks if desired  Lungs:  [x]   Clear to auscultation               []  Other:   Heart:    [x]   Regular rhythm             []  irregular rhythm    [x]   Previous H&P reviewed, patient examined and there are NO CHANGES                 []   Previous H&P reviewed, patient examined and there are changes noted.   Mordecai Rasmussen, MD 4/27/202010:34 AM

## 2018-08-24 NOTE — Discharge Instructions (Signed)
1)  The drugs that you have been given will stay in your system until tomorrow so for the       next 24 hours you should not: ° A. Drive an automobile ° B. Make any legal decisions ° C. Drink any alcoholic beverages ° °2)  You may resume your regular meals upon return home. ° °3)  A responsible adult must take you home.  Someone should stay with you for a few          hours, then be available by phone for the remainder of the treatment day. ° °4)  You May experience any of the following symptoms: ° Headache, Nausea and a dry mouth (due to the medications you were given),  temporary memory loss and some confusion, or sore muscles (a warm bath  should help this).  If you you experience any of these symptoms let us know on                your return visit. ° °5)  Report any of the following: any acute discomfort, severe headache, or temperature        greater than 100.5 F.   Also report any unusual redness, swelling, drainage, or pain         at your IV site. ° °  You may report Symptoms to:  ECT PROGRAM- [place] [facility] °         Phone: [phone], ECT Department  °         or Dr. Clapac's office [phone] ° °6)  Your next ECT Treatment is when you call for another appointment  ° We will call 2 days prior to your scheduled appointment for arrival times. ° °7)  Nothing to eat or drink after midnight the night before your procedure. ° °8)  Take      With a sip of water the morning of your procedure. ° °9)  Other Instructions: Call [phone] to cancel the morning of your procedure due         to illness or emergency. ° °10) We will call within 72 hours to assess how you are feeling.  °

## 2018-08-24 NOTE — Anesthesia Post-op Follow-up Note (Signed)
Anesthesia QCDR form completed.        

## 2018-08-26 MED FILL — VRAYLAR 4.5 MG CAPSULE: 4.5 | 30 days supply | Qty: 30 | Fill #1

## 2018-09-01 ENCOUNTER — Ambulatory Visit: Payer: Medicaid Other | Admitting: Clinical

## 2018-09-05 ENCOUNTER — Ambulatory Visit: Payer: No Typology Code available for payment source | Admitting: Clinical

## 2018-09-07 ENCOUNTER — Telehealth (HOSPITAL_COMMUNITY): Payer: Self-pay

## 2018-09-07 NOTE — Telephone Encounter (Signed)
Patient called and stated that she has been drinking a lot of organic tea called "sunshine tea or cup of sunshine" that has been helping with her anxiety. She stated that she looked on the box and it said to consult with her health care provider before using and that it is not to be used with antidepressants or antianxiety medications. She would like to know if she should continue taking it and would like advice/input on this. Please review and advise. Thank you.

## 2018-09-10 NOTE — Telephone Encounter (Signed)
How much is "a lot". I think it would be ok to have 2-3 cups/day at most.

## 2018-09-11 ENCOUNTER — Ambulatory Visit (INDEPENDENT_AMBULATORY_CARE_PROVIDER_SITE_OTHER): Payer: No Typology Code available for payment source | Admitting: Clinical

## 2018-09-11 DIAGNOSIS — F840 Autistic disorder: Secondary | ICD-10-CM

## 2018-09-14 MED FILL — EMSAM 6 MG/24 HOURS PATCH: 6 | 30 days supply | Qty: 30 | Fill #2

## 2018-09-14 NOTE — Telephone Encounter (Signed)
Called patient and spoke with her about the "sunshine tea." Relayed message to her that 2-3 cups/day of the tea should be fine. Patient accepted that suggestion.

## 2018-09-15 ENCOUNTER — Ambulatory Visit: Payer: Medicaid Other | Admitting: Clinical

## 2018-09-16 ENCOUNTER — Other Ambulatory Visit (HOSPITAL_COMMUNITY): Payer: Self-pay | Admitting: Psychiatry

## 2018-09-16 DIAGNOSIS — F0630 Mood disorder due to known physiological condition, unspecified: Secondary | ICD-10-CM

## 2018-09-16 DIAGNOSIS — F3181 Bipolar II disorder: Secondary | ICD-10-CM

## 2018-09-17 ENCOUNTER — Ambulatory Visit (INDEPENDENT_AMBULATORY_CARE_PROVIDER_SITE_OTHER): Payer: No Typology Code available for payment source | Admitting: Psychiatry

## 2018-09-17 ENCOUNTER — Encounter (HOSPITAL_COMMUNITY): Payer: Self-pay | Admitting: Psychiatry

## 2018-09-17 ENCOUNTER — Other Ambulatory Visit: Payer: Self-pay

## 2018-09-17 DIAGNOSIS — F0630 Mood disorder due to known physiological condition, unspecified: Secondary | ICD-10-CM

## 2018-09-17 DIAGNOSIS — F410 Panic disorder [episodic paroxysmal anxiety] without agoraphobia: Secondary | ICD-10-CM | POA: Diagnosis not present

## 2018-09-17 DIAGNOSIS — F3181 Bipolar II disorder: Secondary | ICD-10-CM | POA: Diagnosis not present

## 2018-09-17 MED ORDER — SELEGILINE 9 MG/24HR TD PT24: 9.0000 mg | MEDICATED_PATCH | Freq: Every day | TRANSDERMAL | 1 refills | Status: DC

## 2018-09-17 MED FILL — EMSAM 9 MG/24 HOURS PATCH: 9 | 30 days supply | Qty: 30 | Fill #0

## 2018-09-17 NOTE — Progress Notes (Signed)
Virtual Visit via Telephone Note  I connected with Jill Alexander on 09/17/18 at  9:00 AM EDT by telephone and verified that I am speaking with the correct person using two identifiers.  Location: Patient: home Provider: home   I discussed the limitations, risks, security and privacy concerns of performing an evaluation and management service by telephone and the availability of in person appointments. I also discussed with the patient that there may be a patient responsible charge related to this service. The patient expressed understanding and agreed to proceed.   09/17/2018 9:35 AM Jill Alexander Jill Alexander  MRN:  [id number]  Chief Complaint:  Chief Complaint    Depression; Anxiety     HPI: "I am not doing great". Pt reports she was doing ECT and it seemed to be helping. Since the COVID-19 restrictions it has not been as helpful. She had her last ECT at the end of April and is taking a few weeks off. She is not sure when she will restart. Jill Alexander is going to talk to her parents about it some more.  She has been having large mood swings. It occurs in the mornings after breakfast more than other parts of the day. Most days she wakes up "upset" and gets depressed after eating breakfast. It is hard to tell if it is due to ongoing stressors or if it organic depression. Pt does not think the meds are helping. The EMSAM patch doesn't seem to be helping. She is very tired in general. Sleep is poor. Jill Alexander is able to fall asleep quickly but wakes early and can't go back to sleep. It has been occurring over the last several weeks. Some days she takes a nap. She is not able to go out and really hasn't had motivation to do much. She is attempting to do some small chores. Most of the day she is just "hanging out". Jill Alexander has been talking with her therapist frequently. Her mom has made up a list of ways to deal with negative thoughts and emotions. It is does help if she completes the list. Pt denies SIB. Pt is  reporting ongoing SI without plan or intent. The thoughts come and go. She denies HI. Her father is working from home and mom is home as well. Jill Alexander has a job interview at a [employer]. She feels confident about how to interview but not about getting the job.    Visit Diagnosis:    ICD-10-CM   1. Mood disorder in conditions classified elsewhere F06.30 selegiline (EMSAM) 9 MG/24HR  2. Bipolar 2 disorder (HCC) F31.81   3. Panic attacks F41.0       Past Psychiatric History: Patient denies any history of prior psychiatric hospitalizations.  She was treated with medications in the past-Lamictal, Abilify- flat and lethargic, Wellbutrin, Prozac, Ativan, Lithium.  Prior to coming to [facility] outpatient patient was receiving treatment at [facility]. In [place] pt worked with one psychiatrist for over 10 yrs. Pt denies any hx of previous suicide attempts.   Past Medical History:  Past Medical History:  Diagnosis Date  . Anxiety   . Autism   . Depression   . GERD (gastroesophageal reflux disease)   . Heart murmur   . Seizures (HCC)   . 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 NO PAST SURGERIES    . UPPER GI ENDOSCOPY      Family Psychiatric  History:  Family History  Problem Relation Age  of Onset  . Depression Father   . Anxiety disorder Sister   . Bipolar disorder Sister   . Diabetes Maternal Grandfather   . Cancer Maternal Grandfather   . Breast cancer Paternal Grandmother   . Uterine cancer Paternal Grandmother   . Irritable bowel syndrome Paternal Grandmother   . Colon cancer Neg Hx   . Esophageal cancer Neg Hx   . Stomach cancer Neg Hx   . Rectal cancer Neg Hx     Social History:  Social History   Socioeconomic History  . Marital status: Single    Spouse name: Not on file  . Number of children: 0  . Years of education: Not on file  . Highest education level: Some college, no degree  Occupational History  . Not  on file  Social Needs  . Financial resource strain: Not hard at all  . Food insecurity:    Worry: Never true    Inability: Never true  . Transportation needs:    Medical: Yes    Non-medical: Yes  Tobacco Use  . Smoking status: Never Smoker  . Smokeless tobacco: Never Used  Substance and Sexual Activity  . Alcohol use: No  . Drug use: No  . Sexual activity: Not Currently  Lifestyle  . Physical activity:    Days per week: 3 days    Minutes per session: 30 min  . Stress: Rather much  Relationships  . Social connections:    Talks on phone: Never    Gets together: More than three times a week    Attends religious service: 1 to 4 times per year    Active member of club or organization: No    Attends meetings of clubs or organizations: Never    Relationship status: Never married  Other Topics Concern  . Not on file  Social History Narrative   Lives with parents in [place]. Pt moved here from [place] in 2018.   Caffeine use: 2-3 cups per day   Left handed     Allergies:  Allergies  Allergen Reactions  . Gluten Meal     Metabolic Disorder Labs: No results found for: HGBA1C, MPG No results found for: PROLACTIN No results found for: CHOL, TRIG, HDL, CHOLHDL, VLDL, LDLCALC Lab Results  Component Value Date   TSH 1.340 11/17/2017   TSH 0.677 08/21/2017    Therapeutic Level Labs: Lab Results  Component Value Date   LITHIUM 0.8 01/05/2018   LITHIUM 0.5 (L) 12/18/2017   No results found for: VALPROATE No components found for:  CBMZ  Current Medications: Current Outpatient Medications  Medication Sig Dispense Refill  . acetaminophen (TYLENOL) 500 MG tablet Take 500 mg by mouth every 6 (six) hours as needed.    . Cariprazine HCl 4.5 MG CAPS Take 1 capsule (4.5 mg total) by mouth daily. 30 capsule 1  . Ibuprofen (MOTRIN PO) Take 200 mg by mouth as needed.     . lamoTRIgine (LAMICTAL) 150 MG tablet Take 1 tablet (150 mg total) by mouth 2 (two) times daily. 60  tablet 1  . LORazepam (ATIVAN) 2 MG tablet Take 1 tablet (2 mg total) by mouth 2 (two) times daily. 60 tablet 1  . Multiple Vitamins-Minerals (MULTIVITAMIN PO) Take 1 tablet by mouth daily.    . Norethin Ace-Eth Estrad-FE (TAYTULLA) 1-20 MG-MCG(24) CAPS Take 1 tablet by mouth daily. 28 capsule 6  . omeprazole (PRILOSEC) 40 MG capsule TAKE 1 CAPSULE BY MOUTH TWICE A DAY 60 capsule 1  .  ondansetron (ZOFRAN ODT) 8 MG disintegrating tablet Take 1 tablet (8 mg total) by mouth 2 (two) times daily. 60 tablet 3  . polyethylene glycol (MIRALAX) packet Take 17 g by mouth daily as needed. 14 each 0  . selegiline (EMSAM) 9 MG/24HR Place 1 patch (9 mg total) onto the skin daily. 30 patch 1   No current facility-administered medications for this visit.     MSE: I spoke with [name] on the phone.  She was calm and engaged in our conversation.  Her speech is monotone as per her usual.  Rate is slow and volume is normal.  Thought processes are coherent and circumstantial.  Thought content is logical.  Although she reports depressed mood and affect is congruent.  She denies HI.  She reports passive SI without plan or intent.  She denies AVH and did not appear to be responding to internal stimuli.  Attention and memory are good.  Fund of knowledge and use of language are good.  Insight and judgment are good.  I am unable to comment on physical appearance, eye contact or psychomotor activity as I was unable to physically see the patient.       Screenings: ECT-MADRS     ECT [facility] Treatment [facility]2018 [facility] Treatment [facility]06/2018 [facility]  MADRS Total Score  24  20  37    GAD-7     Counselor from 06/01/2018 in [facility] Counselor from 05/22/2018 in [facility]PITAL[facility] PROGRAM [facility]  Total GAD-7 Score  18  19  15  14  18     Mini-Mental     ECT [facility]2018 [facility]CT [facility]2018 [facility]  Total Score (max 30 points )  30  30  30     PHQ2-9     Counselor from 06/01/2018 in [facility]PITAL[facility] PROGRAM Counselor from 05/22/2018 in [facility]ITAL[facility] PROGRAM [facility]  PHQ-2 Total Score  5  5  5  5  6   PHQ-9 Total Score  21  17  20  17  18       I reviewed the information below on 09/17/2018 and have updated it Assessment and Plan: Mood disorder NOS versus bipolar 2 disorder-current episode depressed; seizure disorder; autism spectrum    Medication management with supportive therapy. Risks and benefits, side effects and alternative treatment options discussed with patient. Pt was given an opportunity to ask questions about medication, illness, and treatment. All current psychiatric medications have been reviewed and discussed with the patient and adjusted as clinically appropriate. The patient has been provided an accurate and updated list of the medications being now prescribed. Pt verbalized understanding and verbal consent obtained for treatment.  The risk of un-intended pregnancy is low based on the fact that pt reports she is using daily OCP. Pt is aware that these meds carry a teratogenic risk.  Pt will discuss plan of action if she does or plans to become pregnant in the future.  Status of  current problems: depression is worse  Meds: Cariprazine 4.5 mg p.o. daily Lamictal 150 mg p.o. twice daily Ativan  po BID prn anxiety/mood swings Increase Emsam patch  to skin daily Unclear if patient is potentially a fast metabolizer? ECT treatment  Labs: none  Therapy: brief supportive therapy provided. Discussed psychosocial stressors in detail.    Consultations: Encouraged to follow up with therapist-Dr. Jeanene Erb every other week started DBT thru [facility] (referred by therapist) at [facility] until COVID restrictions started. She is hoping it will restart Supported employment on hold for now Encouraged to follow up with PCP as needed   Pt's acute risk factors for suicide are ongoing depression with on/off passive SI. Pt's chronic risk factors are chronic mental illness. Pt's protective factors are social support and engaging in treatment.  Today pt reports less severe and intense SI without plan or intent that come on/off thru out the week and is an acute low risk for suicide. Patient told to call clinic if any problems occur. Patient advised to go to ER if they should develop SI/HI, side effects, or if symptoms worsen. Pt has crisis numbers to call if needed. Pt acknowledged and agreed with plan and verbalized understanding.  F/up in 3 weeks or sooner if needed  The duration of this appointment visit was 30 minutes of non face-to-face time with the patient.  Greater than 50% of this time was spent in counseling, explanation of  diagnosis, planning of further management, and coordination of care   Oletta Darter, MD 09/17/2018, 9:35 AM

## 2018-09-19 ENCOUNTER — Ambulatory Visit (INDEPENDENT_AMBULATORY_CARE_PROVIDER_SITE_OTHER): Payer: No Typology Code available for payment source | Admitting: Clinical

## 2018-09-19 DIAGNOSIS — F840 Autistic disorder: Secondary | ICD-10-CM

## 2018-09-23 ENCOUNTER — Other Ambulatory Visit: Payer: Self-pay | Admitting: Gastroenterology

## 2018-09-26 ENCOUNTER — Other Ambulatory Visit (HOSPITAL_COMMUNITY): Payer: Self-pay | Admitting: Psychiatry

## 2018-09-26 ENCOUNTER — Ambulatory Visit (INDEPENDENT_AMBULATORY_CARE_PROVIDER_SITE_OTHER): Payer: No Typology Code available for payment source | Admitting: Clinical

## 2018-09-26 DIAGNOSIS — F0630 Mood disorder due to known physiological condition, unspecified: Secondary | ICD-10-CM

## 2018-09-26 DIAGNOSIS — F3181 Bipolar II disorder: Secondary | ICD-10-CM

## 2018-09-26 DIAGNOSIS — F410 Panic disorder [episodic paroxysmal anxiety] without agoraphobia: Secondary | ICD-10-CM

## 2018-09-26 DIAGNOSIS — F840 Autistic disorder: Secondary | ICD-10-CM

## 2018-09-29 ENCOUNTER — Telehealth (HOSPITAL_COMMUNITY): Payer: Self-pay

## 2018-09-29 NOTE — Telephone Encounter (Signed)
Patient called regarding her Emsam patch. She was concerned about food and medication interaction with the patch. I spoke with Regan and called the patient back to reassure her that she is on a low dose patch and that it should not interfere with her medication or food (per Regan).

## 2018-10-01 ENCOUNTER — Ambulatory Visit (HOSPITAL_COMMUNITY): Payer: No Typology Code available for payment source | Admitting: Psychiatry

## 2018-10-02 ENCOUNTER — Other Ambulatory Visit (HOSPITAL_COMMUNITY): Payer: Self-pay | Admitting: Psychiatry

## 2018-10-02 DIAGNOSIS — F3181 Bipolar II disorder: Secondary | ICD-10-CM

## 2018-10-02 DIAGNOSIS — F0630 Mood disorder due to known physiological condition, unspecified: Secondary | ICD-10-CM

## 2018-10-02 MED FILL — LORazepam 2 MG TABS: 2 | 30 days supply | Qty: 60 | Fill #0

## 2018-10-02 MED FILL — VRAYLAR 4.5 MG CAPSULE: 4.5 | 30 days supply | Qty: 30 | Fill #0

## 2018-10-03 ENCOUNTER — Ambulatory Visit (INDEPENDENT_AMBULATORY_CARE_PROVIDER_SITE_OTHER): Payer: No Typology Code available for payment source | Admitting: Clinical

## 2018-10-03 DIAGNOSIS — F840 Autistic disorder: Secondary | ICD-10-CM

## 2018-10-05 ENCOUNTER — Other Ambulatory Visit: Payer: Self-pay | Admitting: Gastroenterology

## 2018-10-08 ENCOUNTER — Ambulatory Visit (HOSPITAL_COMMUNITY): Payer: No Typology Code available for payment source | Admitting: Psychiatry

## 2018-10-10 ENCOUNTER — Ambulatory Visit (INDEPENDENT_AMBULATORY_CARE_PROVIDER_SITE_OTHER): Payer: No Typology Code available for payment source | Admitting: Clinical

## 2018-10-10 DIAGNOSIS — F840 Autistic disorder: Secondary | ICD-10-CM

## 2018-10-12 ENCOUNTER — Telehealth (HOSPITAL_COMMUNITY): Payer: Self-pay | Admitting: Professional

## 2018-10-13 ENCOUNTER — Telehealth (HOSPITAL_COMMUNITY): Payer: Self-pay | Admitting: Professional

## 2018-10-14 ENCOUNTER — Telehealth (HOSPITAL_COMMUNITY): Payer: Self-pay | Admitting: Professional

## 2018-10-15 ENCOUNTER — Encounter (HOSPITAL_COMMUNITY): Payer: Self-pay | Admitting: Psychiatry

## 2018-10-15 ENCOUNTER — Ambulatory Visit (INDEPENDENT_AMBULATORY_CARE_PROVIDER_SITE_OTHER): Payer: No Typology Code available for payment source | Admitting: Psychiatry

## 2018-10-15 ENCOUNTER — Other Ambulatory Visit: Payer: Self-pay

## 2018-10-15 DIAGNOSIS — F3181 Bipolar II disorder: Secondary | ICD-10-CM

## 2018-10-15 DIAGNOSIS — F410 Panic disorder [episodic paroxysmal anxiety] without agoraphobia: Secondary | ICD-10-CM | POA: Diagnosis not present

## 2018-10-15 DIAGNOSIS — F0630 Mood disorder due to known physiological condition, unspecified: Secondary | ICD-10-CM | POA: Diagnosis not present

## 2018-10-15 MED ORDER — LAMOTRIGINE 150 MG PO TABS: 150.0000 mg | ORAL_TABLET | Freq: Two times a day (BID) | ORAL | 1 refills | Status: DC

## 2018-10-15 MED ORDER — LORAZEPAM 2 MG PO TABS: 2.0000 mg | ORAL_TABLET | Freq: Two times a day (BID) | ORAL | 1 refills | Status: DC

## 2018-10-15 MED ORDER — VRAYLAR 4.5 MG PO CAPS: 1.0000 | ORAL_CAPSULE | Freq: Every day | ORAL | 1 refills | Status: DC

## 2018-10-15 NOTE — Progress Notes (Signed)
Virtual Visit via Telephone Note  I connected with Jill Alexander on 10/15/18 at 11:15 AM EDT by telephone and verified that I am speaking with the correct person using two identifiers.  Location: Patient: home Provider: office   I discussed the limitations, risks, security and privacy concerns of performing an evaluation and management service by telephone and the availability of in person appointments. I also discussed with the patient that there may be a patient responsible charge related to this service. The patient expressed understanding and agreed to proceed.   History of Present Illness: Mom states that Jill Alexander is "having a setback". Mom says her mood is not improving. Therapy and DBT happening weekly. Pt has periods of sadness and crying multiple times a day. Jill Alexander is trying to get a job which would be good for her. Her life has been on hold. Its been 2 yrs since we moved here. I don't want her life to be like this". Some days she can't bathe and get dressed. Mom is not sure if it stress related to current world problems, autism or personality or mood. Pt has worked on Pharmacologistcoping skills and has many tools. It does help some if she starts working on it before the symptoms peak. Pt needs constant bolstering. Jill Alexander internalized everything and has trouble dealing with it. ECT seemed to help in the beginning but after that coronavirus started and mom didn't see any gains. Mom says ECT is still a possibility but wants to try other things.   Pt states the depression is worse. She has large mood swings. Jill Alexander will have sudden crying spells thru out the day. Jill Alexander has not been identify a trigger. Some days she has not motivation and will not change or shower or brush her teeth. The lack of social interaction is taking a toll. She is tired of feeling depressed and nothing working. "Why can't I get better?".  Pt has ongoing passive SI but denies plan or intent. Pt reports she has hit herself when  frustrated but denies any cutting. She denies HI. Jill Alexander is trying to get into PHP again. She has a phone assessment next week. It is in an effort to increase her therapy. Emsam is not helping at all.    Observations/Objective: I spoke with Jill BickerMadeline Sarah Alexander on the phone.  Pt was calm, pleasant and cooperative.  Pt was engaged in the conversation and answered questions appropriately.  Speech was clear and coherent with normal rate and volume.  Speech is monotone mood is depressed, affect is congruent. Thought processes are coherent and intact.  Thought content is logical.  Pt denies SI/HI.   Pt denies auditory and visual hallucinations and did not appear to be responding to internal stimuli.  Memory and concentration are good.  Fund of knowledge and use of language are average.  Insight and judgment are fair.  I am unable to comment on psychomotor activity, general appearance, hygiene, or eye contact as I was unable to physically see the patient on the phone.   Assessment and Plan: Mood d/o NOS vs Bipolar 2 d/o- current episode depressed, severe without psychotic features; seizure d/o; autism spectrum  Cariprazine 4.5mg  po qD Lamictal 150mg  po BID Ativan 2mg  po BID prn anxiety/mood swings D/c Emsam patch  [facility]   Unclear if pt is a potential fast metabolizers  ECT treatments still an option  Encouraged to continue individual therapy, DBT and supported employment  Follow Up Instructions: 3-4  weeks or sooner if needed   I discussed the assessment and treatment plan with the patient. The patient was provided an opportunity to ask questions and all were answered. The patient agreed with the plan and demonstrated an understanding of the instructions.   The patient was advised to call back or seek an in-person evaluation if the symptoms worsen or if the condition fails to improve as anticipated.  I provided 30 minutes of  non-face-to-face time during this encounter.   Charlcie Cradle, MD

## 2018-10-17 ENCOUNTER — Ambulatory Visit (INDEPENDENT_AMBULATORY_CARE_PROVIDER_SITE_OTHER): Payer: No Typology Code available for payment source | Admitting: Clinical

## 2018-10-17 DIAGNOSIS — F840 Autistic disorder: Secondary | ICD-10-CM

## 2018-10-21 ENCOUNTER — Encounter (HOSPITAL_COMMUNITY): Payer: Self-pay

## 2018-10-21 ENCOUNTER — Other Ambulatory Visit (HOSPITAL_COMMUNITY)
Payer: No Typology Code available for payment source | Attending: Psychiatry | Admitting: Licensed Clinical Social Worker

## 2018-10-21 ENCOUNTER — Other Ambulatory Visit: Payer: Self-pay

## 2018-10-21 DIAGNOSIS — F419 Anxiety disorder, unspecified: Secondary | ICD-10-CM | POA: Insufficient documentation

## 2018-10-21 DIAGNOSIS — F840 Autistic disorder: Secondary | ICD-10-CM | POA: Insufficient documentation

## 2018-10-21 DIAGNOSIS — F3181 Bipolar II disorder: Secondary | ICD-10-CM | POA: Insufficient documentation

## 2018-10-21 NOTE — Psych (Signed)
Virtual Visit via Video Note  I connected with Jill Alexander on 10/21/18 at 10:00 AM EDT by a video enabled telemedicine application and verified that I am speaking with the correct person using two identifiers.   I discussed the limitations of evaluation and management by telemedicine and the availability of in person appointments. The patient expressed understanding and agreed to proceed.    I discussed the assessment and treatment plan with the patient. The patient was provided an opportunity to ask questions and all were answered. The patient agreed with the plan and demonstrated an understanding of the instructions.   The patient was advised to call back or seek an in-person evaluation if the symptoms worsen or if the condition fails to improve as anticipated.  I provided 60 minutes of non-face-to-face time during this encounter.   [name], [facility]      Comprehensive Clinical Assessment (CCA) Note  10/21/2018 Jill Alexander [id number]  Visit Diagnosis:      ICD-10-CM   1. Bipolar 2 disorder (HCC)  F31.81   2. Autism spectrum disorder  F84.0       CCA Part One  Part One has been completed on paper by the patient.  (See scanned document in Chart Review)  CCA Part Two A  Intake/Chief Complaint:  CCA Intake With Chief Complaint CCA Part Two Date: 10/21/18 CCA Part Two Time: 1000 Chief Complaint/Presenting Problem: Pt reports for ax for PHP per Dr. Gery PrayMendleson. Pt shares she has anxiety and depression. Pt is dx with autism. Pt shares she has a hx of SI but no current plan/intent.  Pt states she has struggled since moving to [place] from [place] in 07/18 due to lack of social supports. Pt was working on making more strides with social supports when COVID-19 quarantine started. Other stressors include father recently losing his job and looking for a new one; father also had surgery recently and is relying on more help from pt. Pt reports increased conflict with  father since being "trapped" in the same house. Pt continues to have "outbursts" and "loses temper". Pt reports hx of counseling and psychiatry; pt is currently followed by Dr. Michae KavaAgarwal for psychiatry and Luanna ColeJenna Mendleson for counseling. Pt is also [facility] and is using a therapy light. Pt denies HI/AVH Patients Currently Reported Symptoms/Problems: increased depression and anxiety; isolation; outbursts; mood swings; decreased sleep; memory problems due to recent ECT; passive SI; irritability; excessive worrying; low energy; obsessive thoughts Individual's Strengths: Family Support, kind, motivation for tx Individual's Abilities: able bodied, makes connections Type of Services Patient Feels Are Needed: Group Therapy Initial Clinical Notes/Concerns: Pt reports hx of ASD. Pt appears to be able to make connections and may benefit from group.  Mental Health Symptoms Depression:  Depression: Difficulty Concentrating, Change in energy/activity, Irritability, Sleep (too much or little), Tearfulness, Worthlessness, Hopelessness, Fatigue  Mania:     Anxiety:   Anxiety: Difficulty concentrating, Irritability, Restlessness, Sleep, Worrying, Tension, Fatigue  Psychosis:     Trauma:     Obsessions:     Compulsions:     Inattention:     Hyperactivity/Impulsivity:     Oppositional/Defiant Behaviors:     Borderline Personality:     Other Mood/Personality Symptoms:      Mental Status Exam Appearance and self-care  Stature:  Stature: Small  Weight:  Weight: Average weight  Clothing:  Clothing: Disheveled, Casual  Grooming:  Grooming: Neglected  Cosmetic use:  Cosmetic Use: None  Posture/gait:  Posture/Gait: Stooped  Motor activity:  Motor Activity: Tremor, Agitated  Sensorium  Attention:  Attention: Distractible, Confused  Concentration:  Concentration: Anxiety interferes  Orientation:  Orientation: X5  Recall/memory:  Recall/Memory: Defective in short-term   Affect and Mood  Affect:  Affect: Anxious, Tearful  Mood:  Mood: Anxious, Irritable  Relating  Eye contact:  Eye Contact: Avoided  Facial expression:  Facial Expression: Constricted, Anxious  Attitude toward examiner:  Attitude Toward Examiner: Cooperative  Thought and Language  Speech flow: Speech Flow: Normal  Thought content:  Thought Content: Appropriate to mood and circumstances  Preoccupation:     Hallucinations:     Organization:     Company secretaryxecutive Functions  Fund of Knowledge:  Fund of Knowledge: Average  Intelligence:  Intelligence: Average  Abstraction:  Abstraction: Normal  Judgement:  Judgement: Fair  Dance movement psychotherapisteality Testing:  Reality Testing: Adequate  Insight:  Insight: Flashes of insight  Decision Making:  Decision Making: Paralyzed  Social Functioning  Social Maturity:  Social Maturity: Self-centered, Isolates  Social Judgement:  Social Judgement: Naive  Stress  Stressors:  Stressors: Family conflict, Transitions  Coping Ability:  Coping Ability: Deficient supports, Horticulturist, commercialxhausted  Skill Deficits:     Supports:      Family and Psychosocial History: Family history Marital status: Single Are you sexually active?: No Does patient have children?: No  Childhood History:  Childhood History By whom was/is the patient raised?: Both parents Additional childhood history information: moved frequently, lived internationally in [place] and [place]. Description of patient's relationship with caregiver when they were a child: Patient reports good relationship with both parents. Patient's description of current relationship with people who raised him/her: Pt reports good relationships with parents; reports more arguing with father Does patient have siblings?: Yes Number of Siblings: 2 Description of patient's current relationship with siblings: older sister and brother Did patient suffer any verbal/emotional/physical/sexual abuse as a child?: No Has patient ever been sexually  abused/assaulted/raped as an adolescent or adult?: No Witnessed domestic violence?: No Has patient been effected by domestic violence as an adult?: No  CCA Part Two B  Employment/Work Situation: Employment / Work Psychologist, occupationalituation Employment situation: Biomedical scientistUnemployed Patient's job has been impacted by current illness: (Pt has not been able to get a job due to symptoms) Did You Receive Any Psychiatric Treatment/Services While in [employer]?: No Are There Guns or Other Weapons in Your Home?: No  Education: Education Last Grade Completed: 12 Did Garment/textile technologistYou Graduate From [employer]?: Yes Did Theme park managerYou Attend College?: Yes Did You Attend Graduate School?: No Did You Have Any Difficulty At [employer] EnergySchool?: Yes  Religion: Religion/Spirituality Are You A Religious Person?: No  Leisure/Recreation: Leisure / Recreation Leisure and Hobbies: books, reading, fantasy  Exercise/Diet: Exercise/Diet Do You Exercise?: Yes What Type of Exercise Do You Do?: Run/Walk How Many Times a Week Do You Exercise?: Daily Have You Gained or Lost A Significant Amount of Weight in the Past Six Months?: No Do You Follow a Special Diet?: No Do You Have Any Trouble Sleeping?: Yes Explanation of Sleeping Difficulties: trouble falling and staying asleep  CCA Part Two C  Alcohol/Drug Use: Alcohol / Drug Use Pain Medications: see MAR Prescriptions: see MAR Over the Counter: see MAR History of alcohol / drug use?: No history of alcohol / drug abuse                      CCA Part Three  ASAM's:  Six Dimensions of Multidimensional Assessment  Dimension 1:  Acute Intoxication and/or Withdrawal Potential:     Dimension 2:  Biomedical Conditions and Complications:     Dimension 3:  Emotional, Behavioral, or Cognitive Conditions and Complications:     Dimension 4:  Readiness to Change:     Dimension 5:  Relapse, Continued use, or Continued Problem Potential:     Dimension 6:  Recovery/Living Environment:      Substance use  Disorder (SUD)    Social Function:  Social Functioning Social Maturity: Self-centered, Isolates Social Judgement: Naive  Stress:  Stress Stressors: Family conflict, Transitions Coping Ability: Deficient supports, Exhausted Patient Takes Medications The Way The Doctor Instructed?: Yes Priority Risk: Moderate Risk  Risk Assessment- Self-Harm Potential: Risk Assessment For Self-Harm Potential Thoughts of Self-Harm: Vague current thoughts Method: No plan Additional Comments for Self-Harm Potential: Pt reports she has SI when frustrated but denies intent/plan  Risk Assessment -Dangerous to Others Potential: Risk Assessment For Dangerous to Others Potential Method: No Plan  DSM5 Diagnoses: Patient Active Problem List   Diagnosis Date Noted  . Bipolar 2 disorder ([place]) 06/02/2018  . Panic attacks 06/02/2018  . Autism spectrum disorder 05/06/2018  . Anxiety and depression 05/06/2018  . 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 Seizures ([place]) 04/09/2017  . Tremor, 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    Patient Centered Plan: Patient is on the following Treatment Plan(s):  Depression  Recommendations for Services/Supports/Treatments: Recommendations for Services/Supports/Treatments Recommendations For Services/Supports/Treatments: Partial Hospitalization(Pt was referred to [facility] per individual therapist, Dr. Mamie Levers. Pt reports she has increased anxiety/depression since COVID-19 isolation; Pt wants to work on Radiographer, therapeutic and managing symptoms while isolated.)  Treatment Plan Summary:  Pt states "I want to use my coping skills and get more. I need help with those thought distortion things."  Referrals to Alternative Service(s): Referred to Alternative Service(s):   Place:   Date:   Time:    Referred to Alternative Service(s):   Place:   Date:   Time:    Referred to Alternative Service(s):   Place:   Date:   Time:    Referred to Alternative Service(s):   Place:   Date:    Time:     Royetta Crochet, [facility], LCASA

## 2018-10-23 ENCOUNTER — Ambulatory Visit (INDEPENDENT_AMBULATORY_CARE_PROVIDER_SITE_OTHER): Payer: Self-pay | Admitting: Clinical

## 2018-10-23 ENCOUNTER — Telehealth: Payer: Self-pay

## 2018-10-23 DIAGNOSIS — F840 Autistic disorder: Secondary | ICD-10-CM

## 2018-10-23 NOTE — Telephone Encounter (Signed)
Virtual visit please. 

## 2018-10-23 NOTE — Telephone Encounter (Signed)
Pt has stated she think she is worried about eating too much. She stated she feels nauseous after eating. She stated she has seen GI before for Acid Reflux. Would you like patient to schedule virtual or come into the office?

## 2018-10-23 NOTE — Telephone Encounter (Signed)
Virtual has been scheduled.

## 2018-10-26 ENCOUNTER — Ambulatory Visit (INDEPENDENT_AMBULATORY_CARE_PROVIDER_SITE_OTHER): Payer: No Typology Code available for payment source | Admitting: Family Medicine

## 2018-10-26 ENCOUNTER — Encounter: Payer: Self-pay | Admitting: Family Medicine

## 2018-10-26 ENCOUNTER — Other Ambulatory Visit: Payer: Self-pay

## 2018-10-26 VITALS — Wt 141.0 lb

## 2018-10-26 DIAGNOSIS — R1013 Epigastric pain: Secondary | ICD-10-CM | POA: Diagnosis not present

## 2018-10-26 DIAGNOSIS — K219 Gastro-esophageal reflux disease without esophagitis: Secondary | ICD-10-CM

## 2018-10-26 DIAGNOSIS — R42 Dizziness and giddiness: Secondary | ICD-10-CM

## 2018-10-26 DIAGNOSIS — R51 Headache: Secondary | ICD-10-CM | POA: Diagnosis not present

## 2018-10-26 DIAGNOSIS — R519 Headache, unspecified: Secondary | ICD-10-CM | POA: Insufficient documentation

## 2018-10-26 DIAGNOSIS — F329 Major depressive disorder, single episode, unspecified: Secondary | ICD-10-CM

## 2018-10-26 DIAGNOSIS — F3181 Bipolar II disorder: Secondary | ICD-10-CM

## 2018-10-26 DIAGNOSIS — F419 Anxiety disorder, unspecified: Secondary | ICD-10-CM

## 2018-10-26 DIAGNOSIS — F840 Autistic disorder: Secondary | ICD-10-CM

## 2018-10-26 DIAGNOSIS — F32A Depression, unspecified: Secondary | ICD-10-CM

## 2018-10-26 NOTE — Progress Notes (Signed)
Subjective:   Documentation for virtual audio and video telecommunications through [place] encounter:  The patient was located at home. 2 patient identifiers used.  The provider was located in the office. The patient did consent to this visit and is aware of possible charges through their insurance for this visit.  The other persons participating in this telemedicine service were her mother.     Patient ID: Jill Alexander, female    DOB: 08-25-1995, 23 y.o.   MRN: [id number]  HPI Chief Complaint  Patient presents with  . worried about eating    random headaches, nausea, stomach hurts when eating been going on a while. seen GI for this and all normal, but wants another provider opinion as she gets herself worked up.    She complains of intermittent abdominal pain that occurs sometimes right after eating and other times hours after eating. The pain is epigastric mainly. The pain is non radiating and described as an ache. She occasionally wakes up with "side pain". The pain has not been present in the past 2-3 days. States last week she had 2 days with pain. Cannot link her symptoms to any particular food or beverage type. She thinks she has an intolerance to food.  Occasional nausea but no vomiting.  Her bowel movements are unchanged.   Is currently taking omeprazole 40 mg bid for GERD.   She has seen Dr. Havery Moros for this and a gastric emptying study was ordered. She does not want to have this done. Had an EGD in December 2019 that was unremarkable.   Also complains of intermittent headaches that have been more frequent for the past few weeks. The headache is not always in the same location. No associated symptoms. No known triggers. Sometimes she sleeps longer than normal and then will have a headache. She takes Tylenol at times for headache. No vision disturbances. No aura. No photosensitivity.   Dizziness upon standing at times.  Her mother has checked orthostatic BP and  they have been normal.  Does not appear to be associated with headaches. She is on several psych medications that may be playing a role.   Denies fever, chills, chest pain, palpitations, shortness of breath, abdominal pain, N/V/D, urinary symptoms.   Past Medical History:  Diagnosis Date  . Anxiety   . Autism   . Depression   . GERD (gastroesophageal reflux disease)   . Heart murmur   . Seizures ([place])   . Tremor, 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   Past Surgical History:  Procedure Laterality Date  . NO PAST SURGERIES    . UPPER GI ENDOSCOPY     Current Outpatient Medications on File Prior to Visit  Medication Sig Dispense Refill  . acetaminophen (TYLENOL) 500 MG tablet Take 500 mg by mouth every 6 (six) hours as needed.    . Cariprazine HCl (VRAYLAR) 4.5 MG CAPS Take 1 capsule (4.5 mg total) by mouth daily. 30 capsule 1  . Ibuprofen (MOTRIN PO) Take 200 mg by mouth as needed.     . lamoTRIgine (LAMICTAL) 150 MG tablet Take 1 tablet (150 mg total) by mouth 2 (two) times daily. 60 tablet 1  . LORazepam (ATIVAN) 2 MG tablet Take 1 tablet (2 mg total) by mouth 2 (two) times daily. 60 tablet 1  . Multiple Vitamins-Minerals (MULTIVITAMIN PO) Take 1 tablet by mouth daily.    . Norethin Ace-Eth Estrad-FE (TAYTULLA) 1-20 MG-MCG(24) CAPS Take 1 tablet by mouth daily. 28 capsule 6  . omeprazole (PRILOSEC) 40  MG capsule TAKE 1 CAPSULE BY MOUTH TWICE A DAY 60 capsule 3  . ondansetron (ZOFRAN-ODT) 8 MG disintegrating tablet TAKE 1 TABLET (8 MG TOTAL) BY MOUTH 2 (TWO) TIMES DAILY. 60 tablet 3  . polyethylene glycol (MIRALAX) packet Take 17 g by mouth daily as needed. 14 each 0  . PRESCRIPTION MEDICATION emfam patch. Use everyday     No current facility-administered medications on file prior to visit.     Review of Systems Pertinent positives and negatives in the history of present illness.     Objective:   Physical Exam Wt 141 lb (64 kg)   BMI 25.79 kg/m   Alert and oriented and in no acute  distress.  Reports being asymptomatic today.  Normal speech, mood and thought process.      Assessment & Plan:  Epigastric pain - Plan: see plan below   Gastroesophageal reflux disease, esophagitis presence not specified   Intermittent headache   Dizziness on standing   Bipolar 2 disorder (HCC)   Anxiety and depression   Autism spectrum disorder   She is a pleasant 23 year old female who has several concerns regarding chronic health conditions.  Her mother is on speaker phone today.  She does have autism and other mental health conditions that are being treated by her psychiatrist. Information gathering was a slow process today.  Reviewed office notes and results from Dr. Adela LankArmbruster and other healthcare providers.  Epigastric pain is intermittent and does not appear to be improved with twice daily PPI.  No red flag symptoms.  Encouraged her to follow-up with Dr. Adela LankArmbruster and follow through with the gastric emptying study that he recommended.  In regards to her headaches, these are nonspecific and do not appear to be cluster or migraines.  Suspect tension headaches.  Encouraged her to keep a headache journal so that we can learn more regarding triggers and associated symptoms.  She agrees to do this.  We did discuss getting regular sleep, hydration and avoid skipping meals.  We also discussed avoiding fluctuations in caffeine intake.  Dizziness appears to be positional.  This is intermittent and not that bothersome.  Her mother works in healthcare and was able to check her orthostatic vitals and she does not appear to be postural.  Encouraged her to change positions slowly and to let me know if this becomes more bothersome.  Time spent on call was 45 minutes and in review of previous records 5 minutes total.  This virtual service is not related to other E/M service within previous 7 days.

## 2018-10-27 MED FILL — VRAYLAR 4.5 MG CAPSULE: 4.5 | 30 days supply | Qty: 30 | Fill #0

## 2018-10-28 ENCOUNTER — Encounter (HOSPITAL_COMMUNITY): Payer: Self-pay

## 2018-10-28 ENCOUNTER — Other Ambulatory Visit (HOSPITAL_COMMUNITY): Payer: Self-pay | Admitting: Family

## 2018-10-28 ENCOUNTER — Other Ambulatory Visit: Payer: Self-pay

## 2018-10-28 ENCOUNTER — Other Ambulatory Visit (HOSPITAL_COMMUNITY)
Payer: No Typology Code available for payment source | Attending: Psychiatry | Admitting: Licensed Clinical Social Worker

## 2018-10-28 DIAGNOSIS — F840 Autistic disorder: Secondary | ICD-10-CM | POA: Insufficient documentation

## 2018-10-28 DIAGNOSIS — F3181 Bipolar II disorder: Secondary | ICD-10-CM

## 2018-10-28 DIAGNOSIS — K219 Gastro-esophageal reflux disease without esophagitis: Secondary | ICD-10-CM | POA: Insufficient documentation

## 2018-10-28 DIAGNOSIS — F419 Anxiety disorder, unspecified: Secondary | ICD-10-CM | POA: Diagnosis not present

## 2018-10-28 DIAGNOSIS — Z79899 Other long term (current) drug therapy: Secondary | ICD-10-CM | POA: Diagnosis not present

## 2018-10-28 DIAGNOSIS — F0630 Mood disorder due to known physiological condition, unspecified: Secondary | ICD-10-CM | POA: Insufficient documentation

## 2018-10-28 DIAGNOSIS — R45851 Suicidal ideations: Secondary | ICD-10-CM | POA: Insufficient documentation

## 2018-10-28 NOTE — Progress Notes (Addendum)
Virtual Visit via Telephone Note  I connected withMadeline Sekai Alexander on 10/28/18 at  by telephoneand verified that I am speaking with the correct person using two identifiers.  I discussed the limitations, risks, security and privacy concerns of performing an evaluation and management service by telephone and the availability of in person appointments. I also discussed with the patient that there may be a patient responsible charge related to this service. The patient expressed understanding and agreed to proceed.  I discussed the assessment and treatment plan with the patient. The patient was provided an opportunity to ask questions and all were answered. The patient agreed with the plan and demonstrated an understanding of the instructions.  The patient was advised to call back or seek an in-person evaluation if the symptoms worsen or if the condition fails to improve as anticipated.  I provided 15 minutes of non-face-to-face time during this encounter.   Derrill Center, NP    Behavioral Health Partial Program Assessment Note  Date: 10/28/2018 Name: Jill Alexander MRN: [id number]    HPI:-Jill Alexander is a 23 y.o. Caucasian female presents with worsening depression and anxiety. She reported " I have been struggling with my mental health." Patient states she was enrolled with job traninig program  and is not attending  class at this time. Reports most of her stressors is related to "spending to much time" with her parents due to social isolation and COVID. Jill Alexander reports she has been mediaciton compliant. denies suicidal or homicidal ideations.  Denies auditory visual hallucinations.    Charted at previous admission 05/21/2018 Jill Alexander reports a history of self injures behaviors with cutting while in middle school.  Reports diagnoses of depression, anxiety, bipolar and states she is on the autistic spectrum.  Denies previous inpatient admissions.  Denies history of  physical or sexual abuse.  Reports a family history of mental illness: Father depression: Jill Alexander: Bipolar depression anxiety. Patient was enrolled in partial psychiatric program on 10/28/18.  Primary complaints include: anxiety, depression worse and difficulty with school.  Onset of symptoms was gradual with stable course since that time. Psychosocial Stressors include the following: family and school stressors.   I have reviewed the following documentation dated 07/01//2020: past psychiatric history and past medical history  Complaints of Pain: nonear Past Psychiatric History: Per MD assessment note on 1/23/2020Patient denies any history of prior psychiatric hospitalizations. She was treated with medications in the past-Lamictal, Abilify- flat and lethargic, Wellbutrin, Prozac, Ativan, Lithium. Prior to coming to [facility] outpatient patient was receiving treatment at [facility]. In [place] pt worked with one psychiatrist for over 10 yrs. Pt denies any hx of previous suicide attempts.   suicide attempts to cut her wrist in middle school.   Currently in treatment with Cariprazine 4.5 mg p.o. daily Lamictal 150 mg p.o. twice daily Ativan 2mg  po BID prn anxiety/mood swings D/cAbilify Start trial of Emsam patch 6mg  to skin daily  Substance Abuse History: none Use of Alcohol: denied Use of Caffeine: denies use Use of over the counter:       Past Surgical History:  Procedure Laterality Date  . NO PAST SURGERIES          Past Medical History:  Diagnosis Date  . Anxiety   . Autism   . Depression   . GERD (gastroesophageal reflux disease)   . Seizures ([place])   . Tremor, 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       Outpatient Encounter Medications as of 05/22/2018  Medication Sig  . acetaminophen (  TYLENOL) 500 MG tablet Take 500 mg by mouth every 6 (six) hours as needed.  . Cariprazine HCl 4.5 MG CAPS Take 1 capsule (4.5 mg total) by mouth daily.  . Ibuprofen (MOTRIN PO) Take 1 tablet by  mouth as needed.   . lamoTRIgine (LAMICTAL) 150 MG tablet Take 1 tablet (150 mg total) by mouth 2 (two) times daily.  Marland Kitchen. LORazepam (ATIVAN) 2 MG tablet Take 1 tablet (2 mg total) by mouth 2 (two) times daily.  . Multiple Vitamins-Minerals (MULTIVITAMIN PO) Take 1 tablet by mouth daily.  . Norethin Ace-Eth Estrad-FE (TAYTULLA) 1-20 MG-MCG(24) CAPS Take 1 tablet by mouth daily.  Marland Kitchen. omeprazole (PRILOSEC) 40 MG capsule Take 1 capsule (40 mg total) by mouth 2 (two) times daily.  . ondansetron (ZOFRAN ODT) 8 MG disintegrating tablet Take 1 tablet (8 mg total) by mouth every 8 (eight) hours as needed for nausea or vomiting.  . polyethylene glycol (MIRALAX) packet Take 17 g by mouth daily as needed.  . selegiline (EMSAM) 6 MG/24HR Place 1 patch (6 mg total) onto the skin daily.   No facility-administered encounter medications on file as of 05/22/2018.    No Known Allergies  Social History       Tobacco Use  . Smoking status: Never Smoker  . Smokeless tobacco: Never Used  Substance Use Topics  . Alcohol use: No   Functioning Relationships: good support system Education: College       Please specify degree: [employer] Other Pertinent History: None      Family History  Problem Relation Age of Onset  . Depression Father   . Anxiety disorder Jill   . Bipolar disorder Jill   . Diabetes Maternal Grandfather   . Cancer Maternal Grandfather   . Breast cancer Paternal Grandmother   . Uterine cancer Paternal Grandmother   . Irritable bowel syndrome Paternal Grandmother   . Colon cancer Neg Hx   . Esophageal cancer Neg Hx   . Stomach cancer Neg Hx   . Rectal cancer Neg Hx      Review of Systems Constitutional: negative  Objective:  There were no vitals filed for this visit.  Physical Exam:   Mental Status Exam: Appearance:    Well groomed Psychomotor::              Attention span and concentration:      Normal Behavior:         calm and  cooperative Speech:           normal volume Mood:  depressed and anxious Affect:  flat Thought Process:        Coherent Thought Content:        WDL Orientation:     person, place and time/date Cognition:        impaired Insight:            Intact Judgment:       Intact Estimate of Intelligence:         Average Fund of knowledge:    Aware of current events Memory:          Recent and remote intact Abnormal movements:            None Gait and station:          Normal  Assessment:  Diagnosis: 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 Mood disorder in conditions classified elsewhere   2. Bipolar 2 disorder [facility](HCC)     Indications for admission: inpatient care  required if not in partial hospital program  Plan: Orders placed for occupational therapy (OT) patient enrolled in Partial Hospitalization Program, patient's current medications are to be continued, the following medications are being prescribed by Psychiatrist Michae KavaAgarwal:   Meds: Cariprazine 4.5 mg p.o. daily Lamictal 150 mg p.o. twice daily Ativan 2mg  po BID prn anxiety/mood swings Continued  trial of Emsam patch 6mg  to skin daily    Treatment options and alternatives reviewed with patient and patient understands the above plan.  Treatment plan was reviewed and agreed upon by NP T. Melvyn NethLewis and patient Jill RibasMarilyn Alexander need for  group services    Oneta Rackanika N Dimitriy Carreras, NP

## 2018-10-28 NOTE — Progress Notes (Addendum)
Spiritual care group 10/28/2018 11:00-12:00  Facilitated by Chaplain Britini Garcilazo, MDiv    Group focused on topic of "self-care"  Patients engaged in facilitated discussion about topic.  Explored quotes related to self care and chose one which they agreed with and one which they disliked.  Engaged in discussion around quote choices and their experience / understanding of care for themselves.    

## 2018-10-28 NOTE — Progress Notes (Unsigned)
Virtual Visit via Telephone Note  I connected with Jill Alexander on 10/28/18 at  by telephone and verified that I am speaking with the correct person using two identifiers.   I discussed the limitations, risks, security and privacy concerns of performing an evaluation and management service by telephone and the availability of in person appointments. I also discussed with the patient that there may be a patient responsible charge related to this service. The patient expressed understanding and agreed to proceed.   I discussed the assessment and treatment plan with the patient. The patient was provided an opportunity to ask questions and all were answered. The patient agreed with the plan and demonstrated an understanding of the instructions.   The patient was advised to call back or seek an in-person evaluation if the symptoms worsen or if the condition fails to improve as anticipated.  I provided 15 minutes of non-face-to-face time during this encounter.   Derrill Center, NP    Behavioral Health Partial Program Assessment Note  Date: 05/22/2018 Name: Jill Alexander MRN: [id number]    HPI:-Jill Alexander is a 23 y.o. Caucasian female presents with worsening depression and anxiety. She reported " I have been struggling with my mental health." Patient states she was enrolled with job traninig program  and is not attending  class at this time. Reports most of her stressors is related to "spending to much time" with her parents due to social isolation and COVID. Angalina reports she has been mediaciton compliant. denies suicidal or homicidal ideations.  Denies auditory visual hallucinations.    Charted at previous admission 05/21/2018 [name] reports a history of self injures behaviors with cutting while in middle school.  Reports diagnoses of depression, anxiety, bipolar and states she is on the autistic spectrum.  Denies previous inpatient admissions.  Denies history of physical or  sexual abuse.  Reports a family history of mental illness: Father depression: Sister 33: Bipolar depression anxiety. Patient was enrolled in partial psychiatric program on 10/28/18.  Primary complaints include: anxiety, depression worse and difficulty with school.  Onset of symptoms was gradual with stable course since that time. Psychosocial Stressors include the following: family and school stressors.   I have reviewed the following documentation dated 05/22/2018: past psychiatric history and past medical history  Complaints of Pain: nonear Past Psychiatric History: Per MD assessment note on 1/23/2020Patient denies any history of prior psychiatric hospitalizations.  She was treated with medications in the past-Lamictal, Abilify- flat and lethargic, Wellbutrin, Prozac, Ativan, Lithium.  Prior to coming to [facility] outpatient patient was receiving treatment at [facility]. In [place] pt worked with one psychiatrist for over 10 yrs. Pt denies any hx of previous suicide attempts.   suicide attempts to cut her wrist in middle school.   Currently in treatment with Cariprazine 4.5 mg p.o. daily Lamictal 150 mg p.o. twice daily Ativan 2mg  po BID prn anxiety/mood swings D/c Abilify Start trial of Emsam patch 6mg  to skin daily  Substance Abuse History: none Use of Alcohol: denied Use of Caffeine: denies use Use of over the counter:  Past Surgical History:  Procedure Laterality Date  . NO PAST SURGERIES      Past Medical History:  Diagnosis Date  . Anxiety   . Autism   . Depression   . GERD (gastroesophageal reflux disease)   . Seizures ([place])   . 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 acetaminophen (TYLENOL) 500 MG tablet Take  500 mg by mouth every 6 (six) hours as needed.  . Cariprazine HCl 4.5 MG CAPS Take 1 capsule (4.5 mg total) by mouth daily.  . Ibuprofen (MOTRIN PO) Take 1 tablet by mouth as needed.   . lamoTRIgine (LAMICTAL)  150 MG tablet Take 1 tablet (150 mg total) by mouth 2 (two) times daily.  Marland Kitchen. LORazepam (ATIVAN) 2 MG tablet Take 1 tablet (2 mg total) by mouth 2 (two) times daily.  . Multiple Vitamins-Minerals (MULTIVITAMIN PO) Take 1 tablet by mouth daily.  . Norethin Ace-Eth Estrad-FE (TAYTULLA) 1-20 MG-MCG(24) CAPS Take 1 tablet by mouth daily.  Marland Kitchen. omeprazole (PRILOSEC) 40 MG capsule Take 1 capsule (40 mg total) by mouth 2 (two) times daily.  . ondansetron (ZOFRAN ODT) 8 MG disintegrating tablet Take 1 tablet (8 mg total) by mouth every 8 (eight) hours as needed for nausea or vomiting.  . polyethylene glycol (MIRALAX) packet Take 17 g by mouth daily as needed.  . selegiline (EMSAM) 6 MG/24HR Place 1 patch (6 mg total) onto the skin daily.   No facility-administered encounter medications on file as of 05/22/2018.    No Known Allergies  Social History   Tobacco Use  . Smoking status: Never Smoker  . Smokeless tobacco: Never Used  Substance Use Topics  . Alcohol use: No   Functioning Relationships: good support system Education: College       Please specify degree: [employer] Other Pertinent History: None Family History  Problem Relation Age of Onset  . Depression Father   . Anxiety disorder Sister   . Bipolar disorder Sister   . Diabetes Maternal Grandfather   . Cancer Maternal Grandfather   . Breast cancer Paternal Grandmother   . Uterine cancer Paternal Grandmother   . Irritable bowel syndrome Paternal Grandmother   . Colon cancer Neg Hx   . Esophageal cancer Neg Hx   . Stomach cancer Neg Hx   . Rectal cancer Neg Hx      Review of Systems Constitutional: negative  Objective:  There were no vitals filed for this visit.  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 Mood disorder in conditions classified elsewhere   2. Bipolar 2 disorder (HCC)     Indications for admission: inpatient care required if not in partial hospital program  Plan: Orders placed for occupational therapy (OT) patient enrolled in Partial Hospitalization Program, patient's current medications are to be continued, the following medications are being prescribed by Psychiatrist Michae KavaAgarwal:   Meds: Cariprazine 4.5 mg p.o. daily Lamictal 150 mg p.o. twice daily Ativan 2mg  po BID prn anxiety/mood swings Continued  trial of Emsam patch 6mg  to skin daily    Treatment options and alternatives reviewed with patient and patient understands the above plan.  Treatment plan was reviewed and agreed upon by NP T. Melvyn NethLewis and patient Izora RibasMarilyn Yarbro need for  group services    Oneta Rackanika N Lewis, NP

## 2018-10-29 ENCOUNTER — Other Ambulatory Visit (HOSPITAL_COMMUNITY): Payer: No Typology Code available for payment source | Admitting: Licensed Clinical Social Worker

## 2018-10-29 ENCOUNTER — Encounter (HOSPITAL_COMMUNITY): Payer: Self-pay

## 2018-10-29 ENCOUNTER — Other Ambulatory Visit: Payer: Self-pay

## 2018-10-29 ENCOUNTER — Other Ambulatory Visit (HOSPITAL_COMMUNITY): Payer: No Typology Code available for payment source | Admitting: Occupational Therapy

## 2018-10-29 DIAGNOSIS — R4589 Other symptoms and signs involving emotional state: Secondary | ICD-10-CM

## 2018-10-29 DIAGNOSIS — F3181 Bipolar II disorder: Secondary | ICD-10-CM

## 2018-10-29 DIAGNOSIS — F840 Autistic disorder: Secondary | ICD-10-CM

## 2018-10-29 NOTE — Progress Notes (Signed)
Spoke with patient via Webex video call, verified correct patient using 2 identifiers. Patient states this is her 2nd time in PHP. She has been depressed and anxious. Worries about things she has to do or wants to do but doesn't feel good enough to do them. She wants to get better in regards to her mental health. She started job training recently but doesn't look forward to it due to her depression. Has trouble sleeping, wakes before her alarm and then can't return to sleep. Denies SI/HI or psychosis. On scale of 1-10 she rates depression at 7 and anxiety at 9. She did discuss that at times she feels she may have a "meltdown" during group due to her autism and may need to walk away from group to get herself together before returning. Afraid someone will see her cry. Denies SI/HI or AV hallucinations. Did note that she gets dizzy sometimes and wondering if it could be a side effect from medications. This does not happen frequently but she will keep a record of when and let us know if it becomes a problem. Denies any other issues or complaints.

## 2018-10-29 NOTE — Therapy (Signed)
[facility] [address] [place], [place], [address] Phone: [phone]   Fax:  [phone]  Occupational Therapy Evaluation  Patient Details  Name: Jill Alexander MRN: [id number] Date of Birth: Sep 27, 1995 Referring Provider (OT): Hillery Jacksanika Lewis, NP  Virtual Visit via Video Note  I connected with Jill Alexander on 10/29/18 at  8:00 AM EDT by a video enabled telemedicine application and verified that I am speaking with the correct person using two identifiers.   I discussed the limitations of evaluation and management by telemedicine and the availability of in person appointments. The patient expressed understanding and agreed to proceed.   I discussed the assessment and treatment plan with the patient. The patient was provided an opportunity to ask questions and all were answered. The patient agreed with the plan and demonstrated an understanding of the instructions.   The patient was advised to call back or seek an in-person evaluation if the symptoms worsen or if the condition fails to improve as anticipated.  I provided 90 minutes of non-face-to-face time during this encounter.  Dalphine HandingKaylee Alben Jepsen, MSOT, OTR/L Behavioral Health OT/ Acute Relief OT PHP Office: [phone]  Dalphine HandingKaylee Taniesha Glanz, [place]    Encounter Date: 10/29/2018  OT End of Session - 10/29/18 1442    Visit Number  1    Number of Visits  12    Date for OT Re-Evaluation  11/26/18    Authorization Type  [facility] plan    OT Start Time  1100    OT Stop Time  1230    OT Time Calculation (min)  90 min    Equipment Utilized During Treatment  h/os    Activity Tolerance  Patient tolerated treatment well    Behavior During Therapy  [facility] for tasks assessed/performed       Past Medical History:  Diagnosis Date  . Anxiety   . Autism   . Depression   . GERD (gastroesophageal reflux disease)   . Heart murmur   . Seizures (HCC)   . Tremor, essential  04/09/2017    Past Surgical History:  Procedure Laterality Date  . NO PAST SURGERIES    . UPPER GI ENDOSCOPY      There were no vitals filed for this visit.  Subjective Assessment - 10/29/18 1440    Currently in Pain?  No/denies        [facility] - 10/29/18 0001      Assessment   Medical Diagnosis  Bipolar 2 disorder; Autism Spectrum disorder    Referring Provider (OT)  Hillery Jacksanika Lewis, NP    Onset Date/Surgical Date  10/29/18      Precautions   Precautions  None      Restrictions   Weight Bearing Restrictions  No      Balance Screen   Has the patient fallen in the past 6 months  No    Has the patient had a decrease in activity level because of a fear of falling?   No    Is the patient reluctant to leave their home because of a fear of falling?   No         OT assessment: OCAIRS  Diagnosis: Bipolar 2 disorder; Autism spectrum disorder  Past medical history/referral information: [facility] one in [facility]) for increased symptoms of depressive related symptoms. Pt is known to this Clinical research associatewriter for previous admissions. Pt is well known to this Clinical research associatewriter from  previous admission  Living situation: lives with parents  ADLs/IADLs: difficulty maintaining personal hygiene, decreased motivation  Sleep: pt reports decreased sleep this date, unable to quantify hours but shares she has been experiencing broken sleep.  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  Leisure: reading, Acupuncturistpodcasts  Social support: parents, mentions 2 friends that live in [place]  Struggles: social skills, sleep, goals, motivation   Financial traderCAIRS Mental Health Interview Summary of Client Scores:  FACILITATES PARTICIPATION IN OCCUPATION  ALLOWS PARTICIPATION IN OCCUPATION INHIBITS PARTICIPATION IN OCCUPATION RESTRICTS PARTICIPATION IN OCCUPATION COMMENTS  ROLES                X  maintaining job training 1-2 times per week but often states "what's the  point"  HABITS                X  routine mostly in place due to job training and PHP  PERSONAL CAUSATION                 X Cannot state  VALUES                X    INTERESTS                X  Very limited due to COVID 19  SKILLS                X  difficulties with concentration, memory, etc.  SHORT TERM GOALS                 X   LONG TERM GOALS                 X   INTERPETATION OF PAST EXPERIENCES                X    PHYSICAL ENVIRONMENT                X  Does not drive  SOCIAL ENVIRONMENT                X  Limited due to COVID  READINESS FOR CHANGE                X      Need for Occupational Therapy:  4 Shows positive occupational participation, no need for OT.   3 Need for minimal intervention/consultative participation     X 2 Need for OT intervention indicated to restore/improve participation   1 Need for extensive OT intervention indicated to improve participation.  Referral for follow up services also recommended.    Assessment:  Patient demonstrates behavior that inhibits participation in occupation.  Patient will benefit from occupational therapy intervention in order to improve time management, financial management, stress management, job readiness skills, social skills, and health management skills in preparation to return to full time community living and to be a productive community member.    Plan:  Patient will participate in skilled occupational therapy sessions individually or in a group setting to improve coping skills, psychosocial skills, and emotional skills required to return to prior level of function.  Treatment will be 3 times per week for 4 weeks.        OT TREATMENT  S: I am just trying not to get upset right now  O: Education given on self-accountability being in line with personal values and goals to maintain occupational balance in various community settings. Pt given goal identifying worksheet to list  immediate, short term, medium term, and long-term  goals using a SMART goal framework (specificity, meaningful, adaptive, realistic, and time bound). Goals created as guideline for pt to practice being accountable in various situations. Pt completed work sheet of goals and encouraged to share goals with the group, with emphasis on immediate goal for check in with pt for next session to maintain accountability.  A: Pt presents with flat affect, minimally engaged this date. She shares how she is having "a mood swing" and cannot participate in the activity. She shares how she has to focus on not getting upset. Pt does offer how medication management apps are helpful for her, but when asked about goals she is not willing to participate.  P: OT group will be x3 per week while pt in PHP.          OT Education - 10/29/18 1442    Education Details  education given on goal setting    Person(s) Educated  Patient    Methods  Explanation;Handout    Comprehension  Verbalized understanding       OT Short Term Goals - 10/29/18 1444      OT SHORT TERM GOAL #1   Title  Pt will be educated on strategies to improve psychosocial skills needed to participate fully in all daily, work, and leisure activities    Time  4    Period  Weeks    Status  New    Target Date  11/26/18      OT SHORT TERM GOAL #2   Title  Pt will apply psychosocial skills and coping mechanisms to daily activities in order to function independently and reintegrate into community dwelling    Time  4    Period  Weeks    Status  New    Target Date  11/26/18      OT SHORT TERM GOAL #3   Title  Pt will recall and/or apply 1-3 sleep hygiene strategies to improve BADL routine when reintegrating into community    Time  4    Period  Weeks    Status  New    Target Date  11/26/18      OT SHORT TERM GOAL #4   Title  Pt will engage in goal setting to improve functional BADL/IADL routine upon reintegrating into community    Time  4    Period  Weeks    Status  New    Target Date   11/26/18      OT SHORT TERM GOAL #5   Title  Pt will choose and/or engage in 1-3 socially engaging leisure activities to improve social participation upon re integrating into community    Time  4    Period  Weeks    Status  New    Target Date  11/26/18               Plan - 10/29/18 1443    OT Occupational Profile and History  Detailed Assessment- Review of Records and additional review of physical, cognitive, psychosocial history related to current functional performance    Occupational performance deficits (Please refer to evaluation for details):  ADL's;IADL's;Rest and Sleep;Work;Social Participation;Leisure    Body Structure / Function / Physical Skills  ADL    Cognitive Skills  Attention;Emotional;Understand    Psychosocial Skills  Coping Strategies;Habits;Routines and Behaviors;Environmental  Adaptations;Interpersonal Interaction    Rehab Potential  Good    Clinical Decision Making  Several treatment options, min-mod task modification necessary  Comorbidities Affecting Occupational Performance:  Presence of comorbidities impacting occupational performance    Comorbidities impacting occupational performance description:  autism spectrum disorder    Modification or Assistance to Complete Evaluation   Max significant modification of tasks or assist is necessary to complete    OT Frequency  3x / week    OT Duration  4 weeks    OT Treatment/Interventions  Coping strategies training;Psychosocial skills training;Self-care/ADL training;Other (comment)   community integration   Consulted and Agree with Plan of Care  Patient       Patient will benefit from skilled therapeutic intervention in order to improve the following deficits and impairments:   Body Structure / Function / Physical Skills: ADL Cognitive Skills: Attention, Emotional, Understand Psychosocial Skills: Coping Strategies, Habits, Routines and Behaviors, Environmental  Adaptations, Interpersonal  Interaction   Visit Diagnosis: 1. Bipolar 2 disorder (HCC)   2. Autism spectrum disorder   3. Difficulty coping       Problem List Patient Active Problem List   Diagnosis Date Noted  . Intermittent headache 10/26/2018  . Epigastric pain 10/26/2018  . Gastroesophageal reflux disease 10/26/2018  . Bipolar 2 disorder (HCC) 06/02/2018  . Panic attacks 06/02/2018  . Autism spectrum disorder 05/06/2018  . Anxiety and depression 05/06/2018  . 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 Seizures (HCC) 04/09/2017  . Tremor, 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   Dalphine HandingKaylee Kendall Arnell, MSOT, OTR/L Behavioral Health OT/ Acute Relief OT PHP Office: [phone]  Dalphine HandingKaylee Ricka Westra 10/29/2018, 2:47 PM  [facility] [address] [place], [place], [address] Phone: [phone]   Fax:  [phone]  Name: Jill Alexander MRN: [id number] Date of Birth: 1996-02-09

## 2018-10-31 ENCOUNTER — Ambulatory Visit (INDEPENDENT_AMBULATORY_CARE_PROVIDER_SITE_OTHER): Payer: No Typology Code available for payment source | Admitting: Clinical

## 2018-10-31 DIAGNOSIS — F840 Autistic disorder: Secondary | ICD-10-CM | POA: Diagnosis not present

## 2018-11-01 ENCOUNTER — Ambulatory Visit (INDEPENDENT_AMBULATORY_CARE_PROVIDER_SITE_OTHER): Payer: No Typology Code available for payment source | Admitting: Clinical

## 2018-11-01 DIAGNOSIS — F840 Autistic disorder: Secondary | ICD-10-CM | POA: Diagnosis not present

## 2018-11-02 ENCOUNTER — Other Ambulatory Visit (HOSPITAL_COMMUNITY): Payer: No Typology Code available for payment source | Admitting: Specialist

## 2018-11-02 ENCOUNTER — Encounter (HOSPITAL_COMMUNITY): Payer: Self-pay

## 2018-11-02 ENCOUNTER — Other Ambulatory Visit: Payer: Self-pay

## 2018-11-02 ENCOUNTER — Other Ambulatory Visit (HOSPITAL_COMMUNITY): Payer: No Typology Code available for payment source | Admitting: Licensed Clinical Social Worker

## 2018-11-02 DIAGNOSIS — F0630 Mood disorder due to known physiological condition, unspecified: Secondary | ICD-10-CM

## 2018-11-02 DIAGNOSIS — F3181 Bipolar II disorder: Secondary | ICD-10-CM | POA: Diagnosis not present

## 2018-11-02 MED ORDER — VRAYLAR 6 MG PO CAPS: 6.0000 mg | ORAL_CAPSULE | Freq: Every day | ORAL | 0 refills | Status: DC

## 2018-11-02 NOTE — Progress Notes (Signed)
Virtual Visit via Video Note  I connected with Jill Alexander on 11/02/18 at  9:00 AM EDT by a video enabled telemedicine application and verified that I am speaking with the correct person using two identifiers.   I discussed the limitations of evaluation and management by telemedicine and the availability of in person appointments. The patient expressed understanding and agreed to proceed.  I discussed the assessment and treatment plan with the patient. The patient was provided an opportunity to ask questions and all were answered. The patient agreed with the plan and demonstrated an understanding of the instructions.   The patient was advised to call back or seek an in-person evaluation if the symptoms worsen or if the condition fails to improve as anticipated.  I provided 00 minutes of non-face-to-face time during this encounter.   [facility], Jill Alexander   BH MD/PA/Jill Alexander OP Progress Note  11/02/2018 9:39 AM Jill Alexander  MRN:  [id number]   Evaluation:  Jill Alexander was seen via WebEx.  She presents flat, guarded and depressed.  Patient reported passive suicidal ideations, without plan or intent.  Reports thoughts to "cut myself" I she did in the past. However patient is admittly denying.  Patient reports she was recently titrated off the Emsam patch about 2 days prior which is causing worsening mood lability, anger outbursts, agitation and depression.  Patient has requested to be restarted on Abilify.  Patient reported that Abilify did not help her in the past however she is seeking any medications to help her with her mood at this time.  Chart review patient currently followed by psychiatrist Jill Alexander.  With current recommendations to follow-up with Ketamine outpatient and/or ECT treatment.  Case staffed with MD Jill Alexander.  Will increase Vraylar 4.5 mg to 6 mg p.o. daily.  Will initiate referral for psychiatrist Jill Alexander.  15:00-Spoke to  patient regarding current treatment plan.  She provided verbal authorization to follow-up with her mother Jill Alexander who reported concerns of worsening mood irritability as reported by patient.  Education provided with following up at the emergency department if unable to keep patient safe. Mother declined to patient having "Autism, that will make her worse." Advised mother and patient of current titration with increasing Vraylar, and both appear to be receptive to plan.  Patient to continue Partial hospitalization programming.  Support, encouragement reassurance was provided.  Receptive to plan  Visit Diagnosis: No diagnosis found.  Past Psychiatric History:   Past Medical History:  Past Medical History:  Diagnosis Date  . Anxiety   . Autism   . Depression   . GERD (gastroesophageal reflux disease)   . Heart murmur   . Seizures ([place])   . 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 NO PAST SURGERIES    . UPPER GI ENDOSCOPY      Family Psychiatric History:   Family History:  Family History  Problem Relation Age of Onset  . Depression Father   . Anxiety disorder Sister   . Bipolar disorder Sister   . Diabetes Maternal Grandfather   . Cancer Maternal Grandfather   . Breast cancer Paternal Grandmother   . Uterine cancer Paternal Grandmother   . Irritable bowel syndrome Paternal Grandmother   . Colon cancer Neg Hx   . Esophageal cancer Neg Hx   . Stomach cancer Neg Hx   . Rectal cancer Neg Hx     Social History:  Social History   Socioeconomic  History  . Marital status: Single    Spouse name: Not on file  . Number of children: 0  . Years of education: Not on file  . Highest education level: Some college, no degree  Occupational History  . Not on file  Social Needs  . Financial resource strain: Not hard at all  . Food insecurity    Worry: Never true    Inability: Never true  . Transportation needs    Medical: Yes     Non-medical: Yes  Tobacco Use  . Smoking status: Never Smoker  . Smokeless tobacco: Never Used  Substance and Sexual Activity  . Alcohol use: No  . Drug use: No  . Sexual activity: Not Currently  Lifestyle  . Physical activity    Days per week: 3 days    Minutes per session: 30 min  . Stress: Rather much  Relationships  . Social Musicianconnections    Talks on phone: Never    Gets together: More than three times a week    Attends religious service: 1 to 4 times per year    Active member of club or organization: No    Attends meetings of clubs or organizations: Never    Relationship status: Never married  Other Topics Concern  . Not on file  Social History Narrative   [place]. Pt moved here from [place] in 2018.   Caffeine use: 2-3 cups per day   Left handed     Allergies:  Allergies  Allergen Reactions  . Gluten Meal     Metabolic Disorder Labs: No results found for: HGBA1C, MPG No results found for: PROLACTIN No results found for: CHOL, TRIG, HDL, CHOLHDL, VLDL, LDLCALC Lab Results  Component Value Date   TSH 1.340 11/17/2017   TSH 0.677 08/21/2017    Therapeutic Level Labs: Lab Results  Component Value Date   LITHIUM 0.8 01/05/2018   LITHIUM 0.5 (L) 12/18/2017   No results found for: VALPROATE No components found for:  CBMZ  Current Medications: Current Outpatient Medications  Medication Sig Dispense Refill  . acetaminophen (TYLENOL) 500 MG tablet Take 500 mg by mouth every 6 (six) hours as needed.    . Cariprazine HCl (VRAYLAR) 4.5 MG CAPS Take 1 capsule (4.5 mg total) by mouth daily. 30 capsule 1  . Ibuprofen (MOTRIN PO) Take 200 mg by mouth as needed.     . lamoTRIgine (LAMICTAL) 150 MG tablet Take 1 tablet (150 mg total) by mouth 2 (two) times daily. 60 tablet 1  . LORazepam (ATIVAN) 2 MG tablet Take 1 tablet (2 mg total) by mouth 2 (two) times daily. 60 tablet 1  . Multiple Vitamins-Minerals (MULTIVITAMIN PO) Take 1 tablet by  mouth daily.    . Norethin Ace-Eth Estrad-FE (TAYTULLA) 1-20 MG-MCG(24) CAPS Take 1 tablet by mouth daily. 28 capsule 6  . omeprazole (PRILOSEC) 40 MG capsule TAKE 1 CAPSULE BY MOUTH TWICE A DAY 60 capsule 3  . ondansetron (ZOFRAN-ODT) 8 MG disintegrating tablet TAKE 1 TABLET (8 MG TOTAL) BY MOUTH 2 (TWO) TIMES DAILY. 60 tablet 3  . polyethylene glycol (MIRALAX) packet Take 17 g by mouth daily as needed. 14 each 0  . PRESCRIPTION MEDICATION emfam patch. Use everyday     No current facility-administered medications for this visit.      Musculoskeletal: Strength & Muscle Tone: within normal limits Gait & Station: normal Patient leans: N/A  Psychiatric Specialty Exam: ROS  There were no vitals taken for this  visit.There is no height or weight on file to calculate BMI.  General Appearance: Casual  Eye Contact:  Good  Speech:  Clear and Coherent  Volume:  Decreased  Mood:  Depressed  Affect:  Depressed and Flat  Thought Process:  Coherent  Orientation:  Full (Time, Place, and Person)  Thought Content: Logical   Suicidal Thoughts:  Yes.  without intent/plan  Homicidal Thoughts:  No  Memory:  Immediate;   Fair Recent;   Fair  Judgement:  Fair  Insight:  Lacking  Psychomotor Activity:  Normal  Concentration:  Concentration: Fair  Recall:  FiservFair  Fund of Knowledge: Fair  Language: Fair  Akathisia:  No  Handed:  Right  AIMS (if indicated):   Assets:  Communication Skills Desire for Improvement Social Support  ADL's:  Intact  Cognition: WNL  Sleep:  Fair   Screenings: ECT-MADRS     [facility] ECT Treatment from 07/13/2018 [facility] ECT Treatment from 07/06/2018 [facility]  MADRS Total Score  24  20  37    GAD-7     Counselor from 06/01/2018 in [facility]AL HEALTH PARTIAL HOSPITALIZATION PROGRAM Counselor from 05/22/2018 in BEHAVIORAL HEALTH  PARTIAL HOSPITALIZATION PROGRAM [facility]  Total GAD-7 Score  18  19  15  14  18     Mini-Mental     ECT [facility]  Total Score (max 30 points )  30  30  30     PHQ2-9     Counselor from 10/29/2018 in BEHAVIORAL HEALTH PARTIAL HOSPITALIZATION PROGRAM Counselor from 06/01/2018 in BEHAVIORAL HEALTH PARTIAL HOSPITALIZATION PROGRAM Counselor from 05/22/2018 in [facility]PITALIZATION PROGRAM [facility]  PHQ-2 Total Score  6  5  5  5  5   PHQ-9 Total Score  21  21  17  20  17        Assessment and Plan:  Continue with Partial Hospitalization   Increased Vraylar 4.5mg  to  6mg  caps po daily  Treatment team to follow-up with patient's referral to MD Evelene CroonKaur for Ketamine as requesting by patient's attending psychiatrist -Staff message for attending awaiting reccomendations 11/02/2018   Treatment plan was reviewed and agreed upon by Jill Alexander. Chilton Greathouse. Nicolae Vasek and patient Franklyn LorMadeline Alexander need for continued group services.    Oneta Rackanika N Tailyn Hantz, Jill Alexander 11/02/2018, 9:39 AM

## 2018-11-02 NOTE — Therapy (Signed)
Virtual Visit via Video Note  I connected with Jill Alexander on 11/02/18 at  11:00 AM EDT by a video enabled telemedicine application and verified that I am speaking with the correct person using two identifiers.   I discussed the limitations of evaluation and management by telemedicine and the availability of in person appointments. The patient expressed understanding and agreed to proceed.  I discussed the assessment and treatment plan with the patient. The patient was provided an opportunity to ask questions and all were answered. The patient agreed with the plan and demonstrated an understanding of the instructions.   The patient was advised to call back or seek an in-person evaluation if the symptoms worsen or if the condition fails to improve as anticipated.  I provided 60 minutes of non-face-to-face time during this encounter.     Jill Alexander, [place], [address] Phone: [phone]   Fax:  [phone]  Occupational Therapy Treatment  Patient Details  Name: Jill Alexander MRN: [id number] Date of Birth: Mar 07, 1996 Referring Provider (OT): Ricky Ala, NP   Encounter Date: 11/02/2018  OT End of Session - 11/02/18 1458    Visit Number  2    Number of Visits  12    Date for OT Re-Evaluation  11/26/18    Authorization Type  [facility] plan    OT Start Time  1100    OT Stop Time  1200    OT Time Calculation (min)  60 min    Equipment Utilized During Treatment  h/os    Activity Tolerance  Patient tolerated treatment well    Behavior During Therapy  [facility] for tasks assessed/performed       Past Medical History:  Diagnosis Date  . Anxiety   . Autism   . Depression   . GERD (gastroesophageal reflux disease)   . Heart murmur   . Seizures ([place])   . 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 NO PAST SURGERIES    . UPPER GI ENDOSCOPY       There were no vitals filed for this visit.  Subjective Assessment - 11/02/18 1458    Currently in Pain?  No/denies           S:  You should be assertive to stand up for yourself.  O:  Patient participated in assertiveness training group this date.  Patient was educated on definition of passive, assertive, passive-aggressive and aggressive communication.  Benefits and drawbacks of each communication style, nonverbal communication/body language associated with each communication style.  Additionally, group members were.  Group explored individual reasons that assertive communication was not used, and the cost of this.  Members reviewed several situations (saying no, criticizing others, sharing compliments, sharing frustrations) and their comfort level of being assertive in these conversations.  Members given 4 scenarios to review and are to write down an assertive response. A:  [name] was engaged throughout group.  [name] acknowledges that she does not complete necessary emails/phone calls that she needs to do for herself and her wellbeing because she is afraid to inconvenience others.  [name] states she will follow through with nagging from her parents, not on her own volition.   P:  Group will be educated on techniques to think assertively and behave assertively.                      OT Education -  11/02/18 1458    Education Details  education provided on assertive communication skills and techniques to assertively say no.    Person(s) Educated  Patient    Methods  Explanation;Demonstration;Handout    Comprehension  Verbalized understanding       OT Short Term Goals - 11/02/18 1500      OT SHORT TERM GOAL #1   Title  Pt will be educated on strategies to improve psychosocial skills needed to participate fully in all daily, work, and leisure activities    Time  4    Period  Weeks    Status  On-going    Target Date  11/26/18      OT SHORT TERM GOAL #2   Title  Pt  will apply psychosocial skills and coping mechanisms to daily activities in order to function independently and reintegrate into community dwelling    Time  4    Period  Weeks    Status  On-going    Target Date  11/26/18      OT SHORT TERM GOAL #3   Title  Pt will recall and/or apply 1-3 sleep hygiene strategies to improve BADL routine when reintegrating into community    Time  4    Period  Weeks    Status  On-going    Target Date  11/26/18      OT SHORT TERM GOAL #4   Title  Pt will engage in goal setting to improve functional BADL/IADL routine upon reintegrating into community    Time  4    Period  Weeks    Status  On-going    Target Date  11/26/18      OT SHORT TERM GOAL #5   Title  Pt will choose and/or engage in 1-3 socially engaging leisure activities to improve social participation upon re integrating into community    Time  4    Period  Weeks    Status  On-going    Target Date  11/26/18               Plan - 11/02/18 1459    Body Structure / Function / Physical Skills  ADL    Cognitive Skills  Attention;Emotional;Understand    Psychosocial Skills  Coping Strategies;Habits;Routines and Behaviors;Environmental  Adaptations;Interpersonal Interaction    Comorbidities impacting occupational performance description:  autism spectrum disorder       Patient will benefit from skilled therapeutic intervention in order to improve the following deficits and impairments:   Body Structure / Function / Physical Skills: ADL Cognitive Skills: Attention, Emotional, Understand Psychosocial Skills: Coping Strategies, Habits, Routines and Behaviors, Environmental  Adaptations, Interpersonal Interaction   Visit Diagnosis: 1. Mood disorder in conditions classified elsewhere       Problem List Patient Active Problem List   Diagnosis Date Noted  . Intermittent headache 10/26/2018  . Epigastric pain 10/26/2018  . Gastroesophageal reflux disease 10/26/2018  . Bipolar 2  disorder ([facility]CC) 06/02/2018  . Panic attacks 06/02/2018  . Autism spectrum disorder 05/06/2018  . Anxiety and depression 05/06/2018  . 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 Seizures ([facility]) 04/09/2017  . Tremor, 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    Jill Alexander, MHA, OTR/L [phone]  11/02/2018, 3:01 PM  [facility] [address] [place], [place], [address] Phone: [phone]   Fax:  [phone]  Name: Vangie BickerMadeline Sarah Arizola MRN: [id number] Date of Birth: 1995/11/20

## 2018-11-03 ENCOUNTER — Other Ambulatory Visit: Payer: Self-pay

## 2018-11-03 ENCOUNTER — Encounter (HOSPITAL_COMMUNITY): Payer: Self-pay | Admitting: Family

## 2018-11-03 ENCOUNTER — Ambulatory Visit (HOSPITAL_COMMUNITY): Payer: No Typology Code available for payment source

## 2018-11-03 ENCOUNTER — Other Ambulatory Visit (HOSPITAL_COMMUNITY): Payer: No Typology Code available for payment source | Admitting: Licensed Clinical Social Worker

## 2018-11-03 DIAGNOSIS — F3181 Bipolar II disorder: Secondary | ICD-10-CM

## 2018-11-03 DIAGNOSIS — F840 Autistic disorder: Secondary | ICD-10-CM

## 2018-11-03 NOTE — Progress Notes (Signed)
Spoke with patient via Webex video call, verified correct patient using 2 identifiers. Flat affect, depressed mood, does smile when spoken to. Patient states that she has been more depressed and angry this past week. Was tapered off her emfam patch and her mood became worse. Her Vraylar was increased yesterday to 6mg  daily. Also will possibly start Ketamine clinics, she is looking into this. Groups are emotionally draining for her and she has trouble staring at a computer screen for 4 hours. Denies SI/HI or AV hallucinations. Was going to E[facility] at [facility] but has stopped this due to having problems with her short term memory after each treatment. On scale of 1-10 as 10 being worst, she rates depression at 8 and anxiety at 8. Discussed possibility of taking walks to her favorite coffee shop near her house to help with depression. No side effects from medication.

## 2018-11-04 ENCOUNTER — Other Ambulatory Visit (HOSPITAL_COMMUNITY): Payer: No Typology Code available for payment source | Admitting: Licensed Clinical Social Worker

## 2018-11-04 ENCOUNTER — Other Ambulatory Visit: Payer: Self-pay

## 2018-11-04 DIAGNOSIS — F3181 Bipolar II disorder: Secondary | ICD-10-CM | POA: Diagnosis not present

## 2018-11-04 DIAGNOSIS — F840 Autistic disorder: Secondary | ICD-10-CM

## 2018-11-04 NOTE — Progress Notes (Addendum)
GROUP NOTE - spiritual care group 11/04/2018 11:00 - 12:00 ?Facilitated by Simone Curia, MDiv, BCC.  ? ? Group focused on topic of strength. ?Group members reflected on what thoughts and feelings emerge when they hear this topic. ?They then engaged in facilitated dialog around how strength is present in their lives. This dialog focused on representing what strength had been to them in their lives (images and patterns given) and what they saw as helpful in their life now (what they needed / wanted). ? ? Activity drew on narrative framework   Jill Alexander was present throughout group.  Noted she hopes for strength in balancing dependence and independence.  Described this as reaching out for help when appropriate (not imagning she can do everything herself) but not being dependent on others.

## 2018-11-05 ENCOUNTER — Other Ambulatory Visit (HOSPITAL_COMMUNITY): Payer: No Typology Code available for payment source | Admitting: Licensed Clinical Social Worker

## 2018-11-05 ENCOUNTER — Encounter (HOSPITAL_COMMUNITY): Payer: Self-pay

## 2018-11-05 ENCOUNTER — Other Ambulatory Visit: Payer: Self-pay

## 2018-11-05 ENCOUNTER — Other Ambulatory Visit (HOSPITAL_COMMUNITY): Payer: No Typology Code available for payment source | Admitting: Specialist

## 2018-11-05 DIAGNOSIS — F3181 Bipolar II disorder: Secondary | ICD-10-CM

## 2018-11-05 DIAGNOSIS — R4589 Other symptoms and signs involving emotional state: Secondary | ICD-10-CM

## 2018-11-05 DIAGNOSIS — F840 Autistic disorder: Secondary | ICD-10-CM

## 2018-11-05 DIAGNOSIS — F0630 Mood disorder due to known physiological condition, unspecified: Secondary | ICD-10-CM

## 2018-11-05 NOTE — Therapy (Signed)
[facility] [address] [place], [place], [address] Phone: [phone]   Fax:  [phone] Virtual Visit via Video Note  I connected with Jill Alexander on 11/05/18 at  11:00 AM EDT by a video enabled telemedicine application and verified that I am speaking with the correct person using two identifiers.   I discussed the limitations of evaluation and management by telemedicine and the availability of in person appointments. The patient expressed understanding and agreed to proceed.   I discussed the assessment and treatment plan with the patient. The patient was provided an opportunity to ask questions and all were answered. The patient agreed with the plan and demonstrated an understanding of the instructions.   The patient was advised to call back or seek an in-person evaluation if the symptoms worsen or if the condition fails to improve as anticipated.  I provided 60 minutes of non-face-to-face time during this encounter.      Occupational Therapy Treatment  Patient Details  Name: Jill Alexander MRN: [id number] Date of Birth: 1995-09-05 Referring Provider (OT): Hillery Jacksanika Lewis, NP   Encounter Date: 11/05/2018  OT End of Session - 11/05/18 1530    Visit Number  3    Number of Visits  12    Date for OT Re-Evaluation  11/26/18    Authorization Type  [facility] Focus plan    OT Start Time  1100    OT Stop Time  1200    OT Time Calculation (min)  60 min    Activity Tolerance  Patient tolerated treatment well    Behavior During Therapy  [facility] for tasks assessed/performed       Past Medical History:  Diagnosis Date  . Anxiety   . Autism   . Depression   . GERD (gastroesophageal reflux disease)   . Heart murmur   . Seizures ([facility])   . 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 NO PAST SURGERIES    . UPPER GI ENDOSCOPY      There were no vitals filed for this  visit.  Subjective Assessment - 11/05/18 1529    Currently in Pain?  No/denies             S:  I think assertiveness is standing up for your self.  I need to take some time for myself out of the group. O:  Patient was engaged in group throughout session. Group focused on assertive communication skills, including verbal and nonverbal traits of assertive communication, the purpose of assertive communication, and reasons we often avoid assertive communication.  Group was educated on strategies to improve assertive thinking including though diaries and behavioral experiments.  Members had an opportunity to provide solutions to each step of these strategies.  Group was educated on behaving assertively including basic assertion, empathetic assertion, consequence assertion, negative assertion, and broken record.  Members were able to explore situations in which each type of assertion was most beneficial. A:  Jill Alexander was engaged  in session.  Jill Alexander asserted in group that she needed a break, when acknowledged that this was assertive in nature, she then passively apologized for need for break.  She was able to participate in scenarios if prompted to do so.   P:  Continue OT group 3 times per week, next group session to explore job finding resources.                OT Education -  11/05/18 1529    Education Details  educated on assertive thinking and assertive behavior strategies    Person(s) Educated  Patient    Methods  Explanation;Demonstration;Handout    Comprehension  Verbalized understanding       OT Short Term Goals - 11/05/18 1531      OT SHORT TERM GOAL #1   Title  Pt will be educated on strategies to improve psychosocial skills needed to participate fully in all daily, work, and leisure activities    Time  4    Period  Weeks    Status  On-going    Target Date  11/26/18      OT SHORT TERM GOAL #2   Title  Pt will apply psychosocial skills and coping mechanisms to daily  activities in order to function independently and reintegrate into community dwelling    Time  4    Period  Weeks    Status  On-going    Target Date  11/26/18      OT SHORT TERM GOAL #3   Title  Pt will recall and/or apply 1-3 sleep hygiene strategies to improve BADL routine when reintegrating into community    Time  4    Period  Weeks    Status  On-going    Target Date  11/26/18      OT SHORT TERM GOAL #4   Title  Pt will engage in goal setting to improve functional BADL/IADL routine upon reintegrating into community    Time  4    Period  Weeks    Status  On-going    Target Date  11/26/18      OT SHORT TERM GOAL #5   Title  Pt will choose and/or engage in 1-3 socially engaging leisure activities to improve social participation upon re integrating into community    Time  4    Period  Weeks    Status  On-going    Target Date  11/26/18               Plan - 11/05/18 1531    Body Structure / Function / Physical Skills  ADL    Cognitive Skills  Attention;Emotional;Understand    Psychosocial Skills  Coping Strategies;Habits;Routines and Behaviors;Environmental  Adaptations;Interpersonal Interaction       Patient will benefit from skilled therapeutic intervention in order to improve the following deficits and impairments:   Body Structure / Function / Physical Skills: ADL Cognitive Skills: Attention, Emotional, Understand Psychosocial Skills: Coping Strategies, Habits, Routines and Behaviors, Environmental  Adaptations, Interpersonal Interaction   Visit Diagnosis: 1. Mood disorder in conditions classified elsewhere   2. Difficulty coping   3. Autism spectrum disorder       Problem List Patient Active Problem List   Diagnosis Date Noted  . Intermittent headache 10/26/2018  . Epigastric pain 10/26/2018  . Gastroesophageal reflux disease 10/26/2018  . Bipolar 2 disorder ([place]) 06/02/2018  . Panic attacks 06/02/2018  . Autism spectrum disorder 05/06/2018  .  Anxiety and depression 05/06/2018  . 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 Seizures ([place]) 04/09/2017  . Tremor, 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    Jill Alexander, [place], OTR/L [phone]  11/05/2018, 3:31 PM  [place], [place], [address] Phone: [phone]   Fax:  [phone]  Name: Joellyn Grandt MRN: [id number] Date of Birth: 26-Jul-1995

## 2018-11-06 ENCOUNTER — Telehealth (HOSPITAL_COMMUNITY): Payer: Self-pay | Admitting: Psychiatry

## 2018-11-06 ENCOUNTER — Encounter (HOSPITAL_COMMUNITY): Payer: Self-pay

## 2018-11-06 ENCOUNTER — Other Ambulatory Visit: Payer: Self-pay

## 2018-11-06 ENCOUNTER — Other Ambulatory Visit (HOSPITAL_COMMUNITY): Payer: No Typology Code available for payment source | Admitting: Licensed Clinical Social Worker

## 2018-11-06 ENCOUNTER — Other Ambulatory Visit (HOSPITAL_COMMUNITY): Payer: No Typology Code available for payment source | Admitting: Specialist

## 2018-11-06 DIAGNOSIS — R4589 Other symptoms and signs involving emotional state: Secondary | ICD-10-CM

## 2018-11-06 DIAGNOSIS — F0630 Mood disorder due to known physiological condition, unspecified: Secondary | ICD-10-CM

## 2018-11-06 DIAGNOSIS — F3181 Bipolar II disorder: Secondary | ICD-10-CM

## 2018-11-06 DIAGNOSIS — F840 Autistic disorder: Secondary | ICD-10-CM

## 2018-11-06 NOTE — Therapy (Signed)
Virtual Visit via Video Note  I connected with Jill Alexander on 11/06/18 at 930 by a video enabled telemedicine application and verified that I am speaking with the correct person using two identifiers.   I discussed the limitations of evaluation and management by telemedicine and the availability of in person appointments. The patient expressed understanding and agreed to proceed.     I discussed the assessment and treatment plan with the patient. The patient was provided an opportunity to ask questions and all were answered. The patient agreed with the plan and demonstrated an understanding of the instructions.   The patient was advised to call back or seek an in-person evaluation if the symptoms worsen or if the condition fails to improve as anticipated.  I provided 60 minutes of non-face-to-face time during this encounter.     [facility] [address] [place], [place], [address] Phone: [phone]   Fax:  [phone]  Occupational Therapy Treatment  Patient Details  Name: Jill Alexander MRN: [id number] Date of Birth: 1995-07-06 Referring Provider (OT): Hillery Jacksanika Lewis, NP   Encounter Date: 11/06/2018  OT End of Session - 11/06/18 1204    Visit Number  4    Number of Visits  12    Date for OT Re-Evaluation  11/26/18    Authorization Type  [facility] plan    OT Start Time  0930    OT Stop Time  1030    OT Time Calculation (min)  60 min    Activity Tolerance  Patient tolerated treatment well    Behavior During Therapy  [facility] for tasks assessed/performed       Past Medical History:  Diagnosis Date  . Anxiety   . Autism   . Depression   . GERD (gastroesophageal reflux disease)   . Heart murmur   . Seizures (HCC)   . 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 NO PAST SURGERIES    . UPPER GI ENDOSCOPY      There were no vitals filed for this  visit.  Subjective Assessment - 11/06/18 1204    Currently in Pain?  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   O:  Group began with members brainstorming on activities they enjoyed doing by completing a career interest survey, and how these could be incorporated into a work setting.  If you enjoy your work, you will be less stressed at work.  Jill Alexander identified that she enjoys books and literature.  Jill Alexander was given information on careers that would compliment this area of interest.  Group was educated and discussed common sources of work stress, and how to manage or minimize these stressors.   Group discussed soft skills and their importance to success in the workplace, as well as interview tips.  Group also identified their dream job/career and the barriers that they currently have to achieving their goal.  Members were encouraged to investigate and discuss possible solutions to their barriers.  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     P:  Continue skilled OT group 3times per week for 3 weeks.  OT Education - 11/06/18 1204    Education Details  Educated patient on soft skills and their importance in the workplace, interview strategies, common work stressors and ways to manage stress at work.    Person(s) Educated  Patient    Methods  Explanation;Handout    Comprehension  Verbalized understanding       OT Short Term Goals - 11/06/18 1204      OT SHORT TERM GOAL #1   Title  Pt will be educated on strategies to improve psychosocial skills needed to participate fully in all daily, work, and leisure activities    Time  4    Period  Weeks    Status  On-going    Target Date  11/26/18      OT SHORT TERM GOAL #2   Title  Pt will apply psychosocial skills and coping mechanisms to daily  activities in order to function independently and reintegrate into community dwelling    Time  4    Period  Weeks    Status  On-going    Target Date  11/26/18      OT SHORT TERM GOAL #3   Title  Pt will recall and/or apply 1-3 sleep hygiene strategies to improve BADL routine when reintegrating into community    Time  4    Period  Weeks    Status  On-going    Target Date  11/26/18      OT SHORT TERM GOAL #4   Title  Pt will engage in goal setting to improve functional BADL/IADL routine upon reintegrating into community    Time  4    Period  Weeks    Status  On-going    Target Date  11/26/18      OT SHORT TERM GOAL #5   Title  Pt will choose and/or engage in 1-3 socially engaging leisure activities to improve social participation upon re integrating into community    Time  4    Period  Weeks    Status  On-going    Target Date  11/26/18               Plan - 11/06/18 1204    Body Structure / Function / Physical Skills  ADL    Cognitive Skills  Attention;Emotional;Understand    Psychosocial Skills  Coping Strategies;Habits;Routines and Behaviors;Environmental  Adaptations;Interpersonal Interaction       Patient will benefit from skilled therapeutic intervention in order to improve the following deficits and impairments:   Body Structure / Function / Physical Skills: ADL Cognitive Skills: Attention, Emotional, Understand Psychosocial Skills: Coping Strategies, Habits, Routines and Behaviors, Environmental  Adaptations, Interpersonal Interaction   Visit Diagnosis: 1. Difficulty coping   2. Autism spectrum disorder   3. Mood disorder in conditions classified elsewhere       Problem List Patient Active Problem List   Diagnosis Date Noted  . Intermittent headache 10/26/2018  . Epigastric pain 10/26/2018  . Gastroesophageal reflux disease 10/26/2018  . Bipolar 2 disorder ([place]) 06/02/2018  . Panic attacks 06/02/2018  . Autism spectrum disorder 05/06/2018  .  Anxiety and depression 05/06/2018  . 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 Seizures ([place]) 04/09/2017  . Tremor, 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    Jill Bicker, [place], OTR/L [phone]  11/06/2018, 12:05 PM  [place], [place], [address] Phone: [phone]   Fax:  [phone]  Name: Jill Alexander MRN: [id number]  Date of Birth: 04/26/1996

## 2018-11-06 NOTE — Telephone Encounter (Signed)
D:  Drs. [name] and Ricky Ala, NP has referred patient for Ketamine treatments.  A:  Completed referral forms for the Ketamine (nasal) treatments.  Contacted pt to stop by and sign the form, in which she did yesterday.  Forms along with required office notes were faxed to Dr. Chucky May.  Awaiting a consultation appointment if approved.  Inform PHP team along with Dr. [name].  R:  Pt receptive.

## 2018-11-09 ENCOUNTER — Other Ambulatory Visit (HOSPITAL_COMMUNITY): Payer: No Typology Code available for payment source | Admitting: Licensed Clinical Social Worker

## 2018-11-09 ENCOUNTER — Telehealth (HOSPITAL_COMMUNITY): Payer: Self-pay | Admitting: Psychiatry

## 2018-11-09 ENCOUNTER — Other Ambulatory Visit: Payer: Self-pay

## 2018-11-09 DIAGNOSIS — F840 Autistic disorder: Secondary | ICD-10-CM

## 2018-11-09 DIAGNOSIS — F3181 Bipolar II disorder: Secondary | ICD-10-CM

## 2018-11-09 NOTE — Psych (Signed)
Virtual Visit via Video Note  I connected with Jill Alexander on 11/04/18 at  9:00 AM EDT by a video enabled telemedicine application and verified that I am speaking with the correct person using two identifiers.   I discussed the limitations of evaluation and management by telemedicine and the availability of in person appointments. The patient expressed understanding and agreed to proceed.   I discussed the assessment and treatment plan with the patient. The patient was provided an opportunity to ask questions and all were answered. The patient agreed with the plan and demonstrated an understanding of the instructions.   The patient was advised to call back or seek an in-person evaluation if the symptoms worsen or if the condition fails to improve as anticipated.  Pt was provided 240 minutes of non-face-to-face time during this encounter.   [name], LCSW    [facility] PHP THERAPIST PROGRESS NOTE  Jill Alexander [id number]  Session Time: 9:00 - 10:00  Participation Level: Active  Behavioral Response: CasualAlertAnxious  Type of Therapy: Group Therapy  Treatment Goals addressed: Coping  Interventions: CBT, DBT, Solution Focused, Supportive and Reframing  Summary: Clinician led check-in regarding current stressors and situation, and review of patient completed daily inventory. Clinician utilized active listening and empathetic response and validated patient emotions. Clinician facilitated processing group on pertinent issues.   Therapist Response: Jill Alexander is a 23 y.o. female who presents with anxiety and depression symptoms.  Patient arrived within time allowed and reports that she is feeling "not as depressed." Patient rates her mood at a 4 on a scale of 1-10 with 10 being great. Pt reports she is feeling "a little better than yesterday, but it might get worse." Pt states yesterday she went on a walk to a coffee shop and had a "meltdown." Pt shares  taking a bath to manage her emotions and "it didn't really work." Pt engaged in discussion.        Session Time: 10:00 -11:00  Participation Level:Active  Behavioral Response:CasualAlertDepressed  Type of Therapy: Group Therapy, psychoeducation, psychotherapy  Treatment Goals addressed: Coping  Interventions:CBT, DBT, Solution Focused, Supportive and Reframing  Summary: Cln led discussion on short term versus long term goals. Group members shared struggles they have with pursuing either type of goal. Cln educated on SMART goals.   Therapist Response: Pt reports familiarity with SMART goals and that she doesn't want to focus on goal setting until her "mental health is better."       Session Time: 11:00 -12:00  Participation Level:Active  Behavioral Response:CasualAlertDepressed  Type of Therapy: Group Therapy, psychotherapy  Treatment Goals addressed: Coping  Interventions:Strengths based, reframing, Supportive,   Summary:Spiritual Care group  Therapist Response: Patient engaged in group. See chaplain note.        Session Time: 12:00- 1:00  Participation Level:Active  Behavioral Response:CasualAlertDepressed  Type of Therapy: Group Therapy, Psychoeducation  Treatment Goals addressed: Coping  Interventions:Processing; Supportive; Reframing  Summary:12:00 - 12:50: Cln introduced topic of cognitive distortions and what they are. Cln educated pt's on behavior modification model: Catch, Challenge, Change and how it can be applied to cognitive distortions.   12:50 -1:00 Clinician led check-out. Clinician assessed for immediate needs, medication compliance and efficacy, and safety concerns   Therapist Response: Patient reports understanding of "catch, challenge, change" model.  At checkout, pt rates her mood at a3.5on a scale of 1-10 with 10 being great. Patient reports afternoon plans of "seeing how it goes."  Patient demonstrates some progress as  evidenced by slight improvement in mood.Patient denies SI/HI/self-harm at the end of group.      Suicidal/Homicidal: Nowithout intent/plan  Plan: Pt will continue in PHP while working to decrease anxiety and depression symptoms and increasing ability to manage symptoms as they arise and decrease emotional reactivity.   Diagnosis: Bipolar 2 disorder (HCC) [F31.81]    1. Bipolar 2 disorder (HCC)   2. Autism spectrum disorder       Donia GuilesJenny Tifanie Gardiner, LCSW 11/09/2018

## 2018-11-09 NOTE — Psych (Signed)
Virtual Visit via Video Note  I connected with Jill Alexander on 11/03/18 at  9:00 AM EDT by a video enabled telemedicine application and verified that I am speaking with the correct person using two identifiers.   I discussed the limitations of evaluation and management by telemedicine and the availability of in person appointments. The patient expressed understanding and agreed to proceed.   I discussed the assessment and treatment plan with the patient. The patient was provided an opportunity to ask questions and all were answered. The patient agreed with the plan and demonstrated an understanding of the instructions.   The patient was advised to call back or seek an in-person evaluation if the symptoms worsen or if the condition fails to improve as anticipated.  Pt was provided 240 minutes of non-face-to-face time during this encounter.   Jill GuilesJenny Juanetta Negash, LCSW    [facility] PHP THERAPIST PROGRESS NOTE  Jill Alexander [id number]  Session Time: 9:00 - 10:00  Participation Level: Active  Behavioral Response: CasualAlertAnxious  Type of Therapy: Group Therapy  Treatment Goals addressed: Coping  Interventions: CBT, DBT, Solution Focused, Supportive and Reframing  Summary: Clinician led check-in regarding current stressors and situation, and review of patient completed daily inventory. Clinician utilized active listening and empathetic response and validated patient emotions. Clinician facilitated processing group on pertinent issues.   Therapist Response: Jill Alexander is a 23 y.o. female who presents with anxiety and depression symptoms.  Patient arrived within time allowed and reports that she is feeling "really upset." Patient rates her mood at a 1 on a scale of 1-10 with 10 being great. Pt reports her morning was "really bad" and that she got upset with her family. Pt reports yesterday was also not good and she had "not a melt down but got really really upset." Pt  reports she was agitated, tearful, and yelled. Pt states she was able to calm down "eventually" and that most of what she tried "didn't work."  Pt engaged in discussion.        Session Time: 10:00 -11:00  Participation Level:Active  Behavioral Response:CasualAlertDepressed  Type of Therapy: Group Therapy, psychoeducation, psychotherapy  Treatment Goals addressed: Coping  Interventions:CBT, DBT, Solution Focused, Supportive and Reframing  Summary: Cln led discussion on coping when unable to step away to somewhere private such as at work, in class, or when watching children. Cln reviewed thought based distraction skills and ways to make self-soothing accessible. Group shared ways they have managed in those situations. Cln encouraged group members to use group as a time to practice if they begin having escalated feelings.   Therapist Response:Pt asked to step away from group "to calm down" and became more upset when cln challenged her to practice in group instead of leaving. Pt is able to again list all of her coping skills and expresses resistance to utilizing them.         Session Time: 11:00 -12:00  Participation Level:Active  Behavioral Response:CasualAlertDepressed  Type of Therapy: Group Therapy, psychoeducation, psychotherapy  Treatment Goals addressed: Coping  Interventions:CBT, DBT, Solution Focused, Supportive and Reframing  Summary:Cln continued topic of boundaries. Cln led review of previously discussed aspects of boundaries and then group worked to problem solve common issues with setting boundaries. Group members volunteered issues they have had with setting boundaries and cln and group provided  ways to manage the issue.  Therapist Response:Patient engaged in group.Pt shares not wanting to engage in conflict as a barrier to setting boundaries. Pt accepted  feedback and is able to process ways to rethink those feelings.        Session Time: 12:00- 1:00  Participation Level:Active  Behavioral Response:CasualAlertDepressed  Type of Therapy: Group Therapy, Psychoeducation; Psychotherapy  Treatment Goals addressed: Coping  Interventions:CBT; Solution focused; Supportive; Reframing  Summary:12:00 -12:50Cln led boundary workshop in which group member's shared a recent boundary issue and how they handled it. Remaining group members were cued to provide helpful and constructive feedback using their knowledge of boundaries.  12:50 -1:00 Clinician led check-out. Clinician assessed for immediate needs, medication compliance and efficacy, and safety concerns   Therapist Response: Patient shares boundary issue from this morning when she became upset with her family. Pt accepted feedback from group.  At checkout, pt rates her mood at a3.5on a scale of 1-10 with 10 being great. Patient reports afternoon plans of going on a walk and "hanging out." Patient demonstrates limited progress as evidenced by continued resistance to utilizing skills.Patient denies SI/HI/self-harm at the end of group.      Suicidal/Homicidal: Nowithout intent/plan  Plan: Pt will continue in PHP while working to decrease anxiety and depression symptoms and increasing ability to manage symptoms as they arise and decrease emotional reactivity.   Diagnosis: Bipolar 2 disorder (HCC) [F31.81]    1. Bipolar 2 disorder ([place])   2. Autism spectrum disorder       [name], LCSW 11/09/2018

## 2018-11-09 NOTE — Psych (Signed)
Virtual Visit via Video Note  I connected with Jill Alexander on 10/29/18 at  9:00 AM EDT by a video enabled telemedicine application and verified that I am speaking with the correct person using two identifiers.   I discussed the limitations of evaluation and management by telemedicine and the availability of in person appointments. The patient expressed understanding and agreed to proceed.   I discussed the assessment and treatment plan with the patient. The patient was provided an opportunity to ask questions and all were answered. The patient agreed with the plan and demonstrated an understanding of the instructions.   The patient was advised to call back or seek an in-person evaluation if the symptoms worsen or if the condition fails to improve as anticipated.  Pt was provided 240 minutes of non-face-to-face time during this encounter.   [name], LCSW    [facility] PHP THERAPIST PROGRESS NOTE  Jill Alexander [id number]  Session Time: 9:00 - 10:00  Participation Level: Active  Behavioral Response: CasualAlertAnxious  Type of Therapy: Group Therapy  Treatment Goals addressed: Coping  Interventions: CBT, DBT, Solution Focused, Supportive and Reframing  Summary: Clinician led check-in regarding current stressors and situation, and review of patient completed daily inventory. Clinician utilized active listening and empathetic response and validated patient emotions. Clinician facilitated processing group on pertinent issues.   Therapist Response: Vaani Morren is a 23 y.o. female who presents with anxiety and depression symptoms.  Patient arrived within time allowed and reports that she is feeling "down." Patient rates her mood at a 2.5 on a scale of 1-10 with 10 being great. Pt reports she is tired, feels low energy, and has a headache. Pt reports her sleep was not restful and that she was again up too early. Pt reports feeling low after group yesterday and  that is normal for her after therapy sessions. Pt states she has a difficult time rebounding from mood drops and that was a struggle throughout the afternoon. Pt reports she took a nap and walked outside to try to manage mood and it didn't work. Pt able to process. Pt engaged in discussion.        Session Time: 10:00 -11:00  Participation Level:Active  Behavioral Response:CasualAlertDepressed  Type of Therapy: Group Therapy, psychoeducation, psychotherapy  Treatment Goals addressed: Coping  Interventions:CBT, DBT, Solution Focused, Supportive and Reframing  Summary:Cln led discussion on mood fluctuations. Cln educated on ways to determine if mood fluctuations are "normal" emotions or if they are problematic and need to be addressed. Pt's discussed ways their mood influences their situation and how they tend to handle a mood swing.   Therapist Response:Pt engaged in discussion. Pt reports mood swings are a very big problem for her. Pt is able to list a number of coping skills she can use and struggles with application. Pt demonstrates limited insight and becomes upset during discussion.        Session Time: 11:00 -12:00  Participation Level:Active  Behavioral Response:CasualAlertDepressed  Type of Therapy: Group Therapy, OT  Treatment Goals addressed: Coping  Interventions:Psychosocial skills training, Supportive,   Summary:Occupational Therapy group  Therapist Response: Patient engaged in group. See OT note.          Session Time: 12:00- 1:00  Participation Level:Active  Behavioral Response:CasualAlertDepressed  Type of Therapy: Group Therapy, Psychoeducation; Psychotherapy  Treatment Goals addressed: Coping  Interventions:CBT; Solution focused; Supportive; Reframing  Summary:12:00 -12:50Clinician continued discussion on boundaries. Clinician reviewed information discussed yesterday. Group  discussed the different ways  boundaries can present including material, time, physical, and emotional and examples of each presentation. 12:50 -1:00 Clinician led check-out. Clinician assessed for immediate needs, medication compliance and efficacy, and safety concerns   Therapist Response: Patient engaged in group.Pt reports understanding of different areas of boundaries and reports that she struggles with emotional and time boundaries with herself.  At checkout, pt rates her [facility] at [facility] a scale of [facility]-10 with 10 being great. Patient reports afternoon plans of "hanging out." Patient demonstrates some progress as evidenced by identifying coping skills she can use when elevated.Patient denies SI/HI/self-harm at the end of group.      Suicidal/Homicidal: Nowithout intent/plan  Plan: Pt will continue in PHP while working to decrease anxiety and depression symptoms and increasing ability to manage symptoms as they arise and decrease emotional reactivity.   Diagnosis: Bipolar 2 disorder (HCC) [F31.81]    1. Bipolar 2 disorder (HCC)   2. Autism spectrum disorder       Donia GuilesJenny Rini Moffit, LCSW 11/09/2018

## 2018-11-09 NOTE — Psych (Signed)
Virtual Visit via Video Note  I connected with Jill Alexander on 11/02/18 at  9:00 AM EDT by a video enabled telemedicine application and verified that I am speaking with the correct person using two identifiers.   I discussed the limitations of evaluation and management by telemedicine and the availability of in person appointments. The patient expressed understanding and agreed to proceed.   I discussed the assessment and treatment plan with the patient. The patient was provided an opportunity to ask questions and all were answered. The patient agreed with the plan and demonstrated an understanding of the instructions.   The patient was advised to call back or seek an in-person evaluation if the symptoms worsen or if the condition fails to improve as anticipated.  Pt was provided 240 minutes of non-face-to-face time during this encounter.   Jill GuilesJenny Ayce Pietrzyk, LCSW    [facility] PHP THERAPIST PROGRESS NOTE  Jill Alexander [id number]  Session Time: 9:00 - 10:00  Participation Level: Active  Behavioral Response: CasualAlertAnxious  Type of Therapy: Group Therapy  Treatment Goals addressed: Coping  Interventions: CBT, DBT, Solution Focused, Supportive and Reframing  Summary: Clinician led check-in regarding current stressors and situation, and review of patient completed daily inventory. Clinician utilized active listening and empathetic response and validated patient emotions. Clinician facilitated processing group on pertinent issues.   Therapist Response: Jill Alexander is a 23 y.o. female who presents with anxiety and depression symptoms.  Patient arrived within time allowed and reports that she is feeling "not good." Patient rates her mood at a 1 on a scale of 1-10 with 10 being great. Pt reports she thinks she is having a medication issue and had some passive SI over the weekend, denying plan or intent. Pt states mornings are really difficult for her and that her  moods have been especially problematic over the weekend. Pt states she was busy throughout the weekend and that is good for her because it keeps her moods more level. Pt able to process. Pt engaged in discussion.        Session Time: 10:00 -11:00  Participation Level:Active  Behavioral Response:CasualAlertDepressed  Type of Therapy: Group Therapy, psychoeducation, psychotherapy  Treatment Goals addressed: Coping  Interventions:CBT, DBT, Solution Focused, Supportive and Reframing  Summary:Cln led group on ways to manage anxiety. Group discussed distraction activities, relaxation strategies, and mindfulness. Group members discussed strategies that have worked for them in the past and what they can try moving forward.   Therapist Response:Pt engaged in discussion. Pt states she uses a weighted blanket, aromatherapy, heated pillow, meditation apps, and calm music. Pt struggles with applying skills she knows.      Session Time: 11:00 -12:00  Participation Level:Active  Behavioral Response:CasualAlertDepressed  Type of Therapy: Group Therapy, OT  Treatment Goals addressed: Coping  Interventions:Psychosocial skills training, Supportive,   Summary:Occupational Therapy group  Therapist Response: Patient engaged in group. See OT note.        Session Time: 12:00- 1:00  Participation Level:Active  Behavioral Response:CasualAlertDepressed  Type of Therapy: Group Therapy, Psychoeducation; Psychotherapy  Treatment Goals addressed: Coping  Interventions:CBT; Solution focused; Supportive; Reframing  Summary:12:00 -12:50Cln continued topic of boundaries. Clinician provided education on how to set boundaries. Cln utilized handout "How to set a Boundary" and group went through generic examples to practice. 12:50 -1:00 Clinician led check-out. Clinician assessed for immediate needs, medication compliance and efficacy, and  safety concerns   Therapist Response: Pt reports understanding of how to set a boundary participates in  working through the examples.  At checkout, pt rates her mood at a1on a scale of 1-10 with 10 being great. Patient reports afternoon plans of doing some self-soothing and "get through the day." Patient demonstrates some progress as evidenced by coming to group, however struggles to apply what she is learning.Patient denies SI/HI/self-harm at the end of group.      Suicidal/Homicidal: Nowithout intent/plan  Plan: Pt will continue in PHP while working to decrease anxiety and depression symptoms and increasing ability to manage symptoms as they arise and decrease emotional reactivity.   Diagnosis: Bipolar 2 disorder (HCC) [F31.81]    1. 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

## 2018-11-09 NOTE — Psych (Signed)
Virtual Visit via Video Note  I connected with Jill Alexander on 10/28/18 at  9:00 AM EDT by a video enabled telemedicine application and verified that I am speaking with the correct person using two identifiers.   I discussed the limitations of evaluation and management by telemedicine and the availability of in person appointments. The patient expressed understanding and agreed to proceed.   I discussed the assessment and treatment plan with the patient. The patient was provided an opportunity to ask questions and all were answered. The patient agreed with the plan and demonstrated an understanding of the instructions.   The patient was advised to call back or seek an in-person evaluation if the symptoms worsen or if the condition fails to improve as anticipated.  Pt was provided 240 minutes of non-face-to-face time during this encounter.   [name], LCSW    [facility] PHP THERAPIST PROGRESS NOTE  Jill Alexander [id number]  Session Time: 9:00 - 10:00  Participation Level: Active  Behavioral Response: CasualAlertAnxious  Type of Therapy: Group Therapy  Treatment Goals addressed: Coping  Interventions: CBT, DBT, Solution Focused, Supportive and Reframing  Summary: Clinician led check-in regarding current stressors and situation, and review of patient completed daily inventory. Clinician utilized active listening and empathetic response and validated patient emotions. Clinician facilitated processing group on pertinent issues.   Therapist Response: Jill Alexander is a 23 y.o. female who presents with anxiety and depression symptoms.  Patient arrived within time allowed and reports that she is feeling "pretty anxious." Patient rates her mood at a 4 on a scale of 1-10 with 10 being great. Pt reports she was nervous about her first day in group and did not sleep well and was up too early. Pt states she spent yesterday doing "nothing out of the ordinary" and went  for a walk, went to a bookstore, and did paperwork. Pt reports struggling with mood swings. Pt able to process. Pt engaged in discussion.         Session Time: 10:00 -11:00  Participation Level:Active  Behavioral Response:CasualAlertDepressed  Type of Therapy: Group Therapy, psychoeducation, psychotherapy  Treatment Goals addressed: Coping  Interventions:CBT, DBT, Solution Focused, Supportive and Reframing  Summary:Cln led discussion on perspective and control. Cln presented idea that when the practical issue is out of your control, the thing you maintain control over is your perspective over the issue. Group discussed ways they can try this such as compiling evidence, mantras, and challenging the thoughts.  Therapist Response:Pt engaged in discussion. Pt reports she likes to use meditation apps that give her positive self affirmations when upset about control.      Session Time: 11:00 -12:00  Participation Level:Active  Behavioral Response:CasualAlertDepressed  Type of Therapy: Group Therapy, psychotherapy  Treatment Goals addressed: Coping  Interventions:Strengths based, reframing, Supportive,   Summary:Spiritual Care group  Therapist Response: Patient engaged in group. See chaplain note.        Session Time: 12:00- 1:00  Participation Level:Active  Behavioral Response:CasualAlertDepressed  Type of Therapy: Group Therapy, Psychoeducation  Treatment Goals addressed: Coping  Interventions:relaxation training; Supportive; Reframing  Summary:12:00 - 12:50: Relaxation group: Cln led group focused on retraining the body's response to stress.  12:50 -1:00 Clinician led check-out. Clinician assessed for immediate needs, medication compliance and efficacy, and safety concerns   Therapist Response:Pt engaged in activity and discussion. At checkout, pt rates her mood at a5.5on a scale of 1-10 with 10 being  great. Patient reports afternoon plans of taking a nap  and going outside. Patient demonstrates some progress as evidenced by participating in first session of group.Patient denies SI/HI/self-harm at the end of group.      Suicidal/Homicidal: Nowithout intent/plan  Plan: Pt will continue in PHP while working to decrease anxiety and depression symptoms and increasing ability to manage symptoms as they arise and decrease emotional reactivity.   Diagnosis: Bipolar 2 disorder (HCC) [F31.81]    1. Bipolar 2 disorder (HCC)   2. Autism spectrum disorder       Donia GuilesJenny Linell Shawn, LCSW 11/09/2018

## 2018-11-10 ENCOUNTER — Other Ambulatory Visit: Payer: Self-pay

## 2018-11-10 ENCOUNTER — Other Ambulatory Visit (HOSPITAL_COMMUNITY): Payer: No Typology Code available for payment source

## 2018-11-10 ENCOUNTER — Other Ambulatory Visit (HOSPITAL_COMMUNITY): Payer: No Typology Code available for payment source | Admitting: Licensed Clinical Social Worker

## 2018-11-10 ENCOUNTER — Ambulatory Visit (HOSPITAL_COMMUNITY): Payer: No Typology Code available for payment source

## 2018-11-10 ENCOUNTER — Encounter (HOSPITAL_COMMUNITY): Payer: Self-pay | Admitting: Psychiatry

## 2018-11-10 ENCOUNTER — Encounter (HOSPITAL_COMMUNITY): Payer: Self-pay | Admitting: Family

## 2018-11-10 DIAGNOSIS — F0630 Mood disorder due to known physiological condition, unspecified: Secondary | ICD-10-CM

## 2018-11-10 DIAGNOSIS — F3181 Bipolar II disorder: Secondary | ICD-10-CM | POA: Diagnosis not present

## 2018-11-10 DIAGNOSIS — F840 Autistic disorder: Secondary | ICD-10-CM

## 2018-11-10 NOTE — Progress Notes (Signed)
Virtual Visit via Telephone Note  I connected with [name] on 11/10/18 at  9:00 AM EDT by telephone and verified that I am speaking with the correct person using two identifiers.   I discussed the limitations, risks, security and privacy concerns of performing an evaluation and management service by telephone and the availability of in person appointments. I also discussed with the patient that there may be a patient responsible charge related to this service. The patient expressed understanding and agreed to proceed.  I discussed the assessment and treatment plan with the patient. The patient was provided an opportunity to ask questions and all were answered. The patient agreed with the plan and demonstrated an understanding of the instructions.   The patient was advised to call back or seek an in-person evaluation if the symptoms worsen or if the condition fails to improve as anticipated.  I provided 15 minutes of non-face-to-face time during this encounter.   Derrill Center, NP   [facility] Partial Hospitalization  Outpatient Program Discharge Summary  Ceara Wrightson [id number]  Admission date: 10/28/2018 Discharge date: 11/10/2018  Reason for admission: Per assessment note: Leena Tiede is a 23 y.o. Caucasian female presents with worsening depression and anxiety. She reported " I have been struggling with my mental health." Patient states she was enrolled with job traninig program  and is not attending  class at this time. Reports most of her stressors is related to "spending to much time" with her parents due to social isolation and COVID. Arshia reports she has been mediaciton compliant. denies suicidal or homicidal ideations.  Denies auditory visual hallucinations.  Charted at previous admission 05/21/2018 [name] reports a history of self injures behaviors with cutting while in middle school.  Reports diagnoses of depression, anxiety, bipolar and  states she is on the autistic spectrum.  Denies previous inpatient admissions.  Denies history of physical or sexual abuse.  Reports a family history of mental illness: Father depression: Sister 82: Bipolar depression anxiety. Patient was enrolled in partial psychiatric program on 10/28/18.   Family of Origin Issues: Reported that her family is supportive   Progress in Program Toward Treatment Goals: Ongoing, Patient attended and participated with daily group sessions. Patient was referred to Joellyn Haff for Ketamine assessment. However faxed report that patient is not candidate at this time see chart.    Progress (rationale): IOP step down   Take all medications as prescribed. Keep all follow-up appointments as scheduled.  Do not consume alcohol or use illegal drugs while on prescription medications. Report any adverse effects from your medications to your primary care provider promptly.  In the event of recurrent symptoms or worsening symptoms, call 911, a crisis hotline, or go to the nearest emergency department for evaluation.   Derrill Center, NP 11/10/2018

## 2018-11-10 NOTE — Progress Notes (Signed)
Virtual Visit via Telephone Note  I connected with Jill Alexander on 11/10/18 at  9:00 AM EDT by telephone and verified that I am speaking with the correct person using two identifiers.   I discussed the limitations, risks, security and privacy concerns of performing an evaluation and management service by telephone and the availability of in person appointments. I also discussed with the patient that there may be a patient responsible charge related to this service. The patient expressed understanding and agreed to proceed.   I discussed the assessment and treatment plan with the patient. The patient was provided an opportunity to ask questions and all were answered. The patient agreed with the plan and demonstrated an understanding of the instructions.   The patient was advised to call back or seek an in-person evaluation if the symptoms worsen or if the condition fails to improve as anticipated.  I provided minutes of non-face-to-face time during this encounter.   Jill Rackanika N Clemons Salvucci, NP    Psychiatric Initial Adult Assessment   Patient Identification: Jill Alexander:  [id number] Date of Evaluation:  11/10/2018 Referral Source: Partial l Hospitalization  Chief Complaint:  Suicide Ideation with worsening depression Visit Diagnosis: No diagnosis found.  History of Present Illness:  Jill Alexander recently completed Partial Hospitalization Programing. She continues to express depression and mood irritability.  [facility] ketamine treatment per her attending psychiatrics. Patient reported recent medications adjustment which she attributes to her worsening mood irritability, depression and passive suicidal ideations.  Increased Vraylar 4.5 mg to 6 mg p.o. daily during her partial hospitalization programming.  Patient to keep follow-up appointment as prescribed by attending psychiatrist for ketamine and/or ECT therapy.  Patient  to start intensive outpatient programming on 11/10/2018    Per admission assessment note: HPI:-Jill Alexander presents with worsening depressionand anxiety. She reported " I have been struggling with my mental health." Patient states she was enrolled with job traninig program and is not attending class at this time. Alexander most of her stressors is related to "spending to much time" with her parents due to social isolation and COVID. Jill Alexander reportsshe has been mediaciton compliant.denies suicidal or homicidal ideations. Denies auditory visual hallucinations.   Charted at previous admission 1/23/2020[name] Alexander a history of self injures behaviors with cutting while in middle school. Alexander diagnoses of depression, anxiety, bipolar and states she is on the autistic spectrum. Denies previous inpatient admissions. Denies history of physical or sexual abuse. Alexander a family history of mental illness: Father depression: Sister 434: Bipolar depression anxiety. Patient was enrolled in partial psychiatric program on 10/28/18.  Associated Signs/Symptoms: Depression Symptoms:  feelings of worthlessness/guilt, difficulty concentrating, anxiety, (Hypo) Manic Symptoms:  Distractibility, Impulsivity, Irritable Mood, Anxiety Symptoms:  Excessive Worry, Social Anxiety, Psychotic Symptoms:  Hallucinations: None PTSD Symptoms: NA  Past Psychiatric History:   Previous Psychotropic Medications: Yes   Substance Abuse History in the last 12 months:  No.  Consequences of Substance Abuse: NA  Past Medical History:  Past Medical History:  Diagnosis Date  . Anxiety   . Autism   . Depression   . GERD (gastroesophageal reflux disease)   . Heart murmur   . Seizures (HCC)   . 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 NO PAST SURGERIES    . UPPER GI ENDOSCOPY      Family Psychiatric History:   Family History:  Family History  Problem Relation Age of Onset  . Depression Father   . Anxiety disorder Sister   . Bipolar disorder Sister   . Diabetes Maternal Grandfather   . Cancer Maternal Grandfather   . Breast cancer Paternal Grandmother   . Uterine cancer Paternal Grandmother   . Irritable bowel syndrome Paternal Grandmother   . Colon cancer Neg Hx   . Esophageal cancer Neg Hx   . Stomach cancer Neg Hx   . Rectal cancer Neg Hx     Social History:   Social History   Socioeconomic History  . Marital status: Single    Spouse name: Not on file  . Number of children: 0  . Years of education: Not on file  . Highest education level: Some college, no degree  Occupational History  . Not on file  Social Needs  . Financial resource strain: Not hard at all  . Food insecurity    Worry: Never true    Inability: Never true  . Transportation needs    Medical: Yes    Non-medical: Yes  Tobacco Use  . Smoking status: Never Smoker  . Smokeless tobacco: Never Used  Substance and Sexual Activity  . Alcohol use: No  . Drug use: No  . Sexual activity: Not Currently  Lifestyle  . Physical activity    Days per week: 3 days    Minutes per session: 30 min  . Stress: Rather much  Relationships  . Social Musicianconnections    Talks on phone: Never    Gets together: More than three times a week    Attends religious service: 1 to 4 times per year    Active member of club or organization: No    Attends meetings of clubs or organizations: Never    Relationship status: Never married  Other Topics Concern  . Not on file  Social History Narrative   [place] with [place] in [place]. Pt moved here from [place] in 2018.   Caffeine use: 2-3 cups per day   Left handed     Additional Social History:   Allergies:   Allergies  Allergen Reactions  . Gluten Meal     Metabolic Disorder Labs: No results found for: HGBA1C, MPG No results found for: PROLACTIN No results found for: CHOL, TRIG, HDL, CHOLHDL,  VLDL, LDLCALC Lab Results  Component Value Date   TSH 1.340 11/17/2017    Therapeutic Level Labs: Lab Results  Component Value Date   LITHIUM 0.8 01/05/2018   No results found for: CBMZ No results found for: VALPROATE  Current Medications: Current Outpatient Medications  Medication Sig Dispense Refill  . acetaminophen (TYLENOL) 500 MG tablet Take 500 mg by mouth every 6 (six) hours as needed.    . Cariprazine HCl (VRAYLAR) 4.5 MG CAPS Take 1 capsule (4.5 mg total) by mouth daily. (Patient not taking: Reported on 11/03/2018) 30 capsule 1  . Cariprazine HCl (VRAYLAR) 6 MG CAPS Take 1 capsule (6 mg total) by mouth daily. 30 capsule 0  . Ibuprofen (MOTRIN PO) Take 200 mg by mouth as needed.     . lamoTRIgine (LAMICTAL) 150 MG tablet Take 1 tablet (150 mg total) by mouth 2 (two) times daily. 60 tablet 1  . LORazepam (ATIVAN) 2 MG tablet Take 1 tablet (2 mg total) by mouth 2 (two) times daily. 60 tablet 1  . Multiple Vitamins-Minerals (MULTIVITAMIN PO) Take 1 tablet by mouth daily.    . Norethin Ace-Eth Estrad-FE (TAYTULLA) 1-20 MG-MCG(24) CAPS Take  1 tablet by mouth daily. 28 capsule 6  . omeprazole (PRILOSEC) 40 MG capsule TAKE 1 CAPSULE BY MOUTH TWICE A DAY 60 capsule 3  . ondansetron (ZOFRAN-ODT) 8 MG disintegrating tablet TAKE 1 TABLET (8 MG TOTAL) BY MOUTH 2 (TWO) TIMES DAILY. 60 tablet 3  . polyethylene glycol (MIRALAX) packet Take 17 g by mouth daily as needed. 14 each 0  . PRESCRIPTION MEDICATION emfam patch. Use everyday     No current facility-administered medications for this visit.     Musculoskeletal: Strength & Muscle Tone: within normal limits Gait & Station: normal Patient leans: N/A  Psychiatric Specialty Exam: Review of Systems  Psychiatric/Behavioral: Positive for depression, hallucinations and suicidal ideas.    There were no vitals taken for this visit.There is no height or weight on file to calculate BMI.  General Appearance: Casual  Eye Contact:  Good   Speech:  Clear and Coherent  Volume:  Normal  Mood:  NA  Affect:  Blunt, Depressed and Flat  Thought Process:  Coherent  Orientation:  Full (Time, Place, and Person)  Thought Content:  Hallucinations: None  Suicidal Thoughts:  No  Homicidal Thoughts:  No  Memory:  Immediate;   Fair Recent;   Fair  Judgement:  Fair  Insight:  Fair  Psychomotor Activity:  Normal  Concentration:  Concentration: Fair  Recall:  Jennelle HumanFair  Fund of Knowledge:Fair  Language: Fair  Akathisia:  No  Handed:  Right  AIMS (if indicated):    Assets:  Communication Skills Desire for Improvement Resilience Social Support  ADL's:  Intact  Cognition: WNL  Sleep:  Fair   Screenings: ECT-MADRS     ECT [facility] ECT Treatment from 07/13/2018 [facility]  MADRS Total Score  24  20  37    GAD-7     Counselor from 06/01/2018 in BEHAVIORAL HEALTH PARTIAL HOSPITALIZATION PROGRAM Counselor from 05/22/2018 in BEHAVIORAL HEALTH PARTIAL HOSPITALIZATION PROGRAM [facility]  Total GAD-7 Score  18  19  15  14  18     Mini-Mental     ECT [facility] 07/31/2018 [facility] ECT Treatment from 07/13/2018 [facility] ECT Treatment from 07/06/2018 [facility]  Total Score (max 30 points )  30  30  30     PHQ2-9     Counselor from 10/29/2018 in BEHAVIORAL HEALTH PARTIAL HOSPITALIZATION PROGRAM Counselor from 06/01/2018 in BEHAVIORAL HEALTH PARTIAL HOSPITALIZATION PROGRAM Counselor from 05/22/2018 in BEHAVIORAL HEALTH PARTIAL HOSPITALIZATION PROGRAM [facility]  PHQ-2 Total Score  6  5  5  5  5   PHQ-9 Total Score  21  21  17  20  17       Assessment and Plan:  Admitted to intensive outpatient programming Continue current medications as prescribed Increase Vraylar to 6 mg daily. Keep follow-up appointment with attending psychiatrist regarding ketamine treatment and/or ECT therapy  -Per fax transmittal from psychiatrist are Dr. Jefm Miles Kuar  has declined patient for ketamine services due to patient's current diagnoses and patient's insurance.  See  chart for details. Patient was made aware of recommendation. Patient provide verbal authorization to speak to mother.   Treatment plan was reviewed and agreed upon by NP T. Delance Weide inpatient [employer] need for group services.    Derrill Center, NP 7/14/202011:12 AM

## 2018-11-10 NOTE — Progress Notes (Signed)
Virtual Visit via Video Note  I connected with Jill Alexander on 11/10/18 at 0800 by a video enabled telemedicine application and verified that I am speaking with the correct person using two identifiers.  I discussed the limitations of evaluation and management by telemedicine and the availability of in person appointments. The patient expressed understanding and agreed to proceed.  I discussed the assessment and treatment plan with the patient. The patient was provided an opportunity to ask questions and all were answered. The patient agreed with the plan and demonstrated an understanding of the instructions.  The patient was advised to call back or seek an in-person evaluation if the symptoms worsen or if the condition fails to improve as anticipated.  I provided 20 minutes of non-face-to-face time during this encounter.   As previous CCA states: Pt reports for ax for PHP per Dr. Mamie Levers. Pt shares she has anxiety and depression. Pt is dx with autism. Pt shares she has a hx of SI but no current plan/intent.  Pt states she has struggled since moving to [place] from [place] in 07/18 due to lack of social supports. Pt was working on making more strides with social supports when COVID-19 quarantine started. Other stressors include father recently losing his job and looking for a new one; father also had surgery recently and is relying on more help from pt. Pt reports increased conflict with father since being "trapped" in the same house. Pt continues to have "outbursts" and "loses temper". Pt reports hx of counseling and psychiatry; pt is currently followed by Dr. [name] for psychiatry and Melonie Florida for counseling. Pt is also currently enrolled in a DBT group through [facility] and is using a therapy light. Pt denies HI/AVH  Patient transitioned to MH-IOP from [place] today.  Was in PHP from 10-28-18 through 11-10-18.  Pt states that the groups were helpful.  Denies any current SI.  States she was having a  difficult morning, but states she was practicing a coping skill.  A:  Re-oriented pt to MH-IOP.  Answered all questions.  Pt gave verbal consent for tx, to release chart info to referred providers and to complete any forms if needed.  Pt also gave consent for attending group virtually d/t COVID-19 social distancing restrictions.  Encouraged online support groups thru [facility].  F/U with Drs. Agarwal and [employer]  R:  Pt receptive.   Carlis Abbott, RITA, M.Ed,CNA

## 2018-11-11 ENCOUNTER — Other Ambulatory Visit: Payer: Self-pay

## 2018-11-11 ENCOUNTER — Encounter (HOSPITAL_COMMUNITY): Payer: Self-pay | Admitting: Psychiatry

## 2018-11-11 ENCOUNTER — Other Ambulatory Visit (HOSPITAL_COMMUNITY): Payer: No Typology Code available for payment source | Admitting: Psychiatry

## 2018-11-11 ENCOUNTER — Other Ambulatory Visit (HOSPITAL_COMMUNITY): Payer: No Typology Code available for payment source

## 2018-11-11 DIAGNOSIS — F32A Depression, unspecified: Secondary | ICD-10-CM

## 2018-11-11 DIAGNOSIS — F0630 Mood disorder due to known physiological condition, unspecified: Secondary | ICD-10-CM

## 2018-11-11 DIAGNOSIS — F329 Major depressive disorder, single episode, unspecified: Secondary | ICD-10-CM

## 2018-11-11 DIAGNOSIS — F3181 Bipolar II disorder: Secondary | ICD-10-CM | POA: Diagnosis not present

## 2018-11-11 DIAGNOSIS — F840 Autistic disorder: Secondary | ICD-10-CM

## 2018-11-11 NOTE — Progress Notes (Signed)
Virtual Visit via Video Note  I connected with [name] on 11/11/18 at  9:00 AM EDT by a video enabled telemedicine application and verified that I am speaking with the correct person using two identifiers.  Location: Patient: Jill Alexander Provider: Lise Auer, LCSW   I discussed the limitations of evaluation and management by telemedicine and the availability of in person appointments. The patient expressed understanding and agreed to proceed.  History of Present Illness: Bipolar 2 Disorder, Autism Spectrum DO, Depression, Anxiety   Observations/Objective: Case Manager checked in with all participants to review discharge dates, insurance authorizations, work-related documents and needs for the treatment team. Counselor engaged group in an icebreaker activity to introduce new group members and get everyone acquainted. Counselor reviewed group rules, purpose, agenda and opened up for questions and concerns. Counselor walked the group through a worksheet teaching boundary setting, exploring their challenges and scenarios where boundary setting is difficult. Counselor shared a video on inner critical voice and processed strategies on silencing the negative messages that impact mental health. Counselor recited a guided imagery on dealing with rejection and failure. Group members shared their experience with the exercise. All group members shared a self-care activity or a productivity task they would complete today. All group members participated in discussions and shared personal examples.   Assessment and Plan: Counselor recommends that [name] remains in IOP treatment to better manage mental health symptoms and continue to address treatment plan goals. Counselor recommends adherence to crisis/safety plan, taking medications as prescribed and following up with medical professionals if any issues arise.   Follow Up Instructions: Counselor will send Webex link for next session.    I  discussed the assessment and treatment plan with the patient. The patient was provided an opportunity to ask questions and all were answered. The patient agreed with the plan and demonstrated an understanding of the instructions.   The patient was advised to call back or seek an in-person evaluation if the symptoms worsen or if the condition fails to improve as anticipated.  I provided 180 minutes of non-face-to-face time during this encounter.   Lise Auer, LCSW

## 2018-11-12 ENCOUNTER — Other Ambulatory Visit (HOSPITAL_COMMUNITY): Payer: No Typology Code available for payment source | Admitting: Psychiatry

## 2018-11-12 ENCOUNTER — Ambulatory Visit (HOSPITAL_COMMUNITY): Payer: No Typology Code available for payment source | Admitting: Psychiatry

## 2018-11-12 ENCOUNTER — Other Ambulatory Visit: Payer: Self-pay

## 2018-11-12 DIAGNOSIS — F3181 Bipolar II disorder: Secondary | ICD-10-CM | POA: Diagnosis not present

## 2018-11-12 NOTE — Progress Notes (Signed)
Virtual Visit via Video Note  I connected with Jill Alexander on 11/10/2018 at  9:00 AM EDT by a video enabled telemedicine application and verified that I am speaking with the correct person using two identifiers.   I discussed the limitations of evaluation and management by telemedicine and the availability of in person appointments. The patient expressed understanding and agreed to proceed.  I discussed the assessment and treatment plan with the patient. The patient was provided an opportunity to ask questions and all were answered. The patient agreed with the plan and demonstrated an understanding of the instructions.   The patient was advised to call back or seek an in-person evaluation if the symptoms worsen or if the condition fails to improve as anticipated.  I provided 180 minutes of non-face-to-face time during this encounter.   [name], LCSW     Daily Group Progress Note  Program: IOP  Group Time: 9am-12pm  Participation Level: Active  Behavioral Response: Appropriate  Type of Therapy:  Process Group, Psycho-educational group  Clinician checked in with clients assessing for SI/HI/psychosis and overall level of functioning. Clinician and group members processed topics of insecurities, putting guards up. Clinician facilitated discussion related to how insecurities and cautious relationship building effects getting personal needs met. Clinician validated client feelings and provided supportive listening.  Clinician and group members reviewed types of needs and reasons they may not be met. Clinician and group members identified how someone could reach out for help and the barriers to seeking assistance based on previous experiences.  Clients identified helpful and non-helpful ways they can be prompted to use skills.   Summary of Progress: Client engaged in group discussions, identifying need for continued work on emotional regulation. Client reported a bad  morning and difficulty maintaining a stable mood. Client identified some insecurities and how she can sometimes use her diagnosis as an excuse for behaviors.  [name], LCSW

## 2018-11-12 NOTE — Progress Notes (Signed)
Virtual Visit via Video Note  I connected with [name] on 11/12/18 at  9:00 AM EDT by a video enabled telemedicine application and verified that I am speaking with the correct person using two identifiers.  Location: Patient: Jill Alexander Provider: Lise Auer, LCSW   I discussed the limitations of evaluation and management by telemedicine and the availability of in person appointments. The patient expressed understanding and agreed to proceed.  History of Present Illness: Bipolar 2 Disorder   Observations/Objective: Case Manager checked in with all participants to review discharge dates, insurance authorizations, work-related documents and needs for the treatment team. Counselor engaged the group in getting to know the new group members and sharing about themselves. Counselor held a brief discussion on how COVID-19 restrictions have impacted group members social connections with other. Counselor introduced guest speaker, Jeanella Craze, Lead [employer], to facilitate a discussion around Grief and Loss topics. Participant shared personal grief and loss concerns and engaged in the conversation. Counselor prompted the group to journal about the G&L issues they would like to further process with their individual therapist. Counselor facilitated the creation of an Rayne with group members. Participant shared about their ECOMAP and relationship dynamics they are currently experiencing. Counselor introduced Field seismologist, [name], Yoga Instructor, to guide the group in a yoga practice. Counselor checked in with all participants to assess the benefits. Counselor closed by having each share one self-care act they would participate in today.   Assessment and Plan: Counselor recommends that [name] remains in IOP treatment to better manage mental health symptoms and continue to address treatment plan goals. Counselor recommends adherence to crisis/safety plan, taking medications as  prescribed and following up with medical professionals if any issues arise.   Follow Up Instructions: Counselor will send Webex link for next session.    I discussed the assessment and treatment plan with the patient. The patient was provided an opportunity to ask questions and all were answered. The patient agreed with the plan and demonstrated an understanding of the instructions.   The patient was advised to call back or seek an in-person evaluation if the symptoms worsen or if the condition fails to improve as anticipated.  I provided 180 minutes of non-face-to-face time during this encounter.   Lise Auer, LCSW

## 2018-11-13 ENCOUNTER — Ambulatory Visit (HOSPITAL_COMMUNITY): Payer: No Typology Code available for payment source

## 2018-11-13 ENCOUNTER — Other Ambulatory Visit: Payer: Self-pay

## 2018-11-13 ENCOUNTER — Other Ambulatory Visit (HOSPITAL_COMMUNITY): Payer: No Typology Code available for payment source

## 2018-11-13 ENCOUNTER — Other Ambulatory Visit (HOSPITAL_COMMUNITY): Payer: No Typology Code available for payment source | Admitting: Licensed Clinical Social Worker

## 2018-11-13 DIAGNOSIS — F3181 Bipolar II disorder: Secondary | ICD-10-CM

## 2018-11-13 NOTE — Telephone Encounter (Signed)
Patient called asked is she can still do the gastric emptying

## 2018-11-13 NOTE — Telephone Encounter (Signed)
Yes that's fine we can proceed if she is okay with it. thanks

## 2018-11-13 NOTE — Telephone Encounter (Signed)
Do you want me to order this GES ?

## 2018-11-14 ENCOUNTER — Ambulatory Visit (INDEPENDENT_AMBULATORY_CARE_PROVIDER_SITE_OTHER): Payer: No Typology Code available for payment source | Admitting: Clinical

## 2018-11-14 DIAGNOSIS — F840 Autistic disorder: Secondary | ICD-10-CM | POA: Diagnosis not present

## 2018-11-16 ENCOUNTER — Telehealth: Payer: Self-pay

## 2018-11-16 ENCOUNTER — Other Ambulatory Visit: Payer: Self-pay

## 2018-11-16 ENCOUNTER — Other Ambulatory Visit (HOSPITAL_COMMUNITY): Payer: No Typology Code available for payment source

## 2018-11-16 ENCOUNTER — Telehealth (HOSPITAL_COMMUNITY): Payer: Self-pay | Admitting: Psychiatry

## 2018-11-16 ENCOUNTER — Other Ambulatory Visit (HOSPITAL_COMMUNITY): Payer: No Typology Code available for payment source | Admitting: Psychiatry

## 2018-11-16 DIAGNOSIS — R4589 Other symptoms and signs involving emotional state: Secondary | ICD-10-CM

## 2018-11-16 DIAGNOSIS — K219 Gastro-esophageal reflux disease without esophagitis: Secondary | ICD-10-CM

## 2018-11-16 DIAGNOSIS — F3181 Bipolar II disorder: Secondary | ICD-10-CM | POA: Diagnosis not present

## 2018-11-16 NOTE — Progress Notes (Signed)
Virtual Visit via Video Note  I connected with [name] on 11/16/18 at  9:00 AM EDT by a video enabled telemedicine application and verified that I am speaking with the correct person using two identifiers.  Location: Patient: Jill Alexander Provider: Lise Auer, LCSW   I discussed the limitations of evaluation and management by telemedicine and the availability of in person appointments. The patient expressed understanding and agreed to proceed.  History of Present Illness:    Observations/Objective: Case Manager checked in with all participants to review discharge dates, insurance authorizations, work-related documents and needs for the treatment team. Counselor checked in with all group members to assess how their weekends were and to introduce everyone to assess how they are applying coping skills. Counselor provided psychoeducation on the ACEs Study, administered the ACEs Assessment Tool, and discussed the results with all group members. Counselor allowed time for group members to share their connections with childhood trauma and how it is currently impacting them today.  Around this time [name] shared that the information being covered today was triggering for her. She was offered the opportunity to express her thoughts and feelings within the group, but she decided to quit the group early to do self-care. Case manager followed up with her to ensure safety.    Assessment and Plan: Counselor recommends that [name] remains in IOP treatment to better manage mental health symptoms and continue to address treatment plan goals. Counselor recommends adherence to crisis/safety plan, taking medications as prescribed and following up with medical professionals if any issues arise.   Follow Up Instructions: Counselor will send Webex link for next session.    I discussed the assessment and treatment plan with the patient. The patient was provided an opportunity to ask questions and all  were answered. The patient agreed with the plan and demonstrated an understanding of the instructions.   The patient was advised to call back or seek an in-person evaluation if the symptoms worsen or if the condition fails to improve as anticipated.  I provided 105 minutes of non-face-to-face time during this encounter.   Lise Auer, LCSW

## 2018-11-16 NOTE — Telephone Encounter (Signed)
Patient scheduled for a Gastric Emptying Study at [facility] 11/27/18 patient to arrive at 10:30am and to be NPO 4 hrs. before procedure. Patient notified to hold her Prilosec and Zofran that morning. And that the procedure should take about 4 1/2 hrs. Also sent the information to her My Chart.

## 2018-11-16 NOTE — Telephone Encounter (Signed)
D:  Returned call back to pt.  Pt sates she logged off virtual MH-IOP early today (1040) d/t having a difficult time.  States something was said, which triggered her to feel even more sad and anxious.  Pt denies SI/HI or A//V hallucinations.  A:  Provided pt with support.  Encouraged pt to practice her coping skills.  R:  Pt receptive.

## 2018-11-16 NOTE — Telephone Encounter (Signed)
Left message to call back  

## 2018-11-17 ENCOUNTER — Other Ambulatory Visit (HOSPITAL_COMMUNITY): Payer: No Typology Code available for payment source | Admitting: Licensed Clinical Social Worker

## 2018-11-17 ENCOUNTER — Ambulatory Visit (HOSPITAL_COMMUNITY): Payer: No Typology Code available for payment source

## 2018-11-17 ENCOUNTER — Other Ambulatory Visit: Payer: Self-pay

## 2018-11-17 ENCOUNTER — Other Ambulatory Visit (HOSPITAL_COMMUNITY): Payer: No Typology Code available for payment source

## 2018-11-17 DIAGNOSIS — F3181 Bipolar II disorder: Secondary | ICD-10-CM | POA: Diagnosis not present

## 2018-11-17 NOTE — Psych (Addendum)
Virtual Visit via Video Note  I connected with Jill Alexander on 11/09/18 at  9:00 AM EDT by a video enabled telemedicine application and verified that I am speaking with the correct person using two identifiers.   I discussed the limitations of evaluation and management by telemedicine and the availability of in person appointments. The patient expressed understanding and agreed to proceed.   I discussed the assessment and treatment plan with the patient. The patient was provided an opportunity to ask questions and all were answered. The patient agreed with the plan and demonstrated an understanding of the instructions.   The patient was advised to call back or seek an in-person evaluation if the symptoms worsen or if the condition fails to improve as anticipated.  Pt was provided 240 minutes of non-face-to-face time during this encounter.   [name], LCSW    [facility] THERAPIST PROGRESS NOTE  Jill BickerMadeline Sarah Mcnee [id number]  Session Time: 9:00 - 10:00  Participation Level: Active  Behavioral Response: CasualAlertAnxious  Type of Therapy: Group Therapy  Treatment Goals addressed: Coping  Interventions: CBT, DBT, Solution Focused, Supportive and Reframing  Summary: Clinician led check-in regarding current stressors and situation, and review of patient completed daily inventory. Clinician utilized active listening and empathetic response and validated patient emotions. Clinician facilitated processing group on pertinent issues.   Therapist Response: Jill Alexander is a 23 y.o. female who presents with anxiety and depression symptoms.  Patient arrived within time allowed and reports that she is feeling "not great." Patient rates her mood at a 1.5 on a scale of 1-10 with 10 being great. Pt reports her weekend was "okay" and that she did job training, and spent time out with her mom. Pt states today is hard because she does not do well with change and today is her  last day in group and her mom's first day with a new work schedule. Pt states her mom is normally sleeping when she is home due to working nights, but it still feels different with her not in the house. Pt able to process. Pt engaged in discussion.         Session Time: 10:00 -11:00  Participation Level:Active  Behavioral Response:CasualAlertDepressed  Type of Therapy: Group Therapy, psychoeducation, psychotherapy  Treatment Goals addressed: Coping  Interventions:CBT, DBT, Solution Focused, Supportive and Reframing  Summary:Cln led discussion on how to increase communication when it is a struggle for you to be open. Cln brought in topics of perspective, boundaries, and communication techniques as applicable. Group engaged in feedback for how to tackle different group members' communication struggles.   Therapist Response:Pt engaged in discussion. Pt reports feeling her emotions get in the way of being open with others. Pt states she cannot remain "even" enough to manage good communication. Pt reports willingness to try taking a time out when upset.        Session Time: 11:00 -12:00  Participation Level:Active  Behavioral Response:CasualAlertDepressed  Type of Therapy: Group Therapy, psychoeducation, psychotherapy  Treatment Goals addressed: Coping  Interventions:CBT, DBT, Solution Focused, Supportive and Reframing  Summary:Cln continued topic of cognitive distortions. Group discussed how to "challenge" the unhealthy thought patterns once recognized. Group reviewed "Socratic Questions" handout and how they can be used to challenge irrational thoughts.  Therapist Response:Pt states understanding of how to use socratic questions to challenge cognitive distortions and participates in talking them through.         Session Time: 12:00- 1:00  Participation Level:Active  Behavioral Response:CasualAlertDepressed  Type of  Therapy: Group Therapy, Psychoeducation; Psychotherapy  Treatment Goals addressed: Coping  Interventions:CBT; Solution focused; Supportive; Reframing  Summary:12:00 -12:50Cln continued topic of cognitive distortions with practice of "catch" and "challenge" by use of example statements. Group worked together to recognize the distorted thoughts and think of different ways to bring logic back into the equation. 12:50 -1:00 Clinician led check-out. Clinician assessed for immediate needs, medication compliance and efficacy, and safety concerns   Therapist Response:Pt is able to recognize problematic thoughts and come up with challenges for examples.  At checkout, pt rates her mood at a4 on a scale of 1-10 with 10 being great. Patient reports afternoon plans of attending a virtual game night for adults with autism and she is "kind of excited" about it. Patient demonstrates some progress as evidenced by increased willingness to try new things.Patient denies SI/HI/self-harm at the end of group.    Suicidal/Homicidal: Nowithout intent/plan  Plan: Pt will discharge from PHP due to meeting treatment goals of decreased anxiety and depression symptoms and increased ability to manage symptoms as they arise and decrease emotional reactivity. Pt will step down to IOP within this agency on 7/14 to increase further stabilization. Pt and provider are aligned with discharge. Pt denies SI/HI at time of discharge.   Diagnosis: Bipolar 2 disorder (HCC) [F31.81]    1. Bipolar 2 disorder ([place])   2. Autism spectrum disorder       Jill Glass, LCSW 11/17/2018

## 2018-11-17 NOTE — Psych (Signed)
Virtual Visit via Video Note  I connected with Jill Alexander on 11/06/18 at  9:00 AM EDT by a video enabled telemedicine application and verified that I am speaking with the correct person using two identifiers.   I discussed the limitations of evaluation and management by telemedicine and the availability of in person appointments. The patient expressed understanding and agreed to proceed.   I discussed the assessment and treatment plan with the patient. The patient was provided an opportunity to ask questions and all were answered. The patient agreed with the plan and demonstrated an understanding of the instructions.   The patient was advised to call back or seek an in-person evaluation if the symptoms worsen or if the condition fails to improve as anticipated.  Pt was provided 240 minutes of non-face-to-face time during this encounter.   [name], LCSW    [facility] PHP THERAPIST PROGRESS NOTE  Jill Alexander [id number]  Session Time: 9:00 - 10:00  Participation Level: Active  Behavioral Response: CasualAlertAnxious  Type of Therapy: Group Therapy  Treatment Goals addressed: Coping  Interventions: CBT, DBT, Solution Focused, Supportive and Reframing  Summary: Clinician led check-in regarding current stressors and situation, and review of patient completed daily inventory. Clinician utilized active listening and empathetic response and validated patient emotions. Clinician facilitated processing group on pertinent issues.   Therapist Response: Jill Alexander is a 23 y.o. female who presents with anxiety and depression symptoms.  Patient arrived within time allowed and reports that she is feeling "ok, but tired." Patient rates her mood at a 4 on a scale of 1-10 with 10 being great. Pt reports her afternoon yesterday was "ok" and he ran errands with her mom and went to get coffee. Pt reports continued struggles with feeling increased anxiety after group. Pt  shares sleep was decreased last night due to racing thoughts. Pt engaged in discussion.         Session Time: 10:00 -11:00  Participation Level:Active  Behavioral Response:CasualAlertDepressed  Type of Therapy: Group Therapy, psychoeducation, psychotherapy  Treatment Goals addressed: Coping  Interventions:CBT, DBT, Solution Focused, Supportive and Reframing  Summary:Cln led discussion on ways to manage emotional reactions while out in public. Cln brought in grounding and distress tolerance skills as needed. Group members shared their struggles with managing in public and provided feedback to one another.   Therapist Response:Pt engaged in discussion. Pt reports feeling very self conscious in public and as if she does not know how to fit in. Pt identifies self-soothe skills she can use to manage.         Session Time: 11:00 -12:00  Participation Level:Active  Behavioral Response:CasualAlertDepressed  Type of Therapy: Group Therapy, OT  Treatment Goals addressed: Coping  Interventions:Psychosocial skills training, Supportive,   Summary:Occupational Therapy group  Therapist Response: Patient engaged in group. See OT note.       Session Time: 12:00- 1:00  Participation Level:Active  Behavioral Response:CasualAlertDepressed  Type of Therapy: Group Therapy, Psychoeducation; Psychotherapy  Treatment Goals addressed: Coping  Interventions:CBT; Solution focused; Supportive; Reframing  Summary:12:00 -12:50Cln continued topic of cognitive distortions. Group utilized handout "unhealthy thought patterns" and brainstormed examples of each pattern.  12:50 -1:00 Clinician led check-out. Clinician assessed for immediate needs, medication compliance and efficacy, and safety concerns   Therapist Response:Pt reports understanding of topic discussed and reports struggling with false permanence, self-centeredness,  and blame.  At checkout, pt rates her mood at a4.5 on a scale of 1-10 with 10 being great. Patient reports  afternoon plans of taking a nap. Patient demonstrates some progress as evidenced by recognizing negative thinking.Patient denies SI/HI/self-harm at the end of group.      Suicidal/Homicidal: Nowithout intent/plan  Plan: Pt will continue in PHP while working to decrease anxiety and depression symptoms and increasing ability to manage symptoms as they arise and decrease emotional reactivity.   Diagnosis: Bipolar 2 disorder (HCC) [F31.81]    1. Bipolar 2 disorder (HCC)   2. Autism spectrum disorder       Jill GuilesJenny Teale Goodgame, LCSW 11/17/2018

## 2018-11-17 NOTE — Psych (Signed)
Virtual Visit via Video Note  I connected with Jill Alexander on 11/05/18 at  9:00 AM EDT by a video enabled telemedicine application and verified that I am speaking with the correct person using two identifiers.   I discussed the limitations of evaluation and management by telemedicine and the availability of in person appointments. The patient expressed understanding and agreed to proceed.   I discussed the assessment and treatment plan with the patient. The patient was provided an opportunity to ask questions and all were answered. The patient agreed with the plan and demonstrated an understanding of the instructions.   The patient was advised to call back or seek an in-person evaluation if the symptoms worsen or if the condition fails to improve as anticipated.  Pt was provided 240 minutes of non-face-to-face time during this encounter.   Jill Glass, LCSW    [facility] PHP THERAPIST PROGRESS NOTE  Jill Alexander [id number]  Session Time: 9:00 - 10:00  Participation Level: Active  Behavioral Response: CasualAlertAnxious  Type of Therapy: Group Therapy  Treatment Goals addressed: Coping  Interventions: CBT, DBT, Solution Focused, Supportive and Reframing  Summary: Clinician led check-in regarding current stressors and situation, and review of patient completed daily inventory. Clinician utilized active listening and empathetic response and validated patient emotions. Clinician facilitated processing group on pertinent issues.   Therapist Response: Jill Alexander is a 23 y.o. female who presents with anxiety and depression symptoms.  Patient arrived within time allowed and reports that she is feeling "agitated." Patient rates her mood at a 1.5 on a scale of 1-10 with 10 being great. Pt reports she woke up agitated and then had an argument with her father which upset her more. Pt is able to list approximately 7 self soothe skills she can use when upset and/or  agitated. Pt encouraged to add distraction skills as well. Pt engaged in discussion.        Session Time: 10:00 -11:00  Participation Level:Active  Behavioral Response:CasualAlertDepressed  Type of Therapy: Group Therapy, psychoeducation, psychotherapy  Treatment Goals addressed: Coping  Interventions:CBT, DBT, Solution Focused, Supportive and Reframing  Summary:Cln led discussion on how to focus on what we can control in terms of relationships. Group discussed issues they are having with people in their lives and how to either ask for what they would like, or if unable to do so, to change perspective to decrease misery.   Therapist Response:Pt engaged in discussion. Pt states she struggles to change her perspective however understands the concept rationally.        Session Time: 11:00 -12:00  Participation Level:Active  Behavioral Response:CasualAlertDepressed  Type of Therapy: Group Therapy, OT  Treatment Goals addressed: Coping  Interventions:Psychosocial skills training, Supportive,   Summary:Occupational Therapy group  Therapist Response: Patient engaged in group. See OT note.        Session Time: 12:00- 1:00  Participation Level:Active  Behavioral Response:CasualAlertDepressed  Type of Therapy: Group Therapy, Psychoeducation; Psychotherapy  Treatment Goals addressed: Coping  Interventions:CBT; Solution focused; Supportive; Reframing  Summary:12:00 -12:50Cln continued topic of cognitive distortions. Group utilized handout "cognitive distortions" and brainstormed examples of each distortion.  12:50 -1:00 Clinician led check-out. Clinician assessed for immediate needs, medication compliance and efficacy, and safety concerns   Therapist Response: Pt reports understanding of topic discussed and reports struggling with catastrophizing, personalization, minimization,  and mind reading. At checkout,  pt rates her mood at a5 on a scale of 1-10 with 10 being great. Patient reports afternoon plans of  taking a nap and "making it through." Patient demonstrates some progress as evidenced by ability to list skills and needs continued work on applying skills.Patient denies SI/HI/self-harm at the end of group.      Suicidal/Homicidal: Nowithout intent/plan  Plan: Pt will continue in PHP while working to decrease anxiety and depression symptoms and increasing ability to manage symptoms as they arise and decrease emotional reactivity.   Diagnosis: Bipolar 2 disorder (HCC) [F31.81]    1. Bipolar 2 disorder (HCC)   2. Autism spectrum disorder       Jill GuilesJenny Azoria Abbett, LCSW 11/17/2018

## 2018-11-18 ENCOUNTER — Encounter (HOSPITAL_COMMUNITY): Payer: Self-pay

## 2018-11-18 ENCOUNTER — Other Ambulatory Visit: Payer: Self-pay

## 2018-11-18 ENCOUNTER — Ambulatory Visit: Payer: Self-pay | Admitting: Clinical

## 2018-11-18 ENCOUNTER — Other Ambulatory Visit (HOSPITAL_COMMUNITY): Payer: No Typology Code available for payment source | Admitting: Psychiatry

## 2018-11-18 ENCOUNTER — Other Ambulatory Visit (HOSPITAL_COMMUNITY): Payer: No Typology Code available for payment source

## 2018-11-18 DIAGNOSIS — R4589 Other symptoms and signs involving emotional state: Secondary | ICD-10-CM

## 2018-11-18 DIAGNOSIS — F3181 Bipolar II disorder: Secondary | ICD-10-CM

## 2018-11-18 NOTE — Progress Notes (Signed)
Virtual Visit via Video Note  I connected with Jill Alexander on 11/18/18 at  9:00 AM EDT by a video enabled telemedicine application and verified that I am speaking with the correct person using two identifiers.  Location: Patient: Jill Alexander Provider: Lise Auer, LCSW   I discussed the limitations of evaluation and management by telemedicine and the availability of in person appointments. The patient expressed understanding and agreed to proceed.  History of Present Illness: Biploar 2 and Difficulty coping.    Observations/Objective: Case Manager checked in with all participants to review discharge dates, insurance authorizations, work-related documents and needs for the treatment team. Counselor engaged the group by asking them to share about the most beautiful place they have ever visited. Group members each shared and Counselor encouraged to use guided imagery as a coping skill to escape to their "happy places". Counselor opened up for the group to share resources for mental health management. Group members shared links, apps, locations, [employer], etc. Counselor shared an exhaustive list with the group on local, state and national mental health and substance abuse resources. After break the group worked on their safety and crisis plans. Counselor encouraged group members to share their plans with their support system and mental health providers for easy access in case of an emergency. Counselor noted progress of individuals in the group. Counselor closed by having group members share a self-care task and productivity activity they will do today.  Jill Alexander participated well in session today, she was more alert, present and engaged, as well as talkative. Other group members noticed these positive changes in her and shared their observations. She had insightful feedback to share with group members.   Assessment and Plan: Counselor recommends that Jill Alexander remains in IOP  treatment to better manage mental health symptoms and continue to address treatment plan goals. Counselor recommends adherence to crisis/safety plan, taking medications as prescribed and following up with medical professionals if any issues arise.   Follow Up Instructions: Counselor will send Webex link for next session.    I discussed the assessment and treatment plan with the patient. The patient was provided an opportunity to ask questions and all were answered. The patient agreed with the plan and demonstrated an understanding of the instructions.   The patient was advised to call back or seek an in-person evaluation if the symptoms worsen or if the condition fails to improve as anticipated.  I provided 180 minutes of non-face-to-face time during this encounter.   Lise Auer, LCSW

## 2018-11-19 ENCOUNTER — Other Ambulatory Visit (HOSPITAL_BASED_OUTPATIENT_CLINIC_OR_DEPARTMENT_OTHER): Payer: No Typology Code available for payment source | Admitting: Licensed Clinical Social Worker

## 2018-11-19 ENCOUNTER — Telehealth: Payer: Self-pay | Admitting: Gastroenterology

## 2018-11-19 ENCOUNTER — Other Ambulatory Visit: Payer: Self-pay

## 2018-11-19 ENCOUNTER — Other Ambulatory Visit (HOSPITAL_COMMUNITY): Payer: No Typology Code available for payment source

## 2018-11-19 ENCOUNTER — Ambulatory Visit (HOSPITAL_COMMUNITY): Payer: No Typology Code available for payment source

## 2018-11-19 DIAGNOSIS — F3181 Bipolar II disorder: Secondary | ICD-10-CM

## 2018-11-19 NOTE — Telephone Encounter (Signed)
Patient called and cancelled her Gastric Emptying Study, said she can't do it right now, that she will call and reschedule

## 2018-11-19 NOTE — Telephone Encounter (Signed)
Pt called to cancel gastric emptying study, she stated that she cannot do it at this moment and will cb to r/s.

## 2018-11-19 NOTE — Patient Instructions (Signed)
D:  Patient will complete MH-IOP tomorrow (11-20-18).  A:  Discharge tomorrow.  Follow up with Dr. Mamie Levers on 11-20-18 @ 12 noon and Dr. [name] on 11-26-18 @ 11:45 am.  Encouraged support groups through [facility].  R:  Patient receptive.

## 2018-11-20 ENCOUNTER — Other Ambulatory Visit (HOSPITAL_BASED_OUTPATIENT_CLINIC_OR_DEPARTMENT_OTHER): Payer: No Typology Code available for payment source | Admitting: Licensed Clinical Social Worker

## 2018-11-20 ENCOUNTER — Ambulatory Visit (INDEPENDENT_AMBULATORY_CARE_PROVIDER_SITE_OTHER): Payer: No Typology Code available for payment source | Admitting: Clinical

## 2018-11-20 ENCOUNTER — Other Ambulatory Visit: Payer: Self-pay

## 2018-11-20 ENCOUNTER — Ambulatory Visit (HOSPITAL_COMMUNITY): Payer: No Typology Code available for payment source

## 2018-11-20 ENCOUNTER — Other Ambulatory Visit (HOSPITAL_COMMUNITY): Payer: No Typology Code available for payment source

## 2018-11-20 DIAGNOSIS — F840 Autistic disorder: Secondary | ICD-10-CM | POA: Diagnosis not present

## 2018-11-20 DIAGNOSIS — F419 Anxiety disorder, unspecified: Secondary | ICD-10-CM | POA: Diagnosis not present

## 2018-11-20 DIAGNOSIS — F329 Major depressive disorder, single episode, unspecified: Secondary | ICD-10-CM

## 2018-11-20 DIAGNOSIS — F3181 Bipolar II disorder: Secondary | ICD-10-CM | POA: Diagnosis not present

## 2018-11-20 DIAGNOSIS — F32A Depression, unspecified: Secondary | ICD-10-CM

## 2018-11-20 NOTE — Progress Notes (Signed)
     Virtual Visit via Video Note  I connected with Jill Alexander on 11/20/18 at 0800 by a video enabled telemedicine application and verified that I am speaking with the correct person using two identifiers.   I discussed the limitations of evaluation and management by telemedicine and the availability of in person appointments. The patient expressed understanding and agreed to proceed.  I discussed the assessment and treatment plan with the patient. The patient was provided an opportunity to ask questions and all were answered. The patient agreed with the plan and demonstrated an understanding of the instructions.   The patient was advised to call back or seek an in-person evaluation if the symptoms worsen or if the condition fails to improve as anticipated.  I provided 20 minutes of non-face-to-face time during this encounter.    As previous CCA states: Pt reports for ax for PHP per Dr. Mamie Levers. Pt shares she has anxiety and depression. Pt is dx with autism. Pt shares she has a hx of SI but no current plan/intent. Pt states she has struggled since moving to [place] from [place] in 07/18 due to lack of social supports. Pt was working on making more strides with social supports when COVID-19 quarantine started. Other stressors include father recently losing his job and looking for a new one; father also had surgery recently and is relying on more help from pt. Pt reports increased conflict with father since being "trapped"in the same house. Pt continues to have "outbursts" and "loses temper". Pt reports hx of counseling and psychiatry; pt is currently followed by Dr. [name] for psychiatry and Melonie Florida for counseling. Pt is also currently enrolled in a DBT group through [facility] and is using a therapy light. Pt denies HI/AVH  Patient transitioned to MH-IOP from [facility] today.  Was in PHP from 10-28-18 through 11-10-18.  Pt states that the groups were helpful.  Denies any current SI.  States  she was having a difficult morning, but states she was practicing a coping skill.  A:  Re-oriented pt to MH-IOP.  Answered all questions.  Pt gave verbal consent for tx, to release chart info to referred providers and to complete any forms if needed.  Pt also gave consent for attending group virtually d/t COVID-19 social distancing restrictions.  Encouraged online support groups thru [facility].   Patient completed MH-IOP today.  States she feels the same.  "I feel no better, maybe even a little worse."  According to pt, her mother is trying to get her into a residential facility.  Denies SI/HI or A/V hallucinations.  A:  D/C today.   F/U with Drs. Agarwal 11-26-18 and [place] today at 12 noon.  R:  Pt receptive.    Dellia Nims, M.Ed, CNA

## 2018-11-20 NOTE — Progress Notes (Signed)
   Virtual Visit via Telephone Note  I connected with [name] on 11/20/18 at  9:00 AM EDT by telephone and verified that I am speaking with the correct person using two identifiers.   I discussed the limitations, risks, security and privacy concerns of performing an evaluation and management service by telephone and the availability of in person appointments. I also discussed with the patient that there may be a patient responsible charge related to this service. The patient expressed understanding and agreed to proceed.   I discussed the assessment and treatment plan with the patient. The patient was provided an opportunity to ask questions and all were answered. The patient agreed with the plan and demonstrated an understanding of the instructions.   The patient was advised to call back or seek an in-person evaluation if the symptoms worsen or if the condition fails to improve as anticipated.  I provided 15 minutes of non-face-to-face time during this encounter.   Derrill Center, NP   [facility] Intensive Outpatient Program Discharge Summary  Jill Alexander [id number]  Admission date: 10/28/2018 Discharge date: 11/20/2018  Reason for admission:  Per admission assessment note: HPI:-Jill Lytleis a 23 y.o. Caucasian female presents with worsening depressionand anxiety. She reported " I have been struggling with my mental health." Patient states she was enrolled with job traninig program and is not attending class at this time. Reports most of her stressors is related to "spending to much time" with her parents due to social isolation and COVID. Lenea reportsshe has been mediaciton compliant.denies suicidal or homicidal ideations. Denies auditory visual hallucinations.    Family of Origin Issues:  Patient continues to report that her mother/family  is supportive.   Progress in Program Toward Treatment Goals: Ongoing, patient attended and  participated with with daily group session with active and engaged participation.  She recently completed partial hospitalization programming and intensive outpatient programming.  Very lar was increased during her partial hospitalization programming patient to continue medications as directed.  [name] was referred for ketamine treatment assessment.  Please see chart for recommendations by [name] .  Patient to keep follow-up with attending psychiatrist [name] and consider ECT as documented in psychiatry notes.  Progress (rationale): Keep follow-up with Dr. [name] 7/30 at 11:45  And Dr. [name] today at 12:00.  Take all medications as prescribed. Keep all follow-up appointments as scheduled.  Do not consume alcohol or use illegal drugs while on prescription medications. Report any adverse effects from your medications to your primary care provider promptly.  In the event of recurrent symptoms or worsening symptoms, call 911, a crisis hotline, or go to the nearest emergency department for evaluation.    Derrill Center, NP 11/20/2018

## 2018-11-23 ENCOUNTER — Other Ambulatory Visit (HOSPITAL_COMMUNITY): Payer: No Typology Code available for payment source

## 2018-11-23 ENCOUNTER — Other Ambulatory Visit: Payer: Self-pay

## 2018-11-24 ENCOUNTER — Other Ambulatory Visit (HOSPITAL_COMMUNITY): Payer: Self-pay | Admitting: Family

## 2018-11-24 ENCOUNTER — Ambulatory Visit (HOSPITAL_COMMUNITY): Payer: No Typology Code available for payment source

## 2018-11-24 ENCOUNTER — Other Ambulatory Visit (HOSPITAL_COMMUNITY): Payer: No Typology Code available for payment source

## 2018-11-24 NOTE — Progress Notes (Signed)
Virtual Visit via Video Note  I connected with Jill Alexander on 11/17/18 at  9:00 AM EDT by a video enabled telemedicine application and verified that I am speaking with the correct person using two identifiers.   I discussed the limitations of evaluation and management by telemedicine and the availability of in person appointments. The patient expressed understanding and agreed to proceed.  I discussed the assessment and treatment plan with the patient. The patient was provided an opportunity to ask questions and all were answered. The patient agreed with the plan and demonstrated an understanding of the instructions.   The patient was advised to call back or seek an in-person evaluation if the symptoms worsen or if the condition fails to improve as anticipated.  I provided 180 minutes of non-face-to-face time during this encounter.   Jill Messier, LCSW     Daily Group Progress Note  Program: IOP  Group Time: 9am-12pm  Participation Level: Active  Behavioral Response: Appropriate  Type of Therapy:  Group Therapy; psycho-educational group, process group  Summary of Progress:  Clinician met with group members, assessing for SI/HI/psychosis and overall level of functioning. Clinician inquired about self-care from the previous day and skills attempted. Clinician and group members discussed healthy guilt vs guilt and shame. Clinician utilized Almond Lint video as supplemental material. Clinician facilitated discussion on self-compassion and self-empathy in repose to thoughts of perfectionism. Clinician provided activity to focus on client strengths and improving positive self-talk.  Client identifies continuing to struggle to make friends due to boundary concerns and self-consiousnes. Client reports continued struggle with negative self talk and trouble finding positive traits about self, even when prompted. Client shows some progress with identifying triggers for negative self  talk but continues struggling to implement learned skills on a regular basis.   Jill Messier, LCSW

## 2018-11-24 NOTE — Progress Notes (Signed)
  Virtual Visit via Video Note  I connected with Jill Alexander on 11/13/2018 at  9:00 AM EDT by a video enabled telemedicine application and verified that I am speaking with the correct person using two identifiers.   I discussed the limitations of evaluation and management by telemedicine and the availability of in person appointments. The patient expressed understanding and agreed to proceed.  I discussed the assessment and treatment plan with the patient. The patient was provided an opportunity to ask questions and all were answered. The patient agreed with the plan and demonstrated an understanding of the instructions.   The patient was advised to call back or seek an in-person evaluation if the symptoms worsen or if the condition fails to improve as anticipated.  I provided 180 minutes of non-face-to-face time during this encounter.   [name], LCSW    Daily Group Progress Note  Program: IOP  Group Time: 9am-12pm  Participation Level: Active  Behavioral Response: Appropriate  Type of Therapy:  Group Therapy; process group, psycho-educational group  Summary of Progress:  Clinician met with clients assessing for SI/HI/psychosis and overall level of functioning. Clinician inquired about recent stressors and skills used to address Clinician and group members practiced having moments of vulnerability and using distress tolerance skills to discuss loaded questions with topics including judgement and criticism of self from others.   Clinician provided psycho-educational information on types of coping skills and encouraged group members to become creative with personalized examples. Clinician reviewed the TIPP skill to address overwhelming emotions. Clinician and group members practiced box breathing and 5-7-9. Clinician encouraged client to practice a skill daily to gain mastery for use in difficult situations.  Client actively listened to group discussions and engaged  when prompted with other group members. Client was able to identify alternative thoughts for self critical thoughts. Client agreed to practice breathing skill daily. Client notes sensory skills such as aroma therapy and temperature changes, such as her scented and heated neck rest, are most helpful in a home setting for managing uncomfortable emotions.   [name], LCSW

## 2018-11-25 ENCOUNTER — Other Ambulatory Visit (HOSPITAL_COMMUNITY): Payer: No Typology Code available for payment source

## 2018-11-25 ENCOUNTER — Ambulatory Visit (HOSPITAL_COMMUNITY): Payer: No Typology Code available for payment source

## 2018-11-26 ENCOUNTER — Other Ambulatory Visit: Payer: Self-pay

## 2018-11-26 ENCOUNTER — Other Ambulatory Visit (HOSPITAL_COMMUNITY): Payer: No Typology Code available for payment source

## 2018-11-26 ENCOUNTER — Encounter (HOSPITAL_COMMUNITY): Payer: Self-pay | Admitting: Psychiatry

## 2018-11-26 ENCOUNTER — Ambulatory Visit (INDEPENDENT_AMBULATORY_CARE_PROVIDER_SITE_OTHER): Payer: No Typology Code available for payment source | Admitting: Psychiatry

## 2018-11-26 ENCOUNTER — Ambulatory Visit (HOSPITAL_COMMUNITY): Payer: No Typology Code available for payment source

## 2018-11-26 DIAGNOSIS — F410 Panic disorder [episodic paroxysmal anxiety] without agoraphobia: Secondary | ICD-10-CM | POA: Diagnosis not present

## 2018-11-26 DIAGNOSIS — F0630 Mood disorder due to known physiological condition, unspecified: Secondary | ICD-10-CM

## 2018-11-26 DIAGNOSIS — F3181 Bipolar II disorder: Secondary | ICD-10-CM | POA: Diagnosis not present

## 2018-11-26 MED ORDER — LAMOTRIGINE 150 MG PO TABS: 150.0000 mg | ORAL_TABLET | Freq: Two times a day (BID) | ORAL | 1 refills | Status: DC

## 2018-11-26 MED ORDER — LORAZEPAM 2 MG PO TABS: 2.0000 mg | ORAL_TABLET | Freq: Two times a day (BID) | ORAL | 1 refills | Status: DC

## 2018-11-26 MED ORDER — FLUOXETINE HCL 20 MG PO CAPS: 20.0000 mg | ORAL_CAPSULE | Freq: Every day | ORAL | 1 refills | Status: DC

## 2018-11-26 MED ORDER — VRAYLAR 1.5 & 3 MG PO CPPK: ORAL_CAPSULE | ORAL | 0 refills | Status: AC

## 2018-11-26 MED FILL — LORazepam 2 MG TABS: 2 | 30 days supply | Qty: 60 | Fill #0

## 2018-11-26 MED FILL — FLUoxetine HCL 20 MG CAPS: 20 | 30 days supply | Qty: 30 | Fill #0

## 2018-11-26 NOTE — Progress Notes (Signed)
Virtual Visit via Telephone Note  I connected with Jill Alexander on 11/26/18 at 11:45 AM EDT by telephone and verified that I am speaking with the correct person using two identifiers.  Location: Patient: home Provider: office   I discussed the limitations, risks, security and privacy concerns of performing an evaluation and management service by telephone and the availability of in person appointments. I also discussed with the patient that there may be a patient responsible charge related to this service. The patient expressed understanding and agreed to proceed.   History of Present Illness: Pt has been feeling very agitated and irritable. Pt continues to have mood swings. Pt is reporting ongoing passive SI without plan or intent. Her mom told her yesterday that the SI was better but pt states that is because her mom is now working day shift so she isn't around to see it. Pt reports she went for a Ketamine consult and was told she is not eligible for Ketamine. Pt feels the only thing helping is Ativan. The increase in Vyalar is not helping at all. Pt states her parents explored a residential treatment center in [place]. but it was not geared towards individuals with autism. They recommended a residential treatment center in [place].  Patient is uncertain about what to do.  She does want to go but will not have her parents there with her.  Also insurance will not cover this day.  They are looking at getting some assistance.  We talked about potentially doing ECT treatments while waiting for this residential treatment to start.  Patient expressed a lot of concern about her memory problems that are still occurring since having ECT over 2 months ago.  Mom states that patient continues to have outbursts and express a lot of of SI.  Mom does feel that patient would benefit from intensive behavioral therapy at this residential treatment center.  It cost $30,000 a month.  They want to send her but  financially cannot afford to send her for long.  They are asking for a letter to support her need for treatment.  It is the hope that insurance will make an exception and cover her treatment.   Observations/Objective: I spoke with Jill Alexander on the phone.  Pt was calm and cooperative.  Pt was engaged in the conversation and answered questions appropriately.  Speech was clear and coherent with slow rate, monotone and normal volume.  Mood is depressed and anxious, affect is congruent. Thought processes are slow and coherent coherent.  Thought content is with ruminations and she often repeated herself.  Patient is endorsing passive SI without plan or intent.  Pt denies HI.   Pt denies auditory and visual hallucinations and did not appear to be responding to internal stimuli.  Memory and concentration are on the poor side.  Fund of knowledge and use of language are average.  Insight and judgment are fair.  I am unable to comment on psychomotor activity, general appearance, hygiene, or eye contact as I was unable to physically see the patient on the phone.  Vital signs not available since interview conducted virtually.    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 for 7 days then decrease to 3mg  for 7 days then decrease to 1.5mg  po for 7 days then stop  Lamictal 150 mg p.o. twice daily  Ativan 2 mg p.o. 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 Pt's mom states pt did well on it as a child.  Ketamine- not eligible   ECT- pt concerned about memory loss but it is willing to consider it if needed  I encouraged her to continue individual therapy (Dr. Patrici RanksMendalton), DBT and supportive employment.  Patient recently completed PHP from 7/1-7/14/2020. She then transitioned to MH-IOP and was  d/c on 11/20/2018.  Overall patient reports no benefit from any of these treatment modalities.  Pt and mom asking for letter to support residential treatment center in [place]. It is geared towards individuals with autism.   Follow Up Instructions: In 5-6 weeks or sooner if needed   I discussed the assessment and treatment plan with the patient. The patient was provided an opportunity to ask questions and all were answered. The patient agreed with the plan and demonstrated an understanding of the instructions.   The patient was advised to call back or seek an in-person evaluation if the symptoms worsen or if the condition fails to improve as anticipated.  I provided 45 minutes of non-face-to-face time during this encounter.   Oletta DarterSalina Demondre Aguas, MD

## 2018-11-27 ENCOUNTER — Other Ambulatory Visit (HOSPITAL_COMMUNITY): Payer: No Typology Code available for payment source

## 2018-11-27 ENCOUNTER — Telehealth: Payer: Self-pay

## 2018-11-27 ENCOUNTER — Ambulatory Visit (HOSPITAL_COMMUNITY): Payer: No Typology Code available for payment source

## 2018-11-27 ENCOUNTER — Encounter (HOSPITAL_COMMUNITY): Payer: No Typology Code available for payment source

## 2018-11-28 ENCOUNTER — Ambulatory Visit (INDEPENDENT_AMBULATORY_CARE_PROVIDER_SITE_OTHER): Payer: No Typology Code available for payment source | Admitting: Clinical

## 2018-11-28 DIAGNOSIS — F840 Autistic disorder: Secondary | ICD-10-CM

## 2018-11-29 ENCOUNTER — Ambulatory Visit: Payer: No Typology Code available for payment source | Admitting: Clinical

## 2018-11-30 ENCOUNTER — Telehealth (HOSPITAL_COMMUNITY): Payer: Self-pay

## 2018-11-30 NOTE — Telephone Encounter (Signed)
Patient is calling about a letter that she said you were going to write, I see one started in Epic, but it is not complete. Please review and advise, thank you

## 2018-12-01 ENCOUNTER — Encounter: Payer: Self-pay | Admitting: Family Medicine

## 2018-12-02 ENCOUNTER — Other Ambulatory Visit: Payer: Self-pay

## 2018-12-02 ENCOUNTER — Ambulatory Visit (INDEPENDENT_AMBULATORY_CARE_PROVIDER_SITE_OTHER): Payer: No Typology Code available for payment source | Admitting: Family Medicine

## 2018-12-02 ENCOUNTER — Encounter: Payer: Self-pay | Admitting: Family Medicine

## 2018-12-02 VITALS — Ht 63.0 in | Wt 142.0 lb

## 2018-12-02 DIAGNOSIS — M79671 Pain in right foot: Secondary | ICD-10-CM

## 2018-12-02 NOTE — Progress Notes (Signed)
   Subjective:  Documentation for virtual telephone encounter.  Documentation for virtual audio and video telecommunications through [employer] encounter:  The patient was located at home. 2 patient identifiers used.  The provider was located in the office. The patient did consent to this visit and is aware of possible charges through their insurance for this visit.  The other persons participating in this telemedicine service were none.    Patient ID: Jill Alexander, female    DOB: 03/30/1996, 23 y.o.   MRN: [id number]  HPI Chief Complaint  Patient presents with  . Foot Pain    right, feels better today    Complains of right foot pain x 2 days. Pain with walking in her arch. No injury or history of similar pain.  Pain resolved completely. No issues now. No other concerns.   Denies fever, chills, arthralgias or myalgias.     Review of Systems Pertinent positives and negatives in the history of present illness.     Objective:   Physical Exam Ht 5\' 3"  (1.6 m)   Wt 142 lb (64.4 kg)   BMI 25.15 kg/m   Alert and oriented in no acute distress.  Foot is normal-appearing.  Denies pain today.      Assessment & Plan:  Foot pain, right - Plan: Unclear etiology for foot pain.  No further exam or treatment needed.  Pain has completely resolved. She will let me know if pain returns.    Time spent on call was 5 minutes and in review of previous records 1 minutes total.  This virtual service is not related to other E/M service within previous 7 days.

## 2018-12-03 ENCOUNTER — Telehealth (HOSPITAL_COMMUNITY): Payer: Self-pay

## 2018-12-03 ENCOUNTER — Encounter (HOSPITAL_COMMUNITY): Payer: Self-pay | Admitting: Psychiatry

## 2018-12-03 NOTE — Telephone Encounter (Signed)
Patient is calling again about a letter. She is very anxious and has been calling everyday since last week. If you have a letter for her could you please fax it to me so that she can pick it up?

## 2018-12-03 NOTE — Telephone Encounter (Signed)
I wrote the letter. You can can fax it.

## 2018-12-03 NOTE — Progress Notes (Signed)
Letter for insurance to get residential treatment

## 2018-12-05 ENCOUNTER — Ambulatory Visit (INDEPENDENT_AMBULATORY_CARE_PROVIDER_SITE_OTHER): Payer: No Typology Code available for payment source | Admitting: Clinical

## 2018-12-05 DIAGNOSIS — F840 Autistic disorder: Secondary | ICD-10-CM | POA: Diagnosis not present

## 2018-12-10 ENCOUNTER — Encounter: Payer: Self-pay | Admitting: Family Medicine

## 2018-12-11 NOTE — Progress Notes (Signed)
    Daily Group Progress Note  Program: IOP  Group Time: 9a-12p  Participation Level: Active  Behavioral Response: Appropriate and Sharing  Type of Therapy:  Group Therapy  Summary of Progress: Writer introduced self and encouraged Patient to complete a check in. Completed an ice breaker to encourage conversation. Discussion of homework assignment that was given yesterday.  Jeanella Craze, [facility] Director was guest speaker during group.  Engaged in Discussion of grief and loss.   In depth discussion of the topic encouraged Patient to share thoughts and feeling of the topic.  Praised Patient for sharing.  Allowed for a brief break.  Writer introduced new topic of "boundaries" from the therapistaid.com website. Completed handout to ensure clarity.  Patient provided feedback to the group.  Writer gave homework and provided with a new coping skill.     Lubertha South, LCSW

## 2018-12-11 NOTE — Progress Notes (Signed)
  Virtual Visit via Telephone Note  I connected with [name] on 11/19/18 at  9:00 AM EDT by telephone and verified that I am speaking with the correct person using two identifiers.  Location: Patient: home Provider: office   I discussed the limitations, risks, security and privacy concerns of performing an evaluation and management service by telephone and the availability of in person appointments. I also discussed with the patient that there may be a patient responsible charge related to this service. The patient expressed understanding and agreed to proceed.   Daily Group Progress Note  Program: IOP  Group Time: 9a-12p  Participation Level: Active  Behavioral Response: Appropriate and Sharing  Type of Therapy:  Group Therapy  Summary of Progress: Writer introduced self and encouraged Patient to complete a check in. Completed an ice breaker to encourage conversation.  Introduced topic of the day "Active Listening."  In depth discussion of the topic encouraged Patient to share thoughts and feeling of the topic.  Praised Patient for sharing.  Allowed for a brief break.  Writer introduced new topic of "Assertive Communication" from the therapistaid.com website. Completed handout to ensure clarity.  Patient provided feedback to the group.  Writer gave homework and provided with a new coping skill.         I discussed the assessment and treatment plan with the patient. The patient was provided an opportunity to ask questions and all were answered. The patient agreed with the plan and demonstrated an understanding of the instructions.   The patient was advised to call back or seek an in-person evaluation if the symptoms worsen or if the condition fails to improve as anticipated.  I provided 180 minutes of face-to-face time during this encounter.   Lubertha South, LCSW

## 2018-12-12 ENCOUNTER — Ambulatory Visit (INDEPENDENT_AMBULATORY_CARE_PROVIDER_SITE_OTHER): Payer: No Typology Code available for payment source | Admitting: Clinical

## 2018-12-12 DIAGNOSIS — F840 Autistic disorder: Secondary | ICD-10-CM | POA: Diagnosis not present

## 2018-12-14 ENCOUNTER — Other Ambulatory Visit: Payer: Self-pay

## 2018-12-14 ENCOUNTER — Ambulatory Visit: Payer: Self-pay | Admitting: Clinical

## 2018-12-14 DIAGNOSIS — Z20822 Contact with and (suspected) exposure to covid-19: Secondary | ICD-10-CM

## 2018-12-15 ENCOUNTER — Encounter: Payer: Self-pay | Admitting: Family Medicine

## 2018-12-16 LAB — SPECIMEN STATUS REPORT

## 2018-12-16 LAB — NOVEL CORONAVIRUS, NAA: SARS-CoV-2, NAA: NOT DETECTED

## 2018-12-18 MED FILL — FLUoxetine HCL 20 MG CAPS: 20 | 30 days supply | Qty: 30 | Fill #1

## 2018-12-18 MED FILL — LORazepam 2 MG TABS: 2 | 30 days supply | Qty: 60 | Fill #1

## 2018-12-20 ENCOUNTER — Ambulatory Visit (INDEPENDENT_AMBULATORY_CARE_PROVIDER_SITE_OTHER): Payer: No Typology Code available for payment source | Admitting: Clinical

## 2018-12-20 DIAGNOSIS — F840 Autistic disorder: Secondary | ICD-10-CM | POA: Diagnosis not present

## 2018-12-21 ENCOUNTER — Encounter: Payer: Self-pay | Admitting: Family Medicine

## 2018-12-26 ENCOUNTER — Ambulatory Visit: Payer: No Typology Code available for payment source | Admitting: Clinical

## 2018-12-28 ENCOUNTER — Ambulatory Visit: Payer: No Typology Code available for payment source | Admitting: Clinical

## 2019-01-02 ENCOUNTER — Ambulatory Visit: Payer: No Typology Code available for payment source | Admitting: Clinical

## 2019-01-09 ENCOUNTER — Ambulatory Visit: Payer: No Typology Code available for payment source | Admitting: Clinical

## 2019-01-14 ENCOUNTER — Ambulatory Visit (HOSPITAL_COMMUNITY): Payer: No Typology Code available for payment source | Admitting: Psychiatry

## 2019-01-16 ENCOUNTER — Ambulatory Visit: Payer: No Typology Code available for payment source | Admitting: Clinical

## 2019-01-29 MED FILL — FLUoxetine HCL 20 MG CAPS: 20 | 30 days supply | Qty: 30 | Fill #0

## 2019-01-29 MED FILL — OMEPRAZOLE DR 40 MG CAPSULE: 40 | 30 days supply | Qty: 60 | Fill #0

## 2019-01-29 MED FILL — LORazepam 2 MG TABS: 2 | 30 days supply | Qty: 60 | Fill #0

## 2019-03-01 ENCOUNTER — Ambulatory Visit (INDEPENDENT_AMBULATORY_CARE_PROVIDER_SITE_OTHER): Payer: No Typology Code available for payment source | Admitting: Clinical

## 2019-03-01 DIAGNOSIS — F840 Autistic disorder: Secondary | ICD-10-CM

## 2019-03-07 ENCOUNTER — Other Ambulatory Visit: Payer: Self-pay | Admitting: Gastroenterology

## 2019-03-08 NOTE — Telephone Encounter (Signed)
Called and spoke to pt.  She didn't feel that she needed an appt but she was informed that Dr. Loni Muse would like to see her on a regular basis if she is still having Sx and he is still prescribing medication for her. She reluctantly scheduled for  December 21 but sounded like she may intend to cancel that if it isn't convenient.  I asked her to call us back as soon as possible to reschedule this appt if she has a conflict. She expressed understanding.

## 2019-03-08 NOTE — Telephone Encounter (Signed)
Pt last seen 04-2018. Cancelled February 2020 appt. She has cancelled 2 gastric emptying scans. Ok to refill Zofran?

## 2019-03-08 NOTE — Telephone Encounter (Signed)
If this helps her that is okay, can refill. I would like to see her for follow up if this is an ongoing issue. Thanks

## 2019-03-10 ENCOUNTER — Ambulatory Visit (INDEPENDENT_AMBULATORY_CARE_PROVIDER_SITE_OTHER): Payer: Self-pay | Admitting: Clinical

## 2019-03-10 DIAGNOSIS — F840 Autistic disorder: Secondary | ICD-10-CM

## 2019-03-10 MED FILL — ARIPiprazole 2 MG TABS: 2 | 30 days supply | Qty: 15 | Fill #0

## 2019-03-11 ENCOUNTER — Ambulatory Visit (INDEPENDENT_AMBULATORY_CARE_PROVIDER_SITE_OTHER): Payer: No Typology Code available for payment source | Admitting: Psychiatry

## 2019-03-11 ENCOUNTER — Encounter (HOSPITAL_COMMUNITY): Payer: Self-pay | Admitting: Psychiatry

## 2019-03-11 ENCOUNTER — Other Ambulatory Visit: Payer: Self-pay

## 2019-03-11 DIAGNOSIS — F410 Panic disorder [episodic paroxysmal anxiety] without agoraphobia: Secondary | ICD-10-CM | POA: Diagnosis not present

## 2019-03-11 DIAGNOSIS — F3181 Bipolar II disorder: Secondary | ICD-10-CM

## 2019-03-11 MED ORDER — LAMOTRIGINE 200 MG PO TABS: 200.0000 mg | ORAL_TABLET | Freq: Two times a day (BID) | ORAL | 0 refills | Status: DC

## 2019-03-11 MED ORDER — LORAZEPAM 2 MG PO TABS: 2.0000 mg | ORAL_TABLET | Freq: Two times a day (BID) | ORAL | 1 refills | Status: DC

## 2019-03-11 MED ORDER — ARIPIPRAZOLE 2 MG PO TABS: 1.0000 mg | ORAL_TABLET | Freq: Every day | ORAL | 0 refills | Status: DC

## 2019-03-11 MED ORDER — NORTRIPTYLINE HCL 10 MG PO CAPS: ORAL_CAPSULE | ORAL | 0 refills | Status: DC

## 2019-03-11 MED ORDER — FLUOXETINE HCL 40 MG PO CAPS: 40.0000 mg | ORAL_CAPSULE | Freq: Every day | ORAL | 0 refills | Status: DC

## 2019-03-11 NOTE — Progress Notes (Signed)
Virtual Visit via Telephone Note  I connected with Jill Alexander  on 03/11/19 at  3:00 PM EST by telephone and verified that I am speaking with the correct person using two identifiers.  Location: Patient: home Provider: office   I discussed the limitations, risks, security and privacy concerns of performing an evaluation and management service by telephone and the availability of in person appointments. I also discussed with the patient that there may be a patient responsible charge related to this service. The patient expressed understanding and agreed to proceed.   History of Present Illness: Pt was treated at a residential treatment program from August 24- October 31. Pt states all the staff told her she made improvements but pt did not see it at the time of discharge. She lived at the facility and did not go out at all. Now that she is at home she has noticed that she is able to use her WRAP plan to manage some of her "episodes" better. Her anxiety is much higher than usual when she goes out. She attributes this to "living in a bubble for 2.5 months". At home she still has anxiety and it can be overwhelming. Jill Alexander gave the example of not being able to eat. She is trying to challenge herself by going to work with her dad. She went yesterday and it was hard. Her depression is ongoing. Jill Alexander shares that her mood swings and SI got worse after coming home from the program. The SI occurs most days of the week. She has intention of committing suicide. It helps her to be busy and she is actively trying to do so. The Ativan helps a lot to control her irritability, agitation and mood swings.     Observations/Objective: I spoke with Jill Alexander on the phone.  Pt was calm, pleasant and cooperative.  Pt was engaged in the conversation and answered questions appropriately.  Speech was clear and coherent with slow rate, monotone and normal volume.  Mood is depressed and anxious, affect is  congruent. Thought processes are coherent and circumstantial.  Thought content is logical. Pt reports ongoing SI without plan or intent. Pt denies HI.   Pt denies auditory and visual hallucinations and did not appear to be responding to internal stimuli.  Memory and concentration are good.  Fund of knowledge and use of language are average.  Insight and judgment are fair.  I am unable to comment on psychomotor activity, general appearance, hygiene, or eye contact as I was unable to physically see the patient on the phone.  Vital signs not available since interview conducted virtually.    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 twice daily- increased at treatment program   Ativan 2 mg p.o. twice daily as needed anxiety or mood swings   Increase Prozac 40mg  po qD for MDD.   Nortriptyline 10-20mg  po qHS for insomnia   Ketamine- not eligible    ECT- did not find much benefit   I encouraged her to continue individual therapy (Dr. ), DBT and supportive employment.  Patient recently completed PHP from 7/1-7/14/2020. She then transitioned to [facility] and was d/c on 11/20/2018.  Overall patient reports no benefit from any  of these treatment modalities.     Follow Up Instructions: In 8 weeks or sooner if needed   I discussed the assessment and treatment plan with the patient. The patient was provided an opportunity to ask questions and all were answered. The patient agreed with the plan and demonstrated an understanding of the instructions.   The patient was advised to call back or seek an in-person evaluation if the symptoms worsen or if the condition  fails to improve as anticipated.  I provided 40 minutes of non-face-to-face time during this encounter.   Charlcie Cradle, MD

## 2019-03-16 ENCOUNTER — Ambulatory Visit (INDEPENDENT_AMBULATORY_CARE_PROVIDER_SITE_OTHER): Payer: No Typology Code available for payment source | Admitting: Clinical

## 2019-03-16 DIAGNOSIS — F840 Autistic disorder: Secondary | ICD-10-CM

## 2019-03-22 ENCOUNTER — Ambulatory Visit (INDEPENDENT_AMBULATORY_CARE_PROVIDER_SITE_OTHER): Payer: No Typology Code available for payment source | Admitting: Clinical

## 2019-03-22 DIAGNOSIS — F840 Autistic disorder: Secondary | ICD-10-CM

## 2019-03-23 ENCOUNTER — Ambulatory Visit: Payer: No Typology Code available for payment source | Admitting: Clinical

## 2019-03-24 ENCOUNTER — Ambulatory Visit: Payer: No Typology Code available for payment source | Admitting: Clinical

## 2019-03-24 ENCOUNTER — Other Ambulatory Visit (HOSPITAL_COMMUNITY): Payer: Self-pay | Admitting: *Deleted

## 2019-03-24 ENCOUNTER — Ambulatory Visit (INDEPENDENT_AMBULATORY_CARE_PROVIDER_SITE_OTHER): Payer: No Typology Code available for payment source | Admitting: Clinical

## 2019-03-24 DIAGNOSIS — F840 Autistic disorder: Secondary | ICD-10-CM | POA: Diagnosis not present

## 2019-03-24 DIAGNOSIS — F3181 Bipolar II disorder: Secondary | ICD-10-CM

## 2019-03-24 MED FILL — lamoTRIgine 200 MG TABS: 200 | 30 days supply | Qty: 60 | Fill #0

## 2019-03-25 ENCOUNTER — Ambulatory Visit: Payer: No Typology Code available for payment source | Admitting: Clinical

## 2019-03-30 ENCOUNTER — Ambulatory Visit (INDEPENDENT_AMBULATORY_CARE_PROVIDER_SITE_OTHER): Payer: No Typology Code available for payment source | Admitting: Clinical

## 2019-03-30 ENCOUNTER — Ambulatory Visit: Payer: No Typology Code available for payment source | Admitting: Clinical

## 2019-03-30 DIAGNOSIS — F840 Autistic disorder: Secondary | ICD-10-CM

## 2019-03-31 ENCOUNTER — Ambulatory Visit: Payer: No Typology Code available for payment source | Admitting: Clinical

## 2019-04-06 ENCOUNTER — Ambulatory Visit: Payer: No Typology Code available for payment source | Admitting: Clinical

## 2019-04-08 ENCOUNTER — Other Ambulatory Visit: Payer: Self-pay

## 2019-04-08 ENCOUNTER — Ambulatory Visit (INDEPENDENT_AMBULATORY_CARE_PROVIDER_SITE_OTHER): Payer: No Typology Code available for payment source | Admitting: Psychiatry

## 2019-04-08 ENCOUNTER — Encounter (HOSPITAL_COMMUNITY): Payer: Self-pay | Admitting: Psychiatry

## 2019-04-08 DIAGNOSIS — R4589 Other symptoms and signs involving emotional state: Secondary | ICD-10-CM

## 2019-04-08 DIAGNOSIS — F840 Autistic disorder: Secondary | ICD-10-CM | POA: Diagnosis not present

## 2019-04-08 DIAGNOSIS — F332 Major depressive disorder, recurrent severe without psychotic features: Secondary | ICD-10-CM | POA: Diagnosis not present

## 2019-04-08 DIAGNOSIS — F0630 Mood disorder due to known physiological condition, unspecified: Secondary | ICD-10-CM

## 2019-04-08 MED ORDER — FLUOXETINE HCL 20 MG PO CAPS: 60.0000 mg | ORAL_CAPSULE | Freq: Every day | ORAL | 3 refills | Status: DC

## 2019-04-08 NOTE — Progress Notes (Signed)
This note is not being shared with the patient for the following reason: To prevent harm (release of this note would result in harm to the life or physical safety of the patient or another).   Virtual Visit via Telephone Note  I connected with Jill Alexander  on 04/08/19 at  8:00 AM EST by telephone and verified that I am speaking with the correct person using two identifiers. Pt declined a video visit today.   Location: Patient: home Provider: office   I discussed the limitations, risks, security and privacy concerns of performing an evaluation and management service by telephone and the availability of in person appointments. I also discussed with the patient that there may be a patient responsible charge related to this service. The patient expressed understanding and agreed to proceed.   History of Present Illness: "I'm ok." It was recommended that Jill Alexander follow up with a program called "[facility]" who serve individuals on the spectrum per Jill Alexander. She contacted them and they are asking about employment. 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 Due the hours of the [facility] program M-F 6-8 hrs/day she and her parents think it would be better to focus on the program rather than work. She has been attending but finds that she can not always tolerate it. She has left early several times. Her mood is "not that great". For the last 2 weeks her agitation and SI have worsened. The is unable to identify a trigger. [name] note that she feels depressed after eating and sometimes before she starts eating. It doesn't seem to be affected by the type of food she is eating. She has distressing SIB or SI. They are thoughts but without plan or intent. She will cry and scream. The episodes can last 30 min to an hour or longer. She takes Ativan right before eating but it helps more when she is upset rather than to prevent an episode.  [name] takes Nortriptyline 20mg  and Melatonin  and sleeps well enough. She does not usually takes naps during the day. 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 Her mom thinks patient needs an increase in Prozac. Pt got a puppy last week and really loves it.     Observations/Objective: I spoke with Jill Alexander on the phone.  Pt was calm, pleasant and cooperative.  Pt was engaged in the conversation and answered questions appropriately.  Speech was clear and coherent with normal rate, monotone and volume.  Mood is depressed and anxious, affect is congruent. Thought processes are coherent and circumstantial.  Thought content is logical.  Pt denies HI.  Jill Alexander reports on and off SI without plan or intent pt denies auditory and visual hallucinations and did not appear to be responding to internal stimuli.  Memory and concentration are good.  Fund of knowledge and use of language are average.  Insight and judgment are fair.  I am unable to comment on psychomotor activity, general appearance, hygiene, or eye contact as I was unable to physically see the patient on the phone.  Vital signs not available since interview conducted virtually.     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 twice daily- increased at treatment program   Ativan 2 mg p.o. twice daily as needed anxiety or mood swings   Increase  Prozac 60mg  po qD for MDD. Discussed Serotonin syndrome in detail along with plan.   Nortriptyline 10-20mg  po qHS for insomnia   Ketamine- not eligible    ECT- did not find much benefit   She is currently attending [facility]. I encouraged her to  continue individual therapy ([facility]. [facility]) weekly, DBT and supportive employment.  Pt received treatment at a residental treatment facility from Aug 24 Feb 27, 2019.  Patient completed PHP from 7/1-7/14/2020. She then transitioned to MH-IOP and was d/c on 11/20/2018.  Overall patient reports no benefit from any of these treatment modalities.     Follow Up Instructions: In 6 weeks or sooner if needed   I discussed the assessment and treatment plan with the patient. The patient was provided an opportunity to ask questions and all were answered. The patient agreed with the plan and demonstrated an understanding of the instructions.   The patient was advised to call back or seek an in-person evaluation if the symptoms worsen or if the condition fails to improve as anticipated.  I provided 45 minutes of non-face-to-face time during this encounter.   11/22/2018, MD

## 2019-04-10 ENCOUNTER — Ambulatory Visit (INDEPENDENT_AMBULATORY_CARE_PROVIDER_SITE_OTHER): Payer: No Typology Code available for payment source | Admitting: Clinical

## 2019-04-10 DIAGNOSIS — F840 Autistic disorder: Secondary | ICD-10-CM | POA: Diagnosis not present

## 2019-04-13 ENCOUNTER — Ambulatory Visit: Payer: No Typology Code available for payment source | Admitting: Clinical

## 2019-04-15 ENCOUNTER — Encounter: Payer: Self-pay | Admitting: Family Medicine

## 2019-04-15 ENCOUNTER — Other Ambulatory Visit: Payer: Self-pay | Admitting: *Deleted

## 2019-04-15 DIAGNOSIS — N926 Irregular menstruation, unspecified: Secondary | ICD-10-CM

## 2019-04-15 MED ORDER — NORETHIN ACE-ETH ESTRAD-FE 1-20 MG-MCG(24) PO CAPS: 1.0000 | ORAL_CAPSULE | Freq: Every day | ORAL | 1 refills | Status: DC

## 2019-04-15 NOTE — Telephone Encounter (Signed)
Received fax for refill for Taytulla 1mg -20 mcg capsule. Per chart is a [facility] patient . Will forward to that office and provider.  Zsazsa Bahena,RN

## 2019-04-15 NOTE — Addendum Note (Signed)
Addended by: Derl Barrow on: 04/15/2019 02:20 PM   Modules accepted: Orders

## 2019-04-16 ENCOUNTER — Other Ambulatory Visit (HOSPITAL_COMMUNITY): Payer: Self-pay | Admitting: *Deleted

## 2019-04-16 DIAGNOSIS — F3181 Bipolar II disorder: Secondary | ICD-10-CM

## 2019-04-16 MED ORDER — ARIPIPRAZOLE 2 MG PO TABS: 1.0000 mg | ORAL_TABLET | Freq: Every day | ORAL | 0 refills | Status: DC

## 2019-04-16 MED FILL — ARIPiprazole 2 MG TABS: 2 | 90 days supply | Qty: 45 | Fill #0

## 2019-04-19 ENCOUNTER — Ambulatory Visit (INDEPENDENT_AMBULATORY_CARE_PROVIDER_SITE_OTHER): Payer: No Typology Code available for payment source | Admitting: Gastroenterology

## 2019-04-19 ENCOUNTER — Encounter: Payer: Self-pay | Admitting: Gastroenterology

## 2019-04-19 ENCOUNTER — Telehealth: Payer: Self-pay | Admitting: General Practice

## 2019-04-19 VITALS — BP 106/58 | HR 84 | Temp 98.7°F | Ht 63.0 in | Wt 143.8 lb

## 2019-04-19 DIAGNOSIS — K219 Gastro-esophageal reflux disease without esophagitis: Secondary | ICD-10-CM | POA: Diagnosis not present

## 2019-04-19 DIAGNOSIS — R110 Nausea: Secondary | ICD-10-CM

## 2019-04-19 DIAGNOSIS — R1013 Epigastric pain: Secondary | ICD-10-CM

## 2019-04-19 MED ORDER — METOCLOPRAMIDE HCL 5 MG PO TABS: 5.0000 mg | ORAL_TABLET | Freq: Three times a day (TID) | ORAL | 1 refills | Status: DC | PRN

## 2019-04-19 MED ORDER — ONDANSETRON 8 MG PO TBDP: 8.0000 mg | ORAL_TABLET | Freq: Four times a day (QID) | ORAL | 1 refills | Status: DC | PRN

## 2019-04-19 NOTE — Telephone Encounter (Signed)
Called patient to schedule annual exam in order to get birth control pills.  Pt stated that she wanted to wait for her annual exam and that she was able to get birth control pills.

## 2019-04-19 NOTE — Telephone Encounter (Signed)
-----   Message from Derl Barrow, RN sent at 04/15/2019  2:18 PM EST ----- Please call patient to schedule an annual exam. She will need annual for more birth control pills.  Derl Barrow, RN

## 2019-04-19 NOTE — Patient Instructions (Addendum)
If you are age 25 or older, your body mass index should be between 23-30. Your Body mass index is 25.47 kg/m. If this is out of the aforementioned range listed, please consider follow up with your Primary Care Provider.  If you are age 67 or younger, your body mass index should be between 19-25. Your Body mass index is 25.47 kg/m. If this is out of the aformentioned range listed, please consider follow up with your Primary Care Provider.   We have sent the following medications to your pharmacy for you to pick up at your convenience:   Zofran every 6-8 as needed.   Reglan 5 mg three times daily as needed.

## 2019-04-19 NOTE — Progress Notes (Signed)
HPI :  23 year old female with a history of autism, depression, seizure disorder, chronic nausea and dyspepsia, here for follow-up visit.  We have previously evaluated her with EGD, negative celiac serologies, negative testing for H. Pylori.  She has been on omeprazole historically.  At her last visit we discussed performing a gastric emptying study in light of some of her postprandial symptoms, however she ultimately declined to have that done.  She has been taking Zofran on a more scheduled basis as opposed to as needed, she states she likes taking it more so as needed as she is not always nauseated and it tends to work better for her when she takes it as needed. She continues to have nausea periodically this can be both sporadic and occasionally postprandial.  She has reflux that bothers her intermittently, she wonders if it is related to her emotions or sometimes worsens with anxiety.  She is not vomiting.  Her weight is stable.  She continues to have occasional dyspepsia and early satiety.  She has been on Lamictal for very long time we have discussed in the past that this can be associated with some GI upset at times.  She has been on Pamelor for headaches and insomnia since September, she takes this as a low-dose 10 mg nightly and tends to tolerate it so far.  She denies any marijuana use.  She has not tried Reglan in the past for the symptoms.   EGD 04/20/2018 - normal esophagus, biopsies taken - no evidence of EoE, normal stomach - biopsies show no H pylori, normal duodenum - biopsies show no celiac   Past Medical History:  Diagnosis Date  . Anxiety   . Autism   . Chronic nausea   . Depression   . Dyspepsia   . GERD (gastroesophageal reflux disease)   . Heart murmur   . Seizures (HCC)   . 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 none     Family History  Problem Relation Age of Onset  . Depression Father   . Anxiety  disorder Sister   . Bipolar disorder Sister   . Diabetes Maternal Grandfather   . Cancer Maternal Grandfather   . Breast cancer Paternal Grandmother   . Uterine cancer Paternal Grandmother   . Irritable bowel syndrome Paternal Grandmother   . Colon cancer Neg Hx   . Esophageal cancer Neg Hx   . Stomach cancer Neg Hx   . Rectal cancer Neg Hx    Social History   Tobacco Use  . Smoking status: Never Smoker  . Smokeless tobacco: Never Used  Substance Use Topics  . Alcohol use: No  . Drug use: No   Current Outpatient Medications  Medication Sig Dispense Refill  . acetaminophen (TYLENOL) 500 MG tablet Take 500 mg by mouth every 6 (six) hours as needed.    . ARIPiprazole (ABILIFY) 2 MG tablet Take 0.5 tablets (1 mg total) by mouth daily. 45 tablet 0  . FLUoxetine (PROZAC) 20 MG capsule Take 3 capsules (60 mg total) by mouth daily. 270 capsule 3  . Ibuprofen (MOTRIN PO) Take 200 mg by mouth as needed.     . lamoTRIgine (LAMICTAL) 200 MG tablet Take 1 tablet (200 mg total) by mouth 2 (two) times daily. 180 tablet 0  . LORazepam (ATIVAN) 2 MG tablet Take 1 tablet (2 mg total) by mouth 2 (two) times daily. 60 tablet 1  . Melatonin 1  MG TABS Take 1 mg by mouth at bedtime as needed (insomnia).    . Multiple Vitamins-Minerals (MULTIVITAMIN PO) Take 1 tablet by mouth daily.    . Norethin Ace-Eth Estrad-FE (TAYTULLA) 1-20 MG-MCG(24) CAPS Take 1 tablet by mouth daily. 28 capsule 1  . nortriptyline (PAMELOR) 10 MG capsule Take 1-2 tabs po qHS (Patient taking differently: Take 2 tabs po qHS) 180 capsule 0  . omeprazole (PRILOSEC) 40 MG capsule TAKE 1 CAPSULE BY MOUTH TWICE A DAY 60 capsule 3  . ondansetron (ZOFRAN-ODT) 8 MG disintegrating tablet Take 1 tablet (8 mg total) by mouth every 6 (six) hours as needed for nausea or vomiting. 60 tablet 1  . polyethylene glycol (MIRALAX) packet Take 17 g by mouth daily as needed. 14 each 0  . metoCLOPramide (REGLAN) 5 MG tablet Take 1 tablet (5 mg total) by  mouth 3 (three) times daily as needed for nausea. 90 tablet 1   No current facility-administered medications for this visit.   Allergies  Allergen Reactions  . Gluten Meal      Review of Systems: All systems reviewed and negative except where noted in HPI.   Lab Results  Component Value Date   WBC 8.5 07/01/2018   HGB 14.0 07/01/2018   HCT 43.9 07/01/2018   MCV 94.4 07/01/2018   PLT 300 07/01/2018   Lab Results  Component Value Date   CREATININE 0.61 07/01/2018   BUN 8 07/01/2018   NA 141 07/01/2018   K 3.8 07/01/2018   CL 105 07/01/2018   CO2 25 07/01/2018    Lab Results  Component Value Date   ALT 15 01/05/2018   AST 22 01/05/2018   ALKPHOS 36 (L) 01/05/2018   BILITOT 0.3 01/05/2018      Physical Exam: BP (!) 106/58   Pulse 84   Temp 98.7 F (37.1 C)   Ht 5\' 3"  (1.6 m)   Wt 143 lb 12.8 oz (65.2 kg)   BMI 25.47 kg/m  Constitutional: Pleasant,well-developed,female in no acute distress. HEENT: Normocephalic and atraumatic. Conjunctivae are normal. No scleral icterus. Neck supple.  Cardiovascular: Normal rate, regular rhythm.  Pulmonary/chest: Effort normal and breath sounds normal. No wheezing, rales or rhonchi. Abdominal: Soft, nondistended, nontender. There are no masses palpable.  Extremities: no edema Lymphadenopathy: No cervical adenopathy noted. Neurological: Alert and oriented to person place and time. Skin: Skin is warm and dry. No rashes noted. Psychiatric: Normal mood and affect. Behavior is normal.   ASSESSMENT AND PLAN: 23 y/o female here for reassessment of the following:  Chronic nausea / Dyspepsia / GERD - EGD was unremarkable. She continues to have periodic nausea with occasional postprandial early satiety and dyspepsia. Not much vomiting recently. We have previously discussed options, were going to proceed with a GES to rule out gastroparesis but she did not want to have that study done. We discussed options again. I offered her an  empiric trial of low dose Reglan to see if that will help at all. Discussed risks of tardive dyskinesia which is very rare, especially at low doses, will start 5mg  up to three times per day as needed. Otherwise, medication side effect from Lamictal also remains possible (can cause chronic nausea in 14% of patients on this drug). I will reach out to her psychiatrist to get their thoughts about an alternative regimen in case this may be related. She will switch her Zofran from scheduled to PRN and continue PPI for now. She agreed with this. I asked her to  update me on how she is doing in the next few weeks.   Ileene PatrickSteven Venia Riveron, MD [facility]

## 2019-04-20 ENCOUNTER — Ambulatory Visit (INDEPENDENT_AMBULATORY_CARE_PROVIDER_SITE_OTHER): Payer: No Typology Code available for payment source | Admitting: Clinical

## 2019-04-20 DIAGNOSIS — F840 Autistic disorder: Secondary | ICD-10-CM

## 2019-04-21 MED FILL — lamoTRIgine 200 MG TABS: 200 | 30 days supply | Qty: 60 | Fill #1

## 2019-04-21 MED FILL — OMEPRAZOLE 40 MG CPDR: 40 | 30 days supply | Qty: 60 | Fill #1

## 2019-04-27 ENCOUNTER — Ambulatory Visit: Payer: No Typology Code available for payment source | Admitting: Clinical

## 2019-05-04 ENCOUNTER — Ambulatory Visit (INDEPENDENT_AMBULATORY_CARE_PROVIDER_SITE_OTHER): Payer: No Typology Code available for payment source | Admitting: Clinical

## 2019-05-04 ENCOUNTER — Encounter: Payer: Self-pay | Admitting: Family Medicine

## 2019-05-04 DIAGNOSIS — F840 Autistic disorder: Secondary | ICD-10-CM

## 2019-05-06 ENCOUNTER — Ambulatory Visit: Payer: No Typology Code available for payment source | Admitting: Family Medicine

## 2019-05-06 ENCOUNTER — Other Ambulatory Visit: Payer: Self-pay

## 2019-05-11 ENCOUNTER — Ambulatory Visit (INDEPENDENT_AMBULATORY_CARE_PROVIDER_SITE_OTHER): Payer: No Typology Code available for payment source | Admitting: Clinical

## 2019-05-11 DIAGNOSIS — F840 Autistic disorder: Secondary | ICD-10-CM | POA: Diagnosis not present

## 2019-05-12 ENCOUNTER — Ambulatory Visit (INDEPENDENT_AMBULATORY_CARE_PROVIDER_SITE_OTHER): Payer: No Typology Code available for payment source | Admitting: Family Medicine

## 2019-05-12 ENCOUNTER — Other Ambulatory Visit: Payer: Self-pay

## 2019-05-12 ENCOUNTER — Encounter: Payer: Self-pay | Admitting: Family Medicine

## 2019-05-12 VITALS — Wt 145.0 lb

## 2019-05-12 DIAGNOSIS — R519 Headache, unspecified: Secondary | ICD-10-CM | POA: Diagnosis not present

## 2019-05-12 DIAGNOSIS — F840 Autistic disorder: Secondary | ICD-10-CM | POA: Diagnosis not present

## 2019-05-12 DIAGNOSIS — F0630 Mood disorder due to known physiological condition, unspecified: Secondary | ICD-10-CM

## 2019-05-12 DIAGNOSIS — F3181 Bipolar II disorder: Secondary | ICD-10-CM

## 2019-05-12 DIAGNOSIS — N939 Abnormal uterine and vaginal bleeding, unspecified: Secondary | ICD-10-CM

## 2019-05-12 DIAGNOSIS — R5383 Other fatigue: Secondary | ICD-10-CM

## 2019-05-12 NOTE — Progress Notes (Signed)
Subjective:  Documentation for virtual audio and video telecommunications through [facility] encounter:  The patient was located at home. 2 patient identifiers used.  The provider was located in the office. The patient did consent to this visit and is aware of possible charges through their insurance for this visit.  The other persons participating in this telemedicine service were none.    Patient ID: Jill Alexander, female    DOB: 12-Jan-1996, 24 y.o.   MRN: [id number]  HPI Chief Complaint  Patient presents with  . headaches    headaches feels like migraines x 1 months. psych doctor says she should do labs- possibly due to mood swings,  usually don't have periods but thinks she has skipped a couple pills and now having period with cramping.   She is a 24 year old female with a history of bipolar disorder, autism spectrum disorder, anxiety, depression, seizures and headaches.   Complains of her usual headaches but they are becoming more frequent. Headaches are mainly frontal and occurs 3-4 times per week. Headaches last a day usually.  No obvious aura. She reports some nausea recently but this may be associated with her menstrual cycle as well as her headache. Denies vomiting or diarrhea.  No numbness or focal weakness.   States she occasionally sees "spots" with her headaches.  Takes Tylenol and/or Advil for headaches and this usually helps.   She thinks nortriptyline was started for her headaches and her mood. This was in [place] her psychiatrist would like for her to have labs due to her mood swings.   Reports abnormal vaginal bleeding and thinks this may be due to not taking her birth control pills consisently.   She also reports having some fatigue, not new but still bothersome.   History of seizures but states no seizure since 2015.   Reports history of memory issues and her memory is currently at baseline per patient. History of ECT   Denies fever,  chills, chest pain, palpitations, shortness of breath, cough.  Reviewed allergies, medications, past medical, surgical, family, and social history.   Review of Systems Pertinent positives and negatives in the history of present illness.     Objective:   Physical Exam Wt 145 lb (65.8 kg)   LMP 05/10/2019   BMI 25.69 kg/m   Alert and oriented and distress.  Unable to examine further due to this being a virtual visit      Assessment & Plan:  Intermittent headache -Reports her usual headaches but they are becoming more frequent and bothersome.  She is on several medications including nortriptyline which she believes was started for her headaches.  She has been followed by [facility] neurology for seizures and is not sure if she is ever seen them for her headaches.  I do recommend that she call and schedule a visit  Mood disorder in conditions classified elsewhere -Continue seeing psychiatrist who is treating her for mood issues.  States she needs labs per psychiatry so she will come into the office to get these at her convenience.  Bipolar 2 disorder (HCC)-she is on several medications and closely followed by psychiatry  Autism spectrum disorder  Fatigue, unspecified type - Plan: CBC with Differential, Comprehensive metabolic panel, TSH, T4, Free -This is not new.  Consider underlying physiological etiologies, check labs and follow-up.  Abnormal uterine bleeding (AUB) - Plan: TSH, T4, Free, hCG, quantitative, pregnancy -This is most likely due to missing doses of her birth control pills.  Encouraged her to be more consistent and to set an alarm if needed to remind her to take her birth control. check labs and follow-up   Time spent on call was 22 minutes and in review of previous records 30 minutes total.  This virtual service is not related to other E/M service within previous 7 days.

## 2019-05-13 ENCOUNTER — Other Ambulatory Visit: Payer: No Typology Code available for payment source

## 2019-05-13 ENCOUNTER — Other Ambulatory Visit: Payer: Self-pay

## 2019-05-13 DIAGNOSIS — R5383 Other fatigue: Secondary | ICD-10-CM

## 2019-05-13 DIAGNOSIS — N939 Abnormal uterine and vaginal bleeding, unspecified: Secondary | ICD-10-CM

## 2019-05-14 ENCOUNTER — Telehealth: Payer: Self-pay

## 2019-05-14 DIAGNOSIS — R519 Headache, unspecified: Secondary | ICD-10-CM

## 2019-05-14 LAB — COMPREHENSIVE METABOLIC PANEL
ALT: 16 IU/L (ref 0–32)
AST: 20 IU/L (ref 0–40)
Albumin/Globulin Ratio: 2.1 (ref 1.2–2.2)
Albumin: 4.4 g/dL (ref 3.9–5.0)
Alkaline Phosphatase: 59 IU/L (ref 39–117)
BUN/Creatinine Ratio: 16 (ref 9–23)
BUN: 11 mg/dL (ref 6–20)
Bilirubin Total: 0.2 mg/dL (ref 0.0–1.2)
CO2: 22 mmol/L (ref 20–29)
Calcium: 9.4 mg/dL (ref 8.7–10.2)
Chloride: 106 mmol/L (ref 96–106)
Creatinine, Ser: 0.68 mg/dL (ref 0.57–1.00)
GFR calc Af Amer: 141 mL/min/{1.73_m2} (ref >59)
GFR calc non Af Amer: 122 mL/min/{1.73_m2} (ref >59)
Globulin, Total: 2.1 g/dL (ref 1.5–4.5)
Glucose: 87 mg/dL (ref 65–99)
Potassium: 4.6 mmol/L (ref 3.5–5.2)
Sodium: 142 mmol/L (ref 134–144)
Total Protein: 6.5 g/dL (ref 6.0–8.5)

## 2019-05-14 LAB — CBC WITH DIFFERENTIAL/PLATELET
Basophils Absolute: 0 10*3/uL (ref 0.0–0.2)
Basos: 0 %
EOS (ABSOLUTE): 0 10*3/uL (ref 0.0–0.4)
Eos: 0 %
Hematocrit: 40.2 % (ref 34.0–46.6)
Hemoglobin: 13.2 g/dL (ref 11.1–15.9)
Immature Grans (Abs): 0 10*3/uL (ref 0.0–0.1)
Immature Granulocytes: 0 %
Lymphocytes Absolute: 1.4 10*3/uL (ref 0.7–3.1)
Lymphs: 27 %
MCH: 30.1 pg (ref 26.6–33.0)
MCHC: 32.8 g/dL (ref 31.5–35.7)
MCV: 92 fL (ref 79–97)
Monocytes Absolute: 0.5 10*3/uL (ref 0.1–0.9)
Monocytes: 9 %
Neutrophils Absolute: 3.2 10*3/uL (ref 1.4–7.0)
Neutrophils: 64 %
Platelets: 269 10*3/uL (ref 150–450)
RBC: 4.38 x10E6/uL (ref 3.77–5.28)
RDW: 12.4 % (ref 11.7–15.4)
WBC: 5.1 10*3/uL (ref 3.4–10.8)

## 2019-05-14 LAB — BETA HCG QUANT (REF LAB): hCG Quant: <1 m[IU]/mL

## 2019-05-14 LAB — TSH: TSH: 0.65 u[IU]/mL (ref 0.450–4.500)

## 2019-05-14 LAB — T4, FREE: Free T4: 1.26 ng/dL (ref 0.82–1.77)

## 2019-05-14 NOTE — Telephone Encounter (Signed)
done

## 2019-05-14 NOTE — Telephone Encounter (Signed)
Please advise if referral is ok 

## 2019-05-14 NOTE — Telephone Encounter (Signed)
ok 

## 2019-05-14 NOTE — Telephone Encounter (Signed)
Pt. Called stating that she was recently seen by Vickie and she suggested that she see [facility] for head aches, she called to get an apt. And they told her she would still need a referral to be seen there.

## 2019-05-17 ENCOUNTER — Ambulatory Visit (INDEPENDENT_AMBULATORY_CARE_PROVIDER_SITE_OTHER): Payer: No Typology Code available for payment source | Admitting: Clinical

## 2019-05-17 DIAGNOSIS — F840 Autistic disorder: Secondary | ICD-10-CM

## 2019-05-18 ENCOUNTER — Ambulatory Visit: Payer: No Typology Code available for payment source | Admitting: Clinical

## 2019-05-20 ENCOUNTER — Ambulatory Visit (HOSPITAL_COMMUNITY): Payer: No Typology Code available for payment source | Admitting: Psychiatry

## 2019-05-20 ENCOUNTER — Other Ambulatory Visit: Payer: Self-pay

## 2019-05-20 DIAGNOSIS — F410 Panic disorder [episodic paroxysmal anxiety] without agoraphobia: Secondary | ICD-10-CM

## 2019-05-20 DIAGNOSIS — F3181 Bipolar II disorder: Secondary | ICD-10-CM

## 2019-05-20 DIAGNOSIS — F0630 Mood disorder due to known physiological condition, unspecified: Secondary | ICD-10-CM

## 2019-05-20 MED FILL — lamoTRIgine 200 MG TABS: 200 | 30 days supply | Qty: 60 | Fill #2

## 2019-05-21 ENCOUNTER — Encounter (HOSPITAL_COMMUNITY): Payer: Self-pay | Admitting: Psychiatry

## 2019-05-21 MED ORDER — ARIPIPRAZOLE 2 MG PO TABS: 1.0000 mg | ORAL_TABLET | Freq: Every day | ORAL | 0 refills | Status: DC

## 2019-05-21 MED ORDER — LORAZEPAM 2 MG PO TABS: 2.0000 mg | ORAL_TABLET | Freq: Two times a day (BID) | ORAL | 1 refills | Status: DC

## 2019-05-21 MED ORDER — FLUOXETINE HCL 20 MG PO CAPS: 60.0000 mg | ORAL_CAPSULE | Freq: Every day | ORAL | 3 refills | Status: DC

## 2019-05-21 MED ORDER — LAMOTRIGINE 200 MG PO TABS: 200.0000 mg | ORAL_TABLET | Freq: Two times a day (BID) | ORAL | 0 refills | Status: DC

## 2019-05-21 MED ORDER — NORTRIPTYLINE HCL 10 MG PO CAPS: 30.0000 mg | ORAL_CAPSULE | Freq: Every day | ORAL | 0 refills | Status: DC

## 2019-05-21 NOTE — Progress Notes (Unsigned)
Virtual Visit via Telephone Note  I connected with Jill Alexander on 05/20/19 at  3:30 PM EST by telephone and verified that I am speaking with the correct person using two identifiers.  Location: Patient: home Provider: office   I discussed the limitations, risks, security and privacy concerns of performing an evaluation and management service by telephone and the availability of in person appointments. I also discussed with the patient that there may be a patient responsible charge related to this service. The patient expressed understanding and agreed to proceed.   History of Present Illness: ***   Observations/Objective:  General Appearance: {Appearance:22683}  Eye Contact:  {BHH EYE CONTACT:22684}  Speech:  {Speech:22685}  Volume:  {Volume (PAA):22686}  Mood:  {BHH MOOD:22306}  Affect:  {Affect (PAA):22687}  Thought Process:  {Thought Process (PAA):22688}  Orientation:  {BHH ORIENTATION (PAA):22689}  Thought Content:  {Thought Content:22690}  Suicidal Thoughts:  {ST/HT (PAA):22692}  Homicidal Thoughts:  {ST/HT (PAA):22692}  Memory:  {BHH MEMORY:22881}  Judgement:  {Judgement (PAA):22694}  Insight:  {Insight (PAA):22695}  Psychomotor Activity:  {Psychomotor (PAA):22696}  Concentration:  {Concentration:21399}  Recall:  {BHH GOOD/FAIR/POOR:22877}  Fund of Knowledge:  {BHH GOOD/FAIR/POOR:22877}  Language:  {BHH GOOD/FAIR/POOR:22877}  Akathisia:  {BHH YES OR NO:22294}  Handed:  {Handed:22697}  AIMS (if indicated):     Assets:  {Assets (PAA):22698}  ADL's:  {BHH JQZ'E:09233}  Cognition:  {chl bhh cognition:304700322}  Sleep:        Assessment and Plan: ***  Increase Nortriptyline 30mg  po qHS. Reminded pt to monitor for signs and symptoms of serotonin syndrome  Follow Up Instructions: In 2 months or sooner if needed   I discussed the assessment and treatment plan with the patient. The patient was provided an opportunity to ask questions and all were answered.  The patient agreed with the plan and demonstrated an understanding of the instructions.   The patient was advised to call back or seek an in-person evaluation if the symptoms worsen or if the condition fails to improve as anticipated.  I provided *** minutes of non-face-to-face time during this encounter.   , MD

## 2019-05-25 ENCOUNTER — Ambulatory Visit (INDEPENDENT_AMBULATORY_CARE_PROVIDER_SITE_OTHER): Payer: No Typology Code available for payment source | Admitting: Clinical

## 2019-05-25 DIAGNOSIS — F840 Autistic disorder: Secondary | ICD-10-CM

## 2019-05-27 ENCOUNTER — Ambulatory Visit (HOSPITAL_COMMUNITY): Payer: No Typology Code available for payment source | Admitting: Psychiatry

## 2019-05-28 MED FILL — OMEPRAZOLE DR 40 MG CAPSULE: 40 | 30 days supply | Qty: 60 | Fill #2

## 2019-06-01 ENCOUNTER — Ambulatory Visit (INDEPENDENT_AMBULATORY_CARE_PROVIDER_SITE_OTHER): Payer: No Typology Code available for payment source | Admitting: Clinical

## 2019-06-01 DIAGNOSIS — F840 Autistic disorder: Secondary | ICD-10-CM | POA: Diagnosis not present

## 2019-06-06 ENCOUNTER — Other Ambulatory Visit: Payer: Self-pay | Admitting: Obstetrics and Gynecology

## 2019-06-06 DIAGNOSIS — N926 Irregular menstruation, unspecified: Secondary | ICD-10-CM

## 2019-06-07 ENCOUNTER — Ambulatory Visit: Payer: No Typology Code available for payment source | Admitting: Clinical

## 2019-06-08 ENCOUNTER — Ambulatory Visit: Payer: No Typology Code available for payment source | Admitting: Clinical

## 2019-06-08 ENCOUNTER — Ambulatory Visit (INDEPENDENT_AMBULATORY_CARE_PROVIDER_SITE_OTHER): Payer: No Typology Code available for payment source | Admitting: Clinical

## 2019-06-08 DIAGNOSIS — F840 Autistic disorder: Secondary | ICD-10-CM | POA: Diagnosis not present

## 2019-06-15 ENCOUNTER — Ambulatory Visit (INDEPENDENT_AMBULATORY_CARE_PROVIDER_SITE_OTHER): Payer: No Typology Code available for payment source | Admitting: Clinical

## 2019-06-15 DIAGNOSIS — F840 Autistic disorder: Secondary | ICD-10-CM | POA: Diagnosis not present

## 2019-06-18 ENCOUNTER — Other Ambulatory Visit (HOSPITAL_COMMUNITY): Payer: Self-pay | Admitting: Psychiatry

## 2019-06-18 DIAGNOSIS — F3181 Bipolar II disorder: Secondary | ICD-10-CM

## 2019-06-21 ENCOUNTER — Ambulatory Visit (INDEPENDENT_AMBULATORY_CARE_PROVIDER_SITE_OTHER): Payer: No Typology Code available for payment source | Admitting: Clinical

## 2019-06-21 DIAGNOSIS — F840 Autistic disorder: Secondary | ICD-10-CM

## 2019-06-21 MED FILL — OMEPRAZOLE DR 40 MG CAPSULE: 40 | 30 days supply | Qty: 60 | Fill #3

## 2019-06-23 MED FILL — lamoTRIgine 200 MG TABS: 200 | 30 days supply | Qty: 60 | Fill #3

## 2019-06-28 ENCOUNTER — Ambulatory Visit (INDEPENDENT_AMBULATORY_CARE_PROVIDER_SITE_OTHER): Payer: No Typology Code available for payment source | Admitting: Clinical

## 2019-06-28 DIAGNOSIS — F840 Autistic disorder: Secondary | ICD-10-CM

## 2019-06-28 MED FILL — ARIPiprazole 2 MG TABS: 2 | 90 days supply | Qty: 45 | Fill #0

## 2019-06-29 ENCOUNTER — Ambulatory Visit: Payer: No Typology Code available for payment source | Admitting: Clinical

## 2019-07-01 ENCOUNTER — Encounter: Payer: Self-pay | Admitting: Neurology

## 2019-07-01 ENCOUNTER — Other Ambulatory Visit: Payer: Self-pay

## 2019-07-01 ENCOUNTER — Ambulatory Visit (INDEPENDENT_AMBULATORY_CARE_PROVIDER_SITE_OTHER): Payer: No Typology Code available for payment source | Admitting: Neurology

## 2019-07-01 DIAGNOSIS — G43019 Migraine without aura, intractable, without status migrainosus: Secondary | ICD-10-CM | POA: Diagnosis not present

## 2019-07-01 HISTORY — DX: Migraine without aura, intractable, without status migrainosus: G43.019

## 2019-07-01 MED ORDER — TOPIRAMATE 25 MG PO TABS: ORAL_TABLET | ORAL | 3 refills | Status: DC

## 2019-07-01 MED ORDER — SUMATRIPTAN SUCCINATE 100 MG PO TABS: 100.0000 mg | ORAL_TABLET | Freq: Two times a day (BID) | ORAL | 3 refills | Status: DC | PRN

## 2019-07-01 NOTE — Progress Notes (Signed)
Reason for visit: Migraine headache  Referring physician: Dr. Hollie Salk Jill Alexander is a 24 y.o. female  History of present illness:  Jill Alexander is a 24 year old left-handed white female with a history of an autistic disorder.  The patient has had frequent headaches over the last 5 years or so, she claims that she is having headaches about one half of the days of the month.  The patient indicates that the headaches are usually in the frontal areas bilaterally but may spread to all over the head.  The patient may have occasional nausea but no vomiting.  She may experience photophobia and phonophobia with the headache.  She may see spots of light in the eyes with the headache.  She denies any numbness or weakness, but she does have some difficulty focusing when the headache occurs.  She may take Excedrin Migraine if needed.  She was placed on nortriptyline working up to 30 mg at night in October 2020, but the headaches have continued.  She often times will wake up with headache in the morning.  The patient indicates that her father had severe headaches when he was young.  The patient is sent to this office for further evaluation.  The patient may drink 1 or 2 cups of coffee a day, otherwise she takes in very few other caffeinated products.  She denies any neck stiffness with a headache.  Past Medical History:  Diagnosis Date  . Anxiety   . Autism   . Chronic nausea   . Depression   . Dyspepsia   . GERD (gastroesophageal reflux disease)   . Headache   . Heart murmur   . Seizures (HCC)   . 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 none      Family History  Problem Relation Age of Onset  . Depression Father   . Anxiety disorder Sister   . Bipolar disorder Sister   . Diabetes Maternal Grandfather   . Cancer Maternal Grandfather   . Breast cancer Paternal Grandmother   . Uterine cancer Paternal Grandmother   . Irritable bowel  syndrome Paternal Grandmother   . Colon cancer Neg Hx   . Esophageal cancer Neg Hx   . Stomach cancer Neg Hx   . Rectal cancer Neg Hx     Social history:  reports that she has never smoked. She has never used smokeless tobacco. She reports that she does not drink alcohol or use drugs.  Medications:  Prior to Admission medications   Medication Sig Start Date End Date Taking? Authorizing Provider  acetaminophen (TYLENOL) 500 MG tablet Take 500 mg by mouth every 6 (six) hours as needed.   Yes [provider]  ARIPiprazole (ABILIFY) 2 MG tablet TAKE 1/2 TABLET BY MOUTH DAILY 06/24/19  Yes Oletta Darter, MD  Docusate Sodium (COLACE PO) Take by mouth.   Yes [provider]  Famotidine (PEPCID PO) Take by mouth.   Yes [provider]  FLUoxetine (PROZAC) 20 MG capsule Take 3 capsules (60 mg total) by mouth daily. 05/21/19 05/20/20 Yes Oletta Darter, MD  Ibuprofen (MOTRIN PO) Take 200 mg by mouth as needed.    Yes [provider]  lamoTRIgine (LAMICTAL) 200 MG tablet Take 1 tablet (200 mg total) by mouth 2 (two) times daily. 05/21/19  Yes Oletta Darter, MD  LORazepam (ATIVAN) 2 MG tablet Take 1 tablet (2 mg total) by mouth 2 (two) times daily. 05/21/19  Yes Oletta Darter, MD  Melatonin 1 MG TABS Take 1 mg by mouth at bedtime as needed (insomnia).   Yes [provider]  metoCLOPramide (REGLAN) 5 MG tablet Take 1 tablet (5 mg total) by mouth 3 (three) times daily as needed for nausea. 04/19/19  Yes Armbruster, Willaim Rayas, MD  Multiple Vitamins-Minerals (MULTIVITAMIN PO) Take 1 tablet by mouth daily.   Yes [provider]  nortriptyline (PAMELOR) 10 MG capsule Take 3 capsules (30 mg total) by mouth at bedtime. 05/21/19  Yes Oletta Darter, MD  omeprazole (PRILOSEC) 40 MG capsule TAKE 1 CAPSULE BY MOUTH TWICE A DAY 10/05/18  Yes Armbruster, Willaim Rayas, MD  ondansetron (ZOFRAN-ODT) 8 MG disintegrating tablet Take 1 tablet (8 mg total) by mouth every 6  (six) hours as needed for nausea or vomiting. 04/19/19  Yes Armbruster, Willaim Rayas, MD  polyethylene glycol (MIRALAX) packet Take 17 g by mouth daily as needed. 11/19/17  Yes Armbruster, Willaim Rayas, MD  TAYTULLA 1-20 MG-MCG(24) CAPS TAKE 1 CAPSULE BY MOUTH EVERY DAY 06/08/19  Yes Raelyn Mora, CNM      Allergies  Allergen Reactions  . Gluten Meal     ROS:  Out of a complete 14 system review of symptoms, the patient complains only of the following symptoms, and all other reviewed systems are negative.  Headache Tremor  Blood pressure 106/63, pulse 80, temperature 98.9 F (37.2 C), height 5\' 3"  (1.6 m), weight 144 lb (65.3 kg).  Physical Exam  General: The patient is alert and cooperative at the time of the examination.  Eyes: Pupils are equal, round, and reactive to light. Discs are flat bilaterally.  Neck: The neck is supple, no carotid bruits are noted.  Respiratory: The respiratory examination is clear.  Cardiovascular: The cardiovascular examination reveals a regular rate and rhythm, no obvious murmurs or rubs are noted.  Skin: Extremities are without significant edema.  Neurologic Exam  Mental status: The patient is alert and oriented x 3 at the time of the examination. The patient has apparent normal recent and remote memory, with an apparently normal attention span and concentration ability.  Cranial nerves: Facial symmetry is present. There is good sensation of the face to pinprick and soft touch bilaterally. The strength of the facial muscles and the muscles to head turning and shoulder shrug are normal bilaterally. Speech is well enunciated, no aphasia or dysarthria is noted. Extraocular movements are full. Visual fields are full. The tongue is midline, and the patient has symmetric elevation of the soft palate. No obvious hearing deficits are noted.  Motor: The motor testing reveals 5 over 5 strength of all 4 extremities. Good symmetric motor tone is noted  throughout.  Sensory: Sensory testing is intact to pinprick, soft touch, vibration sensation, and position sense on all 4 extremities. No evidence of extinction is noted.  Coordination: Cerebellar testing reveals good finger-nose-finger and heel-to-shin bilaterally.  Some apraxia with use of extremities is noted.  Fine action type tremors are noted with both upper extremities.  Gait and station: Gait is normal. Tandem gait is minimally unsteady. Romberg is negative. No drift is seen.  Reflexes: Deep tendon reflexes are symmetric and normal bilaterally. Toes are downgoing bilaterally.   Assessment/Plan:  1.  Intractable migraine headache  The patient will be placed on Topamax working up to 75 mg at night.  She will continue the nortriptyline for now.  Imitrex will be given to take if needed.  She will follow-up here in 3 months, she  will call our office for any dose adjustments of the medications.  Jill Alexanders MD 07/01/2019 10:55 AM  [facility] [address] [place], [place] [address]  Phone [phone] Fax [phone]

## 2019-07-01 NOTE — Patient Instructions (Signed)
We will start Topamax for the headache. Take Imitrex 100 mg if needed for the headache.  Topamax (topiramate) is a seizure medication that has an FDA approval for seizures and for migraine headache. Potential side effects of this medication include weight loss, cognitive slowing, tingling in the fingers and toes, and carbonated drinks will taste bad. If any significant side effects are noted on this drug, please contact our office.

## 2019-07-05 ENCOUNTER — Ambulatory Visit (INDEPENDENT_AMBULATORY_CARE_PROVIDER_SITE_OTHER): Payer: No Typology Code available for payment source | Admitting: Clinical

## 2019-07-05 DIAGNOSIS — F840 Autistic disorder: Secondary | ICD-10-CM | POA: Diagnosis not present

## 2019-07-07 ENCOUNTER — Encounter: Payer: Self-pay | Admitting: Family Medicine

## 2019-07-08 ENCOUNTER — Other Ambulatory Visit: Payer: Self-pay

## 2019-07-08 ENCOUNTER — Ambulatory Visit (HOSPITAL_COMMUNITY): Payer: No Typology Code available for payment source | Admitting: Psychiatry

## 2019-07-08 ENCOUNTER — Encounter (HOSPITAL_COMMUNITY): Payer: Self-pay | Admitting: Psychiatry

## 2019-07-08 DIAGNOSIS — F410 Panic disorder [episodic paroxysmal anxiety] without agoraphobia: Secondary | ICD-10-CM

## 2019-07-08 DIAGNOSIS — F3181 Bipolar II disorder: Secondary | ICD-10-CM

## 2019-07-08 DIAGNOSIS — F840 Autistic disorder: Secondary | ICD-10-CM

## 2019-07-08 DIAGNOSIS — F4010 Social phobia, unspecified: Secondary | ICD-10-CM

## 2019-07-08 MED ORDER — NORTRIPTYLINE HCL 10 MG PO CAPS: 30.0000 mg | ORAL_CAPSULE | Freq: Every day | ORAL | 0 refills | Status: DC

## 2019-07-08 MED ORDER — FLUOXETINE HCL 20 MG PO CAPS: 60.0000 mg | ORAL_CAPSULE | Freq: Every day | ORAL | 3 refills | Status: DC

## 2019-07-08 MED ORDER — LORAZEPAM 2 MG PO TABS: 2.0000 mg | ORAL_TABLET | Freq: Two times a day (BID) | ORAL | 1 refills | Status: DC

## 2019-07-08 MED ORDER — LAMOTRIGINE 200 MG PO TABS: 200.0000 mg | ORAL_TABLET | Freq: Two times a day (BID) | ORAL | 0 refills | Status: DC

## 2019-07-08 MED ORDER — ARIPIPRAZOLE 2 MG PO TABS: 1.0000 mg | ORAL_TABLET | Freq: Every day | ORAL | 0 refills | Status: DC

## 2019-07-08 NOTE — Progress Notes (Incomplete)
Virtual Visit via Telephone Note  I connected with Jill Alexander on 07/08/19 at  2:30 PM EST by telephone and verified that I am speaking with the correct person using two identifiers.  Location: Patient: home Provider: office   I discussed the limitations, risks, security and privacy concerns of performing an evaluation and management service by telephone and the availability of in person appointments. I also discussed with the patient that there may be a patient responsible charge related to this service. The patient expressed understanding and agreed to proceed.   History of Present Illness: "Honestly not that great". She went to the neurologist and was dx with migraines and was prescribed 25mg  topamax and sumatriptan. She is having significant mood swings after every meal. Her depression seems a little worse. At times her mood is alright. Her father has pointed it out several times and it was useful to her because she didn't notice any change.      Observations/Objective:  General Appearance: unable to assess  Eye Contact:  unable to assess  Speech:  {Speech:22685}  Volume:  {Volume (PAA):22686}  Mood:  {BHH MOOD:22306}  Affect:  {Affect (PAA):22687}  Thought Process:  {Thought Process (PAA):22688}  Orientation:  {BHH ORIENTATION (PAA):22689}  Thought Content:  {Thought Content:22690}  Suicidal Thoughts:  {ST/HT (PAA):22692}  Homicidal Thoughts:  {ST/HT (PAA):22692}  Memory:  {BHH MEMORY:22881}  Judgement:  {Judgement (PAA):22694}  Insight:  {Insight (PAA):22695}  Psychomotor Activity: unable to assess  Concentration:  {Concentration:21399}  Recall:  {BHH GOOD/FAIR/POOR:22877}  Fund of Knowledge:  {BHH GOOD/FAIR/POOR:22877}  Language:  {BHH GOOD/FAIR/POOR:22877}  Akathisia:  unable to assess  Handed:  {Handed:22697}  AIMS (if indicated):     Assets:  {Assets (PAA):22698}  ADL's:  unable to assess  Cognition:  {chl bhh cognition:304700322}  Sleep:          Assessment and Plan: 1. Bipolar 2 disorder (HCC) - ARIPiprazole (ABILIFY) 2 MG tablet; Take 0.5 tablets (1 mg total) by mouth daily.  Dispense: 45 tablet; Refill: 0 - FLUoxetine (PROZAC) 20 MG capsule; Take 3 capsules (60 mg total) by mouth daily.  Dispense: 270 capsule; Refill: 3 - lamoTRIgine (LAMICTAL) 200 MG tablet; Take 1 tablet (200 mg total) by mouth 2 (two) times daily.  Dispense: 180 tablet; Refill: 0 - nortriptyline (PAMELOR) 10 MG capsule; Take 3 capsules (30 mg total) by mouth at bedtime.  Dispense: 270 capsule; Refill: 0  2. Panic attacks - LORazepam (ATIVAN) 2 MG tablet; Take 1 tablet (2 mg total) by mouth 2 (two) times daily.  Dispense: 60 tablet; Refill: 1  3. Social anxiety disorder - FLUoxetine (PROZAC) 20 MG capsule; Take 3 capsules (60 mg total) by mouth daily.  Dispense: 270 capsule; Refill: 3 - LORazepam (ATIVAN) 2 MG tablet; Take 1 tablet (2 mg total) by mouth 2 (two) times daily.  Dispense: 60 tablet; Refill: 1 - nortriptyline (PAMELOR) 10 MG capsule; Take 3 capsules (30 mg total) by mouth at bedtime.  Dispense: 270 capsule; Refill: 0  4. Autism spectrum disorder   She has been prescribed Topamax by the neurologist for migraines. It can also benefit her mood.    Follow Up Instructions: April 29 at 10am   I discussed the assessment and treatment plan with the patient. The patient was provided an opportunity to ask questions and all were answered. The patient agreed with the plan and demonstrated an understanding of the instructions.   The patient was advised to call back or seek an in-person evaluation if the  symptoms worsen or if the condition fails to improve as anticipated.  I provided 30 minutes of non-face-to-face time during this encounter.   Oletta Darter, MD

## 2019-07-13 ENCOUNTER — Ambulatory Visit (INDEPENDENT_AMBULATORY_CARE_PROVIDER_SITE_OTHER): Payer: No Typology Code available for payment source | Admitting: Clinical

## 2019-07-13 DIAGNOSIS — F840 Autistic disorder: Secondary | ICD-10-CM | POA: Diagnosis not present

## 2019-07-14 ENCOUNTER — Other Ambulatory Visit: Payer: Self-pay

## 2019-07-14 ENCOUNTER — Encounter: Payer: Self-pay | Admitting: Family Medicine

## 2019-07-14 ENCOUNTER — Ambulatory Visit (INDEPENDENT_AMBULATORY_CARE_PROVIDER_SITE_OTHER): Payer: No Typology Code available for payment source | Admitting: Family Medicine

## 2019-07-14 VITALS — BP 110/70 | HR 98 | Temp 97.0°F | Wt 142.2 lb

## 2019-07-14 DIAGNOSIS — F840 Autistic disorder: Secondary | ICD-10-CM | POA: Diagnosis not present

## 2019-07-14 DIAGNOSIS — F3181 Bipolar II disorder: Secondary | ICD-10-CM

## 2019-07-14 DIAGNOSIS — R4586 Emotional lability: Secondary | ICD-10-CM | POA: Diagnosis not present

## 2019-07-14 DIAGNOSIS — G250 Essential tremor: Secondary | ICD-10-CM | POA: Diagnosis not present

## 2019-07-14 LAB — HEMOGLOBIN A1C
Est. average glucose Bld gHb Est-mCnc: 103 mg/dL
Hgb A1c MFr Bld: 5.2 % (ref 4.8–5.6)

## 2019-07-14 NOTE — Progress Notes (Signed)
   Subjective:    Patient ID: Jill Alexander, female    DOB: [birth year], 24 y.o.   MRN: [id number]  HPI Chief Complaint  Patient presents with  . hormone changes    hormone changes- mood swings more so around meal time- been going on for a while. seeing a therapist.  therapist not sure if its hormone or not and wants labs checked.    She is here to discuss mood swings. States these have been worse around meal time. Feels anxious at times before eating.   She is seeing her psychiatrist and therapist. States they think she should have labs checked to rule out endocrine etiologies.  She is taking several medications for mood.   She is taking birth control pills which seems to help with her PMS and with her heavy, painful periods. No longer having her cycle but has some mild cramping during placebo pills.   Her mother was on the phone and I also obtained information from her.  She reports patient has had mood swings similar in the past but these are more bothersome.   Denies fever, chills, chest pain, abdominal pain, N/V/D.   Headaches have improved. She sees neurology.    Review of Systems Pertinent positives and negatives in the history of present illness.     Objective:   Physical Exam BP 110/70   Pulse 98   Temp (!) 97 F (36.1 C)   Wt 142 lb 3.2 oz (64.5 kg)   BMI 25.19 kg/m   Alert and oriented and in no acute distress.Cardiac exam shows a regular sinus rhythm without murmurs or gallops. Lungs are clear to auscultation.  Skin is warm and dry.      Assessment & Plan:  Mood swings - Plan: Hemoglobin A1c  Autism spectrum disorder  Tremor, essential  Bipolar 2 disorder [facility])  She has several specialists including psychiatrist, therapist, neurologist and OB/GYN. She and her mother are concerned regarding recent worsening mood swings.  Patient feels that her mood is worse prior to meals.  She is on OCPs.  Reviewed labs from 05/13/2019 which showed normal CBC,  CMP, TSH and free T4.  Discussed possibility of fluctuations in blood sugars. I will check a Hgb A1c and she and her mother will start monitoring BS at home particularly when Jill Alexander feels like she is having a bad time. Follow up with me in the next couple of weeks with more information. If blood sugars are normal, consider referral back to gynecologist to discuss whether hormones may be contributing and trying non hormonal birth control.

## 2019-07-14 NOTE — Patient Instructions (Signed)
Check your blood sugar when you are feeling bad and see what your readings look like.

## 2019-07-19 ENCOUNTER — Ambulatory Visit: Payer: No Typology Code available for payment source | Admitting: Clinical

## 2019-07-20 ENCOUNTER — Ambulatory Visit (INDEPENDENT_AMBULATORY_CARE_PROVIDER_SITE_OTHER): Payer: No Typology Code available for payment source | Admitting: Clinical

## 2019-07-20 DIAGNOSIS — F840 Autistic disorder: Secondary | ICD-10-CM

## 2019-07-27 ENCOUNTER — Ambulatory Visit (INDEPENDENT_AMBULATORY_CARE_PROVIDER_SITE_OTHER): Payer: No Typology Code available for payment source | Admitting: Clinical

## 2019-07-27 DIAGNOSIS — F840 Autistic disorder: Secondary | ICD-10-CM | POA: Diagnosis not present

## 2019-08-02 ENCOUNTER — Ambulatory Visit: Payer: No Typology Code available for payment source | Admitting: Clinical

## 2019-08-02 ENCOUNTER — Other Ambulatory Visit: Payer: Self-pay

## 2019-08-02 ENCOUNTER — Ambulatory Visit (INDEPENDENT_AMBULATORY_CARE_PROVIDER_SITE_OTHER): Payer: No Typology Code available for payment source | Admitting: Clinical

## 2019-08-02 DIAGNOSIS — F840 Autistic disorder: Secondary | ICD-10-CM

## 2019-08-02 MED ORDER — OMEPRAZOLE 40 MG PO CPDR: 40.0000 mg | DELAYED_RELEASE_CAPSULE | Freq: Two times a day (BID) | ORAL | 3 refills | Status: DC

## 2019-08-02 MED FILL — OMEPRAZOLE 40 MG CPDR: 40 | 30 days supply | Qty: 60 | Fill #0

## 2019-08-02 NOTE — Progress Notes (Signed)
Script sent for omeprazole 40mg  bid.

## 2019-08-03 ENCOUNTER — Ambulatory Visit (INDEPENDENT_AMBULATORY_CARE_PROVIDER_SITE_OTHER): Payer: No Typology Code available for payment source | Admitting: Clinical

## 2019-08-03 DIAGNOSIS — F840 Autistic disorder: Secondary | ICD-10-CM | POA: Diagnosis not present

## 2019-08-10 ENCOUNTER — Ambulatory Visit (INDEPENDENT_AMBULATORY_CARE_PROVIDER_SITE_OTHER): Payer: No Typology Code available for payment source | Admitting: Clinical

## 2019-08-10 DIAGNOSIS — F840 Autistic disorder: Secondary | ICD-10-CM

## 2019-08-12 ENCOUNTER — Ambulatory Visit (INDEPENDENT_AMBULATORY_CARE_PROVIDER_SITE_OTHER): Payer: No Typology Code available for payment source | Admitting: Clinical

## 2019-08-12 DIAGNOSIS — F840 Autistic disorder: Secondary | ICD-10-CM

## 2019-08-23 ENCOUNTER — Telehealth (HOSPITAL_COMMUNITY): Payer: Self-pay | Admitting: *Deleted

## 2019-08-23 NOTE — Telephone Encounter (Signed)
Pt called requesting refill of Ativan 2mg  bid, last written on 07/08/19 with 1 refill. Pt has an upcoming appointment on 08/26/19. Please review and advise.

## 2019-08-25 ENCOUNTER — Encounter: Payer: Self-pay | Admitting: Family Medicine

## 2019-08-25 ENCOUNTER — Other Ambulatory Visit: Payer: Self-pay

## 2019-08-25 ENCOUNTER — Ambulatory Visit (INDEPENDENT_AMBULATORY_CARE_PROVIDER_SITE_OTHER): Payer: No Typology Code available for payment source | Admitting: Family Medicine

## 2019-08-25 VITALS — BP 110/70 | HR 75 | Temp 97.3°F | Wt 143.4 lb

## 2019-08-25 DIAGNOSIS — R300 Dysuria: Secondary | ICD-10-CM

## 2019-08-25 DIAGNOSIS — R350 Frequency of micturition: Secondary | ICD-10-CM | POA: Diagnosis not present

## 2019-08-25 DIAGNOSIS — R3915 Urgency of urination: Secondary | ICD-10-CM

## 2019-08-25 DIAGNOSIS — R1031 Right lower quadrant pain: Secondary | ICD-10-CM | POA: Diagnosis not present

## 2019-08-25 DIAGNOSIS — R1032 Left lower quadrant pain: Secondary | ICD-10-CM

## 2019-08-25 DIAGNOSIS — K5909 Other constipation: Secondary | ICD-10-CM

## 2019-08-25 LAB — POCT URINALYSIS DIP (PROADVANTAGE DEVICE)
Bilirubin, UA: NEGATIVE
Blood, UA: NEGATIVE
Glucose, UA: NEGATIVE mg/dL
Ketones, POC UA: NEGATIVE mg/dL
Leukocytes, UA: NEGATIVE
Nitrite, UA: NEGATIVE
Protein Ur, POC: NEGATIVE mg/dL
Specific Gravity, Urine: 1.01
Urobilinogen, Ur: NEGATIVE
pH, UA: 6.5 (ref 5.0–8.0)

## 2019-08-25 NOTE — Progress Notes (Signed)
Subjective:  Jill Alexander is a 24 y.o. female who complains of possible urinary tract infection.  She has had symptoms for 3 days.  Symptoms include dysuria, urinary urgency, urinary frequency, lower abdominal pain, . Patient denies fever, chills, back pain, nausea, vomiting.  Last UTI was unknown.   Using Advil for current symptoms.  Drinking some caffeine and a lot of water.   ?constipation issues. Last BM 2 days ago. Has not been taking Colace and Miralax like usual.   Patient does not have a history of recurrent UTI. Patient does not have a history of pyelonephritis.  No other aggravating or relieving factors.  No other c/o.  Past Medical History:  Diagnosis Date  . Anxiety   . Autism   . Chronic nausea   . Common migraine with intractable migraine 07/01/2019  . Depression   . Dyspepsia   . GERD (gastroesophageal reflux disease)   . Headache   . Heart murmur   . Seizures (HCC)   . Tremor, 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    ROS as in subjective  Reviewed allergies, medications, past medical, surgical, and social history.    Objective: Vitals:   08/25/19 1206  BP: 110/70  Pulse: 75  Temp: (!) 97.3 F (36.3 C)    General appearance: alert, no distress, WD/WN, female Abdomen: +bs, soft, non tender, non distended, no masses, no hepatomegaly, no splenomegaly, no bruits Back: no CVA tenderness GU: deferred     Laboratory:  Urine dipstick: negative for all components.       Assessment: Dysuria - Plan: Urine Culture  Urinary urgency - Plan: Urine Culture  Urinary frequency - Plan: Urine Culture  Bilateral lower abdominal cramping - Plan: Urine Culture  Intermittent constipation    Plan: Discussed symptoms, diagnosis, possible complications, and usual course of illness.   You describe symptoms for constipation.  This can sometimes make you feel like you are having to urinate more frequently.  I recommend that you start back on the stool softener or MiraLAX to get  your bowels more regular.  Please have a discussion with your mother regarding whether he would like to try an antibiotic such as Bactrim twice daily for 3 days to see if this helps your symptoms.  I will send it to your pharmacy if you let me know.  You may also want to try Azo-Standard to see if this helps but I do not recommend taking it more than a couple of days because it masks your symptoms Urine culture sent.   Call or return if worse or not improving.

## 2019-08-25 NOTE — Patient Instructions (Signed)
Your urine culture does not show that you have a urinary tract infection.   I am sending your urine for a culture.  You describe symptoms for constipation.  This can sometimes make you feel like you are having to urinate more frequently.  I recommend that you start back on the stool softener or MiraLAX to get your bowels more regular.   Please have a discussion with your mother regarding whether he would like to try an antibiotic such as Bactrim twice daily for 3 days to see if this helps your symptoms.  I will send it to your pharmacy if you let me know.  You may also want to try Azo-Standard to see if this helps but I do not recommend taking it more than a couple of days because it masks your symptoms

## 2019-08-26 ENCOUNTER — Encounter (HOSPITAL_COMMUNITY): Payer: Self-pay | Admitting: Psychiatry

## 2019-08-26 ENCOUNTER — Telehealth (INDEPENDENT_AMBULATORY_CARE_PROVIDER_SITE_OTHER): Payer: No Typology Code available for payment source | Admitting: Psychiatry

## 2019-08-26 DIAGNOSIS — G40909 Epilepsy, unspecified, not intractable, without status epilepticus: Secondary | ICD-10-CM | POA: Diagnosis not present

## 2019-08-26 DIAGNOSIS — F3181 Bipolar II disorder: Secondary | ICD-10-CM

## 2019-08-26 DIAGNOSIS — F410 Panic disorder [episodic paroxysmal anxiety] without agoraphobia: Secondary | ICD-10-CM

## 2019-08-26 DIAGNOSIS — F39 Unspecified mood [affective] disorder: Secondary | ICD-10-CM | POA: Diagnosis not present

## 2019-08-26 DIAGNOSIS — F840 Autistic disorder: Secondary | ICD-10-CM

## 2019-08-26 DIAGNOSIS — F4010 Social phobia, unspecified: Secondary | ICD-10-CM

## 2019-08-26 LAB — URINE CULTURE

## 2019-08-26 MED ORDER — LORAZEPAM 2 MG PO TABS: 2.0000 mg | ORAL_TABLET | Freq: Two times a day (BID) | ORAL | 1 refills | Status: DC

## 2019-08-26 MED ORDER — FLUOXETINE HCL 20 MG PO CAPS: 60.0000 mg | ORAL_CAPSULE | Freq: Every day | ORAL | 3 refills | Status: DC

## 2019-08-26 MED ORDER — LAMOTRIGINE 200 MG PO TABS: 200.0000 mg | ORAL_TABLET | Freq: Two times a day (BID) | ORAL | 0 refills | Status: DC

## 2019-08-26 MED ORDER — NORTRIPTYLINE HCL 10 MG PO CAPS: 30.0000 mg | ORAL_CAPSULE | Freq: Every day | ORAL | 0 refills | Status: DC

## 2019-08-26 MED ORDER — OMEPRAZOLE 40 MG PO CPDR: 40.0000 mg | DELAYED_RELEASE_CAPSULE | Freq: Two times a day (BID) | ORAL | 3 refills | Status: DC

## 2019-08-26 MED ORDER — ARIPIPRAZOLE 2 MG PO TABS: 1.0000 mg | ORAL_TABLET | Freq: Every day | ORAL | 0 refills | Status: DC

## 2019-08-26 NOTE — BH Specialist Note (Signed)
Virtual Visit via Telephone Note  I connected with Magaly Pollina on 08/26/19 at 10:45 AM EDT by telephone and verified that I am speaking with the correct person using two identifiers.  Location: Patient: home Provider: office   I discussed the limitations, risks, security and privacy concerns of performing an evaluation and management service by telephone and the availability of in person appointments. I also discussed with the patient that there may be a patient responsible charge related to this service. The patient expressed understanding and agreed to proceed.   History of Present Illness: "Not good". [name] has ongoing mood swings along with her depression. [name] has not seen her therapist in 2 weeks because her therapist was on vacation. She feels this could be contributing to her current presentation. She stopped going to the [employer] a few weeks ago. She rarely attended the classes. They are currently looking into other programs. One place is called [employer]. [name] has on/off SI without plan or intent. She denies HI.    Observations/Objective:  General Appearance: unable to assess  Eye Contact:  unable to assess  Speech:  Clear and Coherent and Slow  Volume:  Normal  Mood:  Depressed  Affect:  Blunt  Thought Process:  Coherent and Descriptions of Associations: Circumstantial  Orientation:  Full (Time, Place, and Person)  Thought Content:  Rumination  Suicidal Thoughts:  Yes.  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 Abilify 1mg  po qHS - started at treatment program   2. Lamictal 200 mg p.o. twice daily- increased at treatment program   3. Ativan 2 mg p.o. twice daily as needed anxiety or mood swings    4. Prozac 60mg  po qD for MDD. Discussed Serotonin syndrome in detail along with plan.    5. Nortriptyline 30mg  po qHS for insomnia  [name] is looking into supportive employment. At this point we have tried numerous medications with no benefit. I encouraged [name] to find structure in her day as a way to deal with her depression.    Ketamine- not eligible    ECT- did not find much benefit   She attended the [facility] program for a short period of time in early 2021.  I encouraged her to continue individual therapy (Dr. Loyal Buba) weekly, DBT and supportive employment.   Pt received treatment at a residental treatment facility from Aug 24 Feb 27, 2019.  Patient completed PHP from 7/1-7/14/2020. She then transitioned to [facility] and was d/c on 11/20/2018.  Overall patient reports no benefit from any of these treatment modalities.    Follow Up Instructions: In 2-3 months or sooner if needed   I discussed the assessment and treatment plan with  the patient. The patient was provided an opportunity to ask questions and all were answered. The patient agreed with the plan and demonstrated an understanding of the instructions.   The patient was advised to call back or seek an in-person evaluation if the symptoms worsen or if the condition fails to improve as anticipated.  I provided 35 minutes of non-face-to-face time during this encounter.   Oletta Darter, MD

## 2019-08-30 ENCOUNTER — Ambulatory Visit (INDEPENDENT_AMBULATORY_CARE_PROVIDER_SITE_OTHER): Payer: No Typology Code available for payment source | Admitting: Clinical

## 2019-08-30 ENCOUNTER — Encounter: Payer: Self-pay | Admitting: Family Medicine

## 2019-08-30 DIAGNOSIS — F840 Autistic disorder: Secondary | ICD-10-CM

## 2019-08-31 ENCOUNTER — Ambulatory Visit (INDEPENDENT_AMBULATORY_CARE_PROVIDER_SITE_OTHER): Payer: No Typology Code available for payment source | Admitting: Clinical

## 2019-08-31 DIAGNOSIS — F840 Autistic disorder: Secondary | ICD-10-CM | POA: Diagnosis not present

## 2019-09-05 ENCOUNTER — Other Ambulatory Visit: Payer: Self-pay | Admitting: Gastroenterology

## 2019-09-06 ENCOUNTER — Telehealth: Payer: Self-pay | Admitting: *Deleted

## 2019-09-06 NOTE — Telephone Encounter (Signed)
Patient called requesting refill on birth control pills. Stated she is not sexually active. Advised patient that she has not been seen in our office in over a year. She will need to have an annual exam before refills can be prescribed. Patient stated she will call the office back in a hour or so after she check her schedule.  Clovis Pu, RN

## 2019-09-07 ENCOUNTER — Ambulatory Visit: Payer: No Typology Code available for payment source | Admitting: Clinical

## 2019-09-08 ENCOUNTER — Ambulatory Visit (INDEPENDENT_AMBULATORY_CARE_PROVIDER_SITE_OTHER): Payer: No Typology Code available for payment source | Admitting: Clinical

## 2019-09-08 DIAGNOSIS — F840 Autistic disorder: Secondary | ICD-10-CM

## 2019-09-13 ENCOUNTER — Ambulatory Visit (INDEPENDENT_AMBULATORY_CARE_PROVIDER_SITE_OTHER): Payer: No Typology Code available for payment source | Admitting: Clinical

## 2019-09-13 DIAGNOSIS — F840 Autistic disorder: Secondary | ICD-10-CM | POA: Diagnosis not present

## 2019-09-14 ENCOUNTER — Ambulatory Visit (INDEPENDENT_AMBULATORY_CARE_PROVIDER_SITE_OTHER): Payer: No Typology Code available for payment source | Admitting: Clinical

## 2019-09-14 ENCOUNTER — Other Ambulatory Visit: Payer: Self-pay | Admitting: *Deleted

## 2019-09-14 DIAGNOSIS — F840 Autistic disorder: Secondary | ICD-10-CM

## 2019-09-14 DIAGNOSIS — N926 Irregular menstruation, unspecified: Secondary | ICD-10-CM

## 2019-09-14 MED ORDER — NORETHIN ACE-ETH ESTRAD-FE 1-20 MG-MCG(24) PO CAPS: 1.0000 | ORAL_CAPSULE | Freq: Every day | ORAL | 0 refills | Status: DC

## 2019-09-14 NOTE — Telephone Encounter (Signed)
RF request received from [facility] for OCP's.  Pt was a patient at [facility] but is switching to [facility].  She has an appt with J Rasch NP in June for annual.  Rf authorized and sent to her pharmacy.

## 2019-09-21 ENCOUNTER — Ambulatory Visit (INDEPENDENT_AMBULATORY_CARE_PROVIDER_SITE_OTHER): Payer: No Typology Code available for payment source | Admitting: Clinical

## 2019-09-21 DIAGNOSIS — F840 Autistic disorder: Secondary | ICD-10-CM

## 2019-09-25 ENCOUNTER — Encounter: Payer: Self-pay | Admitting: Family Medicine

## 2019-09-28 ENCOUNTER — Ambulatory Visit (INDEPENDENT_AMBULATORY_CARE_PROVIDER_SITE_OTHER): Payer: No Typology Code available for payment source | Admitting: Clinical

## 2019-09-28 DIAGNOSIS — F840 Autistic disorder: Secondary | ICD-10-CM

## 2019-09-29 ENCOUNTER — Other Ambulatory Visit: Payer: Self-pay | Admitting: Obstetrics and Gynecology

## 2019-09-29 MED ORDER — DIAZEPAM 2 MG PO TABS: 2.0000 mg | ORAL_TABLET | Freq: Once | ORAL | 0 refills | Status: AC

## 2019-09-29 MED ORDER — CYCLOBENZAPRINE HCL 10 MG PO TABS
10.0000 mg | ORAL_TABLET | Freq: Three times a day (TID) | ORAL | 0 refills | Status: DC | PRN
Start: 2019-09-29 — End: 2020-02-21

## 2019-09-29 NOTE — Progress Notes (Signed)
Flexeril and Valium sent in for pre-pelvic exam medication. Patient with severe anxiety and autism.    Venia Carbon I, NP 09/29/2019 2:13 PM

## 2019-10-01 ENCOUNTER — Encounter: Payer: Self-pay | Admitting: Obstetrics and Gynecology

## 2019-10-01 ENCOUNTER — Ambulatory Visit (INDEPENDENT_AMBULATORY_CARE_PROVIDER_SITE_OTHER): Payer: No Typology Code available for payment source | Admitting: Obstetrics and Gynecology

## 2019-10-01 ENCOUNTER — Other Ambulatory Visit (HOSPITAL_COMMUNITY)
Admission: RE | Admit: 2019-10-01 | Discharge: 2019-10-01 | Disposition: A | Discharge status: Home / Self Care | Payer: No Typology Code available for payment source | Source: Ambulatory Visit | Attending: Obstetrics and Gynecology | Admitting: Obstetrics and Gynecology

## 2019-10-01 ENCOUNTER — Other Ambulatory Visit: Payer: Self-pay

## 2019-10-01 VITALS — BP 104/73 | HR 71 | Resp 16 | Ht 62.0 in | Wt 142.0 lb

## 2019-10-01 DIAGNOSIS — Z1272 Encounter for screening for malignant neoplasm of vagina: Secondary | ICD-10-CM

## 2019-10-01 DIAGNOSIS — Z01419 Encounter for gynecological examination (general) (routine) without abnormal findings: Secondary | ICD-10-CM

## 2019-10-01 DIAGNOSIS — N926 Irregular menstruation, unspecified: Secondary | ICD-10-CM | POA: Diagnosis not present

## 2019-10-01 MED ORDER — NORETHIN ACE-ETH ESTRAD-FE 1-20 MG-MCG(24) PO CAPS: 1.0000 | ORAL_CAPSULE | Freq: Every day | ORAL | 11 refills | Status: DC

## 2019-10-01 NOTE — Progress Notes (Signed)
GYNECOLOGY ANNUAL PREVENTATIVE CARE ENCOUNTER NOTE  History:     Jill Alexander is a 24 y.o. G0P0000 female here for a routine annual gynecologic exam.  Current complaints: none.   Denies abnormal vaginal bleeding, discharge, pelvic pain, problems with intercourse or other gynecologic concerns.    Gynecologic History Patient's last menstrual period was 09/11/2019. Contraception: OCP (estrogen/progesterone) Last Pap: 2018. Results were: normal with negative HPV Last mammogram: NA   Obstetric History OB History  Gravida Para Term Preterm AB Living  0 0 0 0 0 0  SAB TAB Ectopic Multiple Live Births  0 0 0 0 0    Past Medical History:  Diagnosis Date  . Anxiety   . Autism   . Chronic nausea   . Common migraine with intractable migraine 07/01/2019  . Depression   . Dyspepsia   . GERD (gastroesophageal reflux disease)   . Headache   . Heart murmur   . Seizures (HCC)   . 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 none      Current Outpatient Medications on File Prior to Visit  Medication Sig Dispense Refill  . acetaminophen (TYLENOL) 500 MG tablet Take 500 mg by mouth every 6 (six) hours as needed.    . ARIPiprazole (ABILIFY) 2 MG tablet Take 0.5 tablets (1 mg total) by mouth daily. 45 tablet 0  . cyclobenzaprine (FLEXERIL) 10 MG tablet Take 1 tablet (10 mg total) by mouth every 8 (eight) hours as needed for muscle spasms. Take 2 hours prior to exam 1 tablet 0  . Docusate Sodium (COLACE PO) Take by mouth.    . Famotidine (PEPCID PO) Take by mouth.    Marland Kitchen FLUoxetine (PROZAC) 20 MG capsule Take 3 capsules (60 mg total) by mouth daily. 270 capsule 3  . Ibuprofen (MOTRIN PO) Take 200 mg by mouth as needed.     . lamoTRIgine (LAMICTAL) 200 MG tablet Take 1 tablet (200 mg total) by mouth 2 (two) times daily. 180 tablet 0  . LORazepam (ATIVAN) 2 MG tablet Take 1 tablet (2 mg total) by mouth 2 (two) times daily. 60 tablet 1  .  Melatonin 1 MG TABS Take 1 mg by mouth at bedtime as needed (insomnia).    . Multiple Vitamins-Minerals (MULTIVITAMIN PO) Take 1 tablet by mouth daily.    . Norethin Ace-Eth Estrad-FE (TAYTULLA) 1-20 MG-MCG(24) CAPS Take 1 tablet by mouth daily. 28 capsule 0  . nortriptyline (PAMELOR) 10 MG capsule Take 3 capsules (30 mg total) by mouth at bedtime. 270 capsule 0  . Omega-3 Fatty Acids (FISH OIL PO) Take by mouth.    Marland Kitchen omeprazole (PRILOSEC) 40 MG capsule Take 1 capsule (40 mg total) by mouth 2 (two) times daily. 60 capsule 3  . ondansetron (ZOFRAN-ODT) 8 MG disintegrating tablet Take 1 tablet (8 mg total) by mouth every 6 (six) hours as needed for nausea or vomiting. 60 tablet 1  . polyethylene glycol (MIRALAX) packet Take 17 g by mouth daily as needed. 14 each 0  . SUMAtriptan (IMITREX) 100 MG tablet Take 1 tablet (100 mg total) by mouth 2 (two) times daily as needed for up to 1 dose for migraine. 10 tablet 3  . topiramate (TOPAMAX) 25 MG tablet Take one tablet at night for one week, then take 2 tablets at night for one week, then take 3 tablets at night. 90 tablet 3  . metoCLOPramide (REGLAN) 5 MG tablet TAKE 1  TABLET (5 MG TOTAL) BY MOUTH 3 (THREE) TIMES DAILY AS NEEDED FOR NAUSEA. (Patient not taking: Reported on 10/01/2019) 90 tablet 1   No current facility-administered medications on file prior to visit.    Allergies  Allergen Reactions  . Gluten Meal     Social History:  reports that she has never smoked. She has never used smokeless tobacco. She reports that she does not drink alcohol or use drugs.  Family History  Problem Relation Age of Onset  . Depression Father   . Anxiety disorder Sister   . Bipolar disorder Sister   . Diabetes Maternal Grandfather   . Cancer Maternal Grandfather   . Breast cancer Paternal Grandmother   . Uterine cancer Paternal Grandmother   . Irritable bowel syndrome Paternal Grandmother   . Colon cancer Neg Hx   . Esophageal cancer Neg Hx   . Stomach  cancer Neg Hx   . Rectal cancer Neg Hx     The following portions of the patient's history were reviewed and updated as appropriate: allergies, current medications, past family history, past medical history, past social history, past surgical history and problem list.  Review of Systems Pertinent items noted in HPI and remainder of comprehensive ROS otherwise negative.  Physical Exam:  BP 104/73   Pulse 71   Resp 16   Ht 5\' 2"  (1.575 m)   Wt 142 lb (64.4 kg)   LMP 09/11/2019   BMI 25.97 kg/m  CONSTITUTIONAL: Well-developed, well-nourished female in no acute distress.  HENT:  Normocephalic, atraumatic, External right and left ear normal. Oropharynx is clear and moist EYES: Conjunctivae and EOM are normal. Pupils are equal, round, and reactive to light. No scleral icterus.  NECK: Normal range of motion, supple, no masses.  Normal thyroid.  SKIN: Skin is warm and dry. No rash noted. Not diaphoretic. No erythema. No pallor. MUSCULOSKELETAL: Normal range of motion. No tenderness.  No cyanosis, clubbing, or edema.  2+ distal pulses. NEUROLOGIC: Alert and oriented to person, place, and time. Normal reflexes, muscle tone coordination.  PSYCHIATRIC: Normal mood and affect. Normal behavior. Normal judgment and thought content. CARDIOVASCULAR: Normal heart rate noted, regular rhythm RESPIRATORY: Clear to auscultation bilaterally. Effort and breath sounds normal, no problems with respiration noted. BREASTS: Symmetric in size. No masses, tenderness, skin changes, nipple drainage, or lymphadenopathy bilaterally. Performed in the presence of a chaperone. ABDOMEN: Soft, no distention noted.  No tenderness, rebound or guarding.  PELVIC: Normal appearing external genitalia and urethral meatus; normal appearing vaginal mucosa and cervix.  No abnormal discharge noted.  Pap smear obtained.  Normal uterine size, no other palpable masses, no uterine or adnexal tenderness.  Performed in the presence of a  chaperone. Child size speculum used.    Assessment and Plan:  1. Well woman exam with routine gynecological exam  - Cytology - PAP( [place]) - Patient was premedicated with Valium and Flexeril. Mother was present for exam. Patient has Autism and panic attacks. She is considering a Larc for [facility] and management of periods. Would consider IUD if she could have pre medication. Will Rx Bc pills x 1 year. If she decides on a larc she can call the office.  Recommend LARC vs. OCP d/t seizure disorder.   Will follow up results of pap smear and manage accordingly. Routine preventative health maintenance measures emphasized. Please refer to After Visit Summary for other counseling recommendations.    Merita Hawks, Artist Pais, [place] for [employer], [facility]

## 2019-10-04 ENCOUNTER — Telehealth: Payer: Self-pay | Admitting: *Deleted

## 2019-10-04 ENCOUNTER — Other Ambulatory Visit: Payer: Self-pay | Admitting: *Deleted

## 2019-10-04 DIAGNOSIS — N926 Irregular menstruation, unspecified: Secondary | ICD-10-CM

## 2019-10-04 MED ORDER — NORETHIN ACE-ETH ESTRAD-FE 1-20 MG-MCG(24) PO CAPS: 1.0000 | ORAL_CAPSULE | Freq: Every day | ORAL | 11 refills | Status: DC

## 2019-10-04 NOTE — Telephone Encounter (Signed)
PHarmacy called regarding Pt's OCP RX.  There was missing info on the RX.

## 2019-10-05 ENCOUNTER — Ambulatory Visit (INDEPENDENT_AMBULATORY_CARE_PROVIDER_SITE_OTHER): Payer: No Typology Code available for payment source | Admitting: Clinical

## 2019-10-05 DIAGNOSIS — F840 Autistic disorder: Secondary | ICD-10-CM

## 2019-10-05 LAB — CYTOLOGY - PAP: Diagnosis: NEGATIVE

## 2019-10-07 ENCOUNTER — Other Ambulatory Visit (HOSPITAL_COMMUNITY): Payer: Self-pay | Admitting: Psychiatry

## 2019-10-07 ENCOUNTER — Telehealth (HOSPITAL_COMMUNITY): Payer: No Typology Code available for payment source | Admitting: Psychiatry

## 2019-10-07 ENCOUNTER — Other Ambulatory Visit: Payer: Self-pay

## 2019-10-07 ENCOUNTER — Telehealth (HOSPITAL_COMMUNITY): Payer: Self-pay | Admitting: Psychiatry

## 2019-10-07 DIAGNOSIS — F410 Panic disorder [episodic paroxysmal anxiety] without agoraphobia: Secondary | ICD-10-CM

## 2019-10-07 DIAGNOSIS — F3181 Bipolar II disorder: Secondary | ICD-10-CM

## 2019-10-07 DIAGNOSIS — F4010 Social phobia, unspecified: Secondary | ICD-10-CM

## 2019-10-07 MED ORDER — ARIPIPRAZOLE 2 MG PO TABS: 1.0000 mg | ORAL_TABLET | Freq: Every day | ORAL | 0 refills | Status: DC

## 2019-10-07 MED ORDER — LORAZEPAM 2 MG PO TABS: 2.0000 mg | ORAL_TABLET | Freq: Two times a day (BID) | ORAL | 0 refills | Status: DC

## 2019-10-07 MED ORDER — FLUOXETINE HCL 20 MG PO CAPS: 60.0000 mg | ORAL_CAPSULE | Freq: Every day | ORAL | 0 refills | Status: DC

## 2019-10-07 MED ORDER — MELATONIN 1 MG PO TABS: 1.0000 mg | ORAL_TABLET | Freq: Every evening | ORAL | 0 refills | Status: AC | PRN

## 2019-10-07 MED ORDER — LAMOTRIGINE 200 MG PO TABS: 200.0000 mg | ORAL_TABLET | Freq: Two times a day (BID) | ORAL | 0 refills | Status: DC

## 2019-10-07 MED ORDER — NORTRIPTYLINE HCL 10 MG PO CAPS: 30.0000 mg | ORAL_CAPSULE | Freq: Every day | ORAL | 0 refills | Status: DC

## 2019-10-07 NOTE — Progress Notes (Signed)
Missed appointment. Pt has rescheduled and will be seen in 2 weeks. Refilled meds today

## 2019-10-07 NOTE — Telephone Encounter (Signed)
I called both numbers listed on the chart during our scheduled appointment time. There was no answer and I left VM on both. I was unable to speak to [facility] today.

## 2019-10-11 ENCOUNTER — Ambulatory Visit: Payer: No Typology Code available for payment source | Admitting: Clinical

## 2019-10-11 ENCOUNTER — Ambulatory Visit (INDEPENDENT_AMBULATORY_CARE_PROVIDER_SITE_OTHER): Payer: No Typology Code available for payment source | Admitting: Clinical

## 2019-10-11 DIAGNOSIS — F840 Autistic disorder: Secondary | ICD-10-CM | POA: Diagnosis not present

## 2019-10-12 ENCOUNTER — Ambulatory Visit: Payer: No Typology Code available for payment source | Admitting: Clinical

## 2019-10-19 ENCOUNTER — Ambulatory Visit (INDEPENDENT_AMBULATORY_CARE_PROVIDER_SITE_OTHER): Payer: No Typology Code available for payment source | Admitting: Clinical

## 2019-10-19 ENCOUNTER — Other Ambulatory Visit (HOSPITAL_COMMUNITY): Payer: Self-pay | Admitting: Psychiatry

## 2019-10-19 DIAGNOSIS — F410 Panic disorder [episodic paroxysmal anxiety] without agoraphobia: Secondary | ICD-10-CM

## 2019-10-19 DIAGNOSIS — F840 Autistic disorder: Secondary | ICD-10-CM

## 2019-10-19 DIAGNOSIS — F4010 Social phobia, unspecified: Secondary | ICD-10-CM

## 2019-10-25 ENCOUNTER — Ambulatory Visit (INDEPENDENT_AMBULATORY_CARE_PROVIDER_SITE_OTHER): Payer: No Typology Code available for payment source | Admitting: Clinical

## 2019-10-25 DIAGNOSIS — F840 Autistic disorder: Secondary | ICD-10-CM

## 2019-10-26 ENCOUNTER — Ambulatory Visit (INDEPENDENT_AMBULATORY_CARE_PROVIDER_SITE_OTHER): Payer: No Typology Code available for payment source | Admitting: Clinical

## 2019-10-26 DIAGNOSIS — F840 Autistic disorder: Secondary | ICD-10-CM | POA: Diagnosis not present

## 2019-10-28 ENCOUNTER — Other Ambulatory Visit: Payer: Self-pay

## 2019-10-28 ENCOUNTER — Telehealth (INDEPENDENT_AMBULATORY_CARE_PROVIDER_SITE_OTHER): Payer: No Typology Code available for payment source | Admitting: Psychiatry

## 2019-10-28 ENCOUNTER — Encounter (HOSPITAL_COMMUNITY): Payer: Self-pay | Admitting: Psychiatry

## 2019-10-28 DIAGNOSIS — F410 Panic disorder [episodic paroxysmal anxiety] without agoraphobia: Secondary | ICD-10-CM | POA: Diagnosis not present

## 2019-10-28 DIAGNOSIS — F4010 Social phobia, unspecified: Secondary | ICD-10-CM

## 2019-10-28 DIAGNOSIS — F3181 Bipolar II disorder: Secondary | ICD-10-CM

## 2019-10-28 MED ORDER — FLUOXETINE HCL 20 MG PO CAPS: 60.0000 mg | ORAL_CAPSULE | Freq: Every day | ORAL | 0 refills | Status: DC

## 2019-10-28 MED ORDER — LORAZEPAM 2 MG PO TABS: 2.0000 mg | ORAL_TABLET | Freq: Two times a day (BID) | ORAL | 0 refills | Status: DC

## 2019-10-28 MED ORDER — NORTRIPTYLINE HCL 10 MG PO CAPS: 30.0000 mg | ORAL_CAPSULE | Freq: Every day | ORAL | 0 refills | Status: DC

## 2019-10-28 MED ORDER — LAMOTRIGINE 200 MG PO TABS: 200.0000 mg | ORAL_TABLET | Freq: Two times a day (BID) | ORAL | 0 refills | Status: DC

## 2019-10-28 MED ORDER — ARIPIPRAZOLE 2 MG PO TABS: 1.0000 mg | ORAL_TABLET | Freq: Every day | ORAL | 0 refills | Status: DC

## 2019-10-28 NOTE — Progress Notes (Signed)
Virtual Visit via Telephone Note  I connected with Jill Alexander on 10/28/19 at 10:45 AM EDT by telephone and verified that I am speaking with the correct person using two identifiers.  Location: Patient: home Provider: office   I discussed the limitations, risks, security and privacy concerns of performing an evaluation and management service by telephone and the availability of in person appointments. I also discussed with the patient that there may be a patient responsible charge related to this service. The patient expressed understanding and agreed to proceed.   History of Present Illness: Jill Alexander continues to experience mood swings. For a little while it seemed to be getting better so that it was less frequent. During that time her SI decreased in frequency and intensity. Unfortunately it seems like it is getting worse again. She is feeling agitated and is having more frequent SI without plan or intent. It is possibly coming from her brother not sharing his engagement news with her. Her depression is overall unchanged. It is doesn't feel worse. She has some days that are worse than others as is usual. Jill Alexander went to [employer] yesterday and while stressed she was able to handle it. Jill Alexander is noticing that her frustration tolerance has improved a little. She is able to go out more and walk by herself. She is going out several times a week by herself.    Observations/Objective:  General Appearance: unable to assess  Eye Contact:  unable to assess  Speech:  Clear and Coherent and Normal Rate  Volume:  Normal  Mood:  Depressed  Affect:  Blunt  Thought Process:  Coherent and Descriptions of Associations: Circumstantial  Orientation:  Full (Time, Place, and Person)  Thought Content:  Rumination  Suicidal Thoughts:  Yes.  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 Abilify 1mg  po qHS - started at treatment program   2. Lamictal 200 mg p.o. twice daily- increased at treatment program   3. Ativan 2 mg p.o. twice daily as needed anxiety or mood swings    4. Prozac 60mg  po qD for MDD. Discussed Serotonin syndrome in detail along with plan.    5. Nortriptyline 30mg  po qHS for insomnia    At this point we have tried numerous medications with no benefit. I encouraged Jill Alexander to find structure in her day as a way to deal with her depression.    Ketamine- not eligible    ECT- did not find much benefit   She attended the [facility] program for a short period of time in early 2021.   I encouraged her to continue individual therapy (Dr. ) weekly, DBT and supportive employment.    Pt received treatment at a residental treatment facility from Aug 24 Feb 27, 2019.  Patient completed PHP from 7/1-7/14/2020. She then transitioned to [facility] and was d/c on 11/20/2018.  Overall patient reports no benefit from any of these treatment modalities.    Follow Up Instructions: In 6-8 weeks or sooner if needed   I discussed the assessment and treatment plan with the patient. The patient was provided an opportunity to ask questions and all were answered. The patient agreed with the plan and demonstrated an understanding of the instructions.   The patient was advised to call back or  seek an in-person evaluation if the symptoms worsen or if the condition fails to improve as anticipated.  I provided 30 minutes of non-face-to-face time during this encounter.   Oletta Darter, MD

## 2019-11-02 ENCOUNTER — Ambulatory Visit (INDEPENDENT_AMBULATORY_CARE_PROVIDER_SITE_OTHER): Payer: No Typology Code available for payment source | Admitting: Clinical

## 2019-11-02 DIAGNOSIS — F840 Autistic disorder: Secondary | ICD-10-CM | POA: Diagnosis not present

## 2019-11-08 ENCOUNTER — Ambulatory Visit: Payer: No Typology Code available for payment source | Admitting: Clinical

## 2019-11-09 ENCOUNTER — Ambulatory Visit (INDEPENDENT_AMBULATORY_CARE_PROVIDER_SITE_OTHER): Payer: No Typology Code available for payment source | Admitting: Clinical

## 2019-11-09 DIAGNOSIS — F840 Autistic disorder: Secondary | ICD-10-CM

## 2019-11-18 ENCOUNTER — Other Ambulatory Visit: Payer: Self-pay | Admitting: Neurology

## 2019-11-30 ENCOUNTER — Ambulatory Visit (INDEPENDENT_AMBULATORY_CARE_PROVIDER_SITE_OTHER): Payer: No Typology Code available for payment source | Admitting: Clinical

## 2019-11-30 DIAGNOSIS — F840 Autistic disorder: Secondary | ICD-10-CM

## 2019-12-02 ENCOUNTER — Other Ambulatory Visit: Payer: Self-pay

## 2019-12-02 ENCOUNTER — Telehealth (INDEPENDENT_AMBULATORY_CARE_PROVIDER_SITE_OTHER): Payer: No Typology Code available for payment source | Admitting: Psychiatry

## 2019-12-02 DIAGNOSIS — F3181 Bipolar II disorder: Secondary | ICD-10-CM | POA: Diagnosis not present

## 2019-12-02 DIAGNOSIS — F410 Panic disorder [episodic paroxysmal anxiety] without agoraphobia: Secondary | ICD-10-CM | POA: Diagnosis not present

## 2019-12-02 DIAGNOSIS — F4010 Social phobia, unspecified: Secondary | ICD-10-CM

## 2019-12-02 MED ORDER — NORTRIPTYLINE HCL 10 MG PO CAPS: 30.0000 mg | ORAL_CAPSULE | Freq: Every day | ORAL | 0 refills | Status: DC

## 2019-12-02 MED ORDER — LORAZEPAM 2 MG PO TABS: 2.0000 mg | ORAL_TABLET | Freq: Two times a day (BID) | ORAL | 1 refills | Status: DC

## 2019-12-02 MED ORDER — ARIPIPRAZOLE 2 MG PO TABS: 1.0000 mg | ORAL_TABLET | Freq: Every day | ORAL | 0 refills | Status: DC

## 2019-12-02 MED ORDER — FLUOXETINE HCL 20 MG PO CAPS: 60.0000 mg | ORAL_CAPSULE | Freq: Every day | ORAL | 0 refills | Status: DC

## 2019-12-02 MED ORDER — LAMOTRIGINE 200 MG PO TABS: 200.0000 mg | ORAL_TABLET | Freq: Two times a day (BID) | ORAL | 0 refills | Status: DC

## 2019-12-02 NOTE — Progress Notes (Signed)
Virtual Visit via Telephone Note  I connected with [name] on 12/02/19 at 10:30 AM EDT by telephone and verified that I am speaking with the correct person using two identifiers.  Location: Patient: home Provider: office   I discussed the limitations, risks, security and privacy concerns of performing an evaluation and management service by telephone and the availability of in person appointments. I also discussed with the patient that there may be a patient responsible charge related to this service. The patient expressed understanding and agreed to proceed.   History of Present Illness: [name] reports that she went several weeks without therapy and DBT. Due to the lack of she had a hard time with her mood. [name] feels her depression is getting worse. She has low motivation and has not wanted to go out as much. She is sleeping more which is usually a sign of stress. [name] does admit that she is not as bad as she was as last year at this time. She continues to have mood swings and a lot of irritability. Her desire to socialize has decreased. She denies SI/HI. She has random, passing thoughts of death.    Observations/Objective:  General Appearance: unable to assess  Eye Contact:  unable to assess  Speech:  Slow and monotone  Volume:  Normal  Mood:  Anxious and Depressed  Affect:  Congruent  Thought Process:  Coherent and Descriptions of Associations: Circumstantial  Orientation:  Full (Time, Place, and Person)  Thought Content:  Logical  Suicidal Thoughts:  No  Homicidal Thoughts:  No  Memory:  Immediate;   Good  Judgement:  Good  Insight:  Good  Psychomotor Activity: unable to assess  Concentration:  Concentration: Good  Recall:  Good  Fund of Knowledge:  Good  Language:  Good  Akathisia:  unable to assess  Handed:  Right  AIMS (if indicated):     Assets:  Communication Skills Desire for Improvement Financial Resources/Insurance Housing Social  Support Talents/Skills Vocational/Educational  ADL's:  unable to assess  Cognition:  WNL  Sleep:        I reviewed the information below on December 02, 2019 and have updated it Assessment and Plan:  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 She will work on making a more structured day to help with depression. We will wait to make any med changes.    1. Abilify 1mg  po qHS - started at treatment program   2. Lamictal 200 mg p.o. twice daily- increased at treatment program   3. Ativan 2 mg p.o. twice daily as needed anxiety or mood swings    4. Prozac 60mg  po qD for MDD. Discussed Serotonin syndrome in detail along with plan.    5. Nortriptyline 30mg  po qHS for insomnia    At this point we have tried numerous medications with no benefit. I encouraged [name] to find structure in her day as a way to deal with her depression.    Ketamine- not eligible    ECT- did not find much benefit   She attended the [facility] program for a short period of time in early 2021.   I encouraged her to continue individual therapy (Dr. ) weekly, DBT (weekly on [employer])  and supportive employment.    Pt [facility] treatment at a  [facility] treatment [facility] from Aug 24 Feb 27, 2019.  Patient completed PHP from 7/1-7/14/2020. She then transitioned to [facility] and was d/c on 11/20/2018.  Overall patient reports no benefit from any of these treatment modalities.    Follow Up Instructions: In 6-8 weeks or sooner if needed   I discussed the assessment and treatment plan with the patient. The patient was provided an opportunity to ask questions and all were answered. The patient agreed with the plan and demonstrated an understanding of the instructions.   The patient was advised  to call back or seek an in-person evaluation if the symptoms worsen or if the condition fails to improve as anticipated.  I provided 30 minutes of non-face-to-face time during this encounter.   Oletta Darter, MD

## 2019-12-03 ENCOUNTER — Other Ambulatory Visit: Payer: Self-pay | Admitting: Neurology

## 2019-12-06 ENCOUNTER — Ambulatory Visit: Payer: No Typology Code available for payment source | Admitting: Clinical

## 2019-12-07 ENCOUNTER — Ambulatory Visit (INDEPENDENT_AMBULATORY_CARE_PROVIDER_SITE_OTHER): Payer: No Typology Code available for payment source | Admitting: Clinical

## 2019-12-07 DIAGNOSIS — F840 Autistic disorder: Secondary | ICD-10-CM

## 2019-12-09 MED FILL — NORTRIPTYLINE HCL 10 MG CAP: 10 | 90 days supply | Qty: 270 | Fill #0

## 2019-12-13 ENCOUNTER — Other Ambulatory Visit (HOSPITAL_COMMUNITY): Payer: Self-pay | Admitting: Psychiatry

## 2019-12-13 DIAGNOSIS — F410 Panic disorder [episodic paroxysmal anxiety] without agoraphobia: Secondary | ICD-10-CM

## 2019-12-13 DIAGNOSIS — F4010 Social phobia, unspecified: Secondary | ICD-10-CM

## 2019-12-14 ENCOUNTER — Ambulatory Visit (INDEPENDENT_AMBULATORY_CARE_PROVIDER_SITE_OTHER): Payer: No Typology Code available for payment source | Admitting: Clinical

## 2019-12-14 DIAGNOSIS — F840 Autistic disorder: Secondary | ICD-10-CM

## 2019-12-16 ENCOUNTER — Other Ambulatory Visit (HOSPITAL_COMMUNITY): Payer: Self-pay | Admitting: Psychiatry

## 2019-12-20 ENCOUNTER — Ambulatory Visit (INDEPENDENT_AMBULATORY_CARE_PROVIDER_SITE_OTHER): Payer: No Typology Code available for payment source | Admitting: Clinical

## 2019-12-20 DIAGNOSIS — F840 Autistic disorder: Secondary | ICD-10-CM

## 2019-12-21 ENCOUNTER — Ambulatory Visit (INDEPENDENT_AMBULATORY_CARE_PROVIDER_SITE_OTHER): Payer: No Typology Code available for payment source | Admitting: Clinical

## 2019-12-21 DIAGNOSIS — F840 Autistic disorder: Secondary | ICD-10-CM

## 2019-12-28 ENCOUNTER — Ambulatory Visit (INDEPENDENT_AMBULATORY_CARE_PROVIDER_SITE_OTHER): Payer: No Typology Code available for payment source | Admitting: Clinical

## 2019-12-28 DIAGNOSIS — F840 Autistic disorder: Secondary | ICD-10-CM | POA: Diagnosis not present

## 2019-12-31 ENCOUNTER — Other Ambulatory Visit (HOSPITAL_COMMUNITY): Payer: Self-pay | Admitting: Psychiatry

## 2019-12-31 DIAGNOSIS — F3181 Bipolar II disorder: Secondary | ICD-10-CM

## 2020-01-05 ENCOUNTER — Other Ambulatory Visit: Payer: No Typology Code available for payment source

## 2020-01-11 ENCOUNTER — Ambulatory Visit (INDEPENDENT_AMBULATORY_CARE_PROVIDER_SITE_OTHER): Payer: No Typology Code available for payment source | Admitting: Clinical

## 2020-01-11 ENCOUNTER — Other Ambulatory Visit: Payer: Self-pay | Admitting: Neurology

## 2020-01-11 DIAGNOSIS — F840 Autistic disorder: Secondary | ICD-10-CM

## 2020-01-13 ENCOUNTER — Telehealth (HOSPITAL_COMMUNITY): Payer: No Typology Code available for payment source | Admitting: Psychiatry

## 2020-01-13 ENCOUNTER — Other Ambulatory Visit: Payer: Self-pay

## 2020-01-13 ENCOUNTER — Other Ambulatory Visit (HOSPITAL_COMMUNITY): Payer: Self-pay | Admitting: Psychiatry

## 2020-01-13 DIAGNOSIS — F410 Panic disorder [episodic paroxysmal anxiety] without agoraphobia: Secondary | ICD-10-CM

## 2020-01-13 DIAGNOSIS — F4010 Social phobia, unspecified: Secondary | ICD-10-CM

## 2020-01-17 ENCOUNTER — Telehealth (HOSPITAL_COMMUNITY): Payer: Self-pay | Admitting: *Deleted

## 2020-01-17 ENCOUNTER — Other Ambulatory Visit (HOSPITAL_COMMUNITY): Payer: Self-pay | Admitting: Psychiatry

## 2020-01-17 ENCOUNTER — Ambulatory Visit (INDEPENDENT_AMBULATORY_CARE_PROVIDER_SITE_OTHER): Payer: No Typology Code available for payment source | Admitting: Clinical

## 2020-01-17 DIAGNOSIS — F4010 Social phobia, unspecified: Secondary | ICD-10-CM

## 2020-01-17 DIAGNOSIS — F3181 Bipolar II disorder: Secondary | ICD-10-CM

## 2020-01-17 DIAGNOSIS — F840 Autistic disorder: Secondary | ICD-10-CM

## 2020-01-17 DIAGNOSIS — F410 Panic disorder [episodic paroxysmal anxiety] without agoraphobia: Secondary | ICD-10-CM

## 2020-01-17 MED ORDER — LAMOTRIGINE 200 MG PO TABS: 200.0000 mg | ORAL_TABLET | Freq: Two times a day (BID) | ORAL | 0 refills | Status: DC

## 2020-01-17 MED ORDER — LORAZEPAM 2 MG PO TABS: 2.0000 mg | ORAL_TABLET | Freq: Two times a day (BID) | ORAL | 1 refills | Status: DC

## 2020-01-17 MED ORDER — NORTRIPTYLINE HCL 10 MG PO CAPS: 30.0000 mg | ORAL_CAPSULE | Freq: Every day | ORAL | 0 refills | Status: DC

## 2020-01-17 MED ORDER — ARIPIPRAZOLE 2 MG PO TABS: 1.0000 mg | ORAL_TABLET | Freq: Every day | ORAL | 0 refills | Status: DC

## 2020-01-17 MED ORDER — FLUOXETINE HCL 20 MG PO CAPS: 60.0000 mg | ORAL_CAPSULE | Freq: Every day | ORAL | 0 refills | Status: DC

## 2020-01-17 NOTE — Telephone Encounter (Signed)
Pt called requesting "urgent" refill of the Abilify and Ativan. Pt missed appointment on 01/13/20. No future appointment on the books. Will have front desk contact pt. Please review and advise.

## 2020-01-18 ENCOUNTER — Ambulatory Visit (INDEPENDENT_AMBULATORY_CARE_PROVIDER_SITE_OTHER): Payer: No Typology Code available for payment source | Admitting: Clinical

## 2020-01-18 DIAGNOSIS — F840 Autistic disorder: Secondary | ICD-10-CM

## 2020-01-20 ENCOUNTER — Other Ambulatory Visit (HOSPITAL_COMMUNITY): Payer: Self-pay | Admitting: Psychiatry

## 2020-01-20 DIAGNOSIS — F3181 Bipolar II disorder: Secondary | ICD-10-CM

## 2020-01-21 MED FILL — ARIPiprazole 2 MG TABS: 2 | 90 days supply | Qty: 45 | Fill #0

## 2020-01-25 ENCOUNTER — Ambulatory Visit (INDEPENDENT_AMBULATORY_CARE_PROVIDER_SITE_OTHER): Payer: No Typology Code available for payment source | Admitting: Clinical

## 2020-01-25 DIAGNOSIS — F840 Autistic disorder: Secondary | ICD-10-CM | POA: Diagnosis not present

## 2020-01-31 ENCOUNTER — Ambulatory Visit (INDEPENDENT_AMBULATORY_CARE_PROVIDER_SITE_OTHER): Payer: No Typology Code available for payment source | Admitting: Clinical

## 2020-01-31 DIAGNOSIS — F840 Autistic disorder: Secondary | ICD-10-CM

## 2020-02-01 ENCOUNTER — Ambulatory Visit (INDEPENDENT_AMBULATORY_CARE_PROVIDER_SITE_OTHER): Payer: No Typology Code available for payment source | Admitting: Clinical

## 2020-02-01 DIAGNOSIS — F840 Autistic disorder: Secondary | ICD-10-CM | POA: Diagnosis not present

## 2020-02-08 ENCOUNTER — Ambulatory Visit (INDEPENDENT_AMBULATORY_CARE_PROVIDER_SITE_OTHER): Payer: No Typology Code available for payment source | Admitting: Clinical

## 2020-02-08 ENCOUNTER — Other Ambulatory Visit: Payer: Self-pay | Admitting: Emergency Medicine

## 2020-02-08 DIAGNOSIS — F840 Autistic disorder: Secondary | ICD-10-CM | POA: Diagnosis not present

## 2020-02-10 ENCOUNTER — Other Ambulatory Visit: Payer: Self-pay | Admitting: Emergency Medicine

## 2020-02-10 MED ORDER — TOPIRAMATE 25 MG PO TABS: ORAL_TABLET | ORAL | 0 refills | Status: DC

## 2020-02-14 ENCOUNTER — Ambulatory Visit: Payer: No Typology Code available for payment source | Admitting: Clinical

## 2020-02-15 ENCOUNTER — Ambulatory Visit (INDEPENDENT_AMBULATORY_CARE_PROVIDER_SITE_OTHER): Payer: No Typology Code available for payment source | Admitting: Clinical

## 2020-02-15 DIAGNOSIS — F840 Autistic disorder: Secondary | ICD-10-CM

## 2020-02-16 ENCOUNTER — Other Ambulatory Visit (HOSPITAL_COMMUNITY): Payer: Self-pay | Admitting: Psychiatry

## 2020-02-16 MED FILL — FLUoxetine HCL 10 MG CAPS: 10 | 30 days supply | Qty: 90 | Fill #0

## 2020-02-16 MED FILL — LORazepam 1 MG TABS: 1 | 30 days supply | Qty: 60 | Fill #0

## 2020-02-16 MED FILL — lamoTRIgine 200 MG TABS: 200 | 15 days supply | Qty: 30 | Fill #0

## 2020-02-21 ENCOUNTER — Other Ambulatory Visit: Payer: Self-pay

## 2020-02-21 ENCOUNTER — Other Ambulatory Visit: Payer: Self-pay | Admitting: Neurology

## 2020-02-21 ENCOUNTER — Ambulatory Visit (INDEPENDENT_AMBULATORY_CARE_PROVIDER_SITE_OTHER): Payer: No Typology Code available for payment source | Admitting: Neurology

## 2020-02-21 ENCOUNTER — Encounter: Payer: Self-pay | Admitting: Neurology

## 2020-02-21 VITALS — BP 107/75 | HR 86 | Ht 62.0 in | Wt 149.2 lb

## 2020-02-21 DIAGNOSIS — G43019 Migraine without aura, intractable, without status migrainosus: Secondary | ICD-10-CM | POA: Diagnosis not present

## 2020-02-21 MED ORDER — TOPIRAMATE 100 MG PO TABS: 100.0000 mg | ORAL_TABLET | Freq: Every day | ORAL | 3 refills | Status: DC

## 2020-02-21 MED ORDER — RIZATRIPTAN BENZOATE 10 MG PO TABS: 10.0000 mg | ORAL_TABLET | ORAL | 11 refills | Status: DC | PRN

## 2020-02-21 MED FILL — RIZATRIPTAN BENZOATE 10 MG: 10 | 30 days supply | Qty: 10 | Fill #0

## 2020-02-21 MED FILL — TOPIRAMATE 100 MG TABLET: 100 | 90 days supply | Qty: 90 | Fill #0

## 2020-02-21 NOTE — Progress Notes (Signed)
PATIENT: Jill Alexander DOB: February 23, 1996  REASON FOR VISIT: follow up HISTORY FROM: patient  HISTORY OF PRESENT ILLNESS: Today 02/21/20 Jill Alexander is a 24 year old female with history of autistic disorder and migraine headaches.  She is on nortriptyline and Topamax.  She takes Imitrex if needed.  Headaches were very well controlled with the addition of Topamax, they became rare, once a month.  Over the last month or so, has noted an increase in migraines, on average 3-4 a week, usually in the morning.  Feels related to stress, is currently searching for a job, has had a lot of rejections, family stress.  Is seeing a new psychiatrist, is tapering down the dose of Ativan. She remains on Abilify, Prozac, and Lamictal.  Imitrex has not been helpful, incomplete benefit.  Lives with her parents, does not drive a car.  Presents today for evaluation unaccompanied.  Denies side effect of Topamax, does mention some memory loss, hence the reason for decrease of Ativan, but does not feel correlated Topamax.  She started seeing a new psychiatrist.  HISTORY  07/01/2019 Dr. Anne Hahn: [name] is a 24 year old left-handed white female with a history of an autistic disorder.  The patient has had frequent headaches over the last 5 years or so, she claims that she is having headaches about one half of the days of the month.  The patient indicates that the headaches are usually in the frontal areas bilaterally but may spread to all over the head.  The patient may have occasional nausea but no vomiting.  She may experience photophobia and phonophobia with the headache.  She may see spots of light in the eyes with the headache.  She denies any numbness or weakness, but she does have some difficulty focusing when the headache occurs.  She may take Excedrin Migraine if needed.  She was placed on nortriptyline working up to 30 mg at night in October 2020, but the headaches have continued.  She often times will wake up  with headache in the morning.  The patient indicates that her father had severe headaches when he was young.  The patient is sent to this office for further evaluation.  The patient may drink 1 or 2 cups of coffee a day, otherwise she takes in very few other caffeinated products.  She denies any neck stiffness with a headache.  REVIEW OF SYSTEMS: Out of a complete 14 system review of symptoms, the patient complains only of the following symptoms, and all other reviewed systems are negative.  Headache  ALLERGIES: Allergies  Allergen Reactions  . Gluten Meal     HOME MEDICATIONS: Outpatient Medications Prior to Visit  Medication Sig Dispense Refill  . acetaminophen (TYLENOL) 500 MG tablet Take 500 mg by mouth every 6 (six) hours as needed.    . ARIPiprazole (ABILIFY) 2 MG tablet Take 0.5 tablets (1 mg total) by mouth daily. 45 tablet 0  . cyclobenzaprine (FLEXERIL) 10 MG tablet Take 1 tablet (10 mg total) by mouth every 8 (eight) hours as needed for muscle spasms. Take 2 hours prior to exam 1 tablet 0  . Docusate Sodium (COLACE PO) Take by mouth.    . Famotidine (PEPCID PO) Take by mouth.    Marland Kitchen FLUoxetine (PROZAC) 20 MG capsule Take 3 capsules (60 mg total) by mouth daily. 270 capsule 0  . Ibuprofen (MOTRIN PO) Take 200 mg by mouth as needed.     . lamoTRIgine (LAMICTAL) 200 MG tablet Take 1 tablet (200  mg total) by mouth 2 (two) times daily. 180 tablet 0  . LORazepam (ATIVAN) 2 MG tablet Take 1 tablet (2 mg total) by mouth 2 (two) times daily. 60 tablet 1  . melatonin 1 MG TABS tablet Take 1 tablet (1 mg total) by mouth at bedtime as needed (insomnia). 30 tablet 0  . Multiple Vitamins-Minerals (MULTIVITAMIN PO) Take 1 tablet by mouth daily.    . Norethin Ace-Eth Estrad-FE (TAYTULLA) 1-20 MG-MCG(24) CAPS Take 1 tablet by mouth daily. 28 capsule 11  . nortriptyline (PAMELOR) 10 MG capsule Take 3 capsules (30 mg total) by mouth at bedtime. 270 capsule 0  . Omega-3 Fatty Acids (FISH OIL PO)  Take by mouth.    Marland Kitchen omeprazole (PRILOSEC) 40 MG capsule Take 1 capsule (40 mg total) by mouth 2 (two) times daily. 60 capsule 3  . ondansetron (ZOFRAN-ODT) 8 MG disintegrating tablet Take 1 tablet (8 mg total) by mouth every 6 (six) hours as needed for nausea or vomiting. 60 tablet 1  . polyethylene glycol (MIRALAX) packet Take 17 g by mouth daily as needed. 14 each 0  . metoCLOPramide (REGLAN) 5 MG tablet TAKE 1 TABLET (5 MG TOTAL) BY MOUTH 3 (THREE) TIMES DAILY AS NEEDED FOR NAUSEA. (Patient not taking: Reported on 10/01/2019) 90 tablet 1  . SUMAtriptan (IMITREX) 100 MG tablet Take 1 tablet (100 mg total) by mouth 2 (two) times daily as needed for up to 1 dose for migraine. 10 tablet 3  . topiramate (TOPAMAX) 25 MG tablet Must keep appointment on 10/25 at 1:15 for further refill.s 90 tablet 0   No facility-administered medications prior to visit.    PAST MEDICAL HISTORY: Past Medical History:  Diagnosis Date  . Anxiety   . Autism   . Chronic nausea   . Common migraine with intractable migraine 07/01/2019  . Depression   . Dyspepsia   . GERD (gastroesophageal reflux disease)   . Headache   . Heart murmur   . Seizures (HCC)   . Tremor, 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    PAST SURGICAL HISTORY: Past Surgical History:  Procedure Laterality Date  . none      FAMILY HISTORY: Family History  Problem Relation Age of Onset  . Depression Father   . Anxiety disorder Sister   . Bipolar disorder Sister   . Diabetes Maternal Grandfather   . Cancer Maternal Grandfather   . Breast cancer Paternal Grandmother   . Uterine cancer Paternal Grandmother   . Irritable bowel syndrome Paternal Grandmother   . Colon cancer Neg Hx   . Esophageal cancer Neg Hx   . Stomach cancer Neg Hx   . Rectal cancer Neg Hx     SOCIAL HISTORY: Social History   Socioeconomic History  . Marital status: Single    Spouse name: Not on file  . Number of children: 0  . Years of education: Not on file  . Highest education  level: Some college, no degree  Occupational History  . Not on file  Tobacco Use  . Smoking status: Never Smoker  . Smokeless tobacco: Never Used  Vaping Use  . Vaping Use: Never used  Substance and Sexual Activity  . Alcohol use: No  . Drug use: No  . Sexual activity: Not Currently    Birth control/protection: Pill  Other Topics Concern  . Not on file  Social History Narrative   Lives with parents in [place]. Pt moved here from [place] in 2018.   Caffeine use: 2-3 cups per day  Left handed    Social Determinants of Health   Financial Resource Strain:   . Difficulty of Paying Living Expenses: Not on file  Food Insecurity:   . Worried About Programme researcher, broadcasting/film/video in the Last Year: Not on file  . Ran Out of Food in the Last Year: Not on file  Transportation Needs:   . Lack of Transportation (Medical): Not on file  . Lack of Transportation (Non-Medical): Not on file  Physical Activity:   . Days of Exercise per Week: Not on file  . Minutes of Exercise per Session: Not on file  Stress:   . Feeling of Stress : Not on file  Social Connections:   . Frequency of Communication with Friends and Family: Not on file  . Frequency of Social Gatherings with Friends and Family: Not on file  . Attends Religious Services: Not on file  . Active Member of Clubs or Organizations: Not on file  . Attends Banker Meetings: Not on file  . Marital Status: Not on file  Intimate Partner Violence:   . Fear of Current or Ex-Partner: Not on file  . Emotionally Abused: Not on file  . Physically Abused: Not on file  . Sexually Abused: Not on file   PHYSICAL EXAM  Vitals:   02/21/20 1315  BP: 107/75  Pulse: 86  Weight: 149 lb 3.2 oz (67.7 kg)  Height: 5\' 2"  (1.575 m)   Body mass index is 27.29 kg/m.  Generalized: Well developed, in no acute distress   Neurological examination  Mentation: Alert oriented to time, place, history taking. Follows all commands speech and language  fluent Cranial nerve II-XII: Pupils were equal round reactive to light. Extraocular movements were full, visual field were full on confrontational test. Facial sensation and strength were normal. Head turning and shoulder shrug  were normal and symmetric. Motor: The motor testing reveals 5 over 5 strength of all 4 extremities. Good symmetric motor tone is noted throughout.  Sensory: Sensory testing is intact to soft touch on all 4 extremities. No evidence of extinction is noted.  Coordination: Cerebellar testing reveals good finger-nose-finger and heel-to-shin bilaterally.  Fine action type tremors are noted with bilateral upper extremities. Gait and station: Gait is normal. Tandem gait is normal. Romberg is negative. No drift is seen.  Reflexes: Deep tendon reflexes are symmetric and normal bilaterally.   DIAGNOSTIC DATA (LABS, IMAGING, TESTING) - I reviewed patient records, labs, notes, testing and imaging myself where available.  Lab Results  Component Value Date   WBC 5.1 05/13/2019   HGB 13.2 05/13/2019   HCT 40.2 05/13/2019   MCV 92 05/13/2019   PLT 269 05/13/2019      Component Value Date/Time   NA 142 05/13/2019 1015   K 4.6 05/13/2019 1015   CL 106 05/13/2019 1015   CO2 22 05/13/2019 1015   GLUCOSE 87 05/13/2019 1015   GLUCOSE 83 07/01/2018 1537   BUN 11 05/13/2019 1015   CREATININE 0.68 05/13/2019 1015   CALCIUM 9.4 05/13/2019 1015   PROT 6.5 05/13/2019 1015   ALBUMIN 4.4 05/13/2019 1015   AST 20 05/13/2019 1015   ALT 16 05/13/2019 1015   ALKPHOS 59 05/13/2019 1015   BILITOT 0.2 05/13/2019 1015   GFRNONAA 122 05/13/2019 1015   GFRAA 141 05/13/2019 1015   No results found for: CHOL, HDL, LDLCALC, LDLDIRECT, TRIG, CHOLHDL Lab Results  Component Value Date   HGBA1C 5.2 07/14/2019   No results found for: 07/16/2019 Lab  Results  Component Value Date   TSH 0.650 05/13/2019    ASSESSMENT AND PLAN 24 y.o. year old female  has a past medical history of Anxiety,  Autism, Chronic nausea, Common migraine with intractable migraine (07/01/2019), Depression, Dyspepsia, GERD (gastroesophageal reflux disease), Headache, Heart murmur, Seizures (HCC), and Tremor, essential (04/09/2017). here with:  1.  Intractable migraine headache -Headaches were well controlled with Topamax, has had an increase over the last month, 3-4 migraines a week -Increase Topamax 100 mg at bedtime -Is on nortriptyline 30 mg from psychiatry -Incomplete benefit with Imitrex, stop this -Try Maxalt 10 mg as needed for acute headache -Call for dose adjustment, follow-up 6 months or sooner if needed  I spent 20 minutes of face-to-face and non-face-to-face time with patient.  This included previsit chart review, lab review, study review, order entry, electronic health record documentation, patient education.  Margie EgeSarah Samayra Hebel, AGNP-C, DNP 02/21/2020, 1:46 PM [facility] [address], [address] [place], [place] [id number] [phone]

## 2020-02-21 NOTE — Progress Notes (Signed)
I have read the note, and I agree with the clinical assessment and plan.  Makail Watling K Toshika Parrow   

## 2020-02-21 NOTE — Patient Instructions (Signed)
Increase Topamax 100 mg at bedtime Stop the Imitrex and try Maxalt for acute headache Follow-up in 6 months, call for dose adjustment

## 2020-02-22 ENCOUNTER — Ambulatory Visit: Payer: No Typology Code available for payment source | Admitting: Clinical

## 2020-02-24 ENCOUNTER — Encounter: Payer: Self-pay | Admitting: Family Medicine

## 2020-02-28 ENCOUNTER — Ambulatory Visit: Payer: No Typology Code available for payment source | Admitting: Clinical

## 2020-02-29 ENCOUNTER — Ambulatory Visit (INDEPENDENT_AMBULATORY_CARE_PROVIDER_SITE_OTHER): Payer: No Typology Code available for payment source | Admitting: Clinical

## 2020-02-29 DIAGNOSIS — F840 Autistic disorder: Secondary | ICD-10-CM

## 2020-03-07 ENCOUNTER — Ambulatory Visit (INDEPENDENT_AMBULATORY_CARE_PROVIDER_SITE_OTHER): Payer: Medicaid Other | Admitting: Clinical

## 2020-03-07 DIAGNOSIS — F840 Autistic disorder: Secondary | ICD-10-CM

## 2020-03-13 ENCOUNTER — Ambulatory Visit (INDEPENDENT_AMBULATORY_CARE_PROVIDER_SITE_OTHER): Payer: No Typology Code available for payment source | Admitting: Clinical

## 2020-03-13 DIAGNOSIS — F840 Autistic disorder: Secondary | ICD-10-CM

## 2020-03-14 ENCOUNTER — Ambulatory Visit (INDEPENDENT_AMBULATORY_CARE_PROVIDER_SITE_OTHER): Payer: No Typology Code available for payment source | Admitting: Clinical

## 2020-03-14 DIAGNOSIS — F840 Autistic disorder: Secondary | ICD-10-CM | POA: Diagnosis not present

## 2020-03-15 MED FILL — lamoTRIgine 200 MG TABS: 200 | 15 days supply | Qty: 30 | Fill #1

## 2020-03-20 ENCOUNTER — Other Ambulatory Visit (HOSPITAL_COMMUNITY): Payer: Self-pay | Admitting: Psychiatry

## 2020-03-20 DIAGNOSIS — F3181 Bipolar II disorder: Secondary | ICD-10-CM

## 2020-03-20 MED FILL — NORTRIPTYLINE HCL 10 MG CAP: 10 | 30 days supply | Qty: 90 | Fill #0

## 2020-03-20 MED FILL — LORazepam 1 MG TABS: 1 | 30 days supply | Qty: 60 | Fill #1

## 2020-03-20 MED FILL — RIZATRIPTAN BENZOATE 10 MG: 10 | 30 days supply | Qty: 10 | Fill #1

## 2020-03-21 ENCOUNTER — Ambulatory Visit: Payer: No Typology Code available for payment source | Admitting: Clinical

## 2020-03-27 ENCOUNTER — Ambulatory Visit (INDEPENDENT_AMBULATORY_CARE_PROVIDER_SITE_OTHER): Payer: No Typology Code available for payment source | Admitting: Clinical

## 2020-03-27 DIAGNOSIS — F840 Autistic disorder: Secondary | ICD-10-CM

## 2020-03-28 ENCOUNTER — Ambulatory Visit (INDEPENDENT_AMBULATORY_CARE_PROVIDER_SITE_OTHER): Payer: No Typology Code available for payment source | Admitting: Clinical

## 2020-03-28 DIAGNOSIS — F840 Autistic disorder: Secondary | ICD-10-CM

## 2020-03-29 ENCOUNTER — Other Ambulatory Visit: Payer: Self-pay | Admitting: Obstetrics and Gynecology

## 2020-03-29 ENCOUNTER — Telehealth: Payer: Self-pay | Admitting: *Deleted

## 2020-03-29 DIAGNOSIS — N926 Irregular menstruation, unspecified: Secondary | ICD-10-CM

## 2020-03-29 MED ORDER — NORETHIN ACE-ETH ESTRAD-FE 1-20 MG-MCG(24) PO CAPS: 1.0000 | ORAL_CAPSULE | Freq: Every day | ORAL | 11 refills | Status: DC

## 2020-03-29 MED FILL — TAYTULLA 1 MG-20 MCG CAP: 1-20 | 28 days supply | Qty: 28 | Fill #0

## 2020-03-29 NOTE — Telephone Encounter (Signed)
Returned call from 10:25 AM. Left patient a message that she has 11 refills on her birth control pills written 10/04/2019. Advised to call the pharmacy, number provided and for them to fax [place] a refill request if needed, number provided.

## 2020-03-31 ENCOUNTER — Ambulatory Visit: Payer: No Typology Code available for payment source

## 2020-04-03 MED FILL — ARIPiprazole 2 MG TABS: 2 | 30 days supply | Qty: 15 | Fill #0

## 2020-04-04 ENCOUNTER — Ambulatory Visit (INDEPENDENT_AMBULATORY_CARE_PROVIDER_SITE_OTHER): Payer: No Typology Code available for payment source | Admitting: Clinical

## 2020-04-04 DIAGNOSIS — F840 Autistic disorder: Secondary | ICD-10-CM

## 2020-04-06 ENCOUNTER — Other Ambulatory Visit (HOSPITAL_COMMUNITY): Payer: Self-pay | Admitting: Psychiatry

## 2020-04-06 MED FILL — lamoTRIgine 200 MG TABS: 200 | 30 days supply | Qty: 60 | Fill #0

## 2020-04-10 ENCOUNTER — Ambulatory Visit (INDEPENDENT_AMBULATORY_CARE_PROVIDER_SITE_OTHER): Payer: No Typology Code available for payment source | Admitting: Clinical

## 2020-04-10 DIAGNOSIS — F840 Autistic disorder: Secondary | ICD-10-CM

## 2020-04-11 ENCOUNTER — Ambulatory Visit (INDEPENDENT_AMBULATORY_CARE_PROVIDER_SITE_OTHER): Payer: No Typology Code available for payment source | Admitting: Clinical

## 2020-04-11 DIAGNOSIS — F840 Autistic disorder: Secondary | ICD-10-CM

## 2020-04-13 ENCOUNTER — Other Ambulatory Visit (HOSPITAL_COMMUNITY): Payer: Self-pay | Admitting: Psychiatry

## 2020-04-13 MED FILL — FLUoxetine HCL 10 MG CAPS: 10 | 30 days supply | Qty: 90 | Fill #0

## 2020-04-18 ENCOUNTER — Ambulatory Visit: Payer: No Typology Code available for payment source | Admitting: Clinical

## 2020-04-19 MED FILL — LORazepam 1 MG TABS: 1 | 30 days supply | Qty: 60 | Fill #0

## 2020-04-19 MED FILL — RIZATRIPTAN BENZOATE 10 MG: 10 | 30 days supply | Qty: 10 | Fill #2

## 2020-04-19 MED FILL — NORTRIPTYLINE HCL 10 MG CAP: 10 | 30 days supply | Qty: 90 | Fill #1

## 2020-04-24 ENCOUNTER — Encounter: Payer: Self-pay | Admitting: Family Medicine

## 2020-04-24 ENCOUNTER — Ambulatory Visit (INDEPENDENT_AMBULATORY_CARE_PROVIDER_SITE_OTHER): Payer: No Typology Code available for payment source | Admitting: Family Medicine

## 2020-04-24 VITALS — BP 118/72 | HR 78 | Temp 98.4°F | Wt 146.6 lb

## 2020-04-24 DIAGNOSIS — H9193 Unspecified hearing loss, bilateral: Secondary | ICD-10-CM

## 2020-04-24 MED FILL — TAYTULLA 1 MG-20 MCG CAP: 1-20 | 28 days supply | Qty: 28 | Fill #1

## 2020-04-24 NOTE — Progress Notes (Signed)
° °  Subjective:    Patient ID: Jill Alexander, female    DOB: [birth year], 24 y.o.   MRN: [id number]  HPI Chief Complaint  Patient presents with   other    Hearing issues couple of weeks   Complains of hearing issues for the past 2 weeks- months. States her mother sounds muffled now when she has not in the past. She has noticed having to turn up her headphones to listen to podcasts recently.   Denies fever, chills, headaches, dizziness, ear pain, nasal congestion, rhinorrhea, tinnitus, sore throat.   Reviewed allergies, medications, past medical, surgical, family, and social history.   Review of Systems Pertinent positives and negatives in the history of present illness.     Objective:   Physical Exam BP 118/72    Pulse 78    Temp 98.4 F (36.9 C)    Wt 146 lb 9.6 oz (66.5 kg)    BMI 26.81 kg/m   Alert and in no distress. Tympanic membranes and canals are normal. Gross hearing decreased.  Neck is supple without adenopathy or thyromegaly.       Assessment & Plan:  Decreased hearing of both ears - Plan: Ambulatory referral to ENT  Hearing deficit on exam, left worse than right particularly at a higher frequency.  Discussed good ear protection and avoid loud noise on headphones. Referral to ENT for further evaluation.

## 2020-04-25 ENCOUNTER — Ambulatory Visit: Payer: No Typology Code available for payment source | Admitting: Clinical

## 2020-04-27 MED FILL — ARIPiprazole 2 MG TABS: 2 | 30 days supply | Qty: 15 | Fill #0

## 2020-05-01 MED FILL — lamoTRIgine 200 MG TABS: 200 | 30 days supply | Qty: 60 | Fill #1

## 2020-05-02 ENCOUNTER — Ambulatory Visit (INDEPENDENT_AMBULATORY_CARE_PROVIDER_SITE_OTHER): Payer: Medicaid Other | Admitting: Clinical

## 2020-05-02 DIAGNOSIS — F840 Autistic disorder: Secondary | ICD-10-CM | POA: Diagnosis not present

## 2020-05-08 ENCOUNTER — Ambulatory Visit (INDEPENDENT_AMBULATORY_CARE_PROVIDER_SITE_OTHER): Payer: Medicaid Other | Admitting: Clinical

## 2020-05-08 DIAGNOSIS — F840 Autistic disorder: Secondary | ICD-10-CM | POA: Diagnosis not present

## 2020-05-08 MED FILL — TOPIRAMATE 100 MG TABLET: 100 | 90 days supply | Qty: 90 | Fill #1

## 2020-05-08 MED FILL — FLUoxetine HCL 10 MG CAPS: 10 | 30 days supply | Qty: 90 | Fill #1

## 2020-05-09 ENCOUNTER — Ambulatory Visit (INDEPENDENT_AMBULATORY_CARE_PROVIDER_SITE_OTHER): Payer: Medicaid Other | Admitting: Clinical

## 2020-05-09 DIAGNOSIS — F840 Autistic disorder: Secondary | ICD-10-CM | POA: Diagnosis not present

## 2020-05-15 IMAGING — CR DG CHEST 2V
1 series · 2 of 2 positions shown · non-contrast
Comparison: None.

CLINICAL DATA: Preop evaluation

EXAM:
CHEST - 2 VIEW

[Series 1: w chest pa · 0.14mm/px · 2 of 2 slices shown]
[im 1/2]
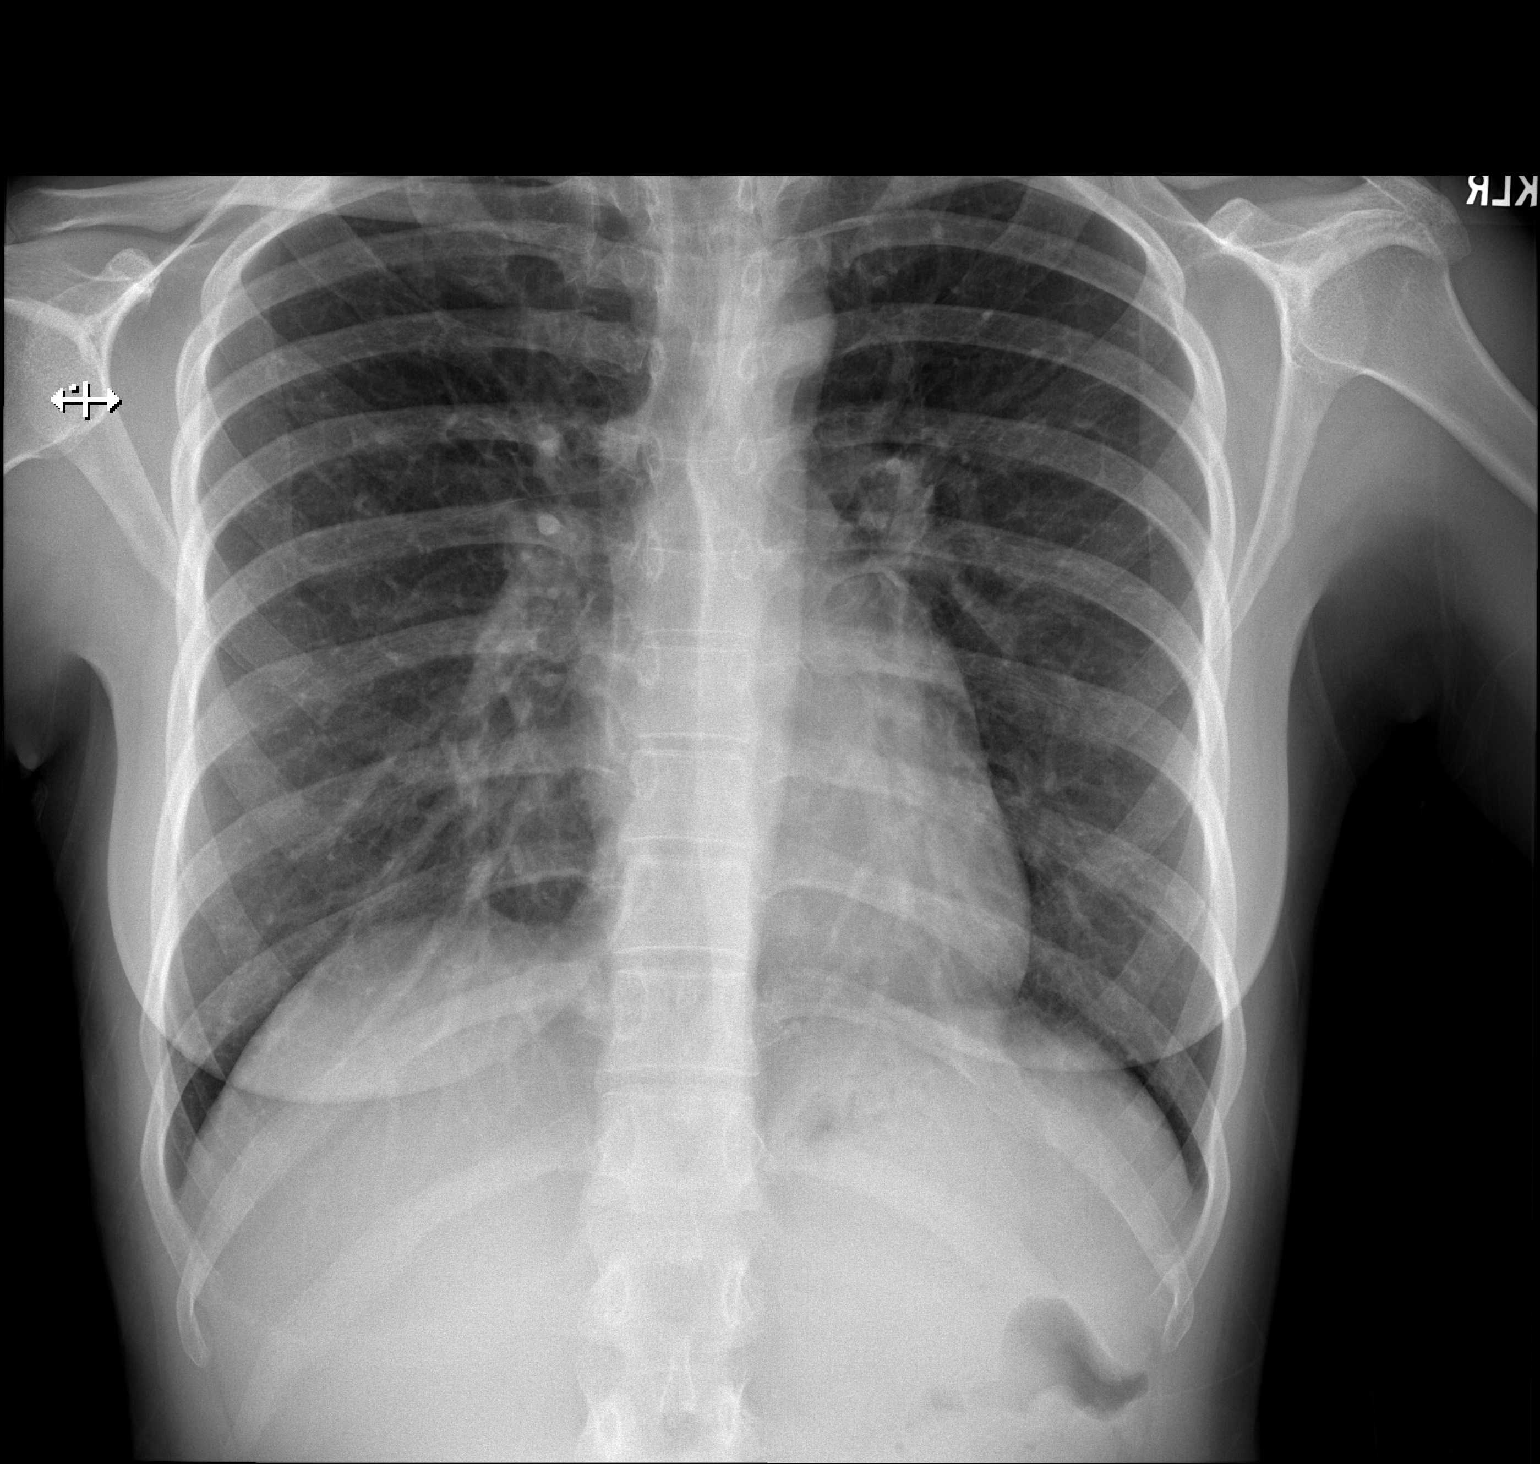
[im 2/2]
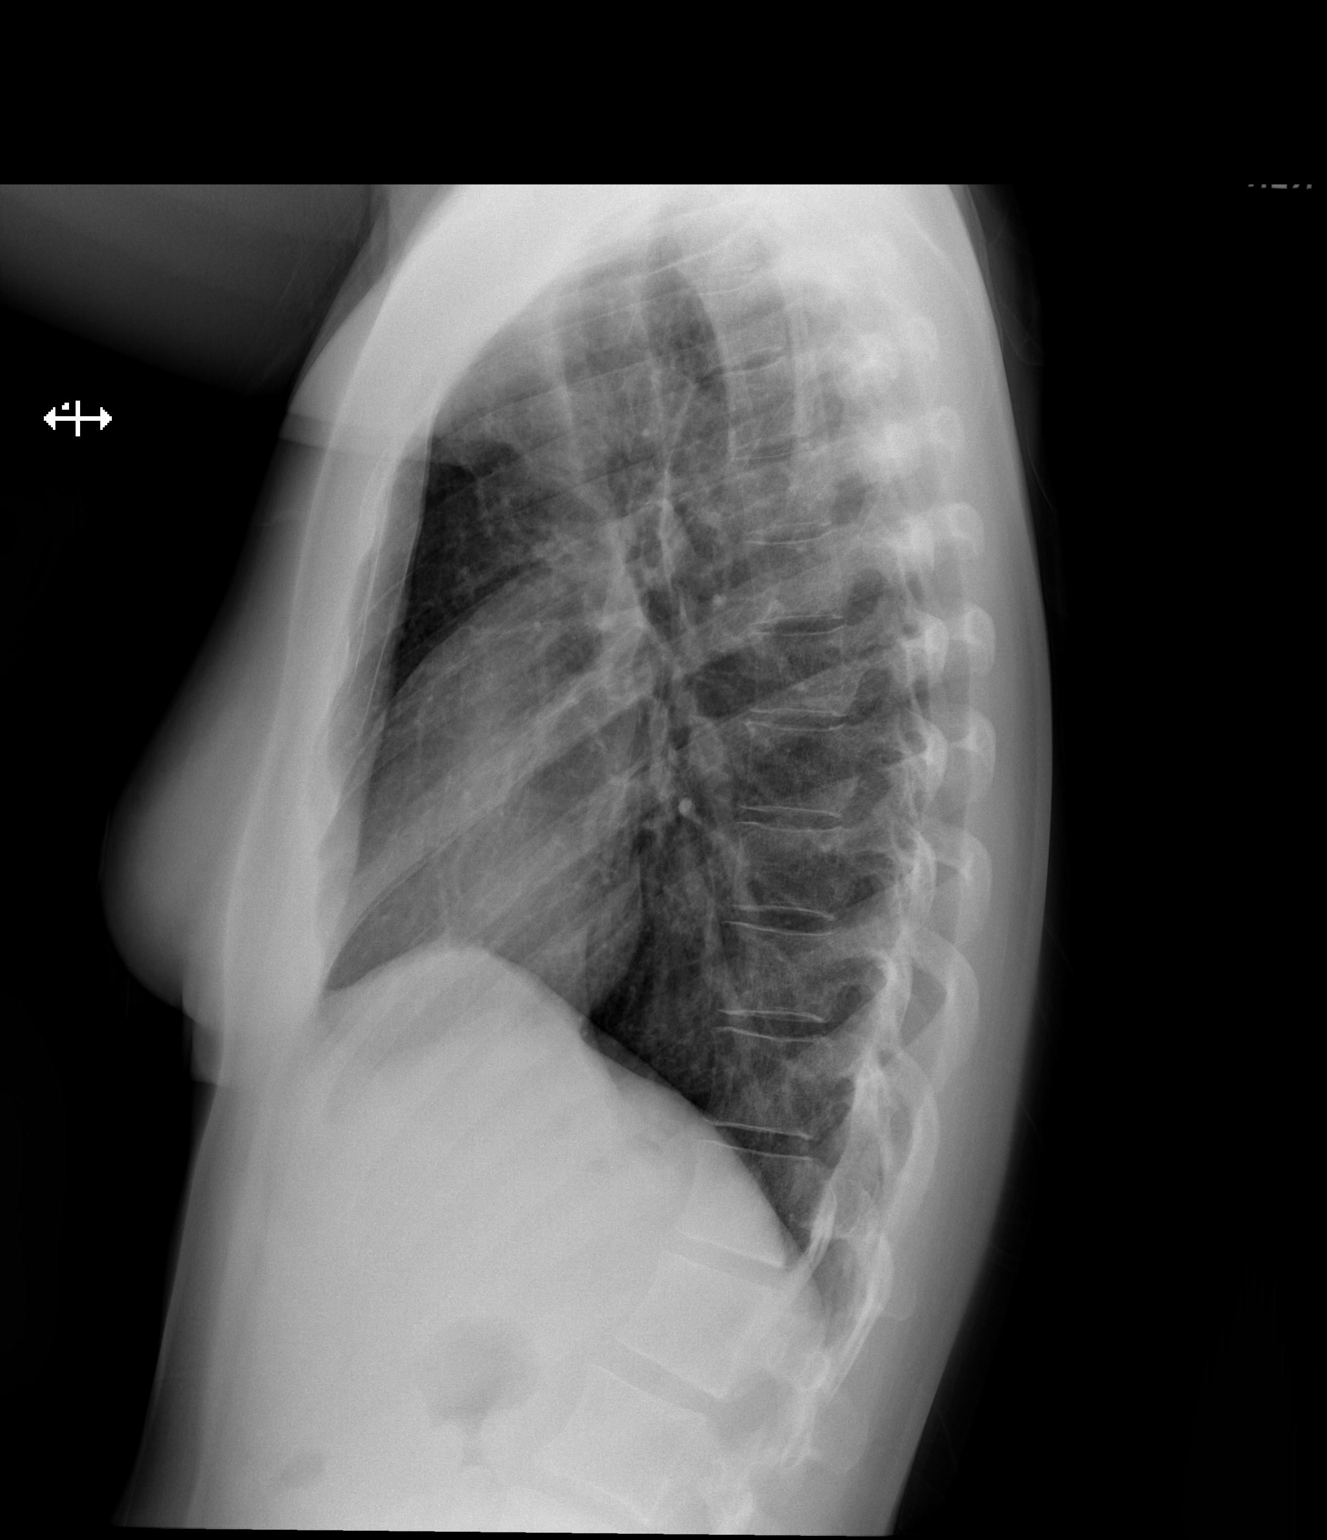

[2 of 2 positions shown; findings below may reference images not displayed]

FINDINGS: The heart size and mediastinal contours are within normal limits.
Both lungs are clear. The visualized skeletal structures are
unremarkable.
IMPRESSION: No active cardiopulmonary disease.

## 2020-05-15 MED FILL — NORTRIPTYLINE HCL 10 MG CAP: 10 | 30 days supply | Qty: 90 | Fill #0

## 2020-05-16 ENCOUNTER — Ambulatory Visit: Payer: No Typology Code available for payment source | Admitting: Clinical

## 2020-05-17 ENCOUNTER — Ambulatory Visit (INDEPENDENT_AMBULATORY_CARE_PROVIDER_SITE_OTHER): Payer: Medicaid Other | Admitting: Clinical

## 2020-05-17 DIAGNOSIS — F840 Autistic disorder: Secondary | ICD-10-CM

## 2020-05-17 MED FILL — TAYTULLA 1 MG-20 MCG CAP: 1-20 | 28 days supply | Qty: 28 | Fill #2

## 2020-05-22 ENCOUNTER — Ambulatory Visit: Payer: No Typology Code available for payment source | Admitting: Clinical

## 2020-05-22 MED FILL — LORAZEPAM 1 MG TABS: 1 | 30 days supply | Qty: 60 | Fill #1

## 2020-05-23 ENCOUNTER — Ambulatory Visit (INDEPENDENT_AMBULATORY_CARE_PROVIDER_SITE_OTHER): Payer: Medicaid Other | Admitting: Clinical

## 2020-05-23 DIAGNOSIS — F840 Autistic disorder: Secondary | ICD-10-CM | POA: Diagnosis not present

## 2020-05-25 ENCOUNTER — Ambulatory Visit (INDEPENDENT_AMBULATORY_CARE_PROVIDER_SITE_OTHER): Payer: Medicaid Other | Admitting: Clinical

## 2020-05-25 DIAGNOSIS — F840 Autistic disorder: Secondary | ICD-10-CM

## 2020-05-29 MED FILL — lamoTRIgine 200 MG TABS: 200 | 30 days supply | Qty: 60 | Fill #0

## 2020-05-30 ENCOUNTER — Ambulatory Visit (INDEPENDENT_AMBULATORY_CARE_PROVIDER_SITE_OTHER): Payer: Medicaid Other | Admitting: Clinical

## 2020-05-30 DIAGNOSIS — F840 Autistic disorder: Secondary | ICD-10-CM

## 2020-06-05 ENCOUNTER — Ambulatory Visit: Payer: No Typology Code available for payment source | Admitting: Clinical

## 2020-06-06 ENCOUNTER — Ambulatory Visit (INDEPENDENT_AMBULATORY_CARE_PROVIDER_SITE_OTHER): Payer: Medicaid Other | Admitting: Clinical

## 2020-06-06 DIAGNOSIS — F840 Autistic disorder: Secondary | ICD-10-CM

## 2020-06-08 ENCOUNTER — Other Ambulatory Visit (HOSPITAL_COMMUNITY): Payer: Self-pay | Admitting: Psychiatry

## 2020-06-08 MED FILL — FLUoxetine HCL 10 MG CAPS: 10 | 30 days supply | Qty: 90 | Fill #0

## 2020-06-08 MED FILL — NORTRIPTYLINE HCL 10 MG CAP: 10 | 30 days supply | Qty: 90 | Fill #0

## 2020-06-08 MED FILL — ARIPiprazole 2 MG TABS: 2 | 30 days supply | Qty: 15 | Fill #1

## 2020-06-13 ENCOUNTER — Ambulatory Visit (INDEPENDENT_AMBULATORY_CARE_PROVIDER_SITE_OTHER): Payer: Medicaid Other | Admitting: Clinical

## 2020-06-13 DIAGNOSIS — F840 Autistic disorder: Secondary | ICD-10-CM | POA: Diagnosis not present

## 2020-06-15 ENCOUNTER — Encounter: Payer: Self-pay | Admitting: Family Medicine

## 2020-06-15 ENCOUNTER — Other Ambulatory Visit: Payer: Self-pay

## 2020-06-15 ENCOUNTER — Other Ambulatory Visit: Payer: Self-pay | Admitting: Family Medicine

## 2020-06-15 ENCOUNTER — Ambulatory Visit (INDEPENDENT_AMBULATORY_CARE_PROVIDER_SITE_OTHER): Payer: No Typology Code available for payment source | Admitting: Family Medicine

## 2020-06-15 VITALS — BP 120/72 | HR 67 | Temp 97.5°F | Wt 148.6 lb

## 2020-06-15 DIAGNOSIS — R3915 Urgency of urination: Secondary | ICD-10-CM | POA: Diagnosis not present

## 2020-06-15 DIAGNOSIS — L309 Dermatitis, unspecified: Secondary | ICD-10-CM

## 2020-06-15 DIAGNOSIS — G43019 Migraine without aura, intractable, without status migrainosus: Secondary | ICD-10-CM

## 2020-06-15 LAB — POCT URINALYSIS DIP (PROADVANTAGE DEVICE)
Bilirubin, UA: NEGATIVE
Blood, UA: NEGATIVE
Glucose, UA: NEGATIVE mg/dL
Ketones, POC UA: NEGATIVE mg/dL
Leukocytes, UA: NEGATIVE
Nitrite, UA: NEGATIVE
Protein Ur, POC: NEGATIVE mg/dL
Specific Gravity, Urine: 1.005
Urobilinogen, Ur: NEGATIVE
pH, UA: 6.5 (ref 5.0–8.0)

## 2020-06-15 MED ORDER — TRIAMCINOLONE ACETONIDE 0.1 % EX OINT: 1.0000 "application " | TOPICAL_OINTMENT | Freq: Two times a day (BID) | CUTANEOUS | 0 refills | Status: DC

## 2020-06-15 MED FILL — RIZATRIPTAN BENZOATE 10 MG: 10 | 30 days supply | Qty: 10 | Fill #3

## 2020-06-15 MED FILL — TAYTULLA 1 MG-20 MCG CAP: 1-20 | 28 days supply | Qty: 28 | Fill #3

## 2020-06-15 MED FILL — TRIAMCINOLONE 0.1% OINTMENT: 0.1 | 15 days supply | Qty: 30 | Fill #0

## 2020-06-15 NOTE — Patient Instructions (Signed)
Eczema Eczema refers to a group of skin conditions that cause skin to become rough and inflamed. Each type of eczema has different triggers, symptoms, and treatments. Eczema of any type is usually itchy. Symptoms range from mild to severe. Eczema is not spread from person to person (is not contagious). It can appear on different parts of the body at different times. One person's eczema may look different from another person's eczema. What are the causes? The exact cause of this condition is not known. However, exposure to certain environmental factors, irritants, and allergens can make the condition worse. What are the signs or symptoms? Symptoms of this condition depend on the type of eczema you have. The types include:  Contact dermatitis. There are two kinds: ? Irritant contact dermatitis. This happens when something irritates the skin and causes a rash. ? Allergic contact dermatitis. This happens when your skin comes in contact with something you are allergic to (allergens). This can include poison ivy, chemicals, or medicines that were applied to your skin.  Atopic dermatitis. This is a long-term (chronic) skin disease that keeps coming back (recurring). It is the most common type of eczema. Usual symptoms are a red rash and itchy, dry, scaly skin. It usually starts showing signs in infancy and can last through adulthood.  Dyshidrotic eczema. This is a form of eczema on the hands and feet. It shows up as very itchy, fluid-filled blisters. It can affect people of any age but is more common before age 40.  Hand eczema. This causes very itchy areas of skin on the palms and sides of the hands and fingers. This type of eczema is common in industrial jobs where you may be exposed to different types of irritants.  Lichen simplex chronicus. This type of eczema occurs when a person constantly scratches one area of the body. Repeated scratching of the area leads to thickened skin (lichenification). This  condition can accompany other types of eczema. It is more common in adults but may also be seen in children.  Nummular eczema. This is a common type of eczema that most often affects the lower legs and the backs of the hands. It typically causes an itchy, red, circular, crusty lesion (plaque). Scratching may become a habit and can cause bleeding. Nummular eczema occurs most often in middle-aged or older people.  Seborrheic dermatitis. This is a common skin disease that mainly affects the scalp. It may also affect other oily areas of the body, such as the face, sides of the nose, eyebrows, ears, eyelids, and chest. It is marked by small scaling and redness of the skin (erythema). This can affect people of all ages. In infants, this condition is called cradle cap.  Stasis dermatitis. This is a common skin disease that can cause itching, scaling, and hyperpigmentation, usually on the legs and feet. It occurs most often in people who have a condition that prevents blood from being pumped through the veins in the legs (chronic venous insufficiency). Stasis dermatitis is a chronic condition that needs long-term management.   How is this diagnosed? This condition may be diagnosed based on:  A physical exam of your skin.  Your medical history.  Skin patch tests. These tests involve using patches that contain possible allergens and placing them on your back. Your health care provider will check in a few days to see if an allergic reaction occurred. How is this treated? Treatment for eczema is based on the type of eczema you have. You may be   given hydrocortisone steroid medicine or antihistamines. These can relieve itching quickly and help reduce inflammation. These may be prescribed or purchased over the counter, depending on the strength that is needed. Follow these instructions at home:  Take or apply over-the-counter and prescription medicines only as told by your health care provider.  Use creams or  ointments to moisturize your skin. Do not use lotions.  Learn what triggers or irritates your symptoms so you can avoid these things.  Treat symptom flare-ups quickly.  Do not scratch your skin. This can make your rash worse.  Keep all follow-up visits. This is important. Where to find more information  American Academy of Dermatology: aad.org  National Eczema Association: nationaleczema.org  The Society for Pediatric Dermatology: pedsderm.net Contact a health care provider if:  You have severe itching, even with treatment.  You scratch your skin regularly until it bleeds.  Your rash looks different than usual.  Your skin is painful, swollen, or more red than usual.  You have a fever. Summary  Eczema refers to a group of skin conditions that cause skin to become rough and inflamed. Each type has different triggers.  Eczema of any type causes itching that may range from mild to severe.  Treatment varies based on the type of eczema you have. Hydrocortisone steroid medicine or antihistamines can help with itching and inflammation.  Protecting your skin is the best way to prevent eczema. Use creams or ointments to moisturize your skin. Avoid triggers and irritants. Treat flare-ups quickly. This information is not intended to replace advice given to you by your health care provider. Make sure you discuss any questions you have with your health care provider. Document Revised: 01/24/2020 Document Reviewed: 01/24/2020 Elsevier Patient Education  2021 Elsevier Inc.  

## 2020-06-15 NOTE — Progress Notes (Signed)
   Subjective:    Patient ID: Jill Alexander, female    DOB: [birth year], 25 y.o.   MRN: [id number]  HPI Chief Complaint  Patient presents with  . ezcema     Ezcema on hands few a months. Tried different lotions   She is here with her mother with concerns regarding redness, itching, burning and painful skin on the tops of her hands and wrists.  History of eczema.  She has tried different types of over-the-counter lotions.  Denies rash to any other body part.  Denies arthralgias or myalgias. No fever, chills, nausea, vomiting.  Reports worsening migraine headaches.  Plans to follow-up with her neurologist who is treating her for migraine headaches.  Complains of urinary urgency recently.  No dysuria, frequency, abdominal pain or low back pain.   Review of Systems Pertinent positives and negatives in the history of present illness.     Objective:   Physical Exam BP 120/72   Pulse 67   Temp (!) 97.5 F (36.4 C)   Wt 148 lb 9.6 oz (67.4 kg)   BMI 27.18 kg/m    Alert and oriented and in no acute distress.  Respirations unlabored.  Bilateral hands and wrists with erythema, and dry, cracked skin on the dorsal surfaces and in a glove like pattern.  No palmar rash.  Nails unremarkable.     Assessment & Plan:  Eczema of both hands - Plan: triamcinolone ointment (KENALOG) 0.1 % -She will try triamcinolone ointment for the next 7 to 10 days.  Discussed keeping the areas as dry as possible.  Try to avoid using excessive hand sanitizer or excessive washing of her hands. Discussed trying white cotton gloves to keep the steroid ointment on her hands as long as possible. In between steroid use, she will keep her hands well moisturized. She will follow-up if the condition is worsening or not significantly improved over the next 1 to 2 weeks.  Consider referral to dermatology if needed.  Common migraine with intractable migraine -Refer back to neurologist  Urinary urgency - Plan:  POCT Urinalysis DIP (Proadvantage Device) -UA negative today.  Follow-up if this is not improving

## 2020-06-16 ENCOUNTER — Encounter: Payer: Self-pay | Admitting: Neurology

## 2020-06-19 ENCOUNTER — Ambulatory Visit: Payer: No Typology Code available for payment source | Admitting: Clinical

## 2020-06-19 NOTE — Progress Notes (Signed)
PATIENT: Jill Alexander DOB: [birth year]  REASON FOR VISIT: follow up HISTORY FROM: patient  HISTORY OF PRESENT ILLNESS: Today 06/20/20 Jill Alexander is a 25 year old female with history of autistic disorder and migraine headaches. She is on nortriptyline and Topamax. She takes Maxalt for acute headache. She sees a psychiatrist, remains on Abilify, Prozac, and Lamictal. She lives with her parents, does not drive a car. At last visit Topamax was increased to 100 mg at bedtime, Maxalt was added, incomplete relief with Imitrex. Reports things got better higher dose of Topamax, recently started to increase. Reports daily headache, usually in the morning, flucuate throughout the day. Feeling more stress, still looking for job. Doesn't take constructive feedback well. Takes Ativan 1 mg twice daily for anxiety. Indicates sleeps. Drinks 2-3 cups of coffee daily is caffeinated, drinking more soda and tea. Here today unaccompanied.   HISTORY 02/21/2020 SS: Jill Alexander is a 25 year old female with history of autistic disorder and migraine headaches.  She is on nortriptyline and Topamax.  She takes Imitrex if needed.  Headaches were very well controlled with the addition of Topamax, they became rare, once a month.  Over the last month or so, has noted an increase in migraines, on average 3-4 a week, usually in the morning.  Feels related to stress, is currently searching for a job, has had a lot of rejections, family stress.  Is seeing a new psychiatrist, is tapering down the dose of Ativan. She remains on Abilify, Prozac, and Lamictal.  Imitrex has not been helpful, incomplete benefit.  Lives with her parents, does not drive a car.  Presents today for evaluation unaccompanied.  Denies side effect of Topamax, does mention some memory loss, hence the reason for decrease of Ativan, but does not feel correlated Topamax.  She started seeing a new psychiatrist.   REVIEW OF SYSTEMS: Out of a complete 14 system  review of symptoms, the patient complains only of the following symptoms, and all other reviewed systems are negative.  headache  ALLERGIES: Allergies  Allergen Reactions  . Gluten Meal     HOME MEDICATIONS: Outpatient Medications Prior to Visit  Medication Sig Dispense Refill  . acetaminophen (TYLENOL) 500 MG tablet Take 500 mg by mouth every 6 (six) hours as needed.    . ARIPiprazole (ABILIFY) 2 MG tablet TAKE 1/2 TABLET BY MOUTH DAILY 45 tablet 0  . Docusate Sodium (COLACE PO) Take by mouth.    . Famotidine (PEPCID PO) Take by mouth.    Marland Kitchen FLUoxetine (PROZAC) 20 MG capsule Take 3 capsules (60 mg total) by mouth daily. 270 capsule 0  . Ibuprofen (MOTRIN PO) Take 200 mg by mouth as needed.    . lamoTRIgine (LAMICTAL) 200 MG tablet Take 1 tablet (200 mg total) by mouth 2 (two) times daily. 180 tablet 0  . LORazepam (ATIVAN) 1 MG tablet Take 1 mg by mouth 2 (two) times daily.    . melatonin 1 MG TABS tablet Take 1 tablet (1 mg total) by mouth at bedtime as needed (insomnia). 30 tablet 0  . Multiple Vitamins-Minerals (MULTIVITAMIN PO) Take 1 tablet by mouth daily.    . Norethin Ace-Eth Estrad-FE (TAYTULLA) 1-20 MG-MCG(24) CAPS Take 1 tablet by mouth daily. 28 capsule 11  . nortriptyline (PAMELOR) 10 MG capsule Take 3 capsules (30 mg total) by mouth at bedtime. 270 capsule 0  . omeprazole (PRILOSEC) 40 MG capsule Take 1 capsule (40 mg total) by mouth 2 (two) times daily. 60 capsule 3  .  ondansetron (ZOFRAN-ODT) 8 MG disintegrating tablet Take 1 tablet (8 mg total) by mouth every 6 (six) hours as needed for nausea or vomiting. 60 tablet 1  . polyethylene glycol (MIRALAX) packet Take 17 g by mouth daily as needed. 14 each 0  . rizatriptan (MAXALT) 10 MG tablet Take 1 tablet (10 mg total) by mouth as needed for migraine. May repeat in 2 hours if needed 10 tablet 11  . topiramate (TOPAMAX) 100 MG tablet Take 1 tablet (100 mg total) by mouth at bedtime. 90 tablet 3  . triamcinolone ointment  (KENALOG) 0.1 % Apply 1 application topically 2 (two) times daily. 30 g 0   No facility-administered medications prior to visit.    PAST MEDICAL HISTORY: Past Medical History:  Diagnosis Date  . Anxiety   . Autism   . Chronic nausea   . Common migraine with intractable migraine 07/01/2019  . Depression   . Dyspepsia   . GERD (gastroesophageal reflux disease)   . Headache   . Heart murmur   . Seizures (HCC)   . Tremor, 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    PAST SURGICAL HISTORY: Past Surgical History:  Procedure Laterality Date  . none      FAMILY HISTORY: Family History  Problem Relation Age of Onset  . Depression Father   . Anxiety disorder Sister   . Bipolar disorder Sister   . Diabetes Maternal Grandfather   . Cancer Maternal Grandfather   . Breast cancer Paternal Grandmother   . Uterine cancer Paternal Grandmother   . Irritable bowel syndrome Paternal Grandmother   . Colon cancer Neg Hx   . Esophageal cancer Neg Hx   . Stomach cancer Neg Hx   . Rectal cancer Neg Hx     SOCIAL HISTORY: Social History   Socioeconomic History  . Marital status: Single    Spouse name: Not on file  . Number of children: 0  . Years of education: Not on file  . Highest education level: Some college, no degree  Occupational History  . Not on file  Tobacco Use  . Smoking status: Never Smoker  . Smokeless tobacco: Never Used  Vaping Use  . Vaping Use: Never used  Substance and Sexual Activity  . Alcohol use: No  . Drug use: No  . Sexual activity: Not Currently    Birth control/protection: Pill  Other Topics Concern  . Not on file  Social History Narrative   Lives with parents in [place]. Pt moved here from [place] in 2018.   Caffeine use: 2-3 cups per day   Left handed    Social Determinants of Health   Financial Resource Strain: Not on file  Food Insecurity: Not on file  Transportation Needs: Not on file  Physical Activity: Not on file  Stress: Not on file  Social  Connections: Not on file  Intimate Partner Violence: Not on file   PHYSICAL EXAM  Vitals:   06/20/20 1552  BP: 110/66  Pulse: 87  Weight: 152 lb (68.9 kg)  Height: 5\' 2"  (1.575 m)   Body mass index is 27.8 kg/m.  Generalized: Well developed, in no acute distress  Neurological examination  Mentation: Alert oriented to time, place, history taking. Follows all commands speech and language fluent Cranial nerve II-XII: Pupils were equal round reactive to light. Extraocular movements were full, visual field were full on confrontational test. Facial sensation and strength were normal. Head turning and shoulder shrug were normal and symmetric. Motor: The motor testing reveals 5 over  5 strength of all 4 extremities. Good symmetric motor tone is noted throughout.  Sensory: Sensory testing is intact to soft touch on all 4 extremities. No evidence of extinction is noted.  Coordination: Cerebellar testing reveals good finger-nose-finger and heel-to-shin bilaterally.  Fine action type tremors are noted with bilateral upper extremities. Gait and station: Gait is normal. Reflexes: Deep tendon reflexes are symmetric and normal bilaterally.   DIAGNOSTIC DATA (LABS, IMAGING, TESTING) - I reviewed patient records, labs, notes, testing and imaging myself where available.  Lab Results  Component Value Date   WBC 5.1 05/13/2019   HGB 13.2 05/13/2019   HCT 40.2 05/13/2019   MCV 92 05/13/2019   PLT 269 05/13/2019      Component Value Date/Time   NA 142 05/13/2019 1015   K 4.6 05/13/2019 1015   CL 106 05/13/2019 1015   CO2 22 05/13/2019 1015   GLUCOSE 87 05/13/2019 1015   GLUCOSE 83 07/01/2018 1537   BUN 11 05/13/2019 1015   CREATININE 0.68 05/13/2019 1015   CALCIUM 9.4 05/13/2019 1015   PROT 6.5 05/13/2019 1015   ALBUMIN 4.4 05/13/2019 1015   AST 20 05/13/2019 1015   ALT 16 05/13/2019 1015   ALKPHOS 59 05/13/2019 1015   BILITOT 0.2 05/13/2019 1015   GFRNONAA 122 05/13/2019 1015   GFRAA  141 05/13/2019 1015   No results found for: CHOL, HDL, LDLCALC, LDLDIRECT, TRIG, CHOLHDL Lab Results  Component Value Date   HGBA1C 5.2 07/14/2019   No results found for: VITAMINB12 Lab Results  Component Value Date   TSH 0.650 05/13/2019      ASSESSMENT AND PLAN 25 y.o. year old female  has a past medical history of Anxiety, Autism, Chronic nausea, Common migraine with intractable migraine (07/01/2019), Depression, Dyspepsia, GERD (gastroesophageal reflux disease), Headache, Heart murmur, Seizures (HCC), and Tremor, essential (04/09/2017). here with:  1.  Intractable migraine headache  -Sees psych already taking Lamictal, Prozac, Abilify, nortriptyline -I think stress, anxiety, caffeine, medication overuse, are contributing to rebound headaches and overall increase in the frequency of her headaches -Recommend cutting back on caffeine, increase water intake, only treat severe headaches, ideally no more than 2 to 3 days a week -She will try this for a few weeks, continue Topamax, Maxalt -If the headaches do not improve, may consider CGRP, possibly even propranolol  -Keep follow-up appointment in April  I spent 30 minutes of face-to-face and non-face-to-face time with patient.  This included previsit chart review, lab review, study review, order entry, electronic health record documentation, patient education.  Margie Ege, AGNP-C, DNP 06/20/2020, 4:08 PM [facility] [address], [address] [place], [place] [address] [phone]

## 2020-06-20 ENCOUNTER — Ambulatory Visit (INDEPENDENT_AMBULATORY_CARE_PROVIDER_SITE_OTHER): Payer: No Typology Code available for payment source | Admitting: Neurology

## 2020-06-20 ENCOUNTER — Ambulatory Visit (INDEPENDENT_AMBULATORY_CARE_PROVIDER_SITE_OTHER): Payer: Medicaid Other | Admitting: Clinical

## 2020-06-20 ENCOUNTER — Encounter: Payer: Self-pay | Admitting: Neurology

## 2020-06-20 VITALS — BP 110/66 | HR 87 | Ht 62.0 in | Wt 152.0 lb

## 2020-06-20 DIAGNOSIS — G43019 Migraine without aura, intractable, without status migrainosus: Secondary | ICD-10-CM

## 2020-06-20 DIAGNOSIS — F840 Autistic disorder: Secondary | ICD-10-CM

## 2020-06-20 NOTE — Patient Instructions (Signed)
Try to cut back on caffeine intake Limit only treating headaches no more than 2-3 days a week  Cut back on daily OTC medications Drink plenty of water See you back in 6 months

## 2020-06-20 NOTE — Progress Notes (Signed)
I have read the note, and I agree with the clinical assessment and plan.  Jill Alexander   

## 2020-06-21 ENCOUNTER — Ambulatory Visit: Payer: Self-pay | Admitting: Gastroenterology

## 2020-06-21 ENCOUNTER — Ambulatory Visit (INDEPENDENT_AMBULATORY_CARE_PROVIDER_SITE_OTHER): Payer: Medicaid Other | Admitting: Clinical

## 2020-06-21 DIAGNOSIS — F840 Autistic disorder: Secondary | ICD-10-CM | POA: Diagnosis not present

## 2020-06-22 MED FILL — LORAZEPAM 1 MG TABS: 1 | 30 days supply | Qty: 60 | Fill #0

## 2020-06-27 ENCOUNTER — Other Ambulatory Visit: Payer: Self-pay | Admitting: Family Medicine

## 2020-06-27 ENCOUNTER — Encounter: Payer: Self-pay | Admitting: Family Medicine

## 2020-06-27 ENCOUNTER — Ambulatory Visit (INDEPENDENT_AMBULATORY_CARE_PROVIDER_SITE_OTHER): Payer: Medicaid Other | Admitting: Clinical

## 2020-06-27 DIAGNOSIS — F840 Autistic disorder: Secondary | ICD-10-CM | POA: Diagnosis not present

## 2020-06-27 DIAGNOSIS — L309 Dermatitis, unspecified: Secondary | ICD-10-CM

## 2020-06-27 MED ORDER — TRIAMCINOLONE ACETONIDE 0.1 % EX OINT: 1.0000 "application " | TOPICAL_OINTMENT | Freq: Two times a day (BID) | CUTANEOUS | 0 refills | Status: DC

## 2020-06-28 ENCOUNTER — Other Ambulatory Visit: Payer: Self-pay

## 2020-06-28 DIAGNOSIS — L309 Dermatitis, unspecified: Secondary | ICD-10-CM

## 2020-06-29 MED FILL — lamoTRIgine 200 MG TABS: 200 | 30 days supply | Qty: 60 | Fill #1

## 2020-07-03 ENCOUNTER — Ambulatory Visit: Payer: No Typology Code available for payment source | Admitting: Clinical

## 2020-07-04 ENCOUNTER — Ambulatory Visit: Payer: No Typology Code available for payment source | Admitting: Clinical

## 2020-07-05 ENCOUNTER — Ambulatory Visit (INDEPENDENT_AMBULATORY_CARE_PROVIDER_SITE_OTHER): Payer: Medicaid Other | Admitting: Clinical

## 2020-07-05 DIAGNOSIS — F840 Autistic disorder: Secondary | ICD-10-CM

## 2020-07-07 MED FILL — ARIPiprazole 2 MG TABS: 2 | 30 days supply | Qty: 15 | Fill #0

## 2020-07-11 ENCOUNTER — Ambulatory Visit (INDEPENDENT_AMBULATORY_CARE_PROVIDER_SITE_OTHER): Payer: Medicaid Other | Admitting: Clinical

## 2020-07-11 DIAGNOSIS — F840 Autistic disorder: Secondary | ICD-10-CM

## 2020-07-13 ENCOUNTER — Ambulatory Visit: Payer: Self-pay | Admitting: Gastroenterology

## 2020-07-17 ENCOUNTER — Ambulatory Visit (INDEPENDENT_AMBULATORY_CARE_PROVIDER_SITE_OTHER): Payer: Medicaid Other | Admitting: Clinical

## 2020-07-17 DIAGNOSIS — F840 Autistic disorder: Secondary | ICD-10-CM | POA: Diagnosis not present

## 2020-07-17 MED FILL — TAYTULLA 1 MG-20 MCG CAP: 1-20 | 84 days supply | Qty: 84 | Fill #4

## 2020-07-18 ENCOUNTER — Ambulatory Visit (INDEPENDENT_AMBULATORY_CARE_PROVIDER_SITE_OTHER): Payer: Medicaid Other | Admitting: Clinical

## 2020-07-18 DIAGNOSIS — F840 Autistic disorder: Secondary | ICD-10-CM | POA: Diagnosis not present

## 2020-07-21 MED FILL — LORAZEPAM 1 MG TABS: 1 | 30 days supply | Qty: 60 | Fill #1

## 2020-07-25 ENCOUNTER — Ambulatory Visit (INDEPENDENT_AMBULATORY_CARE_PROVIDER_SITE_OTHER): Payer: No Typology Code available for payment source | Admitting: Clinical

## 2020-07-25 DIAGNOSIS — F840 Autistic disorder: Secondary | ICD-10-CM

## 2020-07-31 ENCOUNTER — Other Ambulatory Visit (HOSPITAL_COMMUNITY): Payer: Self-pay

## 2020-07-31 ENCOUNTER — Ambulatory Visit (INDEPENDENT_AMBULATORY_CARE_PROVIDER_SITE_OTHER): Payer: Medicaid Other | Admitting: Clinical

## 2020-07-31 DIAGNOSIS — F840 Autistic disorder: Secondary | ICD-10-CM | POA: Diagnosis not present

## 2020-07-31 MED ORDER — ARIPIPRAZOLE 2 MG PO TABS
1.0000 mg | ORAL_TABLET | Freq: Every day | ORAL | 1 refills | Status: DC
  Filled 2020-07-31: qty 15, 30d supply, fill #0

## 2020-07-31 MED ORDER — FLUOXETINE HCL 10 MG PO CAPS
30.0000 mg | ORAL_CAPSULE | Freq: Every day | ORAL | 1 refills | Status: DC
  Filled 2020-07-31: qty 90, 30d supply, fill #0

## 2020-07-31 MED ORDER — NORTRIPTYLINE HCL 10 MG PO CAPS
ORAL_CAPSULE | ORAL | 1 refills | Status: DC
  Filled 2020-07-31: qty 90, 30d supply, fill #0

## 2020-07-31 MED ORDER — LAMOTRIGINE 200 MG PO TABS
200.0000 mg | ORAL_TABLET | Freq: Two times a day (BID) | ORAL | 1 refills | Status: DC
  Filled 2020-07-31: qty 60, 30d supply, fill #0

## 2020-08-01 ENCOUNTER — Ambulatory Visit: Payer: Medicaid Other | Admitting: Clinical

## 2020-08-01 ENCOUNTER — Other Ambulatory Visit (HOSPITAL_COMMUNITY): Payer: Self-pay

## 2020-08-02 ENCOUNTER — Other Ambulatory Visit (HOSPITAL_COMMUNITY): Payer: Self-pay

## 2020-08-08 ENCOUNTER — Ambulatory Visit (INDEPENDENT_AMBULATORY_CARE_PROVIDER_SITE_OTHER): Payer: No Typology Code available for payment source | Admitting: Clinical

## 2020-08-08 DIAGNOSIS — F840 Autistic disorder: Secondary | ICD-10-CM | POA: Diagnosis not present

## 2020-08-14 ENCOUNTER — Ambulatory Visit: Payer: No Typology Code available for payment source | Admitting: Clinical

## 2020-08-14 ENCOUNTER — Other Ambulatory Visit: Payer: Self-pay | Admitting: Psychiatry

## 2020-08-14 ENCOUNTER — Ambulatory Visit (INDEPENDENT_AMBULATORY_CARE_PROVIDER_SITE_OTHER): Payer: No Typology Code available for payment source | Admitting: Clinical

## 2020-08-14 DIAGNOSIS — F840 Autistic disorder: Secondary | ICD-10-CM | POA: Diagnosis not present

## 2020-08-15 ENCOUNTER — Other Ambulatory Visit (HOSPITAL_COMMUNITY): Payer: Self-pay

## 2020-08-15 MED FILL — Nortriptyline HCl Cap 10 MG: ORAL | 30 days supply | Qty: 90 | Fill #0 | Status: AC

## 2020-08-16 ENCOUNTER — Ambulatory Visit: Payer: Medicaid Other | Admitting: Gastroenterology

## 2020-08-18 ENCOUNTER — Other Ambulatory Visit: Payer: Self-pay | Admitting: Psychiatry

## 2020-08-18 ENCOUNTER — Ambulatory Visit: Payer: Medicaid Other | Admitting: Clinical

## 2020-08-18 ENCOUNTER — Other Ambulatory Visit (HOSPITAL_COMMUNITY): Payer: Self-pay

## 2020-08-18 MED FILL — Topiramate Tab 100 MG: ORAL | 90 days supply | Qty: 90 | Fill #0 | Status: CN

## 2020-08-21 ENCOUNTER — Other Ambulatory Visit (HOSPITAL_COMMUNITY): Payer: Self-pay

## 2020-08-21 ENCOUNTER — Ambulatory Visit (INDEPENDENT_AMBULATORY_CARE_PROVIDER_SITE_OTHER): Payer: No Typology Code available for payment source | Admitting: Neurology

## 2020-08-21 ENCOUNTER — Encounter: Payer: Self-pay | Admitting: Neurology

## 2020-08-21 VITALS — BP 104/70 | HR 73 | Ht 63.0 in | Wt 141.0 lb

## 2020-08-21 DIAGNOSIS — G43019 Migraine without aura, intractable, without status migrainosus: Secondary | ICD-10-CM | POA: Diagnosis not present

## 2020-08-21 MED ORDER — TOPIRAMATE 100 MG PO TABS
100.0000 mg | ORAL_TABLET | Freq: Every day | ORAL | 3 refills | Status: DC
  Filled 2020-08-21: qty 90, 90d supply, fill #0
  Filled 2020-10-30: qty 90, 90d supply, fill #1

## 2020-08-21 MED ORDER — RIZATRIPTAN BENZOATE 10 MG PO TABS
ORAL_TABLET | ORAL | 11 refills | Status: DC | PRN
  Filled 2020-08-21: qty 10, 30d supply, fill #0
  Filled 2020-09-16: qty 10, 30d supply, fill #1
  Filled 2020-11-14: qty 10, 30d supply, fill #2
  Filled 2020-12-13: qty 10, 30d supply, fill #3
  Filled 2021-01-08: qty 10, 30d supply, fill #4
  Filled 2021-02-03: qty 10, 30d supply, fill #5
  Filled 2021-03-11: qty 10, 30d supply, fill #6
  Filled 2021-04-16: qty 10, 30d supply, fill #7
  Filled 2021-06-08: qty 10, 30d supply, fill #8

## 2020-08-21 NOTE — Patient Instructions (Signed)
For now continue current medications  Call or message with worsening headaches See you back in 8 months

## 2020-08-21 NOTE — Progress Notes (Signed)
PATIENT: Jill Alexander DOB: Aug 11, 1995  REASON FOR VISIT: follow up HISTORY FROM: patient  HISTORY OF PRESENT ILLNESS: Today 08/21/20 Jill Alexander is a 25 year old female with history of autistic disorder and migraine headaches.  Is on nortriptyline and Topamax.  Maxalt for acute headache. Hard time cutting back caffeine, loves coffee. Cut back on OTC Tylenol or Ibuprofen. Reports few times a week headache, 1 significant headache last 2 weeks. Had COVID last week. Takes Maxalt infrequently, relies on Tylenol. 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 Helping her stress level, anxiety. Her mom tells her she worries about worrying. Doesn't think she wants more medications for headaches.  On Abilify, Prozac, Lamictal, and nortriptyline.  Sees psychiatry.  Here today for evaluation unaccompanied.  Update 06/20/2020 SS: Jill Alexander is a 25 year old female with history of autistic disorder and migraine headaches. She is on nortriptyline and Topamax. She takes Maxalt for acute headache. She sees a psychiatrist, remains on Abilify, Prozac, and Lamictal. She lives with her parents, does not drive a car. At last visit Topamax was increased to 100 mg at bedtime, Maxalt was added, incomplete relief with Imitrex. Reports things got better higher dose of Topamax, recently started to increase. Reports daily headache, usually in the morning, flucuate throughout the day. Feeling more stress, still looking for job. Doesn't take constructive feedback well. Takes Ativan 1 mg twice daily for anxiety. Indicates sleeps. Drinks 2-3 cups of coffee daily is caffeinated, drinking more soda and tea. Here today unaccompanied.   HISTORY 02/21/2020 SS: Jill Alexander is a 25 year old female with history of autistic disorder and migraine headaches.  She is on nortriptyline and Topamax.  She takes Imitrex if needed.  Headaches were very well controlled with the addition of Topamax, they became rare, once a month.  Over the  last month or so, has noted an increase in migraines, on average 3-4 a week, usually in the morning.  Feels related to stress, is currently searching for a job, has had a lot of rejections, family stress.  Is seeing a new psychiatrist, is tapering down the dose of Ativan. She remains on Abilify, Prozac, and Lamictal.  Imitrex has not been helpful, incomplete benefit.  Lives with her parents, does not drive a car.  Presents today for evaluation unaccompanied.  Denies side effect of Topamax, does mention some memory loss, hence the reason for decrease of Ativan, but does not feel correlated Topamax.  She started seeing a new psychiatrist.   REVIEW OF SYSTEMS: Out of a complete 14 system review of symptoms, the patient complains only of the following symptoms, and all other reviewed systems are negative.  headache  ALLERGIES: Allergies  Allergen Reactions  . Gluten Meal     HOME MEDICATIONS: Outpatient Medications Prior to Visit  Medication Sig Dispense Refill  . acetaminophen (TYLENOL) 500 MG tablet Take 500 mg by mouth every 6 (six) hours as needed.    . ARIPiprazole (ABILIFY) 2 MG tablet TAKE 1/2 TABLET BY MOUTH DAILY 45 tablet 0  . Docusate Sodium (COLACE PO) Take by mouth.    . Famotidine (PEPCID PO) Take by mouth.    Marland Kitchen FLUoxetine (PROZAC) 10 MG capsule TAKE 3 CAPSULE(S) BY MOUTH AT BEDTIME FOR ANXIETY 90 capsule 1  . Ibuprofen (MOTRIN PO) Take 200 mg by mouth as needed.    . lamoTRIgine (LAMICTAL) 200 MG tablet Take 1 tablet (200 mg total) by mouth 2 (two) times daily. 180 tablet 0  . LORazepam (ATIVAN) 1  MG tablet Take 1 mg by mouth 2 (two) times daily.    . melatonin 1 MG TABS tablet Take 1 tablet (1 mg total) by mouth at bedtime as needed (insomnia). 30 tablet 0  . Multiple Vitamins-Minerals (MULTIVITAMIN PO) Take 1 tablet by mouth daily.    . Norethin Ace-Eth Estrad-FE 1-20 MG-MCG(24) CAPS TAKE 1 TABLET BY MOUTH DAILY. 28 capsule 11  . nortriptyline (PAMELOR) 10 MG capsule Take 3  capsules (30 mg total) by mouth at bedtime. 270 capsule 0  . ondansetron (ZOFRAN-ODT) 8 MG disintegrating tablet Take 1 tablet (8 mg total) by mouth every 6 (six) hours as needed for nausea or vomiting. 60 tablet 1  . polyethylene glycol (MIRALAX) packet Take 17 g by mouth daily as needed. 14 each 0  . rizatriptan (MAXALT) 10 MG tablet TAKE 1 TABLET (10 MG TOTAL) BY MOUTH AS NEEDED FOR MIGRAINE. MAY REPEAT IN 2 HOURS IF NEEDED 10 tablet 11  . topiramate (TOPAMAX) 100 MG tablet TAKE 1 TABLET (100 MG TOTAL) BY MOUTH AT BEDTIME. 90 tablet 3  . triamcinolone ointment (KENALOG) 0.1 % Apply 1 application topically 2 (two) times daily. 30 g 0  . ARIPiprazole (ABILIFY) 2 MG tablet TAKE 1/2 TABLET BY MOUTH AT BEDTIME 15 tablet 1  . ARIPiprazole (ABILIFY) 2 MG tablet TAKE 1/2 TABLET BY MOUTH AT BEDTIME 15 tablet 1  . ARIPiprazole (ABILIFY) 2 MG tablet TAKE 1/2 TABLET BY MOUTH AT BEDTIME. 15 tablet 0  . ARIPiprazole (ABILIFY) 2 MG tablet Take 1/2 tablet by mouth at bedtime 15 tablet 1  . FLUoxetine (PROZAC) 10 MG capsule TAKE 3 CAPSULE(S) BY MOUTH AT BEDTIME FOR ANXIETY 90 capsule 1  . FLUoxetine (PROZAC) 10 MG capsule TAKE 3 CAPSULE BY MOUTH ONCE DAILY AT BEDTIME FOR ANXIETY. 90 capsule 1  . FLUoxetine (PROZAC) 10 MG capsule Take 3 capsules (30 mg total) by mouth at bedtime. 90 capsule 1  . FLUoxetine (PROZAC) 20 MG capsule Take 3 capsules (60 mg total) by mouth daily. 270 capsule 0  . lamoTRIgine (LAMICTAL) 200 MG tablet TAKE 1 TABLET BY MOUTH TWICE A DAY FOR SEIZURES AND DEPRESSION 60 tablet 1  . lamoTRIgine (LAMICTAL) 200 MG tablet TAKE 1 TABLET BY MOUTH TWICE A DAY FOR SEIZURES AND DEPRESSION 60 tablet 1  . lamoTRIgine (LAMICTAL) 200 MG tablet TAKE 1 TABLET BY MOUTH TWICE DAILY FOR SEIZURES AND DEPRESSION. 60 tablet 1  . lamoTRIgine (LAMICTAL) 200 MG tablet TAKE 1 TABLET BY MOUTH TWICE DAILY FOR SEIZURE AND DEPRESSION. 30 tablet 1  . lamoTRIgine (LAMICTAL) 200 MG tablet Take 1 tablet by mouth twice a day  for seizures and depression 60 tablet 1  . LORazepam (ATIVAN) 1 MG tablet TAKE 1 TABLET BY MOUTH TWICE A DAY AS NEEDED FOR ANXIETY 60 tablet 1  . LORazepam (ATIVAN) 1 MG tablet TAKE 1 TABLET BY MOUTH TWICE A DAY AS NEEDED FOR ANXIETY 60 tablet 1  . nortriptyline (PAMELOR) 10 MG capsule TAKE 3 CAPSULE(S) BY MOUTH AT BEDTIME FOR SLEEP AND HEADACHES 90 capsule 1  . nortriptyline (PAMELOR) 10 MG capsule TAKE 3 CAPSULE(S) BY MOUTH AT BEDTIME FOR SLEEP AND HEADACHES 90 capsule 1  . nortriptyline (PAMELOR) 10 MG capsule TAKE 3 CAPSULE BY MOUTH ONCE DAILY AT BEDTIME FOR SLEEP AND HEADACHES. 90 capsule 1  . nortriptyline (PAMELOR) 10 MG capsule Take 3 capsules by mouth at bedtime for sleep and headaches 90 capsule 1  . omeprazole (PRILOSEC) 40 MG capsule Take 1 capsule (40 mg total)  by mouth 2 (two) times daily. 60 capsule 3   No facility-administered medications prior to visit.    PAST MEDICAL HISTORY: Past Medical History:  Diagnosis Date  . Anxiety   . Autism   . Chronic nausea   . Common migraine with intractable migraine 07/01/2019  . Depression   . Dyspepsia   . GERD (gastroesophageal reflux disease)   . Headache   . Heart murmur   . Seizures (HCC)   . Tremor, 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    PAST SURGICAL HISTORY: Past Surgical History:  Procedure Laterality Date  . none      FAMILY HISTORY: Family History  Problem Relation Age of Onset  . Depression Father   . Anxiety disorder Sister   . Bipolar disorder Sister   . Diabetes Maternal Grandfather   . Cancer Maternal Grandfather   . Breast cancer Paternal Grandmother   . Uterine cancer Paternal Grandmother   . Irritable bowel syndrome Paternal Grandmother   . Colon cancer Neg Hx   . Esophageal cancer Neg Hx   . Stomach cancer Neg Hx   . Rectal cancer Neg Hx     SOCIAL HISTORY: Social History   Socioeconomic History  . Marital status: Single    Spouse name: Not on file  . Number of children: 0  . Years of education: Not on  file  . Highest education level: Some college, no degree  Occupational History  . Not on file  Tobacco Use  . Smoking status: Never Smoker  . Smokeless tobacco: Never Used  Vaping Use  . Vaping Use: Never used  Substance and Sexual Activity  . Alcohol use: No  . Drug use: No  . Sexual activity: Not Currently    Birth control/protection: Pill  Other Topics Concern  . Not on file  Social History Narrative   Lives with parents in [place]. Pt moved here from [place] in 2018.   Caffeine use: 2-3 cups per day   Left handed    Social Determinants of Health   Financial Resource Strain: Not on file  Food Insecurity: Not on file  Transportation Needs: Not on file  Physical Activity: Not on file  Stress: Not on file  Social Connections: Not on file  Intimate Partner Violence: Not on file   PHYSICAL EXAM  Vitals:   08/21/20 1319  BP: 104/70  Pulse: 73  Weight: 141 lb (64 kg)  Height: 5\' 3"  (1.6 m)   Body mass index is 24.98 kg/m.  Generalized: Well developed, in no acute distress  Neurological examination  Mentation: Alert oriented to time, place, history taking. Follows all commands speech and language fluent Cranial nerve II-XII: Pupils were equal round reactive to light. Extraocular movements were full, visual field were full on confrontational test. Facial sensation and strength were normal. Head turning and shoulder shrug were normal and symmetric. Motor: The motor testing reveals 5 over 5 strength of all 4 extremities. Good symmetric motor tone is noted throughout.  Sensory: Sensory testing is intact to soft touch on all 4 extremities. No evidence of extinction is noted.  Coordination: Cerebellar testing reveals good finger-nose-finger and heel-to-shin bilaterally.  Fine action type tremors are noted with bilateral upper extremities. Gait and station: Gait is normal. Reflexes: Deep tendon reflexes are symmetric and normal bilaterally.   DIAGNOSTIC DATA (LABS,  IMAGING, TESTING) - I reviewed patient records, labs, notes, testing and imaging myself where available.  Lab Results  Component Value Date   WBC 5.1 05/13/2019  HGB 13.2 05/13/2019   HCT 40.2 05/13/2019   MCV 92 05/13/2019   PLT 269 05/13/2019      Component Value Date/Time   NA 142 05/13/2019 1015   K 4.6 05/13/2019 1015   CL 106 05/13/2019 1015   CO2 22 05/13/2019 1015   GLUCOSE 87 05/13/2019 1015   GLUCOSE 83 07/01/2018 1537   BUN 11 05/13/2019 1015   CREATININE 0.68 05/13/2019 1015   CALCIUM 9.4 05/13/2019 1015   PROT 6.5 05/13/2019 1015   ALBUMIN 4.4 05/13/2019 1015   AST 20 05/13/2019 1015   ALT 16 05/13/2019 1015   ALKPHOS 59 05/13/2019 1015   BILITOT 0.2 05/13/2019 1015   GFRNONAA 122 05/13/2019 1015   GFRAA 141 05/13/2019 1015   No results found for: CHOL, HDL, LDLCALC, LDLDIRECT, TRIG, CHOLHDL Lab Results  Component Value Date   HGBA1C 5.2 07/14/2019   No results found for: VITAMINB12 Lab Results  Component Value Date   TSH 0.650 05/13/2019      ASSESSMENT AND PLAN 25 y.o. year old female  has a past medical history of Anxiety, Autism, Chronic nausea, Common migraine with intractable migraine (07/01/2019), Depression, Dyspepsia, GERD (gastroesophageal reflux disease), Headache, Heart murmur, Seizures (HCC), and Tremor, essential (04/09/2017). here with:  1.  Intractable migraine headache  -has cut back on OTC medication, caffeine, improvement in headaches, also stress level/anxiety better since she got a job -we talked about adding on CGRP, but wants to hold off for now -Limited on other medication options, already taking Lamictal, Prozac, Abilify, nortriptyline -Will continue Topamax for migraine prevention -Continue Maxalt as needed for severe headache -Discussed only treating significant headaches, limiting OTC medications, potential for rebound headache, ensure adequate water intake -Encouraged to reach out via MyChart if headaches increase,  otherwise follow-up in 8 months or sooner if needed  I spent 20 minutes of face-to-face and non-face-to-face time with patient.  This included previsit chart review, lab review, study review, order entry, electronic health record documentation, patient education.  Margie Ege, AGNP-C, DNP 08/21/2020, 1:27 PM [facility] [address] [place], [place] [address] [phone]

## 2020-08-21 NOTE — Progress Notes (Signed)
I have read the note, and I agree with the clinical assessment and plan.  Harjot Dibello K Chrisanne Loose   

## 2020-08-22 ENCOUNTER — Other Ambulatory Visit (HOSPITAL_COMMUNITY): Payer: Self-pay

## 2020-08-22 ENCOUNTER — Other Ambulatory Visit: Payer: Self-pay | Admitting: Psychiatry

## 2020-08-22 ENCOUNTER — Ambulatory Visit (INDEPENDENT_AMBULATORY_CARE_PROVIDER_SITE_OTHER): Payer: No Typology Code available for payment source | Admitting: Clinical

## 2020-08-22 DIAGNOSIS — F840 Autistic disorder: Secondary | ICD-10-CM | POA: Diagnosis not present

## 2020-08-23 ENCOUNTER — Other Ambulatory Visit (HOSPITAL_COMMUNITY): Payer: Self-pay

## 2020-08-25 ENCOUNTER — Other Ambulatory Visit: Payer: Self-pay | Admitting: Psychiatry

## 2020-08-25 ENCOUNTER — Other Ambulatory Visit (HOSPITAL_COMMUNITY): Payer: Self-pay

## 2020-08-28 ENCOUNTER — Ambulatory Visit (INDEPENDENT_AMBULATORY_CARE_PROVIDER_SITE_OTHER): Payer: No Typology Code available for payment source | Admitting: Clinical

## 2020-08-28 DIAGNOSIS — F840 Autistic disorder: Secondary | ICD-10-CM

## 2020-08-29 ENCOUNTER — Other Ambulatory Visit (HOSPITAL_COMMUNITY): Payer: Self-pay

## 2020-08-29 ENCOUNTER — Ambulatory Visit (INDEPENDENT_AMBULATORY_CARE_PROVIDER_SITE_OTHER): Payer: No Typology Code available for payment source | Admitting: Clinical

## 2020-08-29 DIAGNOSIS — F840 Autistic disorder: Secondary | ICD-10-CM

## 2020-08-29 MED ORDER — LAMOTRIGINE 200 MG PO TABS
200.0000 mg | ORAL_TABLET | Freq: Two times a day (BID) | ORAL | 1 refills | Status: DC
  Filled 2020-08-29: qty 60, 30d supply, fill #0

## 2020-08-30 ENCOUNTER — Telehealth: Payer: Self-pay | Admitting: Neurology

## 2020-08-30 ENCOUNTER — Other Ambulatory Visit: Payer: Self-pay | Admitting: Psychiatry

## 2020-08-30 ENCOUNTER — Other Ambulatory Visit (HOSPITAL_COMMUNITY): Payer: Self-pay

## 2020-08-30 MED ORDER — LORAZEPAM 1 MG PO TABS
1.0000 mg | ORAL_TABLET | Freq: Two times a day (BID) | ORAL | 1 refills | Status: DC | PRN
  Filled 2020-08-30: qty 60, 30d supply, fill #0
  Filled 2020-09-27: qty 60, 30d supply, fill #1

## 2020-08-30 NOTE — Telephone Encounter (Signed)
Andrey Campanile and I called the psych office, after long conversation, they will contact the pharmacy, and see if order can be sent in for Ativan.

## 2020-08-30 NOTE — Telephone Encounter (Signed)
Called pt , mother on the other phone.  Stating we took off medlist so they cannot prescribe.  I relayed to pt, that psychiatry can prescribe, but she was not getting this.  Mother to call back if needed. Sometimes things are taken off list and then put back on (we do this all time if needed).  We do not prescribe this medication.

## 2020-08-30 NOTE — Telephone Encounter (Signed)
I called Jill Alexander mother of pt re: this prescription.  Then called  [name] at [facility]. [phone].  Tried to explain that lorazepam prescription 06-08-20 was deleted in error (in trying to clean up her med list) this was placed back on med list as a taking drug.  Per [name], she stated that it comes up as flag that was prescribed 07/31/2020 #60 with one refill. She would not budge from stating would not be able to do this and the office that discontinued this would need to reorder.  I relayed we do not prescribe this for pt and it needs to be done by psychiatrist.  Last drug registry note was 07-21-20 #60 (last refill from 06-08-20 prescription).  I called and spoke to pharmacist at [facility] outpt pharmacy. They did not receive prescription from 07-31-2020 (she stated that epic was switching from old system to new on that date she thought 07-31-20 so may not have escribed).  Psychiatry will not prescribe. Would you be willing to do this for pt?  This time.  Please advise.  Pt going OOT and mother states pt needs this, she has been on for years.

## 2020-08-30 NOTE — Telephone Encounter (Signed)
Pt mother [place] on [place] called stating that some how the pt's LORazepam (ATIVAN) 1 MG tablet was discontinued by the RN but the pt states she did not say that she has stopped taking this medication. Pt will be going out of town tomorrow and they are needing this medication filled before they leave. [facility] has been trying to fill this for days and they did not find out until now what was happening. Mother would like for the pt to be called back to be informed how this can be fixed before they leave. Please advise.

## 2020-09-01 ENCOUNTER — Other Ambulatory Visit (HOSPITAL_COMMUNITY): Payer: Self-pay

## 2020-09-05 ENCOUNTER — Ambulatory Visit: Payer: Medicaid Other | Admitting: Clinical

## 2020-09-05 ENCOUNTER — Other Ambulatory Visit (HOSPITAL_COMMUNITY): Payer: Self-pay

## 2020-09-06 ENCOUNTER — Ambulatory Visit: Payer: Medicaid Other | Admitting: Clinical

## 2020-09-11 ENCOUNTER — Other Ambulatory Visit: Payer: Self-pay | Admitting: Psychiatry

## 2020-09-11 ENCOUNTER — Ambulatory Visit (INDEPENDENT_AMBULATORY_CARE_PROVIDER_SITE_OTHER): Payer: No Typology Code available for payment source | Admitting: Clinical

## 2020-09-11 ENCOUNTER — Other Ambulatory Visit (HOSPITAL_COMMUNITY): Payer: Self-pay

## 2020-09-11 DIAGNOSIS — F840 Autistic disorder: Secondary | ICD-10-CM | POA: Diagnosis not present

## 2020-09-11 MED FILL — Fluoxetine HCl Cap 10 MG: ORAL | 30 days supply | Qty: 90 | Fill #0 | Status: AC

## 2020-09-12 ENCOUNTER — Other Ambulatory Visit (HOSPITAL_COMMUNITY): Payer: Self-pay

## 2020-09-12 ENCOUNTER — Ambulatory Visit: Payer: No Typology Code available for payment source | Admitting: Clinical

## 2020-09-14 ENCOUNTER — Other Ambulatory Visit (HOSPITAL_COMMUNITY): Payer: Self-pay

## 2020-09-14 ENCOUNTER — Other Ambulatory Visit: Payer: Self-pay | Admitting: Psychiatry

## 2020-09-15 ENCOUNTER — Other Ambulatory Visit (HOSPITAL_COMMUNITY): Payer: Self-pay

## 2020-09-18 ENCOUNTER — Other Ambulatory Visit: Payer: Self-pay | Admitting: Psychiatry

## 2020-09-18 ENCOUNTER — Other Ambulatory Visit (HOSPITAL_COMMUNITY): Payer: Self-pay

## 2020-09-18 NOTE — Telephone Encounter (Signed)
Pt's mother, Tenzin Pavon called, there were two more medication discontinued in error; Nortriptyline and Aripiprazole. Would like a call from the nurse.  Contact info: [phone]

## 2020-09-18 NOTE — Telephone Encounter (Addendum)
I called mother of pt.  Jill Alexander was seen at psychiatry and they are saying they cannot refill as we did not cancel.  I called pharmacy prior to speaking to mother.  Nortriptyline did not have any refills and abilify showed #15 tabs, no refill.  These are not controlled meds.  They will reach out to psychiatry.  If still an issue may have to have provider call.  Mother relayed that there were no other medications like this that were discontinued.

## 2020-09-19 ENCOUNTER — Other Ambulatory Visit: Payer: Self-pay | Admitting: Psychiatry

## 2020-09-19 ENCOUNTER — Ambulatory Visit (INDEPENDENT_AMBULATORY_CARE_PROVIDER_SITE_OTHER): Payer: No Typology Code available for payment source | Admitting: Clinical

## 2020-09-19 ENCOUNTER — Other Ambulatory Visit (HOSPITAL_COMMUNITY): Payer: Self-pay

## 2020-09-19 DIAGNOSIS — F840 Autistic disorder: Secondary | ICD-10-CM

## 2020-09-19 MED ORDER — NORTRIPTYLINE HCL 10 MG PO CAPS
30.0000 mg | ORAL_CAPSULE | Freq: Every day | ORAL | 0 refills | Status: DC
  Filled 2020-09-19: qty 90, 30d supply, fill #0

## 2020-09-19 MED ORDER — ARIPIPRAZOLE 2 MG PO TABS
1.0000 mg | ORAL_TABLET | Freq: Every day | ORAL | 0 refills | Status: DC
  Filled 2020-09-19: qty 15, 30d supply, fill #0

## 2020-09-19 NOTE — Telephone Encounter (Signed)
Orders done by psychiatry for nortriptyline and abilify.  Mother and pt aware.

## 2020-09-19 NOTE — Telephone Encounter (Signed)
I called the [facility] and they have not heard back from the psychiatrist.

## 2020-09-25 ENCOUNTER — Ambulatory Visit: Payer: No Typology Code available for payment source | Admitting: Clinical

## 2020-09-26 ENCOUNTER — Other Ambulatory Visit (HOSPITAL_COMMUNITY): Payer: Self-pay

## 2020-09-26 ENCOUNTER — Ambulatory Visit (INDEPENDENT_AMBULATORY_CARE_PROVIDER_SITE_OTHER): Payer: No Typology Code available for payment source | Admitting: Clinical

## 2020-09-26 DIAGNOSIS — F840 Autistic disorder: Secondary | ICD-10-CM

## 2020-09-26 MED ORDER — LORAZEPAM 1 MG PO TABS
1.0000 mg | ORAL_TABLET | Freq: Two times a day (BID) | ORAL | 2 refills | Status: DC | PRN
  Filled 2020-09-26 – 2020-11-01 (×2): qty 60, 30d supply, fill #0
  Filled 2020-11-26 – 2020-11-29 (×3): qty 60, 30d supply, fill #1
  Filled 2020-12-26: qty 60, 30d supply, fill #2

## 2020-09-26 MED ORDER — LAMOTRIGINE 200 MG PO TABS
200.0000 mg | ORAL_TABLET | Freq: Two times a day (BID) | ORAL | 2 refills | Status: DC
  Filled 2020-09-26: qty 60, 30d supply, fill #0
  Filled 2020-10-25: qty 60, 30d supply, fill #1
  Filled 2020-11-26: qty 60, 30d supply, fill #2

## 2020-09-26 MED ORDER — NORTRIPTYLINE HCL 10 MG PO CAPS
30.0000 mg | ORAL_CAPSULE | Freq: Every day | ORAL | 2 refills | Status: DC
  Filled 2020-09-26 – 2020-10-15 (×2): qty 90, 30d supply, fill #0
  Filled 2020-11-13: qty 90, 30d supply, fill #1
  Filled 2020-12-11: qty 90, 30d supply, fill #2

## 2020-09-26 MED ORDER — ARIPIPRAZOLE 2 MG PO TABS
1.0000 mg | ORAL_TABLET | Freq: Every day | ORAL | 2 refills | Status: DC
  Filled 2020-09-26 – 2020-10-13 (×3): qty 15, 30d supply, fill #0
  Filled 2020-11-13: qty 15, 30d supply, fill #1
  Filled 2020-12-11: qty 15, 30d supply, fill #2

## 2020-09-26 MED ORDER — FLUOXETINE HCL 10 MG PO CAPS
30.0000 mg | ORAL_CAPSULE | Freq: Every day | ORAL | 2 refills | Status: DC
  Filled 2020-09-26 – 2020-10-15 (×3): qty 90, 30d supply, fill #0
  Filled 2020-11-17: qty 90, 30d supply, fill #1
  Filled 2020-12-18: qty 90, 30d supply, fill #2

## 2020-09-27 ENCOUNTER — Other Ambulatory Visit (HOSPITAL_COMMUNITY): Payer: Self-pay

## 2020-09-28 ENCOUNTER — Other Ambulatory Visit (HOSPITAL_COMMUNITY): Payer: Self-pay

## 2020-10-02 ENCOUNTER — Other Ambulatory Visit (HOSPITAL_COMMUNITY): Payer: Self-pay

## 2020-10-03 ENCOUNTER — Ambulatory Visit (INDEPENDENT_AMBULATORY_CARE_PROVIDER_SITE_OTHER): Payer: No Typology Code available for payment source | Admitting: Clinical

## 2020-10-03 DIAGNOSIS — F840 Autistic disorder: Secondary | ICD-10-CM

## 2020-10-04 ENCOUNTER — Encounter: Payer: Self-pay | Admitting: Gastroenterology

## 2020-10-04 ENCOUNTER — Ambulatory Visit (INDEPENDENT_AMBULATORY_CARE_PROVIDER_SITE_OTHER): Payer: No Typology Code available for payment source | Admitting: Gastroenterology

## 2020-10-04 VITALS — BP 90/64 | HR 89 | Ht 62.75 in | Wt 141.0 lb

## 2020-10-04 DIAGNOSIS — K219 Gastro-esophageal reflux disease without esophagitis: Secondary | ICD-10-CM

## 2020-10-04 DIAGNOSIS — K5900 Constipation, unspecified: Secondary | ICD-10-CM

## 2020-10-04 DIAGNOSIS — R109 Unspecified abdominal pain: Secondary | ICD-10-CM

## 2020-10-04 MED ORDER — OMEPRAZOLE 20 MG PO CPDR: 20.0000 mg | DELAYED_RELEASE_CAPSULE | Freq: Every day | ORAL | 1 refills | Status: DC

## 2020-10-04 MED ORDER — DICYCLOMINE HCL 10 MG PO CAPS: 10.0000 mg | ORAL_CAPSULE | Freq: Three times a day (TID) | ORAL | 0 refills | Status: DC

## 2020-10-04 NOTE — Patient Instructions (Signed)
If you are age 27 or older, your body mass index should be between 23-30. Your Body mass index is 25.18 kg/m. If this is out of the aforementioned range listed, please consider follow up with your Primary Care Provider.  If you are age 68 or younger, your body mass index should be between 19-25. Your Body mass index is 25.18 kg/m. If this is out of the aformentioned range listed, please consider follow up with your Primary Care Provider.   __________________________________________________________  The  GI providers would like to encourage you to use [facility] to communicate with providers for non-urgent requests or questions.  Due to long hold times on the telephone, sending your provider a message by [facility] may be a faster and more efficient way to get a response.  Please allow 48 business hours for a response.  Please remember that this is for non-urgent requests.   Please start the following medications  1. Omeprazole 20 mg one a day, if no improvement after 1-2 weeks increase to twice a day   2. Miralax once a day. Increase to twice a day as needed.   3. Bentyl 10 mg 1 every 8 hours as needed   It was a pleasure to see you today!  Thank you for trusting me with your gastrointestinal care!

## 2020-10-04 NOTE — Progress Notes (Signed)
HPI :  25 year old female with a history of autism, depression, seizure disorder, chronic nausea and dyspepsia, here for follow-up visit.  She was last seen in December 2020.  See prior notes for details of her case.  We have previously evaluated her with EGD, negative celiac serologies, negative testing for H. Pylori.  She has been on omeprazole historically.  At her last visit we discussed performing a gastric emptying study in light of some of her postprandial symptoms, however she ultimately declined to have that done.  I had recommended empiric trial of Reglan, take Zofran on a more scheduled basis, and continue omeprazole. She has been on Lamictal for very long time we have discussed in the past that this can be associated with some GI upset at times.  She has been on Pamelor for headaches and insomnia , she takes this as a low-dose 10 mg nightly and tends to tolerate it so far.  She denies any marijuana use.    Since the last visit somewhere along the line she ran out or stopped her omeprazole.  She states she tried the Reglan 5 mg 3 times daily but did not really help her too much.  Main symptom is some intermittent epigastric pain that can come and go.  She states the severity fluctuates, sometimes worse in the morning when she wakes up.  Hard to say if this is postprandial or not.  She is not having much nausea lately, typically this is more problematic for her but however this has not been bothering her recently.  She is using Zofran as needed and not scheduled.  She also has had some constipation at times, passing hard stools are hard to evacuate.  She denies any blood in her stools.  She has some lower abdominal discomfort with this and finds some relief with a bowel movement.  She had been taking some ibuprofen routinely for some headaches, has been seeing neurology and she has been tapering off NSAIDs and using a few times a week at this point.  She has found relief from her epigastric pain  with Tums, Pepcid, and omeprazole OTC as needed.  She has not been using the omeprazole routinely.    EGD 04/20/2018 - normal esophagus, biopsies taken - no evidence of EoE, normal stomach - biopsies show no H pylori, normal duodenum - biopsies show no celiac    Past Medical History:  Diagnosis Date  . Anxiety   . Autism   . Chronic nausea   . Common migraine with intractable migraine 07/01/2019  . Depression   . Dyspepsia   . GERD (gastroesophageal reflux disease)   . Headache   . Heart murmur   . Seizures (HCC)   . 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 none     Family History  Problem Relation Age of Onset  . Depression Father   . Anxiety disorder Sister   . Bipolar disorder Sister   . Diabetes Maternal Grandfather   . Cancer Maternal Grandfather   . Breast cancer Paternal Grandmother   . Uterine cancer Paternal Grandmother   . Irritable bowel syndrome Paternal Grandmother   . Colon cancer Neg Hx   . Esophageal cancer Neg Hx   . Stomach cancer Neg Hx   . Rectal cancer Neg Hx    Social History   Tobacco Use  . Smoking status: Never Smoker  . Smokeless tobacco: Never Used  Vaping Use  .  Vaping Use: Never used  Substance Use Topics  . Alcohol use: No  . Drug use: No   Current Outpatient Medications  Medication Sig Dispense Refill  . acetaminophen (TYLENOL) 500 MG tablet Take 500 mg by mouth every 6 (six) hours as needed.    . ARIPiprazole (ABILIFY) 2 MG tablet Take 1/2 tablet by mouth at bedtime 15 tablet 2  . Docusate Sodium (COLACE PO) Take by mouth.    . Famotidine (PEPCID PO) Take by mouth.    Marland Kitchen FLUoxetine (PROZAC) 10 MG capsule Take 3 capsule(s) by mouth at bedtime for anxiety 90 capsule 2  . Ibuprofen (MOTRIN PO) Take 200 mg by mouth as needed.    . lamoTRIgine (LAMICTAL) 200 MG tablet Take 1 tablet by mouth twice a day for seizures and depression 60 tablet 2  . LORazepam (ATIVAN) 1 MG tablet Take 1  tablet by mouth twice a day as needed for anxiety 60 tablet 2  . melatonin 1 MG TABS tablet Take 1 tablet (1 mg total) by mouth at bedtime as needed (insomnia). 30 tablet 0  . Multiple Vitamins-Minerals (MULTIVITAMIN PO) Take 1 tablet by mouth daily.    . Norethin Ace-Eth Estrad-FE 1-20 MG-MCG(24) CAPS TAKE 1 TABLET BY MOUTH DAILY. 28 capsule 11  . nortriptyline (PAMELOR) 10 MG capsule Take 3 capsule(s) by mouth at bedtime for sleep and headaches 90 capsule 2  . ondansetron (ZOFRAN-ODT) 8 MG disintegrating tablet Take 1 tablet (8 mg total) by mouth every 6 (six) hours as needed for nausea or vomiting. 60 tablet 1  . polyethylene glycol (MIRALAX) packet Take 17 g by mouth daily as needed. 14 each 0  . rizatriptan (MAXALT) 10 MG tablet TAKE 1 TABLET (10 MG TOTAL) BY MOUTH AS NEEDED FOR MIGRAINE. MAY REPEAT IN 2 HOURS IF NEEDED 10 tablet 11  . topiramate (TOPAMAX) 100 MG tablet Take 1 tablet (100 mg total) by mouth at bedtime. 90 tablet 3  . triamcinolone ointment (KENALOG) 0.1 % Apply 1 application topically 2 (two) times daily. 30 g 0   No current facility-administered medications for this visit.   Allergies  Allergen Reactions  . Gluten Meal      Review of Systems: All systems reviewed and negative except where noted in HPI.   Lab Results  Component Value Date   WBC 5.1 05/13/2019   HGB 13.2 05/13/2019   HCT 40.2 05/13/2019   MCV 92 05/13/2019   PLT 269 05/13/2019    Lab Results  Component Value Date   CREATININE 0.68 05/13/2019   BUN 11 05/13/2019   NA 142 05/13/2019   K 4.6 05/13/2019   CL 106 05/13/2019   CO2 22 05/13/2019    Lab Results  Component Value Date   ALT 16 05/13/2019   AST 20 05/13/2019   ALKPHOS 59 05/13/2019   BILITOT 0.2 05/13/2019     Physical Exam: BP 90/64   Pulse 89   Ht 5' 2.75" (1.594 m)   Wt 141 lb (64 kg)   BMI 25.18 kg/m  Constitutional: Pleasant,well-developed, female in no acute distress. Abdominal: Soft, nondistended, nontender.   There are no masses palpable.  Extremities: no edema Neurological: Alert and oriented to person place and time. Skin: Skin is warm and dry. No rashes noted. Psychiatric: Normal mood and affect. Behavior is normal.   ASSESSMENT AND PLAN: 25 year old female here for reassessment of the following:  GERD Abdominal pain Constipation  Longstanding dyspepsia and chronic nausea.  She has responded somewhat to  PPI and Zofran.  Trial of empiric Reglan was not to useful, she has declined gastric emptying study in the past.  EGD previously without any concerning findings.  I have not seen her since 2020, sounds like she stopped routine use of PPI and now having some periodic epigastric pain.  She does respond to antacids whether it is Tums, Pepcid, or PPI.  Recommend she resume omeprazole 20 mg once daily to twice daily for now if that helps her.  Otherwise having some constipation and lower abdominal pain that is relieved with a bowel movement, suspect related to constipation.  Recommend MiraLAX daily, she can increase to twice daily if needed.  Otherwise we will add some Bentyl 10 mg every 8 hours as needed for lower abdominal pain and see if that helps.  I asked her to contact me in a few weeks if she is not feeling any better.  She agreed with the plan, all questions answered  Plan: - resume Omeprazole 20mg  once to twice daily - start Miralax daily, increase to BID if needed - start Bentyl 10mg  every 8 hours PRN  - contact me in a few weeks if no better  , MD [facility]

## 2020-10-05 ENCOUNTER — Other Ambulatory Visit: Payer: Self-pay | Admitting: Gastroenterology

## 2020-10-05 ENCOUNTER — Other Ambulatory Visit (HOSPITAL_COMMUNITY): Payer: Self-pay

## 2020-10-05 MED ORDER — OMEPRAZOLE 20 MG PO CPDR
20.0000 mg | DELAYED_RELEASE_CAPSULE | Freq: Every day | ORAL | 1 refills | Status: DC
  Filled 2020-10-05: qty 30, 30d supply, fill #0
  Filled 2020-11-01: qty 30, 30d supply, fill #1

## 2020-10-09 ENCOUNTER — Other Ambulatory Visit (HOSPITAL_COMMUNITY): Payer: Self-pay

## 2020-10-09 ENCOUNTER — Ambulatory Visit (INDEPENDENT_AMBULATORY_CARE_PROVIDER_SITE_OTHER): Payer: No Typology Code available for payment source | Admitting: Clinical

## 2020-10-09 DIAGNOSIS — F840 Autistic disorder: Secondary | ICD-10-CM

## 2020-10-09 MED FILL — Norethindrone Ace-Ethinyl Estradiol-FE Cap 1 MG-20 MCG (24): ORAL | 28 days supply | Qty: 28 | Fill #0 | Status: CN

## 2020-10-09 MED FILL — Norethindrone Ace-Ethinyl Estradiol-FE Cap 1 MG-20 MCG (24): ORAL | 28 days supply | Qty: 28 | Fill #0 | Status: AC

## 2020-10-10 ENCOUNTER — Ambulatory Visit (INDEPENDENT_AMBULATORY_CARE_PROVIDER_SITE_OTHER): Payer: No Typology Code available for payment source | Admitting: Clinical

## 2020-10-10 DIAGNOSIS — F840 Autistic disorder: Secondary | ICD-10-CM | POA: Diagnosis not present

## 2020-10-11 ENCOUNTER — Other Ambulatory Visit (HOSPITAL_COMMUNITY): Payer: Self-pay

## 2020-10-13 ENCOUNTER — Other Ambulatory Visit (HOSPITAL_COMMUNITY): Payer: Self-pay

## 2020-10-16 ENCOUNTER — Other Ambulatory Visit: Payer: Self-pay | Admitting: Gastroenterology

## 2020-10-16 ENCOUNTER — Other Ambulatory Visit (HOSPITAL_COMMUNITY): Payer: Self-pay

## 2020-10-16 MED ORDER — DICYCLOMINE HCL 10 MG PO CAPS
10.0000 mg | ORAL_CAPSULE | Freq: Three times a day (TID) | ORAL | 1 refills | Status: DC
  Filled 2020-10-16: qty 120, 30d supply, fill #0
  Filled 2020-12-11: qty 120, 30d supply, fill #1

## 2020-10-17 ENCOUNTER — Ambulatory Visit (INDEPENDENT_AMBULATORY_CARE_PROVIDER_SITE_OTHER): Payer: No Typology Code available for payment source | Admitting: Clinical

## 2020-10-17 ENCOUNTER — Other Ambulatory Visit (HOSPITAL_COMMUNITY): Payer: Self-pay

## 2020-10-17 DIAGNOSIS — F840 Autistic disorder: Secondary | ICD-10-CM | POA: Diagnosis not present

## 2020-10-23 ENCOUNTER — Ambulatory Visit (INDEPENDENT_AMBULATORY_CARE_PROVIDER_SITE_OTHER): Payer: No Typology Code available for payment source | Admitting: Clinical

## 2020-10-23 DIAGNOSIS — F840 Autistic disorder: Secondary | ICD-10-CM | POA: Diagnosis not present

## 2020-10-24 ENCOUNTER — Ambulatory Visit (INDEPENDENT_AMBULATORY_CARE_PROVIDER_SITE_OTHER): Payer: No Typology Code available for payment source | Admitting: Clinical

## 2020-10-24 DIAGNOSIS — F840 Autistic disorder: Secondary | ICD-10-CM | POA: Diagnosis not present

## 2020-10-25 ENCOUNTER — Encounter: Payer: Self-pay | Admitting: Internal Medicine

## 2020-10-25 ENCOUNTER — Other Ambulatory Visit (HOSPITAL_COMMUNITY): Payer: Self-pay

## 2020-10-30 MED FILL — Norethindrone Ace-Ethinyl Estradiol-FE Cap 1 MG-20 MCG (24): ORAL | 28 days supply | Qty: 28 | Fill #1 | Status: AC

## 2020-10-31 ENCOUNTER — Other Ambulatory Visit (HOSPITAL_COMMUNITY): Payer: Self-pay

## 2020-10-31 ENCOUNTER — Ambulatory Visit (INDEPENDENT_AMBULATORY_CARE_PROVIDER_SITE_OTHER): Payer: No Typology Code available for payment source | Admitting: Clinical

## 2020-10-31 DIAGNOSIS — F840 Autistic disorder: Secondary | ICD-10-CM

## 2020-11-01 ENCOUNTER — Other Ambulatory Visit (HOSPITAL_COMMUNITY): Payer: Self-pay

## 2020-11-02 ENCOUNTER — Other Ambulatory Visit (HOSPITAL_COMMUNITY): Payer: Self-pay

## 2020-11-07 ENCOUNTER — Ambulatory Visit: Payer: No Typology Code available for payment source | Admitting: Clinical

## 2020-11-13 ENCOUNTER — Other Ambulatory Visit (HOSPITAL_COMMUNITY): Payer: Self-pay

## 2020-11-14 ENCOUNTER — Ambulatory Visit (INDEPENDENT_AMBULATORY_CARE_PROVIDER_SITE_OTHER): Payer: No Typology Code available for payment source | Admitting: Clinical

## 2020-11-14 ENCOUNTER — Encounter: Payer: No Typology Code available for payment source | Admitting: Family Medicine

## 2020-11-14 ENCOUNTER — Other Ambulatory Visit (HOSPITAL_COMMUNITY): Payer: Self-pay

## 2020-11-14 DIAGNOSIS — F840 Autistic disorder: Secondary | ICD-10-CM

## 2020-11-20 ENCOUNTER — Other Ambulatory Visit (HOSPITAL_COMMUNITY): Payer: Self-pay

## 2020-11-20 ENCOUNTER — Ambulatory Visit (INDEPENDENT_AMBULATORY_CARE_PROVIDER_SITE_OTHER): Payer: No Typology Code available for payment source | Admitting: Clinical

## 2020-11-20 DIAGNOSIS — F840 Autistic disorder: Secondary | ICD-10-CM

## 2020-11-21 ENCOUNTER — Ambulatory Visit (INDEPENDENT_AMBULATORY_CARE_PROVIDER_SITE_OTHER): Payer: No Typology Code available for payment source | Admitting: Clinical

## 2020-11-21 DIAGNOSIS — F840 Autistic disorder: Secondary | ICD-10-CM | POA: Diagnosis not present

## 2020-11-27 ENCOUNTER — Other Ambulatory Visit (HOSPITAL_COMMUNITY): Payer: Self-pay

## 2020-11-27 ENCOUNTER — Other Ambulatory Visit: Payer: Self-pay | Admitting: Gastroenterology

## 2020-11-27 MED ORDER — OMEPRAZOLE 20 MG PO CPDR
20.0000 mg | DELAYED_RELEASE_CAPSULE | Freq: Every day | ORAL | 5 refills | Status: DC
Start: 2020-11-27 — End: 2023-02-13
  Filled 2020-11-27: qty 30, 30d supply, fill #0

## 2020-11-27 MED FILL — Norethindrone Ace-Ethinyl Estradiol-FE Cap 1 MG-20 MCG (24): ORAL | 28 days supply | Qty: 28 | Fill #2 | Status: AC

## 2020-11-28 ENCOUNTER — Ambulatory Visit (INDEPENDENT_AMBULATORY_CARE_PROVIDER_SITE_OTHER): Payer: No Typology Code available for payment source | Admitting: Clinical

## 2020-11-28 DIAGNOSIS — F840 Autistic disorder: Secondary | ICD-10-CM | POA: Diagnosis not present

## 2020-11-29 ENCOUNTER — Other Ambulatory Visit (HOSPITAL_COMMUNITY): Payer: Self-pay

## 2020-12-11 ENCOUNTER — Other Ambulatory Visit (HOSPITAL_COMMUNITY): Payer: Self-pay

## 2020-12-13 ENCOUNTER — Other Ambulatory Visit (HOSPITAL_COMMUNITY): Payer: Self-pay

## 2020-12-18 ENCOUNTER — Other Ambulatory Visit (HOSPITAL_COMMUNITY): Payer: Self-pay

## 2020-12-18 ENCOUNTER — Ambulatory Visit: Payer: No Typology Code available for payment source | Admitting: Clinical

## 2020-12-22 ENCOUNTER — Other Ambulatory Visit (HOSPITAL_COMMUNITY): Payer: Self-pay

## 2020-12-22 ENCOUNTER — Ambulatory Visit: Payer: No Typology Code available for payment source | Admitting: Clinical

## 2020-12-22 ENCOUNTER — Ambulatory Visit (INDEPENDENT_AMBULATORY_CARE_PROVIDER_SITE_OTHER): Payer: No Typology Code available for payment source | Admitting: Clinical

## 2020-12-22 DIAGNOSIS — F840 Autistic disorder: Secondary | ICD-10-CM

## 2020-12-25 ENCOUNTER — Other Ambulatory Visit (HOSPITAL_COMMUNITY): Payer: Self-pay

## 2020-12-25 MED FILL — Norethindrone Ace-Ethinyl Estradiol-FE Cap 1 MG-20 MCG (24): ORAL | 28 days supply | Qty: 28 | Fill #3 | Status: AC

## 2020-12-26 ENCOUNTER — Ambulatory Visit (INDEPENDENT_AMBULATORY_CARE_PROVIDER_SITE_OTHER): Payer: No Typology Code available for payment source | Admitting: Clinical

## 2020-12-26 DIAGNOSIS — F840 Autistic disorder: Secondary | ICD-10-CM

## 2020-12-27 ENCOUNTER — Other Ambulatory Visit (HOSPITAL_COMMUNITY): Payer: Self-pay

## 2020-12-29 ENCOUNTER — Other Ambulatory Visit (HOSPITAL_COMMUNITY): Payer: Self-pay

## 2021-01-02 ENCOUNTER — Ambulatory Visit (INDEPENDENT_AMBULATORY_CARE_PROVIDER_SITE_OTHER): Payer: No Typology Code available for payment source | Admitting: Clinical

## 2021-01-02 DIAGNOSIS — F840 Autistic disorder: Secondary | ICD-10-CM

## 2021-01-03 ENCOUNTER — Other Ambulatory Visit (HOSPITAL_COMMUNITY): Payer: Self-pay

## 2021-01-03 MED ORDER — NORTRIPTYLINE HCL 10 MG PO CAPS
30.0000 mg | ORAL_CAPSULE | Freq: Every day | ORAL | 1 refills | Status: DC
  Filled 2021-01-03 – 2021-01-08 (×2): qty 90, 30d supply, fill #0
  Filled 2021-02-09: qty 90, 30d supply, fill #1

## 2021-01-03 MED ORDER — FLUOXETINE HCL 10 MG PO CAPS
30.0000 mg | ORAL_CAPSULE | Freq: Every day | ORAL | 1 refills | Status: DC
  Filled 2021-01-03 – 2021-01-17 (×2): qty 90, 30d supply, fill #0
  Filled 2021-02-19: qty 90, 30d supply, fill #1

## 2021-01-03 MED ORDER — LAMOTRIGINE 200 MG PO TABS
200.0000 mg | ORAL_TABLET | Freq: Two times a day (BID) | ORAL | 1 refills | Status: DC
  Filled 2021-01-03: qty 60, 30d supply, fill #0
  Filled 2021-02-05: qty 60, 30d supply, fill #1

## 2021-01-03 MED ORDER — ARIPIPRAZOLE 2 MG PO TABS
1.0000 mg | ORAL_TABLET | Freq: Every day | ORAL | 0 refills | Status: DC
  Filled 2021-01-03 – 2021-01-19 (×2): qty 15, 30d supply, fill #0

## 2021-01-03 MED ORDER — LORAZEPAM 1 MG PO TABS
1.0000 mg | ORAL_TABLET | Freq: Two times a day (BID) | ORAL | 1 refills | Status: DC | PRN
  Filled 2021-01-24: qty 60, 30d supply, fill #0
  Filled 2021-04-02: qty 60, 30d supply, fill #1

## 2021-01-08 ENCOUNTER — Other Ambulatory Visit (HOSPITAL_COMMUNITY): Payer: Self-pay

## 2021-01-09 ENCOUNTER — Ambulatory Visit (INDEPENDENT_AMBULATORY_CARE_PROVIDER_SITE_OTHER): Payer: No Typology Code available for payment source | Admitting: Clinical

## 2021-01-09 DIAGNOSIS — F840 Autistic disorder: Secondary | ICD-10-CM

## 2021-01-15 ENCOUNTER — Encounter: Payer: Self-pay | Admitting: Neurology

## 2021-01-15 ENCOUNTER — Ambulatory Visit (INDEPENDENT_AMBULATORY_CARE_PROVIDER_SITE_OTHER): Payer: No Typology Code available for payment source | Admitting: Clinical

## 2021-01-15 DIAGNOSIS — F840 Autistic disorder: Secondary | ICD-10-CM | POA: Diagnosis not present

## 2021-01-16 ENCOUNTER — Encounter: Payer: Self-pay | Admitting: Neurology

## 2021-01-16 ENCOUNTER — Ambulatory Visit (INDEPENDENT_AMBULATORY_CARE_PROVIDER_SITE_OTHER): Payer: No Typology Code available for payment source | Admitting: Neurology

## 2021-01-16 ENCOUNTER — Ambulatory Visit: Payer: No Typology Code available for payment source | Admitting: Clinical

## 2021-01-16 ENCOUNTER — Other Ambulatory Visit (HOSPITAL_COMMUNITY): Payer: Self-pay

## 2021-01-16 ENCOUNTER — Ambulatory Visit: Payer: Self-pay | Admitting: Clinical

## 2021-01-16 VITALS — BP 105/68 | HR 68 | Ht 63.0 in | Wt 136.8 lb

## 2021-01-16 DIAGNOSIS — Z5181 Encounter for therapeutic drug level monitoring: Secondary | ICD-10-CM

## 2021-01-16 DIAGNOSIS — G43019 Migraine without aura, intractable, without status migrainosus: Secondary | ICD-10-CM

## 2021-01-16 MED ORDER — TOPIRAMATE 50 MG PO TABS
150.0000 mg | ORAL_TABLET | Freq: Every day | ORAL | 1 refills | Status: DC
  Filled 2021-01-16: qty 270, 90d supply, fill #0
  Filled 2021-04-13: qty 270, 90d supply, fill #1

## 2021-01-16 NOTE — Telephone Encounter (Signed)
I spoke to the patient. She has been getting more frequent migraines. Currently, taking nortriptyline 10mg , 3 cap QHS and topiramate 100mg , one tab QHS for prevention. Rizatriptan 10mg  for rescue that is sometimes helpful. She may have to repeat the dose. At times, she may also follow the triptan with NSAIDS. Laying down to rest is often beneficial.   She has been accepted to a . Orientation starts at the end of September. Classes start October 3rd. She would like to get better control of her migraines prior to starting her courses.  She is asking for an earlier follow up. Dr. Office manager had an opening today at noon. She will arrive at11:30am for check-in.

## 2021-01-16 NOTE — Patient Instructions (Signed)
We will go up on the topamax to 150 mg at night.  Topamax (topiramate) is a seizure medication that has an FDA approval for seizures and for migraine headache. Potential side effects of this medication include weight loss, cognitive slowing, tingling in the fingers and toes, and carbonated drinks will taste bad. If any significant side effects are noted on this drug, please contact our office.

## 2021-01-16 NOTE — Progress Notes (Signed)
Reason for visit: Migraine headache, intractable  Jill Alexander is an 25 y.o. female  History of present illness:  Jill Alexander is a 25 year old left-handed white female with a history of intractable migraine headaches.  The patient has mild autism and a history of seizures.  She is on Topamax and lamotrigine currently.  She has not had any recent seizure events.  Her headaches over the last several weeks have become more frequent, she is having almost daily headaches.  She denies that she is under any increased stress.  She oftentimes will wake up with a headache.  She indicates that she drinks 2 to 3 cups of coffee daily.  She will take Maxalt if needed.  She denies any nausea or vomiting with the headache but does have some photophobia and phonophobia.  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  She thinks that she sleeps well but she does have ongoing problems with fatigue during the day, this is a chronic issue for her.  She returns to the office today for an evaluation.  She also remains on low-dose nortriptyline.  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    Past Surgical History:  Procedure Laterality Date   none      Family History  Problem Relation Age of Onset   Depression Father    Anxiety disorder Sister    Bipolar disorder Sister    Diabetes Maternal Grandfather    Cancer Maternal Grandfather    Breast cancer Paternal Grandmother    Uterine cancer Paternal Grandmother    Irritable bowel syndrome Paternal Grandmother    Colon cancer Neg Hx    Esophageal cancer Neg Hx    Stomach cancer Neg Hx    Rectal cancer Neg Hx     Social history:  reports that she has never smoked. She has  never used smokeless tobacco. She reports that she does not drink alcohol and does not use drugs.    Allergies  Allergen Reactions   Gluten Meal     Medications:  Prior to Admission medications   Medication Sig Start Date End Date Taking? Authorizing Provider  acetaminophen (TYLENOL) 500 MG tablet Take 500 mg by mouth every 6 (six) hours as needed.   Yes [provider]  ARIPiprazole (ABILIFY) 2 MG tablet Take 0.5 tablets (1 mg total) by mouth at bedtime. 01/03/21  Yes   dicyclomine (BENTYL) 10 MG capsule Take 1 capsule (10 mg total) by mouth 4 (four) times daily before meals and at bedtime. Patient taking differently: Take 10 mg by mouth in the morning and at bedtime. 10/16/20  Yes Armbruster, Willaim Rayas, MD  Docusate Sodium (COLACE PO) Take by mouth.   Yes [provider]  Famotidine (PEPCID PO) Take by mouth.   Yes [provider]  FLUoxetine (PROZAC) 10 MG capsule Take 3 capsules (30 mg total) by mouth daily. 01/03/21  Yes   Ibuprofen (MOTRIN PO) Take 200 mg by mouth as needed.   Yes [provider]  lamoTRIgine (LAMICTAL) 200 MG tablet Take 1 tablet (200 mg total) by mouth 2 (two) times daily. 01/03/21  Yes  LORazepam (ATIVAN) 1 MG tablet Take 1 tablet (1 mg total) by mouth 2 (two) times daily as needed. 01/03/21  Yes   melatonin 1 MG TABS tablet Take 1 tablet (1 mg total) by mouth at bedtime as needed (insomnia). 10/07/19  Yes Oletta Darter, MD  Multiple Vitamins-Minerals (MULTIVITAMIN PO) Take 1 tablet by mouth daily.   Yes [provider]  Norethin Ace-Eth Estrad-FE 1-20 MG-MCG(24) CAPS TAKE 1 CAPSULE BY MOUTH DAILY. 03/29/20 03/29/21 Yes Rasch, Harolyn Rutherford, NP  nortriptyline (PAMELOR) 10 MG capsule Take 3 capsules (30 mg total) by mouth daily. 01/03/21  Yes   omeprazole (PRILOSEC) 20 MG capsule Take 1 capsule (20 mg total) by mouth daily. 11/27/20  Yes Armbruster, Willaim Rayas, MD  polyethylene glycol [facility]) packet Take 17 g by mouth daily as needed.  11/19/17  Yes Armbruster, Willaim Rayas, MD  rizatriptan (MAXALT) 10 MG tablet TAKE 1 TABLET (10 MG TOTAL) BY MOUTH AS NEEDED FOR MIGRAINE. MAY REPEAT IN 2 HOURS IF NEEDED 08/21/20  Yes Glean Salvo, NP  topiramate (TOPAMAX) 100 MG tablet Take 1 tablet (100 mg total) by mouth at bedtime. 08/21/20  Yes Glean Salvo, NP  ondansetron (ZOFRAN-ODT) 8 MG disintegrating tablet Take 1 tablet (8 mg total) by mouth every 6 (six) hours as needed for nausea or vomiting. Patient not taking: Reported on 01/16/2021 04/19/19   Benancio Deeds, MD  triamcinolone ointment (KENALOG) 0.1 % Apply 1 application topically 2 (two) times daily. Patient not taking: Reported on 01/16/2021 06/27/20   Avanell Shackleton, NP-C    ROS:  Out of a complete 14 system review of symptoms, the patient complains only of the following symptoms, and all other reviewed systems are negative.  Headache Fatigue  Blood pressure 105/68, pulse 68, height 5\' 3"  (1.6 m), weight 136 lb 12.8 oz (62.1 kg).  Physical Exam  General: The patient is alert and cooperative at the time of the examination.  Skin: No significant peripheral edema is noted.   Neurologic Exam  Mental status: The patient is alert and oriented x 3 at the time of the examination. The patient has apparent normal recent and remote memory, with an apparently normal attention span and concentration ability.   Cranial nerves: Facial symmetry is present. Speech is normal, no aphasia or dysarthria is noted. Extraocular movements are full. Visual fields are full.  Motor: The patient has good strength in all 4 extremities.  Sensory examination: Soft touch sensation is symmetric on the face, arms, and legs.  Coordination: The patient has good finger-nose-finger and heel-to-shin bilaterally.  The patient appears to have some apraxia with use of the extremities.  Gait and station: The patient has a normal gait. Tandem gait is normal. Romberg is negative. No drift is  seen.  Reflexes: Deep tendon reflexes are symmetric.   Assessment/Plan:  1.  Intractable migraine  2.  History of seizures, well controlled  The patient will go up on the Topamax dose to 150 mg at night.  She will try to reduce her caffeine intake.  She is having early morning headaches which could be rebound type events.  She will contact our office if she is not improving with her symptoms over the next several weeks.  We may consider Aimovig or Ajovy for her headaches at that point.  The patient otherwise will follow-up in 4 months, in the future she can be followed through Dr. .  Delena Bali MD 01/16/2021 12:10 PM  [facility] [phone]  [address] [place], [place] [address]  Phone [phone] Fax [phone]

## 2021-01-17 ENCOUNTER — Other Ambulatory Visit (HOSPITAL_COMMUNITY): Payer: Self-pay

## 2021-01-17 MED FILL — Norethindrone Ace-Ethinyl Estradiol-FE Cap 1 MG-20 MCG (24): ORAL | 28 days supply | Qty: 28 | Fill #4 | Status: AC

## 2021-01-18 ENCOUNTER — Other Ambulatory Visit (HOSPITAL_COMMUNITY): Payer: Self-pay

## 2021-01-19 ENCOUNTER — Other Ambulatory Visit (HOSPITAL_COMMUNITY): Payer: Self-pay

## 2021-01-23 ENCOUNTER — Ambulatory Visit (INDEPENDENT_AMBULATORY_CARE_PROVIDER_SITE_OTHER): Payer: No Typology Code available for payment source | Admitting: Clinical

## 2021-01-23 DIAGNOSIS — F840 Autistic disorder: Secondary | ICD-10-CM | POA: Diagnosis not present

## 2021-01-25 ENCOUNTER — Other Ambulatory Visit (HOSPITAL_COMMUNITY): Payer: Self-pay

## 2021-01-26 ENCOUNTER — Other Ambulatory Visit: Payer: Self-pay

## 2021-01-26 ENCOUNTER — Ambulatory Visit (INDEPENDENT_AMBULATORY_CARE_PROVIDER_SITE_OTHER): Payer: No Typology Code available for payment source | Admitting: Family Medicine

## 2021-01-26 ENCOUNTER — Encounter: Payer: Self-pay | Admitting: Family Medicine

## 2021-01-26 ENCOUNTER — Encounter: Payer: No Typology Code available for payment source | Admitting: Family Medicine

## 2021-01-26 VITALS — BP 100/60 | HR 85 | Ht 62.5 in | Wt 135.2 lb

## 2021-01-26 DIAGNOSIS — Z23 Encounter for immunization: Secondary | ICD-10-CM

## 2021-01-26 DIAGNOSIS — F419 Anxiety disorder, unspecified: Secondary | ICD-10-CM

## 2021-01-26 DIAGNOSIS — R5383 Other fatigue: Secondary | ICD-10-CM | POA: Diagnosis not present

## 2021-01-26 DIAGNOSIS — F32A Depression, unspecified: Secondary | ICD-10-CM | POA: Diagnosis not present

## 2021-01-26 DIAGNOSIS — Z0000 Encounter for general adult medical examination without abnormal findings: Secondary | ICD-10-CM

## 2021-01-26 DIAGNOSIS — R350 Frequency of micturition: Secondary | ICD-10-CM | POA: Diagnosis not present

## 2021-01-26 LAB — POCT URINALYSIS DIP (PROADVANTAGE DEVICE)
Bilirubin, UA: NEGATIVE
Blood, UA: NEGATIVE
Glucose, UA: NEGATIVE mg/dL
Ketones, POC UA: NEGATIVE mg/dL
Leukocytes, UA: NEGATIVE
Nitrite, UA: NEGATIVE
Protein Ur, POC: NEGATIVE mg/dL
Specific Gravity, Urine: 1.015
Urobilinogen, Ur: 0.2
pH, UA: 6.5 (ref 5.0–8.0)

## 2021-01-26 NOTE — Patient Instructions (Signed)
Preventive Care 21-27 Years Old, Female Preventive care refers to lifestyle choices and visits with your health care provider that can promote health and wellness. This includes: A yearly physical exam. This is also called an annual wellness visit. Regular dental and eye exams. Immunizations. Screening for certain conditions. Healthy lifestyle choices, such as: Eating a healthy diet. Getting regular exercise. Not using drugs or products that contain nicotine and tobacco. Limiting alcohol use. What can I expect for my preventive care visit? Physical exam Your health care provider may check your: Height and weight. These may be used to calculate your BMI (body mass index). BMI is a measurement that tells if you are at a healthy weight. Heart rate and blood pressure. Body temperature. Skin for abnormal spots. Counseling Your health care provider may ask you questions about your: Past medical problems. Family's medical history. Alcohol, tobacco, and drug use. Emotional well-being. Home life and relationship well-being. Sexual activity. Diet, exercise, and sleep habits. Work and work environment. Access to firearms. Method of birth control. Menstrual cycle. Pregnancy history. What immunizations do I need? Vaccines are usually given at various ages, according to a schedule. Your health care provider will recommend vaccines for you based on your age, medical history, and lifestyle or other factors, such as travel or where you work. What tests do I need? Blood tests Lipid and cholesterol levels. These may be checked every 5 years starting at age 20. Hepatitis C test. Hepatitis B test. Screening Diabetes screening. This is done by checking your blood sugar (glucose) after you have not eaten for a while (fasting). STD (sexually transmitted disease) testing, if you are at risk. BRCA-related cancer screening. This may be done if you have a family history of breast, ovarian, tubal, or  peritoneal cancers. Pelvic exam and Pap test. This may be done every 3 years starting at age 21. Starting at age 30, this may be done every 5 years if you have a Pap test in combination with an HPV test. Talk with your health care provider about your test results, treatment options, and if necessary, the need for more tests. Follow these instructions at home: Eating and drinking  Eat a healthy diet that includes fresh fruits and vegetables, whole grains, lean protein, and low-fat dairy products. Take vitamin and mineral supplements as recommended by your health care provider. Do not drink alcohol if: Your health care provider tells you not to drink. You are pregnant, may be pregnant, or are planning to become pregnant. If you drink alcohol: Limit how much you have to 0-1 drink a day. Be aware of how much alcohol is in your drink. In the [place] one drink equals one 12 oz bottle of beer (355 mL), one 5 oz glass of wine (148 mL), or one 1 oz glass of hard liquor (44 mL). Lifestyle Take daily care of your teeth and gums. Brush your teeth every morning and night with fluoride toothpaste. Floss one time each day. Stay active. Exercise for at least 30 minutes 5 or more days each week. Do not use any products that contain nicotine or tobacco, such as cigarettes, e-cigarettes, and chewing tobacco. If you need help quitting, ask your health care provider. Do not use drugs. If you are sexually active, practice safe sex. Use a condom or other form of protection to prevent STIs (sexually transmitted infections). If you do not wish to become pregnant, use a form of birth control. If you plan to become pregnant, see your health care provider   for a prepregnancy visit. Find healthy ways to cope with stress, such as: Meditation, yoga, or listening to music. Journaling. Talking to a trusted person. Spending time with friends and family. Safety Always wear your seat belt while driving or riding in a  vehicle. Do not drive: If you have been drinking alcohol. Do not ride with someone who has been drinking. When you are tired or distracted. While texting. Wear a helmet and other protective equipment during sports activities. If you have firearms in your house, make sure you follow all gun safety procedures. Seek help if you have been physically or sexually abused. What's next? Go to your health care provider once a year for an annual wellness visit. Ask your health care provider how often you should have your eyes and teeth checked. Stay up to date on all vaccines. This information is not intended to replace advice given to you by your health care provider. Make sure you discuss any questions you have with your health care provider. Document Revised: 06/23/2020 Document Reviewed: 12/25/2017 Elsevier Patient Education  2022 [employer]  

## 2021-01-26 NOTE — Progress Notes (Signed)
Subjective:    Patient ID: Jill Alexander, female    DOB: [birth year], 25 y.o.   MRN: [id number]  HPI Chief Complaint  Patient presents with   Annual Exam   She is here for a complete physical exam.  Other providers: OB/GYN  Psychiatrist- Dr. Michae Kava  Neuro- Dr. Charna [facility]  - Dr. Rene Kocher GI- Dr. Adela Lank Cardiology - Dr. Anne Fu   Her concerns today:  Fatigue worsening since our last visit.  States she is not sure if her increased stress and anxiety are causing fatigue.  No new medications in the past few months.   Frequent urination with a history of this. She has been on "google" and thinks she may have an "anxious bladder".   Social history: Lives with mother, works as at a country club, accepted to a Aeronautical engineer. Denies smoking, drinking alcohol, drug use  Diet: fairly healthy   Excerise: walking more often   Immunizations: flu shot today. She wants a Covid booster today also   Health maintenance:   Last Gynecological Exam: UTD  Last Menstrual cycle: not with her current birth control.  Last Dental Exam: more than 1 year and looking for a new dentist  Last Eye Exam: 2 months ago   Wears seatbelt always, uses sunscreen, smoke detectors in home and functioning, does not text while driving and feels safe in home environment.   Reviewed allergies, medications, past medical, surgical, family, and social history.     Review of Systems Review of Systems Constitutional: -fever, -chills, -sweats, -unexpected weight change,+fatigue ENT: -runny nose, -ear pain, -sore throat Cardiology:  -chest pain, -palpitations, -edema Respiratory: -cough, -shortness of breath, -wheezing Gastroenterology: -abdominal pain, -nausea, -vomiting, -diarrhea, -constipation Hematology: -bleeding or bruising problems Musculoskeletal: -arthralgias, -myalgias, -joint swelling, -back pain Ophthalmology: -vision changes Urology: -dysuria, -difficulty  urinating, -hematuria, +urinary frequency, -urgency Neurology: -headache, -weakness, -tingling, -numbness       Objective:   Physical Exam BP 100/60 (BP Location: Right Arm)   Pulse 85   Ht 5' 2.5" (1.588 m)   Wt 135 lb 3.2 oz (61.3 kg)   SpO2 98%   BMI 24.33 kg/m   General Appearance:    Alert, cooperative, no distress, appears stated age  Head:    Normocephalic, without obvious abnormality, atraumatic  Eyes:    PERRL, conjunctiva/corneas clear, EOM's intact  Ears:    Normal TM's and external ear canals  Nose:   Mask on   Throat:   Mask on   Neck:   Supple, no lymphadenopathy;  thyroid:  no   enlargement/tenderness/nodules; no JVD  Back:    Spine nontender, ROM normal, no CVA  tenderness  Lungs:     Clear to auscultation bilaterally without wheezes, rales or     ronchi; respirations unlabored  Chest Wall:    No tenderness or deformity   Heart:    Regular rate and rhythm, S1 and S2 normal, no murmur, rub   or gallop  Breast Exam:    OB/GYN  Abdomen:     Soft, non-tender, nondistended, normoactive bowel sounds,    no masses, no hepatosplenomegaly  Genitalia:    OB/GYN   Rectal:    Not performed due to age<40 and no related complaints  Extremities:   No clubbing, cyanosis or edema  Pulses:   2+ and symmetric all extremities  Skin:   Skin color, texture, turgor normal, no rashes or lesions  Lymph nodes:   Cervical, supraclavicular, and axillary nodes  normal  Neurologic:   CNII-XII intact, normal strength, sensation and gait          Psych:   Normal mood, affect, hygiene and grooming.       Assessment & Plan:  Routine general medical examination at a health care facility - Plan: CBC with Differential/Platelet, Comprehensive metabolic panel, TSH, T4, free, T3 -Preventive health care reviewed.  She sees her OB/GYN.  Counseled on healthy lifestyle including diet and exercise.  Recommend regular dental and eye exams.  Discussed safety and health promotion.  Immunizations  reviewed.  Urinary frequency - Plan: POCT Urinalysis DIP (Proadvantage Device) -UA negative. Consider seeing urologist if symptoms persist.   Fatigue, unspecified type - Plan: CBC with Differential/Platelet, Comprehensive metabolic panel, TSH, T4, free, T3, Iron, TIBC and Ferritin Panel, VITAMIN D 25 Hydroxy (Vit-D Deficiency, Fractures), Vitamin B12 -Discussed multiple etiologies for fatigue.  Discussed that if we find no physiological explanation that increased stress and anxiety may be playing a significant role  Anxiety and depression -followed by psychiatrist and counselor   Needs flu shot - Plan: Flu Vaccine QUAD 6+ mos PF IM (Fluarix Quad PF)  Need for COVID-19 vaccine - Plan: Research officer, trade union

## 2021-01-27 LAB — TSH: TSH: 1.11 u[IU]/mL (ref 0.450–4.500)

## 2021-01-27 LAB — CBC WITH DIFFERENTIAL/PLATELET
Basophils Absolute: 0 10*3/uL (ref 0.0–0.2)
Basos: 0 %
EOS (ABSOLUTE): 0.1 10*3/uL (ref 0.0–0.4)
Eos: 1 %
Hematocrit: 43.3 % (ref 34.0–46.6)
Hemoglobin: 14.3 g/dL (ref 11.1–15.9)
Immature Grans (Abs): 0 10*3/uL (ref 0.0–0.1)
Immature Granulocytes: 0 %
Lymphocytes Absolute: 1.7 10*3/uL (ref 0.7–3.1)
Lymphs: 37 %
MCH: 30.8 pg (ref 26.6–33.0)
MCHC: 33 g/dL (ref 31.5–35.7)
MCV: 93 fL (ref 79–97)
Monocytes Absolute: 0.4 10*3/uL (ref 0.1–0.9)
Monocytes: 9 %
Neutrophils Absolute: 2.4 10*3/uL (ref 1.4–7.0)
Neutrophils: 53 %
Platelets: 303 10*3/uL (ref 150–450)
RBC: 4.64 x10E6/uL (ref 3.77–5.28)
RDW: 12.3 % (ref 11.7–15.4)
WBC: 4.6 10*3/uL (ref 3.4–10.8)

## 2021-01-27 LAB — COMPREHENSIVE METABOLIC PANEL
ALT: 14 IU/L (ref 0–32)
AST: 18 IU/L (ref 0–40)
Albumin/Globulin Ratio: 2.5 — ABNORMAL HIGH (ref 1.2–2.2)
Albumin: 4.8 g/dL (ref 3.9–5.0)
Alkaline Phosphatase: 73 IU/L (ref 44–121)
BUN/Creatinine Ratio: 14 (ref 9–23)
BUN: 12 mg/dL (ref 6–20)
Bilirubin Total: 0.2 mg/dL (ref 0.0–1.2)
CO2: 17 mmol/L — ABNORMAL LOW (ref 20–29)
Calcium: 9.5 mg/dL (ref 8.7–10.2)
Chloride: 106 mmol/L (ref 96–106)
Creatinine, Ser: 0.84 mg/dL (ref 0.57–1.00)
Globulin, Total: 1.9 g/dL (ref 1.5–4.5)
Glucose: 94 mg/dL (ref 70–99)
Potassium: 4.4 mmol/L (ref 3.5–5.2)
Sodium: 139 mmol/L (ref 134–144)
Total Protein: 6.7 g/dL (ref 6.0–8.5)
eGFR: 98 mL/min/{1.73_m2} (ref >59)

## 2021-01-27 LAB — T3: T3, Total: 136 ng/dL (ref 71–180)

## 2021-01-27 LAB — IRON,TIBC AND FERRITIN PANEL
Ferritin: 21 ng/mL (ref 15–150)
Iron Saturation: 28 % (ref 15–55)
Iron: 110 ug/dL (ref 27–159)
Total Iron Binding Capacity: 389 ug/dL (ref 250–450)
UIBC: 279 ug/dL (ref 131–425)

## 2021-01-27 LAB — VITAMIN D 25 HYDROXY (VIT D DEFICIENCY, FRACTURES): Vit D, 25-Hydroxy: 57.3 ng/mL (ref 30.0–100.0)

## 2021-01-27 LAB — VITAMIN B12: Vitamin B-12: 491 pg/mL (ref 232–1245)

## 2021-01-27 LAB — T4, FREE: Free T4: 1.26 ng/dL (ref 0.82–1.77)

## 2021-01-28 ENCOUNTER — Encounter: Payer: Self-pay | Admitting: Family Medicine

## 2021-01-29 ENCOUNTER — Ambulatory Visit (INDEPENDENT_AMBULATORY_CARE_PROVIDER_SITE_OTHER): Payer: No Typology Code available for payment source | Admitting: Clinical

## 2021-01-29 DIAGNOSIS — F840 Autistic disorder: Secondary | ICD-10-CM | POA: Diagnosis not present

## 2021-01-30 ENCOUNTER — Ambulatory Visit: Payer: No Typology Code available for payment source | Admitting: Clinical

## 2021-02-03 ENCOUNTER — Other Ambulatory Visit (HOSPITAL_COMMUNITY): Payer: Self-pay

## 2021-02-05 ENCOUNTER — Other Ambulatory Visit (HOSPITAL_COMMUNITY): Payer: Self-pay

## 2021-02-06 ENCOUNTER — Encounter: Payer: Self-pay | Admitting: Family Medicine

## 2021-02-06 ENCOUNTER — Ambulatory Visit: Payer: No Typology Code available for payment source | Admitting: Clinical

## 2021-02-07 ENCOUNTER — Ambulatory Visit: Payer: No Typology Code available for payment source | Admitting: Gastroenterology

## 2021-02-09 ENCOUNTER — Other Ambulatory Visit (HOSPITAL_COMMUNITY): Payer: Self-pay

## 2021-02-09 ENCOUNTER — Other Ambulatory Visit: Payer: Self-pay

## 2021-02-09 DIAGNOSIS — R350 Frequency of micturition: Secondary | ICD-10-CM

## 2021-02-09 DIAGNOSIS — R3915 Urgency of urination: Secondary | ICD-10-CM

## 2021-02-12 ENCOUNTER — Ambulatory Visit (INDEPENDENT_AMBULATORY_CARE_PROVIDER_SITE_OTHER): Payer: No Typology Code available for payment source | Admitting: Clinical

## 2021-02-12 DIAGNOSIS — F840 Autistic disorder: Secondary | ICD-10-CM

## 2021-02-13 ENCOUNTER — Encounter: Payer: Self-pay | Admitting: Urology

## 2021-02-13 ENCOUNTER — Ambulatory Visit (INDEPENDENT_AMBULATORY_CARE_PROVIDER_SITE_OTHER): Payer: No Typology Code available for payment source | Admitting: Clinical

## 2021-02-13 DIAGNOSIS — F840 Autistic disorder: Secondary | ICD-10-CM

## 2021-02-19 ENCOUNTER — Other Ambulatory Visit: Payer: Self-pay | Admitting: Obstetrics and Gynecology

## 2021-02-19 ENCOUNTER — Other Ambulatory Visit (HOSPITAL_COMMUNITY): Payer: Self-pay

## 2021-02-19 DIAGNOSIS — N926 Irregular menstruation, unspecified: Secondary | ICD-10-CM

## 2021-02-20 ENCOUNTER — Ambulatory Visit (INDEPENDENT_AMBULATORY_CARE_PROVIDER_SITE_OTHER): Payer: No Typology Code available for payment source | Admitting: Clinical

## 2021-02-20 ENCOUNTER — Other Ambulatory Visit (HOSPITAL_COMMUNITY): Payer: Self-pay

## 2021-02-20 ENCOUNTER — Other Ambulatory Visit: Payer: Self-pay | Admitting: Obstetrics and Gynecology

## 2021-02-20 DIAGNOSIS — F840 Autistic disorder: Secondary | ICD-10-CM | POA: Diagnosis not present

## 2021-02-20 DIAGNOSIS — N926 Irregular menstruation, unspecified: Secondary | ICD-10-CM

## 2021-02-20 MED ORDER — FLUOXETINE HCL 10 MG PO CAPS: 30.0000 mg | ORAL_CAPSULE | Freq: Every day | ORAL | 0 refills | Status: DC

## 2021-02-20 MED ORDER — ARIPIPRAZOLE 2 MG PO TABS
1.0000 mg | ORAL_TABLET | Freq: Every day | ORAL | 0 refills | Status: DC
  Filled 2021-02-20: qty 15, 30d supply, fill #0

## 2021-02-20 MED ORDER — LORAZEPAM 1 MG PO TABS
1.0000 mg | ORAL_TABLET | Freq: Two times a day (BID) | ORAL | 0 refills | Status: DC | PRN
  Filled 2021-02-20 – 2021-03-03 (×2): qty 60, 30d supply, fill #0

## 2021-02-20 MED ORDER — LAMOTRIGINE 200 MG PO TABS
200.0000 mg | ORAL_TABLET | Freq: Two times a day (BID) | ORAL | 0 refills | Status: DC
  Filled 2021-02-20 – 2021-03-02 (×2): qty 60, 30d supply, fill #0

## 2021-02-20 MED ORDER — NORTRIPTYLINE HCL 10 MG PO CAPS
30.0000 mg | ORAL_CAPSULE | Freq: Every day | ORAL | 0 refills | Status: DC
  Filled 2021-02-20 – 2021-06-20 (×3): qty 90, 30d supply, fill #0

## 2021-02-21 ENCOUNTER — Ambulatory Visit: Payer: No Typology Code available for payment source | Admitting: Urology

## 2021-02-23 ENCOUNTER — Other Ambulatory Visit (HOSPITAL_COMMUNITY): Payer: Self-pay

## 2021-02-23 ENCOUNTER — Other Ambulatory Visit: Payer: Self-pay

## 2021-02-23 MED FILL — Norethindrone Ace-Ethinyl Estradiol-FE Cap 1 MG-20 MCG (24): ORAL | 84 days supply | Qty: 84 | Fill #0 | Status: AC

## 2021-02-26 ENCOUNTER — Ambulatory Visit: Payer: No Typology Code available for payment source | Admitting: Urology

## 2021-02-26 ENCOUNTER — Ambulatory Visit: Payer: No Typology Code available for payment source | Admitting: Clinical

## 2021-02-27 ENCOUNTER — Ambulatory Visit (INDEPENDENT_AMBULATORY_CARE_PROVIDER_SITE_OTHER): Payer: No Typology Code available for payment source | Admitting: Clinical

## 2021-02-27 DIAGNOSIS — F840 Autistic disorder: Secondary | ICD-10-CM

## 2021-03-02 ENCOUNTER — Other Ambulatory Visit (HOSPITAL_COMMUNITY): Payer: Self-pay

## 2021-03-05 ENCOUNTER — Other Ambulatory Visit (HOSPITAL_COMMUNITY): Payer: Self-pay

## 2021-03-06 ENCOUNTER — Ambulatory Visit (INDEPENDENT_AMBULATORY_CARE_PROVIDER_SITE_OTHER): Payer: No Typology Code available for payment source | Admitting: Clinical

## 2021-03-06 DIAGNOSIS — F840 Autistic disorder: Secondary | ICD-10-CM

## 2021-03-12 ENCOUNTER — Other Ambulatory Visit (HOSPITAL_COMMUNITY): Payer: Self-pay

## 2021-03-12 ENCOUNTER — Ambulatory Visit: Payer: No Typology Code available for payment source | Admitting: Clinical

## 2021-03-13 ENCOUNTER — Ambulatory Visit (INDEPENDENT_AMBULATORY_CARE_PROVIDER_SITE_OTHER): Payer: No Typology Code available for payment source | Admitting: Clinical

## 2021-03-13 DIAGNOSIS — F840 Autistic disorder: Secondary | ICD-10-CM | POA: Diagnosis not present

## 2021-03-15 ENCOUNTER — Other Ambulatory Visit (HOSPITAL_COMMUNITY): Payer: Self-pay

## 2021-03-15 MED ORDER — ARIPIPRAZOLE 2 MG PO TABS
1.0000 mg | ORAL_TABLET | Freq: Every day | ORAL | 1 refills | Status: DC
  Filled 2021-03-15 – 2021-03-23 (×2): qty 15, 30d supply, fill #0
  Filled 2021-04-16: qty 15, 30d supply, fill #1

## 2021-03-15 MED ORDER — LAMOTRIGINE 200 MG PO TABS
ORAL_TABLET | ORAL | 1 refills | Status: DC
  Filled 2021-03-15 – 2021-03-30 (×2): qty 60, 30d supply, fill #0
  Filled 2021-05-02: qty 60, 30d supply, fill #1

## 2021-03-15 MED ORDER — NORTRIPTYLINE HCL 10 MG PO CAPS
30.0000 mg | ORAL_CAPSULE | Freq: Every day | ORAL | 1 refills | Status: DC
  Filled 2021-03-15: qty 90, 30d supply, fill #0
  Filled 2021-04-16: qty 90, 30d supply, fill #1

## 2021-03-15 MED ORDER — FLUOXETINE HCL 10 MG PO CAPS
30.0000 mg | ORAL_CAPSULE | Freq: Every day | ORAL | 1 refills | Status: DC
  Filled 2021-03-15: qty 90, 30d supply, fill #0
  Filled 2021-04-16: qty 90, 30d supply, fill #1

## 2021-03-19 ENCOUNTER — Telehealth: Payer: Self-pay | Admitting: Family Medicine

## 2021-03-19 DIAGNOSIS — L309 Dermatitis, unspecified: Secondary | ICD-10-CM

## 2021-03-19 NOTE — Telephone Encounter (Signed)
Triamcinolone ointment attempted this year for eczema, spoke to patient and she states this did not work. Patient states she tried an OTC cream and did not work.  Dermatology referral placed since has attempted treatment

## 2021-03-19 NOTE — Telephone Encounter (Signed)
Pt mom called and states that pt needs a referral for a dermatologist for her eczema she would like to go to [facility] in [place], She has to the [employer] cone focus plan,

## 2021-03-23 ENCOUNTER — Other Ambulatory Visit (HOSPITAL_COMMUNITY): Payer: Self-pay

## 2021-03-26 ENCOUNTER — Ambulatory Visit: Payer: No Typology Code available for payment source | Admitting: Clinical

## 2021-03-26 ENCOUNTER — Ambulatory Visit: Payer: No Typology Code available for payment source | Admitting: Gastroenterology

## 2021-03-26 NOTE — Progress Notes (Signed)
No show letter

## 2021-03-27 ENCOUNTER — Telehealth: Payer: No Typology Code available for payment source | Admitting: Physician Assistant

## 2021-03-27 ENCOUNTER — Ambulatory Visit: Payer: No Typology Code available for payment source | Admitting: Clinical

## 2021-03-27 DIAGNOSIS — R399 Unspecified symptoms and signs involving the genitourinary system: Secondary | ICD-10-CM | POA: Diagnosis not present

## 2021-03-27 MED ORDER — CEPHALEXIN 500 MG PO CAPS
500.0000 mg | ORAL_CAPSULE | Freq: Two times a day (BID) | ORAL | 0 refills | Status: AC
  Filled 2021-03-27: qty 14, 7d supply, fill #0

## 2021-03-27 NOTE — Progress Notes (Signed)
I have spent 5 minutes in review of e-visit questionnaire, review and updating patient chart, medical decision making and response to patient.   Nareg Breighner Cody Dewon Mendizabal, PA-C    

## 2021-03-27 NOTE — Progress Notes (Signed)

## 2021-03-28 ENCOUNTER — Other Ambulatory Visit (HOSPITAL_COMMUNITY): Payer: Self-pay

## 2021-03-30 ENCOUNTER — Other Ambulatory Visit (HOSPITAL_COMMUNITY): Payer: Self-pay

## 2021-04-02 ENCOUNTER — Other Ambulatory Visit (HOSPITAL_COMMUNITY): Payer: Self-pay

## 2021-04-03 ENCOUNTER — Other Ambulatory Visit (HOSPITAL_COMMUNITY): Payer: Self-pay

## 2021-04-03 ENCOUNTER — Ambulatory Visit: Payer: No Typology Code available for payment source | Admitting: Clinical

## 2021-04-03 MED ORDER — LORAZEPAM 1 MG PO TABS
1.0000 mg | ORAL_TABLET | Freq: Two times a day (BID) | ORAL | 1 refills | Status: DC | PRN
  Filled 2021-04-03 – 2021-05-02 (×2): qty 60, 30d supply, fill #0
  Filled 2021-05-28: qty 60, 30d supply, fill #1

## 2021-04-09 ENCOUNTER — Ambulatory Visit: Payer: No Typology Code available for payment source | Admitting: Clinical

## 2021-04-10 ENCOUNTER — Ambulatory Visit: Payer: No Typology Code available for payment source | Admitting: Clinical

## 2021-04-12 ENCOUNTER — Ambulatory Visit (INDEPENDENT_AMBULATORY_CARE_PROVIDER_SITE_OTHER): Payer: No Typology Code available for payment source | Admitting: Psychology

## 2021-04-12 DIAGNOSIS — F840 Autistic disorder: Secondary | ICD-10-CM | POA: Diagnosis not present

## 2021-04-12 NOTE — Progress Notes (Signed)
[facility] Counselor Initial Adult Exam  Name: Alzora Ha Date: 04/12/2021 MRN: [id number] DOB: 1996-01-10 PCP: Jake Shark, PA-C  Time spent: 1:42pm-2:36pm  Pt is seen for a virtual audio visit via the telephone.  Pt was unable to join via webex today reporting technology issues.  Pt joins from her home and reports privacy and counselor from her office.    Guardian/Payee:  self    Reason for Visit /Presenting Problem: [name] has been in counseling with Dr. Dewayne Hatch since Sept 2019 for the tx of Autism Spectrum D/O and related struggles w/ emotional regulation, anxiety and worry.  [name] was facing a lapse in tx due to scheduling difficulties between [name] and Dr. Dewayne Hatch and therefore referred for temporary counseling with this provider during their gap of treatment.  [name] reports that she struggles w/ a lot of worry, anxiety, and negative self talk.    Mental Status Exam: Appearance:   NA     Behavior:  Appropriate  Motor:  N/a  Speech/Language:   Normal Rate  Affect:  Appropriate  Mood:  normal  Thought process:  normal  Thought content:    WNL  Sensory/Perceptual disturbances:    WNL  Orientation:  oriented to person, place, time/date, and situation  Attention:  Good  Concentration:  Good  Memory:  WNL  Fund of knowledge:   Good  Insight:    Good  Judgment:   Good  Impulse Control:  Good    Risk Assessment: Danger to Self:  No Self-injurious Behavior: No Danger to Others: No Duty to Warn:no Physical Aggression / Violence:No  Access to Firearms a concern: No  Gang Involvement:No  Patient / guardian was educated about steps to take if suicide or homicide risk level increases between visits: n/a While future psychiatric events cannot be accurately predicted, the patient does not currently require acute inpatient psychiatric care and does not currently meet [facility] involuntary commitment criteria.  Substance Abuse History: Current  substance abuse: No     Past Psychiatric History:   Previous psychological history is significant for ASD Outpatient Providers:Pt has been working with Dr. Dewayne Hatch since 12/2017 for increasing emotional regulation. History of Psych Hospitalization:  Partial Hospitalization in 2015 Psychological Testing:  dx of ASD    Abuse History:  Victim of: No.,  none reported    Victim of Neglect:No. Witness / Exposure to Domestic Violence: No   Protective Services Involvement: No  Witness to [employer] Violence:  No   Family History:  Family History  Problem Relation Age of Onset   Depression Father    Anxiety disorder Sister    Bipolar disorder Sister    Diabetes Maternal Grandfather    Cancer Maternal Grandfather    Breast cancer Paternal Grandmother    Uterine cancer Paternal Grandmother    Irritable bowel syndrome Paternal Grandmother    Colon cancer Neg Hx    Esophageal cancer Neg Hx    Stomach cancer Neg Hx    Rectal cancer Neg Hx     Living situation: the patient lives with her parents, dog and cat.  Pt grew up in [place] and moved to [place] in 2018.    Sexual Orientation:  not reported  Relationship Status: single, no children  Support Systems: parents Best friend, her dog, sister   Financial Stress:  No   Income/Employment/Disability: Supported by [employer] and [employer] and Research scientist (physical sciences) through [employer]: No   Educational History: Education: Administrator, sports graduated high school.  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   Religion/Sprituality/World View: Not reported  Recreation/Hobbies: reading  Stressors: Other: coping w/ ASD, emotional regulation and negative self talk    Strengths: Supportive Relationships, [employer], and increasing self awareness and seeking tx  Barriers:  scheduling issues.     Legal History: Pending legal issue / charges: The patient has no significant history of legal  issues. 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     ARIPiprazole (ABILIFY) 2 MG tablet Take 0.5 tablets (1 mg total) by mouth at bedtime. 15 tablet 1   dicyclomine (BENTYL) 10 MG capsule Take 1 capsule (10 mg total) by mouth 4 (four) times daily before meals and at bedtime. (Patient taking differently: Take 10 mg by mouth in the morning and at bedtime.) 120 capsule 1   Docusate Sodium (COLACE PO) Take by mouth.     Famotidine (PEPCID PO) Take by mouth.     FLUoxetine (PROZAC) 10 MG capsule Take 3 capsules (30 mg total) by mouth daily. 90 capsule 1   FLUoxetine (PROZAC) 10 MG capsule Take 3 capsule(s) by mouth at bedtime for anxiety 90 capsule 0   FLUoxetine (PROZAC) 10 MG capsule Take 3 capsules (30 mg total) by mouth at bedtime for anxiety 90 capsule 1   Ibuprofen (MOTRIN PO) Take 200 mg by mouth as needed.     lamoTRIgine (LAMICTAL) 200 MG tablet Take 1 tablet by mouth twice a day for seizures and depression 60 tablet 1   LORazepam (ATIVAN) 1 MG tablet Take 1 tablet (1 mg total) by mouth 2 (two) times daily as needed. 60 tablet 1   LORazepam (ATIVAN) 1 MG tablet Take 1 tablet (1 mg total) by mouth 2 (two) times daily as needed for anxiety 60 tablet 0   LORazepam (ATIVAN) 1 MG tablet Take 1 tablet (1 mg total) by mouth 2 (two) times daily as needed for anxiety 60 tablet 1   melatonin 1 MG TABS tablet Take 1 tablet (1 mg total) by  mouth at bedtime as needed (insomnia). 30 tablet 0   Multiple Vitamins-Minerals (MULTIVITAMIN PO) Take 1 tablet by mouth daily.     nortriptyline (PAMELOR) 10 MG capsule Take 3 capsules (30 mg total) by mouth at bedtime for sleep and headaches 90 capsule 0   nortriptyline (PAMELOR) 10 MG capsule Take 3 capsules (30 mg total) by mouth at bedtime for sleep and headaches 90 capsule 1   omeprazole (PRILOSEC) 20 MG capsule Take 1 capsule (20 mg total) by mouth daily. 30 capsule 5   polyethylene glycol (MIRALAX) packet Take 17 g by mouth daily as needed. 14 each 0   rizatriptan (MAXALT) 10 MG tablet TAKE 1 TABLET (10 MG TOTAL) BY MOUTH AS NEEDED FOR MIGRAINE. MAY REPEAT IN 2 HOURS IF NEEDED 10 tablet 11  Norethin Ace-Eth Estrad-FE (TAYTULLA) 1-20 MG-MCG(24) CAPS TAKE 1 CAPSULE BY MOUTH DAILY. 28 capsule 11   topiramate (TOPAMAX) 50 MG tablet Take 3 tablets (150 mg total) by mouth at bedtime. 270 tablet 1   No current facility-administered medications for this visit.    Allergies  Allergen Reactions   Gluten Meal     Diagnoses:  Autism, current or active  Plan of Care: Pt to f/u w/ this provider for weekly counseling during the lapse of tx w/ Dr. Dewayne Hatch until Feb 2023.  Pt to f/u w/ PCP and prescribing provider as scheduled.   Individualized Treatment Plan Strengths: Enjoys reading, Seeking counseling, In training program through [employer], "I like humor and jokes"  Supports: Her dog, her bestfriend since childhood, her parents, her sister   Goal/Needs for Treatment:  In order of importance to patient 1) Emotional Regulation/Anxiety 2) Improve Positive self talk    Client Statement of Needs: "Find ways of being nicer to myself", cope w/ anxiety and worry   Treatment Level:Outpatient Counseling  Symptoms:Anxiety, Perseverative worry, Negative self talk  Client Treatment Preferences:weekly counseling, return to Dr. Dewayne Hatch in Feb 2023 Prefers to be called [name]   Healthcare  consumer's goal for treatment:  [name], [facility] will support the patient's ability to achieve the goals identified. Cognitive Behavioral Therapy, Supportive Counseling, Mindfulness and Relaxation Strategies and other evidenced-based practices will be used to promote progress towards healthy functioning.   Healthcare consumer will: Actively participate in therapy, working towards healthy functioning.    *Justification for Continuation/Discontinuation of Goal: R=Revised, O=Ongoing, A=Achieved, D=Discontinued  Goal 1) [name] will develop strategies to regulate her emotions and improve her positive self talk Likert rating baseline date 04/12/21: How Easy - 3; How Often - 50% Target Date Goal Was reviewed Status Code Progress towards goal/Likert rating  04/12/22  New Maintained             [name] participated in development of tx plan and provided verbal consent.   [name], [facility]

## 2021-04-16 ENCOUNTER — Other Ambulatory Visit (HOSPITAL_COMMUNITY): Payer: Self-pay

## 2021-04-25 ENCOUNTER — Ambulatory Visit: Payer: No Typology Code available for payment source | Admitting: Neurology

## 2021-04-30 ENCOUNTER — Ambulatory Visit (HOSPITAL_COMMUNITY)
Admission: EM | Admit: 2021-04-30 | Discharge: 2021-04-30 | Disposition: A | Discharge status: Home / Self Care | Payer: No Typology Code available for payment source | Attending: Internal Medicine | Admitting: Internal Medicine

## 2021-04-30 ENCOUNTER — Encounter (HOSPITAL_COMMUNITY): Payer: Self-pay

## 2021-04-30 ENCOUNTER — Other Ambulatory Visit: Payer: Self-pay

## 2021-04-30 DIAGNOSIS — R350 Frequency of micturition: Secondary | ICD-10-CM

## 2021-04-30 LAB — POCT URINALYSIS DIPSTICK, ED / UC
Bilirubin Urine: NEGATIVE
Glucose, UA: NEGATIVE mg/dL
Hgb urine dipstick: NEGATIVE
Ketones, ur: NEGATIVE mg/dL
Leukocytes,Ua: NEGATIVE
Nitrite: NEGATIVE
Protein, ur: NEGATIVE mg/dL
Specific Gravity, Urine: 1.025 (ref 1.005–1.030)
Urobilinogen, UA: 0.2 mg/dL (ref 0.0–1.0)
pH: 7 (ref 5.0–8.0)

## 2021-04-30 NOTE — ED Provider Notes (Signed)
[facility]    CSN: [id number] Arrival date & time: 04/30/21  1414      History   Chief Complaint Chief Complaint  Patient presents with   Urinary Frequency    HPI Jill Alexander is a 26 y.o. female with a past medical history of urinary frequency comes to urgent care with same complaints of urinary frequency and urgency.  Patient was seen by urologist in the past and recommended pelvic floor exercises.  Patient has not been able to go for pelvic floor exercises because of her school schedule.  She denies any burning sensation with urination.  No vaginal discharge.  Patient is not sexually active.  No abdominal pain, nausea or vomiting.  No flank pain.  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    Patient Active Problem List   Diagnosis Date Noted   Common migraine with intractable migraine 07/01/2019   Intermittent headache 10/26/2018   Epigastric pain 10/26/2018   Gastroesophageal reflux disease 10/26/2018   Bipolar 2 disorder (HCC) 06/02/2018   Panic attacks 06/02/2018   Autism spectrum disorder 05/06/2018   Anxiety and depression 05/06/2018   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 Authorizing Provider  acetaminophen  (TYLENOL) 500 MG tablet Take 500 mg by mouth every 6 (six) hours as needed.    [provider]  ARIPiprazole (ABILIFY) 2 MG tablet Take 0.5 tablets (1 mg total) by mouth at bedtime. 03/15/21     dicyclomine (BENTYL) 10 MG capsule Take 1 capsule (10 mg total) by mouth 4 (four) times daily before meals and at bedtime. Patient taking differently: Take 10 mg by mouth in the morning and at bedtime. 10/16/20   Armbruster, Willaim Rayas, MD  Docusate Sodium (COLACE PO) Take by mouth.    [provider]  Famotidine (PEPCID PO) Take by mouth.    [provider]  FLUoxetine (PROZAC) 10 MG capsule Take 3 capsules (30 mg total) by mouth daily. 01/03/21     FLUoxetine (PROZAC) 10 MG capsule Take 3 capsule(s) by mouth at bedtime for anxiety 02/20/21     FLUoxetine (PROZAC) 10 MG capsule Take 3 capsules (30 mg total) by mouth at bedtime for anxiety 03/15/21     Ibuprofen (MOTRIN PO) Take 200 mg by mouth as needed.    [provider]  lamoTRIgine (LAMICTAL) 200 MG tablet Take 1 tablet by mouth twice a day  for seizures and depression 03/15/21     LORazepam (ATIVAN) 1 MG tablet Take 1 tablet (1 mg total) by mouth 2 (two) times daily as needed. 01/03/21     LORazepam (ATIVAN) 1 MG tablet Take 1 tablet (1 mg total) by mouth 2 (two) times daily as needed for anxiety 02/20/21     LORazepam (ATIVAN) 1 MG tablet Take 1 tablet (1 mg total) by mouth 2 (two) times daily as needed for anxiety 04/03/21     melatonin 1 MG TABS tablet Take 1 tablet (1 mg total) by mouth at bedtime as needed (insomnia). 10/07/19   Oletta DarterAgarwal, [place], MD  Multiple Vitamins-Minerals (MULTIVITAMIN PO) Take 1 tablet by mouth daily.    [provider]  nortriptyline (PAMELOR) 10 MG capsule Take 3 capsules (30 mg total) by mouth at bedtime for sleep and headaches 02/20/21     nortriptyline (PAMELOR) 10 MG capsule Take 3 capsules (30 mg total) by mouth at bedtime for sleep and headaches 03/15/21     omeprazole (PRILOSEC) 20  MG capsule Take 1 capsule (20 mg total) by mouth daily. 11/27/20   Armbruster, Willaim RayasSteven P, MD  polyethylene glycol [facility](MIRALAX) packet Take 17 g by mouth daily as needed. 11/19/17   Armbruster, Willaim RayasSteven P, MD  rizatriptan (MAXALT) 10 MG tablet TAKE 1 TABLET (10 MG TOTAL) BY MOUTH AS NEEDED FOR MIGRAINE. MAY REPEAT IN 2 HOURS IF NEEDED 08/21/20   Glean SalvoSlack, Sarah J, NP  Norethin Ace-Eth Estrad-FE (TAYTULLA) 1-20 MG-MCG(24) CAPS TAKE 1 CAPSULE BY MOUTH DAILY. 02/23/21   Reva BoresPratt, Tanya S, MD  topiramate (TOPAMAX) 50 MG tablet Take 3 tablets (150 mg total) by mouth at bedtime. 01/16/21   York SpanielWillis, Charles K, MD    Family History Family History  Problem Relation Age of Onset   Depression Father    Anxiety disorder Sister    Bipolar disorder Sister    Diabetes Maternal Grandfather    Cancer Maternal Grandfather    Breast cancer Paternal Grandmother    Uterine cancer Paternal Grandmother    Irritable bowel syndrome Paternal Grandmother    Colon cancer Neg Hx    Esophageal cancer Neg Hx    Stomach cancer Neg Hx    Rectal cancer Neg Hx     Social History Social History   Tobacco Use   Smoking status: Never   Smokeless tobacco: Never  Vaping Use   Vaping Use: Never used  Substance Use Topics   Alcohol use: No   Drug use: No     Allergies   Gluten meal   Review of Systems Review of Systems  Constitutional:  Negative for chills and fever.  Gastrointestinal:  Negative for abdominal pain.  Genitourinary:  Positive for frequency and urgency. Negative for dysuria, flank pain, vaginal bleeding, vaginal discharge and vaginal pain.    Physical Exam Triage Vital Signs ED Triage Vitals  Enc Vitals Group     BP 04/30/21 1759 106/70     Pulse Rate 04/30/21 1759 71     Resp 04/30/21 1759 19     Temp 04/30/21 1759 98.1 F (36.7 C)     Temp src --      SpO2 04/30/21 1759 100 %     Weight --      Height --      Head Circumference --      Peak Flow --      Pain Score 04/30/21 1758 0     Pain Loc --  Pain Edu? --      Excl. in GC? --    No data found.  Updated Vital Signs BP 106/70    Pulse 71    Temp 98.1 F (36.7 C)    Resp 19    SpO2 100%   Visual Acuity Right Eye Distance:   Left Eye Distance:   Bilateral Distance:    Right Eye Near:   Left Eye Near:    Bilateral Near:     Physical Exam Vitals and nursing note reviewed.  Constitutional:      General: She is not in acute distress.    Appearance: She is not ill-appearing.  Cardiovascular:     Rate and Rhythm: Normal rate and regular rhythm.  Pulmonary:     Effort: Pulmonary effort is normal.     Breath sounds: Normal breath sounds.  Abdominal:     General: Bowel sounds are normal.     Palpations: Abdomen is soft.  Neurological:     Mental Status: She is alert.     UC Treatments / Results  Labs (all labs ordered are listed, but only abnormal results are displayed) Labs Reviewed  POCT URINALYSIS DIPSTICK, ED / UC    EKG   Radiology No results found.  Procedures Procedures (including critical care time)  Medications Ordered in UC Medications - No data to display  Initial Impression / Assessment and Plan / UC Course  I have reviewed the triage vital signs and the nursing notes.  Pertinent labs & imaging results that were available during my care of the patient were reviewed by me and considered in my medical decision making (see chart for details).     1.  Urinary frequency: Point-of-care urinalysis is negative for urinary tract infection Patient advised to follow-up with physical therapy for pelvic floor exercises Return precautions given. Final Clinical Impressions(s) / UC Diagnoses   Final diagnoses:  Urinary frequency     Discharge Instructions      I recommend you reach out to the physical therapy team for pelvic floor exercises as recommended by the urologist Your urine is completely normal. If you have any other concerns please return to urgent care to be reevaluated.   ED  Prescriptions   None    PDMP not reviewed this encounter.   Merrilee Jansky, MD 04/30/21 [id number]

## 2021-04-30 NOTE — Discharge Instructions (Addendum)
I recommend you reach out to the physical therapy team for pelvic floor exercises as recommended by the urologist Your urine is completely normal. If you have any other concerns please return to urgent care to be reevaluated.

## 2021-04-30 NOTE — ED Triage Notes (Signed)
Pt presents with complaints of urinary frequency x 2 months. Concerned for uti.

## 2021-05-02 ENCOUNTER — Other Ambulatory Visit (HOSPITAL_COMMUNITY): Payer: Self-pay

## 2021-05-02 MED FILL — Norethindrone Ace-Ethinyl Estradiol-FE Cap 1 MG-20 MCG (24): ORAL | 84 days supply | Qty: 84 | Fill #1 | Status: AC

## 2021-05-03 ENCOUNTER — Ambulatory Visit (INDEPENDENT_AMBULATORY_CARE_PROVIDER_SITE_OTHER): Payer: No Typology Code available for payment source | Admitting: Psychology

## 2021-05-03 DIAGNOSIS — F840 Autistic disorder: Secondary | ICD-10-CM | POA: Diagnosis not present

## 2021-05-03 NOTE — Progress Notes (Signed)
[facility] Counselor/Therapist Progress Note  Patient ID: Sommer Spickard, MRN: [id number],    Date: 05/03/2021  Time Spent: 1:34pm-2:30pm   Treatment Type: Individual Therapy  Pt was seen for a virtual video visit via webex.  Pt joins from her home and counselor from her home office.   Reported Symptoms: anxiety increased, negative self talk  Mental Status Exam: Appearance:  Well Groomed     Behavior: Appropriate  Motor: Normal  Speech/Language:  Normal Rate  Affect: Appropriate  Mood: anxious  Thought process: tangential  Thought content:   WNL  Sensory/Perceptual disturbances:   WNL  Orientation: oriented to person, place, time/date, and situation  Attention: Good  Concentration: Good  Memory: WNL  Fund of knowledge:  Good  Insight:   Good  Judgment:  Good  Impulse Control: Good   Risk Assessment: Danger to Self:  No Self-injurious Behavior: No Danger to Others: No Duty to Warn:no Physical Aggression / Violence:No  Access to Firearms a concern: No  Gang Involvement:No   Subjective: Counselor assessed pt current functioning per pt report. Processed w/pt report of increased anxiety and contributing factors.  Had pt identify thoughts patterns and self talk contributing.  Discussed self compassion and gave resource of self-compassion.org. Assisted w/ reframing and identifying statements of self compassion.  Pt affect wnl.  Pt reported increased anxiety recent.  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  Pt was aware of negative self talk that is discouraging and increasing anxiety.  Pt was able to challenge and reframe and identify self compassion "place holder" for negative self talk.   Interventions: Cognitive Behavioral Therapy and self compassion and positive self talk.  Diagnosis:Autism, current or active  Plan: Pt to f/u w/ this provider for weekly counseling  during the lapse of tx w/ Dr. Dewayne Hatch until Feb 2023.  Pt to f/u w/ PCP and prescribing provider as scheduled.   Individualized Treatment Plan Strengths: Enjoys reading, Seeking counseling, In training program through [employer], "I like humor and jokes"  Supports: Her dog, her bestfriend since childhood, her parents, her sister   Goal/Needs for Treatment:  In order of importance to patient 1) Emotional Regulation/Anxiety 2) Improve Positive self talk    Client Statement of Needs: "Find ways of being nicer to myself", cope w/ anxiety and worry   Treatment Level:Outpatient Counseling  Symptoms:Anxiety, Perseverative worry, Negative self talk  Client Treatment Preferences:weekly counseling, return to Dr. Dewayne Hatch in Feb 2023 Prefers to be called [name]   Healthcare consumer's goal for treatment:  [name], [facility] will support the patient's ability to achieve the goals identified. Cognitive Behavioral Therapy, Supportive Counseling, Mindfulness and Relaxation Strategies and other evidenced-based practices will be used to promote progress towards healthy functioning.   Healthcare consumer will: Actively participate in therapy, working towards healthy functioning.    *Justification for Continuation/Discontinuation of Goal: R=Revised, O=Ongoing, A=Achieved, D=Discontinued  Goal 1) [name] will develop strategies to regulate her emotions and improve her positive self talk Likert rating baseline date 04/12/21: How Easy - 3; How Often - 50% Target Date Goal Was reviewed Status Code Progress towards goal/Likert rating  04/12/22  New Maintained             [name] participated in development of tx plan and provided verbal consent.   [name], [facility]

## 2021-05-04 ENCOUNTER — Other Ambulatory Visit (HOSPITAL_COMMUNITY): Payer: Self-pay

## 2021-05-04 MED ORDER — BETAMETHASONE DIPROPIONATE 0.05 % EX CREA
1.0000 "application " | TOPICAL_CREAM | Freq: Two times a day (BID) | CUTANEOUS | 3 refills | Status: DC
  Filled 2021-05-04: qty 45, 20d supply, fill #0
  Filled 2021-05-31: qty 45, 20d supply, fill #1

## 2021-05-04 MED ORDER — DESONIDE 0.05 % EX CREA
1.0000 "application " | TOPICAL_CREAM | CUTANEOUS | 3 refills | Status: DC
  Filled 2021-05-04: qty 60, 20d supply, fill #0
  Filled 2021-05-04 (×2): qty 60, 30d supply, fill #0

## 2021-05-07 ENCOUNTER — Other Ambulatory Visit (HOSPITAL_COMMUNITY): Payer: Self-pay

## 2021-05-07 ENCOUNTER — Ambulatory Visit: Payer: No Typology Code available for payment source | Admitting: Clinical

## 2021-05-07 MED ORDER — ALCLOMETASONE DIPROPIONATE 0.05 % EX CREA
TOPICAL_CREAM | CUTANEOUS | 3 refills | Status: DC
  Filled 2021-05-07: qty 60, 30d supply, fill #0

## 2021-05-10 ENCOUNTER — Ambulatory Visit (INDEPENDENT_AMBULATORY_CARE_PROVIDER_SITE_OTHER): Payer: No Typology Code available for payment source | Admitting: Psychology

## 2021-05-10 DIAGNOSIS — F840 Autistic disorder: Secondary | ICD-10-CM | POA: Diagnosis not present

## 2021-05-10 NOTE — Progress Notes (Signed)
[facility] Counselor/Therapist Progress Note  Patient ID: Jill Alexander, MRN: [id number],    Date: 05/10/2021  Time Spent: 3:30pm-4:24pm   Treatment Type: Individual Therapy  Pt was seen for a virtual video visit via webex.  Pt joins from her home and counselor from her home office.   Reported Symptoms: anxiety, panic attack, increased headaches, negative self talk  Mental Status Exam: Appearance:  Well Groomed     Behavior: Appropriate  Motor: Normal  Speech/Language:  Normal Rate  Affect: Appropriate  Mood: anxious  Thought process: tangential  Thought content:   WNL  Sensory/Perceptual disturbances:   Headache   Orientation: oriented to person, place, time/date, and situation  Attention: Good  Concentration: Good  Memory: WNL  Fund of knowledge:  Good  Insight:   Good  Judgment:  Good  Impulse Control: Good   Risk Assessment: Danger to Self:  No Self-injurious Behavior: No Danger to Others: No Duty to Warn:no Physical Aggression / Violence:No  Access to Firearms a concern: No  Gang Involvement:No   Subjective: Counselor assessed pt current functioning per pt report. Processed w/pt report of anxiety, panic attack and increased headaches.  Explored pt practice of self compassion and reframing negative self talk. Discussed continue practiced towards change, reflected practice had made and assisted w/ identifying other opportunities.  Pt affect consistent w/ report of headache that did improve over session.  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  Pt was able to identify contributing factors and including distortions and self talk.  Pt discussed support she sought out and received from others. Pt was able to reflect on "how it ws a tough day but she did get through it". Pt reported that she has been working on positive self talk and journaling. Pt acknowledged how needs to continue to consisently practice skills to  progress.  Interventions: Cognitive Behavioral Therapy and self compassion and positive self talk.  Diagnosis:Autism, current or active  Plan: Pt to f/u w/ this provider for weekly counseling during the lapse of tx w/ Dr. Dewayne Hatch until Feb 2023.  Pt to f/u w/ PCP and prescribing provider as scheduled.   Individualized Treatment Plan Strengths: Enjoys reading, Seeking counseling, In training program through [employer], "I like humor and jokes"  Supports: Her dog, her bestfriend since childhood, her parents, her sister   Goal/Needs for Treatment:  In order of importance to patient 1) Emotional Regulation/Anxiety 2) Improve Positive self talk    Client Statement of Needs: "Find ways of being nicer to myself", cope w/ anxiety and worry   Treatment Level:Outpatient Counseling  Symptoms:Anxiety, Perseverative worry, Negative self talk  Client Treatment Preferences:weekly counseling, return to Dr. Dewayne Hatch in Feb 2023 Prefers to be called Jill Alexander   Healthcare consumer's goal for treatment:  [name], [facility] will support the patient's ability to achieve the goals identified. Cognitive Behavioral Therapy, Supportive Counseling, Mindfulness and Relaxation Strategies and other evidenced-based practices will be used to promote progress towards healthy functioning.   Healthcare consumer will: Actively participate in therapy, working towards healthy functioning.    *Justification for Continuation/Discontinuation of Goal: R=Revised, O=Ongoing, A=Achieved, D=Discontinued  Goal 1) Jill Alexander will develop strategies to regulate her emotions and improve her positive self talk Likert rating baseline date 04/12/21: How Easy - 3; How Often - 50% Target Date Goal Was reviewed Status Code Progress towards goal/Likert rating  04/12/22  New Maintained  Jill Alexander participated in development of tx plan and provided verbal consent.   [name] [facility]             [place], [facility]

## 2021-05-12 ENCOUNTER — Other Ambulatory Visit (HOSPITAL_COMMUNITY): Payer: Self-pay

## 2021-05-15 ENCOUNTER — Other Ambulatory Visit (HOSPITAL_COMMUNITY): Payer: Self-pay

## 2021-05-15 MED ORDER — ARIPIPRAZOLE 2 MG PO TABS
1.0000 mg | ORAL_TABLET | Freq: Every day | ORAL | 0 refills | Status: DC
  Filled 2021-05-15: qty 15, 30d supply, fill #0

## 2021-05-15 MED ORDER — FLUOXETINE HCL 10 MG PO CAPS
30.0000 mg | ORAL_CAPSULE | Freq: Every day | ORAL | 0 refills | Status: DC
  Filled 2021-05-15: qty 90, 30d supply, fill #0

## 2021-05-15 MED ORDER — NORTRIPTYLINE HCL 10 MG PO CAPS
20.0000 mg | ORAL_CAPSULE | Freq: Every day | ORAL | 0 refills | Status: DC
  Filled 2021-05-15: qty 60, 30d supply, fill #0

## 2021-05-15 MED ORDER — LAMOTRIGINE 200 MG PO TABS
200.0000 mg | ORAL_TABLET | Freq: Two times a day (BID) | ORAL | 0 refills | Status: DC
  Filled 2021-05-15 – 2021-06-01 (×2): qty 60, 30d supply, fill #0

## 2021-05-16 ENCOUNTER — Other Ambulatory Visit (HOSPITAL_COMMUNITY): Payer: Self-pay

## 2021-05-16 MED ORDER — LORAZEPAM 1 MG PO TABS
1.0000 mg | ORAL_TABLET | ORAL | 1 refills | Status: DC
  Filled 2021-05-16 – 2021-05-30 (×2): qty 60, 30d supply, fill #0

## 2021-05-17 ENCOUNTER — Ambulatory Visit (INDEPENDENT_AMBULATORY_CARE_PROVIDER_SITE_OTHER): Payer: No Typology Code available for payment source | Admitting: Psychology

## 2021-05-17 DIAGNOSIS — F840 Autistic disorder: Secondary | ICD-10-CM

## 2021-05-17 NOTE — Progress Notes (Signed)
[facility] Counselor/Therapist Progress Note  Patient ID: Jill Alexander, MRN: [id number],    Date: 05/17/2021  Time Spent: 1:30pm-2:27pm   Treatment Type: Individual Therapy  Pt was seen for a virtual video visit via webex.  Pt joins from her home and counselor from her office.   Reported Symptoms: anxiety Mental Status Exam: Appearance:  Well Groomed     Behavior: Appropriate  Motor: Normal  Speech/Language:  Normal Rate  Affect: Appropriate  Mood: anxious  Thought process: tangential  Thought content:   WNL  Sensory/Perceptual disturbances:   WNL  Orientation: oriented to person, place, time/date, and situation  Attention: Good  Concentration: Good  Memory: WNL  Fund of knowledge:  Good  Insight:   Good  Judgment:  Good  Impulse Control: Good   Risk Assessment: Danger to Self:  No Self-injurious Behavior: No Danger to Others: No Duty to Warn:no Physical Aggression / Violence:No  Access to Firearms a concern: No  Gang Involvement:No   Subjective: Counselor assessed pt current functioning per pt report. Processed w/pt coping w/ anxiety this past week.  Identified how connected w/ anticipation of internship placement.  Reflected less overall anxiety and pattern of anxiety w/ anticipation.  Assisted pt in recognizing distortion and reframing. Reflected positives w/ placement. Validated feelings re: others making assumptions based on stereotypes.  Pt affect wnl.  Pt reported that she has still experienced anxiety w/ anticipation re: placement.  Pt was able to recognize that has coped through that anxiety and didn't report panic attacks.  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  Pt discussed positives about this placement.  Pt was able to acknowledge faulty thinking re: to anticipation and how a pattern for her.  Pt reflected on feeling judge by others in past based on her  upbringing and opportunities she has had.  Pt discussed how has felt guilt about at times and able to reframe that she can focus on not making assumptions herself.   Interventions: Cognitive Behavioral Therapy and self compassion and positive self talk.  Diagnosis:Autism, current or active  Plan: Pt to f/u w/ this provider for weekly counseling during the lapse of tx w/ Dr. Dewayne Hatch until Feb 2023.  Pt to f/u w/ PCP and prescribing provider as scheduled.   Individualized Treatment Plan Strengths: Enjoys reading, Seeking counseling, In training program through [employer], "I like humor and jokes"  Supports: Her dog, her bestfriend since childhood, her parents, her sister   Goal/Needs for Treatment:  In order of importance to patient 1) Emotional Regulation/Anxiety 2) Improve Positive self talk    Client Statement of Needs: "Find ways of being nicer to myself", cope w/ anxiety and worry   Treatment Level:Outpatient Counseling  Symptoms:Anxiety, Perseverative worry, Negative self talk  Client Treatment Preferences:weekly counseling, return to Dr. Dewayne Hatch in Feb 2023 Prefers to be called Jill Alexander   Healthcare consumer's goal for treatment:  [name], [facility] will support the patient's ability to achieve the goals identified. Cognitive Behavioral Therapy, Supportive Counseling, Mindfulness and Relaxation Strategies and other evidenced-based practices will be used to promote progress towards healthy functioning.   Healthcare consumer will: Actively participate in therapy, working towards healthy functioning.    *Justification for Continuation/Discontinuation of Goal: R=Revised, O=Ongoing, A=Achieved, D=Discontinued  Goal 1) Jill Alexander will develop strategies to regulate her emotions and improve her positive self talk Likert rating baseline date 04/12/21: How Easy -  3; How Often - 50% Target Date Goal Was reviewed Status Code Progress towards goal/Likert rating  04/12/22  New Maintained              Jill Alexander participated in development of tx plan and provided verbal consent.   [name], [facility]             Derra Shartzer, [facility]               [place], [facility]

## 2021-05-18 ENCOUNTER — Other Ambulatory Visit (HOSPITAL_COMMUNITY): Payer: Self-pay

## 2021-05-21 ENCOUNTER — Ambulatory Visit: Payer: No Typology Code available for payment source | Admitting: Clinical

## 2021-05-24 ENCOUNTER — Ambulatory Visit (INDEPENDENT_AMBULATORY_CARE_PROVIDER_SITE_OTHER): Payer: No Typology Code available for payment source | Admitting: Psychology

## 2021-05-24 DIAGNOSIS — F840 Autistic disorder: Secondary | ICD-10-CM

## 2021-05-24 NOTE — Progress Notes (Signed)
[facility] Counselor/Therapist Progress Note  Patient ID: Jill Alexander, MRN: [id number],    Date: 05/24/2021  Time Spent: 2:33pm-3:31pm   Treatment Type: Individual Therapy  Pt was seen for a virtual video visit via webex.  Pt joins from her home and counselor from her office.   Reported Symptoms: depressed mood, increased withdrawn, low motivation, anxiety about eating Mental Status Exam: Appearance:  Well Groomed     Behavior: Appropriate  Motor: Normal  Speech/Language:  Normal Rate  Affect: Appropriate  Mood: depressed  Thought process: tangential  Thought content:   WNL  Sensory/Perceptual disturbances:   WNL  Orientation: oriented to person, place, time/date, and situation  Attention: Good  Concentration: Good  Memory: WNL  Fund of knowledge:  Good  Insight:   Good  Judgment:  Good  Impulse Control: Good   Risk Assessment: Danger to Self:  No Self-injurious Behavior: No Danger to Others: No Duty to Warn:no Physical Aggression / Violence:No  Access to Firearms a concern: No  Gang Involvement:No   Subjective: Counselor assessed pt current functioning per pt report. Processed w/pt increased depressive symptoms.  Explored w/pt depressive thinking and assisted reframing and focus on what she has engaged in this week, identifying things she does value and focus on self care.  Explored w/pt anxiety w/ eating related to struggles to make decisions about this week.  Assisted pt w/ having a A or B option- so not overwhelming.   Pt affect congruent w/ report of increased feeling down.  Pt reported that she has recognized feeling more down, increased difficulty w/ motivation for responsibilities, "I don't care thinking" and some withdrawn.  Pt was able to acknowledge w/ counselor assistance that she has been engaging in things this week- training program, interaction w/ dad/pets, reading/bookstore.  Pt identified what she can engage with to assist  coping through depressive mood and focus on her self care.  Pt identified that decisions around food have been overwhelming and helps if has a plan.  Interventions: Cognitive Behavioral Therapy and supportive, self care plan  Diagnosis:Autism, current or active  Plan: Pt to f/u w/ Dr. Dewayne Hatch her primary therapist as scheduled in Feb 2023. Pt to f/u w/ PCP and prescribing provider as scheduled.   Individualized Treatment Plan Strengths: Enjoys reading, Seeking counseling, In training program through [employer], "I like humor and jokes"  Supports: Her dog, her bestfriend since childhood, her parents, her sister   Goal/Needs for Treatment:  In order of importance to patient 1) Emotional Regulation/Anxiety 2) Improve Positive self talk    Client Statement of Needs: "Find ways of being nicer to myself", cope w/ anxiety and worry   Treatment Level:Outpatient Counseling  Symptoms:Anxiety, Perseverative worry, Negative self talk  Client Treatment Preferences:weekly counseling, return to Dr. Dewayne Hatch in Feb 2023 Prefers to be called Jill Alexander   Healthcare consumer's goal for treatment:  [name], [facility] will support the patient's ability to achieve the goals identified. Cognitive Behavioral Therapy, Supportive Counseling, Mindfulness and Relaxation Strategies and other evidenced-based practices will be used to promote progress towards healthy functioning.   Healthcare consumer will: Actively participate in therapy, working towards healthy functioning.    *Justification for Continuation/Discontinuation of Goal: R=Revised, O=Ongoing, A=Achieved, D=Discontinued  Goal 1) Jill Alexander will develop strategies to regulate her emotions and improve her positive self talk Likert rating baseline date 04/12/21: How Easy - 3; How Often - 50% Target Date Goal Was reviewed Status Code Progress towards goal/Likert rating  04/12/22  New Maintained             Jill Alexander participated in development of tx plan and  provided verbal consent.   [name], L[facility]             Nattie Lazenby, [facility]               Natika Geyer, [facility]               Quanetta Truss, [facility]

## 2021-05-28 ENCOUNTER — Other Ambulatory Visit (HOSPITAL_COMMUNITY): Payer: Self-pay

## 2021-05-29 ENCOUNTER — Other Ambulatory Visit (HOSPITAL_COMMUNITY): Payer: Self-pay

## 2021-05-29 ENCOUNTER — Ambulatory Visit: Payer: No Typology Code available for payment source | Admitting: Neurology

## 2021-05-29 NOTE — Progress Notes (Deleted)
PATIENT: Jill Alexander DOB: December 28, 1995  REASON FOR VISIT: Follow up HISTORY FROM: Patient PRIMARY NEUROLOGIST:   HISTORY OF PRESENT ILLNESS: Today 05/29/21 Jill Alexander   HISTORY 01/16/2021 Dr. Anne Hahn: Ms. Jill Alexander is a 26 year old left-handed white female with a history of intractable migraine headaches.  The patient has mild autism and a history of seizures.  She is on Topamax and lamotrigine currently.  She has not had any recent seizure events.  Her headaches over the last several weeks have become more frequent, she is having almost daily headaches.  She denies that she is under any increased stress.  She oftentimes will wake up with a headache.  She indicates that she drinks 2 to 3 cups of coffee daily.  She will take Maxalt if needed.  She denies any nausea or vomiting with the headache but does have some photophobia and phonophobia.  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  She thinks that she sleeps well but she does have ongoing problems with fatigue during the day, this is a chronic issue for her.  She returns to the office today for an evaluation.  She also remains on low-dose nortriptyline.   REVIEW OF SYSTEMS: Out of a complete 14 system review of symptoms, the patient complains only of the following symptoms, and all other reviewed systems are negative.  ALLERGIES: Allergies  Allergen Reactions   Gluten Meal     HOME MEDICATIONS: Outpatient Medications Prior to Visit  Medication Sig Dispense Refill   acetaminophen (TYLENOL) 500 MG tablet Take 500 mg by mouth every 6 (six) hours as needed.     alclomethasone (ACLOVATE) 0.05 % cream Apply to affected areas on face up to 2 times daily as needed for eczema 60 g 3   ARIPiprazole (ABILIFY) 2 MG tablet Take 0.5 tablets (1 mg total) by mouth at bedtime. 15 tablet 0   betamethasone dipropionate 0.05 % cream Apply 1 application topically 2 (two) times daily to affected areas on hands as  needed for eczema(not to face,groin or underarms) 45 g 3   desonide (DESOWEN) 0.05 % cream Apply 1 application topically to affected areas of face up to 2 times daily as needed for eczema 60 g 3   dicyclomine (BENTYL) 10 MG capsule Take 1 capsule (10 mg total) by mouth 4 (four) times daily before meals and at bedtime. (Patient taking differently: Take 10 mg by mouth in the morning and at bedtime.) 120 capsule 1   Docusate Sodium (COLACE PO) Take by mouth.     Famotidine (PEPCID PO) Take by mouth.     FLUoxetine (PROZAC) 10 MG capsule Take 3 capsules (30 mg total) by mouth daily. 90 capsule 1   FLUoxetine (PROZAC) 10 MG capsule Take 3 capsule(s) by mouth at bedtime for anxiety 90 capsule 0   FLUoxetine (PROZAC) 10 MG capsule Take 3 capsules (30 mg total) by mouth at bedtime for anxiety 90 capsule 0   Ibuprofen (MOTRIN PO) Take 200 mg by mouth as needed.     lamoTRIgine (LAMICTAL) 200 MG tablet Take 1 tablet (200 mg total) by mouth 2 (two) times daily for seizures and depression 60 tablet 0   LORazepam (ATIVAN) 1 MG tablet Take 1 tablet (1 mg total) by mouth 2 (two) times daily as needed. 60 tablet 1   LORazepam (ATIVAN) 1 MG tablet Take 1 tablet (1 mg total) by mouth 2 (two) times daily as needed for anxiety 60 tablet 0  LORazepam (ATIVAN) 1 MG tablet Take 1 tablet (1 mg total) by mouth 2 (two) times daily as needed for anxiety 60 tablet 1   LORazepam (ATIVAN) 1 MG tablet Take 1 tablet by mouth twice a day as needed for anxiety 60 tablet 1   melatonin 1 MG TABS tablet Take 1 tablet (1 mg total) by mouth at bedtime as needed (insomnia). 30 tablet 0   Multiple Vitamins-Minerals (MULTIVITAMIN PO) Take 1 tablet by mouth daily.     nortriptyline (PAMELOR) 10 MG capsule Take 3 capsules (30 mg total) by mouth at bedtime for sleep and headaches 90 capsule 0   nortriptyline (PAMELOR) 10 MG capsule Take 3 capsules (30 mg total) by mouth at bedtime for sleep and headaches 90 capsule 1   nortriptyline  (PAMELOR) 10 MG capsule Take 2 capsules (20 mg total) by mouth at bedtime for sleep and headaches 60 capsule 0   omeprazole (PRILOSEC) 20 MG capsule Take 1 capsule (20 mg total) by mouth daily. 30 capsule 5   polyethylene glycol (MIRALAX) packet Take 17 g by mouth daily as needed. 14 each 0   rizatriptan (MAXALT) 10 MG tablet TAKE 1 TABLET (10 MG TOTAL) BY MOUTH AS NEEDED FOR MIGRAINE. MAY REPEAT IN 2 HOURS IF NEEDED 10 tablet 11   Norethin Ace-Eth Estrad-FE (TAYTULLA) 1-20 MG-MCG(24) CAPS TAKE 1 CAPSULE BY MOUTH DAILY. 28 capsule 11   topiramate (TOPAMAX) 50 MG tablet Take 3 tablets (150 mg total) by mouth at bedtime. 270 tablet 1   No facility-administered medications prior to visit.    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    PAST SURGICAL HISTORY: Past Surgical History:  Procedure Laterality Date   none      FAMILY HISTORY: Family History  Problem Relation Age of Onset   Depression Father    Anxiety disorder Sister    Bipolar disorder Sister    Diabetes Maternal Grandfather    Cancer Maternal Grandfather    Breast cancer Paternal Grandmother    Uterine cancer Paternal Grandmother    Irritable bowel syndrome Paternal Grandmother    Colon cancer Neg Hx    Esophageal cancer Neg Hx    Stomach cancer Neg Hx    Rectal cancer Neg Hx     SOCIAL HISTORY: Social History   Socioeconomic History   Marital status: Single    Spouse name: Not on file   Number of children: 0   Years of education: Not on file   Highest education level: Some college, no degree  Occupational History   Not on file  Tobacco Use   Smoking status: Never   Smokeless tobacco: Never  Vaping Use   Vaping Use: Never used  Substance and  Sexual Activity   Alcohol use: No   Drug use: No   Sexual activity: Not Currently    Birth control/protection: Pill  Other Topics Concern   Not on file  Social History Narrative   Lives with parents in [place]. Pt moved here from [place] in 2018.   Caffeine use: 2-3 cups per day   Left handed    Social Determinants of Health   Financial Resource Strain:  Not on file  Food Insecurity: Not on file  Transportation Needs: Not on file  Physical Activity: Not on file  Stress: Not on file  Social Connections: Not on file  Intimate Partner Violence: Not on file      PHYSICAL EXAM  There were no vitals filed for this visit. There is no height or weight on file to calculate BMI.  Generalized: Well developed, in no acute distress   Neurological examination  Mentation: Alert oriented to time, place, history taking. Follows all commands speech and language fluent Cranial nerve II-XII: Pupils were equal round reactive to light. Extraocular movements were full, visual field were full on confrontational test. Facial sensation and strength were normal. Uvula tongue midline. Head turning and shoulder shrug  were normal and symmetric. Motor: The motor testing reveals 5 over 5 strength of all 4 extremities. Good symmetric motor tone is noted throughout.  Sensory: Sensory testing is intact to soft touch on all 4 extremities. No evidence of extinction is noted.  Coordination: Cerebellar testing reveals good finger-nose-finger and heel-to-shin bilaterally.  Gait and station: Gait is normal. Tandem gait is normal. Romberg is negative. No drift is seen.  Reflexes: Deep tendon reflexes are symmetric and normal bilaterally.   DIAGNOSTIC DATA (LABS, IMAGING, TESTING) - I reviewed patient records, labs, notes, testing and imaging myself where available.  Lab Results  Component Value Date   WBC 4.6 01/26/2021   HGB 14.3 01/26/2021   HCT 43.3 01/26/2021   MCV 93 01/26/2021   PLT 303 01/26/2021       Component Value Date/Time   NA 139 01/26/2021 1302   K 4.4 01/26/2021 1302   CL 106 01/26/2021 1302   CO2 17 (L) 01/26/2021 1302   GLUCOSE 94 01/26/2021 1302   GLUCOSE 83 07/01/2018 1537   BUN 12 01/26/2021 1302   CREATININE 0.84 01/26/2021 1302   CALCIUM 9.5 01/26/2021 1302   PROT 6.7 01/26/2021 1302   ALBUMIN 4.8 01/26/2021 1302   AST 18 01/26/2021 1302   ALT 14 01/26/2021 1302   ALKPHOS 73 01/26/2021 1302   BILITOT 0.2 01/26/2021 1302   GFRNONAA 122 05/13/2019 1015   GFRAA 141 05/13/2019 1015   No results found for: CHOL, HDL, LDLCALC, LDLDIRECT, TRIG, CHOLHDL Lab Results  Component Value Date   HGBA1C 5.2 07/14/2019   Lab Results  Component Value Date   VITAMINB12 491 01/26/2021   Lab Results  Component Value Date   TSH 1.110 01/26/2021      ASSESSMENT AND PLAN 26 y.o. year old female  has a past medical history of Anxiety, Autism, Chronic nausea, Common migraine with intractable migraine (07/01/2019), Depression, Dyspepsia, GERD (gastroesophageal reflux disease), Headache, Heart murmur, Seizures (HCC), and Tremor, essential (04/09/2017). here with ***   I spent 15 minutes with the patient. 50% of this time was spent   Margie EgeSarah Evalynn Hankins, [place]ove[place]NP-C, [place] 05/29/2021, 5:33 AM [facility] [address], [address] [place], [place] [address] [phone]

## 2021-05-30 ENCOUNTER — Other Ambulatory Visit (HOSPITAL_COMMUNITY): Payer: Self-pay

## 2021-06-01 ENCOUNTER — Other Ambulatory Visit (HOSPITAL_COMMUNITY): Payer: Self-pay

## 2021-06-04 ENCOUNTER — Other Ambulatory Visit (HOSPITAL_COMMUNITY): Payer: Self-pay

## 2021-06-05 ENCOUNTER — Ambulatory Visit (INDEPENDENT_AMBULATORY_CARE_PROVIDER_SITE_OTHER): Payer: No Typology Code available for payment source | Admitting: Clinical

## 2021-06-05 DIAGNOSIS — F840 Autistic disorder: Secondary | ICD-10-CM

## 2021-06-05 NOTE — Progress Notes (Signed)
Date: 06/05/2021 Time: 10:10am-10:54am CPT Code: 23762G-31 Diagnosis Code: F84.0  Jill Alexander was seen remotely using secure video conferencing. She and the therapist were in their respective homes at the time of the appointment. 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 Therapist suggested making a plan for each day of the week (for example, Taco Tuesdays), and opting for pre-prepared meals as much as possible to minimize decision making. She indicated a plan to try this, and is scheduled to be seen again in one week.       [facility] Counselor/Therapist Progress Note  Patient ID: Jill Alexander, MRN: [id number],     Individualized Treatment Plan Strengths: Enjoys reading, Seeking counseling, In training program through [employer], "I like humor and jokes"  Supports: Her dog, her bestfriend since childhood, her parents, her sister   Goal/Needs for Treatment:  In order of importance to patient 1) Emotional Regulation/Anxiety 2) Improve Positive self talk    Client Statement of Needs: "Find ways of being nicer to myself", cope w/ anxiety and worry   Treatment Level:Outpatient Counseling  Symptoms:Anxiety, Perseverative worry, Negative self talk  Client Treatment Preferences:weekly counseling, return to Dr. Dewayne Hatch in Feb 2023 Prefers to be called Jill Alexander   Healthcare consumer's goal for treatment:  [name], [facility] will support the patient's ability to achieve the goals identified. Cognitive Behavioral Therapy, Supportive Counseling, Mindfulness and Relaxation Strategies and other evidenced-based practices will be used to promote progress towards healthy functioning.   Healthcare consumer will: Actively participate in therapy, working towards healthy functioning.    *Justification for Continuation/Discontinuation of Goal: R=Revised, O=Ongoing, A=Achieved, D=Discontinued  Goal  1) Jill Alexander will develop strategies to regulate her emotions and improve her positive self talk Likert rating baseline date 04/12/21: How Easy - 3; How Often - 50% Target Date Goal Was reviewed Status Code Progress towards goal/Likert rating  04/12/22  New Maintained             Jill Alexander participated in development of tx plan and provided verbal consent.   Chrissie Noa, PhD             Chrissie Noa, PhD               Chrissie Noa, PhD               Chrissie Noa, PhD

## 2021-06-06 ENCOUNTER — Other Ambulatory Visit (HOSPITAL_COMMUNITY): Payer: Self-pay

## 2021-06-11 ENCOUNTER — Other Ambulatory Visit (HOSPITAL_COMMUNITY): Payer: Self-pay

## 2021-06-12 ENCOUNTER — Other Ambulatory Visit (HOSPITAL_COMMUNITY): Payer: Self-pay

## 2021-06-12 ENCOUNTER — Ambulatory Visit (INDEPENDENT_AMBULATORY_CARE_PROVIDER_SITE_OTHER): Payer: No Typology Code available for payment source | Admitting: Clinical

## 2021-06-12 DIAGNOSIS — F840 Autistic disorder: Secondary | ICD-10-CM | POA: Diagnosis not present

## 2021-06-12 NOTE — Progress Notes (Signed)
Date: 06/05/2021 Time: 10:10am-10:54am CPT Code: 56387F-64 Diagnosis Code: F84.0  Jill Alexander was seen remotely using secure video conferencing. She was in her home and the therapist was in her office at the time of the appointment. She shared ongoing self-criticism, especially with regard to social interactions with her family. Therapist likened her tendency to think through social interactions carefully to an internal GPS system she has developed as an adaptation to having ASD, working with her to consider times when it is helpful and times when it is not. For homework, she will practice noticing and "turning the volume down" when her inner GPS is not giving helpful directions. She is scheduled to be seen again in one week.    [facility] Counselor/Therapist Progress Note  Patient ID: Jill Alexander, MRN: [id number],     Individualized Treatment Plan Strengths: Enjoys reading, Seeking counseling, In training program through [employer], "I like humor and jokes"  Supports: Her dog, her bestfriend since childhood, her parents, her sister   Goal/Needs for Treatment:  In order of importance to patient 1) Emotional Regulation/Anxiety 2) Improve Positive self talk    Client Statement of Needs: "Find ways of being nicer to myself", cope w/ anxiety and worry   Treatment Level:Outpatient Counseling  Symptoms:Anxiety, Perseverative worry, Negative self talk  Client Treatment Preferences:weekly counseling, return to Dr. Dewayne Hatch in Feb 2023 Prefers to be called Jill Alexander   Healthcare consumer's goal for treatment:  [name], [facility] will support the patient's ability to achieve the goals identified. Cognitive Behavioral Therapy, Supportive Counseling, Mindfulness and Relaxation Strategies and other evidenced-based practices will be used to promote progress towards healthy functioning.   Healthcare consumer will: Actively participate in therapy, working towards healthy functioning.     *Justification for Continuation/Discontinuation of Goal: R=Revised, O=Ongoing, A=Achieved, D=Discontinued  Goal 1) Jill Alexander will develop strategies to regulate her emotions and improve her positive self talk Likert rating baseline date 04/12/21: How Easy - 3; How Often - 50% Target Date Goal Was reviewed Status Code Progress towards goal/Likert rating  04/12/22  New Maintained             Jill Alexander participated in development of tx plan and provided verbal consent.   Chrissie Noa, PhD             Chrissie Noa, PhD               Chrissie Noa, PhD               Chrissie Noa, PhD               Chrissie Noa, PhD

## 2021-06-14 ENCOUNTER — Other Ambulatory Visit (HOSPITAL_COMMUNITY): Payer: Self-pay

## 2021-06-14 MED ORDER — FLUOXETINE HCL 10 MG PO CAPS
30.0000 mg | ORAL_CAPSULE | Freq: Every day | ORAL | 1 refills | Status: DC
  Filled 2021-06-14: qty 90, 30d supply, fill #0
  Filled 2021-08-02: qty 90, 30d supply, fill #1

## 2021-06-14 MED ORDER — LAMOTRIGINE 200 MG PO TABS
200.0000 mg | ORAL_TABLET | Freq: Two times a day (BID) | ORAL | 1 refills | Status: DC
  Filled 2021-06-14 – 2021-07-03 (×2): qty 60, 30d supply, fill #0
  Filled 2021-08-05: qty 60, 30d supply, fill #1

## 2021-06-14 MED ORDER — LORAZEPAM 1 MG PO TABS
1.0000 mg | ORAL_TABLET | Freq: Two times a day (BID) | ORAL | 1 refills | Status: DC | PRN
  Filled 2021-06-14 – 2021-07-03 (×2): qty 60, 30d supply, fill #0
  Filled 2021-07-25 – 2021-08-01 (×3): qty 60, 30d supply, fill #1

## 2021-06-14 MED ORDER — ARIPIPRAZOLE 2 MG PO TABS
1.0000 mg | ORAL_TABLET | Freq: Every day | ORAL | 1 refills | Status: DC
  Filled 2021-06-14: qty 15, 30d supply, fill #0
  Filled 2021-07-03: qty 15, 30d supply, fill #1

## 2021-06-18 ENCOUNTER — Telehealth: Payer: No Typology Code available for payment source | Admitting: Family

## 2021-06-18 ENCOUNTER — Other Ambulatory Visit (HOSPITAL_COMMUNITY): Payer: Self-pay

## 2021-06-18 DIAGNOSIS — R399 Unspecified symptoms and signs involving the genitourinary system: Secondary | ICD-10-CM

## 2021-06-18 MED ORDER — CEPHALEXIN 500 MG PO CAPS
500.0000 mg | ORAL_CAPSULE | Freq: Two times a day (BID) | ORAL | 0 refills | Status: DC
  Filled 2021-06-18: qty 14, 7d supply, fill #0

## 2021-06-18 NOTE — Progress Notes (Signed)

## 2021-06-19 ENCOUNTER — Ambulatory Visit (INDEPENDENT_AMBULATORY_CARE_PROVIDER_SITE_OTHER): Payer: No Typology Code available for payment source | Admitting: Clinical

## 2021-06-19 DIAGNOSIS — F840 Autistic disorder: Secondary | ICD-10-CM

## 2021-06-19 NOTE — Progress Notes (Signed)
Date: 06/05/2021 Time: 11:10am-11:55am CPT Code: 72536U-44 Diagnosis Code: F84.0  Jill Alexander was seen remotely using secure video conferencing. She was in her home and the therapist was in her office at the time of the appointment. She shared that she has been experiencing imposter syndrome that takes the form of feeling she does not deserve to be where she is in life because she did not work hard enough. Therapist reframed these thoughts as a shift in previous depressive/anxious thought patterns she has had, ironically in response to her recent achievements. This appeared to shift her mood, and she found this amusing and readily engaged in a deep breathing exercise. She is scheduled to be seen again in one week.      [facility] Counselor/Therapist Progress Note  Patient ID: Jill Alexander, MRN: [id number],     Individualized Treatment Plan Strengths: Enjoys reading, Seeking counseling, In training program through [employer], "I like humor and jokes"  Supports: Her dog, her bestfriend since childhood, her parents, her sister   Goal/Needs for Treatment:  In order of importance to patient 1) Emotional Regulation/Anxiety 2) Improve Positive self talk    Client Statement of Needs: "Find ways of being nicer to myself", cope w/ anxiety and worry   Treatment Level:Outpatient Counseling  Symptoms:Anxiety, Perseverative worry, Negative self talk  Client Treatment Preferences:weekly counseling, return to Dr. Dewayne Hatch in Feb 2023 Prefers to be called Jill Alexander   Healthcare consumer's goal for treatment:  [name], [facility] will support the patient's ability to achieve the goals identified. Cognitive Behavioral Therapy, Supportive Counseling, Mindfulness and Relaxation Strategies and other evidenced-based practices will be used to promote progress towards healthy functioning.   Healthcare consumer will: Actively participate in therapy, working towards healthy functioning.     *Justification for Continuation/Discontinuation of Goal: R=Revised, O=Ongoing, A=Achieved, D=Discontinued  Goal 1) Jill Alexander will develop strategies to regulate her emotions and improve her positive self talk Likert rating baseline date 04/12/21: How Easy - 3; How Often - 50% Target Date Goal Was reviewed Status Code Progress towards goal/Likert rating  04/12/22  New Maintained             Jill Alexander participated in development of tx plan and provided verbal consent.   Chrissie Noa, PhD             Chrissie Noa, PhD               Chrissie Noa, PhD               Chrissie Noa, PhD               Chrissie Noa, PhD               Chrissie Noa, PhD

## 2021-06-20 ENCOUNTER — Other Ambulatory Visit (HOSPITAL_COMMUNITY): Payer: Self-pay

## 2021-06-20 NOTE — Progress Notes (Signed)
PATIENT: Jill Alexander DOB: January 29, 1996  REASON FOR VISIT: Follow up for headaches, seizures HISTORY FROM: Patient, alone PRIMARY NEUROLOGIST: Dr. Delena Bali   HISTORY OF PRESENT ILLNESS: Today 06/21/21 [name] has history of migraine headaches and seizures.  Topamax was increased at last visit, it helped for awhile. Still reporting headache. She has autism. No recent seizures. Now complains of daily headache in last several weeks. Stress level isn't very high. Wakes up with headaches. Will take ibuprofen or Tylenol, is careful not to overtake, only takes 2 days a week. 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 Has been drinking more coffee.   HISTORY  01/16/2021 Dr. Anne Hahn: Jill Alexander is a 26 year old left-handed white female with a history of intractable migraine headaches.  The patient has mild autism and a history of seizures.  She is on Topamax and lamotrigine currently.  She has not had any recent seizure events.  Her headaches over the last several weeks have become more frequent, she is having almost daily headaches.  She denies that she is under any increased stress.  She oftentimes will wake up with a headache.  She indicates that she drinks 2 to 3 cups of coffee daily.  She will take Maxalt if needed.  She denies any nausea or vomiting with the headache but does have some photophobia and phonophobia.  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  She thinks that she sleeps well but she does have ongoing problems with fatigue during the day, this is a chronic issue for her.  She returns to the office today for an evaluation.  She also remains on low-dose nortriptyline.  REVIEW OF SYSTEMS: Out of a complete 14 system review of symptoms, the patient complains only of the following symptoms, and all other reviewed systems are negative.  See HPI  ALLERGIES: Allergies  Allergen Reactions   Gluten Meal     HOME  MEDICATIONS: Outpatient Medications Prior to Visit  Medication Sig Dispense Refill   acetaminophen (TYLENOL) 500 MG tablet Take 500 mg by mouth every 6 (six) hours as needed.     alclomethasone (ACLOVATE) 0.05 % cream Apply to affected areas on face up to 2 times daily as needed for eczema 60 g 3   ARIPiprazole (ABILIFY) 2 MG tablet Take 0.5 tablets (1 mg total) by mouth at bedtime. 15 tablet 1   betamethasone dipropionate 0.05 % cream Apply 1 application topically 2 (two) times daily to affected areas on hands as needed for eczema(not to face,groin or underarms) 45 g 3   cephALEXin (KEFLEX) 500 MG capsule Take 1 capsule (500 mg total) by mouth 2 (two) times daily. 14 capsule 0   desonide (DESOWEN) 0.05 % cream Apply 1 application topically to affected areas of face up to 2 times daily as needed for eczema 60 g 3   dicyclomine (BENTYL) 10 MG capsule Take 1 capsule (10 mg total) by mouth 4 (four) times daily before meals and at bedtime. (Patient taking differently: Take 10 mg by mouth in the morning and at bedtime.) 120 capsule 1   Docusate Sodium (COLACE PO) Take by mouth.     Famotidine (PEPCID PO) Take by mouth.     FLUoxetine (PROZAC) 10 MG capsule Take 3 capsules (30 mg total) by mouth daily. 90 capsule 1   Ibuprofen (MOTRIN PO) Take 200 mg by mouth as needed.     lamoTRIgine (LAMICTAL) 200 MG tablet Take  1 tablet (200 mg total) by mouth 2 (two) times daily for seizures and depression 60 tablet 1   LORazepam (ATIVAN) 1 MG tablet Take 1 tablet (1 mg total) by mouth 2 (two) times daily as needed. 60 tablet 1   melatonin 1 MG TABS tablet Take 1 tablet (1 mg total) by mouth at bedtime as needed (insomnia). 30 tablet 0   Multiple Vitamins-Minerals (MULTIVITAMIN PO) Take 1 tablet by mouth daily.     Norethin Ace-Eth Estrad-FE (TAYTULLA) 1-20 MG-MCG(24) CAPS TAKE 1 CAPSULE BY MOUTH DAILY. 28 capsule 11   nortriptyline (PAMELOR) 10 MG capsule Take 3 capsules (30 mg total) by mouth at bedtime for sleep  and headaches 90 capsule 1   omeprazole (PRILOSEC) 20 MG capsule Take 1 capsule (20 mg total) by mouth daily. 30 capsule 5   polyethylene glycol (MIRALAX) packet Take 17 g by mouth daily as needed. 14 each 0   rizatriptan (MAXALT) 10 MG tablet TAKE 1 TABLET (10 MG TOTAL) BY MOUTH AS NEEDED FOR MIGRAINE. MAY REPEAT IN 2 HOURS IF NEEDED 10 tablet 11   topiramate (TOPAMAX) 50 MG tablet Take 3 tablets (150 mg total) by mouth at bedtime. 270 tablet 1   FLUoxetine (PROZAC) 10 MG capsule Take 3 capsule(s) by mouth at bedtime for anxiety 90 capsule 0   FLUoxetine (PROZAC) 10 MG capsule Take 3 capsules (30 mg total) by mouth at bedtime for anxiety 90 capsule 0   FLUoxetine (PROZAC) 10 MG capsule Take 3 capsules (30 mg total) by mouth at bedtime for anxiety 90 capsule 1   LORazepam (ATIVAN) 1 MG tablet Take 1 tablet (1 mg total) by mouth 2 (two) times daily as needed for anxiety 60 tablet 0   LORazepam (ATIVAN) 1 MG tablet Take 1 tablet (1 mg total) by mouth 2 (two) times daily as needed for anxiety 60 tablet 1   LORazepam (ATIVAN) 1 MG tablet Take 1 tablet by mouth twice a day as needed for anxiety 60 tablet 1   LORazepam (ATIVAN) 1 MG tablet Take 1 tablet (1 mg total) by mouth 2 (two) times daily as needed for anxiety 60 tablet 1   nortriptyline (PAMELOR) 10 MG capsule Take 3 capsules (30 mg total) by mouth at bedtime for sleep and headaches 90 capsule 0   nortriptyline (PAMELOR) 10 MG capsule Take 2 capsules (20 mg total) by mouth at bedtime for sleep and headaches 60 capsule 0   No facility-administered medications prior to visit.    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    PAST SURGICAL HISTORY: Past Surgical History:  Procedure  Laterality Date   none      FAMILY HISTORY: Family History  Problem Relation Age of Onset   Depression Father    Anxiety disorder Sister    Bipolar disorder Sister    Diabetes Maternal Grandfather    Cancer Maternal Grandfather    Breast cancer Paternal Grandmother    Uterine cancer Paternal Grandmother    Irritable bowel syndrome Paternal Grandmother    Colon cancer Neg Hx    Esophageal cancer Neg Hx    Stomach cancer Neg Hx    Rectal  cancer Neg Hx     SOCIAL HISTORY: Social History   Socioeconomic History   Marital status: Single    Spouse name: Not on file   Number of children: 0   Years of education: Not on file   Highest education level: Some college, no degree  Occupational History   Not on file  Tobacco Use   Smoking status: Never   Smokeless tobacco: Never  Vaping Use   Vaping Use: Never used  Substance and Sexual Activity   Alcohol use: No   Drug use: No   Sexual activity: Not Currently    Birth control/protection: Pill  Other Topics Concern   Not on file  Social History Narrative   Lives with parents in [place]. Pt moved here from [place] in 2018.   Caffeine use: 2-3 cups per day   Left handed    Social Determinants of Health   Financial Resource Strain: Not on file  Food Insecurity: Not on file  Transportation Needs: Not on file  Physical Activity: Not on file  Stress: Not on file  Social Connections: Not on file  Intimate Partner Violence: Not on file   PHYSICAL EXAM  Vitals:   06/21/21 1054  BP: 105/71  Pulse: 79  Weight: 140 lb (63.5 kg)  Height: 5\' 2"  (1.575 m)   Body mass index is 25.61 kg/m. Generalized: Well developed, in no acute distress  Neurological examination  Mentation: Alert oriented to time, place, history taking. Follows all commands, has delayed word finding Cranial nerve II-XII: Pupils were equal round reactive to light. Extraocular movements were full, visual field were full on confrontational test. Facial  sensation and strength were normal. Head turning and shoulder shrug  were normal and symmetric. Motor: The motor testing reveals 5 over 5 strength of all 4 extremities. Good symmetric motor tone is noted throughout.  Sensory: Sensory testing is intact to soft touch on all 4 extremities. No evidence of extinction is noted.  Coordination: Cerebellar testing reveals good finger-nose-finger and heel-to-shin bilaterally. Mildly tremulous.  Gait and station: Gait is normal.  Reflexes: Deep tendon reflexes are symmetric and normal bilaterally.   DIAGNOSTIC DATA (LABS, IMAGING, TESTING) - I reviewed patient records, labs, notes, testing and imaging myself where available.  Lab Results  Component Value Date   WBC 4.6 01/26/2021   HGB 14.3 01/26/2021   HCT 43.3 01/26/2021   MCV 93 01/26/2021   PLT 303 01/26/2021      Component Value Date/Time   NA 139 01/26/2021 1302   K 4.4 01/26/2021 1302   CL 106 01/26/2021 1302   CO2 17 (L) 01/26/2021 1302   GLUCOSE 94 01/26/2021 1302   GLUCOSE 83 07/01/2018 1537   BUN 12 01/26/2021 1302   CREATININE 0.84 01/26/2021 1302   CALCIUM 9.5 01/26/2021 1302   PROT 6.7 01/26/2021 1302   ALBUMIN 4.8 01/26/2021 1302   AST 18 01/26/2021 1302   ALT 14 01/26/2021 1302   ALKPHOS 73 01/26/2021 1302   BILITOT 0.2 01/26/2021 1302   GFRNONAA 122 05/13/2019 1015   GFRAA 141 05/13/2019 1015   No results found for: CHOL, HDL, LDLCALC, LDLDIRECT, TRIG, CHOLHDL Lab Results  Component Value Date   HGBA1C 5.2 07/14/2019   Lab Results  Component Value Date   VITAMINB12 491 01/26/2021   Lab Results  Component Value Date   TSH 1.110 01/26/2021      ASSESSMENT AND PLAN 26 y.o. year old female   Intractable migraine headache  History of seizures,  well controlled   -Add on Aimovig 140 mg once a month for migraine prevention -Continue Maxalt as needed for acute headache  -Continue Topamax 150 mg daily for headaches  -On Lamictal 200 mg twice daily for  seizures and depression -Also on Abilify, nortriptyline, Ativan, Prozac from psychiatry  -Return back in 4-6 months or sooner if needed    Otila KluverSarah Rozann Holts, AGNP-C, DNP 06/21/2021, 11:08 AM [facility] [address], [address] [place], [place] [address] [phone]

## 2021-06-21 ENCOUNTER — Ambulatory Visit (INDEPENDENT_AMBULATORY_CARE_PROVIDER_SITE_OTHER): Payer: No Typology Code available for payment source | Admitting: Neurology

## 2021-06-21 ENCOUNTER — Telehealth: Payer: Self-pay

## 2021-06-21 ENCOUNTER — Other Ambulatory Visit (HOSPITAL_COMMUNITY): Payer: Self-pay

## 2021-06-21 VITALS — BP 105/71 | HR 79 | Ht 62.0 in | Wt 140.0 lb

## 2021-06-21 DIAGNOSIS — G43019 Migraine without aura, intractable, without status migrainosus: Secondary | ICD-10-CM

## 2021-06-21 DIAGNOSIS — R569 Unspecified convulsions: Secondary | ICD-10-CM | POA: Diagnosis not present

## 2021-06-21 MED ORDER — TOPIRAMATE 50 MG PO TABS
150.0000 mg | ORAL_TABLET | Freq: Every day | ORAL | 1 refills | Status: DC
  Filled 2021-06-21 – 2021-07-19 (×2): qty 270, 90d supply, fill #0
  Filled 2021-10-09: qty 270, 90d supply, fill #1

## 2021-06-21 MED ORDER — RIZATRIPTAN BENZOATE 10 MG PO TABS
10.0000 mg | ORAL_TABLET | ORAL | 11 refills | Status: DC | PRN
  Filled 2021-06-21: qty 10, fill #0
  Filled 2021-07-05: qty 10, 30d supply, fill #0
  Filled 2021-09-17: qty 10, 30d supply, fill #1
  Filled 2021-10-21: qty 10, 30d supply, fill #2
  Filled 2021-12-10: qty 10, 30d supply, fill #3
  Filled 2021-12-27 – 2022-01-02 (×2): qty 10, 30d supply, fill #4
  Filled 2022-01-27: qty 10, 30d supply, fill #5
  Filled 2022-02-13: qty 10, 30d supply, fill #6

## 2021-06-21 MED ORDER — AIMOVIG 140 MG/ML ~~LOC~~ SOAJ
140.0000 mg | SUBCUTANEOUS | 11 refills | Status: DC
  Filled 2021-06-21: qty 1, 28d supply, fill #0
  Filled 2021-07-12: qty 1, 30d supply, fill #0

## 2021-06-21 NOTE — Telephone Encounter (Signed)
Completed PA for aimovig. Sent to [employer] via CMM. Should have determination within 3-5 business days. Key: [id number].

## 2021-06-21 NOTE — Patient Instructions (Signed)
Add on Aimovig for migraine prevention 140 mg monthly injection for headache prevention  Continue other medications Cut back on caffeine intake  Return back in 4 months

## 2021-06-25 ENCOUNTER — Other Ambulatory Visit (HOSPITAL_COMMUNITY): Payer: Self-pay

## 2021-06-26 ENCOUNTER — Other Ambulatory Visit (HOSPITAL_COMMUNITY): Payer: Self-pay

## 2021-06-26 ENCOUNTER — Ambulatory Visit (INDEPENDENT_AMBULATORY_CARE_PROVIDER_SITE_OTHER): Payer: No Typology Code available for payment source | Admitting: Clinical

## 2021-06-26 ENCOUNTER — Telehealth: Payer: Self-pay | Admitting: Neurology

## 2021-06-26 DIAGNOSIS — F840 Autistic disorder: Secondary | ICD-10-CM

## 2021-06-26 MED ORDER — NORTRIPTYLINE HCL 10 MG PO CAPS
20.0000 mg | ORAL_CAPSULE | Freq: Every evening | ORAL | 1 refills | Status: DC | PRN
  Filled 2021-06-26: qty 60, 30d supply, fill #0
  Filled 2021-07-25: qty 60, 30d supply, fill #1

## 2021-06-26 NOTE — Telephone Encounter (Signed)
At 10:05 this morning Jill Alexander @ [facility] [phone]) left a vm asking for a call back from [place], CMA.  On vm  Jill Alexander stated it was very important that she speaks with Candice, CMA on today before she leaves the office at 1:00.  This is a vm that was just listened to after 1pm.

## 2021-06-26 NOTE — Telephone Encounter (Signed)
Attempted to call pt, LVM for call back  °

## 2021-06-26 NOTE — Progress Notes (Addendum)
Date: 06/26/2021 Time: 11:01am-11:55am CPT Code: [id number]-04 Diagnosis Code: F84.0  Jill Alexander was seen remotely using secure video conferencing. She was in her home and the therapist was in her office at the time of the appointment. 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 Therapist encouraged her to reflect upon her growth and achievement in nearing the end of [employer], and remembering that if she is not hired, it may be a sign that the position is not actually a good fit for her. She had also reached out to her gynecologist to determine whether mood swings may be hormonal, as well as her psychiatrist to determine whether a tweak in medication could help. She is scheduled to be seen again in two weeks.     [facility] Counselor/Therapist Progress Note  Patient ID: [name], MRN: [id number],     Individualized Treatment Plan Strengths: Enjoys reading, Seeking counseling, In training program through [employer], "I like humor and jokes"  Supports: Her dog, her bestfriend since childhood, her parents, her sister   Goal/Needs for Treatment:  In order of importance to patient 1) Emotional Regulation/Anxiety 2) Improve Positive self talk    Client Statement of Needs: "Find ways of being nicer to myself", cope w/ anxiety and worry   Treatment Level:Outpatient Counseling  Symptoms:Anxiety, Perseverative worry, Negative self talk  Client Treatment Preferences:weekly counseling, return to Dr. Dewayne Hatch in Feb 2023 Prefers to be called Jill Alexander   Healthcare consumer's goal for treatment:  [name], [facility] will support the patient's ability to achieve the goals identified. Cognitive Behavioral Therapy, Supportive Counseling, Mindfulness and Relaxation Strategies and other evidenced-based practices will be used to promote progress towards healthy  functioning.   Healthcare consumer will: Actively participate in therapy, working towards healthy functioning.    *Justification for Continuation/Discontinuation of Goal: R=Revised, O=Ongoing, A=Achieved, D=Discontinued  Goal 1) Jill Alexander will develop strategies to regulate her emotions and improve her positive self talk Likert rating baseline date 04/12/21: How Easy - 3; How Often - 50% Target Date Goal Was reviewed Status Code Progress towards goal/Likert rating  04/12/22  New Maintained             Jill Alexander participated in development of tx plan and provided verbal consent.   Chrissie Noa, PhD             Chrissie Noa, PhD               Chrissie Noa, PhD               Chrissie Noa, PhD               Chrissie Noa, PhD               Chrissie Noa, PhD               Chrissie Noa, PhD

## 2021-06-26 NOTE — Telephone Encounter (Signed)
I called [facility] and was forced to leave a voicemail asking for a return call.

## 2021-06-27 NOTE — Telephone Encounter (Signed)
Neuro psych called to discuss pts med list, states the wrong medications were removed from pts chart. ? ?

## 2021-06-28 NOTE — Telephone Encounter (Signed)
PA approved from 06/27/21 through 12/24/21. ?PA [id number] ?

## 2021-07-02 ENCOUNTER — Ambulatory Visit: Payer: No Typology Code available for payment source | Attending: Urology | Admitting: Physical Therapy

## 2021-07-02 ENCOUNTER — Other Ambulatory Visit: Payer: Self-pay

## 2021-07-02 ENCOUNTER — Encounter: Payer: Self-pay | Admitting: Physical Therapy

## 2021-07-02 DIAGNOSIS — M6281 Muscle weakness (generalized): Secondary | ICD-10-CM | POA: Insufficient documentation

## 2021-07-02 DIAGNOSIS — R293 Abnormal posture: Secondary | ICD-10-CM | POA: Insufficient documentation

## 2021-07-02 DIAGNOSIS — R279 Unspecified lack of coordination: Secondary | ICD-10-CM | POA: Diagnosis present

## 2021-07-02 NOTE — Therapy (Signed)
OUTPATIENT PHYSICAL THERAPY FEMALE PELVIC EVALUATION   Patient Name: Jill Alexander MRN: [id number] DOB:1995-11-21, 26 y.o., female Today's Date: 07/02/2021   PT End of Session - 07/02/21 1506     Visit Number 1    Date for PT Re-Evaluation 10/02/21    Authorization Type [employer] plan    PT Start Time 1503   pt arrival time   PT Stop Time 1530    PT Time Calculation (min) 27 min    Activity Tolerance Patient tolerated treatment well    Behavior During Therapy [facility] for tasks assessed/performed             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   Past Surgical History:  Procedure Laterality Date   none     Patient Active Problem List   Diagnosis Date Noted   Common migraine with intractable migraine 07/01/2019   Intermittent headache 10/26/2018   Epigastric pain 10/26/2018   Gastroesophageal reflux disease 10/26/2018   Bipolar 2 disorder (HCC) 06/02/2018   Panic attacks 06/02/2018   Autism spectrum disorder 05/06/2018   Anxiety and depression 05/06/2018   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   Seizures (HCC) 04/09/2017   Tremor, 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    PCP: Jake Shark, PA-C  REFERRING PROVIDER: Noel Christmas, MD  REFERRING DIAG: R35.0 (ICD-10-CM) - Frequency of micturition R39.15 (ICD-10-CM) - Urgency of urination  THERAPY DIAG:  Muscle weakness (generalized)  Abnormal posture  Unspecified lack of coordination  ONSET DATE: at least a couple years  SUBJECTIVE:                                                                                                                                                                                           SUBJECTIVE STATEMENT: Pt  reports urinary incontinence sometimes with walking but usually with urgency and can't get there quickly enough. Pt reports she feels the only happens a couple times per week at most. Pt does report needing to urinate about every 1 hour to 1.5 hours during the day and needs to urinate 1-2x per night.   Fluid intake: Yes: ~40oz of water per day    Patient confirms identification and approves PT to assess pelvic floor and treatment Yes  PERTINENT HISTORY:  intractable migraine headaches, mild autism, and a  history of seizures Sexual abuse: No  PAIN:  Are you having pain? No    BOWEL MOVEMENT Pain with bowel movement: No Type of bowel movement:Type (Bristol Stool Scale) 4, Frequency every day to every other day, and Strain Yes Fully empty rectum: Yes:   Leakage: No Pads: No Fiber supplement: No  URINATION Pain with urination: No Fully empty bladder: No Stream: Strong Urgency: Yes:   Frequency: about every 1-1.5 hours during the day and does get up 1-2x per night Leakage: Urge to void, Walking to the bathroom, and walking with a full bladder Pads: Yes: 1x  sometimes not every day  INTERCOURSE Pain with intercourse:  not sexually active  PREGNANCY Never been pregnant  PROLAPSE None  PRECAUTIONS: None  WEIGHT BEARING RESTRICTIONS No  FALLS:  Has patient fallen in last 6 months? 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  PLOF: Independent  PATIENT GOALS to have less leakage   OBJECTIVE:    PATIENT SURVEYS:   [id number]  COGNITION:  Overall cognitive status:  pt has mild autism and benefits from extra time to answer all questions and progress information      SENSATION:  Light touch: Appears intact  Proprioception: Appears intact  MUSCLE LENGTH: Hamstrings and adductors bil limited by 25%  POSTURE:  Rounded  shoulders, posterior pelvic tilt  PALPATION: Internal Pelvic Floor deferred at this time  External Perineal Exam pelvic floor deferred   GENERAL deferred  LUMBARAROM/PROM  Lumbar WFL  LE AROM/PROM:  WFL bil LE  LE MMT:  Bil hips 3+/5 hip flexion and abduction and 4/5 adduction and hamstrings  PELVIC MMT: deferred at this time   MMT  07/02/2021  Vaginal   Internal Anal Sphincter   External Anal Sphincter   Puborectalis   Diastasis Recti   (Blank rows = not tested)   TONE: deferred at this time  PROLAPSE: deferred at this time       TODAY'S TREATMENT  EVAL no treatment completed this date due to time as pt arrived late to appointment   PATIENT EDUCATION:  Education details: urge drill  Person educated: Patient Education method: Explanation, Demonstration, Tactile cues, Verbal cues, and Handouts Education comprehension: verbalized understanding and returned demonstration   HOME EXERCISE PROGRAM: Urge drill given and discussed during session pt denied additional questions  ASSESSMENT:  CLINICAL IMPRESSION: Patient is a 26 y.o. female  who was seen today for physical therapy evaluation and treatment for urinary incontinence and urgency with increased frequency of urine. Pt reports she feels like she is embarrassed to have leakage and it bothers her. Her goal for PT to decrease this leakage. Pt limited in carry over of new information and benefits from extra time for processing and go over all information during session. Pt demonstrated decreased rib mobility with breathing mechanics, chest breathing noted, deficits in posture in sitting and standing mildly, and bil hip weakness. Pelvic floor assessment deferred to insure pt understood internal assessment and limited today due to time. Pt reports increased urgency, leakage with urgency and increased frequency of urination. PT discussed and gave handout for urge drill and pt reported she understood this and will  try at home. Pt reports she is unsure when she can return for follow up until she knows her work schedule and plans to call back to schedule appointments. Pt would benefit from additional  PT to further address deficits.     OBJECTIVE IMPAIRMENTS decreased coordination, decreased endurance, decreased strength, impaired flexibility, improper body mechanics, and postural dysfunction.   ACTIVITY LIMITATIONS community activity.   PERSONAL FACTORS 1 comorbidity: mild autism  are also affecting patient's functional outcome.    REHAB POTENTIAL: Good  CLINICAL DECISION MAKING: Stable/uncomplicated  EVALUATION COMPLEXITY: Low   GOALS: Goals reviewed with patient? Yes  SHORT TERM GOALS:  Pt to be I with HEP. Baseline:  Target date: 07/30/2021 Goal status: INITIAL  2.  Pt to report improved time between bladder voids to at least 2 hours for improved QOL with decreased urinary frequency.   Baseline: 1-1.5 hours Target date: 07/30/2021 Goal status: INITIAL  3.  Pt will have 50% less urgency and leakage due to bladder retraining and strengthening  Baseline: 3x per week Target date: 07/30/2021 Goal status: INITIAL  4.   Baseline:  Target date:  Goal status:   5.   Baseline:  Target date:  Goal status:   6.   Baseline:  Target date:  Goal status:   LONG TERM GOALS:  Pt to be I with advanced HEP.  Baseline:  Target date:  10/02/21 Goal status: INITIAL  2.   Pt to report improved time between bladder voids to at least 3 hours for improved QOL with decreased urinary frequency.   Baseline: 1-1.5 hours Target date: 10/02/21 Goal status: INITIAL  3.  Pt will have 50% less urgency and leakage due to bladder retraining and strengthening  Baseline: 3x per week Target date: 10/02/21 Goal status: INITIAL  4.  Pt to demonstrate bil hip strength to 4/5 for improved pelvic stability  Baseline:  Target date: 10/02/21 Goal status: INITIAL  5.   Baseline:  Target date: Goal status:    6.   Baseline:  Target date:  Goal status:  PLAN: PT FREQUENCY: 1x/week  PT DURATION:  8 sessions  PLANNED INTERVENTIONS: Therapeutic exercises, Therapeutic activity, Neuromuscular re-education, Patient/Family education, Joint mobilization, Spinal mobilization, Taping, and Manual therapy  PLAN FOR NEXT SESSION: internal if pt agreeable and appropriate vs coordinating pelvic floor mobility with tasks.     Barbaraann Faster, PT 07/02/2021, 4:35 PM

## 2021-07-02 NOTE — Patient Instructions (Signed)

## 2021-07-03 ENCOUNTER — Other Ambulatory Visit (HOSPITAL_COMMUNITY): Payer: Self-pay

## 2021-07-04 ENCOUNTER — Other Ambulatory Visit (HOSPITAL_COMMUNITY): Payer: Self-pay

## 2021-07-05 ENCOUNTER — Other Ambulatory Visit (HOSPITAL_COMMUNITY): Payer: Self-pay

## 2021-07-05 MED FILL — Norethindrone Ace-Ethinyl Estradiol-FE Cap 1 MG-20 MCG (24): ORAL | 84 days supply | Qty: 84 | Fill #2 | Status: CN

## 2021-07-09 ENCOUNTER — Other Ambulatory Visit (HOSPITAL_COMMUNITY): Payer: Self-pay

## 2021-07-09 MED FILL — Norethindrone Ace-Ethinyl Estradiol-FE Cap 1 MG-20 MCG (24): ORAL | 84 days supply | Qty: 84 | Fill #2 | Status: AC

## 2021-07-10 ENCOUNTER — Other Ambulatory Visit (HOSPITAL_COMMUNITY): Payer: Self-pay

## 2021-07-10 ENCOUNTER — Ambulatory Visit (INDEPENDENT_AMBULATORY_CARE_PROVIDER_SITE_OTHER): Payer: No Typology Code available for payment source | Admitting: Clinical

## 2021-07-10 DIAGNOSIS — F840 Autistic disorder: Secondary | ICD-10-CM | POA: Diagnosis not present

## 2021-07-10 NOTE — Progress Notes (Signed)
Date: 06/26/2021 ?Time: 11:01am-11:59am ?CPT Code: 40981X-91 ?Diagnosis Code: F84.0 ? ?Jill Alexander was seen remotely using secure video conferencing. She was in her home and the therapist was in her office at the time of the appointment. She shared that she had completed the [facility] program, and was able to see progress, but continues to feel depressed. Therapist suggested discussing the dialectical aspect of DBT in the next session: the idea that two things can be true, and that she is making progress even though she is not where she wants to be yet. She is schedule to be seen again in one week. ? ?  [place] [facility] [facility] Counselor/Therapist Progress Note ? ?Patient ID: [name], MRN: [id number],   ?  ?Individualized Treatment Plan ?Strengths: Enjoys reading, Seeking counseling, In training program through [facility], "I like humor and jokes"  ?Supports: Her dog, her bestfriend since childhood, her parents, her sister  ? ?Goal/Needs for Treatment:  ?In order of importance to patient ?1) Emotional Regulation/Anxiety ?2) Improve Positive self talk ?  ? ?Client Statement of Needs: "Find ways of being nicer to myself", cope w/ anxiety and worry  ? ?Treatment Level:Outpatient Counseling  ?Symptoms:Anxiety, Perseverative worry, Negative self talk  ?Client Treatment Preferences:weekly counseling, return to Dr. Dewayne Hatch in Feb 2023 ?Prefers to be called Jill Alexander  ? ?Healthcare consumer's goal for treatment: ? ?[name], [facility] will support the patient's ability to achieve the goals identified. Cognitive Behavioral Therapy, Supportive Counseling, Mindfulness and Relaxation Strategies and other evidenced-based practices will be used to promote progress towards healthy functioning.  ? ?Healthcare consumer will: Actively participate in therapy, working towards healthy functioning.  ?  ?*Justification for Continuation/Discontinuation of Goal: R=Revised, O=Ongoing, A=Achieved, D=Discontinued ? ?Goal 1) Jill Alexander will develop  strategies to regulate her emotions and improve her positive self talk ?Likert rating baseline date 04/12/21: How Easy - 3; How Often - 50% ?Target Date Goal Was reviewed Status Code Progress towards goal/Likert rating  ?04/12/22  New Maintained  ?     ?     ? ?Jill Alexander participated in development of tx plan and provided verbal consent.  ? ?Chrissie Noa, PhD ? ? ? ? ? ? ? ? ? ? ? ? ?Chrissie Noa, PhD ? ? ? ? ? ? ? ? ? ? ? ? ? ? ?Chrissie Noa, PhD ? ? ? ? ? ? ? ? ? ? ? ? ? ? ?Chrissie Noa, PhD ? ? ? ? ? ? ? ? ? ? ? ? ? ? ?Chrissie Noa, PhD ? ? ? ? ? ? ? ? ? ? ? ? ? ? ?Chrissie Noa, PhD ? ? ? ? ? ? ? ? ? ? ? ? ? ? ?Chrissie Noa, PhD ? ? ? ? ? ? ? ? ? ? ? ? ? ? ?Chrissie Noa, PhD ?

## 2021-07-12 ENCOUNTER — Other Ambulatory Visit (HOSPITAL_COMMUNITY): Payer: Self-pay

## 2021-07-16 ENCOUNTER — Other Ambulatory Visit (HOSPITAL_COMMUNITY): Payer: Self-pay

## 2021-07-16 ENCOUNTER — Telehealth: Payer: Self-pay | Admitting: Neurology

## 2021-07-16 NOTE — Telephone Encounter (Signed)
I called patient. It appears that psychiatry has been prescribing her nortriptyline. I encouraged her to call psychiatry for further instructions. Pt verbalized understanding. ? ?

## 2021-07-16 NOTE — Telephone Encounter (Signed)
Pt is asking for a call to discuss tapering down on her nortriptyline (PAMELOR) 10 MG capsule ,please call. ?

## 2021-07-17 ENCOUNTER — Ambulatory Visit (INDEPENDENT_AMBULATORY_CARE_PROVIDER_SITE_OTHER): Payer: No Typology Code available for payment source | Admitting: Clinical

## 2021-07-17 DIAGNOSIS — F840 Autistic disorder: Secondary | ICD-10-CM

## 2021-07-17 NOTE — Progress Notes (Signed)
Date: 06/26/2021 ?Time: 11:01am-11:59am ?CPT Code: [id number] ?Diagnosis Code: F84.0 ? ?Jill Alexander was seen remotely using secure video conferencing. She was in her home and the therapist was in her office at the time of the appointment. She had reached out to the therapist via secure email to let the therapist know that her depression symptoms are worse than she had let on. She shared that she has been experiencing continued thoughts that she is not lovable and a burden to her family. Therapist suggested trying exercises to ground her in her body so that these thoughts do not capture as much of her meditation, and took her through a simple breathing exercise. Therapist also suggested exploring non sleep deep rest for body scans to help ground her in bodily sensations. She is scheduled to be seen again in one week. ?  [place] Counselor/Therapist Progress Note ? ?Patient ID: Jill Alexander, MRN: [id number],   ?  ?Individualized Treatment Plan ?Strengths: Enjoys reading, Seeking counseling, In training program through [facility], "I like humor and jokes"  ?Supports: Her dog, her bestfriend since childhood, her parents, her sister  ? ?Goal/Needs for Treatment:  ?In order of importance to patient ?1) Emotional Regulation/Anxiety ?2) Improve Positive self talk ?  ? ?Client Statement of Needs: "Find ways of being nicer to myself", cope w/ anxiety and worry  ? ?Treatment Level:Outpatient Counseling  ?Symptoms:Anxiety, Perseverative worry, Negative self talk  ?Client Treatment Preferences:weekly counseling, return to Dr. [name] in Feb 2023 ?Prefers to be called Jill Alexander  ? ?Healthcare consumer's goal for treatment: ? ?[name], [facility] will support the patient's ability to achieve the goals identified. Cognitive Behavioral Therapy, Supportive Counseling, Mindfulness and Relaxation Strategies and other evidenced-based practices will be used to promote progress towards healthy functioning.  ? ?Healthcare  consumer will: Actively participate in therapy, working towards healthy functioning.  ?  ?*Justification for Continuation/Discontinuation of Goal: R=Revised, O=Ongoing, A=Achieved, D=Discontinued ? ?Goal 1) Jill Alexander will develop strategies to regulate her emotions and improve her positive self talk ?Likert rating baseline date 04/12/21: How Easy - 3; How Often - 50% ?Target Date Goal Was reviewed Status Code Progress towards goal/Likert rating  ?04/12/22  New Maintained  ?     ?     ? ?Jill Alexander participated in development of tx plan and provided verbal consent.  ? ?Myrtie Cruise, PhD ? ? ? ? ? ? ? ? ? ? ? ? ?Myrtie Cruise, PhD ? ? ? ? ? ? ? ? ? ? ? ? ? ? ?Myrtie Cruise, PhD ? ? ? ? ? ? ? ? ? ? ? ? ? ? ?Myrtie Cruise, PhD ? ? ? ? ? ? ? ? ? ? ? ? ? ? ?Myrtie Cruise, PhD ? ? ? ? ? ? ? ? ? ? ? ? ? ? ?Myrtie Cruise, PhD ? ? ? ? ? ? ? ? ? ? ? ? ? ? ?Myrtie Cruise, PhD ? ? ? ? ? ? ? ? ? ? ? ? ? ? ?Myrtie Cruise, PhD ? ? ? ? ? ? ? ? ? ? ? ? ? ? ?Myrtie Cruise, PhD ?

## 2021-07-19 ENCOUNTER — Other Ambulatory Visit (HOSPITAL_COMMUNITY): Payer: Self-pay

## 2021-07-20 ENCOUNTER — Other Ambulatory Visit (HOSPITAL_COMMUNITY): Payer: Self-pay

## 2021-07-23 ENCOUNTER — Other Ambulatory Visit (HOSPITAL_COMMUNITY): Payer: Self-pay

## 2021-07-24 ENCOUNTER — Ambulatory Visit (INDEPENDENT_AMBULATORY_CARE_PROVIDER_SITE_OTHER): Payer: No Typology Code available for payment source | Admitting: Clinical

## 2021-07-24 ENCOUNTER — Other Ambulatory Visit (HOSPITAL_COMMUNITY): Payer: Self-pay

## 2021-07-24 DIAGNOSIS — F840 Autistic disorder: Secondary | ICD-10-CM | POA: Diagnosis not present

## 2021-07-24 NOTE — Progress Notes (Signed)
Date: 06/26/2021 ?Time: 11:08am-11:57am ?CPT Code: [id number] ?Diagnosis Code: F84.0 ? ?Jill Alexander was seen remotely using secure video conferencing. She was in her home and the therapist was in her office at the time of the appointment. She shared that she had had a difficult week during which she had continued to struggle with feelings of inadequacy and questioning why others love her. She indicated that her mother had suggested that her decrease in mood may be due to transitioning away from [facility], and therapist reflected with Jill Alexander upon difficult transitions of the past. Therapist engaged her in a brief guided meditation. She is scheduled to be seen again in one week. ? ?  [place] [facility] Counselor/Therapist Progress Note ? ?Patient ID: Dawnell Bryant, MRN: [id number],   ?  ?Individualized Treatment Plan ?Strengths: Enjoys reading, Seeking counseling, In training program through [facility], "I like humor and jokes"  ?Supports: Her dog, her bestfriend since childhood, her parents, her sister  ? ?Goal/Needs for Treatment:  ?In order of importance to patient ?1) Emotional Regulation/Anxiety ?2) Improve Positive self talk ?  ? ?Client Statement of Needs: "Find ways of being nicer to myself", cope w/ anxiety and worry  ? ?Treatment Level:Outpatient Counseling  ?Symptoms:Anxiety, Perseverative worry, Negative self talk  ?Client Treatment Preferences:weekly counseling, return to Dr. Dewayne Hatch in Feb 2023 ?Prefers to be called Jill Alexander  ? ?Healthcare consumer's goal for treatment: ? ?[name], [facility] will support the patient's ability to achieve the goals identified. Cognitive Behavioral Therapy, Supportive Counseling, Mindfulness and Relaxation Strategies and other evidenced-based practices will be used to promote progress towards healthy functioning.  ? ?Healthcare consumer will: Actively participate in therapy, working towards healthy functioning.  ?  ?*Justification for Continuation/Discontinuation of Goal:  R=Revised, O=Ongoing, A=Achieved, D=Discontinued ? ?Goal 1) Jill Alexander will develop strategies to regulate her emotions and improve her positive self talk ?Likert rating baseline date 04/12/21: How Easy - 3; How Often - 50% ?Target Date Goal Was reviewed Status Code Progress towards goal/Likert rating  ?04/12/22  New Maintained  ?     ?     ? ?Jill Alexander participated in development of tx plan and provided verbal consent.  ? ?Chrissie Noa, PhD ? ? ? ? ? ? ? ? ? ? ? ? ?Chrissie Noa, PhD ? ? ? ? ? ? ? ? ? ? ? ? ? ? ?Chrissie Noa, PhD ? ? ? ? ? ? ? ? ? ? ? ? ? ? ?Chrissie Noa, PhD ? ? ? ? ? ? ? ? ? ? ? ? ? ? ?Chrissie Noa, PhD ? ? ? ? ? ? ? ? ? ? ? ? ? ? ?Chrissie Noa, PhD ? ? ? ? ? ? ? ? ? ? ? ? ? ? ?Chrissie Noa, PhD ? ? ? ? ? ? ? ? ? ? ? ? ? ? ?Chrissie Noa, PhD ? ? ? ? ? ? ? ? ? ? ? ? ? ? ?Chrissie Noa, PhD ? ? ? ? ? ? ? ? ? ? ? ? ? ? ?Chrissie Noa, PhD ?

## 2021-07-25 ENCOUNTER — Other Ambulatory Visit (HOSPITAL_COMMUNITY): Payer: Self-pay

## 2021-07-27 ENCOUNTER — Encounter: Payer: Self-pay | Admitting: Obstetrics and Gynecology

## 2021-07-27 ENCOUNTER — Other Ambulatory Visit (HOSPITAL_COMMUNITY): Payer: Self-pay

## 2021-07-27 ENCOUNTER — Ambulatory Visit (INDEPENDENT_AMBULATORY_CARE_PROVIDER_SITE_OTHER): Payer: No Typology Code available for payment source | Admitting: Obstetrics and Gynecology

## 2021-07-27 VITALS — BP 106/69 | HR 67 | Ht 62.0 in | Wt 137.0 lb

## 2021-07-27 DIAGNOSIS — N644 Mastodynia: Secondary | ICD-10-CM

## 2021-07-27 DIAGNOSIS — N943 Premenstrual tension syndrome: Secondary | ICD-10-CM | POA: Diagnosis not present

## 2021-07-27 MED ORDER — DROSPIRENONE-ETHINYL ESTRADIOL 3-0.02 MG PO TABS
1.0000 | ORAL_TABLET | Freq: Every day | ORAL | 11 refills | Status: DC
  Filled 2021-07-27: qty 84, 84d supply, fill #0
  Filled 2021-10-20: qty 84, 84d supply, fill #1

## 2021-07-27 NOTE — Progress Notes (Signed)
Pt here to discuss birth control. Currently on OCP ?

## 2021-07-27 NOTE — Progress Notes (Signed)
? ? ?GYNECOLOGY ENCOUNTER NOTE ? ?History:    ? Jill Alexander is a 26 y.o. G0P0000 female here for discussion about her current birth control. She is currently using Taytulla. She has had regular menstrual cycles. Since starting OCP's she has not had a period. She reports PMS symptoms prior to when her cycle is supposed to start.  She reports light menstrual cramps, mood swings, irritability, anger, cravings for foods. She cannot recall the last time she had bleeding or spotting. She had really bad cramps prior to [facility] pills and feels like the pills have helped with her cramps.  ? ?She been considering a LARC, and would like to consider this further to help control her PMS symptoms.  ? ?Right breast pain. Has felt pain in her right breast 3 x in the last 3 months. She does not feel pain now. She does not feel anything daily. Just occasional pain in her right breast. She is concerned about this. No nipple leaking or nodules.  ? ?Obstetric History ?OB History  ?Gravida Para Term Preterm AB Living  ?0 0 0 0 0 0  ?SAB IAB Ectopic Multiple Live Births  ?0 0 0 0 0  ? ? ?Past Medical History:  ?Diagnosis Date  ? Anxiety   ? Autism   ? Chronic nausea   ? Common migraine with intractable migraine 07/01/2019  ? Depression   ? Dyspepsia   ? GERD (gastroesophageal reflux disease)   ? Headache   ? Heart murmur   ? Seizures ([place])   ? Tremor, 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  ? ? ?Past Surgical History:  ?Procedure Laterality Date  ? none    ? ? ?Current Outpatient Medications on File Prior to Visit  ?Medication Sig Dispense Refill  ? acetaminophen (TYLENOL) 500 MG tablet Take 500 mg by mouth every 6 (six) hours as needed.    ? alclomethasone (ACLOVATE) 0.05 % cream Apply to affected areas on face up to 2 times daily as needed for eczema 60 g 3  ? ARIPiprazole (ABILIFY) 2 MG tablet Take 0.5 tablets (1 mg total) by mouth at bedtime. 15 tablet 1  ? betamethasone dipropionate 0.05 % cream Apply 1 application topically 2 (two) times daily to  affected areas on hands as needed for eczema(not to face,groin or underarms) 45 g 3  ? cephALEXin (KEFLEX) 500 MG capsule Take 1 capsule (500 mg total) by mouth 2 (two) times daily. 14 capsule 0  ? desonide (DESOWEN) 0.05 % cream Apply 1 application topically to affected areas of face up to 2 times daily as needed for eczema 60 g 3  ? dicyclomine (BENTYL) 10 MG capsule Take 1 capsule (10 mg total) by mouth 4 (four) times daily before meals and at bedtime. (Patient taking differently: Take 10 mg by mouth in the morning and at bedtime.) 120 capsule 1  ? Docusate Sodium (COLACE PO) Take by mouth.    ? Erenumab-aooe (AIMOVIG) 140 MG/ML SOAJ Inject 140 mg into the skin every 30 (thirty) days. 1 mL 11  ? Famotidine (PEPCID PO) Take by mouth.    ? FLUoxetine (PROZAC) 10 MG capsule Take 3 capsules (30 mg total) by mouth daily. 90 capsule 1  ? Ibuprofen (MOTRIN PO) Take 200 mg by mouth as needed.    ? lamoTRIgine (LAMICTAL) 200 MG tablet Take 1 tablet (200 mg total) by mouth 2 (two) times daily for seizures and depression 60 tablet 1  ? LORazepam (ATIVAN) 1 MG tablet Take 1 tablet (1 mg  total) by mouth 2 (two) times daily as needed. 60 tablet 1  ? LORazepam (ATIVAN) 1 MG tablet Take 1 tablet (1 mg total) by mouth 2 (two) times daily as needed for anxiety 60 tablet 1  ? melatonin 1 MG TABS tablet Take 1 tablet (1 mg total) by mouth at bedtime as needed (insomnia). 30 tablet 0  ? Multiple Vitamins-Minerals (MULTIVITAMIN PO) Take 1 tablet by mouth daily.    ? Norethin Ace-Eth Estrad-FE (TAYTULLA) 1-20 MG-MCG(24) CAPS TAKE 1 CAPSULE BY MOUTH DAILY. 28 capsule 11  ? nortriptyline (PAMELOR) 10 MG capsule Take 3 capsules (30 mg total) by mouth at bedtime for sleep and headaches 90 capsule 1  ? nortriptyline (PAMELOR) 10 MG capsule Take 2 capsules (20 mg total) by mouth at bedtime for sleep and headaches 60 capsule 1  ? omeprazole (PRILOSEC) 20 MG capsule Take 1 capsule (20 mg total) by mouth daily. 30 capsule 5  ? polyethylene  glycol (MIRALAX) packet Take 17 g by mouth daily as needed. 14 each 0  ? rizatriptan (MAXALT) 10 MG tablet Take 1 tablet (10 mg total) by mouth as needed for migraine. May repeat in 2 hours if needed. 10 tablet 11  ? topiramate (TOPAMAX) 50 MG tablet Take 3 tablets (150 mg total) by mouth at bedtime. 270 tablet 1  ? ?No current facility-administered medications on file prior to visit.  ? ? ?Allergies  ?Allergen Reactions  ? Gluten Meal   ? ? ?Social History:  reports that she has never smoked. She has never used smokeless tobacco. She reports that she does not drink alcohol and does not use drugs. ? ?Family History  ?Problem Relation Age of Onset  ? Depression Father   ? Anxiety disorder Sister   ? Bipolar disorder Sister   ? Diabetes Maternal Grandfather   ? Cancer Maternal Grandfather   ? Breast cancer Paternal Grandmother   ? Uterine cancer Paternal Grandmother   ? Irritable bowel syndrome Paternal Grandmother   ? Colon cancer Neg Hx   ? Esophageal cancer Neg Hx   ? Stomach cancer Neg Hx   ? Rectal cancer Neg Hx   ? ? ?The following portions of the patient's history were reviewed and updated as appropriate: allergies, current medications, past family history, past medical history, past social history, past surgical history and problem list. ? ?Review of Systems ?Pertinent items noted in HPI and remainder of comprehensive ROS otherwise negative. ? ?Physical Exam:  ?BP 106/69   Pulse 67   Ht 5\' 2"  (1.575 m)   Wt 137 lb (62.1 kg)   LMP  (LMP Unknown)   BMI 25.06 kg/m?  ?CONSTITUTIONAL: Well-developed, well-nourished female in no acute distress.  ?HENT:  Normocephalic ?SKIN: Skin is warm and dry. ?MUSCULOSKELETAL: Normal range of motion.  ?BREASTS: Symmetric in size. No masses, tenderness, skin changes, nipple drainage, or lymphadenopathy bilaterally. Performed in the presence of a chaperone. ?ABDOMEN: Soft, no distention noted.  No tenderness, rebound or guarding.  ?PELVIC: Deferred  ? ?Assessment and Plan:   ? ?1. Breast pain ? ? US BREAST LTD UNI RIGHT INC AXILLA; Future ? ?2. PMS (premenstrual syndrome) ? ?Will trial a change in BC pills to 20 mcg of ethinyl estradiol with 3 mg of drospirenone.  ?Rx YAZ ?Follow up in 3-6 months.  ?Instructed her to contact her therapist as PMS symptoms can be controlled with medication and therapy and lifestyle changes.  ? ? ?Janye Maynor, Artist Pais, NP ?Faculty Practice ?[facility] for [employer],  [facility]  ?

## 2021-07-30 ENCOUNTER — Other Ambulatory Visit (HOSPITAL_COMMUNITY): Payer: Self-pay

## 2021-07-31 ENCOUNTER — Ambulatory Visit (INDEPENDENT_AMBULATORY_CARE_PROVIDER_SITE_OTHER): Payer: No Typology Code available for payment source | Admitting: Clinical

## 2021-07-31 ENCOUNTER — Other Ambulatory Visit (HOSPITAL_COMMUNITY): Payer: Self-pay

## 2021-07-31 DIAGNOSIS — F840 Autistic disorder: Secondary | ICD-10-CM

## 2021-07-31 NOTE — Progress Notes (Signed)
?  Time: 11:00am-11:57am ?CPT Code: [id number] ?Diagnosis Code: F84.0 ? ?Jill Alexander was seen remotely using secure video conferencing. She was in her home and the therapist was in her office at the time of the appointment. She has continued to struggle with increased depression in the past week. She shared thoughts of inadequacy that have dominated much of her thinking. Therapist directed her back to coping strategies and encouraged her to try to notice when repetitive thought patterns emerge in order to use coping strategies. Therapist also suggested Jill Alexander's parents reach out prior to session to let therapist know of recent events in Jill Alexander's life, as she shared toward the end of session that she has a job interview coming up and would have liked to use session to prepare for it. She again provided verbal consent for the therapist to meet with her mother next week. She is scheduled to be seen again in one week. ?  [place] Counselor/Therapist Progress Note ? ?Patient ID: Jill Alexander, MRN: [id number],   ?  ?Individualized Treatment Plan ?Strengths: Enjoys reading, Seeking counseling, In training program through [facility], "I like humor and jokes"  ?Supports: Her dog, her bestfriend since childhood, her parents, her sister  ? ?Goal/Needs for Treatment:  ?In order of importance to patient ?1) Emotional Regulation/Anxiety ?2) Improve Positive self talk ?  ? ?Client Statement of Needs: "Find ways of being nicer to myself", cope w/ anxiety and worry  ? ?Treatment Level:Outpatient Counseling  ?Symptoms:Anxiety, Perseverative worry, Negative self talk  ?Client Treatment Preferences:weekly counseling, return to Dr. [name] in Feb 2023 ?Prefers to be called Jill Alexander  ? ?Healthcare consumer's goal for treatment: ? ?[name], [facility] will support the patient's ability to achieve the goals identified. Cognitive Behavioral Therapy, Supportive Counseling, Mindfulness and Relaxation Strategies and other  evidenced-based practices will be used to promote progress towards healthy functioning.  ? ?Healthcare consumer will: Actively participate in therapy, working towards healthy functioning.  ?  ?*Justification for Continuation/Discontinuation of Goal: R=Revised, O=Ongoing, A=Achieved, D=Discontinued ? ?Goal 1) Jill Alexander will develop strategies to regulate her emotions and improve her positive self talk ?Likert rating baseline date 04/12/21: How Easy - 3; How Often - 50% ?Target Date Goal Was reviewed Status Code Progress towards goal/Likert rating  ?04/12/22  New Maintained  ?     ?     ? ?Jill Alexander participated in development of tx plan and provided verbal consent.  ? ?Myrtie Cruise, PhD ? ? ? ? ? ? ? ? ? ? ? ? ?Myrtie Cruise, PhD ? ? ? ? ? ? ? ? ? ? ? ? ? ? ?Myrtie Cruise, PhD ? ? ? ? ? ? ? ? ? ? ? ? ? ? ?Myrtie Cruise, PhD ? ? ? ? ? ? ? ? ? ? ? ? ? ? ?Myrtie Cruise, PhD ? ? ? ? ? ? ? ? ? ? ? ? ? ? ?Myrtie Cruise, PhD ? ? ? ? ? ? ? ? ? ? ? ? ? ? ?Myrtie Cruise, PhD ? ? ? ? ? ? ? ? ? ? ? ? ? ? ?Myrtie Cruise, PhD ? ? ? ? ? ? ? ? ? ? ? ? ? ? ?Myrtie Cruise, PhD ? ? ? ? ? ? ? ? ? ? ? ? ? ? ?Myrtie Cruise, PhD ? ? ? ? ? ? ? ? ? ? ? ? ? ? ?Myrtie Cruise, PhD ?

## 2021-08-01 ENCOUNTER — Other Ambulatory Visit (HOSPITAL_COMMUNITY): Payer: Self-pay

## 2021-08-01 ENCOUNTER — Telehealth: Payer: Self-pay | Admitting: Neurology

## 2021-08-01 NOTE — Telephone Encounter (Signed)
Pt's mother has called to report that the office [facility] with a Lesia Hausen, RN is asking to speak with clinical team re: pt continuing FLUoxetine (PROZAC) 10 MG capsule.  Rhae Hammock, RN can be reached at [phone]  ?

## 2021-08-02 ENCOUNTER — Other Ambulatory Visit (HOSPITAL_COMMUNITY): Payer: Self-pay

## 2021-08-02 NOTE — Telephone Encounter (Signed)
I called the office. They are currently closed. ?

## 2021-08-02 NOTE — Telephone Encounter (Signed)
I spoke to Lesia Hausen, [employer] fund[employer]. Confirmed with her that fluoxetine 10mg , one tab daily is showing active on her medication list. However, our office does not manage this prescription. She let me know they would be authorizing refills for the patient.  ?

## 2021-08-02 NOTE — Telephone Encounter (Addendum)
Left message for Lesia Hausen, RN to call me back. ?

## 2021-08-03 ENCOUNTER — Other Ambulatory Visit (HOSPITAL_COMMUNITY): Payer: Self-pay

## 2021-08-06 ENCOUNTER — Other Ambulatory Visit (HOSPITAL_COMMUNITY): Payer: Self-pay

## 2021-08-07 ENCOUNTER — Other Ambulatory Visit (HOSPITAL_COMMUNITY): Payer: Self-pay

## 2021-08-07 ENCOUNTER — Ambulatory Visit (INDEPENDENT_AMBULATORY_CARE_PROVIDER_SITE_OTHER): Payer: No Typology Code available for payment source | Admitting: Clinical

## 2021-08-07 DIAGNOSIS — F840 Autistic disorder: Secondary | ICD-10-CM | POA: Diagnosis not present

## 2021-08-07 MED ORDER — ARIPIPRAZOLE 2 MG PO TABS
1.0000 mg | ORAL_TABLET | Freq: Every day | ORAL | 2 refills | Status: DC
  Filled 2021-08-07: qty 15, 30d supply, fill #0
  Filled 2021-09-11: qty 15, 30d supply, fill #1
  Filled 2021-10-20: qty 15, 30d supply, fill #2

## 2021-08-07 MED ORDER — FLUOXETINE HCL 10 MG PO CAPS
30.0000 mg | ORAL_CAPSULE | Freq: Every day | ORAL | 2 refills | Status: DC
  Filled 2021-08-07 – 2021-08-31 (×2): qty 90, 30d supply, fill #0
  Filled 2021-10-02: qty 90, 30d supply, fill #1
  Filled 2021-10-30: qty 90, 30d supply, fill #2

## 2021-08-07 MED ORDER — LAMOTRIGINE 200 MG PO TABS
200.0000 mg | ORAL_TABLET | Freq: Two times a day (BID) | ORAL | 2 refills | Status: DC
  Filled 2021-08-07 – 2021-09-19 (×2): qty 60, 30d supply, fill #0
  Filled 2021-10-20: qty 60, 30d supply, fill #1
  Filled 2021-11-19: qty 60, 30d supply, fill #2

## 2021-08-07 NOTE — Progress Notes (Signed)
Time: 11:00am-11:55am ?CPT Code: [id number] ?Diagnosis Code: F84.0 ? ?Jill Alexander was seen remotely using secure video conferencing. She was in her home and the therapist was in her office at the time of the appointment. She reflected upon another emotionally challenging week, during which she had been upset by feedback provided by her parents, even though she knew it had been intended as constructive. Therapist encouraged her to consider coping strategies. Therapist also discussed the scheduled appointment for the therapist to meet with Jill Alexander's mother tomorrow. Jill Alexander is scheduled to be seen again in one week. ?  [place] [facility] Counselor/Therapist Progress Note ? ?Patient ID: Jill Alexander, MRN: [id number],   ?  ?Individualized Treatment Plan ?Strengths: Enjoys reading, Seeking counseling, In training program through [facility], "I like humor and jokes"  ?Supports: Her dog, her bestfriend since childhood, her parents, her sister  ? ?Goal/Needs for Treatment:  ?In order of importance to patient ?1) Emotional Regulation/Anxiety ?2) Improve Positive self talk ?  ? ?Client Statement of Needs: "Find ways of being nicer to myself", cope w/ anxiety and worry  ? ?Treatment Level:Outpatient Counseling  ?Symptoms:Anxiety, Perseverative worry, Negative self talk  ?Client Treatment Preferences:weekly counseling, return to Dr. Dewayne Hatch in Feb 2023 ?Prefers to be called Jill Alexander  ? ?Healthcare consumer's goal for treatment: ? ?[name], [facility] will support the patient's ability to achieve the goals identified. Cognitive Behavioral Therapy, Supportive Counseling, Mindfulness and Relaxation Strategies and other evidenced-based practices will be used to promote progress towards healthy functioning.  ? ?Healthcare consumer will: Actively participate in therapy, working towards healthy functioning.  ?  ?*Justification for Continuation/Discontinuation of Goal: R=Revised, O=Ongoing, A=Achieved, D=Discontinued ? ?Goal 1) Jill Alexander  will develop strategies to regulate her emotions and improve her positive self talk ?Likert rating baseline date 04/12/21: How Easy - 3; How Often - 50% ?Target Date Goal Was reviewed Status Code Progress towards goal/Likert rating  ?04/12/22  New Maintained  ?     ?     ? ?Jill Alexander participated in development of tx plan and provided verbal consent.  ? ?Chrissie Noa, PhD ? ? ? ? ? ? ? ? ? ? ? ? ?Chrissie Noa, PhD ? ? ? ? ? ? ? ? ? ? ? ? ? ? ?Chrissie Noa, PhD ? ? ? ? ? ? ? ? ? ? ? ? ? ? ?Chrissie Noa, PhD ? ? ? ? ? ? ? ? ? ? ? ? ? ? ?Chrissie Noa, PhD ? ? ? ? ? ? ? ? ? ? ? ? ? ? ?Chrissie Noa, PhD ? ? ? ? ? ? ? ? ? ? ? ? ? ? ?Chrissie Noa, PhD ? ? ? ? ? ? ? ? ? ? ? ? ? ? ?Chrissie Noa, PhD ? ? ? ? ? ? ? ? ? ? ? ? ? ? ?Chrissie Noa, PhD ? ? ? ? ? ? ? ? ? ? ? ? ? ? ?Chrissie Noa, PhD ? ? ? ? ? ? ? ? ? ? ? ? ? ? ?Chrissie Noa, PhD ? ? ? ? ? ? ? ? ? ? ? ? ? ? ?Chrissie Noa, PhD ?

## 2021-08-08 ENCOUNTER — Ambulatory Visit (INDEPENDENT_AMBULATORY_CARE_PROVIDER_SITE_OTHER): Payer: No Typology Code available for payment source | Admitting: Clinical

## 2021-08-08 ENCOUNTER — Other Ambulatory Visit (HOSPITAL_COMMUNITY): Payer: Self-pay

## 2021-08-08 DIAGNOSIS — F840 Autistic disorder: Secondary | ICD-10-CM

## 2021-08-08 MED ORDER — LORAZEPAM 1 MG PO TABS
1.0000 mg | ORAL_TABLET | Freq: Two times a day (BID) | ORAL | 2 refills | Status: DC
  Filled 2021-09-03: qty 60, 30d supply, fill #0
  Filled 2021-10-09: qty 60, 30d supply, fill #1
  Filled 2021-10-18: qty 60, 30d supply, fill #2

## 2021-08-08 NOTE — Progress Notes (Addendum)
?    Time: 9:00-9:56am ?CPT Code: 97353G-99 ?Diagnosis: F84.0 ? ?Jill Alexander's mother was seen remotely using secure video conferencing due to the COVID19 pandemic, with Jill Alexander's consent. She was in her office in [place] and the therapist was in her office.She shared concerns about Jill Alexander's stress level. Session focused on communication strategies, including setting clear boundaries for when is ok to talk and when is not (including the use of visual cues). She also created a plan to secure message the therapist prior to Jill Alexander's sessions to provide input on recent events in the home. Jill Alexander is scheduled to be seen again in one week ? ? ? ? ? ? ?[facility] Counselor/Therapist Progress Note ? ?Patient ID: Jill Alexander, MRN: [id number],   ?  ?Individualized Treatment Plan ?Strengths: Enjoys reading, Seeking counseling, In training program through [employer], "I like humor and jokes"  ?Supports: Her dog, her bestfriend since childhood, her parents, her sister  ? ?Goal/Needs for Treatment:  ?In order of importance to patient ?1) Emotional Regulation/Anxiety ?2) Improve Positive self talk ?  ? ?Client Statement of Needs: "Find ways of being nicer to myself", cope w/ anxiety and worry  ? ?Treatment Level:Outpatient Counseling  ?Symptoms:Anxiety, Perseverative worry, Negative self talk  ?Client Treatment Preferences:weekly counseling, return to Dr. Dewayne Hatch in Feb 2023 ?Prefers to be called Jill Alexander  ? ?Healthcare consumer's goal for treatment: ? ?[name], [facility] will support the patient's ability to achieve the goals identified. Cognitive Behavioral Therapy, Supportive Counseling, Mindfulness and Relaxation Strategies and other evidenced-based practices will be used to promote progress towards healthy functioning.  ? ?Healthcare consumer will: Actively participate in therapy, working towards healthy functioning.  ?  ?*Justification for Continuation/Discontinuation of Goal: R=Revised, O=Ongoing, A=Achieved,  D=Discontinued ? ?Goal 1) Jill Alexander will develop strategies to regulate her emotions and improve her positive self talk ?Likert rating baseline date 04/12/21: How Easy - 3; How Often - 50% ?Target Date Goal Was reviewed Status Code Progress towards goal/Likert rating  ?04/12/22  New Maintained  ?     ?     ? ?Jill Alexander participated in development of tx plan and provided verbal consent.  ? ? ? ? ? ? ? ? ? ? ? ? ? ? ? ?Chrissie Noa, PhD ? ? ? ? ? ? ? ? ? ? ? ? ? ? ?Chrissie Noa, PhD ? ? ? ? ? ? ?Chrissie Noa, PhD ?

## 2021-08-13 ENCOUNTER — Ambulatory Visit
Admission: RE | Admit: 2021-08-13 | Discharge: 2021-08-13 | Disposition: A | Discharge status: Home / Self Care | Payer: No Typology Code available for payment source | Source: Ambulatory Visit | Attending: Obstetrics and Gynecology | Admitting: Obstetrics and Gynecology

## 2021-08-13 DIAGNOSIS — N644 Mastodynia: Secondary | ICD-10-CM

## 2021-08-14 ENCOUNTER — Ambulatory Visit (INDEPENDENT_AMBULATORY_CARE_PROVIDER_SITE_OTHER): Payer: No Typology Code available for payment source | Admitting: Clinical

## 2021-08-14 DIAGNOSIS — F840 Autistic disorder: Secondary | ICD-10-CM | POA: Diagnosis not present

## 2021-08-14 NOTE — Progress Notes (Addendum)
Time: 9:00-9:56am ?CPT Code: 45848L-50 ?Diagnosis: F84.0 ? ?Jill Alexander was seen remotely using secure video conferencing. She was in her home and the therapist was in her office at the time of the appointment. She had reached out to the therapist prior to session to query whether she may have narcissistic PD. Session focused on working through the diagnostic criteria. Jill Alexander determined that she does not meet criteria for NPD. She then queried whether she may have OCD. Therapist checked in as to her mood, which she described as having worsened since her last session. Therapist suggested prioritizing coping strategies in her next session, then working through additional diagnoses if there is remaining time, and this appeared to resonate with her. She is scheduled to be seen again in one week. ? ?   [facility] Counselor/Therapist Progress Note ?  ?Patient ID: Jill Alexander, MRN: [id number],   ?  ?Individualized Treatment Plan ?Strengths: Enjoys reading, Seeking counseling, In training program through [employer], "I like humor and jokes"  ?Supports: Her dog, her bestfriend since childhood, her parents, her sister  ?  ?Goal/Needs for Treatment:  ?In order of importance to patient ?1) Emotional Regulation/Anxiety ?2) Improve Positive self talk ?   ?  ?Client Statement of Needs: "Find ways of being nicer to myself", cope w/ anxiety and worry  ?  ?Treatment Level:Outpatient Counseling  ?Symptoms:Anxiety, Perseverative worry, Negative self talk  ?Client Treatment Preferences:weekly counseling, return to Dr. [name] in Feb 2023 ?Prefers to be called Jill Alexander  ?  ?Healthcare consumer's goal for treatment: ?  ?[name], [facility] will support the patient's ability to achieve the goals identified. Cognitive Behavioral Therapy, Supportive Counseling, Mindfulness and Relaxation Strategies and other evidenced-based practices will be used to promote progress towards healthy functioning.  ?  ?Healthcare consumer will:  Actively participate in therapy, working towards healthy functioning.  ?   ?*Justification for Continuation/Discontinuation of Goal: R=Revised, O=Ongoing, A=Achieved, D=Discontinued ?  ?Goal 1) Jill Alexander will develop strategies to regulate her emotions and improve her positive self talk ?Likert rating baseline date 04/12/21: How Easy - 3; How Often - 50% ?Target Date Goal Was reviewed Status Code Progress towards goal/Likert rating  ?04/12/22   New Maintained  ?         ?         ?  ?Jill Alexander participated in development of tx plan and provided verbal consent.  ?  ?Myrtie Cruise, PhD ? ? ? ? ? ? ? ? ? ? ?Myrtie Cruise, PhD ? ? ? ? ? ? ? ? ? ? ? ? ? ? ?Myrtie Cruise, PhD ?

## 2021-08-21 ENCOUNTER — Ambulatory Visit (INDEPENDENT_AMBULATORY_CARE_PROVIDER_SITE_OTHER): Payer: No Typology Code available for payment source | Admitting: Clinical

## 2021-08-21 DIAGNOSIS — F840 Autistic disorder: Secondary | ICD-10-CM

## 2021-08-21 NOTE — Progress Notes (Addendum)
Time: 11:00-11:56am ?CPT Code: [id number] ?Diagnosis: F84.0 ? ?Jill Alexander was seen remotely using secure video conferencing. She was in her home and the therapist was in her home at the time of the appointment. Jill Alexander's mother had reached out prior to the appointment to share that Jill Alexander continues to struggle with her mood, but had had a very good job interview at a [employer] she likes. Session focused on processing this. Jill Alexander shared that she had been invited for a "trial" during the coming week. Therapist suggested that, when she catastrophizes, she can check in with herself, asking "has that ever happened before?" If the worry has happened before, she can examine for how it led her to where she is today. If not, she can reflect upon how it may be something that is pretty unlikely. She is scheduled to be seen again in one week. ? ?   [place] Counselor/Therapist Progress Note ?  ?Patient ID: Jill Alexander, MRN: [id number],   ?  ?Individualized Treatment Plan ?Strengths: Enjoys reading, Seeking counseling, In training program through [employer], "I like humor and jokes"  ?Supports: Her dog, her bestfriend since childhood, her parents, her sister  ?  ?Goal/Needs for Treatment:  ?In order of importance to patient ?1) Emotional Regulation/Anxiety ?2) Improve Positive self talk ?   ?  ?Client Statement of Needs: "Find ways of being nicer to myself", cope w/ anxiety and worry  ?  ?Treatment Level:Outpatient Counseling  ?Symptoms:Anxiety, Perseverative worry, Negative self talk  ?Client Treatment Preferences:weekly counseling, return to Dr. [name] in Feb 2023 ?Prefers to be called Jill Alexander  ?  ?Healthcare consumer's goal for treatment: ?  ?[name], [facility] will support the patient's ability to achieve the goals identified. Cognitive Behavioral Therapy, Supportive Counseling, Mindfulness and Relaxation Strategies and other evidenced-based practices will be used to promote progress towards healthy  functioning.  ?  ?Healthcare consumer will: Actively participate in therapy, working towards healthy functioning.  ?   ?*Justification for Continuation/Discontinuation of Goal: R=Revised, O=Ongoing, A=Achieved, D=Discontinued ?  ?Goal 1) Jill Alexander will develop strategies to regulate her emotions and improve her positive self talk ?Likert rating baseline date 04/12/21: How Easy - 3; How Often - 50% ?Target Date Goal Was reviewed Status Code Progress towards goal/Likert rating  ?04/12/22   New Maintained  ?         ?         ?  ?Jill Alexander participated in development of tx plan and provided verbal consent.  ?  ?Myrtie Cruise, PhD ?  ?  ?  ?  ?  ?  ?  ?  ? ? ? ? ? ? ? ? ?Myrtie Cruise, PhD ? ? ? ? ? ? ? ? ? ? ? ? ? ? ?Myrtie Cruise, PhD ? ? ? ? ? ? ? ? ? ? ? ? ? ? ?Myrtie Cruise, PhD ?

## 2021-08-22 ENCOUNTER — Ambulatory Visit: Payer: No Typology Code available for payment source | Attending: Urology | Admitting: Physical Therapy

## 2021-08-22 DIAGNOSIS — R279 Unspecified lack of coordination: Secondary | ICD-10-CM | POA: Insufficient documentation

## 2021-08-22 DIAGNOSIS — M6281 Muscle weakness (generalized): Secondary | ICD-10-CM | POA: Diagnosis present

## 2021-08-22 DIAGNOSIS — R293 Abnormal posture: Secondary | ICD-10-CM | POA: Insufficient documentation

## 2021-08-22 NOTE — Therapy (Addendum)
OUTPATIENT PHYSICAL THERAPY TREATMENT NOTE   Patient Name: Jill Alexander MRN: [id number] DOB:1996-04-01, 26 y.o., female Today's Date: 08/22/2021  PCP: Marcellina Millin REFERRING PROVIDER: Robley Fries, MD  END OF SESSION:   PT End of Session - 08/22/21 1537     Visit Number 2    Date for PT Re-Evaluation 10/02/21    Authorization Type [facility] [employer] plan    PT Start Time 1533    PT Stop Time 1612    PT Time Calculation (min) 39 min    Activity Tolerance Patient tolerated treatment well    Behavior During Therapy [facility] for tasks assessed/performed             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   Past Surgical History:  Procedure Laterality Date   none     Patient Active Problem List   Diagnosis Date Noted   Common migraine with intractable migraine 07/01/2019   Intermittent headache 10/26/2018   Epigastric pain 10/26/2018   Gastroesophageal reflux disease 10/26/2018   Bipolar 2 disorder ([place]) 06/02/2018   Panic attacks 06/02/2018   Autism spectrum disorder 05/06/2018   Anxiety and depression 05/06/2018   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 Pt reports she has not attempted urge drill since last visit as she forgot about this.   PAIN:  Are you having pain? No   OBJECTIVE: (objective measures completed at initial evaluation unless otherwise dated)      PATIENT SURVEYS:    [id number]   COGNITION:            Overall cognitive status:  pt has mild autism and benefits from extra time to answer all questions and progress information                              SENSATION:            Light touch: Appears intact            Proprioception: Appears intact   MUSCLE LENGTH: Hamstrings and adductors bil limited by 25%   POSTURE:  Rounded shoulders, posterior pelvic tilt   PALPATION: Internal Pelvic Floor deferred at this time   External Perineal Exam pelvic floor deferred    GENERAL deferred   LUMBARAROM/PROM   Lumbar WFL   LE AROM/PROM:   WFL bil LE   LE MMT:   Bil hips 3+/5 hip flexion and abduction and 4/5 adduction and hamstrings   PELVIC MMT: deferred at this time   MMT   07/02/2021  Vaginal  Internal Anal Sphincter    External Anal Sphincter    Puborectalis    Diastasis Recti    (Blank rows = not tested)     TONE: deferred at this time   PROLAPSE: deferred at this time     TODAY'S TREATMENT   08/22/2021: Pt educated on urge drill again as she reported she had forgotten about this since eval. Handout provided and reviewed.  Pt also educated on HEP with diaphragmatic breathing and pelvic quick flicks to decrease urgency and frequency.   Pt also directed in 2x10 diaphragmatic breathing reps in sitting with VC and tactile cues and extra time to compete, ball squeezes also attempted which slightly improved.   EVAL no treatment completed this date due to time as pt arrived late to appointment     PATIENT EDUCATION:  Education details: urge drill  Person educated: Patient Education method: Explanation, Demonstration, Tactile cues, Verbal cues, and Handouts Education  comprehension: verbalized understanding and returned demonstration     HOME EXERCISE PROGRAM: Urge drill given and discussed during session pt denied additional questions   ASSESSMENT:   CLINICAL IMPRESSION: Patient presents to clinic reporting similar symptoms and urinating ~every hour but limited as her bathroom is being worked on and she doesn't always want to go to downstairs bathroom so tries to wait longer but then will have some leakage. Pt educated on urge drill, HEP, diaphragmatic breathing techniques. Pt requires extra time to process information and needs a few different attempts to go over information to insure understanding, pt also benefits from extra time for her to ask questions to make sure he understands well. Pt demonstrates difficulty with coordination and needs extra time for cues to improve this. Pt  Pt would benefit from additional PT to further address deficits.        OBJECTIVE IMPAIRMENTS decreased coordination, decreased endurance, decreased strength, impaired flexibility, improper body mechanics, and postural dysfunction.    ACTIVITY LIMITATIONS community activity.    PERSONAL FACTORS 1 comorbidity: mild autism  are also affecting patient's functional outcome.      REHAB POTENTIAL: Good   CLINICAL DECISION MAKING: Stable/uncomplicated   EVALUATION COMPLEXITY: Low     GOALS: Goals reviewed with patient? Yes   SHORT TERM GOALS:   Pt to be I with HEP. Baseline:  Target date: 07/30/2021 Goal status: INITIAL   2.  Pt to report improved time between bladder voids to at least 2 hours for improved QOL with decreased urinary frequency.   Baseline: 1-1.5 hours Target date: 07/30/2021 Goal status: INITIAL   3.  Pt will have 50% less urgency and leakage due to bladder retraining and strengthening  Baseline: 3x per week Target date: 07/30/2021 Goal status: INITIAL      LONG TERM GOALS:   Pt to be I with advanced HEP.   Baseline:  Target date:   10/02/21 Goal status: INITIAL   2.   Pt to report improved time between bladder voids to at least 3 hours for improved QOL with decreased urinary frequency.   Baseline: 1-1.5 hours Target date: 10/02/21 Goal status: INITIAL   3.  Pt will have 50% less urgency and leakage due to bladder retraining and strengthening  Baseline: 3x per week Target date: 10/02/21 Goal status: INITIAL   4.  Pt to demonstrate bil hip strength to 4/5 for improved pelvic stability  Baseline:  Target date: 10/02/21 Goal status: INITIAL      PLAN: PT FREQUENCY: 1x/week   PT DURATION:  8  sessions   PLANNED INTERVENTIONS: Therapeutic exercises, Therapeutic activity, Neuromuscular re-education, Patient/Family education, Joint mobilization, Spinal mobilization, Taping, and Manual therapy   PLAN FOR NEXT SESSION: internal if pt agreeable and appropriate vs coordinating pelvic floor mobility with tasks.      Stacy Gardner, PT, DPT 04/26/234:15 PM    PHYSICAL THERAPY DISCHARGE SUMMARY  Visits from Start of Care: 2  Current functional level related to goals / functional outcomes: Unable to formally reassess as pt has not returned since last visit   Remaining deficits: Unable to formally reassess   Education / Equipment: HEP   Patient agrees to discharge. Patient goals were partially met. Patient is being discharged due to not returning since the last visit.   Stacy Gardner, PT, DPT 08/30/233:27 PM

## 2021-08-22 NOTE — Patient Instructions (Signed)

## 2021-08-28 ENCOUNTER — Ambulatory Visit (INDEPENDENT_AMBULATORY_CARE_PROVIDER_SITE_OTHER): Payer: No Typology Code available for payment source | Admitting: Clinical

## 2021-08-28 DIAGNOSIS — F840 Autistic disorder: Secondary | ICD-10-CM

## 2021-08-28 NOTE — Progress Notes (Signed)
Time: 11:00-11:56am ?CPT Code: [id number]-40 ?Diagnosis: F84.0 ? ?Jill Alexander was seen remotely using secure video conferencing. She was in her home and the therapist was in her home at the time of the appointment. Jill Alexander shared that she had initially misunderstood herself to be on trial with a potential new job at a local [employer], and had actually gotten the job and was in training. Session focused on processing this achievement and taking time to reflect upon her success, in contrast to the negative thoughts she has had about herself and her ability to complete school and find a job. She will continue to do so for homework. She is scheduled to be seen again in one week. ? ?   [facility] Counselor/Therapist Progress Note ?  ?Patient ID: [name], MRN: [id number],   ?  ?Individualized Treatment Plan ?Strengths: Enjoys reading, Seeking counseling, In training program through [employer], "I like humor and jokes"  ?Supports: Her dog, her bestfriend since childhood, her parents, her sister  ?  ?Goal/Needs for Treatment:  ?In order of importance to patient ?1) Emotional Regulation/Anxiety ?2) Improve Positive self talk ?   ?  ?Client Statement of Needs: "Find ways of being nicer to myself", cope w/ anxiety and worry  ?  ?Treatment Level:Outpatient Counseling  ?Symptoms:Anxiety, Perseverative worry, Negative self talk  ?Client Treatment Preferences:weekly counseling, return to Dr. Dewayne Hatch in Feb 2023 ?Prefers to be called Jill Alexander  ?  ?Healthcare consumer's goal for treatment: ?  ?[name], [facility] will support the patient's ability to achieve the goals identified. Cognitive Behavioral Therapy, Supportive Counseling, Mindfulness and Relaxation Strategies and other evidenced-based practices will be used to promote progress towards healthy functioning.  ?  ?Healthcare consumer will: Actively participate in therapy, working towards healthy functioning.  ?   ?*Justification for Continuation/Discontinuation of  Goal: R=Revised, O=Ongoing, A=Achieved, D=Discontinued ?  ?Goal 1) Jill Alexander will develop strategies to regulate her emotions and improve her positive self talk ?Likert rating baseline date 04/12/21: How Easy - 3; How Often - 50% ?Target Date Goal Was reviewed Status Code Progress towards goal/Likert rating  ?04/12/22   New Maintained  ?         ?         ?  ?Jill Alexander participated in development of tx plan and provided verbal consent.  ?  ?Chrissie Noa, PhD ?  ?  ?  ?  ?  ?  ?  ?  ? ? ? ? ? ? ? ? ?Chrissie Noa, PhD ? ? ? ? ? ? ? ? ? ? ? ? ? ? ?Chrissie Noa, PhD ? ? ? ? ? ? ? ? ? ? ? ? ? ? ?Chrissie Noa, PhD ? ? ? ? ? ? ? ? ? ? ? ? ? ? ?Chrissie Noa, PhD ?

## 2021-08-29 ENCOUNTER — Other Ambulatory Visit (HOSPITAL_COMMUNITY): Payer: Self-pay

## 2021-08-30 ENCOUNTER — Other Ambulatory Visit (HOSPITAL_COMMUNITY): Payer: Self-pay

## 2021-08-31 ENCOUNTER — Other Ambulatory Visit (HOSPITAL_COMMUNITY): Payer: Self-pay

## 2021-09-03 ENCOUNTER — Other Ambulatory Visit (HOSPITAL_COMMUNITY): Payer: Self-pay

## 2021-09-04 ENCOUNTER — Ambulatory Visit (INDEPENDENT_AMBULATORY_CARE_PROVIDER_SITE_OTHER): Payer: No Typology Code available for payment source | Admitting: Clinical

## 2021-09-04 ENCOUNTER — Other Ambulatory Visit (HOSPITAL_COMMUNITY): Payer: Self-pay

## 2021-09-04 DIAGNOSIS — F840 Autistic disorder: Secondary | ICD-10-CM | POA: Diagnosis not present

## 2021-09-04 NOTE — Progress Notes (Signed)
Time: 11:00-11:56am ?CPT Code: [id number]-77 ?Diagnosis: F84.0 ? ?Jill Alexander was seen remotely using secure video conferencing. She was in her home and the therapist was in her home at the time of the appointment. She shared that she was feeling stressed due to regretting comments she had made toward her mother while angry. Therapist encouraged her to use STOP skills the next time she has this urge, and to remember that she may be experiencing emotion mind fairly frequently due to the stress of transitioning to a new job, coupled with stressors of the past few months. Therapist sent her two TED talks about grit at her request. She is scheduled to be seen again in one week. ? ? ?   [facility] Counselor/Therapist Progress Note ?  ?Patient ID: Jill Alexander, MRN: [id number],   ?  ?Individualized Treatment Plan ?Strengths: Enjoys reading, Seeking counseling, In training program through [employer], "I like humor and jokes"  ?Supports: Her dog, her bestfriend since childhood, her parents, her sister  ?  ?Goal/Needs for Treatment:  ?In order of importance to patient ?1) Emotional Regulation/Anxiety ?2) Improve Positive self talk ?   ?  ?Client Statement of Needs: "Find ways of being nicer to myself", cope w/ anxiety and worry  ?  ?Treatment Level:Outpatient Counseling  ?Symptoms:Anxiety, Perseverative worry, Negative self talk  ?Client Treatment Preferences:weekly counseling, return to Dr. Dewayne Hatch in Feb 2023 ?Prefers to be called Jill Alexander  ?  ?Healthcare consumer's goal for treatment: ?  ?[name], [facility] will support the patient's ability to achieve the goals identified. Cognitive Behavioral Therapy, Supportive Counseling, Mindfulness and Relaxation Strategies and other evidenced-based practices will be used to promote progress towards healthy functioning.  ?  ?Healthcare consumer will: Actively participate in therapy, working towards healthy functioning.  ?   ?*Justification for Continuation/Discontinuation  of Goal: R=Revised, O=Ongoing, A=Achieved, D=Discontinued ?  ?Goal 1) Jill Alexander will develop strategies to regulate her emotions and improve her positive self talk ?Likert rating baseline date 04/12/21: How Easy - 3; How Often - 50% ?Target Date Goal Was reviewed Status Code Progress towards goal/Likert rating  ?04/12/22   New Maintained  ?         ?         ?  ?Jill Alexander participated in development of tx plan and provided verbal consent.  ?  ?Chrissie Noa, PhD ?  ?  ?  ?  ?  ?  ?  ?  ? ? ? ? ? ? ? ? ?Chrissie Noa, PhD ? ? ? ? ? ? ? ? ? ? ? ? ? ? ?Chrissie Noa, PhD ? ? ? ? ? ? ? ? ? ? ? ? ? ? ?Chrissie Noa, PhD ? ? ? ? ? ? ? ? ? ? ? ? ? ? ?Chrissie Noa, PhD ? ? ? ? ? ? ? ? ? ? ? ? ? ? ?Chrissie Noa, PhD ?

## 2021-09-07 ENCOUNTER — Other Ambulatory Visit (HOSPITAL_COMMUNITY): Payer: Self-pay

## 2021-09-10 ENCOUNTER — Other Ambulatory Visit (HOSPITAL_COMMUNITY): Payer: Self-pay

## 2021-09-11 ENCOUNTER — Ambulatory Visit (INDEPENDENT_AMBULATORY_CARE_PROVIDER_SITE_OTHER): Payer: No Typology Code available for payment source | Admitting: Clinical

## 2021-09-11 DIAGNOSIS — F840 Autistic disorder: Secondary | ICD-10-CM | POA: Diagnosis not present

## 2021-09-11 NOTE — Progress Notes (Signed)
Time: 11:04-11:56am ?CPT Code: [id number]-[id number] ?Diagnosis: F84.0 ? ?Jill Alexander was seen remotely using secure video conferencing. She was in her home and the therapist was in her office at the time of the appointment. She shared that her mood has been stable overall, and she has completed training for her new job. Therapist suggested communication strategies to help her navigate several situations at work, and worked with her to create a script to use for a phone call to a support group she is interested in. She is scheduled to be seen again in one week.  ? ?   [facility] Counselor/Therapist Progress Note ?  ?Patient ID: Jill Alexander, MRN: [id number],   ?  ?Individualized Treatment Plan ?Strengths: Enjoys reading, Seeking counseling, In training program through [employer], "I like humor and jokes"  ?Supports: Her dog, her bestfriend since childhood, her parents, her sister  ?  ?Goal/Needs for Treatment:  ?In order of importance to patient ?1) Emotional Regulation/Anxiety ?2) Improve Positive self talk ?   ?  ?Client Statement of Needs: "Find ways of being nicer to myself", cope w/ anxiety and worry  ?  ?Treatment Level:Outpatient Counseling  ?Symptoms:Anxiety, Perseverative worry, Negative self talk  ?Client Treatment Preferences:weekly counseling, return to Dr. Dewayne Hatch in Feb 2023 ?Prefers to be called Jill Alexander  ?  ?Healthcare consumer's goal for treatment: ?  ?[name], [facility] will support the patient's ability to achieve the goals identified. Cognitive Behavioral Therapy, Supportive Counseling, Mindfulness and Relaxation Strategies and other evidenced-based practices will be used to promote progress towards healthy functioning.  ?  ?Healthcare consumer will: Actively participate in therapy, working towards healthy functioning.  ?   ?*Justification for Continuation/Discontinuation of Goal: R=Revised, O=Ongoing, A=Achieved, D=Discontinued ?  ?Goal 1) Jill Alexander will develop strategies to regulate her emotions  and improve her positive self talk ?Likert rating baseline date 04/12/21: How Easy - 3; How Often - 50% ?Target Date Goal Was reviewed Status Code Progress towards goal/Likert rating  ?04/12/22   New Maintained  ?         ?         ?  ?Jill Alexander participated in development of tx plan and provided verbal consent.  ?  ?Chrissie Noa, PhD ?  ?  ?  ?  ?  ?  ?  ?  ? ? ? ? ? ? ? ? ?Chrissie Noa, PhD ? ? ? ? ? ? ? ? ? ? ? ? ? ? ?Chrissie Noa, PhD ? ? ? ? ? ? ? ? ? ? ? ? ? ? ?Chrissie Noa, PhD ? ? ? ? ? ? ? ? ? ? ? ? ? ? ?Chrissie Noa, PhD ? ? ? ? ? ? ? ? ? ? ? ? ? ? ?Chrissie Noa, PhD ? ? ? ? ? ? ? ? ? ? ? ? ? ? ?Chrissie Noa, PhD ?

## 2021-09-12 ENCOUNTER — Other Ambulatory Visit (HOSPITAL_COMMUNITY): Payer: Self-pay

## 2021-09-13 ENCOUNTER — Other Ambulatory Visit (HOSPITAL_COMMUNITY): Payer: Self-pay

## 2021-09-14 ENCOUNTER — Other Ambulatory Visit (HOSPITAL_COMMUNITY): Payer: Self-pay

## 2021-09-17 ENCOUNTER — Other Ambulatory Visit (HOSPITAL_COMMUNITY): Payer: Self-pay

## 2021-09-18 ENCOUNTER — Other Ambulatory Visit (HOSPITAL_COMMUNITY): Payer: Self-pay

## 2021-09-18 ENCOUNTER — Ambulatory Visit (INDEPENDENT_AMBULATORY_CARE_PROVIDER_SITE_OTHER): Payer: No Typology Code available for payment source | Admitting: Clinical

## 2021-09-18 DIAGNOSIS — F840 Autistic disorder: Secondary | ICD-10-CM

## 2021-09-18 NOTE — Progress Notes (Signed)
Time: 11:04-11:56am CPT Code: 48546E-70 Diagnosis: F84.0  Jill Alexander was seen remotely using secure video conferencing. She was in her home and the therapist was in her office at the time of the appointment. She shared that she continues to do well overall, and had gone out socially with coworkers for the first time this past week. Session focused on headaches she has been experiencing near-daily. She queried whether this may be a side effect of medication, and therapist encouraged her to bring this up in her psychiatrist appointment on 6/15. Therapist also explored changes in her diet and behavior that may trigger headaches, and she indicated that the last time she experienced headaches this often she had needed to update her eye Rx. She has an appointment with the opthamologist next week. She is scheduled to be seen again in one week.      [facility] Counselor/Therapist Progress Note   Patient ID: [name], MRN: [id number],     Individualized Treatment Plan Strengths: Enjoys reading, Seeking counseling, In training program through [employer], "I like humor and jokes"  Supports: Her dog, her bestfriend since childhood, her parents, her sister    Goal/Needs for Treatment:  In order of importance to patient 1) Emotional Regulation/Anxiety 2) Improve Positive self talk      Client Statement of Needs: "Find ways of being nicer to myself", cope w/ anxiety and worry    Treatment Level:Outpatient Counseling  Symptoms:Anxiety, Perseverative worry, Negative self talk  Client Treatment Preferences:weekly counseling, return to Dr. Dewayne Hatch in Feb 2023 Prefers to be called Jill Alexander    Healthcare consumer's goal for treatment:   [name], [facility] will support the patient's ability to achieve the goals identified. Cognitive Behavioral Therapy, Supportive Counseling, Mindfulness and Relaxation Strategies and other evidenced-based practices will be used to promote progress towards  healthy functioning.    Healthcare consumer will: Actively participate in therapy, working towards healthy functioning.     *Justification for Continuation/Discontinuation of Goal: R=Revised, O=Ongoing, A=Achieved, D=Discontinued   Goal 1) Jill Alexander will develop strategies to regulate her emotions and improve her positive self talk Likert rating baseline date 04/12/21: How Easy - 3; How Often - 50% Target Date Goal Was reviewed Status Code Progress towards goal/Likert rating  04/12/22   New Maintained                      Jill Alexander participated in development of tx plan and provided verbal consent.      Chrissie Noa, PhD               Chrissie Noa, PhD

## 2021-09-19 ENCOUNTER — Other Ambulatory Visit (HOSPITAL_COMMUNITY): Payer: Self-pay

## 2021-09-25 ENCOUNTER — Ambulatory Visit (INDEPENDENT_AMBULATORY_CARE_PROVIDER_SITE_OTHER): Payer: No Typology Code available for payment source | Admitting: Clinical

## 2021-09-25 ENCOUNTER — Ambulatory Visit: Payer: No Typology Code available for payment source | Admitting: Neurology

## 2021-09-25 ENCOUNTER — Ambulatory Visit (INDEPENDENT_AMBULATORY_CARE_PROVIDER_SITE_OTHER): Payer: No Typology Code available for payment source | Admitting: Physician Assistant

## 2021-09-25 ENCOUNTER — Encounter: Payer: Self-pay | Admitting: Physician Assistant

## 2021-09-25 VITALS — BP 100/60 | HR 62 | Ht 62.0 in | Wt 131.2 lb

## 2021-09-25 DIAGNOSIS — R3915 Urgency of urination: Secondary | ICD-10-CM

## 2021-09-25 DIAGNOSIS — R5383 Other fatigue: Secondary | ICD-10-CM

## 2021-09-25 DIAGNOSIS — K219 Gastro-esophageal reflux disease without esophagitis: Secondary | ICD-10-CM

## 2021-09-25 DIAGNOSIS — R569 Unspecified convulsions: Secondary | ICD-10-CM

## 2021-09-25 DIAGNOSIS — R5381 Other malaise: Secondary | ICD-10-CM

## 2021-09-25 DIAGNOSIS — F840 Autistic disorder: Secondary | ICD-10-CM | POA: Diagnosis not present

## 2021-09-25 LAB — POCT URINALYSIS DIP (CLINITEK)
Bilirubin, UA: NEGATIVE
Blood, UA: NEGATIVE
Glucose, UA: NEGATIVE mg/dL
Ketones, POC UA: NEGATIVE mg/dL
Leukocytes, UA: NEGATIVE
Nitrite, UA: NEGATIVE
POC PROTEIN,UA: NEGATIVE
Spec Grav, UA: 1.015 (ref 1.010–1.025)
Urobilinogen, UA: 0.2 E.U./dL
pH, UA: 6.5 (ref 5.0–8.0)

## 2021-09-25 NOTE — Progress Notes (Signed)
Time: 11:04-11:56am CPT Code: 24268T-41 Diagnosis: F84.0  Jill Alexander was seen remotely using secure video conferencing. She was in her home and the therapist was in her office at the time of the appointment. She shared that she continues to do well overall, but has been concerned that a close friend has not contacted her in three weeks. Session began by processing this, and therapist encouraged her to explore alternate explanations for why this might be. She also shared considering leaving her current job in 6 months. Therapist suggested alllowing herself time to acclimate to the new situation before making big decisions. She is scheduled to be seen again in one week.    [facility] Counselor/Therapist Progress Note   Patient ID: [name], MRN: [id number],     Individualized Treatment Plan Strengths: Enjoys reading, Seeking counseling, In training program through [facility], "I like humor and jokes"  Supports: Her dog, her bestfriend since childhood, her parents, her sister    Goal/Needs for Treatment:  In order of importance to patient 1) Emotional Regulation/Anxiety 2) Improve Positive self talk      Client Statement of Needs: "Find ways of being nicer to myself", cope w/ anxiety and worry    Treatment Level:Outpatient Counseling  Symptoms:Anxiety, Perseverative worry, Negative self talk  Client Treatment Preferences:weekly counseling, return to Dr. Dewayne Hatch in Feb 2023 Prefers to be called Jill Alexander    Healthcare consumer's goal for treatment:   [name], [facility] will support the patient's ability to achieve the goals identified. Cognitive Behavioral Therapy, Supportive Counseling, Mindfulness and Relaxation Strategies and other evidenced-based practices will be used to promote progress towards healthy functioning.    Healthcare consumer will: Actively participate in therapy, working towards healthy functioning.     *Justification for Continuation/Discontinuation of  Goal: R=Revised, O=Ongoing, A=Achieved, D=Discontinued   Goal 1) Jill Alexander will develop strategies to regulate her emotions and improve her positive self talk Likert rating baseline date 04/12/21: How Easy - 3; How Often - 50% Target Date Goal Was reviewed Status Code Progress towards goal/Likert rating  04/12/22   New Maintained                      Jill Alexander participated in development of tx plan and provided verbal consent.           Chrissie Noa, PhD               Chrissie Noa, PhD

## 2021-09-25 NOTE — Progress Notes (Unsigned)
Acute Office Visit  Subjective:    Patient ID: Jill Alexander, female    DOB: Nov 13, 1995, 26 y.o.   MRN: [id number]  Chief Complaint  Patient presents with   Acute Visit    Pt states that she feels really tired and not able to focus. She is wondering if she is on too much medicine.    HPI Patient presents today with her Mom; Mom states she is a Marine scientist and usually doesn't come with patient to office visits, but is today because she is concerned that patient doesn't remember everything discussed when going to her office visits here and with Urology, Physical Therapy, Behavioral Health, Psyciatry, Therapist, and Neurology; patient fills all of her medicines at [facility]; PMH of anxiety and depression, autism spectrum disorder, bipolar 2 disorder, seizures, GERD, migraine headaches; Mom notices that patient has been fatigued, had low energy, has trouble focusing, and been "spacey", worse since Fall 2022; Mom wonders about medicine side effects and wonders if patient is on too many medicines that may be causing these symptoms. Also, patient continues to struggle with urinary urgency and frequency. 01/2021 went to [facility], was diagnosed with urinary urgency, urinary frequency, and nocturia; advised to go to pelvic floor physical therapy and advised to stop fluid intake 3 - 4 hours before bedtime and to limit bladder irritants like tea and coffee.  Outpatient Medications Prior to Visit  Medication Sig Dispense Refill   acetaminophen (TYLENOL) 500 MG tablet Take 500 mg by mouth every 6 (six) hours as needed.     alclomethasone (ACLOVATE) 0.05 % cream Apply to affected areas on face up to 2 times daily as needed for eczema 60 g 3   ARIPiprazole (ABILIFY) 2 MG tablet Take 0.5 tablets (1 mg total) by mouth at bedtime. 15 tablet 2   Docusate Sodium (COLACE PO) Take by mouth.     drospirenone-ethinyl estradiol (YAZ) 3-0.02 MG tablet Take 1 tablet by mouth  daily. 28 tablet 11   Erenumab-aooe (AIMOVIG) 140 MG/ML SOAJ Inject 140 mg into the skin every 30 (thirty) days. 1 mL 11   Famotidine (PEPCID PO) Take by mouth.     FLUoxetine (PROZAC) 10 MG capsule Take 3 capsules (30 mg total) by mouth daily. 90 capsule 1   FLUoxetine (PROZAC) 10 MG capsule Take 3 capsules (30 mg total) by mouth at bedtime for anxiety 90 capsule 2   Ibuprofen (MOTRIN PO) Take 200 mg by mouth as needed.     lamoTRIgine (LAMICTAL) 200 MG tablet Take 1 tablet (200 mg total) by mouth 2 (two) times daily for seizures and depression. 60 tablet 2   LORazepam (ATIVAN) 1 MG tablet Take 1 tablet by mouth twice a day as needed for anxiety 60 tablet 2   melatonin 1 MG TABS tablet Take 1 tablet (1 mg total) by mouth at bedtime as needed (insomnia). 30 tablet 0   Multiple Vitamins-Minerals (MULTIVITAMIN PO) Take 1 tablet by mouth daily.     omeprazole (PRILOSEC) 20 MG capsule Take 1 capsule (20 mg total) by mouth daily. 30 capsule 5   polyethylene glycol (MIRALAX) packet Take 17 g by mouth daily as needed. 14 each 0   rizatriptan (MAXALT) 10 MG tablet Take 1 tablet (10 mg total) by mouth as needed for migraine. May repeat in 2 hours if needed. 10 tablet 11   topiramate (TOPAMAX) 50 MG tablet Take 3 tablets (150 mg total) by mouth at bedtime. 270 tablet 1  lamoTRIgine (LAMICTAL) 200 MG tablet Take 1 tablet (200 mg total) by mouth 2 (two) times daily for seizures and depression 60 tablet 1   LORazepam (ATIVAN) 1 MG tablet Take 1 tablet (1 mg total) by mouth 2 (two) times daily as needed for anxiety 60 tablet 1   betamethasone dipropionate 0.05 % cream Apply 1 application topically 2 (two) times daily to affected areas on hands as needed for eczema(not to face,groin or underarms) (Patient not taking: Reported on 09/25/2021) 45 g 3   cephALEXin (KEFLEX) 500 MG capsule Take 1 capsule (500 mg total) by mouth 2 (two) times daily. (Patient not taking: Reported on 09/25/2021) 14 capsule 0   desonide  (DESOWEN) 0.05 % cream Apply 1 application topically to affected areas of face up to 2 times daily as needed for eczema (Patient not taking: Reported on 09/25/2021) 60 g 3   dicyclomine (BENTYL) 10 MG capsule Take 1 capsule (10 mg total) by mouth 4 (four) times daily before meals and at bedtime. (Patient not taking: Reported on 09/25/2021) 120 capsule 1   LORazepam (ATIVAN) 1 MG tablet Take 1 tablet (1 mg total) by mouth 2 (two) times daily as needed. 60 tablet 1   nortriptyline (PAMELOR) 10 MG capsule Take 3 capsules (30 mg total) by mouth at bedtime for sleep and headaches (Patient not taking: Reported on 09/25/2021) 90 capsule 1   nortriptyline (PAMELOR) 10 MG capsule Take 2 capsules (20 mg total) by mouth at bedtime for sleep and headaches (Patient not taking: Reported on 09/25/2021) 60 capsule 1   No facility-administered medications prior to visit.    Allergies  Allergen Reactions   Gluten Meal     Review of Systems  Constitutional:  Positive for fatigue. Negative for activity change and chills.  HENT:  Negative for congestion and voice change.   Eyes:  Negative for pain and redness.  Respiratory:  Negative for cough and wheezing.   Cardiovascular:  Negative for chest pain.  Gastrointestinal:  Negative for constipation, diarrhea, nausea and vomiting.  Endocrine: Negative for polyuria.  Genitourinary:  Positive for frequency and urgency. Negative for dysuria and hematuria.  Skin:  Negative for color change and rash.  Allergic/Immunologic: Negative for immunocompromised state.  Neurological:  Negative for dizziness.  Psychiatric/Behavioral:  Positive for decreased concentration. Negative for agitation.       Objective:    Physical Exam Vitals and nursing note reviewed.  Constitutional:      General: She is not in acute distress.    Appearance: Normal appearance. She is not ill-appearing.  HENT:     Head: Normocephalic and atraumatic.     Right Ear: External ear normal.     Left  Ear: External ear normal.     Nose: No congestion.  Eyes:     Extraocular Movements: Extraocular movements intact.     Conjunctiva/sclera: Conjunctivae normal.     Pupils: Pupils are equal, round, and reactive to light.  Cardiovascular:     Rate and Rhythm: Normal rate and regular rhythm.     Pulses: Normal pulses.     Heart sounds: Normal heart sounds.  Pulmonary:     Effort: Pulmonary effort is normal.     Breath sounds: Normal breath sounds. No wheezing.  Abdominal:     General: Bowel sounds are normal.     Palpations: Abdomen is soft.  Musculoskeletal:        General: Normal range of motion.     Cervical back: Normal range of  motion and neck supple.     Right lower leg: No edema.     Left lower leg: No edema.  Skin:    General: Skin is warm and dry.     Findings: No bruising.  Neurological:     General: No focal deficit present.     Mental Status: She is alert and oriented to person, place, and time.  Psychiatric:        Mood and Affect: Mood normal.        Behavior: Behavior normal.        Thought Content: Thought content normal.    BP 100/60   Pulse 62   Ht 5' 2"  (1.575 m)   Wt 131 lb 3.2 oz (59.5 kg)   SpO2 98%   BMI 24.00 kg/m   Wt Readings from Last 3 Encounters:  09/25/21 131 lb 3.2 oz (59.5 kg)  07/27/21 137 lb (62.1 kg)  06/21/21 140 lb (63.5 kg)    Results for orders placed or performed in visit on 09/25/21  CBC with Differential/Platelet  Result Value Ref Range   WBC 6.1 3.4 - 10.8 x10E3/uL   RBC 4.40 3.77 - 5.28 x10E6/uL   Hemoglobin 13.8 11.1 - 15.9 g/dL   Hematocrit 40.5 34.0 - 46.6 %   MCV 92 79 - 97 fL   MCH 31.4 26.6 - 33.0 pg   MCHC 34.1 31.5 - 35.7 g/dL   RDW 12.2 11.7 - 15.4 %   Platelets 264 150 - 450 x10E3/uL   Neutrophils 58 Not Estab. %   Lymphs 33 Not Estab. %   Monocytes 8 Not Estab. %   Eos 1 Not Estab. %   Basos 0 Not Estab. %   Neutrophils Absolute 3.5 1.4 - 7.0 x10E3/uL   Lymphocytes Absolute 2.0 0.7 - 3.1 x10E3/uL    Monocytes Absolute 0.5 0.1 - 0.9 x10E3/uL   EOS (ABSOLUTE) 0.1 0.0 - 0.4 x10E3/uL   Basophils Absolute 0.0 0.0 - 0.2 x10E3/uL   Immature Granulocytes 0 Not Estab. %   Immature Grans (Abs) 0.0 0.0 - 0.1 x10E3/uL  Comprehensive metabolic panel  Result Value Ref Range   Glucose 76 70 - 99 mg/dL   BUN 17 6 - 20 mg/dL   Creatinine, Ser 0.85 0.57 - 1.00 mg/dL   eGFR 96 >59 mL/min/1.73   BUN/Creatinine Ratio 20 9 - 23   Sodium 139 134 - 144 mmol/L   Potassium 4.1 3.5 - 5.2 mmol/L   Chloride 107 (H) 96 - 106 mmol/L   CO2 19 (L) 20 - 29 mmol/L   Calcium 9.3 8.7 - 10.2 mg/dL   Total Protein 6.5 6.0 - 8.5 g/dL   Albumin 4.4 3.9 - 5.0 g/dL   Globulin, Total 2.1 1.5 - 4.5 g/dL   Albumin/Globulin Ratio 2.1 1.2 - 2.2   Bilirubin Total <0.2 0.0 - 1.2 mg/dL   Alkaline Phosphatase 51 44 - 121 IU/L   AST 15 0 - 40 IU/L   ALT 13 0 - 32 IU/L  Thyroid Panel With TSH  Result Value Ref Range   TSH 0.780 0.450 - 4.500 uIU/mL   T4, Total 9.9 4.5 - 12.0 ug/dL   T3 Uptake Ratio 20 (L) 24 - 39 %   Free Thyroxine Index 2.0 1.2 - 4.9  POCT URINALYSIS DIP (CLINITEK)  Result Value Ref Range   Color, UA yellow yellow   Clarity, UA clear clear   Glucose, UA negative negative mg/dL   Bilirubin, UA negative negative  Ketones, POC UA negative negative mg/dL   Spec Grav, UA 1.015 1.010 - 1.025   Blood, UA negative negative   pH, UA 6.5 5.0 - 8.0   POC PROTEIN,UA negative negative, trace   Urobilinogen, UA 0.2 0.2 or 1.0 E.U./dL   Nitrite, UA Negative Negative   Leukocytes, UA Negative Negative       Assessment & Plan:  1. Malaise and fatigue - CBC with Differential/Platelet - Comprehensive metabolic panel - POCT URINALYSIS DIP (CLINITEK) - Thyroid Panel With TSH - will check above labs  2. Seizures ([place]) - Comprehensive metabolic panel  3. Gastroesophageal reflux disease, unspecified whether esophagitis present - CBC with Differential/Platelet  4. Autism spectrum disorder - CBC with  Differential/Platelet - Comprehensive metabolic panel - POCT URINALYSIS DIP (CLINITEK) - Thyroid Panel With TSH  5. Urinary urgency - POCT URINALYSIS DIP (CLINITEK)    No orders of the defined types were placed in this encounter.   Return for Return as Already Scheduled.  Irene Pap, PA-C

## 2021-09-26 DIAGNOSIS — Z8669 Personal history of other diseases of the nervous system and sense organs: Secondary | ICD-10-CM | POA: Insufficient documentation

## 2021-09-26 DIAGNOSIS — R3915 Urgency of urination: Secondary | ICD-10-CM | POA: Insufficient documentation

## 2021-09-26 LAB — COMPREHENSIVE METABOLIC PANEL
ALT: 13 IU/L (ref 0–32)
AST: 15 IU/L (ref 0–40)
Albumin/Globulin Ratio: 2.1 (ref 1.2–2.2)
Albumin: 4.4 g/dL (ref 3.9–5.0)
Alkaline Phosphatase: 51 IU/L (ref 44–121)
BUN/Creatinine Ratio: 20 (ref 9–23)
BUN: 17 mg/dL (ref 6–20)
Bilirubin Total: <0.2 mg/dL (ref 0.0–1.2)
CO2: 19 mmol/L — ABNORMAL LOW (ref 20–29)
Calcium: 9.3 mg/dL (ref 8.7–10.2)
Chloride: 107 mmol/L — ABNORMAL HIGH (ref 96–106)
Creatinine, Ser: 0.85 mg/dL (ref 0.57–1.00)
Globulin, Total: 2.1 g/dL (ref 1.5–4.5)
Glucose: 76 mg/dL (ref 70–99)
Potassium: 4.1 mmol/L (ref 3.5–5.2)
Sodium: 139 mmol/L (ref 134–144)
Total Protein: 6.5 g/dL (ref 6.0–8.5)
eGFR: 96 mL/min/{1.73_m2} (ref >59)

## 2021-09-26 LAB — CBC WITH DIFFERENTIAL/PLATELET
Basophils Absolute: 0 10*3/uL (ref 0.0–0.2)
Basos: 0 %
EOS (ABSOLUTE): 0.1 10*3/uL (ref 0.0–0.4)
Eos: 1 %
Hematocrit: 40.5 % (ref 34.0–46.6)
Hemoglobin: 13.8 g/dL (ref 11.1–15.9)
Immature Grans (Abs): 0 10*3/uL (ref 0.0–0.1)
Immature Granulocytes: 0 %
Lymphocytes Absolute: 2 10*3/uL (ref 0.7–3.1)
Lymphs: 33 %
MCH: 31.4 pg (ref 26.6–33.0)
MCHC: 34.1 g/dL (ref 31.5–35.7)
MCV: 92 fL (ref 79–97)
Monocytes Absolute: 0.5 10*3/uL (ref 0.1–0.9)
Monocytes: 8 %
Neutrophils Absolute: 3.5 10*3/uL (ref 1.4–7.0)
Neutrophils: 58 %
Platelets: 264 10*3/uL (ref 150–450)
RBC: 4.4 x10E6/uL (ref 3.77–5.28)
RDW: 12.2 % (ref 11.7–15.4)
WBC: 6.1 10*3/uL (ref 3.4–10.8)

## 2021-09-26 LAB — THYROID PANEL WITH TSH
Free Thyroxine Index: 2 (ref 1.2–4.9)
T3 Uptake Ratio: 20 % — ABNORMAL LOW (ref 24–39)
T4, Total: 9.9 ug/dL (ref 4.5–12.0)
TSH: 0.78 u[IU]/mL (ref 0.450–4.500)

## 2021-09-26 NOTE — Assessment & Plan Note (Signed)
Urinalysis negative, continue pelvic floor physical therapy and follow up with Urology prn

## 2021-09-26 NOTE — Assessment & Plan Note (Signed)
Stable, continue follow up with behavioral health

## 2021-09-26 NOTE — Assessment & Plan Note (Signed)
stable,  avoid stomach irritants (tomatoes, oranges, lemons, limes, spicy/greasy food)  

## 2021-09-26 NOTE — Assessment & Plan Note (Signed)
Stable, continue follow up with Neurology

## 2021-10-02 ENCOUNTER — Ambulatory Visit (INDEPENDENT_AMBULATORY_CARE_PROVIDER_SITE_OTHER): Payer: No Typology Code available for payment source | Admitting: Clinical

## 2021-10-02 DIAGNOSIS — F840 Autistic disorder: Secondary | ICD-10-CM

## 2021-10-02 NOTE — Progress Notes (Signed)
Time: 11:04-11:56am CPT Code: 88916X-45 Diagnosis: F84.0  Jill Alexander was seen remotely using secure video conferencing. She was in her home and the therapist was in her home at the time of the appointment. She shared that, despite several ostensibly positive developments in her life, she has continued to feel depressed, and this has triggered secondary feelings of guilt. Therapist provided validation and support, reinforcing that a depressed mood is not her fault. She reflected upon developments at work and in her interactions with her mother. Therapist encouraged her to try pausing before she speaks to evaluate whether it is emotion mind talking, or something she really wants to communicate. She is scheduled to be seen again in one week.      [facility] Counselor/Therapist Progress Note   Patient ID: [name], MRN: [id number],     Individualized Treatment Plan Strengths: Enjoys reading, Seeking counseling, In training program through [employer], "I like humor and jokes"  Supports: Her dog, her bestfriend since childhood, her parents, her sister    Goal/Needs for Treatment:  In order of importance to patient 1) Emotional Regulation/Anxiety 2) Improve Positive self talk      Client Statement of Needs: "Find ways of being nicer to myself", cope w/ anxiety and worry    Treatment Level:Outpatient Counseling  Symptoms:Anxiety, Perseverative worry, Negative self talk  Client Treatment Preferences:weekly counseling, return to Dr. Dewayne Hatch in Feb 2023 Prefers to be called Jill Alexander    Healthcare consumer's goal for treatment:   [name], [facility] will support the patient's ability to achieve the goals identified. Cognitive Behavioral Therapy, Supportive Counseling, Mindfulness and Relaxation Strategies and other evidenced-based practices will be used to promote progress towards healthy functioning.    Healthcare consumer will: Actively participate in therapy, working towards  healthy functioning.     *Justification for Continuation/Discontinuation of Goal: R=Revised, O=Ongoing, A=Achieved, D=Discontinued   Goal 1) Jill Alexander will develop strategies to regulate her emotions and improve her positive self talk Likert rating baseline date 04/12/21: How Easy - 3; How Often - 50% Target Date Goal Was reviewed Status Code Progress towards goal/Likert rating  04/12/22   New Maintained                      Jill Alexander participated in development of tx plan and provided verbal consent.       Chrissie Noa, PhD               Chrissie Noa, PhD

## 2021-10-03 ENCOUNTER — Other Ambulatory Visit (HOSPITAL_COMMUNITY): Payer: Self-pay

## 2021-10-09 ENCOUNTER — Encounter: Payer: Self-pay | Admitting: Neurology

## 2021-10-09 ENCOUNTER — Ambulatory Visit (INDEPENDENT_AMBULATORY_CARE_PROVIDER_SITE_OTHER): Payer: No Typology Code available for payment source | Admitting: Clinical

## 2021-10-09 ENCOUNTER — Other Ambulatory Visit (HOSPITAL_COMMUNITY): Payer: Self-pay

## 2021-10-09 DIAGNOSIS — F840 Autistic disorder: Secondary | ICD-10-CM

## 2021-10-09 NOTE — Progress Notes (Signed)
Time: 11:04-11:56am CPT Code: 71062I-94 Diagnosis: F84.0  Jill Alexander was seen remotely using secure video conferencing. She was in her home and the therapist was in her office at the time of the appointment. She reflected upon feelings of sadness related to a sense that she and her parents mutually need space from each other, and expressed interest in moving out. Therapist reframed this as a step forward developmentally, and Jill Alexander expressed interest in reaching out to several coworkers to see if they are interested in spending time with her. She is scheduled to be seen again in one week.      [facility] Counselor/Therapist Progress Note   Patient ID: Jill Alexander, MRN: [id number],     Individualized Treatment Plan Strengths: Enjoys reading, Seeking counseling, In training program through [employer], "I like humor and jokes"  Supports: Her dog, her bestfriend since childhood, her parents, her sister    Goal/Needs for Treatment:  In order of importance to patient 1) Emotional Regulation/Anxiety 2) Improve Positive self talk      Client Statement of Needs: "Find ways of being nicer to myself", cope w/ anxiety and worry    Treatment Level:Outpatient Counseling  Symptoms:Anxiety, Perseverative worry, Negative self talk  Client Treatment Preferences:weekly counseling, return to Dr. Dewayne Hatch in Feb 2023 Prefers to be called Jill Alexander    Healthcare consumer's goal for treatment:   [name], [facility] will support the patient's ability to achieve the goals identified. Cognitive Behavioral Therapy, Supportive Counseling, Mindfulness and Relaxation Strategies and other evidenced-based practices will be used to promote progress towards healthy functioning.    Healthcare consumer will: Actively participate in therapy, working towards healthy functioning.     *Justification for Continuation/Discontinuation of Goal: R=Revised, O=Ongoing, A=Achieved, D=Discontinued   Goal 1) Jill Alexander will  develop strategies to regulate her emotions and improve her positive self talk Likert rating baseline date 04/12/21: How Easy - 3; How Often - 50% Target Date Goal Was reviewed Status Code Progress towards goal/Likert rating  04/12/22   New Maintained                      Jill Alexander participated in development of tx plan and provided verbal consent.       Chrissie Noa, PhD               Chrissie Noa, PhD               Chrissie Noa, PhD

## 2021-10-10 ENCOUNTER — Encounter: Payer: Self-pay | Admitting: Physician Assistant

## 2021-10-16 ENCOUNTER — Ambulatory Visit (INDEPENDENT_AMBULATORY_CARE_PROVIDER_SITE_OTHER): Payer: No Typology Code available for payment source | Admitting: Clinical

## 2021-10-16 DIAGNOSIS — F840 Autistic disorder: Secondary | ICD-10-CM | POA: Diagnosis not present

## 2021-10-16 NOTE — Progress Notes (Signed)
Time: 11:04-11:56am CPT Code: 16109U-04 Diagnosis: F84.0  Jill Alexander was seen remotely using secure video conferencing. She was in her home and the therapist was in her home at the time of the appointment. Session focused on boundaries with her parents. She reported progress but shared concern that she had gone too far in the other direction, and had been bottliing up feelings. Therapist suggested letting her parents know when she has something emotional to discuss and asking to schedule a good time to talk. She is scheduled to be seen again in one week.     [facility] Counselor/Therapist Progress Note   Patient ID: Jill Alexander, MRN: [id number],     Individualized Treatment Plan Strengths: Enjoys reading, Seeking counseling, In training program through [facility], "I like humor and jokes"  Supports: Her dog, her bestfriend since childhood, her parents, her sister    Goal/Needs for Treatment:  In order of importance to patient 1) Emotional Regulation/Anxiety 2) Improve Positive self talk      Client Statement of Needs: "Find ways of being nicer to myself", cope w/ anxiety and worry    Treatment Level:Outpatient Counseling  Symptoms:Anxiety, Perseverative worry, Negative self talk  Client Treatment Preferences:weekly counseling, return to Dr. Dewayne Hatch in Feb 2023 Prefers to be called Jill Alexander    Healthcare consumer's goal for treatment:   [name], [facility] will support the patient's ability to achieve the goals identified. Cognitive Behavioral Therapy, Supportive Counseling, Mindfulness and Relaxation Strategies and other evidenced-based practices will be used to promote progress towards healthy functioning.    Healthcare consumer will: Actively participate in therapy, working towards healthy functioning.     *Justification for Continuation/Discontinuation of Goal: R=Revised, O=Ongoing, A=Achieved, D=Discontinued   Goal 1) Jill Alexander will develop strategies to regulate her  emotions and improve her positive self talk Likert rating baseline date 04/12/21: How Easy - 3; How Often - 50% Target Date Goal Was reviewed Status Code Progress towards goal/Likert rating  04/12/22   New Maintained                      Jill Alexander participated in development of tx plan and provided verbal consent.          Chrissie Noa, PhD               Chrissie Noa, PhD

## 2021-10-18 ENCOUNTER — Ambulatory Visit (HOSPITAL_COMMUNITY): Payer: No Typology Code available for payment source | Admitting: Psychiatry

## 2021-10-18 ENCOUNTER — Other Ambulatory Visit (HOSPITAL_COMMUNITY): Payer: Self-pay

## 2021-10-19 ENCOUNTER — Other Ambulatory Visit (HOSPITAL_COMMUNITY): Payer: Self-pay

## 2021-10-21 ENCOUNTER — Telehealth: Payer: No Typology Code available for payment source | Admitting: Physician Assistant

## 2021-10-21 DIAGNOSIS — R3989 Other symptoms and signs involving the genitourinary system: Secondary | ICD-10-CM | POA: Diagnosis not present

## 2021-10-22 ENCOUNTER — Ambulatory Visit (HOSPITAL_COMMUNITY): Payer: No Typology Code available for payment source | Admitting: Psychiatry

## 2021-10-22 ENCOUNTER — Other Ambulatory Visit (HOSPITAL_COMMUNITY): Payer: Self-pay

## 2021-10-22 MED ORDER — CEPHALEXIN 500 MG PO CAPS
500.0000 mg | ORAL_CAPSULE | Freq: Two times a day (BID) | ORAL | 0 refills | Status: DC
  Filled 2021-10-22: qty 14, 7d supply, fill #0

## 2021-10-23 ENCOUNTER — Encounter: Payer: Self-pay | Admitting: Physician Assistant

## 2021-10-23 ENCOUNTER — Ambulatory Visit: Payer: No Typology Code available for payment source | Admitting: Physician Assistant

## 2021-10-23 ENCOUNTER — Ambulatory Visit (INDEPENDENT_AMBULATORY_CARE_PROVIDER_SITE_OTHER): Payer: No Typology Code available for payment source | Admitting: Clinical

## 2021-10-23 VITALS — BP 110/60 | HR 69 | Ht 62.0 in | Wt 128.4 lb

## 2021-10-23 DIAGNOSIS — R5383 Other fatigue: Secondary | ICD-10-CM

## 2021-10-23 DIAGNOSIS — R5381 Other malaise: Secondary | ICD-10-CM

## 2021-10-23 DIAGNOSIS — F840 Autistic disorder: Secondary | ICD-10-CM | POA: Diagnosis not present

## 2021-10-23 DIAGNOSIS — G478 Other sleep disorders: Secondary | ICD-10-CM | POA: Diagnosis not present

## 2021-10-23 DIAGNOSIS — G4719 Other hypersomnia: Secondary | ICD-10-CM | POA: Diagnosis not present

## 2021-10-23 DIAGNOSIS — R519 Headache, unspecified: Secondary | ICD-10-CM

## 2021-10-23 DIAGNOSIS — R0683 Snoring: Secondary | ICD-10-CM | POA: Diagnosis not present

## 2021-10-26 ENCOUNTER — Encounter: Payer: Self-pay | Admitting: Internal Medicine

## 2021-10-26 ENCOUNTER — Ambulatory Visit: Payer: No Typology Code available for payment source | Admitting: Obstetrics and Gynecology

## 2021-10-31 ENCOUNTER — Other Ambulatory Visit (HOSPITAL_COMMUNITY): Payer: Self-pay

## 2021-11-06 ENCOUNTER — Ambulatory Visit (INDEPENDENT_AMBULATORY_CARE_PROVIDER_SITE_OTHER): Payer: No Typology Code available for payment source | Admitting: Clinical

## 2021-11-06 DIAGNOSIS — F840 Autistic disorder: Secondary | ICD-10-CM

## 2021-11-06 NOTE — Progress Notes (Signed)
  Time: 11:01-11:58am CPT Code: [id number]-74 Diagnosis: F84.0  Jill Alexander was seen remotely using secure video conferencing. She was in her home and the therapist was in her home at the time of the appointment. She again presented as upset and reported that she continues to experience a depressive episode. She reported upon a scheduling conflict that had arisen, and therapist engaged her in a discussion of possible solutions. She is scheduled to be seen again in one week.     [facility] Counselor/Therapist Progress Note   Patient ID: Jill Alexander, MRN: [id number],     Individualized Treatment Plan Strengths: Enjoys reading, Seeking counseling, In training program through [employer], "I like humor and jokes"  Supports: Her dog, her bestfriend since childhood, her parents, her sister    Goal/Needs for Treatment:  In order of importance to patient 1) Emotional Regulation/Anxiety 2) Improve Positive self talk      Client Statement of Needs: "Find ways of being nicer to myself", cope w/ anxiety and worry    Treatment Level:Outpatient Counseling  Symptoms:Anxiety, Perseverative worry, Negative self talk  Client Treatment Preferences:weekly counseling, return to Dr. Dewayne Hatch in Feb 2023 Prefers to be called Jill Alexander    Healthcare consumer's goal for treatment:   [name], [facility] will support the patient's ability to achieve the goals identified. Cognitive Behavioral Therapy, Supportive Counseling, Mindfulness and Relaxation Strategies and other evidenced-based practices will be used to promote progress towards healthy functioning.    Healthcare consumer will: Actively participate in therapy, working towards healthy functioning.     *Justification for Continuation/Discontinuation of Goal: R=Revised, O=Ongoing, A=Achieved, D=Discontinued   Goal 1) Jill Alexander will develop strategies to regulate her emotions and improve her positive self talk Likert rating baseline date 04/12/21: How  Easy - 3; How Often - 50% Target Date Goal Was reviewed Status Code Progress towards goal/Likert rating  04/12/22   New Maintained                      Jill Alexander participated in development of tx plan and provided verbal consent.          Chrissie Noa, PhD               Chrissie Noa, PhD

## 2021-11-08 ENCOUNTER — Other Ambulatory Visit (HOSPITAL_COMMUNITY): Payer: Self-pay

## 2021-11-08 ENCOUNTER — Encounter (HOSPITAL_COMMUNITY): Payer: Self-pay | Admitting: Psychiatry

## 2021-11-08 ENCOUNTER — Ambulatory Visit (HOSPITAL_BASED_OUTPATIENT_CLINIC_OR_DEPARTMENT_OTHER): Payer: No Typology Code available for payment source | Admitting: Psychiatry

## 2021-11-08 DIAGNOSIS — F331 Major depressive disorder, recurrent, moderate: Secondary | ICD-10-CM | POA: Diagnosis not present

## 2021-11-08 DIAGNOSIS — F411 Generalized anxiety disorder: Secondary | ICD-10-CM

## 2021-11-08 DIAGNOSIS — F410 Panic disorder [episodic paroxysmal anxiety] without agoraphobia: Secondary | ICD-10-CM | POA: Diagnosis not present

## 2021-11-08 DIAGNOSIS — F840 Autistic disorder: Secondary | ICD-10-CM | POA: Diagnosis not present

## 2021-11-08 MED ORDER — LORAZEPAM 1 MG PO TABS
1.0000 mg | ORAL_TABLET | Freq: Two times a day (BID) | ORAL | 0 refills | Status: DC | PRN
  Filled 2021-11-08: qty 60, 30d supply, fill #0

## 2021-11-08 MED ORDER — FLUOXETINE HCL 20 MG PO CAPS
60.0000 mg | ORAL_CAPSULE | Freq: Every day | ORAL | 0 refills | Status: DC
  Filled 2021-11-08 (×2): qty 270, 90d supply, fill #0

## 2021-11-08 MED ORDER — ARIPIPRAZOLE 2 MG PO TABS
1.0000 mg | ORAL_TABLET | Freq: Every day | ORAL | 0 refills | Status: DC
  Filled 2021-11-08 – 2021-11-21 (×2): qty 45, 90d supply, fill #0

## 2021-11-08 NOTE — Progress Notes (Signed)
Virtual Visit via Video Note  I connected with Jill Alexander on 11/08/21 by a video enabled telemedicine application and verified that I am speaking with the correct person using two identifiers.  Location: Patient: outside building Provider: office   I discussed the limitations of evaluation and management by telemedicine and the availability of in person appointments. The patient expressed understanding and agreed to proceed.    Psychiatric Initial Adult Assessment   Patient Identification: Jill Alexander MRN:  [id number] Date of Evaluation:  11/08/2021 Referral Source: self  Chief Complaint:   Chief Complaint  Patient presents with   Establish Care   Depression   Visit Diagnosis:    ICD-10-CM   1. Moderate episode of recurrent major depressive disorder (HCC)  F33.1     2. Panic attacks  F41.0     3. GAD (generalized anxiety disorder)  F41.1     4. Autism spectrum disorder  F84.0       History of Present Illness:  Jill Alexander is here to re-establish care. I last met with her in August 2021. She switched treatment to [facility] and was doing virtual appointments due to them accepting her insurance. She decided to come back to [facility] because she didn't feel she was connecting well with her psychiatrist at [facility].   Today she shares that her depression is getting worse. It has been slowly getting worse over the last couple of months. She is careful to say that it is not the worst it has ever been. She is not spending all her time in bed. She is feeling more irritable, agitated, hopeless and pessimistic and apathetic at times. She is having decreased motivation and is more lethargic. Her sleep is always poor. She can't recall the last time she woke up feeling well rested. She wants to get a sleep study done and was referred by  her PCP.  She has poor appetite. 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 Jill Alexander is experiencing more  anhedonia lately. She is connecting with people and making more friends that are her age.  She denies SI/HI.   Jill Alexander has stress induced panic attack a couple of times a week. She is always worried about something and her mom tells her "you worry about worry".   Associated Signs/Symptoms: Depression Symptoms:  depressed mood, anhedonia, fatigue, difficulty concentrating, hopelessness,  (Hypo) Manic Symptoms:  Pt denies recent manic and hypomanic symptoms including periods of decreased need for sleep, increased energy, mood lability, impulsivity, FOI, and excessive spending.  Anxiety Symptoms:  Excessive Worry, Panic Symptoms,  Psychotic Symptoms:   denies ideas of reference, paranoia and visual hallucinations. She has occasional AH a couple of days a week. She is thinks it is due to poor sleep or stress.   PTSD Symptoms: Denies trauma and abuse  Past Psychiatric History:  Dx: Mood disorder NOS, Bipolar 2 disorder- denies, OCD, Autism Meds:Abilify, Wellbutrin, Lamictal, Ativan, Prozac, Nortriptyline, Xanax,Valium, Cariprazine, Lithium Previous psychiatrist/therapist: [facility] from 2019-2021, [facility], In [place] was with 1 psychiatrist for 10 years Other treatments:  DBT, residential treatment facility Aug-Oct 2020, PHP 10/2018, IOP 10/2018 Hospitalizations:denies SIB: denies Suicide attempts: denies Hx of violent behavior towards others: denies  Current access to guns: denies  Hx of abuse: denies Military Hx: denies Hx of Seizures: yes Hx of TBI: denies   Previous Psychotropic Medications: Yes   Substance Abuse History in the last 12 months:  No. Jill Alexander denies nicotine use, excessive alcohol use and illicit drug use.  Consequences of Substance Abuse:denies   Past Medical History:  Past Medical History:  Diagnosis Date   Anxiety    Autism    Chronic nausea    Common migraine with intractable migraine 07/01/2019   Depression    Dyspepsia    GERD (gastroesophageal reflux  disease)    Headache    Heart murmur    Seizures (HCC)    Tremor, 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    Past Surgical History:  Procedure Laterality Date   none      Family Psychiatric and Medical History:  Family History  Problem Relation Age of Onset   Depression Father    Depression Sister    Anxiety disorder Sister    Diabetes Maternal Grandfather    Cancer Maternal Grandfather    Breast cancer Paternal Grandmother    Uterine cancer Paternal Grandmother    Irritable bowel syndrome Paternal Grandmother    Colon cancer Neg Hx    Esophageal cancer Neg Hx    Stomach cancer Neg Hx    Rectal cancer Neg Hx     Social History:   Social History   Socioeconomic History   Marital status: Single    Spouse name: Not on file   Number of children: 0   Years of education: Not on file   Highest education level: Some college, no degree  Occupational History   Not on file  Tobacco Use   Smoking status: Never   Smokeless tobacco: Never  Vaping Use   Vaping Use: Never used  Substance and Sexual Activity   Alcohol use: Yes    Comment: 1 cocktail a few times a year   Drug use: No   Sexual activity: Not Currently    Birth control/protection: Pill  Other Topics Concern   Not on file  Social History Narrative   Lives with parents in [place] with her mom and dad. Pt moved here from [place] in 2018.      Siblings- Pt is the youngest of out 3 siblings. 34 sister 64 years older than her, 1 brother who is 17 years older than her.    Schooling- started college right after HS graduation and stopped after a year   Married- denies    Kids- denies    Legal issues- denies       Caffeine use: 2-3 cups per day   Left handed    Social Determinants of Health   Financial Resource Strain: Low Risk  (05/27/2018)   Overall Financial Resource Strain (CARDIA)    Difficulty of Paying Living Expenses: Not hard at all  Food Insecurity: No Food Insecurity (05/27/2018)   Hunger Vital Sign    Worried About  Running Out of Food in the Last Year: Never true    Ran Out of Food in the Last Year: Never true  Transportation Needs: Unmet Transportation Needs (06/29/2018)   PRAPARE - Transportation    Lack of Transportation (Medical): Yes    Lack of Transportation (Non-Medical): Yes  Physical Activity: Unknown (06/29/2018)   Exercise Vital Sign    Days of Exercise per Week: 3 days    Minutes of Exercise per Session: Not on file  Recent Concern: Physical Activity - Insufficiently Active (05/27/2018)   Exercise Vital Sign    Days of Exercise per Week: 2 days    Minutes of Exercise per Session: 30 min  Stress: Stress Concern Present (06/29/2018)   [place]    Feeling of Stress :  Rather much  Social Connections: Unknown (06/29/2018)   Social Connection and Isolation Panel [NHANES]    Frequency of Communication with Friends and Family: Not on file    Frequency of Social Gatherings with Friends and Family: Not on file    Attends Religious Services: Not on file    Active Member of Clubs or Organizations: Not on file    Attends Archivist Meetings: Not on file    Marital Status: Never married      Allergies:   Allergies  Allergen Reactions   Gluten Meal     Metabolic Disorder Labs: Lab Results  Component Value Date   HGBA1C 5.2 07/14/2019   No results found for: "PROLACTIN" No results found for: "CHOL", "TRIG", "HDL", "CHOLHDL", "VLDL", "LDLCALC" Lab Results  Component Value Date   TSH 0.780 09/25/2021    Therapeutic Level Labs: Lab Results  Component Value Date   LITHIUM 0.8 01/05/2018   No results found for: "CBMZ" No results found for: "VALPROATE"  Current Medications: Current Outpatient Medications  Medication Sig Dispense Refill   acetaminophen (TYLENOL) 500 MG tablet Take 500 mg by mouth every 6 (six) hours as needed.     ARIPiprazole (ABILIFY) 2 MG tablet Take 0.5 tablets (1 mg total) by mouth at  bedtime. 15 tablet 2   cephALEXin (KEFLEX) 500 MG capsule Take 1 capsule (500 mg total) by mouth 2 (two) times daily. 14 capsule 0   Docusate Sodium (COLACE PO) Take by mouth.     drospirenone-ethinyl estradiol (YAZ) 3-0.02 MG tablet Take 1 tablet by mouth daily. 28 tablet 11   Famotidine (PEPCID PO) Take by mouth.     FLUoxetine (PROZAC) 10 MG capsule Take 3 capsules (30 mg total) by mouth daily. 90 capsule 1   Ibuprofen (MOTRIN PO) Take 200 mg by mouth as needed.     lamoTRIgine (LAMICTAL) 200 MG tablet Take 1 tablet (200 mg total) by mouth 2 (two) times daily for seizures and depression. 60 tablet 2   LORazepam (ATIVAN) 1 MG tablet Take 1 tablet by mouth twice a day as needed for anxiety 60 tablet 2   melatonin 1 MG TABS tablet Take 1 tablet (1 mg total) by mouth at bedtime as needed (insomnia). 30 tablet 0   Multiple Vitamins-Minerals (MULTIVITAMIN PO) Take 1 tablet by mouth daily.     polyethylene glycol (MIRALAX) packet Take 17 g by mouth daily as needed. 14 each 0   rizatriptan (MAXALT) 10 MG tablet Take 1 tablet (10 mg total) by mouth as needed for migraine. May repeat in 2 hours if needed. 10 tablet 11   topiramate (TOPAMAX) 50 MG tablet Take 3 tablets (150 mg total) by mouth at bedtime. 270 tablet 1   Erenumab-aooe (AIMOVIG) 140 MG/ML SOAJ Inject 140 mg into the skin every 30 (thirty) days. (Patient not taking: Reported on 11/08/2021) 1 mL 11   FLUoxetine (PROZAC) 10 MG capsule Take 3 capsules (30 mg total) by mouth at bedtime for anxiety (Patient not taking: Reported on 11/08/2021) 90 capsule 2   omeprazole (PRILOSEC) 20 MG capsule Take 1 capsule (20 mg total) by mouth daily. (Patient not taking: Reported on 11/08/2021) 30 capsule 5   No current facility-administered medications for this visit.     Psychiatric Specialty Exam: Review of Systems  There were no vitals taken for this visit.There is no height or weight on file to calculate BMI.  General Appearance: Casual and Neat  Eye  Contact:  Fair  Speech:  Clear and Coherent and Slow  Volume:  Normal  Mood:  Depressed  Affect:  Blunt  Thought Process:  slow, concrete, Coherent and Descriptions of Associations: Circumstantial  Orientation:  Full (Time, Place, and Person)  Thought Content:  Logical  Suicidal Thoughts:  No  Homicidal Thoughts:  No  Memory:  Immediate;   Good  Judgement:  Fair  Insight:  Fair  Psychomotor Activity:  Normal  Concentration:  Concentration: Fair  Recall:  [place] of Knowledge:Fair  Language: Fair  Akathisia:  No  Handed:  Left  AIMS (if indicated):  not done  Assets:  Communication Skills Desire for [place] Talents/Skills Transportation Vocational/Educational  ADL's:  Intact  Cognition: WNL  Sleep:  Fair   Screenings: ECT-MADRS    [place] ECT Treatment from 07/31/2018 in [facility] Treatment from 07/13/2018 in [facility] Treatment from 07/06/2018 in [facility] Total Score 24 20 37      GAD-7    Flowsheet Row Counselor from 06/01/2018 in [facility] Counselor from 05/22/2018 in [facility] Office Visit from 01/15/2018 in [place] [facility] Office Visit from 12/17/2017 in [facility] Office Visit from 08/21/2017 in [facility]  Total GAD-7 Score _0 Mini-Mental    Flowsheet Row ECT Treatment from 07/31/2018 in [facility] ECT Treatment from 07/13/2018 in [facility] ECT Treatment from 07/06/2018 in [facility]  Total Score (max 30 points ) _1 PHQ2-9    [place] Visit from 11/08/2021 in [facility] Counselor  from 10/29/2018 in [place] Counselor from 06/01/2018 in [place] Counselor from 05/22/2018 in [place] Office Visit from 01/15/2018 in [place]  PHQ-2 Total Score _2 PHQ-9 Total Score _3 [place] Office Visit from 11/08/2021 in [facility] ED from 04/30/2021 in [facility] No Risk No Risk       Assessment and Plan:   Collaboration of Care: Referral or follow-up with counselor/therapist AEB    Patient/Guardian was advised Release of Information must be obtained prior to any record release in order to collaborate their care with an outside provider. Patient/Guardian was advised if they have not already done so to contact the registration department to sign all necessary forms in order for [place] to release information regarding their care.   Consent: Patient/Guardian gives verbal consent for treatment and assignment of benefits for services provided during this visit. Patient/Guardian expressed understanding and agreed to proceed.     Medication management with supportive therapy. Risks and benefits, side effects and alternative treatment options discussed with patient. Pt was given an opportunity to ask questions about medication, illness, and treatment. All current psychiatric medications have been reviewed and discussed with the patient and adjusted as clinically appropriate. The patient has been provided an accurate and updated list of the medications being now prescribed. Pt verbalized understanding and verbal consent obtained for treatment.  The risk of un-intended pregnancy is low based  on the fact that pt reports she is taking OCP and is not sexually active. Pt is aware that these meds carry a teratogenic risk. Pt will discuss plan of  action if she does or plans to become pregnant in the future.  Status of current problems: worsening depression  Meds: increase Prozac 82m po qD for MDD or anxiety  Topamax could be contributing to lethargy and decreased appetite.  Continue Abilify 124mpo qD Ativan 39m43mo BID prn anxiety and seizures She is on Lamictal and Topamax from her neurologist that could help her mood   Labs: reviewed labs done 09/25/21 Last EKG 06/2018. Will have her schedule another at our next visit.   Therapy: brief supportive therapy provided. Discussed psychosocial stressors in detail.   Consultations:  Encouraged to follow up with therapist- Dr. MenGaynell Facence/week  Encouraged to follow up with PCP as needed   Pt denies SI and is at an acute low risk for suicide. Patient told to call clinic if any problems occur. Patient advised to go to ER if they should develop SI/HI, side effects, or if symptoms worsen. Pt has crisis numbers to call if needed. Pt acknowledged and agreed with plan and verbalized understanding.  F/up in 1 months or sooner if needed  The duration of this appointment visit was 65 minutes of face-to-face time with the patient.  Greater than 50% of this time was spent in counseling, explanation of  diagnosis, planning of further management, and coordination of care    SalCharlcie CradleD 7/13/202311:18 AM

## 2021-11-10 ENCOUNTER — Encounter: Payer: No Typology Code available for payment source | Admitting: Family Medicine

## 2021-11-10 ENCOUNTER — Telehealth: Payer: No Typology Code available for payment source | Admitting: Family Medicine

## 2021-11-10 DIAGNOSIS — R3989 Other symptoms and signs involving the genitourinary system: Secondary | ICD-10-CM

## 2021-11-10 MED ORDER — NITROFURANTOIN MONOHYD MACRO 100 MG PO CAPS
100.0000 mg | ORAL_CAPSULE | Freq: Two times a day (BID) | ORAL | 0 refills | Status: AC
  Filled 2021-11-10: qty 10, 5d supply, fill #0

## 2021-11-10 NOTE — Progress Notes (Signed)
E-Visit for Urinary Problems  We are sorry that you are not feeling well.  Here is how we plan to help!  Based on what you shared with me it looks like you most likely have a simple urinary tract infection.  A UTI (Urinary Tract Infection) is a bacterial infection of the bladder.  Most cases of urinary tract infections are simple to treat but a key part of your care is to encourage you to drink plenty of fluids and watch your symptoms carefully. I have prescribed Macrobid 100 mg twice daily. Your symptoms should gradually improve. Call us if the burning in your urine worsens, you develop worsening fever, back pain or pelvic pain or if your symptoms do not resolve after completing the antibiotic.  Urinary tract infections can be prevented by drinking plenty of water to keep your body hydrated.  Also be sure when you wipe, wipe from front to back and don't hold it in!  If possible, empty your bladder every 4 hours.  HOME CARE Drink plenty of fluids Compete the full course of the antibiotics even if the symptoms resolve Remember, when you need to go.go. Holding in your urine can increase the likelihood of getting a UTI! GET HELP RIGHT AWAY IF: You cannot urinate You get a high fever Worsening back pain occurs You see blood in your urine You feel sick to your stomach or throw up You feel like you are going to pass out  MAKE SURE YOU  Understand these instructions. Will watch your condition. Will get help right away if you are not doing well or get worse.   Thank you for choosing an e-visit.  Your e-visit answers were reviewed by a board certified advanced clinical practitioner to complete your personal care plan. Depending upon the condition, your plan could have included both over the counter or prescription medications.  Please review your pharmacy choice. Make sure the pharmacy is open so you can pick up prescription now. If there is a problem, you may contact your provider through  [employer] and have the prescription routed to another pharmacy.  Your safety is important to [place]. If you have drug allergies check your prescription carefully.   For the next 24 hours you can use MyChart to ask questions about today's visit, request a non-urgent call back, or ask for a work or school excuse. You will get an email in the next two days asking about your experience. I hope that your e-visit has been valuable and will speed your recovery.   I have provided 5 minutes of non face to face time during this encounter for chart review and documentation.

## 2021-11-12 ENCOUNTER — Other Ambulatory Visit (HOSPITAL_COMMUNITY): Payer: Self-pay

## 2021-11-13 ENCOUNTER — Ambulatory Visit (INDEPENDENT_AMBULATORY_CARE_PROVIDER_SITE_OTHER): Payer: No Typology Code available for payment source | Admitting: Clinical

## 2021-11-13 DIAGNOSIS — F840 Autistic disorder: Secondary | ICD-10-CM | POA: Diagnosis not present

## 2021-11-13 NOTE — Progress Notes (Signed)
Time: 11:01-11:58am CPT Code: 38756E-33 Diagnosis: F84.0  Jill Alexander was seen remotely using secure video conferencing. She was in her home and the therapist was in her officee at the time of the appointment. She had reached out to the therapist over the weekend to request to talk about suicidal ideation and passive aggression. Therapist conducted a risk assessment, and she endorsed passive suicidal ideation (e.g., "what's the point?") without plan or intent. She had seen her psychiatrist and her dosage of fluoxetine had been doubled, but she has not yet filled her new prescription. She also shared that she sometimes responds to her parents more negatively than the situation warrants due to her own depressed mood, and therapist suggested a conversation during a calm moment where she clarified that when she does nt respond it is only because she is trying to regulate her own emotions so as not to respond in a rude manner. She is scheduled to be seen again in one week.     [facility] Counselor/Therapist Progress Note   Patient ID: Jill Alexander, MRN: [id number],     Individualized Treatment Plan Strengths: Enjoys reading, Seeking counseling, In training program through [employer], "I like humor and jokes"  Supports: Her dog, her bestfriend since childhood, her parents, her sister    Goal/Needs for Treatment:  In order of importance to patient 1) Emotional Regulation/Anxiety 2) Improve Positive self talk      Client Statement of Needs: "Find ways of being nicer to myself", cope w/ anxiety and worry    Treatment Level:Outpatient Counseling  Symptoms:Anxiety, Perseverative worry, Negative self talk  Client Treatment Preferences:weekly counseling, return to Dr. Dewayne Hatch in Feb 2023 Prefers to be called Jill Alexander    Healthcare consumer's goal for treatment:   [name], [facility] will support the patient's ability to achieve the goals identified. Cognitive Behavioral Therapy,  Supportive Counseling, Mindfulness and Relaxation Strategies and other evidenced-based practices will be used to promote progress towards healthy functioning.    Healthcare consumer will: Actively participate in therapy, working towards healthy functioning.     *Justification for Continuation/Discontinuation of Goal: R=Revised, O=Ongoing, A=Achieved, D=Discontinued   Goal 1) Jill Alexander will develop strategies to regulate her emotions and improve her positive self talk Likert rating baseline date 04/12/21: How Easy - 3; How Often - 50% Target Date Goal Was reviewed Status Code Progress towards goal/Likert rating  04/12/22   New Maintained                      Jill Alexander participated in development of tx plan and provided verbal consent.             Chrissie Noa, PhD               Chrissie Noa, PhD

## 2021-11-17 NOTE — Progress Notes (Signed)
erroneous

## 2021-11-19 ENCOUNTER — Other Ambulatory Visit (HOSPITAL_COMMUNITY): Payer: Self-pay

## 2021-11-20 ENCOUNTER — Ambulatory Visit (INDEPENDENT_AMBULATORY_CARE_PROVIDER_SITE_OTHER): Payer: No Typology Code available for payment source | Admitting: Clinical

## 2021-11-20 ENCOUNTER — Encounter: Payer: Self-pay | Admitting: Obstetrics and Gynecology

## 2021-11-20 ENCOUNTER — Ambulatory Visit: Payer: No Typology Code available for payment source | Admitting: Obstetrics and Gynecology

## 2021-11-20 VITALS — BP 104/68 | HR 51 | Ht 62.0 in | Wt 125.0 lb

## 2021-11-20 DIAGNOSIS — F840 Autistic disorder: Secondary | ICD-10-CM

## 2021-11-20 DIAGNOSIS — Z3041 Encounter for surveillance of contraceptive pills: Secondary | ICD-10-CM

## 2021-11-20 NOTE — Progress Notes (Unsigned)
GYNECOLOGY ENCOUNTER NOTE  History:     Jill Alexander is a 26 y.o. G0P0000 female here for follow after recently switching her BC pill to Yaz as she was experiencing PPMD symptoms while taking taytulla. She continues to have menstrual like symptoms including menstrual cramping 1x per month. She continues to have suppressed periods while on the pill. She would like to continue suppressing period as this is helping control her PMS symptoms. In the past she had very heavy, painful periods. She reports occasionally missing a pill; 1-2 times per week she is missing a pill. She is trying to come up with a better strategy to remember to take the pill. She is not sexually active.    Gynecologic History No LMP recorded. (Menstrual status: Oral contraceptives). Contraception: OCP (estrogen/progesterone)   Obstetric History OB History  Gravida Para Term Preterm AB Living  0 0 0 0 0 0  SAB IAB Ectopic Multiple Live Births  0 0 0 0 0    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    Past Surgical History:  Procedure Laterality Date   none      Current Outpatient Medications on File Prior to Visit  Medication Sig Dispense Refill   acetaminophen (TYLENOL) 500 MG tablet Take 500 mg by mouth every 6 (six) hours as needed.     ARIPiprazole (ABILIFY) 2 MG tablet Take 0.5 tablets (1 mg total) by mouth at bedtime. 45 tablet 0   cephALEXin (KEFLEX) 500 MG capsule Take 1 capsule (500 mg total) by mouth 2 (two) times daily. 14 capsule 0   Docusate Sodium (COLACE PO) Take by mouth.     drospirenone-ethinyl estradiol (YAZ) 3-0.02 MG tablet Take 1 tablet by mouth daily. 28 tablet 11   Famotidine (PEPCID PO) Take by mouth.     Ibuprofen (MOTRIN PO) Take 200  mg by mouth as needed.     lamoTRIgine (LAMICTAL) 200 MG tablet Take 1 tablet (200 mg total) by mouth 2 (two) times daily for seizures and depression. 60 tablet 2   LORazepam (ATIVAN) 1 MG tablet Take 1 tablet (1 mg total) by mouth 2 (two) times daily as needed for anxiety. 60 tablet 0   melatonin 1 MG TABS tablet Take 1 tablet (1 mg total) by mouth at bedtime as needed (insomnia). 30 tablet 0   Multiple Vitamins-Minerals (MULTIVITAMIN PO) Take 1 tablet by mouth daily.     omeprazole (PRILOSEC) 20 MG capsule Take 1 capsule (20 mg total) by mouth daily. 30 capsule 5   polyethylene glycol (MIRALAX) packet Take 17 g by mouth daily as needed. 14 each 0   rizatriptan (MAXALT) 10 MG tablet Take 1 tablet (10 mg total) by mouth as needed for migraine. May repeat in 2 hours if needed. 10 tablet 11   topiramate (TOPAMAX) 50 MG tablet Take 3 tablets (150 mg total) by mouth at bedtime. 270 tablet 1   Erenumab-aooe (AIMOVIG) 140 MG/ML SOAJ Inject 140 mg into the skin every 30 (thirty) days. (Patient not taking: Reported on 11/08/2021) 1 mL 11   FLUoxetine (PROZAC) 20 MG capsule Take 3 capsules (60 mg total) by mouth daily. (Patient not taking:  Reported on 11/20/2021) 270 capsule 0   [DISCONTINUED] Norethin Ace-Eth Estrad-FE (TAYTULLA) 1-20 MG-MCG(24) CAPS TAKE 1 CAPSULE BY MOUTH DAILY. 28 capsule 11   No current facility-administered medications on file prior to visit.    Allergies  Allergen Reactions   Gluten Meal     Social History:  reports that she has never smoked. She has never used smokeless tobacco. She reports current alcohol use. She reports that she does not use drugs.  Family History  Problem Relation Age of Onset   Depression Father    Depression Sister    Anxiety disorder Sister    Diabetes Maternal Grandfather    Cancer Maternal Grandfather    Breast cancer Paternal Grandmother    Uterine cancer Paternal Grandmother    Irritable bowel syndrome Paternal Grandmother    Colon cancer Neg  Hx    Esophageal cancer Neg Hx    Stomach cancer Neg Hx    Rectal cancer Neg Hx     The following portions of the patient's history were reviewed and updated as appropriate: allergies, current medications, past family history, past medical history, past social history, past surgical history and problem list.  Review of Systems Pertinent items noted in HPI and remainder of comprehensive ROS otherwise negative.  Physical Exam:  BP 104/68   Pulse (!) 51   Ht 5\' 2"  (1.575 m)   Wt 125 lb (56.7 kg)   BMI 22.86 kg/m  CONSTITUTIONAL: Well-developed, well-nourished female in no acute distress.  HENT:  Normocephalic EYES: Conjunctivae and EOM are normal.  NECK: Normal range of motion SKIN: Skin is warm and dry.  MUSCULOSKELETAL: Normal range of motion.  NEUROLOGIC: Alert and oriented to person, place, and time. Flatt affect.    Assessment and Plan:   1. Encounter for birth control pills maintenance  Doing well with Yaz.   Kinesha Auten, , NP [facility], [facility]

## 2021-11-20 NOTE — Progress Notes (Signed)
Time: 11:01-11:58am CPT Code: 61443X-54 Diagnosis: F84.0  Jill Alexander was seen remotely using secure video conferencing. She was in her home and the therapist was in her office at the time of the appointment. She had reached out to the therapist over the weekend to request to talk about her low self esteem infering with her ability to love others, as well as whether she may be a psychopath. Therapist engaged her in a discussion of these questions, assuring her that it is highly likely she would be a psychopath with the amount of empathy and concern for others she demonstrates. Jill Alexander demonstrated significant difficulty getting thoughts out and staying on topic, and reported that this challenge has increased since increasing her medication dosage. Therapist encouraged her to let her psychiatrist know. Next session will focus on creating a plan for while the therapist is out of the office. Jill Alexander is scheduled to be seen again in one week.     [facility] Counselor/Therapist Progress Note   Patient ID: Mirela Parsley, MRN: [id number],     Individualized Treatment Plan Strengths: Enjoys reading, Seeking counseling, In training program through [employer], "I like humor and jokes"  Supports: Her dog, her bestfriend since childhood, her parents, her sister    Goal/Needs for Treatment:  In order of importance to patient 1) Emotional Regulation/Anxiety 2) Improve Positive self talk      Client Statement of Needs: "Find ways of being nicer to myself", cope w/ anxiety and worry    Treatment Level:Outpatient Counseling  Symptoms:Anxiety, Perseverative worry, Negative self talk  Client Treatment Preferences:weekly counseling, return to Dr. Dewayne Hatch in Feb 2023 Prefers to be called Jill Alexander    Healthcare consumer's goal for treatment:   [name], [facility] will support the patient's ability to achieve the goals identified. Cognitive Behavioral Therapy, Supportive Counseling, Mindfulness and  Relaxation Strategies and other evidenced-based practices will be used to promote progress towards healthy functioning.    Healthcare consumer will: Actively participate in therapy, working towards healthy functioning.     *Justification for Continuation/Discontinuation of Goal: R=Revised, O=Ongoing, A=Achieved, D=Discontinued   Goal 1) Jill Alexander will develop strategies to regulate her emotions and improve her positive self talk Likert rating baseline date 04/12/21: How Easy - 3; How Often - 50% Target Date Goal Was reviewed Status Code Progress towards goal/Likert rating  04/12/22   New Maintained                      Jill Alexander participated in development of tx plan and provided verbal consent.        Chrissie Noa, PhD               Chrissie Noa, PhD

## 2021-11-21 ENCOUNTER — Other Ambulatory Visit (HOSPITAL_COMMUNITY): Payer: Self-pay

## 2021-11-22 ENCOUNTER — Ambulatory Visit (HOSPITAL_COMMUNITY): Payer: No Typology Code available for payment source | Admitting: Psychiatry

## 2021-11-22 DIAGNOSIS — Z3041 Encounter for surveillance of contraceptive pills: Secondary | ICD-10-CM | POA: Insufficient documentation

## 2021-11-27 ENCOUNTER — Ambulatory Visit (INDEPENDENT_AMBULATORY_CARE_PROVIDER_SITE_OTHER): Payer: No Typology Code available for payment source | Admitting: Clinical

## 2021-11-27 DIAGNOSIS — F840 Autistic disorder: Secondary | ICD-10-CM | POA: Diagnosis not present

## 2021-11-27 NOTE — Progress Notes (Signed)
Time: 11:01-11:58am CPT Code: 60630Z-60 Diagnosis: F84.0  Jill Alexander was seen remotely using secure video conferencing. She was in her home and the therapist was in her home at the time of the appointment. She shared that she continues to experience a depressed mood, and has come into conflict with her parents several times in the past week. She has increasingly considered moving out on her own. Therapist processed this with her. 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 Therapist engaged her in a discussion of upcoming sessions that will need to be canceled due to therapist being out of office, and Jill Alexander was able to move her 8/22 appt to 8/23 such that she will only miss one week. She is scheduled to be seen again in one week.     [facility] Counselor/Therapist Progress Note   Patient ID: [name], MRN: [id number],     Individualized Treatment Plan Strengths: Enjoys reading, Seeking counseling, In training program through [employer], "I like humor and jokes"  Supports: Her dog, her bestfriend since childhood, her parents, her sister    Goal/Needs for Treatment:  In order of importance to patient 1) Emotional Regulation/Anxiety 2) Improve Positive self talk      Client Statement of Needs: "Find ways of being nicer to myself", cope w/ anxiety and worry    Treatment Level:Outpatient Counseling  Symptoms:Anxiety, Perseverative worry, Negative self talk  Client Treatment Preferences:weekly counseling, return to Dr. Dewayne Hatch in Feb 2023 Prefers to be called Jill Alexander    Healthcare consumer's goal for treatment:   [name], [facility] will support the patient's ability to achieve the goals identified. Cognitive Behavioral Therapy, Supportive Counseling, Mindfulness and Relaxation Strategies and other evidenced-based practices will be used to promote progress towards healthy functioning.    Healthcare consumer will: Actively  participate in therapy, working towards healthy functioning.     *Justification for Continuation/Discontinuation of Goal: R=Revised, O=Ongoing, A=Achieved, D=Discontinued   Goal 1) Jill Alexander will develop strategies to regulate her emotions and improve her positive self talk Likert rating baseline date 04/12/21: How Easy - 3; How Often - 50% Target Date Goal Was reviewed Status Code Progress towards goal/Likert rating  04/12/22   New Maintained                      Jill Alexander participated in development of tx plan and provided verbal consent.         Chrissie Noa, PhD               Chrissie Noa, PhD

## 2021-12-04 ENCOUNTER — Ambulatory Visit: Payer: Self-pay | Admitting: Clinical

## 2021-12-10 ENCOUNTER — Other Ambulatory Visit (HOSPITAL_COMMUNITY): Payer: Self-pay | Admitting: Psychiatry

## 2021-12-10 ENCOUNTER — Other Ambulatory Visit (HOSPITAL_COMMUNITY): Payer: Self-pay

## 2021-12-10 DIAGNOSIS — F410 Panic disorder [episodic paroxysmal anxiety] without agoraphobia: Secondary | ICD-10-CM

## 2021-12-10 DIAGNOSIS — F411 Generalized anxiety disorder: Secondary | ICD-10-CM

## 2021-12-11 ENCOUNTER — Other Ambulatory Visit (HOSPITAL_COMMUNITY): Payer: Self-pay

## 2021-12-11 ENCOUNTER — Ambulatory Visit: Payer: No Typology Code available for payment source | Admitting: Clinical

## 2021-12-13 ENCOUNTER — Telehealth (HOSPITAL_BASED_OUTPATIENT_CLINIC_OR_DEPARTMENT_OTHER): Payer: No Typology Code available for payment source | Admitting: Psychiatry

## 2021-12-13 ENCOUNTER — Telehealth (HOSPITAL_COMMUNITY): Payer: No Typology Code available for payment source | Admitting: Psychiatry

## 2021-12-13 ENCOUNTER — Other Ambulatory Visit (HOSPITAL_COMMUNITY): Payer: Self-pay

## 2021-12-13 DIAGNOSIS — F331 Major depressive disorder, recurrent, moderate: Secondary | ICD-10-CM | POA: Diagnosis not present

## 2021-12-13 DIAGNOSIS — F410 Panic disorder [episodic paroxysmal anxiety] without agoraphobia: Secondary | ICD-10-CM | POA: Diagnosis not present

## 2021-12-13 DIAGNOSIS — F840 Autistic disorder: Secondary | ICD-10-CM | POA: Diagnosis not present

## 2021-12-13 DIAGNOSIS — F411 Generalized anxiety disorder: Secondary | ICD-10-CM | POA: Diagnosis not present

## 2021-12-13 MED ORDER — LURASIDONE HCL 20 MG PO TABS
20.0000 mg | ORAL_TABLET | Freq: Every day | ORAL | 0 refills | Status: DC
  Filled 2021-12-13: qty 30, 30d supply, fill #0

## 2021-12-13 MED ORDER — LORAZEPAM 1 MG PO TABS
1.0000 mg | ORAL_TABLET | Freq: Two times a day (BID) | ORAL | 0 refills | Status: DC | PRN
  Filled 2021-12-13: qty 60, 30d supply, fill #0

## 2021-12-13 NOTE — Progress Notes (Signed)
Virtual Visit via phone Note  I connected with Jill Alexander on 12/13/21 at  2:30 PM EDT by phone. We attempted to connect by a video enabled telemedicine application but the internet connect was too slow.  and verified that I am speaking with the correct person using two identifiers.  Location: Patient: home Provider: office   I discussed the limitations of evaluation and management by telemedicine and the availability of in person appointments. The patient expressed understanding and agreed to proceed.  History of Present Illness: Jill Alexander shares that she is not doing well and that the medication is not helping. She has ongoing depression and anxiety that are worse than a month ago. She is also having severe panic attacks (both stress induced and random) on a  near daily basis. She denies SI/HI. Jill Alexander has ongoing anhedonia, low motivation, lethargy, irritable and agitation. She continues to work. On days she has nothing to do Jill Alexander will often  end up doing nothing. She is still going to therapy every week.    Observations/Objective: Psychiatric Specialty Exam:  General Appearance: unable to assess  Eye Contact:  unable to assess  Speech:  Clear and Coherent and Slow  Volume:  Normal  Mood:  Anxious and Depressed  Affect:  Blunt  Thought Process:  Coherent and Descriptions of Associations: Circumstantial  Orientation:  Full (Time, Place, and Person)  Thought Content:  Logical  Suicidal Thoughts:  No  Homicidal Thoughts:  No  Memory:  Immediate;   Good  Judgement:  Good  Insight:  Good  Psychomotor Activity: unable to assess  Concentration:  Concentration: Good  Recall:  Good  Fund of Knowledge:  Good  Language:  Good  Akathisia:  unable to assess  Handed:  unable to assess  AIMS (if indicated):     Assets:  Communication Skills Desire for Improvement Financial Resources/Insurance Housing Resilience Social Support Talents/Skills Transportation Vocational/Educational   ADL's:  unable to assess  Cognition:  WNL  Sleep:         Assessment and Plan:     12/13/2021    2:42 PM 11/08/2021   11:00 AM 10/29/2018   12:21 PM 06/02/2018    9:00 AM 05/22/2018    9:00 AM  Depression screen PHQ 2/9  Decreased Interest 2 2 3 3 2   Down, Depressed, Hopeless 3 3 3 2 3   PHQ - 2 Score 5 5 6 5 5   Altered sleeping 3 3 3 3 3   Tired, decreased energy 3 3 3 3 1   Change in appetite 2 2 3 2 1   Feeling bad or failure about yourself  2 2 3 3 3   Trouble concentrating 3 3 1 3 3   Moving slowly or fidgety/restless 0 0 1 0 0  Suicidal thoughts 0 0 1 2 1   PHQ-9 Score 18 18 21 21 17   Difficult doing work/chores Very difficult Very difficult       Flowsheet Row Video Visit from 12/13/2021 in [facility] Office Visit from 11/08/2021 in [facility] ED from [facility]/30/2021 in [facility] RISK CATEGORY No Risk No Risk No Risk        Pt is aware that these meds carry a teratogenic risk. Pt will discuss plan of action if she does or plans to become pregnant in the future.  Status of current problems: ongoing depression  Meds: continue Prozac 60mg  po qD  D/c Abilify Start trial Latuda 20mg  po  qD  1. Moderate episode of recurrent major depressive disorder (HCC) - lurasidone (LATUDA) 20 MG TABS tablet; Take 1 tablet (20 mg total) by mouth daily with breakfast.  Dispense: 30 tablet; Refill: 0  2. GAD (generalized anxiety disorder)  3. Panic attacks  4. Autism spectrum disorder     Labs: none today    Therapy: brief supportive therapy provided.     Collaboration of Care: Other none  Patient/Guardian was advised Release of Information must be obtained prior to any record release in order to collaborate their care with an outside provider. Patient/Guardian was advised if they have not already done so to contact the registration department to sign all necessary forms in  order for [place] to release information regarding their care.   Consent: Patient/Guardian gives verbal consent for treatment and assignment of benefits for services provided during this visit. Patient/Guardian expressed understanding and agreed to proceed.    Follow Up Instructions: Follow up in 1 week or sooner if needed    I discussed the assessment and treatment plan with the patient. The patient was provided an opportunity to ask questions and all were answered. The patient agreed with the plan and demonstrated an understanding of the instructions.   The patient was advised to call back or seek an in-person evaluation if the symptoms worsen or if the condition fails to improve as anticipated.  I provided 20 minutes of non-face-to-face time during this encounter.   Oletta Darter, MD

## 2021-12-14 ENCOUNTER — Other Ambulatory Visit (HOSPITAL_COMMUNITY): Payer: Self-pay | Admitting: Psychiatry

## 2021-12-14 ENCOUNTER — Other Ambulatory Visit (HOSPITAL_COMMUNITY): Payer: Self-pay

## 2021-12-14 DIAGNOSIS — F331 Major depressive disorder, recurrent, moderate: Secondary | ICD-10-CM

## 2021-12-14 DIAGNOSIS — F410 Panic disorder [episodic paroxysmal anxiety] without agoraphobia: Secondary | ICD-10-CM

## 2021-12-14 DIAGNOSIS — F411 Generalized anxiety disorder: Secondary | ICD-10-CM

## 2021-12-18 ENCOUNTER — Ambulatory Visit: Payer: No Typology Code available for payment source | Admitting: Clinical

## 2021-12-18 ENCOUNTER — Other Ambulatory Visit (HOSPITAL_COMMUNITY): Payer: Self-pay

## 2021-12-19 ENCOUNTER — Ambulatory Visit (INDEPENDENT_AMBULATORY_CARE_PROVIDER_SITE_OTHER): Payer: No Typology Code available for payment source | Admitting: Clinical

## 2021-12-19 DIAGNOSIS — F840 Autistic disorder: Secondary | ICD-10-CM

## 2021-12-19 NOTE — Progress Notes (Signed)
Time: 11:01-11:55am CPT Code: 67124P-80 Diagnosis: F84.0  Jill Alexander was seen remotely using secure video conferencing. She was in her home and the therapist was in her office at the time of the appointment. She reflected upon frustrations with her job, and shared that she has reached out to her boss to schedule a conversation about taking on more responsibility. Therapist engaged her in a discussion of what she might say in this conversation. She also discussed her feelings of insecurity in the workplace, and therapist reframed several of these situations, encouraging her to consider alternate interpretations. She is scheduled to be seen again in one week.       [facility] Counselor/Therapist Progress Note   Patient ID: [name], MRN: [id number],     Individualized Treatment Plan Strengths: Enjoys reading, Seeking counseling, In training program through [employer], "I like humor and jokes"  Supports: Her dog, her bestfriend since childhood, her parents, her sister    Goal/Needs for Treatment:  In order of importance to patient 1) Emotional Regulation/Anxiety 2) Improve Positive self talk      Client Statement of Needs: "Find ways of being nicer to myself", cope w/ anxiety and worry    Treatment Level:Outpatient Counseling  Symptoms:Anxiety, Perseverative worry, Negative self talk  Client Treatment Preferences:weekly counseling, return to Dr. Dewayne Hatch in Feb 2023 Prefers to be called Jill Alexander    Healthcare consumer's goal for treatment:   [name], [facility] will support the patient's ability to achieve the goals identified. Cognitive Behavioral Therapy, Supportive Counseling, Mindfulness and Relaxation Strategies and other evidenced-based practices will be used to promote progress towards healthy functioning.    Healthcare consumer will: Actively participate in therapy, working towards healthy functioning.     *Justification for Continuation/Discontinuation of  Goal: R=Revised, O=Ongoing, A=Achieved, D=Discontinued   Goal 1) Jill Alexander will develop strategies to regulate her emotions and improve her positive self talk Likert rating baseline date 04/12/21: How Easy - 3; How Often - 50% Target Date Goal Was reviewed Status Code Progress towards goal/Likert rating  04/12/22   New Maintained                      Jill Alexander participated in development of tx plan and provided verbal consent.         Chrissie Noa, PhD               Chrissie Noa, PhD

## 2021-12-20 ENCOUNTER — Encounter: Payer: Self-pay | Admitting: Neurology

## 2021-12-20 ENCOUNTER — Ambulatory Visit: Payer: No Typology Code available for payment source | Admitting: Neurology

## 2021-12-20 ENCOUNTER — Telehealth (HOSPITAL_BASED_OUTPATIENT_CLINIC_OR_DEPARTMENT_OTHER): Payer: No Typology Code available for payment source | Admitting: Psychiatry

## 2021-12-20 ENCOUNTER — Other Ambulatory Visit (HOSPITAL_COMMUNITY): Payer: Self-pay

## 2021-12-20 DIAGNOSIS — F411 Generalized anxiety disorder: Secondary | ICD-10-CM

## 2021-12-20 DIAGNOSIS — F410 Panic disorder [episodic paroxysmal anxiety] without agoraphobia: Secondary | ICD-10-CM | POA: Diagnosis not present

## 2021-12-20 DIAGNOSIS — F331 Major depressive disorder, recurrent, moderate: Secondary | ICD-10-CM | POA: Diagnosis not present

## 2021-12-20 MED ORDER — LURASIDONE HCL 20 MG PO TABS
20.0000 mg | ORAL_TABLET | Freq: Every day | ORAL | 0 refills | Status: DC
  Filled 2021-12-20 – 2022-01-07 (×2): qty 90, 90d supply, fill #0

## 2021-12-20 MED ORDER — LORAZEPAM 1 MG PO TABS
1.0000 mg | ORAL_TABLET | Freq: Two times a day (BID) | ORAL | 0 refills | Status: DC | PRN
  Filled 2021-12-20 – 2022-01-16 (×2): qty 60, 30d supply, fill #0

## 2021-12-20 NOTE — Progress Notes (Signed)
Virtual Visit via Video Note  I connected with Jill Alexander on 12/20/21 at  2:45 PM EDT by   a video enabled telemedicine application and verified that I am speaking with the correct person using two identifiers.  Location: Patient: home Provider: office   I discussed the limitations of evaluation and management by telemedicine and the availability of in person appointments. The patient expressed understanding and agreed to proceed.  History of Present Illness: This is a 1 week follow up visit. Last week we discontinued Abilify and started Jordan. She has been taking Latuda for about 6 days or so. She is tolerating it well and denies SE. Jill Alexander is able to fall asleep but doesn't feel it is restful. Jill Alexander states her fatigue is unchanged. She is trying to get a sleep study because she was told she snores. She is having a little bit of constipation and has stress induced headaches. Jill Alexander has been told to cut down on her caffeine intake. Her depression is unchanged. Some days are better than others. She is having bad days where she has no motivation to do anything and wants to stay in bed and feels down.  Jill Alexander denies any SI/HI.    Observations/Objective: Psychiatric Specialty Exam: ROS  There were no vitals taken for this visit.There is no height or weight on file to calculate BMI.  General Appearance: Casual and Neat  Eye Contact:  Poor  Speech:  Clear and Coherent and Slow  Volume:  Normal  Mood:  Depressed  Affect:  Flat  Thought Process:  Coherent and Descriptions of Associations: Circumstantial  Orientation:  Full (Time, Place, and Person)  Thought Content:  Logical  Suicidal Thoughts:  No  Homicidal Thoughts:  No  Memory:  Immediate;   Good  Judgement:  Good  Insight:  Good  Psychomotor Activity:  Normal  Concentration:  Concentration: Good  Recall:  Good  Fund of Knowledge:  Good  Language:  Good  Akathisia:  No  Handed:  Right  AIMS (if indicated):     Assets:   Communication Skills Desire for Improvement Financial Resources/Insurance Housing Resilience Social Support Talents/Skills Transportation Vocational/Educational  ADL's:  Intact  Cognition:  WNL  Sleep:        Assessment and Plan:     12/13/2021    2:42 PM 11/08/2021   11:00 AM 10/29/2018   12:21 PM 06/02/2018    9:00 AM 05/22/2018    9:00 AM  Depression screen PHQ 2/9  Decreased Interest 2 2 3 3 2   Down, Depressed, Hopeless 3 3 3 2 3   PHQ - 2 Score 5 5 6 5 5   Altered sleeping 3 3 3 3 3   Tired, decreased energy 3 3 3 3 1   Change in appetite 2 2 3 2 1   Feeling bad or failure about yourself  2 2 3 3 3   Trouble concentrating 3 3 1 3 3   Moving slowly or fidgety/restless 0 0 1 0 0  Suicidal thoughts 0 0 1 2 1   PHQ-9 Score 18 18 21 21 17   Difficult doing work/chores Very difficult Very difficult       Flowsheet Row Video Visit from 12/13/2021 in [facility] Office Visit from 11/08/2021 in [facility] ED from 04/30/2021 in [facility] RISK CATEGORY No Risk No Risk No Risk        Pt is aware that these meds carry a teratogenic risk. Pt  will discuss plan of action if she does or plans to become pregnant in the future.  Status of current problems: ongoing depression. She is tolerating the Latuda and wants to continue it.  Meds:  Continue Prozac 60mg  po qD Latuda 20mg  po qD Ativan 1mg  po BID prn anxiety 1. Moderate episode of recurrent major depressive disorder (HCC) - lurasidone (LATUDA) 20 MG TABS tablet; Take 1 tablet (20 mg total) by mouth daily with breakfast.  Dispense: 90 tablet; Refill: 0  2. Panic attacks - LORazepam (ATIVAN) 1 MG tablet; Take 1 tablet (1 mg total) by mouth 2 (two) times daily as needed for anxiety.  Dispense: 60 tablet; Refill: 0  3. GAD (generalized anxiety disorder) - LORazepam (ATIVAN) 1 MG tablet; Take 1 tablet (1 mg total) by mouth 2  (two) times daily as needed for anxiety.  Dispense: 60 tablet; Refill: 0     Labs: none today    Therapy: brief supportive therapy provided.     Collaboration of Care: Referral or follow-up with counselor/therapist AEB weekly therapy  Patient/Guardian was advised Release of Information must be obtained prior to any record release in order to collaborate their care with an outside provider. Patient/Guardian was advised if they have not already done so to contact the registration department to sign all necessary forms in order for to release information regarding their care.   Consent: Patient/Guardian gives verbal consent for treatment and assignment of benefits for services provided during this visit. Patient/Guardian expressed understanding and agreed to proceed.      Follow Up Instructions: Follow up in 1-2 months or sooner if needed    I discussed the assessment and treatment plan with the patient. The patient was provided an opportunity to ask questions and all were answered. The patient agreed with the plan and demonstrated an understanding of the instructions.   The patient was advised to call back or seek an in-person evaluation if the symptoms worsen or if the condition fails to improve as anticipated.  I provided 13 minutes of non-face-to-face time during this encounter.   , MD

## 2021-12-21 ENCOUNTER — Other Ambulatory Visit (HOSPITAL_COMMUNITY): Payer: Self-pay

## 2021-12-24 ENCOUNTER — Other Ambulatory Visit (HOSPITAL_COMMUNITY): Payer: Self-pay

## 2021-12-24 ENCOUNTER — Other Ambulatory Visit: Payer: Self-pay

## 2021-12-24 MED ORDER — LAMOTRIGINE 200 MG PO TABS
200.0000 mg | ORAL_TABLET | Freq: Two times a day (BID) | ORAL | 2 refills | Status: DC
  Filled 2021-12-24: qty 60, 30d supply, fill #0
  Filled 2022-01-16: qty 60, 30d supply, fill #1
  Filled 2022-02-21: qty 60, 30d supply, fill #2

## 2021-12-24 NOTE — Progress Notes (Signed)
Rx filled

## 2021-12-25 ENCOUNTER — Ambulatory Visit (INDEPENDENT_AMBULATORY_CARE_PROVIDER_SITE_OTHER): Payer: No Typology Code available for payment source | Admitting: Obstetrics and Gynecology

## 2021-12-25 ENCOUNTER — Other Ambulatory Visit (HOSPITAL_COMMUNITY): Payer: Self-pay

## 2021-12-25 ENCOUNTER — Other Ambulatory Visit (HOSPITAL_BASED_OUTPATIENT_CLINIC_OR_DEPARTMENT_OTHER): Payer: Self-pay

## 2021-12-25 ENCOUNTER — Ambulatory Visit (INDEPENDENT_AMBULATORY_CARE_PROVIDER_SITE_OTHER): Payer: No Typology Code available for payment source | Admitting: Clinical

## 2021-12-25 ENCOUNTER — Encounter: Payer: Self-pay | Admitting: Obstetrics and Gynecology

## 2021-12-25 VITALS — BP 105/70 | HR 67 | Ht 62.0 in | Wt 124.0 lb

## 2021-12-25 DIAGNOSIS — Z01419 Encounter for gynecological examination (general) (routine) without abnormal findings: Secondary | ICD-10-CM | POA: Insufficient documentation

## 2021-12-25 DIAGNOSIS — F840 Autistic disorder: Secondary | ICD-10-CM | POA: Diagnosis not present

## 2021-12-25 MED ORDER — DROSPIRENONE-ETHINYL ESTRADIOL 3-0.02 MG PO TABS
1.0000 | ORAL_TABLET | Freq: Every day | ORAL | 11 refills | Status: DC
  Filled 2021-12-25: qty 84, 84d supply, fill #0
  Filled 2022-04-25: qty 84, 84d supply, fill #1
  Filled 2022-07-20: qty 84, 84d supply, fill #2
  Filled 2022-10-02: qty 84, 84d supply, fill #3

## 2021-12-25 NOTE — Progress Notes (Signed)
Time: 11:11-11:55am CPT Code: 33354T-62 Diagnosis: F84.0  Jill Alexander was seen remotely using secure video conferencing. She was in her home and the therapist was in her home at the time of the appointment. She joined session late due to technical difficulties. She shared that she had had the conversation with her boss, and it had gone well but the outcome has caused her to consider exploring a second job. She also shared that she had gone on a date and it had gone well, and she had plans to see the person again soon. Therapist provided validation and support. She is scheduled to be seen again in two weeks.     [facility] Counselor/Therapist Progress Note   Patient ID: Jill Alexander, MRN: [id number],     Individualized Treatment Plan Strengths: Enjoys reading, Seeking counseling, In training program through [employer], "I like humor and jokes"  Supports: Her dog, her bestfriend since childhood, her parents, her sister    Goal/Needs for Treatment:  In order of importance to patient 1) Emotional Regulation/Anxiety 2) Improve Positive self talk      Client Statement of Needs: "Find ways of being nicer to myself", cope w/ anxiety and worry    Treatment Level:Outpatient Counseling  Symptoms:Anxiety, Perseverative worry, Negative self talk  Client Treatment Preferences:weekly counseling, return to Dr. Dewayne Hatch in Feb 2023 Prefers to be called Jill Alexander    Healthcare consumer's goal for treatment:   [name], [facility] will support the patient's ability to achieve the goals identified. Cognitive Behavioral Therapy, Supportive Counseling, Mindfulness and Relaxation Strategies and other evidenced-based practices will be used to promote progress towards healthy functioning.    Healthcare consumer will: Actively participate in therapy, working towards healthy functioning.     *Justification for Continuation/Discontinuation of Goal: R=Revised, O=Ongoing, A=Achieved, D=Discontinued    Goal 1) Jill Alexander will develop strategies to regulate her emotions and improve her positive self talk Likert rating baseline date 04/12/21: How Easy - 3; How Often - 50% Target Date Goal Was reviewed Status Code Progress towards goal/Likert rating  04/12/22   New Maintained                      Jill Alexander participated in development of tx plan and provided verbal consent.             Chrissie Noa, PhD               Chrissie Noa, PhD

## 2021-12-25 NOTE — Progress Notes (Signed)
GYNECOLOGY ANNUAL PREVENTATIVE CARE ENCOUNTER NOTE  History:     Jill Alexander is a 26 y.o. G0P0000 female here for a routine annual gynecologic exam.  Current complaints: None. Doing well on Yaz BC.   Denies abnormal vaginal bleeding, discharge, pelvic pain, problems with intercourse or other gynecologic concerns.   Currently working at [employer] 4 hours a week. Trying to get a 2nd job or more hours.    Gynecologic History No LMP recorded. (Menstrual status: Oral contraceptives). Contraception: OCP (estrogen/progesterone) Last Pap: 2021. Result was normal with negative HPV Last Mammogram: NA Last Colonoscopy:   Obstetric History OB History  Gravida Para Term Preterm AB Living  0 0 0 0 0 0  SAB IAB Ectopic Multiple Live Births  0 0 0 0 0    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    Past Surgical History:  Procedure Laterality Date   none      Current Outpatient Medications on File Prior to Visit  Medication Sig Dispense Refill   acetaminophen (TYLENOL) 500 MG tablet Take 500 mg by mouth every 6 (six) hours as needed.     Docusate Sodium (COLACE PO) Take by mouth.     drospirenone-ethinyl estradiol (YAZ) 3-0.02 MG tablet Take 1 tablet by mouth daily. 28 tablet 11   Erenumab-aooe (AIMOVIG) 140 MG/ML SOAJ Inject 140 mg into the skin every 30 (thirty) days. 1 mL 11   Famotidine (PEPCID PO) Take by mouth.     FLUoxetine (PROZAC) 20 MG capsule Take 3 capsules (60 mg total) by mouth daily. 270 capsule 0   Ibuprofen (MOTRIN PO) Take 200 mg by mouth as needed.     lamoTRIgine (LAMICTAL) 200 MG tablet Take 1 tablet (200 mg total) by mouth 2 (two) times daily for seizures and depression. 60 tablet 2   LORazepam (ATIVAN) 1 MG  tablet Take 1 tablet (1 mg total) by mouth 2 (two) times daily as needed for anxiety. 60 tablet 0   lurasidone (LATUDA) 20 MG TABS tablet Take 1 tablet (20 mg total) by mouth daily with breakfast. 90 tablet 0   melatonin 1 MG TABS tablet Take 1 tablet (1 mg total) by mouth at bedtime as needed (insomnia). 30 tablet 0   Multiple Vitamins-Minerals (MULTIVITAMIN PO) Take 1 tablet by mouth daily.     omeprazole (PRILOSEC) 20 MG capsule Take 1 capsule (20 mg total) by mouth daily. 30 capsule 5   polyethylene glycol (MIRALAX) packet Take 17 g by mouth daily as needed. 14 each 0   rizatriptan (MAXALT) 10 MG tablet Take 1 tablet (10 mg total) by mouth as needed for migraine. May repeat in 2 hours if needed. 10 tablet 11   topiramate (TOPAMAX) 50 MG tablet Take 3 tablets (150 mg total) by mouth at bedtime. 270 tablet 1   [DISCONTINUED] Norethin Ace-Eth Estrad-FE (TAYTULLA) 1-20 MG-MCG(24) CAPS TAKE 1 CAPSULE BY MOUTH DAILY. 28 capsule 11   No current facility-administered medications on file prior to visit.    Allergies  Allergen Reactions   Gluten Meal     Social History:  reports that she has never smoked. She has never used smokeless tobacco.  She reports current alcohol use. She reports that she does not use drugs.  Family History  Problem Relation Age of Onset   Depression Father    Depression Sister    Anxiety disorder Sister    Diabetes Maternal Grandfather    Cancer Maternal Grandfather    Breast cancer Paternal Grandmother    Uterine cancer Paternal Grandmother    Irritable bowel syndrome Paternal Grandmother    Colon cancer Neg Hx    Esophageal cancer Neg Hx    Stomach cancer Neg Hx    Rectal cancer Neg Hx     The following portions of the patient's history were reviewed and updated as appropriate: allergies, current medications, past family history, past medical history, past social history, past surgical history and problem list.  Review of Systems Pertinent items noted in  HPI and remainder of comprehensive ROS otherwise negative.  Physical Exam:  BP 105/70   Pulse 67   Ht 5\' 2"  (1.575 m)   Wt 124 lb (56.2 kg)   BMI 22.68 kg/m  CONSTITUTIONAL: Well-developed, well-nourished female in no acute distress.  HENT:  Normocephalic, atraumatic, External right and left ear normal.  EYES: Conjunctivae and EOM are normal. Pupils are equal, round, and reactive to light. No scleral icterus.  NECK: Normal range of motion, supple, no masses.  Normal thyroid.  SKIN: Skin is warm and dry. No rash noted. Not diaphoretic. No erythema. No pallor. MUSCULOSKELETAL: Normal range of motion. No tenderness.  No cyanosis, clubbing, or edema. NEUROLOGIC: Alert and oriented to person, place, and time. Normal reflexes, muscle tone coordination.  PSYCHIATRIC: Normal mood and affect. Normal behavior. Normal judgment and thought content. CARDIOVASCULAR: Normal heart rate noted, regular rhythm RESPIRATORY: Clear to auscultation bilaterally. Effort and breath sounds normal, no problems with respiration noted. BREASTS: Symmetric in size. No masses, tenderness, skin changes, nipple drainage, or lymphadenopathy bilaterally. Performed in the presence of a chaperone. ABDOMEN: Soft, no distention noted.  No tenderness, rebound or guarding.  PELVIC: Deferred  Assessment and Plan:    1. Women's annual routine gynecological examination  - pap due next year - Refill on [facility]  Routine preventative health maintenance measures emphasized. Please refer to After Visit Summary for other counseling recommendations.    Donnica Jarnagin, [facility] NP [facility]olyn Ruther[facility], [facility]

## 2021-12-26 ENCOUNTER — Other Ambulatory Visit (HOSPITAL_COMMUNITY): Payer: Self-pay

## 2021-12-27 ENCOUNTER — Other Ambulatory Visit (HOSPITAL_COMMUNITY): Payer: Self-pay

## 2021-12-28 ENCOUNTER — Other Ambulatory Visit (HOSPITAL_COMMUNITY): Payer: Self-pay

## 2021-12-28 MED ORDER — MYRBETRIQ 50 MG PO TB24
50.0000 mg | ORAL_TABLET | Freq: Every day | ORAL | 3 refills | Status: DC
  Filled 2021-12-28: qty 30, 30d supply, fill #0
  Filled 2022-01-24: qty 30, 30d supply, fill #1
  Filled 2022-03-06: qty 30, 30d supply, fill #2
  Filled 2022-04-10: qty 30, 30d supply, fill #3

## 2022-01-01 ENCOUNTER — Other Ambulatory Visit (HOSPITAL_COMMUNITY): Payer: Self-pay

## 2022-01-01 ENCOUNTER — Ambulatory Visit: Payer: No Typology Code available for payment source | Admitting: Clinical

## 2022-01-02 ENCOUNTER — Encounter: Payer: Self-pay | Admitting: Internal Medicine

## 2022-01-02 ENCOUNTER — Other Ambulatory Visit (HOSPITAL_COMMUNITY): Payer: Self-pay

## 2022-01-02 ENCOUNTER — Telehealth (HOSPITAL_COMMUNITY): Payer: Self-pay | Admitting: *Deleted

## 2022-01-02 NOTE — Telephone Encounter (Signed)
Writer spoke with pt who called nurse line to report some s/e of Latuda 20 mg qd. Pt says that she has been having "GI issues" primarily constipation, and some diarrhea. It is noted on her last visit with you that she was constipation when starting Latuda. Pt states she's been intermittently taking Miralax and is now taking Latuda at [facility] as it was causing some sedation.pt would like your opinion on this matter. Pt next f/u appointment is scheduled for 01/17/22. Please review.

## 2022-01-03 ENCOUNTER — Other Ambulatory Visit (HOSPITAL_COMMUNITY): Payer: Self-pay

## 2022-01-08 ENCOUNTER — Other Ambulatory Visit (HOSPITAL_COMMUNITY): Payer: Self-pay

## 2022-01-08 ENCOUNTER — Ambulatory Visit (INDEPENDENT_AMBULATORY_CARE_PROVIDER_SITE_OTHER): Payer: No Typology Code available for payment source | Admitting: Clinical

## 2022-01-08 DIAGNOSIS — F840 Autistic disorder: Secondary | ICD-10-CM

## 2022-01-08 NOTE — Progress Notes (Signed)
Time: 11:03-11:58 am CPT Code: 90837P-95 Diagnosis: F84.0  Jill Alexander was seen remotely using secure video conferencing. She was in her home and the therapist was in her office at the time of the appointment. She had reached out to the therapist via secure message related to a conversation she had had with her mother, during which Jill Alexander had interpreted her mother as saying that Jill Alexander's poor self-esteem will drive others away. Therapist suggested revisiting the conversation to understand whether she had fully understood the conversation in the manner intended. Therapist also sought to normalize that challenges in social situations do not make Jill Alexander unlikeable. Jill Alexander shared that she had not continued seeing the gentleman she had met prior to her last session due to the distance. Therapist suggested looking into [facility] as a potential way to meet others, and provided information. She is scheduled to be seen again in one week.     [facility] Counselor/Therapist Progress Note   Patient ID: [name], MRN: [id number],     Individualized Treatment Plan Strengths: Enjoys reading, Seeking counseling, In training program through [employer], "I like humor and jokes"  Supports: Her dog, her bestfriend since childhood, her parents, her sister    Goal/Needs for Treatment:  In order of importance to patient 1) Emotional Regulation/Anxiety 2) Improve Positive self talk      Client Statement of Needs: "Find ways of being nicer to myself", cope w/ anxiety and worry    Treatment Level:Outpatient Counseling  Symptoms:Anxiety, Perseverative worry, Negative self talk  Client Treatment Preferences:weekly counseling, return to Dr.  in Feb 2023 Prefers to be called Jill Alexander    Healthcare consumer's goal for treatment:   Leanne Yates, [facility] will support the patient's ability to achieve the goals identified. Cognitive Behavioral Therapy, Supportive Counseling, Mindfulness and Relaxation  Strategies and other evidenced-based practices will be used to promote progress towards healthy functioning.    Healthcare consumer will: Actively participate in therapy, working towards healthy functioning.     *Justification for Continuation/Discontinuation of Goal: R=Revised, O=Ongoing, A=Achieved, D=Discontinued   Goal 1) Jill Alexander will develop strategies to regulate her emotions and improve her positive self talk Likert rating baseline date 04/12/21: How Easy - 3; How Often - 50% Target Date Goal Was reviewed Status Code Progress towards goal/Likert rating  04/12/22   New Maintained                      Jill Alexander participated in development of tx plan and provided verbal consent.           L , PhD                L , PhD 

## 2022-01-15 ENCOUNTER — Ambulatory Visit (INDEPENDENT_AMBULATORY_CARE_PROVIDER_SITE_OTHER): Payer: No Typology Code available for payment source | Admitting: Clinical

## 2022-01-15 DIAGNOSIS — F840 Autistic disorder: Secondary | ICD-10-CM

## 2022-01-15 NOTE — Progress Notes (Signed)
Time: 11:03-11:58 am CPT Code: 07371G-62 Diagnosis: F84.0  Jill Alexander was seen remotely using secure video conferencing. She was in her home and the therapist was in her office at the time of the appointment. Session focused on anxiety she has been experiencing related to eating. She reported that she struggles to decide what to eat, and often second guesses how much. Therapist encouraged her to track her anxiety in the coming week for homework, in order to discuss further and develop strategies in her next session. She is scheduled to be seen again in one week.     [facility] Counselor/Therapist Progress Note   Patient ID: [name], MRN: [id number],     Individualized Treatment Plan Strengths: Enjoys reading, Seeking counseling, In training program through [employer], "I like humor and jokes"  Supports: Her dog, her bestfriend since childhood, her parents, her sister    Goal/Needs for Treatment:  In order of importance to patient 1) Emotional Regulation/Anxiety 2) Improve Positive self talk      Client Statement of Needs: "Find ways of being nicer to myself", cope w/ anxiety and worry    Treatment Level:Outpatient Counseling  Symptoms:Anxiety, Perseverative worry, Negative self talk  Client Treatment Preferences:weekly counseling, return to Dr. [name] in Feb 2023 Prefers to be called Jill Alexander    Healthcare consumer's goal for treatment:   [name], [facility] will support the patient's ability to achieve the goals identified. Cognitive Behavioral Therapy, Supportive Counseling, Mindfulness and Relaxation Strategies and other evidenced-based practices will be used to promote progress towards healthy functioning.    Healthcare consumer will: Actively participate in therapy, working towards healthy functioning.     *Justification for Continuation/Discontinuation of Goal: R=Revised, O=Ongoing, A=Achieved, D=Discontinued   Goal 1) Jill Alexander will develop strategies to  regulate her emotions and improve her positive self talk Likert rating baseline date 04/12/21: How Easy - 3; How Often - 50% Target Date Goal Was reviewed Status Code Progress towards goal/Likert rating  04/12/22   New Maintained                      Jill Alexander participated in development of tx plan and provided verbal consent.         Myrtie Cruise, PhD               Myrtie Cruise, PhD

## 2022-01-16 ENCOUNTER — Other Ambulatory Visit (HOSPITAL_COMMUNITY): Payer: Self-pay

## 2022-01-17 ENCOUNTER — Telehealth (HOSPITAL_BASED_OUTPATIENT_CLINIC_OR_DEPARTMENT_OTHER): Payer: No Typology Code available for payment source | Admitting: Psychiatry

## 2022-01-17 ENCOUNTER — Other Ambulatory Visit (HOSPITAL_COMMUNITY): Payer: Self-pay

## 2022-01-17 DIAGNOSIS — F331 Major depressive disorder, recurrent, moderate: Secondary | ICD-10-CM | POA: Diagnosis not present

## 2022-01-17 DIAGNOSIS — F410 Panic disorder [episodic paroxysmal anxiety] without agoraphobia: Secondary | ICD-10-CM

## 2022-01-17 DIAGNOSIS — F411 Generalized anxiety disorder: Secondary | ICD-10-CM

## 2022-01-17 MED ORDER — LURASIDONE HCL 40 MG PO TABS
40.0000 mg | ORAL_TABLET | Freq: Every day | ORAL | 0 refills | Status: DC
  Filled 2022-01-17: qty 90, 90d supply, fill #0

## 2022-01-17 MED ORDER — LORAZEPAM 1 MG PO TABS
1.0000 mg | ORAL_TABLET | Freq: Two times a day (BID) | ORAL | 1 refills | Status: DC | PRN
  Filled 2022-01-17: qty 60, 30d supply, fill #0
  Filled 2022-02-13 – 2022-02-14 (×2): qty 60, 30d supply, fill #1

## 2022-01-17 MED ORDER — FLUOXETINE HCL 20 MG PO CAPS
60.0000 mg | ORAL_CAPSULE | Freq: Every day | ORAL | 0 refills | Status: DC
  Filled 2022-01-17: qty 48, 16d supply, fill #0
  Filled 2022-01-21: qty 222, 74d supply, fill #1
  Filled 2022-01-21: qty 48, 16d supply, fill #0
  Filled 2022-01-21 (×2): qty 222, 74d supply, fill #0

## 2022-01-17 NOTE — Progress Notes (Signed)
Virtual Visit via Video Note  I connected with Jill Alexander on 01/17/22 at  3:15 PM EDT by  a video enabled telemedicine application and verified that I am speaking with the correct person using two identifiers.  Location: Patient: home Provider: office   I discussed the limitations of evaluation and management by telemedicine and the availability of in person appointments. The patient expressed understanding and agreed to proceed.  History of Present Illness: Jill Alexander shares she is going ok. She is not sure if the [place] is helping much. She is taking it at night because it was making it her tired. Her sleep is ok but still doesn't feel rested when she wakes up. She is going to get a sleep study.  Jill Alexander has daily, random panic attacks. Sometimes she has stress induced panic attacks. Her anxiety is unchanged and has not improved in any way. She feels like something is in her throat. This used to happen to her as a child and she was told it was a stress response. She feels anxious most days. Jill Alexander has racing thoughts and inability to calm herself. Her depression is ongoing. It has not worsened. She has a lot of negative self thoughts and outlook on a daily basis. Some days are not as bad as others. Jill Alexander often feels "spacey" and has poor focus most days. Her appetite is fair. She denies passive thoughts of death. She has on/off SI without plan or intent. She states she never thinks about a plan and has no intention of killing herself.  Jill Alexander has these thoughts a couple of times a week. This has been going on a long while and she is working on it in therapy. The last time she experienced SI was a few days ago. She denies HI.    Observations/Objective: Psychiatric Specialty Exam: ROS  There were no vitals taken for this visit.There is no height or weight on file to calculate BMI.  General Appearance: Casual  Eye Contact:  Minimal  Speech:  Clear and Coherent and Slow  Volume:  Normal  Mood:   Anxious and Depressed  Affect:  Blunt  Thought Process:  Coherent and Descriptions of Associations: Circumstantial  Orientation:  Full (Time, Place, and Person)  Thought Content:  Logical  Suicidal Thoughts:  No  Homicidal Thoughts:  No  Memory:  Immediate;   Good  Judgement:  Fair  Insight:  Fair  Psychomotor Activity:  Normal  Concentration:  Concentration: Good  Recall:  Good  Fund of Knowledge:  Good  Language:  Good  Akathisia:  No  Handed:  Right  AIMS (if indicated):     Assets:  Communication Skills Desire for Improvement Financial Resources/Insurance Housing Social Support Talents/Skills Transportation Vocational/Educational  ADL's:  Intact  Cognition:  WNL  Sleep:        Assessment and Plan:     01/17/2022    3:27 PM 12/13/2021    2:42 PM 11/08/2021   11:00 AM 10/29/2018   12:21 PM 06/02/2018    9:00 AM  Depression screen PHQ 2/9  Decreased Interest  2 2 3 3   Down, Depressed, Hopeless 3 3 3 3 2   PHQ - 2 Score 3 5 5 6 5   Altered sleeping 3 3 3 3 3   Tired, decreased energy 3 3 3 3 3   Change in appetite 0 2 2 3 2   Feeling bad or failure about yourself  3 2 2 3 3   Trouble concentrating 3 3 3 1  3  Moving slowly or fidgety/restless 0 0 0 1 0  Suicidal thoughts 0 0 0 1 2  PHQ-9 Score 15 18 18 21 21   Difficult doing work/chores Very difficult Very difficult Very difficult      Flowsheet Row [facility]/17/20[facility] in [facility] Video Visit from 12/13/2021 in [facility] Office Visit from 11/08/2021 in [facility]  C-SSRS RISK CATEGORY Low Risk No Risk No Risk         Pt is aware that these meds carry a teratogenic risk. Pt will discuss plan of action if she does or plans to become pregnant in the future.  Status of current problems: ongoing depression and anxiety  Meds: increase Latuda 40mg  po qHS 1. Moderate episode of recurrent  major depressive disorder (HCC) - FLUoxetine (PROZAC) 20 MG capsule; Take 3 capsules (60 mg total) by mouth daily.  Dispense: 270 capsule; Refill: 0  2. Panic attacks - FLUoxetine (PROZAC) 20 MG capsule; Take 3 capsules (60 mg total) by mouth daily.  Dispense: 270 capsule; Refill: 0 - LORazepam (ATIVAN) 1 MG tablet; Take 1 tablet (1 mg total) by mouth 2 (two) times daily as needed for anxiety.  Dispense: 60 tablet; Refill: 1  3. GAD (generalized anxiety disorder) - FLUoxetine (PROZAC) 20 MG capsule; Take 3 capsules (60 mg total) by mouth daily.  Dispense: 270 capsule; Refill: 0 - LORazepam (ATIVAN) 1 MG tablet; Take 1 tablet (1 mg total) by mouth 2 (two) times daily as needed for anxiety.  Dispense: 60 tablet; Refill: 1     Labs: none    Therapy: brief supportive therapy provided. Discussed psychosocial stressors in detail.      Collaboration of Care: Referral or follow-up with counselor/therapist AEB therapy  Patient/Guardian was advised Release of Information must be obtained prior to any record release in order to collaborate their care with an outside provider. Patient/Guardian was advised if they have not already done so to contact the registration department to sign all necessary forms in order for 11/10/2021 to release information regarding their care.   Consent: Patient/Guardian gives verbal consent for treatment and assignment of benefits for services provided during this visit. Patient/Guardian expressed understanding and agreed to proceed.     Pt's acute risk factors for suicide are ongoing depression and anxiety symptoms. Pt's chronic risk factors are positive family history of mental illness. Pt's protective factors are denying desire to die, denies plan or intent to kill herself, denying drug use, taking meds as prescribed, good social support, good relationship with therapist, living with supportive family and denying any history of suicide attempts in the past.. Pt denies SI with  plan or intent in the last few days and is at an acute low risk for suicide. Patient told to call clinic if any problems occur. Patient advised to go to ER if they should develop SI/HI, side effects, or if symptoms worsen. Pt has crisis numbers to call if needed. Pt acknowledged and agreed with plan and verbalized understanding.  Follow Up Instructions: Follow up in 1-2 months or sooner if needed    I discussed the assessment and treatment plan with the patient. The patient was provided an opportunity to ask questions and all were answered. The patient agreed with the plan and demonstrated an understanding of the instructions.   The patient was advised to call back or seek an in-person evaluation if the symptoms worsen or if the condition fails to improve as  anticipated.  I provided 20 minutes of non-face-to-face time during this encounter.   Charlcie Cradle, MD

## 2022-01-18 ENCOUNTER — Other Ambulatory Visit (HOSPITAL_COMMUNITY): Payer: Self-pay

## 2022-01-21 ENCOUNTER — Other Ambulatory Visit (HOSPITAL_COMMUNITY): Payer: Self-pay

## 2022-01-22 ENCOUNTER — Ambulatory Visit (INDEPENDENT_AMBULATORY_CARE_PROVIDER_SITE_OTHER): Payer: No Typology Code available for payment source | Admitting: Clinical

## 2022-01-22 DIAGNOSIS — F840 Autistic disorder: Secondary | ICD-10-CM | POA: Diagnosis not present

## 2022-01-22 NOTE — Progress Notes (Signed)
Time: 11:03-11:58 am CPT Code: 51025E-52 Diagnosis: F84.0  Jill Alexander was seen remotely using secure video conferencing. She was in her home and the therapist was in her office at the time of the appointment. She shared an increase in depression in recent months, but denied suicidal ideation and intent. Therapist processed this with her, and suggested adding structure to her job search by accessing services through vocational rehabilitation again. She is scheduled to be seen again in one week.k     [place] Counselor/Therapist Progress Note   Patient ID: Jill Alexander, MRN: [id number],     Individualized Treatment Plan Strengths: Enjoys reading, Seeking counseling, In training program through [employer], "I like humor and jokes"  Supports: Her dog, her bestfriend since childhood, her parents, her sister    Goal/Needs for Treatment:  In order of importance to patient 1) Emotional Regulation/Anxiety 2) Improve Positive self talk      Client Statement of Needs: "Find ways of being nicer to myself", cope w/ anxiety and worry    Treatment Level:Outpatient Counseling  Symptoms:Anxiety, Perseverative worry, Negative self talk  Client Treatment Preferences:weekly counseling, return to Dr. [name] in Feb 2023 Prefers to be called Jill Alexander    Healthcare consumer's goal for treatment:   [name], [facility] will support the patient's ability to achieve the goals identified. Cognitive Behavioral Therapy, Supportive Counseling, Mindfulness and Relaxation Strategies and other evidenced-based practices will be used to promote progress towards healthy functioning.    Healthcare consumer will: Actively participate in therapy, working towards healthy functioning.     *Justification for Continuation/Discontinuation of Goal: R=Revised, O=Ongoing, A=Achieved, D=Discontinued   Goal 1) Jill Alexander will develop strategies to regulate her emotions and improve her positive self talk Likert rating  baseline date 04/12/21: How Easy - 3; How Often - 50% Target Date Goal Was reviewed Status Code Progress towards goal/Likert rating  04/12/22   New Maintained                      Jill Alexander participated in development of tx plan and provided verbal consent.              Myrtie Cruise, PhD               Myrtie Cruise, PhD

## 2022-01-24 ENCOUNTER — Other Ambulatory Visit (HOSPITAL_COMMUNITY): Payer: Self-pay

## 2022-01-28 ENCOUNTER — Telehealth (HOSPITAL_COMMUNITY): Payer: Self-pay | Admitting: *Deleted

## 2022-01-28 ENCOUNTER — Other Ambulatory Visit (HOSPITAL_COMMUNITY): Payer: Self-pay

## 2022-01-28 ENCOUNTER — Encounter: Payer: No Typology Code available for payment source | Admitting: Physician Assistant

## 2022-01-28 NOTE — Telephone Encounter (Signed)
Pt called to inquire about starting [place]. I did not see any recent discussion of this. Please review and advise. If yes then I will make the referral for our [place].

## 2022-01-29 ENCOUNTER — Telehealth (HOSPITAL_COMMUNITY): Payer: Self-pay | Admitting: *Deleted

## 2022-01-29 ENCOUNTER — Ambulatory Visit (INDEPENDENT_AMBULATORY_CARE_PROVIDER_SITE_OTHER): Payer: No Typology Code available for payment source | Admitting: Clinical

## 2022-01-29 ENCOUNTER — Telehealth (HOSPITAL_COMMUNITY): Payer: Self-pay | Admitting: Psychiatry

## 2022-01-29 DIAGNOSIS — F840 Autistic disorder: Secondary | ICD-10-CM

## 2022-01-29 NOTE — Progress Notes (Signed)
Time: 11:03-11:58 am CPT Code: [id number]-33 Diagnosis: F84.0  Jill Alexander was seen remotely using secure video conferencing. She was in her home and the therapist was in her office at the time of the appointment. She shared that she had had a suicidal gesture earlier that week, during which she had been closely tempted to overdose on Advil. She had thrown the bottle in the trash and told her parents. She has reached out to behavioral health to obtain a referral for [place] from her psychiatrist. Her parents are aware, and Jill Alexander agreed to ask them to hide medications from her. She also agreed to call behavioral health to complete an assessment for [facility] outpatient care. She provided verbal consent for the therapist to relay this plan to her parents. She will email the therapist securely by 8pm today and on Thursday, as well as after completing the assessment for intensive outpatient. She is scheduled to be seen again in one week.     [facility] Counselor/Therapist Progress Note   Patient ID: Jill Alexander, MRN: [id number],     Individualized Treatment Plan Strengths: Enjoys reading, Seeking counseling, In training program through [facility], "I like humor and jokes"  Supports: Her dog, her bestfriend since childhood, her parents, her sister    Goal/Needs for Treatment:  In order of importance to patient 1) Emotional Regulation/Anxiety 2) Improve Positive self talk      Client Statement of Needs: "Find ways of being nicer to myself", cope w/ anxiety and worry    Treatment Level:Outpatient Counseling  Symptoms:Anxiety, Perseverative worry, Negative self talk  Client Treatment Preferences:weekly counseling, return to Dr. [name] in Feb 2023 Prefers to be called Jill Alexander    Healthcare consumer's goal for treatment:   [name], [facility] will support the patient's ability to achieve the goals identified. Cognitive Behavioral Therapy, Supportive Counseling, Mindfulness and Relaxation  Strategies and other evidenced-based practices will be used to promote progress towards healthy functioning.    Healthcare consumer will: Actively participate in therapy, working towards healthy functioning.     *Justification for Continuation/Discontinuation of Goal: R=Revised, O=Ongoing, A=Achieved, D=Discontinued   Goal 1) Jill Alexander will develop strategies to regulate her emotions and improve her positive self talk Likert rating baseline date 04/12/21: How Easy - 3; How Often - 50% Target Date Goal Was reviewed Status Code Progress towards goal/Likert rating  04/12/22   New Maintained                      Jill Alexander participated in development of tx plan and provided verbal consent.              Myrtie Cruise, PhD              Myrtie Cruise, PhD

## 2022-01-29 NOTE — Telephone Encounter (Signed)
D:  Pt's mother phoned re: MH-IOP vs. PHP vs. [place] for pt. According to mother, pt hasn't been doing well emotionally.  Reports pt has been calling the crisis phone number and stating she has SI.  Apparently, pt told her mother that she had Advil in her hand but threw them down the sink.  A:  Discussed inpatient hospitalization with pt's mother.  Mrs. Morrish states pt doesn't want to go inpatient at this time.  "I don't feel she needs that at this time, but needs a group."  Informed Mrs. Gurr that pt needs a higher level than MH-IOP at this time.  Mrs. Dorner states she had already left a vm for PHP also.  Inform PHP team.

## 2022-01-29 NOTE — Telephone Encounter (Signed)
Pt called stating that the increase in Latuda to 40 mg is not working for her. Pt says she is still depressed and does have fleeting s.I. did not verbalize a plan. Pt is also still interested in [facility]. Pt next scheduled appointment is on 03/14/22. Should she been seen earlier? Please review and advise.

## 2022-01-30 ENCOUNTER — Telehealth (HOSPITAL_COMMUNITY): Payer: Self-pay | Admitting: Professional

## 2022-01-30 ENCOUNTER — Institutional Professional Consult (permissible substitution): Payer: No Typology Code available for payment source | Admitting: Neurology

## 2022-01-30 ENCOUNTER — Telehealth: Payer: Self-pay | Admitting: Neurology

## 2022-01-30 NOTE — Telephone Encounter (Signed)
Cln called pt's mother per mother's msg request and via Rita C. Cln maintained HIPPA and provided general information about PHP. Pt's mother states pt has completed DBT 2x with [facility], in on the Autism Spectrum, and is having some passive SI. Pt does not have friends and is currently not working. Cln shared information for [employer]. Pt is known to this therapists as pt has completed PHP before. Cln speaks directly to pt. Pt denies intent/plan. Pt denies HI. Pt reports she feels doing PHP again could be beneficial due to needing a refresher on coping skills. Pt will be going out of town for brother's wedding and wants to schedule her assessment to be able to start PHP when she returns. Cln agrees. Mother and pt denies safety concerns. Pt understands to go to the local ED or call 911 if safety concerns arise. Pt scheduled. Pt email [email or web address]

## 2022-01-30 NOTE — Telephone Encounter (Signed)
LVM and sent mychart msg informing pt of need to reschedule 10/4 appointment - MD out

## 2022-01-31 ENCOUNTER — Other Ambulatory Visit (HOSPITAL_COMMUNITY): Payer: Self-pay

## 2022-01-31 ENCOUNTER — Other Ambulatory Visit: Payer: Self-pay | Admitting: Neurology

## 2022-01-31 MED ORDER — TOPIRAMATE 50 MG PO TABS
150.0000 mg | ORAL_TABLET | Freq: Every day | ORAL | 0 refills | Status: DC
  Filled 2022-01-31 – 2022-02-04 (×2): qty 270, 90d supply, fill #0

## 2022-01-31 NOTE — Telephone Encounter (Signed)
Rx refilled.

## 2022-02-01 ENCOUNTER — Other Ambulatory Visit (HOSPITAL_COMMUNITY): Payer: Self-pay

## 2022-02-04 ENCOUNTER — Other Ambulatory Visit (HOSPITAL_COMMUNITY): Payer: Self-pay

## 2022-02-05 ENCOUNTER — Ambulatory Visit (INDEPENDENT_AMBULATORY_CARE_PROVIDER_SITE_OTHER): Payer: No Typology Code available for payment source | Admitting: Clinical

## 2022-02-05 DIAGNOSIS — F840 Autistic disorder: Secondary | ICD-10-CM | POA: Diagnosis not present

## 2022-02-05 NOTE — Progress Notes (Signed)
Time: 11:03-11:58 am CPT Code: [id number] Diagnosis: F84.0  Jill Alexander was seen in person for individual therapy. She and her mother had communicated with the therapist via secure email to let her know that Jill Alexander has plans to complete an intake for the Partial Hospitalization Program on 10/11, with the plan to start on 10/16. Her parents are aware of suicidal ideation and have hidden all medications. They also indicated that Jill Alexander had contacted [facility] and planned to discuss initiating [place] during her intake for PHP on 10/11. Jill Alexander reported continuing suicidal ideation without a plan, but intent of the form of continuing thoughts of wanting to die. Therapist queried whether her deterioration in mood may relate to her brother's upcoming wedding the weekend of 10/12, and Jill Alexander agreed that this had been challenging for her. She indicated a plan to let the therapist know once she starts in PHP. Therapist will hold all appointments until more intensive services have been arranged.       [facility] Counselor/Therapist Progress Note   Patient ID: Jill Alexander, MRN: [id number],     Individualized Treatment Plan Strengths: Enjoys reading, Seeking counseling, In training program through [facility], "I like humor and jokes"  Supports: Her dog, her bestfriend since childhood, her parents, her sister    Goal/Needs for Treatment:  In order of importance to patient 1) Emotional Regulation/Anxiety 2) Improve Positive self talk      Client Statement of Needs: "Find ways of being nicer to myself", cope w/ anxiety and worry    Treatment Level:Outpatient Counseling  Symptoms:Anxiety, Perseverative worry, Negative self talk  Client Treatment Preferences:weekly counseling, return to Dr. [name] in Feb 2023 Prefers to be called Jill Alexander    Healthcare consumer's goal for treatment:   Jill Alexander, Jill Alexander will support the patient's ability to achieve the goals identified. Cognitive Behavioral Therapy,  Supportive Counseling, Mindfulness and Relaxation Strategies and other evidenced-based practices will be used to promote progress towards healthy functioning.    Healthcare consumer will: Actively participate in therapy, working towards healthy functioning.     *Justification for Continuation/Discontinuation of Goal: R=Revised, O=Ongoing, A=Achieved, D=Discontinued   Goal 1) Jill Alexander will develop strategies to regulate her emotions and improve her positive self talk Likert rating baseline date 04/12/21: How Easy - 3; How Often - 50% Target Date Goal Was reviewed Status Code Progress towards goal/Likert rating  04/12/22   New Maintained                      Jill Alexander participated in development of tx plan and provided verbal consent.                  Myrtie Cruise, PhD               Myrtie Cruise, PhD

## 2022-02-06 ENCOUNTER — Encounter (HOSPITAL_COMMUNITY): Payer: Self-pay

## 2022-02-06 ENCOUNTER — Telehealth (HOSPITAL_COMMUNITY): Payer: Self-pay | Admitting: Licensed Clinical Social Worker

## 2022-02-06 ENCOUNTER — Other Ambulatory Visit (HOSPITAL_COMMUNITY): Payer: Medicaid Other | Attending: Psychiatry | Admitting: Licensed Clinical Social Worker

## 2022-02-06 DIAGNOSIS — F840 Autistic disorder: Secondary | ICD-10-CM

## 2022-02-06 DIAGNOSIS — F411 Generalized anxiety disorder: Secondary | ICD-10-CM | POA: Insufficient documentation

## 2022-02-06 DIAGNOSIS — F332 Major depressive disorder, recurrent severe without psychotic features: Secondary | ICD-10-CM | POA: Insufficient documentation

## 2022-02-06 NOTE — Psych (Signed)
Virtual Visit via Video Note  I connected with Jill Alexander on 02/06/22 at 10:00 AM EDT by a video enabled telemedicine application and verified that I am speaking with the correct person using two identifiers.  Location: Patient: pt's home Provider: clinical office   I discussed the limitations of evaluation and management by telemedicine and the availability of in person appointments. The patient expressed understanding and agreed to proceed.   I discussed the assessment and treatment plan with the patient. The patient was provided an opportunity to ask questions and all were answered. The patient agreed with the plan and demonstrated an understanding of the instructions.   The patient was advised to call back or seek an in-person evaluation if the symptoms worsen or if the condition fails to improve as anticipated.  I provided 70 minutes of non-face-to-face time during this encounter.   Heron Nay, [place]   Comprehensive Clinical Assessment (CCA) Note  02/06/2022 Jill Alexander [id number]  Chief Complaint:  Chief Complaint  Patient presents with   Suicidal   Anxiety   Depression   Visit Diagnosis: MDD, GAD, ASD    CCA Screening, Triage and Referral (STR)  Patient Reported Information How did you hear about [place]? Self  Referral name: No data recorded Referral phone number: No data recorded  Whom do you see for routine medical problems? No data recorded Practice/Facility Name: No data recorded Practice/Facility Phone Number: No data recorded Name of Contact: No data recorded Contact Number: No data recorded Contact Fax Number: No data recorded Prescriber Name: No data recorded Prescriber Address (if known): No data recorded  What Is the Reason for Your Visit/Call Today? SI  How Long Has This Been Causing You Problems? 1 wk - 1 month  What Do You Feel Would Help You the Most Today? Treatment for Depression or other mood problem   Have You  Recently Been in Any Inpatient Treatment (Hospital/Detox/Crisis Center/28-Day Program)? No  Name/Location of Program/Hospital:No data recorded How Long Were You There? No data recorded When Were You Discharged? No data recorded  Have You Ever Received Services From [facility] Before? Yes  Who Do You See at [facility]? No data recorded  Have You Recently Had Any Thoughts About Hurting Yourself? Yes  Are You Planning to Commit Suicide/Harm Yourself At This time? No   Have you Recently Had Thoughts About [place]? No  Explanation: No data recorded  Have You Used Any Alcohol or Drugs in the Past 24 Hours? No  How Long Ago Did You Use Drugs or Alcohol? No data recorded What Did You Use and How Much? No data recorded  Do You Currently Have a Therapist/Psychiatrist? Yes  Name of Therapist/Psychiatrist: [facility] for therapy and [facility] for med man   Have You Been Recently Discharged From Any Office Practice or Programs? No  Explanation of Discharge From Practice/Program: No data recorded    CCA Screening Triage Referral Assessment Type of Contact: Tele-Assessment  Is this Initial or Reassessment? No data recorded Date Telepsych consult ordered in [facility]:  No data recorded Time Telepsych consult ordered in [facility]:  No data recorded  Patient Reported Information Reviewed? No data recorded Patient Left Without Being Seen? No data recorded Reason for Not Completing Assessment: No data recorded  Collateral Involvement: chart review   Does Patient Have a [place]? No Name and Contact of Legal Guardian: No data recorded If Minor and Not Living with Parent(s), Who has  Custody? No data recorded Is CPS involved or ever been involved? Never  Is APS involved or ever been involved? Never   Patient Determined To Be At Risk for Harm To Self or Others Based on Review of Patient Reported Information or Presenting Complaint?  No  Method: No data recorded Availability of Means: No data recorded Intent: No data recorded Notification Required: No data recorded Additional Information for Danger to Others Potential: No data recorded Additional Comments for Danger to Others Potential: No data recorded Are There Guns or Other Weapons in Your Home? No data recorded Types of Guns/Weapons: No data recorded Are These Weapons Safely Secured?                            No data recorded Who Could Verify You Are Able To Have These Secured: No data recorded Do You Have any Outstanding Charges, Pending Court Dates, Parole/Probation? No data recorded Contacted To Inform of Risk of Harm To Self or Others: No data recorded  Location of Assessment: Other (comment)   Does Patient Present under Involuntary Commitment? No  IVC Papers Initial File Date: No data recorded  [place] of Residence: [place]   Patient Currently Receiving the Following Services: Medication Management; Individual Therapy   Determination of Need: Routine (7 days)   Options For Referral: Partial Hospitalization     CCA Biopsychosocial Intake/Chief Complaint:  Jill Alexander is a 26yo female self-referred to [facility] for SI and worsening depression and anxiety. She reports that two weeks ago, she almost attempted suicide via intentional overdose but instead disclosed her thoughts to her therapist and support system. She states her therapist discussed safety planning with her parents. She cites her stressors as low self-confidence/self-esteem and her relationship with her mother. When asked about ADLs, she states, "Off and on I guess. Sometimes I have difficulty making myself do some of those things. Sometimes I will go a few days without showering." When asked if she experiences panic attacks, she describes them as anxiety attacks and reports "it's been pretty frequent. At least once a day, but then not every day. But it's been daily for quite a while.  But maybe they petered out for a while." She describes them as hyperventilating. "My mom and my dad will be like 'Hey, are you okay? Breathe.'" She currently engages in outpatient therapy and medication management within [facility]. She previously completed PHP and engaged in IOP within [facility] in July 2020. She reports she completed another PHP while living in [place] and engaged in residential treatment in [place] from August-October in 2020. She denies hospitalizations, suicide attempts, NSSI, HI, and substance use. She has been diagnosed with ASD, bipolar 2 disorder, GAD, and MDD. She reports she last experienced yesterday, denies plan and intent and denies current SI. When asked about AVH, she states, "Sometimes I think I hear people calling my name when they're not. I don't know if that's a hallucination. No." She reports her father and sister have depression. She cites her parents, friend, and other family members as her supports. She currently lives with her parents and states there are no firearms in her home. When asked about current medical diagnoses, she states, "Not that I know of." Per chart review, pt has been diagnosed with chronic headaches, migraines, Chiari malformation, seizures, and GERD. Pt's often closes her eyes while speaking, provides excessive details when responding to questions despite multiple attempts at redirection, demonstrates poor insight at  times, and her speech is noticeably slowed.  Current Symptoms/Problems: SI (denies current), decrease in appetite, weight loss (more than 10 lbs in the past six months, unsure of within the past three months), sleeping too much (averaging 10 hours per day),   Patient Reported Schizophrenia/Schizoaffective Diagnosis in Past: No   Strengths: motivated for tx  Preferences: PHP  Abilities: able to engage in tx   Type of Services Patient Feels are Needed: improvement in functioning and reduction in symptoms   Initial  Clinical Notes/Concerns: Pt reports hx of ASD. Pt is receptive to redirection but provides excessive details despite redirection.   Mental Health Symptoms Depression:   Change in energy/activity; Difficulty Concentrating; Fatigue; Hopelessness; Increase/decrease in appetite; Irritability; Sleep (too much or little); Weight gain/loss; Tearfulness; Worthlessness   Duration of Depressive symptoms:  Greater than two weeks   Mania:   Racing thoughts   Anxiety:    Difficulty concentrating; Fatigue; Irritability; Restlessness; Worrying   Psychosis:   None   Duration of Psychotic symptoms: No data recorded  Trauma:   None   Obsessions:   Recurrent & persistent thoughts/impulses/images (states she ruminates)   Compulsions:   None   Inattention:   Does not seem to listen; Fails to pay attention/makes careless mistakes; Forgetful; Loses things; Poor follow-through on tasks; Symptoms before age 66; Symptoms present in 2 or more settings; Disorganized; Avoids/dislikes activities that require focus   Hyperactivity/Impulsivity:   None   Oppositional/Defiant Behaviors:   None   Emotional Irregularity:   Recurrent suicidal behaviors/gestures/threats; Unstable self-image   Other Mood/Personality Symptoms:  No data recorded   Mental Status Exam Appearance and self-care  Stature:   Average   Weight:   Average weight   Clothing:   Disheveled; Casual   Grooming:   Normal   Cosmetic use:   None   Posture/gait:   Other (Comment) (unable to assess due to camera angle. Cln only able to see pt's head and face)   Motor activity:   Not Remarkable   Sensorium  Attention:   Normal   Concentration:   Variable   Orientation:   X5   Recall/memory:   Normal   Affect and Mood  Affect:   Blunted   Mood:   Depressed   Relating  Eye contact:   Fleeting   Facial expression:   Depressed   Attitude toward examiner:   Cooperative   Thought and Language  Speech  flow:  Slow   Thought content:   Appropriate to Mood and Circumstances   Preoccupation:   None   Hallucinations:   None   Organization:  circumstantial; goal-directed  [employer] of Knowledge:   Average   Intelligence:   Average   Abstraction:   Normal   Judgement:   Fair   Reality Testing:   Adequate   Insight:   Flashes of insight   Decision Making:   Only simple   Social Functioning  Social Maturity:   Isolates   Social Judgement:   Naive   Stress  Stressors:   Family conflict; Illness   Coping Ability:   Deficient supports; Exhausted   Skill Deficits:   Activities of daily living; Responsibility; Self-care; Interpersonal   Supports:   Family; Friends/Service system     Religion: Religion/Spirituality Are You A Religious Person?: No  Leisure/Recreation: Leisure / Recreation Do You Have Hobbies?:  (not assessed due to time constraints)  Exercise/Diet: Exercise/Diet Do You Exercise?:  (not assessed due to time constraints)  Have You Gained or Lost A Significant Amount of Weight in the Past Six Months?: Yes-Lost Number of Pounds Lost?: 10 Do You Follow a Special Diet?: No Do You Have Any Trouble Sleeping?: No   CCA Employment/Education Employment/Work Situation: Employment / Work Situation Employment Situation: Employed Where is Patient Currently Employed?: works part time at a [employer] has Patient Been Employed?: 4 months Has Patient ever Been in Passenger transport manager?: No  Education: Education Is Patient Currently Attending School?: No Did Teacher, adult education From [employer]?: Yes Did Physicist, medical?: Yes What Type of College Degree Do you Have?: Pt worked towards 2 year degree; did not complete Did Heritage manager?: No Did You Have Any Difficulty At [employer]?: Yes   CCA Family/Childhood History Family and Relationship History: Family history Marital status: Single Are you sexually active?:  (not  assessed due to time constraints) Does patient have children?: No  Childhood History:  Childhood History By whom was/is the patient raised?: Both parents Additional childhood history information: moved frequently, lived internationally in [place] and [place]. Description of patient's relationship with caregiver when they were a child: Patient reports good relationship with both parents. Patient's description of current relationship with people who raised him/her: parents are supportive Does patient have siblings?: Yes Number of Siblings: 2 Description of patient's current relationship with siblings: older sister and brother. "Fine. I'm the youngest." Did patient suffer any verbal/emotional/physical/sexual abuse as a child?: No Did patient suffer from severe childhood neglect?: No Has patient ever been sexually abused/assaulted/raped as an adolescent or adult?: No Was the patient ever a victim of a crime or a disaster?: No Witnessed domestic violence?: No Has patient been affected by domestic violence as an adult?: No  Child/Adolescent Assessment:     CCA Substance Use Alcohol/Drug Use: Alcohol / Drug Use History of alcohol / drug use?: No history of alcohol / drug abuse                         ASAM's:  Six Dimensions of Multidimensional Assessment  Dimension 1:  Acute Intoxication and/or Withdrawal Potential:      Dimension 2:  Biomedical Conditions and Complications:      Dimension 3:  Emotional, Behavioral, or Cognitive Conditions and Complications:     Dimension 4:  Readiness to Change:     Dimension 5:  Relapse, Continued use, or Continued Problem Potential:     Dimension 6:  Recovery/Living Environment:     ASAM Severity Score:    ASAM Recommended Level of Treatment:     Substance use Disorder (SUD)    Recommendations for Services/Supports/Treatments:    DSM5 Diagnoses: Patient Active Problem List   Diagnosis Date Noted   GAD (generalized anxiety  disorder) 02/06/2022   MDD (major depressive disorder), recurrent severe, without psychosis ([place]) 02/06/2022   Women's annual routine gynecological examination 12/25/2021   Encounter for birth control pills maintenance 11/22/2021   History of Chiari malformation 09/26/2021   Urinary urgency 09/26/2021   Common migraine with intractable migraine 07/01/2019   Intermittent headache 10/26/2018   Epigastric pain 10/26/2018   Gastroesophageal reflux disease 10/26/2018   Bipolar 2 disorder ([place]) 06/02/2018   Panic attacks 06/02/2018   Autism spectrum disorder 05/06/2018   Anxiety and depression 05/06/2018   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   Seizures ([place]) 04/09/2017   Tremor, 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 Cln advised therapist of admission to [facility] via secure chat  Patient/Guardian was advised Release of Information must be obtained prior to any record release in order to collaborate their care with an outside provider. Patient/Guardian was advised if they have not already done so to contact the registration department to sign all necessary forms in order for [place] to release information regarding their care.   Consent: Patient/Guardian gives verbal consent for treatment and assignment of benefits for services provided during this visit. Patient/Guardian expressed understanding and agreed to proceed.   Heron Nay, LCSWA

## 2022-02-06 NOTE — Plan of Care (Signed)
  Problem: Depression CCP Problem  1 Learn and Apply Coping Skills to Decrease Depression Symptoms   Goal: LTG: Jill "[name]" WILL SCORE LESS THAN 10 ON THE PATIENT HEALTH QUESTIONNAIRE (PHQ-9) Outcome: Not Applicable Goal: STG: Jill "[name]" WILL ATTEND AT LEAST 80% OF SCHEDULED PHP SESSIONS Outcome: Not Applicable Goal: STG: Jill "[name]" WILL ATTEND AT LEAST 80% OF SCHEDULED GROUP PSYCHOTHERAPY SESSIONS Outcome: Not Applicable Goal: STG: Jill "[name]" WILL COMPLETE AT LEAST 80% OF ASSIGNED HOMEWORK Outcome: Not Applicable

## 2022-02-11 ENCOUNTER — Other Ambulatory Visit (HOSPITAL_COMMUNITY): Payer: No Typology Code available for payment source | Attending: Psychiatry

## 2022-02-11 ENCOUNTER — Other Ambulatory Visit (HOSPITAL_COMMUNITY)
Payer: No Typology Code available for payment source | Attending: Psychiatry | Admitting: Licensed Clinical Social Worker

## 2022-02-11 ENCOUNTER — Encounter (HOSPITAL_COMMUNITY): Payer: Self-pay

## 2022-02-11 DIAGNOSIS — F410 Panic disorder [episodic paroxysmal anxiety] without agoraphobia: Secondary | ICD-10-CM

## 2022-02-11 DIAGNOSIS — R4589 Other symptoms and signs involving emotional state: Secondary | ICD-10-CM | POA: Diagnosis present

## 2022-02-11 DIAGNOSIS — F332 Major depressive disorder, recurrent severe without psychotic features: Secondary | ICD-10-CM | POA: Insufficient documentation

## 2022-02-11 DIAGNOSIS — F840 Autistic disorder: Secondary | ICD-10-CM | POA: Diagnosis not present

## 2022-02-11 DIAGNOSIS — F411 Generalized anxiety disorder: Secondary | ICD-10-CM

## 2022-02-11 NOTE — Therapy (Signed)
[place], [place], [address] Phone: [phone]   Fax:  [phone]  Occupational Therapy Evaluation  Virtual Visit via Video Note  I connected with [name] on 02/11/22 at  8:00 AM EDT by a video enabled telemedicine application and verified that I am speaking with the correct person using two identifiers.  Location: Patient: home Provider: office   I discussed the limitations of evaluation and management by telemedicine and the availability of in person appointments. The patient expressed understanding and agreed to proceed.    The patient was advised to call back or seek an in-person evaluation if the symptoms worsen or if the condition fails to improve as anticipated.  I provided 85 minutes of non-face-to-face time during this encounter.   Patient Details  Name: Jill Alexander MRN: [id number] Date of Birth: [birth year] No data recorded  Encounter Date: 02/11/2022   OT End of Session - 02/11/22 1916     Visit Number 1    Number of Visits 20    Date for OT Re-Evaluation 03/13/22    OT Start Time 1000    OT Stop Time 1255   eval: 79; Tx: 55; total: 85   OT Time Calculation (min) 175 min    Activity Tolerance Patient tolerated treatment well    Behavior During Therapy WFL for tasks assessed/performed             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    Past Surgical History:  Procedure Laterality Date   none      There were no vitals filed for this visit.   Subjective Assessment - 02/11/22 1912     Subjective  I'd like to work on how to set better goals and habits, routines.    Pertinent History ASD, GAD, MDD     Limitations processing information    Patient Stated Goals to improve goal and routine creation and adherence    Currently in Pain? No/denies    Multiple Pain Sites No                OT Assessment  Diagnosis: ADS, MDD, GAD Past medical history/referral information: ASD Living situation: parents ADLs: independent Work: part-time Leisure: inhibited  Social support:  fair Struggles: goals, routines and interests OT goal:  To improve the psychosocial domains in deficit by time of DC / see STG  [place] Summary of Client Scores:  Facilitates participation in occupation Allows participation in occupation Inhibits participation in occupation Restricts participation in occupation Comments:  Roles   X    Habits    X   Personal Causation    X   Values X      Interests   X    Skills   X    [place]   X    Long-term Goals   X    Interpretation of Past Experiences   X    Physical Environment X      [place]   X    Readiness for Change   X      Need for Occupational Therapy:  4 Shows positive occupational participation, no need for OT.   3 Need for minimal intervention/consultative participation   2 Need for OT intervention indicated to restore/improve participation   1 Need for extensive OT intervention indicated to improve participation.  Referral for follow up services also recommended.   Assessment:  Patient demonstrates behavior that INHIBITS participation in occupation.  Patient will benefit from occupational therapy intervention in order to improve time management, financial management, stress management, job readiness skills, social skills, and health management skills in preparation to return to full time community living and to be a productive community member.    Plan:  Patient will participate in skilled occupational therapy sessions individually or in a group setting to improve coping skills, psychosocial skills, and emotional  skills required to return to prior level of function. Treatment will be 4-5 times per week for 4 weeks.      Group Session:   O: The objective of this presentation is to provide a comprehensive understanding of the concept of "motivation" and its role in human behavior and well-being. The content covers various theories of motivation, including intrinsic and extrinsic motivators, and explores the psychological mechanisms that drive individuals to achieve goals, overcome obstacles, and make decisions. By diving into real-world applications, the presentation aims to offer actionable strategies for enhancing motivation in different life domains, such as work, relationships, and personal growth. Utilizing a multi-disciplinary approach, this presentation integrates insights from psychology, neuroscience, and behavioral economics to present a holistic view of motivation. The objective is not only to educate the audience about the complexities and driving forces behind motivation but also to equip them with practical tools and techniques to improve their own motivation levels. By the end of the presentation, attendees should have a well-rounded understanding of what motivates human actions and how to harness this knowledge for personal and professional betterment.   A: The patient demonstrates a high level of engagement during the session, actively participating in discussions about motivation theories and their applicability to their own life. They show keen interest in learning new strategies to improve their motivation and even offer examples from their own experiences that align with the theories presented. Their level of self-awareness and willingness to invest in self-improvement suggest that they are well-positioned to benefit from the practical tools and techniques discussed. The patient's ability to articulate their goals and challenges further supports the likelihood of successfully implementing the  strategies presented.   P: Continue to attend PHP OT group sessions 5x week for 4 weeks to promote daily structure, social engagement, and opportunities to develop and utilize adaptive strategies to maximize functional performance in preparation for safe transition and integration back into school, work, and the community. Plan to address topic of pt 3 in next OT group session.               OT Education - 02/11/22 1914     Education Details OT Eval / Renelda Mom / Group Tx    Person(s) Educated Patient    Methods Verbal cues;Handout;Explanation    Comprehension Verbalized understanding              OT Short Term Goals - 02/11/22 1919       OT SHORT TERM GOAL #1   Title Pt will be educated on strategies to improve psychosocial skills needed to participate fully in all daily, work, and leisure activities    Time 4    Period Weeks    Status New  Target Date 03/13/22      OT SHORT TERM GOAL #2   Title Pt will apply psychosocial skills and coping mechanisms to daily activities in order to function independently and reintegrate into community dwelling      OT [place] #3   Title Pt will recall and/or apply 1-3 sleep hygiene strategies to improve BADL routine when reintegrating into community      OT SHORT TERM GOAL #4   Title Pt will engage in goal setting to improve functional BADL/IADL routine upon reintegrating into community      OT [place] #5   Title Pt will choose and/or engage in 1-3 socially engaging leisure activities to improve social participation upon re integrating into community                      Plan - 02/11/22 1917     Clinical Impression Statement Pt presents w/ deficits that inhibits several critical psychosocial domains that interfer w/ her overall ability to function in ADLs and iADLs    OT Occupational Profile and History Problem Focused Assessment - Including review of records relating to presenting problem     Occupational performance deficits (Please refer to evaluation for details): ADL's;IADL's;Rest and Sleep;Work;Leisure;Social Participation    Psychosocial Skills Coping Strategies;Habits;Interpersonal Interaction;Routines and Behaviors    Rehab Potential Good    Clinical Decision Making Limited treatment options, no task modification necessary    Comorbidities Affecting Occupational Performance: None    Modification or Assistance to Complete Evaluation  No modification of tasks or assist necessary to complete eval    OT Frequency 5x / week    OT Duration 4 weeks    OT Treatment/Interventions Psychosocial skills training;Coping strategies training;Patient/family education             Patient will benefit from skilled therapeutic intervention in order to improve the following deficits and impairments:       Psychosocial Skills: Coping Strategies, Habits, Interpersonal Interaction, Routines and Behaviors   Visit Diagnosis: Difficulty coping    Problem List Patient Active Problem List   Diagnosis Date Noted   GAD (generalized anxiety disorder) 02/06/2022   MDD (major depressive disorder), recurrent severe, without psychosis ([place]) 02/06/2022   Women's annual routine gynecological examination 12/25/2021   Encounter for birth control pills maintenance 11/22/2021   History of Chiari malformation 09/26/2021   Urinary urgency 09/26/2021   Common migraine with intractable migraine 07/01/2019   Intermittent headache 10/26/2018   Epigastric pain 10/26/2018   Gastroesophageal reflux disease 10/26/2018   Bipolar 2 disorder ([place]) 06/02/2018   Panic attacks 06/02/2018   Autism spectrum disorder 05/06/2018   Anxiety and depression 05/06/2018   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   Seizures ([place]) 04/09/2017   Tremor, 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    Brantley Stage, OT 02/11/2022, 7:21 PM   [facility], [facility] [place], [place], [address] Phone: [phone]   Fax:  [phone]  Name: Jacquette Yrigoyen MRN: [id number] Date of Birth: 12/07/1995

## 2022-02-11 NOTE — Psych (Cosign Needed)
Behavioral Health Partial Program Assessment Note  Date: 02/11/2022 Name: Jill Alexander MRN: [id number] Virtual Visit via Video Note  I connected with Jill Alexander on 02/11/22 at  9:00 AM EDT by a video enabled telemedicine application and verified that I am speaking with the correct person using two identifiers.  Location: Patient: Home Provider: Clinic I discussed the limitations of evaluation and management by telemedicine and the availability of in person appointments. The patient expressed understanding and agreed to proceed.   I discussed the assessment and treatment plan with the patient. The patient was provided an opportunity to ask questions and all were answered. The patient agreed with the plan and demonstrated an understanding of the instructions.   The patient was advised to call back or seek an in-person evaluation if the symptoms worsen or if the condition fails to improve as anticipated.  I provided 40 minutes of non-face-to-face time during this encounter.   Karsten Ro, MD  Chief Complaint: Worsening depression and anxiety  Subjective:   HPI: Patient is a 26 y.o. Caucasian female with past psychiatric history of MDD, autism spectrum disorder, GAD, OCD presents to [facility] with worsening depression and anxiety.  Patient was enrolled in partial psychiatric program on 02/11/22.  Patient states she has been struggling with her mental health for last several months.  She reports worsening of her depression and anxiety.  She sees Dr. Michae Kava and therapist Dr. Charlyne Mom regularly.  She was referred to [facility] by Dr. Michae Kava. Per CSW notes, pt  previously completed PHP and IOP in [place] in July 2020.  She completed another PHP while in [place] and also went to residential treatment in [place] from August-October in 2020.  She reports multiple stressors including recently started a new job 2 months ago, not having enough hours at work, a lot of panic  attacks daily, not getting along with her mom and getting overwhelmed with people and world in general.  She reports she tends to overreact and do not think before speaking.  She reports that she gets overwhelmed with her own thoughts and feelings and feels like the world is stressing her out.  She reports that she gets overwhelmed just being around people.  She endorses depressed mood, poor and disturbed sleep, poor appetite, anhedonia, fatigue, low energy, hopelessness, feeling guilty, decreased concentration, poor memory.  She denies any manic symptoms or episode including pressured speech, decreased need for sleep, hypersexuality, increased spending, racing thoughts, flight of ideas and grandiosity.  She report impulsiveness.   Currently, She denies active or passive Suicidal ideations, Homicidal ideations, auditory and visual hallucinations. She denies any paranoia.   She denies any history of physical, verbal, and sexual abuse. She reports generalized anxiety with daily panic attacks.  Discussed increasing Prozac to 80 mg daily.  Patient agrees with the plan.  Past Psychiatric Hx:  Previous Psych Diagnoses: MDD, autism, GAD, OCD Prior inpatient treatment: Denies.  Per Dr Trinda Pascal Eval from 11/08/21-Other treatments-DBT, residential treatment facility Aug-Oct 2020, PHP 10/2018, IOP 10/2018 Current meds: Latuda 40 mg nightly, Prozac 60 mg daily, Ativan 1 mg twice daily, Lamictal 200 mg twice daily( for seizure and depression) Psychotherapy hx: Dr. Jaymes Graff at [facility] behavioral health Previous suicidal attempts: Denies Previous medication trials: Tried multiple medications since age 50.  Does not remember much.  Per Dr. Lemar Lofty initial evaluation from 11/08/2021-Abilify, Wellbutrin, Lamictal, Ativan, Prozac, Nortriptyline, Xanax,Valium, Cariprazine, Lithium Current therapist: Dr. Charlyne Mom Current psychiatrist: Dr. Zenon Mayo Substance Abuse Hx:  Alcohol: Ocassionally  cocktails Tobacco:Denies Illicit drugs-Denies Rehab HF:[facility] Seizures, DUI's, DT's- h/o seizures  Past Medical History: Medical Diagnoses: Seizures, autism, migraine Home Rx: Topamax 150 mg for migraines, Lamictal 200 mg twice daily for seizure and depression H/o seizures: yes Allergies: Gluten  Family Psych History: Psych: Dad-depression Sister-depression and anxiety Grandmother-depression Grandfather-depression SA/HA: Denies  Social History: Marital Status: Single Children: None Employment: Works part-time at [employer]: Completed high school and 30 credits in college.  Studied liberal arts Housing: Lives with parents Guns: Denies Legal: Denies  Past Surgical History:  Procedure Laterality Date   none      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   Outpatient Encounter Medications as of 02/11/2022  Medication Sig Note   acetaminophen (TYLENOL) 500 MG tablet Take 500 mg by mouth every 6 (six) hours as needed. 07/01/2019: 07/01/19 usually take 2 tabs   Docusate Sodium (COLACE PO) Take by mouth.    drospirenone-ethinyl estradiol (YAZ) 3-0.02 MG tablet Take 1 tablet by mouth daily.    Erenumab-aooe (AIMOVIG) 140 MG/ML SOAJ Inject 140 mg into the skin every 30 (thirty) days.    Famotidine (PEPCID PO) Take by mouth. 07/01/2019: As needed   FLUoxetine (PROZAC) 20 MG capsule Take 3 capsules (60 mg total) by mouth daily.    Ibuprofen (MOTRIN PO) Take 200 mg by mouth as needed. 07/01/2019: 07/01/19 usually takes 2 tabs   lamoTRIgine (LAMICTAL) 200 MG tablet Take 1 tablet (200 mg total) by mouth 2 (two) times daily for seizures and depression.    LORazepam (ATIVAN) 1 MG tablet Take 1 tablet (1 mg total) by mouth 2 (two) times daily as  needed for anxiety.    lurasidone (LATUDA) 40 MG TABS tablet Take 1 tablet (40 mg total) by mouth daily with breakfast.    melatonin 1 MG TABS tablet Take 1 tablet (1 mg total) by mouth at bedtime as needed (insomnia).    mirabegron ER (MYRBETRIQ) 50 MG TB24 tablet Take 1 tablet (50 mg total) by mouth daily.    Multiple Vitamins-Minerals (MULTIVITAMIN PO) Take 1 tablet by mouth daily.    omeprazole (PRILOSEC) 20 MG capsule Take 1 capsule (20 mg total) by mouth daily. 09/25/2021: As needed   polyethylene glycol (MIRALAX) packet Take 17 g by mouth daily as needed. 12/02/2018: prn   rizatriptan (MAXALT) 10 MG tablet Take 1 tablet (10 mg total) by mouth as needed for migraine. May repeat in 2 hours if needed.    topiramate (TOPAMAX) 50 MG tablet Take 3 tablets (150 mg total) by mouth at bedtime.    [DISCONTINUED] Norethin Ace-Eth Estrad-FE (TAYTULLA) 1-20 MG-MCG(24) CAPS TAKE 1 CAPSULE BY MOUTH DAILY.    No facility-administered encounter medications on file as of 02/11/2022.   Allergies  Allergen Reactions   Gluten Meal     Social History   Tobacco Use   Smoking status: Never   Smokeless tobacco: Never  Substance Use Topics   Alcohol use: Yes    Comment: 1 cocktail a few times a year    Family History  Problem Relation Age of Onset   Depression Father    Depression Sister    Anxiety disorder Sister  Diabetes Maternal Grandfather    Cancer Maternal Grandfather    Breast cancer Paternal Grandmother    Uterine cancer Paternal Grandmother    Irritable bowel syndrome Paternal Grandmother    Colon cancer Neg Hx    Esophageal cancer Neg Hx    Stomach cancer Neg Hx    Rectal cancer Neg Hx      Review of Systems Negative Except what is mentioned in HPI  Objective:  There were no vitals filed for this visit.  Physical Exam: Did not do exam due to virtual visit  Mental Status Exam: Appearance:  Casually dressed Psychomotor::  Psychomotor Retardation Attention span and  concentration: Normal Behavior: calm, cooperative, and adequate rapport can be established Speech:  slow Mood:  depressed and anxious Affect:  mood-congruent Thought Process:  Coherent and Linear Thought Content:  Logical Orientation:  person, place, time/date, and situation Cognition:  grossly intact Insight:  Intact Judgment:  Intact Estimate of Intelligence: Average Fund of knowledge: Aware of current events Memory: Recent and remote intact Abnormal movements: None Gait and station: Not tested, due to virtual visit  Assessment: Diagnosis: MDD (major depressive disorder), recurrent severe, without psychosis ([place]) [F33.2] 1. MDD (major depressive disorder), recurrent severe, without psychosis ([place])   2. Panic attacks   3. Autism spectrum disorder   4. GAD (generalized anxiety disorder)     Indications for admission: inpatient care required if not in partial hospital program  Plan:Patient is a 26 y.o. Caucasian female with past psychiatric history of MDD, autism spectrum disorder, GAD presents to [facility] with worsening depression and anxiety.  Patient was enrolled in partial psychiatric program on 02/11/22.  patient enrolled in Partial Hospitalization Program and patient's current medications are to be continued with following changes. -Increase Prozac to 80 mg daily to help with depression and anxiety. -Continue Latuda 40 mg nightly -Continue Ativan 1 mg twice daily as needed for anxiety  Follow up next week. Collaboration of Care: Medication Management AEB Dr Marcello Fennel Notes, PHP team  Patient/Guardian was advised Release of Information must be obtained prior to any record release in order to collaborate their care with an outside provider. Patient/Guardian was advised if they have not already done so to contact the registration department to sign all necessary forms in order for [place] to release information regarding their care.   Consent: Patient/Guardian gives verbal consent  for treatment and assignment of benefits for services provided during this visit. Patient/Guardian expressed understanding and agreed to proceed.   Treatment options and alternatives reviewed with patient and patient understands the above plan.     Armando Reichert, MD

## 2022-02-12 ENCOUNTER — Encounter (HOSPITAL_COMMUNITY): Payer: Self-pay

## 2022-02-12 ENCOUNTER — Other Ambulatory Visit (HOSPITAL_COMMUNITY): Payer: No Typology Code available for payment source | Admitting: Licensed Clinical Social Worker

## 2022-02-12 ENCOUNTER — Other Ambulatory Visit (HOSPITAL_COMMUNITY): Payer: No Typology Code available for payment source

## 2022-02-12 ENCOUNTER — Ambulatory Visit: Payer: No Typology Code available for payment source | Admitting: Clinical

## 2022-02-12 DIAGNOSIS — F332 Major depressive disorder, recurrent severe without psychotic features: Secondary | ICD-10-CM | POA: Diagnosis not present

## 2022-02-12 DIAGNOSIS — F840 Autistic disorder: Secondary | ICD-10-CM

## 2022-02-12 DIAGNOSIS — F411 Generalized anxiety disorder: Secondary | ICD-10-CM

## 2022-02-12 DIAGNOSIS — R4589 Other symptoms and signs involving emotional state: Secondary | ICD-10-CM | POA: Diagnosis not present

## 2022-02-12 NOTE — Progress Notes (Signed)
Spoke with patient via Webex video call, used 2 identifiers to correctly identify patient. States that groups are going OK. She was referred by her therapist. She did the program in the past but needed more therapy to help with her depression. She started a new job about 5 months ago but doesn't work but 2 days a week, also started new medication 1 month ago. Has been having suicidal thoughts with no plan or intent. Feels she has no structure in her life and just feels unhappy. Sleeping a lot during the day and not going on walks anymore. She does not drive. Denies SI/HI or AV hallucinations. On scale 1-10 as 10 being worst she rates depression at 10 and anxiety at 10. PHQ9=26. No side effects from medications. No issues or complaints.

## 2022-02-12 NOTE — Therapy (Signed)
[facility] PARTIAL HOSPITALIZATION PROGRAM [address] [place], [place], [address] Phone: [phone]   Fax:  [phone]  Occupational Therapy Treatment Virtual Visit via Video Note  I connected with [name] on 02/12/22 at  8:00 AM EDT by a video enabled telemedicine application and verified that I am speaking with the correct person using two identifiers.  Location: Patient: home Provider: office   I discussed the limitations of evaluation and management by telemedicine and the availability of in person appointments. The patient expressed understanding and agreed to proceed.    The patient was advised to call back or seek an in-person evaluation if the symptoms worsen or if the condition fails to improve as anticipated.  I provided 55 minutes of non-face-to-face time during this encounter.  Patient Details  Name: Jill Alexander MRN: [id number] Date of Birth: [birth year] No data recorded  Encounter Date: 02/12/2022   OT End of Session - 02/12/22 2110     Visit Number 2    Number of Visits 20    Date for OT Re-Evaluation 03/13/22    OT Start Time 1200    OT Stop Time 1255    OT Time Calculation (min) 55 min             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    Past Surgical History:  Procedure Laterality Date   none      There were no vitals filed for this visit.   Subjective Assessment - 02/12/22 2108     Currently in Pain? No/denies    Pain Score 0-No pain               Group Session:  S: doing better today  O: The objective of this presentation is to provide a comprehensive understanding of the concept of "motivation" and its role in human behavior and  well-being. The content covers various theories of motivation, including intrinsic and extrinsic motivators, and explores the psychological mechanisms that drive individuals to achieve goals, overcome obstacles, and make decisions. By diving into real-world applications, the presentation aims to offer actionable strategies for enhancing motivation in different life domains, such as work, relationships, and personal growth. Utilizing a multi-disciplinary approach, this presentation integrates insights from psychology, neuroscience, and behavioral economics to present a holistic view of motivation. The objective is not only to educate the audience about the complexities and driving forces behind motivation but also to equip them with practical tools and techniques to improve their own motivation levels. By the end of the presentation, attendees should have a well-rounded understanding of what motivates human actions and how to harness this knowledge for personal and professional betterment.   A:  The patient demonstrates a high level of engagement during the session, actively participating in discussions about motivation theories and their applicability to their own life. They show keen interest in learning new strategies to improve their motivation and even offer examples from their own experiences that align with the theories presented. Their level of self-awareness and willingness to invest in self-improvement suggest that they are well-positioned to benefit from the practical tools and techniques discussed. The patient's ability to  articulate their goals and challenges further supports the likelihood of successfully implementing the strategies presented.     P: Continue to attend PHP OT group sessions 5x week for 4 weeks to promote daily structure, social engagement, and opportunities to develop and utilize adaptive strategies to maximize functional performance in preparation for safe transition and  integration back into school, work, and the community. Plan to address topic of tbd in next OT group session.                    OT Education - 02/12/22 2109     Education Details Specific Strategies to Improve Motivation Based on the Degree of Symptom Severity 4              OT Short Term Goals - 02/11/22 1919       OT SHORT TERM GOAL #1   Title Pt will be educated on strategies to improve psychosocial skills needed to participate fully in all daily, work, and leisure activities    Time 4    Period Weeks    Status New    Target Date 03/13/22      OT SHORT TERM GOAL #2   Title Pt will apply psychosocial skills and coping mechanisms to daily activities in order to function independently and reintegrate into community dwelling      OT [place] #3   Title Pt will recall and/or apply 1-3 sleep hygiene strategies to improve BADL routine when reintegrating into community      OT SHORT TERM GOAL #4   Title Pt will engage in goal setting to improve functional BADL/IADL routine upon reintegrating into community      OT [place] #5   Title Pt will choose and/or engage in 1-3 socially engaging leisure activities to improve social participation upon re integrating into community                       Patient will benefit from skilled therapeutic intervention in order to improve the following deficits and impairments:           Visit Diagnosis: Autism spectrum disorder    Problem List Patient Active Problem List   Diagnosis Date Noted   GAD (generalized anxiety disorder) 02/06/2022   MDD (major depressive disorder), recurrent severe, without psychosis ([place]) 02/06/2022   Women's annual routine gynecological examination 12/25/2021   Encounter for birth control pills maintenance 11/22/2021   History of Chiari malformation 09/26/2021   Urinary urgency 09/26/2021   Common migraine with intractable migraine 07/01/2019   Intermittent  headache 10/26/2018   Epigastric pain 10/26/2018   Gastroesophageal reflux disease 10/26/2018   Bipolar 2 disorder ([place]) 06/02/2018   Panic attacks 06/02/2018   Autism spectrum disorder 05/06/2018   Anxiety and depression 05/06/2018   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   Seizures ([place]) 04/09/2017   Tremor, 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    Brantley Stage, OT 02/12/2022, 9:11 PM  [facility], [place], [place], [address] Phone: [phone]   Fax:  [phone]  Name: Nieve Rojero MRN: [id number] Date of Birth: 1995-10-13

## 2022-02-13 ENCOUNTER — Other Ambulatory Visit (HOSPITAL_COMMUNITY): Payer: No Typology Code available for payment source | Admitting: Licensed Clinical Social Worker

## 2022-02-13 ENCOUNTER — Other Ambulatory Visit (HOSPITAL_COMMUNITY): Payer: No Typology Code available for payment source

## 2022-02-13 ENCOUNTER — Other Ambulatory Visit (HOSPITAL_COMMUNITY): Payer: Self-pay

## 2022-02-13 ENCOUNTER — Encounter (HOSPITAL_COMMUNITY): Payer: Self-pay

## 2022-02-13 ENCOUNTER — Telehealth (HOSPITAL_COMMUNITY): Payer: Self-pay | Admitting: Licensed Clinical Social Worker

## 2022-02-13 ENCOUNTER — Other Ambulatory Visit: Payer: Self-pay | Admitting: Neurology

## 2022-02-13 DIAGNOSIS — R4589 Other symptoms and signs involving emotional state: Secondary | ICD-10-CM

## 2022-02-13 DIAGNOSIS — F840 Autistic disorder: Secondary | ICD-10-CM

## 2022-02-13 DIAGNOSIS — F332 Major depressive disorder, recurrent severe without psychotic features: Secondary | ICD-10-CM

## 2022-02-13 DIAGNOSIS — F411 Generalized anxiety disorder: Secondary | ICD-10-CM

## 2022-02-13 NOTE — Therapy (Signed)
[facility] [place], [place], [address] Phone: [phone]   Fax:  [phone]  Occupational Therapy Treatment Virtual Visit via Video Note  I connected with [name] on 02/13/22 at  8:00 AM EDT by a video enabled telemedicine application and verified that I am speaking with the correct person using two identifiers.  Location: Patient: home Provider: office   I discussed the limitations of evaluation and management by telemedicine and the availability of in person appointments. The patient expressed understanding and agreed to proceed.    The patient was advised to call back or seek an in-person evaluation if the symptoms worsen or if the condition fails to improve as anticipated.  I provided 60 minutes of non-face-to-face time during this encounter.   Patient Details  Name: Jill Alexander MRN: [id number] Date of Birth: [birth year] No data recorded  Encounter Date: 02/13/2022   OT End of Session - 02/13/22 2225     Visit Number 3    Number of Visits 20    Date for OT Re-Evaluation 03/13/22    OT Start Time 1100    OT Stop Time 1200    OT Time Calculation (min) 60 min             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    Past Surgical History:  Procedure Laterality Date   none      There were no vitals filed for this visit.   Subjective Assessment - 02/13/22 2225     Currently in Pain? No/denies    Pain Score 0-No pain                Group Session:  S: doing better today  O: Today's OT group aims to explore the intricate relationship between chronic pain, depression, and the resultant impact on Activities of Daily  Living (ADLs) and Instrumental Activities of Daily Living (iADLs). Through a multidisciplinary lens, it delves into common causes and symptoms, offering occupational therapy-aligned strategies for symptom improvement without medication. Among these strategies are targeted core and lower extremity strengthening exercises to mitigate low back pain due to anterior pelvic tilt, as well as granular communication techniques for patients to effectively express their chronic pain symptoms to healthcare providers. The emphasis is on non-pharmacological, adaptive approaches to improve mental health and functional performance, providing both healthcare providers and patients a well-rounded understanding of managing chronic pain and depression.   A:  The patient was highly engaged throughout the presentation, demonstrating keen interest by actively participating in discussions and asking insightful questions regarding pain measurement scales and adaptive strategies for daily living. This level of engagement suggests a strong willingness to implement the discussed techniques in their own life, making them a good candidate for a tailored occupational therapy intervention plan aimed at addressing their specific challenges related to chronic pain and depression.    P: Continue to attend PHP OT group sessions 5x week for 4 weeks to promote daily structure, social engagement, and opportunities to develop and utilize adaptive strategies to maximize functional performance in preparation for safe transition and integration back into school, work, and the community.  Plan to address topic of pain 2 in next OT group session.                   OT Education - 02/13/22 2225     Education Details Pain Mgmt              OT Short Term Goals - 02/11/22 1919       OT SHORT TERM GOAL #1   Title Pt will be educated on strategies to improve psychosocial skills needed to participate fully in all daily, work, and  leisure activities    Time 4    Period Weeks    Status New    Target Date 03/13/22      OT SHORT TERM GOAL #2   Title Pt will apply psychosocial skills and coping mechanisms to daily activities in order to function independently and reintegrate into community dwelling      OT SHORT TERM GOAL #3   Title Pt will recall and/or apply 1-3 sleep hygiene strategies to improve BADL routine when reintegrating into community      OT SHORT TERM GOAL #4   Title Pt will engage in goal setting to improve functional BADL/IADL routine upon reintegrating into community      OT SHORT TERM GOAL #5   Title Pt will choose and/or engage in 1-3 socially engaging leisure activities to improve social participation upon re integrating into community                      Plan - 02/13/22 2226     Psychosocial Skills Coping Strategies;Habits;Interpersonal Interaction;Routines and Behaviors             Patient will benefit from skilled therapeutic intervention in order to improve the following deficits and impairments:       Psychosocial Skills: Coping Strategies, Habits, Interpersonal Interaction, Routines and Behaviors   Visit Diagnosis: Difficulty coping    Problem List Patient Active Problem List   Diagnosis Date Noted   GAD (generalized anxiety disorder) 02/06/2022   MDD (major depressive disorder), recurrent severe, 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   Women's annual routine gynecological examination 12/25/2021   Encounter for birth control pills maintenance 11/22/2021   History of Chiari malformation 09/26/2021   Urinary urgency 09/26/2021   Common migraine with intractable migraine 07/01/2019   Intermittent headache 10/26/2018   Epigastric pain 10/26/2018   Gastroesophageal reflux disease 10/26/2018   Bipolar 2 disorder (HCC) 06/02/2018   Panic attacks 06/02/2018   Autism spectrum disorder 05/06/2018   Anxiety and depression 05/06/2018   Mood disorder in conditions  classified elsewhere 01/29/2018   Seizures (HCC) 04/09/2017   Tremor, 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    Ted Mcalpine, OT 02/13/2022, 10:27 PM  Kerrin Champagne, OT   [facility] HOSPITALIZATION PROGRAM [address] [place], [place], [address] Phone: [phone]   Fax:  [phone]  Name: Jill Alexander MRN: [id number] Date of Birth: 12-Mar-1996

## 2022-02-14 ENCOUNTER — Telehealth (HOSPITAL_COMMUNITY): Payer: Medicaid Other | Admitting: Psychiatry

## 2022-02-14 ENCOUNTER — Other Ambulatory Visit (HOSPITAL_COMMUNITY): Payer: Self-pay

## 2022-02-14 ENCOUNTER — Telehealth: Payer: Self-pay | Admitting: Neurology

## 2022-02-14 ENCOUNTER — Other Ambulatory Visit (HOSPITAL_COMMUNITY): Payer: No Typology Code available for payment source

## 2022-02-14 ENCOUNTER — Encounter (HOSPITAL_COMMUNITY): Payer: Self-pay

## 2022-02-14 DIAGNOSIS — R4589 Other symptoms and signs involving emotional state: Secondary | ICD-10-CM

## 2022-02-14 MED ORDER — RIZATRIPTAN BENZOATE 10 MG PO TABS
10.0000 mg | ORAL_TABLET | ORAL | 11 refills | Status: DC | PRN
  Filled 2022-02-14: qty 10, 30d supply, fill #0
  Filled 2022-07-31: qty 10, 30d supply, fill #1
  Filled 2022-08-27 – 2022-08-29 (×2): qty 10, 30d supply, fill #2
  Filled 2022-09-25: qty 10, 30d supply, fill #3
  Filled 2022-10-02 – 2022-10-10 (×2): qty 10, 30d supply, fill #4
  Filled 2022-12-19: qty 10, 30d supply, fill #5

## 2022-02-14 NOTE — Telephone Encounter (Signed)
Refill has been sent.  °

## 2022-02-14 NOTE — Telephone Encounter (Signed)
Pt is calling. Requesting a refill on medication rizatriptan (MAXALT) 10 MG tablet. Refill should be sent [facility]

## 2022-02-14 NOTE — Therapy (Signed)
[facility] PARTIAL HOSPITALIZATION PROGRAM [address] [place], [place], [address] Phone: [phone]   Fax:  [phone]  Occupational Therapy Treatment Virtual Visit via Video Note  I connected with [name] on 02/14/22 at  8:00 AM EDT by a video enabled telemedicine application and verified that I am speaking with the correct person using two identifiers.  Location: Patient: home Provider: office   I discussed the limitations of evaluation and management by telemedicine and the availability of in person appointments. The patient expressed understanding and agreed to proceed.    The patient was advised to call back or seek an in-person evaluation if the symptoms worsen or if the condition fails to improve as anticipated.  I provided 55 minutes of non-face-to-face time during this encounter.   Patient Details  Name: Jill Alexander MRN: [id number] Date of Birth: 02-10-1996 No data recorded  Encounter Date: 02/14/2022   OT End of Session - 02/14/22 2022     Visit Number 4    Number of Visits 20    Date for OT Re-Evaluation 03/13/22    OT Start Time 1200    OT Stop Time 1255    OT Time Calculation (min) 55 min             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    Past Surgical History:  Procedure Laterality Date   none      There were no vitals filed for this visit.   Subjective Assessment - 02/14/22 2021     Currently in Pain? No/denies    Pain Score 0-No pain               Group Session:  S: Doing better today  O: Today's OT group aims to explore the intricate relationship between chronic pain, depression, and the resultant impact on Activities of Daily  Living (ADLs) and Instrumental Activities of Daily Living (iADLs). Through a multidisciplinary lens, it delves into common causes and symptoms, offering occupational therapy-aligned strategies for symptom improvement without medication. Among these strategies are targeted core and lower extremity strengthening exercises to mitigate low back pain due to anterior pelvic tilt, as well as granular communication techniques for patients to effectively express their chronic pain symptoms to healthcare providers. The emphasis is on non-pharmacological, adaptive approaches to improve mental health and functional performance, providing both healthcare providers and patients a well-rounded understanding of managing chronic pain and depression.   A: The patient was highly engaged throughout the presentation, demonstrating keen interest by actively participating in discussions and asking insightful questions regarding pain measurement scales and adaptive strategies for daily living. This level of engagement suggests a strong willingness to implement the discussed techniques in their own life, making them a good candidate for a tailored occupational therapy intervention plan aimed at addressing their specific challenges related to chronic pain and depression.     P: Continue to attend PHP OT group sessions 5x week for 4 weeks to promote daily structure, social engagement, and opportunities to develop and utilize adaptive strategies to maximize functional performance in preparation for safe transition and integration back into school, work, and the community. Plan  to address topic of pain in next OT group session.                    OT Education - 02/14/22 2021     Education Details Pain Mgmt              OT Short Term Goals - 02/11/22 1919       OT SHORT TERM GOAL #1   Title Pt will be educated on strategies to improve psychosocial skills needed to participate fully in all daily, work, and  leisure activities    Time 4    Period Weeks    Status New    Target Date 03/13/22      OT SHORT TERM GOAL #2   Title Pt will apply psychosocial skills and coping mechanisms to daily activities in order to function independently and reintegrate into community dwelling      OT [place] #3   Title Pt will recall and/or apply 1-3 sleep hygiene strategies to improve BADL routine when reintegrating into community      OT SHORT TERM GOAL #4   Title Pt will engage in goal setting to improve functional BADL/IADL routine upon reintegrating into community      OT [place] #5   Title Pt will choose and/or engage in 1-3 socially engaging leisure activities to improve social participation upon re integrating into community                      Plan - 02/14/22 2022     Occupational performance deficits (Please refer to evaluation for details): ADL's;IADL's;Rest and Sleep;Work;Leisure;Social Participation    Psychosocial Skills Coping Strategies;Habits;Interpersonal Interaction;Routines and Behaviors             Patient will benefit from skilled therapeutic intervention in order to improve the following deficits and impairments:       Psychosocial Skills: Coping Strategies, Habits, Interpersonal Interaction, Routines and Behaviors   Visit Diagnosis: Difficulty coping    Problem List Patient Active Problem List   Diagnosis Date Noted   GAD (generalized anxiety disorder) 02/06/2022   MDD (major depressive disorder), recurrent severe, without psychosis ([place]) 02/06/2022   Women's annual routine gynecological examination 12/25/2021   Encounter for birth control pills maintenance 11/22/2021   History of Chiari malformation 09/26/2021   Urinary urgency 09/26/2021   Common migraine with intractable migraine 07/01/2019   Intermittent headache 10/26/2018   Epigastric pain 10/26/2018   Gastroesophageal reflux disease 10/26/2018   Bipolar 2 disorder ([place])  06/02/2018   Panic attacks 06/02/2018   Autism spectrum disorder 05/06/2018   Anxiety and depression 05/06/2018   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   Seizures ([place]) 04/09/2017   Tremor, 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    Brantley Stage, OT 02/14/2022, 8:23 PM  [facility], [place], [place], [address] Phone: [phone]   Fax:  [phone]  Name: Farhana Fellows MRN: [id number] Date of Birth: 1995/07/04

## 2022-02-15 ENCOUNTER — Encounter (HOSPITAL_COMMUNITY): Payer: Self-pay | Admitting: Psychiatry

## 2022-02-15 ENCOUNTER — Encounter (HOSPITAL_COMMUNITY): Payer: Self-pay

## 2022-02-15 ENCOUNTER — Other Ambulatory Visit (HOSPITAL_COMMUNITY): Payer: No Typology Code available for payment source

## 2022-02-15 ENCOUNTER — Encounter (HOSPITAL_COMMUNITY): Payer: Self-pay | Admitting: Family

## 2022-02-15 ENCOUNTER — Other Ambulatory Visit (HOSPITAL_COMMUNITY): Payer: No Typology Code available for payment source | Admitting: Licensed Clinical Social Worker

## 2022-02-15 ENCOUNTER — Other Ambulatory Visit: Payer: Self-pay | Admitting: Neurology

## 2022-02-15 ENCOUNTER — Other Ambulatory Visit (HOSPITAL_COMMUNITY): Payer: Self-pay

## 2022-02-15 DIAGNOSIS — R4589 Other symptoms and signs involving emotional state: Secondary | ICD-10-CM

## 2022-02-15 DIAGNOSIS — F332 Major depressive disorder, recurrent severe without psychotic features: Secondary | ICD-10-CM

## 2022-02-15 DIAGNOSIS — F840 Autistic disorder: Secondary | ICD-10-CM

## 2022-02-15 NOTE — Progress Notes (Signed)
Virtual Visit via Video Note  I connected with Jill Alexander on 02/15/22 at  9:00 AM EDT by a video enabled telemedicine application and verified that I am speaking with the correct person using two identifiers.  Location: Patient: Home Provider: Office   I discussed the limitations of evaluation and management by telemedicine and the availability of in person appointments. The patient expressed understanding and agreed to proceed.  I discussed the assessment and treatment plan with the patient. The patient was provided an opportunity to ask questions and all were answered. The patient agreed with the plan and demonstrated an understanding of the instructions.   The patient was advised to call back or seek an in-person evaluation if the symptoms worsen or if the condition fails to improve as anticipated.  I provided 15 minutes of non-face-to-face time during this encounter.   Jill Center, NP   [facility] MD/PA/NP OP Progress Note  02/15/2022 10:51 AM Lorese Girouard  MRN:  [id number]  Chief Complaint: Worsening depression   Evaluation: Jill Alexander  was asked how she was feeling today states " I am alive, not great!" Jill Alexander " [name]."  was seen and evaluated via WebEx.  She reports frustration related to her employer.  Reports she only works 5 hours a week.  States this is not enough to support herself and she feels helpless and hopeless because she has nothing else to do with her time.  Reports she is recently seen and evaluated by provider [name] where her Prozac was increased from 60 mg to 80 mg however patient is now requesting to be started on a new medication called Auvelity?.    Clarification regarding medication spelling.  She reports she has been on Abilify, Latuda, Lamictal and Vraylar in the past which was not helpful.  Discussed due to recent medication adjustment patient to follow-up with primary provider.  Consideration for cross taper medication due to recent increase  with Prozac.  She was receptive to plan.  " [name]" is currently denying suicidal or homicidal ideations.  Denies auditory visual hallucinations.  She presents with a flat affect.  States she is resting well through the night.  Patient's thought process appeared to be very concrete.  Patient to continue group sessions.  Support, encouragement and reassurance was provided.  Visit Diagnosis:    ICD-10-CM   1. MDD (major depressive disorder), recurrent severe, without psychosis ([place])  F33.2     2. Autism spectrum disorder  F84.0       Past Psychiatric History:  Past Medical History:  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    Past Surgical History:  Procedure Laterality Date   none      Family Psychiatric History:   Family History:  Family History  Problem Relation Age of Onset   Depression Father    Depression Sister    Anxiety disorder Sister    Diabetes Maternal Grandfather    Cancer Maternal Grandfather    Breast cancer Paternal Grandmother    Uterine cancer Paternal Grandmother    Irritable bowel syndrome Paternal Grandmother    Colon cancer Neg Hx    Esophageal cancer Neg Hx    Stomach  cancer Neg Hx    Rectal cancer Neg Hx     Social History:  Social History   Socioeconomic History   Marital status: Single    Spouse name: Not on file   Number of children: 0   Years of education: Not on file   Highest education level: Some college, no degree  Occupational History   Not on file  Tobacco Use   Smoking status: Never   Smokeless tobacco: Never  Vaping Use   Vaping Use: Never used  Substance and Sexual Activity   Alcohol use: Yes    Comment: 1 cocktail a few times a year   Drug use: No   Sexual activity: Not  Currently    Birth control/protection: Pill  Other Topics Concern   Not on file  Social History Narrative   Lives with parents in [place] with her mom and dad. Pt moved here from [place] in 2018.      Siblings- Pt is the youngest of out 3 siblings. 31 sister 24 years older than her, 1 brother who is 81 years older than her.    Schooling- started college right after HS graduation and stopped after a year   Married- denies    Kids- denies    Legal issues- denies       Caffeine use: 2-3 cups per day   Left handed    Social Determinants of Health   Financial Resource Strain: Low Risk  (05/27/2018)   Overall Financial Resource Strain (CARDIA)    Difficulty of Paying Living Expenses: Not hard at all  Food Insecurity: No Food Insecurity (05/27/2018)   Hunger Vital Sign    Worried About Running Out of Food in the Last Year: Never true    Ran Out of Food in the Last Year: Never true  Transportation Needs: Unmet Transportation Needs (06/29/2018)   PRAPARE - Transportation    Lack of Transportation (Medical): Yes    Lack of Transportation (Non-Medical): Yes  Physical Activity: Unknown (06/29/2018)   Exercise Vital Sign    Days of Exercise per Week: 3 days    Minutes of Exercise per Session: Not on file  Recent Concern: Physical Activity - Insufficiently Active (05/27/2018)   Exercise Vital Sign    Days of Exercise per Week: 2 days    Minutes of Exercise per Session: 30 min  Stress: Stress Concern Present (06/29/2018)   [place]    Feeling of Stress : Rather much  Social Connections: Unknown (06/29/2018)   Social Connection and Isolation Panel [NHANES]    Frequency of Communication with Friends and Family: Not on file    Frequency of Social Gatherings with Friends and Family: Not on file    Attends Religious Services: Not on file    Active Member of Clubs or Organizations: Not on file    Attends Archivist Meetings:  Not on file    Marital Status: Never married    Allergies:  Allergies  Allergen Reactions   Gluten Meal     Metabolic Disorder Labs: Lab Results  Component Value Date   HGBA1C 5.2 07/14/2019   No results found for: "PROLACTIN" No results found for: "CHOL", "TRIG", "HDL", "CHOLHDL", "VLDL", "[place]" Lab Results  Component Value Date   TSH 0.780 09/25/2021   TSH 1.110 01/26/2021    Therapeutic Level Labs: Lab Results  Component Value Date   LITHIUM 0.8 01/05/2018   LITHIUM 0.5 (L) 12/18/2017   No  results found for: "VALPROATE" No results found for: "CBMZ"  Current Medications: Current Outpatient Medications  Medication Sig Dispense Refill   acetaminophen (TYLENOL) 500 MG tablet Take 500 mg by mouth every 6 (six) hours as needed.     Docusate Sodium (COLACE PO) Take by mouth.     drospirenone-ethinyl estradiol (YAZ) 3-0.02 MG tablet Take 1 tablet by mouth daily. 28 tablet 11   Erenumab-aooe (AIMOVIG) 140 MG/ML SOAJ Inject 140 mg into the skin every 30 (thirty) days. (Patient not taking: Reported on 02/12/2022) 1 mL 11   Famotidine (PEPCID PO) Take by mouth. (Patient not taking: Reported on 02/12/2022)     FLUoxetine (PROZAC) 20 MG capsule Take 3 capsules (60 mg total) by mouth daily. 270 capsule 0   Ibuprofen (MOTRIN PO) Take 200 mg by mouth as needed.     lamoTRIgine (LAMICTAL) 200 MG tablet Take 1 tablet (200 mg total) by mouth 2 (two) times daily for seizures and depression. 60 tablet 2   LORazepam (ATIVAN) 1 MG tablet Take 1 tablet (1 mg total) by mouth 2 (two) times daily as needed for anxiety. 60 tablet 1   lurasidone (LATUDA) 40 MG TABS tablet Take 1 tablet (40 mg total) by mouth daily with breakfast. 90 tablet 0   melatonin 1 MG TABS tablet Take 1 tablet (1 mg total) by mouth at bedtime as needed (insomnia). 30 tablet 0   mirabegron ER (MYRBETRIQ) 50 MG TB24 tablet Take 1 tablet (50 mg total) by mouth daily. 30 tablet 3   Multiple Vitamins-Minerals (MULTIVITAMIN  PO) Take 1 tablet by mouth daily.     omeprazole (PRILOSEC) 20 MG capsule Take 1 capsule (20 mg total) by mouth daily. (Patient not taking: Reported on 02/12/2022) 30 capsule 5   polyethylene glycol (MIRALAX) packet Take 17 g by mouth daily as needed. 14 each 0   rizatriptan (MAXALT) 10 MG tablet Take 1 tablet (10 mg total) by mouth as needed for migraine. May repeat in 2 hours if needed. 10 tablet 11   topiramate (TOPAMAX) 50 MG tablet Take 3 tablets (150 mg total) by mouth at bedtime. 270 tablet 0   No current facility-administered medications for this visit.     Musculoskeletal:  Psychiatric Specialty Exam: Review of Systems  HENT: Negative.    Eyes: Negative.   Cardiovascular: Negative.   Musculoskeletal: Negative.   Psychiatric/Behavioral:  Suicidal ideas: passive ideiaons. The patient is nervous/anxious.   All other systems reviewed and are negative.   There were no vitals taken for this visit.There is no height or weight on file to calculate BMI.  General Appearance: Casual  Eye Contact:  Fair  Speech:  Clear and Coherent  Volume:  Normal  Mood:  Anxious and Depressed  Affect:  Congruent  Thought Process:  Coherent  Orientation:  Full (Time, Place, and Person)  Thought Content: Logical   Suicidal Thoughts:  No  Homicidal Thoughts:  No  Memory:  Immediate;   Good Recent;   Good  Judgement:  Good  Insight:  Good  Psychomotor Activity:  Normal  Concentration:  Concentration: Good  Recall:  Good  Fund of Knowledge: Good  Language: Good  Akathisia:  No  Handed:  Right  AIMS (if indicated): done  Assets:  Communication Skills Desire for Improvement Social Support  ADL's:  Intact  Cognition: WNL  Sleep:  Good   Screenings: ECT-MADRS    Flowsheet Row ECT Treatment from 07/31/2018 in [facility] ECT Treatment from  07/13/2018 in [facility] ECT Treatment from 07/06/2018 in [facility]  MADRS Total Score 24 20 37      GAD-7    Flowsheet Row Counselor from 02/06/2022 in [facility] Counselor from 06/01/2018 in [facility] Counselor from 05/22/2018 in [facility] Office Visit from 01/15/2018 in [facility] Office Visit from 12/17/2017 in [facility]  Total GAD-7 Score 19 18 19 15 14       Mini-Mental    Flowsheet Row ECT Treatment from 07/31/2018 in [facility] ECT Treatment from 07/13/2018 in [facility] ECT Treatment from 07/06/2018 in [place]  Total Score (max 30 points ) 30 30 30       PHQ2-9    Flowsheet Row Counselor from 02/12/2022 in [place] Counselor from 02/06/2022 in [facility] Video Visit from 01/17/2022 in [facility] Video Visit from 12/13/2021 in [facility] Office Visit from 11/08/2021 in [facility]  PHQ-2 Total Score 6 6 3 5 5   PHQ-9 Total Score 26 25 15 18 18       Flowsheet Row Counselor from 02/12/2022 in [facility] Counselor from 02/06/2022 in [place] [place] Video Visit from 01/17/2022 in [facility]  C-SSRS RISK CATEGORY Error: Question 6 not populated Error: Q3, 4, or 5 should not be populated when Q2 is No Low Risk        Assessment and Plan:  Continue partial hospitalization programming Continue Prozac 80 mg daily as it was reported patient recently increase this medication on 02/10/2022 -Patient to follow-up with her primary care provider regarding initiating Auvelity,  considering cross taper.   Collaboration of Care: Collaboration of Care: Medication Management AEB Auvelity   Patient/Guardian was advised Release of Information must be obtained prior to any record release in order to collaborate their care with an outside provider. Patient/Guardian was advised if they have not already done so to contact the registration department to sign all necessary forms in order for [place] to release information regarding their care.   Consent: Patient/Guardian gives verbal consent for treatment and assignment of benefits for services provided during this visit. Patient/Guardian expressed understanding and agreed to proceed.    Jill Center, NP 02/15/2022, 10:51 AM

## 2022-02-15 NOTE — Progress Notes (Signed)
error 

## 2022-02-15 NOTE — Therapy (Signed)
[place], [place], [address] Phone: [phone]   Fax:  [phone]  Occupational Therapy Treatment Virtual Visit via Video Note  I connected with [name] on 02/15/22 at  8:00 AM EDT by a video enabled telemedicine application and verified that I am speaking with the correct person using two identifiers.  Location: Patient: home Provider: office   I discussed the limitations of evaluation and management by telemedicine and the availability of in person appointments. The patient expressed understanding and agreed to proceed.    The patient was advised to call back or seek an in-person evaluation if the symptoms worsen or if the condition fails to improve as anticipated.  I provided 55 minutes of non-face-to-face time during this encounter.   Patient Details  Name: Jill Alexander MRN: [id number] Date of Birth: [birth year] No data recorded  Encounter Date: 02/15/2022   OT End of Session - 02/15/22 2152     Visit Number 5    Number of Visits 20    Date for OT Re-Evaluation 03/13/22    OT Start Time 1200    OT Stop Time 1255    OT Time Calculation (min) 55 min             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    Past Surgical History:  Procedure Laterality Date   none      There were no vitals filed for this visit.   Subjective Assessment - 02/15/22 2151     Currently in Pain? No/denies    Pain Score 0-No pain                Group Session:  S: Doing better today  O: Today's OT group aims to explore the intricate relationship between chronic pain, depression, and the resultant impact on Activities of Daily  Living (ADLs) and Instrumental Activities of Daily Living (iADLs). Through a multidisciplinary lens, it delves into common causes and symptoms, offering occupational therapy-aligned strategies for symptom improvement without medication. Among these strategies are targeted core and lower extremity strengthening exercises to mitigate low back pain due to anterior pelvic tilt, as well as granular communication techniques for patients to effectively express their chronic pain symptoms to healthcare providers. The emphasis is on non-pharmacological, adaptive approaches to improve mental health and functional performance, providing both healthcare providers and patients a well-rounded understanding of managing chronic pain and depression.   A:   The patient was highly engaged throughout the presentation, demonstrating keen interest by actively participating in discussions and asking insightful questions regarding pain measurement scales and adaptive strategies for daily living. This level of engagement suggests a strong willingness to implement the discussed techniques in their own life, making them a good candidate for a tailored occupational therapy intervention plan aimed at addressing their specific challenges related to chronic pain and depression.     P: Continue to attend PHP OT group sessions 5x week for 4 weeks to promote daily structure, social engagement, and opportunities to develop and utilize adaptive strategies to maximize functional performance in preparation for safe transition and integration back into school, work, and  the community. Plan to address topic of tbd in next OT group session.                   OT Education - 02/15/22 2152     Education Details Pain Mgmt 3              OT Short Term Goals - 02/11/22 1919       OT SHORT TERM GOAL #1   Title Pt will be educated on strategies to improve psychosocial skills needed to participate fully in all daily, work,  and leisure activities    Time 4    Period Weeks    Status New    Target Date 03/13/22      OT SHORT TERM GOAL #2   Title Pt will apply psychosocial skills and coping mechanisms to daily activities in order to function independently and reintegrate into community dwelling      OT [place] #3   Title Pt will recall and/or apply 1-3 sleep hygiene strategies to improve BADL routine when reintegrating into community      OT SHORT TERM GOAL #4   Title Pt will engage in goal setting to improve functional BADL/IADL routine upon reintegrating into community      OT [place] #5   Title Pt will choose and/or engage in 1-3 socially engaging leisure activities to improve social participation upon re integrating into community                      Plan - 02/15/22 2153     Psychosocial Skills Coping Strategies;Habits;Interpersonal Interaction;Routines and Behaviors             Patient will benefit from skilled therapeutic intervention in order to improve the following deficits and impairments:       Psychosocial Skills: Coping Strategies, Habits, Interpersonal Interaction, Routines and Behaviors   Visit Diagnosis: Difficulty coping    Problem List Patient Active Problem List   Diagnosis Date Noted   GAD (generalized anxiety disorder) 02/06/2022   MDD (major depressive disorder), recurrent severe, without psychosis ([place]) 02/06/2022   Women's annual routine gynecological examination 12/25/2021   Encounter for birth control pills maintenance 11/22/2021   History of Chiari malformation 09/26/2021   Urinary urgency 09/26/2021   Common migraine with intractable migraine 07/01/2019   Intermittent headache 10/26/2018   Epigastric pain 10/26/2018   Gastroesophageal reflux disease 10/26/2018   Bipolar 2 disorder ([place]) 06/02/2018   Panic attacks 06/02/2018   Autism spectrum disorder 05/06/2018   Anxiety and depression 05/06/2018   Mood disorder in conditions  classified elsewhere 01/29/2018   Seizures ([place]) 04/09/2017   Tremor, 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    Brantley Stage, OT 02/15/2022, 9:53 PM  [facility], [place], [place], [address] Phone: [phone]   Fax:  [phone]  Name: Jill Alexander MRN: [id number] Date of Birth: [birth year]

## 2022-02-18 ENCOUNTER — Other Ambulatory Visit (HOSPITAL_COMMUNITY): Payer: Self-pay

## 2022-02-18 ENCOUNTER — Other Ambulatory Visit (HOSPITAL_COMMUNITY): Payer: No Typology Code available for payment source

## 2022-02-18 ENCOUNTER — Encounter (HOSPITAL_COMMUNITY): Payer: Self-pay

## 2022-02-18 ENCOUNTER — Other Ambulatory Visit (HOSPITAL_COMMUNITY): Payer: No Typology Code available for payment source | Admitting: Licensed Clinical Social Worker

## 2022-02-18 DIAGNOSIS — F840 Autistic disorder: Secondary | ICD-10-CM

## 2022-02-18 DIAGNOSIS — F332 Major depressive disorder, recurrent severe without psychotic features: Secondary | ICD-10-CM

## 2022-02-18 DIAGNOSIS — R4589 Other symptoms and signs involving emotional state: Secondary | ICD-10-CM

## 2022-02-18 DIAGNOSIS — F411 Generalized anxiety disorder: Secondary | ICD-10-CM

## 2022-02-18 MED ORDER — TOPIRAMATE 50 MG PO TABS
150.0000 mg | ORAL_TABLET | Freq: Every day | ORAL | 0 refills | Status: DC
  Filled 2022-02-18 – 2022-04-27 (×3): qty 270, 90d supply, fill #0

## 2022-02-18 NOTE — Therapy (Signed)
[facility] PARTIAL HOSPITALIZATION PROGRAM [address] [place], [place], [address] Phone: [phone]   Fax:  [phone]  Occupational Therapy Treatment Virtual Visit via Video Note  I connected with Jill Alexander on 02/18/22 at  8:00 AM EDT by a video enabled telemedicine application and verified that I am speaking with the correct person using two identifiers.  Location: Patient: home Provider: office   I discussed the limitations of evaluation and management by telemedicine and the availability of in person appointments. The patient expressed understanding and agreed to proceed.    The patient was advised to call back or seek an in-person evaluation if the symptoms worsen or if the condition fails to improve as anticipated.  I provided 55 minutes of non-face-to-face time during this encounter.   Patient Details  Name: Jill Alexander MRN: [id number] Date of Birth: 1995/07/05 No data recorded  Encounter Date: 02/18/2022   OT End of Session - 02/18/22 1950     Visit Number 6    Number of Visits 20    Date for OT Re-Evaluation 03/13/22    OT Start Time 1200    OT Stop Time 1255    OT Time Calculation (min) 55 min             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    Past Surgical History:  Procedure Laterality Date   none      There were no vitals filed for this visit.   Subjective Assessment - 02/18/22 1950     Currently in Pain? No/denies    Pain Score 0-No pain               Group Session:  S: Doing okay I guess. Didn't have a bad weekend, it was just whatever.  O: During today's OT group session, the patient participated in an educational segment about the  importance of goal-setting and the application of the SMART framework to enhance daily life, particularly focusing on ADLs and iADLs. The session began with five open-ended pre-session questions that facilitated group discussion and introspection about their current relationship with goals. Following the introduction and educational segment, participants engaged in brainstorming and group discussions to devise hypothetical SMART goals. The session concluded with five post-session questions to reinforce understanding and facilitate reflection. Throughout the session, there was a range of engagement levels noted among the participants.   A:  Patient demonstrated a high level of engagement throughout the session. They actively participated in discussions, sharing personal experiences related to goal setting and challenges faced. Patient was able to clearly articulate an understanding of the SMART framework and proposed personal SMART goals related to their own ADLs with minimal assistance. They expressed enthusiasm about applying what they learned to their daily routine and appeared motivated to make changes.   P: Continue to attend PHP OT group sessions 5x week for 4 weeks to promote daily structure, social engagement, and opportunities to develop and utilize adaptive strategies to maximize functional performance in preparation for safe transition and integration back into school, work, and the community. Plan to address topic of pt 2 in next OT group session.  OT Education - 02/18/22 1950     Education Details SMART Goals              OT Short Term Goals - 02/11/22 1919       OT SHORT TERM GOAL #1   Title Pt will be educated on strategies to improve psychosocial skills needed to participate fully in all daily, work, and leisure activities    Time 4    Period Weeks    Status New    Target Date 03/13/22      OT SHORT TERM GOAL #2   Title Pt will apply  psychosocial skills and coping mechanisms to daily activities in order to function independently and reintegrate into community dwelling      OT [place] #3   Title Pt will recall and/or apply 1-3 sleep hygiene strategies to improve BADL routine when reintegrating into community      OT SHORT TERM GOAL #4   Title Pt will engage in goal setting to improve functional BADL/IADL routine upon reintegrating into community      OT [place] #5   Title Pt will choose and/or engage in 1-3 socially engaging leisure activities to improve social participation upon re integrating into community                      Plan - 02/18/22 1951     Psychosocial Skills Coping Strategies;Habits;Interpersonal Interaction;Routines and Behaviors             Patient will benefit from skilled therapeutic intervention in order to improve the following deficits and impairments:       Psychosocial Skills: Coping Strategies, Habits, Interpersonal Interaction, Routines and Behaviors   Visit Diagnosis: Difficulty coping    Problem List Patient Active Problem List   Diagnosis Date Noted   GAD (generalized anxiety disorder) 02/06/2022   MDD (major depressive disorder), recurrent severe, without psychosis ([place]) 02/06/2022   Women's annual routine gynecological examination 12/25/2021   Encounter for birth control pills maintenance 11/22/2021   History of Chiari malformation 09/26/2021   Urinary urgency 09/26/2021   Common migraine with intractable migraine 07/01/2019   Intermittent headache 10/26/2018   Epigastric pain 10/26/2018   Gastroesophageal reflux disease 10/26/2018   Bipolar 2 disorder ([place]) 06/02/2018   Panic attacks 06/02/2018   Autism spectrum disorder 05/06/2018   Anxiety and depression 05/06/2018   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   Seizures ([place]) 04/09/2017   Tremor, 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    Brantley Stage, OT 02/18/2022, 7:51  PM  [facility], [place], [place], [address] Phone: [phone]   Fax:  [phone]  Name: Jill Alexander MRN: [id number] Date of Birth: [birth year]

## 2022-02-19 ENCOUNTER — Ambulatory Visit: Payer: No Typology Code available for payment source | Admitting: Clinical

## 2022-02-19 ENCOUNTER — Other Ambulatory Visit (HOSPITAL_COMMUNITY): Payer: No Typology Code available for payment source | Admitting: Licensed Clinical Social Worker

## 2022-02-19 ENCOUNTER — Other Ambulatory Visit (HOSPITAL_COMMUNITY): Payer: No Typology Code available for payment source

## 2022-02-19 ENCOUNTER — Encounter (HOSPITAL_COMMUNITY): Payer: Self-pay

## 2022-02-19 DIAGNOSIS — R4589 Other symptoms and signs involving emotional state: Secondary | ICD-10-CM | POA: Diagnosis not present

## 2022-02-19 DIAGNOSIS — F411 Generalized anxiety disorder: Secondary | ICD-10-CM

## 2022-02-19 DIAGNOSIS — F840 Autistic disorder: Secondary | ICD-10-CM

## 2022-02-19 DIAGNOSIS — F332 Major depressive disorder, recurrent severe without psychotic features: Secondary | ICD-10-CM

## 2022-02-19 NOTE — Therapy (Signed)
[place], [place], [address] Phone: [phone]   Fax:  [phone]  Occupational Therapy Treatment Virtual Visit via Video Note  I connected with [name] on 02/19/22 at  8:00 AM EDT by a video enabled telemedicine application and verified that I am speaking with the correct person using two identifiers.  Location: Patient: home Provider: office   I discussed the limitations of evaluation and management by telemedicine and the availability of in person appointments. The patient expressed understanding and agreed to proceed.    The patient was advised to call back or seek an in-person evaluation if the symptoms worsen or if the condition fails to improve as anticipated.  I provided 55 minutes of non-face-to-face time during this encounter.   Patient Details  Name: Jill Alexander MRN: [id number] Date of Birth: Sep 18, 1995 No data recorded  Encounter Date: 02/19/2022   OT End of Session - 02/19/22 1818     Visit Number 7    Number of Visits 20    Date for OT Re-Evaluation 03/13/22    OT Start Time 1200    OT Stop Time 1255    OT Time Calculation (min) 55 min             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    Past Surgical History:  Procedure Laterality Date   none      There were no vitals filed for this visit.   Subjective Assessment - 02/19/22 1818     Currently in Pain? No/denies    Pain Score 0-No pain                Group Session:  S: I'm just here.  O: During today's OT group session, the patient participated in an educational segment about the importance of goal-setting and the application of the  SMART framework to enhance daily life, particularly focusing on ADLs and iADLs. The session began with five open-ended pre-session questions that facilitated group discussion and introspection about their current relationship with goals. Following the introduction and educational segment, participants engaged in brainstorming and group discussions to devise hypothetical SMART goals. The session concluded with five post-session questions to reinforce understanding and facilitate reflection. Throughout the session, there was a range of engagement levels noted among the participants.   A:  Patient demonstrated a high level of engagement throughout the session. They actively participated in discussions, sharing personal experiences related to goal setting and challenges faced. Patient was able to clearly articulate an understanding of the SMART framework and proposed personal SMART goals related to their own ADLs with minimal assistance. They expressed enthusiasm about applying what they learned to their daily routine and appeared motivated to make changes.    P: Continue to attend PHP OT group sessions 5x week for 4 weeks to promote daily structure, social engagement, and opportunities to develop and utilize adaptive strategies to maximize functional performance in preparation for safe transition and integration back into school, work, and the community. Plan to address topic of tbd in next OT group session.  OT Education - 02/19/22 1818     Education Details SMART Goals 2              OT Short Term Goals - 02/11/22 1919       OT SHORT TERM GOAL #1   Title Pt will be educated on strategies to improve psychosocial skills needed to participate fully in all daily, work, and leisure activities    Time 4    Period Weeks    Status New    Target Date 03/13/22      OT SHORT TERM GOAL #2   Title Pt will apply psychosocial skills and coping mechanisms to daily  activities in order to function independently and reintegrate into community dwelling      OT SHORT TERM GOAL #3   Title Pt will recall and/or apply 1-3 sleep hygiene strategies to improve BADL routine when reintegrating into community      OT SHORT TERM GOAL #4   Title Pt will engage in goal setting to improve functional BADL/IADL routine upon reintegrating into community      OT SHORT TERM GOAL #5   Title Pt will choose and/or engage in 1-3 socially engaging leisure activities to improve social participation upon re integrating into community                      Plan - 02/19/22 1819     Psychosocial Skills Coping Strategies;Habits;Interpersonal Interaction;Routines and Behaviors             Patient will benefit from skilled therapeutic intervention in order to improve the following deficits and impairments:       Psychosocial Skills: Coping Strategies, Habits, Interpersonal Interaction, Routines and Behaviors   Visit Diagnosis: Difficulty coping    Problem List Patient Active Problem List   Diagnosis Date Noted   GAD (generalized anxiety disorder) 02/06/2022   MDD (major depressive disorder), recurrent severe, 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   Women's annual routine gynecological examination 12/25/2021   Encounter for birth control pills maintenance 11/22/2021   History of Chiari malformation 09/26/2021   Urinary urgency 09/26/2021   Common migraine with intractable migraine 07/01/2019   Intermittent headache 10/26/2018   Epigastric pain 10/26/2018   Gastroesophageal reflux disease 10/26/2018   Bipolar 2 disorder (HCC) 06/02/2018   Panic attacks 06/02/2018   Autism spectrum disorder 05/06/2018   Anxiety and depression 05/06/2018   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   Seizures (HCC) 04/09/2017   Tremor, 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    Ted Mcalpine, OT 02/19/2022, 6:19 PM  Kerrin Champagne, OT   [facility] HOSPITALIZATION PROGRAM [address] [place], [place], [address] Phone: [phone]   Fax:  [phone]  Name: Jill Alexander MRN: [id number] Date of Birth: [birth year]

## 2022-02-20 ENCOUNTER — Other Ambulatory Visit (HOSPITAL_COMMUNITY): Payer: No Typology Code available for payment source

## 2022-02-20 ENCOUNTER — Other Ambulatory Visit (HOSPITAL_COMMUNITY): Payer: Self-pay

## 2022-02-20 ENCOUNTER — Other Ambulatory Visit (HOSPITAL_COMMUNITY): Payer: No Typology Code available for payment source | Admitting: Licensed Clinical Social Worker

## 2022-02-20 ENCOUNTER — Encounter (HOSPITAL_COMMUNITY): Payer: Self-pay

## 2022-02-20 DIAGNOSIS — F840 Autistic disorder: Secondary | ICD-10-CM

## 2022-02-20 DIAGNOSIS — F411 Generalized anxiety disorder: Secondary | ICD-10-CM

## 2022-02-20 DIAGNOSIS — F332 Major depressive disorder, recurrent severe without psychotic features: Secondary | ICD-10-CM

## 2022-02-20 DIAGNOSIS — R4589 Other symptoms and signs involving emotional state: Secondary | ICD-10-CM | POA: Diagnosis not present

## 2022-02-20 NOTE — Therapy (Signed)
[facility] PARTIAL HOSPITALIZATION PROGRAM [address] [place], [place], [address] Phone: [phone]   Fax:  [phone]  Occupational Therapy Treatment Virtual Visit via Video Note  I connected with Jill Alexander on 02/20/22 at  8:00 AM EDT by a video enabled telemedicine application and verified that I am speaking with the correct person using two identifiers.  Location: Patient: home Provider: office   I discussed the limitations of evaluation and management by telemedicine and the availability of in person appointments. The patient expressed understanding and agreed to proceed.    The patient was advised to call back or seek an in-person evaluation if the symptoms worsen or if the condition fails to improve as anticipated.  I provided 55 minutes of non-face-to-face time during this encounter.   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   Subjective Assessment - 02/20/22 1833     Currently in Pain? No/denies    Pain Score 0-No pain               Group Session:  S: I'm just kinda here today, I don't know.   O: Navigating Life's Challenges with Modifications and Adaptive Strategies: An Occupational Therapist's Perspective on Mind,  Body, and Soul" provides an in-depth exploration of physical, emotional/mental, and cognitive limitations and barriers faced by adults.   The objective of the group is to examine how modifications and adaptive strategies, from an occupational therapist's viewpoint, can lead to positive changes in addressing these limitations.  The group emphasizes the interconnectedness of the mind, body, and soul, and highlights the role of occupational therapy in promoting holistic well-being.   It discusses various modifications and adaptive strategies, such as assistive devices, therapeutic interventions, environmental modifications, and advocacy efforts, to overcome barriers and empower individuals. The objective of the group is to provide insights and guidance to professionals and individuals seeking to enhance their understanding and approach to addressing limitations and fostering positive transformations in the context of occupational therapy.   A:  The patient actively participated in the virtual session and demonstrated a strong commitment to understanding and addressing their limitations. They actively discussed their physical, emotional/mental, and cognitive challenges, providing valuable insights into their daily life experiences. The patient displayed a willingness to explore modifications and adaptive strategies and expressed openness to incorporating them into their routine. They showed a good understanding of the interconnectedness of mind, body, and soul and acknowledged the importance of holistic well-being.  During the session, the patient demonstrated a proactive attitude towards  their physical limitations, showing eagerness to utilize assistive devices and engage in tailored exercise programs. They expressed interest in exploring home modifications to create a safe and accessible environment. The patient also actively participated in discussions related to emotional/mental limitations, engaging in  conversations regarding mindfulness practices, relaxation techniques, and stress management strategies. They demonstrated a strong motivation to develop coping skills and improve emotional regulation.  Furthermore, the patient showed an active interest in cognitive rehabilitation, acknowledging the impact of cognitive limitations on their daily functioning. They actively engaged in discussions about memory strategies, attention training, and problem-solving exercises. The patient demonstrated a readiness to explore environmental modifications and organizational tools to support cognitive functioning. Their engagement throughout the session and commitment to positive changes indicate a high potential for successful outcomes with the implementation of modifications and adaptive strategies.   P: Continue to attend PHP OT group sessions 5x week for 4 weeks to promote daily structure, social engagement, and opportunities to develop and utilize adaptive strategies to maximize functional performance in preparation for safe transition and integration back into school, work, and the community. Plan to address topic of pt 2 in next OT group session.                    OT Education - 02/20/22 1834     Education Details Navigating Life's Challenges with Modifications and Adaptive Strategies              OT Short Term Goals - 02/11/22 1919       OT SHORT TERM GOAL #1   Title Pt will be educated on strategies to improve psychosocial skills needed to participate fully in all daily, work, and leisure activities    Time 4    Period Weeks    Status New    Target Date 03/13/22      OT SHORT TERM GOAL #2   Title Pt will apply psychosocial skills and coping mechanisms to daily activities in order to function independently and reintegrate into community dwelling      OT [place] #3   Title Pt will recall and/or apply 1-3 sleep hygiene strategies to improve BADL routine when  reintegrating into community      OT SHORT TERM GOAL #4   Title Pt will engage in goal setting to improve functional BADL/IADL routine upon reintegrating into community      OT [place] #5   Title Pt will choose and/or engage in 1-3 socially engaging leisure activities to improve social participation upon re integrating into community                      Plan - 02/20/22 1834     Psychosocial Skills Coping Strategies;Habits;Interpersonal Interaction;Routines and Behaviors             Patient will benefit from skilled therapeutic intervention in order to improve the following deficits and impairments:       Psychosocial Skills: Coping Strategies, Habits, Interpersonal Interaction, Routines and Behaviors   Visit Diagnosis: Difficulty coping    Problem List Patient Active Problem List   Diagnosis Date Noted   GAD (generalized anxiety disorder) 02/06/2022   MDD (major depressive disorder), recurrent severe, without psychosis ([place]) 02/06/2022   Women's annual routine gynecological examination 12/25/2021   Encounter for birth control pills maintenance 11/22/2021   History of Chiari malformation 09/26/2021   Urinary urgency 09/26/2021   Common migraine with intractable migraine 07/01/2019   Intermittent headache  10/26/2018   Epigastric pain 10/26/2018   Gastroesophageal reflux disease 10/26/2018   Bipolar 2 disorder (HCC) 06/02/2018   Panic attacks 06/02/2018   Autism spectrum disorder 05/06/2018   Anxiety and depression 05/06/2018   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   Seizures (HCC) 04/09/2017   Tremor, 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    Ted Mcalpine, OT 02/20/2022, 6:35 PM  Kerrin Champagne, OT   [facility] HOSPITALIZATION PROGRAM [address] [place], [place], [address] Phone: [phone]   Fax:  [phone]  Name: Waunita Sandstrom MRN: [id number] Date of Birth: 03-07-1996

## 2022-02-21 ENCOUNTER — Other Ambulatory Visit (HOSPITAL_COMMUNITY): Payer: No Typology Code available for payment source | Admitting: Licensed Clinical Social Worker

## 2022-02-21 ENCOUNTER — Other Ambulatory Visit (HOSPITAL_COMMUNITY): Payer: Self-pay

## 2022-02-21 ENCOUNTER — Encounter (HOSPITAL_COMMUNITY): Payer: Self-pay

## 2022-02-21 ENCOUNTER — Other Ambulatory Visit (HOSPITAL_COMMUNITY): Payer: No Typology Code available for payment source

## 2022-02-21 DIAGNOSIS — F332 Major depressive disorder, recurrent severe without psychotic features: Secondary | ICD-10-CM

## 2022-02-21 DIAGNOSIS — F840 Autistic disorder: Secondary | ICD-10-CM

## 2022-02-21 DIAGNOSIS — R4589 Other symptoms and signs involving emotional state: Secondary | ICD-10-CM

## 2022-02-21 DIAGNOSIS — F411 Generalized anxiety disorder: Secondary | ICD-10-CM

## 2022-02-21 NOTE — Therapy (Signed)
[place], [place], [address] Phone: [phone]   Fax:  [phone]  Occupational Therapy Treatment Virtual Visit via Video Note  I connected with [name] on 02/21/22 at  8:00 AM EDT by a video enabled telemedicine application and verified that I am speaking with the correct person using two identifiers.  Location: Patient: home Provider: office   I discussed the limitations of evaluation and management by telemedicine and the availability of in person appointments. The patient expressed understanding and agreed to proceed.    The patient was advised to call back or seek an in-person evaluation if the symptoms worsen or if the condition fails to improve as anticipated.  I provided 55 minutes of non-face-to-face time during this encounter.   Patient Details  Name: Jill Alexander MRN: [id number] Date of Birth: Oct 18, 1995 No data recorded  Encounter Date: 02/21/2022   OT End of Session - 02/21/22 2315     Visit Number 9    Number of Visits 20    Date for OT Re-Evaluation 03/13/22    OT Start Time 1200    OT Stop Time 1255    OT Time Calculation (min) 55 min             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    Past Surgical History:  Procedure Laterality Date   none      There were no vitals filed for this visit.   Subjective Assessment - 02/21/22 2315     Currently in Pain? No/denies    Pain Score 0-No pain                Group Session:  S: Just doing okay  O: Navigating Life's Challenges with Modifications and Adaptive Strategies: An Occupational Therapist's Perspective on Mind, Body, and Soul" provides  an in-depth exploration of physical, emotional/mental, and cognitive limitations and barriers faced by adults.   The objective of the group is to examine how modifications and adaptive strategies, from an occupational therapist's viewpoint, can lead to positive changes in addressing these limitations.  The group emphasizes the interconnectedness of the mind, body, and soul, and highlights the role of occupational therapy in promoting holistic well-being.   It discusses various modifications and adaptive strategies, such as assistive devices, therapeutic interventions, environmental modifications, and advocacy efforts, to overcome barriers and empower individuals. The objective of the group is to provide insights and guidance to professionals and individuals seeking to enhance their understanding and approach to addressing limitations and fostering positive transformations in the context of occupational therapy.   A:  The patient actively participated in the virtual session and demonstrated a strong commitment to understanding and addressing their limitations. They actively discussed their physical, emotional/mental, and cognitive challenges, providing valuable insights into their daily life experiences. The patient displayed a willingness to explore modifications and adaptive strategies and expressed openness to incorporating them into their routine. They showed a good understanding of the interconnectedness of mind, body, and soul and acknowledged the importance of holistic well-being.  During the session, the patient demonstrated a proactive attitude towards their physical limitations, showing eagerness  to utilize assistive devices and engage in tailored exercise programs. They expressed interest in exploring home modifications to create a safe and accessible environment. The patient also actively participated in discussions related to emotional/mental limitations, engaging in conversations regarding  mindfulness practices, relaxation techniques, and stress management strategies. They demonstrated a strong motivation to develop coping skills and improve emotional regulation.  Furthermore, the patient showed an active interest in cognitive rehabilitation, acknowledging the impact of cognitive limitations on their daily functioning. They actively engaged in discussions about memory strategies, attention training, and problem-solving exercises. The patient demonstrated a readiness to explore environmental modifications and organizational tools to support cognitive functioning. Their engagement throughout the session and commitment to positive changes indicate a high potential for successful outcomes with the implementation of modifications and adaptive strategies.    P: Continue to attend PHP OT group sessions 5x week for 4 weeks to promote daily structure, social engagement, and opportunities to develop and utilize adaptive strategies to maximize functional performance in preparation for safe transition and integration back into school, work, and the community. Plan to address topic of tbd in next OT group session.                   OT Education - 02/21/22 2315     Education Details Navigating Life's Challenges with Modifications and Adaptive Strategies              OT Short Term Goals - 02/11/22 1919       OT SHORT TERM GOAL #1   Title Pt will be educated on strategies to improve psychosocial skills needed to participate fully in all daily, work, and leisure activities    Time 4    Period Weeks    Status New    Target Date 03/13/22      OT SHORT TERM GOAL #2   Title Pt will apply psychosocial skills and coping mechanisms to daily activities in order to function independently and reintegrate into community dwelling      OT SHORT TERM GOAL #3   Title Pt will recall and/or apply 1-3 sleep hygiene strategies to improve BADL routine when reintegrating into community       OT SHORT TERM GOAL #4   Title Pt will engage in goal setting to improve functional BADL/IADL routine upon reintegrating into community      OT SHORT TERM GOAL #5   Title Pt will choose and/or engage in 1-3 socially engaging leisure activities to improve social participation upon re integrating into community                      Plan - 02/21/22 2316     Psychosocial Skills Coping Strategies;Habits;Interpersonal Interaction;Routines and Behaviors             Patient will benefit from skilled therapeutic intervention in order to improve the following deficits and impairments:       Psychosocial Skills: Coping Strategies, Habits, Interpersonal Interaction, Routines and Behaviors   Visit Diagnosis: Difficulty coping    Problem List Patient Active Problem List   Diagnosis Date Noted   GAD (generalized anxiety disorder) 02/06/2022   MDD (major depressive disorder), recurrent severe, 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   Women's annual routine gynecological examination 12/25/2021   Encounter for birth control pills maintenance 11/22/2021   History of Chiari malformation 09/26/2021   Urinary urgency 09/26/2021   Common migraine with intractable migraine 07/01/2019   Intermittent headache 10/26/2018   Epigastric pain 10/26/2018  Gastroesophageal reflux disease 10/26/2018   Bipolar 2 disorder (HCC) 06/02/2018   Panic attacks 06/02/2018   Autism spectrum disorder 05/06/2018   Anxiety and depression 05/06/2018   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   Seizures (HCC) 04/09/2017   Tremor, 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    Ted Mcalpine, OT 02/21/2022, 11:16 PM  Kerrin Champagne, OT   [facility] PROGRAM [address] [place], [place], [address] Phone: [phone]   Fax:  [phone]  Name: Aishani Kalis MRN: [id number] Date of Birth: 1995/05/30

## 2022-02-21 NOTE — Progress Notes (Signed)
  [facility] Intensive Outpatient Program Discharge Summary  Jill Alexander [id number]  Admission date: 02/11/22 Discharge date: 02/22/22  Reason for admission: worsening depression and anxiety  Chemical Use History: Alcohol: Ocassionally cocktails Tobacco:Denies Illicit drugs-Denies Rehab YQ:[id number] Seizures, DUI's, DT's- h/o seizures  Family of Origin Issues:  Psych: Dad-depression Sister-depression and anxiety Grandmother-depression Grandfather-depression SA/HA: Denies   Progress in Program Toward Treatment Goals: Progressing  Progress (rationale): Patient is a 26 y.o. Caucasian female with past psychiatric history of MDD, autism spectrum disorder, GAD, OCD admitted to [facility] for worsening depression and anxiety.  Patient was enrolled in partial psychiatric program on 02/11/22.  Pt reports that her mood is "all right". She reports some benefit from [facility] program .  She reports that she is being discharged from [facility] tomorrow because her insurance is not covering anymore.  Currently, she denies any active suicidal ideations, homicidal ideations, auditory and visual hallucinations.  She still has chronic suicidal ideation without plan or intent.  She contracts for safety at this time.  She denies paranoia.  She denies any medication side effects and has been tolerating it well.  She reports that she feels like the medications are not helping her much with her depression but it helped with her panic attacks. She has been sleeping well but her appetite is still low.  Encouraged her to talk to her outpatient provider about any medication changes. She reports no change in her current stressors.  Patient is alert and oriented x 4,  calm, cooperative, and fully engaged in conversation during the encounter.  Her thought process is linear with coherent speech . She does not appear to be responding to internal/external stimuli. .    Plan  -Follow up with your outpatient provider Dr.  Loretta Plume with [facility]  (appointment on 11/16) and therapist Jodie Echevaria [facility] (appointment on 11/7). -Continue Prozac to 80 mg daily to help with depression and anxiety. -Continue Latuda 40 mg nightly -Continue Ativan 1 mg twice daily as needed for anxiety   Collaboration of Care: Other PHP team  Patient/Guardian was advised Release of Information must be obtained prior to any record release in order to collaborate their care with an outside provider. Patient/Guardian was advised if they have not already done so to contact the registration department to sign all necessary forms in order for [place] to release information regarding their care.   Consent: Patient/Guardian gives verbal consent for treatment and assignment of benefits for services provided during this visit. Patient/Guardian expressed understanding and agreed to proceed.   [facility] 02/21/2022

## 2022-02-21 NOTE — Progress Notes (Signed)
Spoke with patient via Webex video call, used 2 identifiers to correctly identify patient. States that groups are good. 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 Feels agitated and having an "off" day. Worried about her job and getting hours once she is done with groups. Feels hopeless and emotionally numb. On scale 1-10 as 10 being worst she rates depression at 3 anda anxiety at 10. Denies SI/HI or AV hallucinations. Will be discharged tomorrow. PHQ9=23.

## 2022-02-22 ENCOUNTER — Other Ambulatory Visit (HOSPITAL_COMMUNITY): Payer: No Typology Code available for payment source | Admitting: Licensed Clinical Social Worker

## 2022-02-22 ENCOUNTER — Other Ambulatory Visit (HOSPITAL_COMMUNITY): Payer: Self-pay

## 2022-02-22 ENCOUNTER — Other Ambulatory Visit (HOSPITAL_COMMUNITY): Payer: No Typology Code available for payment source

## 2022-02-22 DIAGNOSIS — F411 Generalized anxiety disorder: Secondary | ICD-10-CM

## 2022-02-22 DIAGNOSIS — F332 Major depressive disorder, recurrent severe without psychotic features: Secondary | ICD-10-CM

## 2022-02-22 DIAGNOSIS — R4589 Other symptoms and signs involving emotional state: Secondary | ICD-10-CM

## 2022-02-22 DIAGNOSIS — F840 Autistic disorder: Secondary | ICD-10-CM

## 2022-02-24 ENCOUNTER — Encounter (HOSPITAL_COMMUNITY): Payer: Self-pay

## 2022-02-24 NOTE — Therapy (Signed)
[facility] [place], [place], [address] Phone: [phone]   Fax:  [phone]  Occupational Therapy Treatment Virtual Visit via Video Note  I connected with [name] on 02/24/22 at  8:00 AM EDT by a video enabled telemedicine application and verified that I am speaking with the correct person using two identifiers.  Location: Patient: home Provider: office   I discussed the limitations of evaluation and management by telemedicine and the availability of in person appointments. The patient expressed understanding and agreed to proceed.    The patient was advised to call back or seek an in-person evaluation if the symptoms worsen or if the condition fails to improve as anticipated.  I provided 55 minutes of non-face-to-face time during this encounter.   Patient Details  Name: Jill Alexander MRN: [id number] Date of Birth: 05-08-1995 No data recorded  Encounter Date: 02/22/2022   OT End of Session - 02/24/22 2143     Visit Number 10    Number of Visits 20    Date for OT Re-Evaluation 03/13/22    OT Start Time 1200    OT Stop Time 1255    OT Time Calculation (min) 55 min             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    Past Surgical History:  Procedure Laterality Date   none      There were no vitals filed for this visit.   Subjective Assessment - 02/24/22 2142     Currently in Pain? No/denies    Pain Score 0-No pain                 Group Session:  S: I'm doing okay I guess  O: During the group therapy session, the occupational therapist discussed the impact of sleep disturbances on daily activities and overall  health and wellbeing.   The OT also reviewed various types of sleep disorders, including insomnia, sleep apnea, restless leg syndrome, and narcolepsy, and their associated symptoms. Strategies for managing and treating sleep disturbances were also discussed, such as establishing a consistent sleep routine, avoiding stimulants before bedtime, and engaging in relaxation techniques.   Today's group also included information on how sleep disturbances can cause fatigue, mood changes, cognitive impairment, and physical health problems, and emphasizes the importance of seeking prompt treatment to maintain overall health and wellbeing.   A: In today's session, the patient demonstrated active engagement with the topic of The Importance of Sleep. They eagerly asked questions, contributed personal experiences, and showcased a noticeable eagerness to apply the discussed principles. Their participation indicated not only a strong understanding of the subject matter but also an intrinsic motivation to implement better sleep practices in their daily routine. Based on their proactive involvement, it is assessed that the patient greatly benefited from today's treatment and will likely make efforts to incorporate the insights gained.   P: Continue to attend PHP OT group sessions 5x week for 4 weeks to promote daily structure, social engagement, and opportunities to develop and utilize adaptive strategies to maximize functional performance in preparation for safe transition and integration back into  school, work, and the community. Plan to address topic of pt 2 in next OT group session.                  OT Education - 02/24/22 2142     Education Details Sleep Group Tx              OT Short Term Goals - 02/11/22 1919       OT SHORT TERM GOAL #1   Title Pt will be educated on strategies to improve psychosocial skills needed to participate fully in all daily, work, and leisure activities    Time  4    Period Weeks    Status New    Target Date 03/13/22      OT SHORT TERM GOAL #2   Title Pt will apply psychosocial skills and coping mechanisms to daily activities in order to function independently and reintegrate into community dwelling      OT SHORT TERM GOAL #3   Title Pt will recall and/or apply 1-3 sleep hygiene strategies to improve BADL routine when reintegrating into community      OT SHORT TERM GOAL #4   Title Pt will engage in goal setting to improve functional BADL/IADL routine upon reintegrating into community      OT SHORT TERM GOAL #5   Title Pt will choose and/or engage in 1-3 socially engaging leisure activities to improve social participation upon re integrating into community                      Plan - 02/24/22 2143     Psychosocial Skills Coping Strategies;Habits;Interpersonal Interaction;Routines and Behaviors             Patient will benefit from skilled therapeutic intervention in order to improve the following deficits and impairments:       Psychosocial Skills: Coping Strategies, Habits, Interpersonal Interaction, Routines and Behaviors   Visit Diagnosis: Difficulty coping    Problem List Patient Active Problem List   Diagnosis Date Noted   GAD (generalized anxiety disorder) 02/06/2022   MDD (major depressive disorder), recurrent severe, 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   Women's annual routine gynecological examination 12/25/2021   Encounter for birth control pills maintenance 11/22/2021   History of Chiari malformation 09/26/2021   Urinary urgency 09/26/2021   Common migraine with intractable migraine 07/01/2019   Intermittent headache 10/26/2018   Epigastric pain 10/26/2018   Gastroesophageal reflux disease 10/26/2018   Bipolar 2 disorder (HCC) 06/02/2018   Panic attacks 06/02/2018   Autism spectrum disorder 05/06/2018   Anxiety and depression 05/06/2018   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    Seizures (HCC) 04/09/2017   Tremor, 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 [place], [place], [address] Phone: [phone]   Fax:  [phone]  Name: Jill Alexander MRN: [id number] Date of Birth: [birth year]

## 2022-02-25 ENCOUNTER — Other Ambulatory Visit (HOSPITAL_COMMUNITY): Payer: No Typology Code available for payment source

## 2022-02-26 ENCOUNTER — Other Ambulatory Visit (HOSPITAL_COMMUNITY): Payer: No Typology Code available for payment source

## 2022-02-26 ENCOUNTER — Ambulatory Visit: Payer: Medicaid Other | Admitting: Clinical

## 2022-02-26 ENCOUNTER — Ambulatory Visit: Payer: No Typology Code available for payment source | Admitting: Clinical

## 2022-02-26 ENCOUNTER — Ambulatory Visit (INDEPENDENT_AMBULATORY_CARE_PROVIDER_SITE_OTHER): Payer: No Typology Code available for payment source | Admitting: Clinical

## 2022-02-26 DIAGNOSIS — F840 Autistic disorder: Secondary | ICD-10-CM

## 2022-02-26 NOTE — Progress Notes (Signed)
Time: 9:00am -09:58 am CPT Code: 60109N-23 Diagnosis: F84.0  Jill Alexander was seen in person for individual therapy. Her mother had reached out via secure email prior to the visit to let the therapist know details of her health following discharge from the [facility] last week. Jill Alexander reported that she continues to experience passive suicidal ideation without plan or intent. Session focused on next steps. She was open to the idea of a joint session with her parents present to allow for their input in creation of a plan. Therapist discussed the idea of pursuing neurological and neuropsychiatric testing to understand underlying causes of communication challenges. Therapist also offered to reach out to Jill Alexander's psychiatrist to discuss medication management, and she agreed to provide written consent. She expressed interest in the idea of living in a supportive group home. She is scheduled to be seen again in on week.    [facility] Counselor/Therapist Progress Note   Patient ID: [name], MRN: [id number],     Individualized Treatment Plan Strengths: Enjoys reading, Seeking counseling, In training program through [facility], "I like humor and jokes"  Supports: Her dog, her bestfriend since childhood, her parents, her sister    Goal/Needs for Treatment:  In order of importance to patient 1) Emotional Regulation/Anxiety 2) Improve Positive self talk      Client Statement of Needs: "Find ways of being nicer to myself", cope w/ anxiety and worry    Treatment Level:Outpatient Counseling  Symptoms:Anxiety, Perseverative worry, Negative self talk  Client Treatment Preferences:weekly counseling, return to Dr. [name] in Feb 2023 Prefers to be called Jill Alexander    Healthcare consumer's goal for treatment:   [name], [facility] will support the patient's ability to achieve the goals identified. Cognitive Behavioral Therapy, Supportive Counseling, Mindfulness and Relaxation Strategies and other  evidenced-based practices will be used to promote progress towards healthy functioning.    Healthcare consumer will: Actively participate in therapy, working towards healthy functioning.     *Justification for Continuation/Discontinuation of Goal: R=Revised, O=Ongoing, A=Achieved, D=Discontinued   Goal 1) Jill Alexander will develop strategies to regulate her emotions and improve her positive self talk Likert rating baseline date 04/12/21: How Easy - 3; How Often - 50% Target Date Goal Was reviewed Status Code Progress towards goal/Likert rating  04/12/22   New Maintained                      Jill Alexander participated in development of tx plan and provided verbal consent.                  Myrtie Cruise, PhD               Myrtie Cruise, PhD               Myrtie Cruise, PhD

## 2022-02-27 ENCOUNTER — Encounter: Payer: Self-pay | Admitting: Neurology

## 2022-02-27 ENCOUNTER — Other Ambulatory Visit (HOSPITAL_COMMUNITY): Payer: Medicaid Other

## 2022-02-28 ENCOUNTER — Other Ambulatory Visit (HOSPITAL_COMMUNITY): Payer: Medicaid Other

## 2022-03-01 ENCOUNTER — Other Ambulatory Visit (HOSPITAL_COMMUNITY): Payer: Medicaid Other

## 2022-03-04 ENCOUNTER — Other Ambulatory Visit (HOSPITAL_COMMUNITY): Payer: Medicaid Other

## 2022-03-05 ENCOUNTER — Ambulatory Visit (INDEPENDENT_AMBULATORY_CARE_PROVIDER_SITE_OTHER): Payer: No Typology Code available for payment source | Admitting: Clinical

## 2022-03-05 ENCOUNTER — Other Ambulatory Visit (HOSPITAL_COMMUNITY): Payer: Medicaid Other

## 2022-03-05 DIAGNOSIS — F840 Autistic disorder: Secondary | ICD-10-CM | POA: Diagnosis not present

## 2022-03-05 NOTE — Progress Notes (Signed)
Time: 11:00am -11:58 am CPT Code: 29528U-13 Diagnosis: F84.0  Jill Alexander was seen remotely using secure video conferencing due to difficulty arranging transportation to session. She was in her home and the therapist was I her office at the time of the appointment. Therapist created a plan with her to call [facility] to arrange a weekly pick up to take her to sessions on Tuesdays, so that she doesn't have to remember to call every week. She had visited a group home and liked it, but was not in the age demographic of inhabitants. She has also been considering additional social outlets, such as a weekly dinner group and returning to [employer]. She is scheduled to be seen again in on week. [facility] Counselor/Therapist Progress Note   Patient ID: Jill Alexander, MRN: [id number],     Individualized Treatment Plan Strengths: Enjoys reading, Seeking counseling, In training program through [facility], "I like humor and jokes"  Supports: Her dog, her bestfriend since childhood, her parents, her sister    Goal/Needs for Treatment:  In order of importance to patient 1) Emotional Regulation/Anxiety 2) Improve Positive self talk      Client Statement of Needs: "Find ways of being nicer to myself", cope w/ anxiety and worry    Treatment Level:Outpatient Counseling  Symptoms:Anxiety, Perseverative worry, Negative self talk  Client Treatment Preferences:weekly counseling, return to Dr. [name] in Feb 2023 Prefers to be called Jill Alexander    Healthcare consumer's goal for treatment:   [name], [facility] will support the patient's ability to achieve the goals identified. Cognitive Behavioral Therapy, Supportive Counseling, Mindfulness and Relaxation Strategies and other evidenced-based practices will be used to promote progress towards healthy functioning.    Healthcare consumer will: Actively participate in therapy, working towards healthy functioning.     *Justification for  Continuation/Discontinuation of Goal: R=Revised, O=Ongoing, A=Achieved, D=Discontinued   Goal 1) Jill Alexander will develop strategies to regulate her emotions and improve her positive self talk Likert rating baseline date 04/12/21: How Easy - 3; How Often - 50% Target Date Goal Was reviewed Status Code Progress towards goal/Likert rating  04/12/22   New Maintained                      Jill Alexander participated in development of tx plan and provided verbal consent.                  Myrtie Cruise, PhD               Myrtie Cruise, PhD               Myrtie Cruise, PhD               Myrtie Cruise, PhD

## 2022-03-06 ENCOUNTER — Other Ambulatory Visit (HOSPITAL_COMMUNITY): Payer: Medicaid Other

## 2022-03-06 ENCOUNTER — Other Ambulatory Visit (HOSPITAL_COMMUNITY): Payer: Self-pay

## 2022-03-07 ENCOUNTER — Other Ambulatory Visit (HOSPITAL_COMMUNITY): Payer: Medicaid Other

## 2022-03-08 ENCOUNTER — Other Ambulatory Visit (HOSPITAL_COMMUNITY): Payer: Medicaid Other

## 2022-03-12 ENCOUNTER — Encounter: Payer: Self-pay | Admitting: Internal Medicine

## 2022-03-12 ENCOUNTER — Ambulatory Visit (INDEPENDENT_AMBULATORY_CARE_PROVIDER_SITE_OTHER): Payer: No Typology Code available for payment source | Admitting: Clinical

## 2022-03-12 DIAGNOSIS — F840 Autistic disorder: Secondary | ICD-10-CM | POA: Diagnosis not present

## 2022-03-12 NOTE — Progress Notes (Signed)
Time: 11:20 am -11:58 am CPT Code: 24268T-41 Diagnosis: F84.0  Jill Alexander was seen in person for therapy. Session started late due to a misunderstanding about hte need to check in for her session. Therapist asked Jill Alexander to please check in at the front desk as soon a she arrives so the therapist knows she is there, and she agreed to do this going forward. Session focused on creating a plan for Thanksgiving. She created a plan to take breaks as needed, plan to particpate in and suggest social activities that are relatively pleasant and easy for her (for example, watching movies), and stick to her regular routine (for example, maintain her usual sleep schedule) as much as possible. She is scheduled to be seen again in two weeks.  [facility] Counselor/Therapist Progress Note   Patient ID: Jill Alexander, MRN: [id number],     Individualized Treatment Plan Strengths: Enjoys reading, Seeking counseling, In training program through [facility], "I like humor and jokes"  Supports: Her dog, her bestfriend since childhood, her parents, her sister    Goal/Needs for Treatment:  In order of importance to patient 1) Emotional Regulation/Anxiety 2) Improve Positive self talk      Client Statement of Needs: "Find ways of being nicer to myself", cope w/ anxiety and worry    Treatment Level:Outpatient Counseling  Symptoms:Anxiety, Perseverative worry, Negative self talk  Client Treatment Preferences:weekly counseling, return to Dr. Dewayne Hatch in Feb 2023 Prefers to be called Jill Alexander    Healthcare consumer's goal for treatment:   [name], [facility] will support the patient's ability to achieve the goals identified. Cognitive Behavioral Therapy, Supportive Counseling, Mindfulness and Relaxation Strategies and other evidenced-based practices will be used to promote progress towards healthy functioning.    Healthcare consumer will: Actively participate in therapy, working towards healthy functioning.      *Justification for Continuation/Discontinuation of Goal: R=Revised, O=Ongoing, A=Achieved, D=Discontinued   Goal 1) Jill Alexander will develop strategies to regulate her emotions and improve her positive self talk Likert rating baseline date 04/12/21: How Easy - 3; How Often - 50% Target Date Goal Was reviewed Status Code Progress towards goal/Likert rating  04/12/22   New Maintained                      Jill Alexander participated in development of tx plan and provided verbal consent.                  Chrissie Noa, PhD               Chrissie Noa, PhD               Chrissie Noa, PhD               Chrissie Noa, PhD               Chrissie Noa, PhD

## 2022-03-14 ENCOUNTER — Other Ambulatory Visit (HOSPITAL_COMMUNITY): Payer: Self-pay

## 2022-03-14 ENCOUNTER — Telehealth (HOSPITAL_BASED_OUTPATIENT_CLINIC_OR_DEPARTMENT_OTHER): Payer: No Typology Code available for payment source | Admitting: Psychiatry

## 2022-03-14 DIAGNOSIS — F411 Generalized anxiety disorder: Secondary | ICD-10-CM

## 2022-03-14 DIAGNOSIS — F410 Panic disorder [episodic paroxysmal anxiety] without agoraphobia: Secondary | ICD-10-CM

## 2022-03-14 DIAGNOSIS — F840 Autistic disorder: Secondary | ICD-10-CM

## 2022-03-14 DIAGNOSIS — F332 Major depressive disorder, recurrent severe without psychotic features: Secondary | ICD-10-CM

## 2022-03-14 MED ORDER — LURASIDONE HCL 40 MG PO TABS
40.0000 mg | ORAL_TABLET | Freq: Every day | ORAL | 0 refills | Status: DC
  Filled 2022-03-14 – 2022-04-26 (×2): qty 90, 90d supply, fill #0
  Filled 2022-04-27: qty 30, 30d supply, fill #0
  Filled 2022-04-27: qty 60, 60d supply, fill #0

## 2022-03-14 MED ORDER — LORAZEPAM 1 MG PO TABS
1.0000 mg | ORAL_TABLET | Freq: Two times a day (BID) | ORAL | 0 refills | Status: DC | PRN
  Filled 2022-03-14 – 2022-03-18 (×2): qty 60, 30d supply, fill #0

## 2022-03-14 MED ORDER — AUVELITY 45-105 MG PO TBCR
EXTENDED_RELEASE_TABLET | ORAL | 0 refills | Status: DC
  Filled 2022-03-14 – 2022-03-22 (×5): qty 60, 30d supply, fill #0

## 2022-03-14 NOTE — Progress Notes (Signed)
Virtual Visit via Video Note  I connected with Jill Alexander on 03/14/22 at 10:00 AM EST by  a video enabled telemedicine application and verified that I am speaking with the correct person using two identifiers.  Location: Patient: at her dad's office Provider: office   I discussed the limitations of evaluation and management by telemedicine and the availability of in person appointments. The patient expressed understanding and agreed to proceed.  History of Present Illness: Jill Alexander shares she is doing ok. She has several family members coming over for Thanksgiving next week. Jill Alexander shares that her depression has been the same since our last visit in mid September. She has been taking the increased dose of Latuda and did the PHP program. While in the program her Prozac was increased. It is frustrating but she knows that it can take a long while for the medication to be effective. She denies any noticeable SE. Jill Alexander continues to feels depressed on a  near daily basis and the level is 10/10 (10 being the worst). She is endorsing ongoing anhedonia but things bring her joy or laugh most days for a little while. She is getting a sleep study next week. Her energy, concentration and appetite are poor. She has difficulty completing her sentences and getting her thoughts out. This has happened before but it has gotten worse in the last few months. Jill Alexander "spaces out a lot". She is endorsing daily negative self talk. She has near daily SI without plan or intent. She denies HI. Jill Alexander has ongoing panic attacks on a near daily basis that are sometimes due to stress and other times random. Her anxiety is high most days. It is difficul.t Jill Alexander has heard about a medication from her father that she wonders if it would help. He has been taking it and felt it has helped him called Auvelity. She has had 2 seizures in her lifetime 1 in 2014  and 1 in 2015 for unknown reasons.    Observations/Objective: Psychiatric  Specialty Exam: ROS  There were no vitals taken for this visit.There is no height or weight on file to calculate BMI.  General Appearance: Casual and Neat  Eye Contact:  Minimal  Speech:  Clear and Coherent and Slow  Volume:  Normal  Mood:  Anxious and Depressed  Affect:  Blunt  Thought Process:  Coherent and Descriptions of Associations: Circumstantial  Orientation:  Full (Time, Place, and Person)  Thought Content:  Logical  Suicidal Thoughts:  No  Homicidal Thoughts:  No  Memory:  Immediate;   Good  Judgement:  Good  Insight:  Good  Psychomotor Activity:  Normal  Concentration:  Concentration: Good  Recall:  Good  Fund of Knowledge:  Good  Language:  Good  Akathisia:  No  Handed:  Right  AIMS (if indicated):     Assets:  Communication Skills Desire for Improvement Financial Resources/Insurance Housing Resilience Social Support Talents/Skills Transportation Vocational/Educational  ADL's:  Intact  Cognition:  WNL  Sleep:        Assessment and Plan:     03/14/2022   10:14 AM 02/21/2022   10:37 AM 02/12/2022   10:57 AM 02/06/2022   10:48 AM 01/17/2022    3:27 PM  Depression screen PHQ 2/9  Decreased Interest 2 2 3 3    Down, Depressed, Hopeless 3 3 3 3 3   PHQ - 2 Score 5 5 6 6 3   Altered sleeping 1 1 3 3 3   Tired, decreased energy 3 3 3  3  3  Change in appetite 3 3 3 2  0  Feeling bad or failure about yourself  3 3 3 3 3   Trouble concentrating 3 3 3 3 3   Moving slowly or fidgety/restless 2 2 2 2  0  Suicidal thoughts 3 3 3 3  0  PHQ-9 Score 23 23 26 25 15   Difficult doing work/chores Extremely dIfficult  Extremely dIfficult Very difficult Very difficult    Flowsheet Row Video [facility] from [facility] Counselor from 02/21/2022 in [facility] Counselor from 02/12/2022 in [facility]  C-SSRS RISK CATEGORY Error: Q7 should not be  populated when Q6 is No Error: Question 6 not populated Error: Question 6 not populated         Pt is aware that these meds carry a teratogenic risk. Pt will discuss plan of action if she does or plans to become pregnant in the future.  Status of current problems: ongoing depression symptoms  Meds: d/c Prozac  Start Auvelity for depression Previous failed med trials: Prozac, Nortriptyline, Clomipramine, Emsam, Trintellix, Wellbutrin, Abilify, Lithium 1. MDD (major depressive disorder), recurrent severe, without psychosis (HCC) - Dextromethorphan-buPROPion ER (AUVELITY) 45-105 MG TBCR; Take 1 tablet by mouth daily in the afternoon for 3 days, THEN 1 tablet 2 (two) times daily for 27 days.  Dispense: 60 tablet; Refill: 0 - lurasidone (LATUDA) 40 MG TABS tablet; Take 1 tablet (40 mg total) by mouth daily with breakfast.  Dispense: 90 tablet; Refill: 0  2. GAD (generalized anxiety disorder) - LORazepam (ATIVAN) 1 MG tablet; Take 1 tablet (1 mg total) by mouth 2 (two) times daily as needed for anxiety.  Dispense: 60 tablet; Refill: 0 - lurasidone (LATUDA) 40 MG TABS tablet; Take 1 tablet (40 mg total) by mouth daily with breakfast.  Dispense: 90 tablet; Refill: 0  3. Panic attacks - LORazepam (ATIVAN) 1 MG tablet; Take 1 tablet (1 mg total) by mouth 2 (two) times daily as needed for anxiety.  Dispense: 60 tablet; Refill: 0 - lurasidone (LATUDA) 40 MG TABS tablet; Take 1 tablet (40 mg total) by mouth daily with breakfast.  Dispense: 90 tablet; Refill: 0  4. Autism spectrum disorder  She has a history of 2 seizures and the last time was in 2015.She is aware that Auvelity could caused seizures but would like to try it because her dad had a good response. She is taking Lamictal and Topamax for seizure prevention.       Therapy: brief supportive therapy provided. Discussed psychosocial stressors in detail.      Collaboration of Care: Other none  Patient/Guardian was advised Release of  Information must be obtained prior to any record release in order to collaborate their care with an outside provider. Patient/Guardian was advised if they have not already done so to contact the registration department to sign all necessary forms in order for to release information regarding their care.   Consent: Patient/Guardian gives verbal consent for treatment and assignment of benefits for services provided during this visit. Patient/Guardian expressed understanding and agreed to proceed.       Follow Up Instructions: Follow up in 3-4 weeks or sooner if needed    I discussed the assessment and treatment plan with the patient. The patient was provided an opportunity to ask questions and all were answered. The patient agreed with the plan and demonstrated an understanding of the instructions.   The patient was advised to call back or seek  an in-person evaluation if the symptoms worsen or if the condition fails to improve as anticipated.  I provided 29 minutes of non-face-to-face time during this encounter.   Oletta Darter, MD

## 2022-03-15 ENCOUNTER — Other Ambulatory Visit (HOSPITAL_COMMUNITY): Payer: Self-pay

## 2022-03-18 ENCOUNTER — Encounter: Payer: Self-pay | Admitting: Neurology

## 2022-03-18 ENCOUNTER — Other Ambulatory Visit (HOSPITAL_COMMUNITY): Payer: Self-pay

## 2022-03-18 ENCOUNTER — Ambulatory Visit (INDEPENDENT_AMBULATORY_CARE_PROVIDER_SITE_OTHER): Payer: No Typology Code available for payment source | Admitting: Neurology

## 2022-03-18 VITALS — BP 91/49 | HR 58 | Ht 62.0 in | Wt 124.0 lb

## 2022-03-18 DIAGNOSIS — G478 Other sleep disorders: Secondary | ICD-10-CM | POA: Diagnosis not present

## 2022-03-18 DIAGNOSIS — F0630 Mood disorder due to known physiological condition, unspecified: Secondary | ICD-10-CM

## 2022-03-18 DIAGNOSIS — G935 Compression of brain: Secondary | ICD-10-CM | POA: Insufficient documentation

## 2022-03-18 DIAGNOSIS — R5382 Chronic fatigue, unspecified: Secondary | ICD-10-CM | POA: Diagnosis not present

## 2022-03-18 DIAGNOSIS — G4719 Other hypersomnia: Secondary | ICD-10-CM

## 2022-03-18 DIAGNOSIS — R0683 Snoring: Secondary | ICD-10-CM

## 2022-03-18 DIAGNOSIS — F840 Autistic disorder: Secondary | ICD-10-CM

## 2022-03-18 DIAGNOSIS — R5381 Other malaise: Secondary | ICD-10-CM | POA: Insufficient documentation

## 2022-03-18 MED ORDER — ALPRAZOLAM 0.5 MG PO TABS
0.5000 mg | ORAL_TABLET | Freq: Every evening | ORAL | 0 refills | Status: DC | PRN
  Filled 2022-03-18: qty 2, 2d supply, fill #0

## 2022-03-18 NOTE — Patient Instructions (Signed)
Living With Autism Spectrum Disorder Autism spectrum disorder (ASD) is a group of developmental disorders that start during childhood. They affect how someone communicates, interacts with others, and behaves. ASD affects each person in different ways. It is hard to find treatments that work for all people who have ASD. If you have been diagnosed with ASD, you may be relieved to now know why you have felt different or behaved a certain way. You may also have questions about the treatment ahead, how to get the support you need, and how to deal with ASD from day to day. With treatment and support, you can live a full life with ASD and manage your symptoms. What actions can I take to manage autism spectrum disorder? Manage stress     Stress is your body's reaction to life changes and events, both good and bad. Stress can last for just a few hours. It can also be ongoing. Talk with your health care provider or therapist if you would like to learn more about ways to reduce your stress. Choose a method of lowering stress (stress reduction technique) that fits your lifestyle and personality, such as: Muscle relaxation. To do this, tense your muscles on purpose and then relax them. Keeping a stress diary. This can help you learn what causes your stress to start (figure out your triggers). It can also help you learn how to control your response to those triggers. Adding humor to your life by watching funny films or TV shows. Getting plenty of sleep. Doing things that help you relax, such as exercise, music, or art. Practicing mindfulness, yoga, or deep breathing. Eating a healthy diet.  Take your medicine Your health care provider may prescribe medicine to treat other conditions you have. These conditions may include: Anxiety or depression. Aggression or feeling irritable. Being unable to pay attention (inattention). Being overly active (hyperactivity). Sleep problems. Repeating physical or mental acts  that you feel you have to do (compulsive behavior). Seizures. Talk with your pharmacist or health care provider about: All medicines that you take. The side effects they may cause. Which medicines are safe to take together. Tell your health care provider or pharmacist if your medicines are causing any side effects. Learn social skills Your health care provider may suggest therapy that teaches you social skills. With social skills training, you can: Learn about social cues and how to watch for them. Better understand nonverbal communication, such as facial expressions and body language. Learn the right responses for social situations. Improve your friendships. How to recognize changes in your condition Living with ASD is different for everyone. You and your health care provider will work together to decide next steps in your treatment plan. Talk with your health care provider if you feel that your symptoms are getting worse. Follow these instructions at home: Learn as much as you can about ASD. Make sure you understand it. Follow your treatment plan as told. Make it your goal to take part in all treatment decisions (shared decision-making). Shared decision-making with your health care provider should be part of your total treatment plan. Work closely with your health care providers and family to get the therapies you need. Do not use alcohol and other substances that may keep your medicines from working properly. Take over-the-counter and prescription medicines only as told by your health care provider. Check with your health care provider before taking any new medicines. Keep all follow-up visits. Treatment is more effective when started early. Where to find support Talking  to others Trusted friends and family can offer support and guidance. Reach out to them to explain your condition and how you are feeling. Mention that you are working with your health care provider. Start by telling them  about any of your behaviors that are symptoms of ASD. Your diagnosis could help them understand why you sometimes have a hard time connecting with friends or family. It is important for your family and close friends to learn as much as they can about your ASD. This will help them understand your behavior and help you as needed. Finances There is help for dealing with the costs of living with ASD. Some resources include not-for-profit organizations and [employer]. You should also check with your insurance carrier to learn what ASD treatment is covered by your plan. If you are taking medicines, you may be able to get the generic forms. These may cost less than brand-name medicines. Some makers of prescription medicines also offer help to people who cannot afford the medicines that they need. Therapy and support groups Your health care provider may recommend behavioral, educational, or social skills therapies. These involve working with a Warden/ranger, Child psychotherapist, Chiropractor, or other mental health provider. Therapy may help you reduce how severe your ASD symptoms are. It may also help you manage symptoms of other problems that involve your behavior or emotions. Where to find more information Centers for Disease Control and Prevention Insurance claims handler): FootballExhibition.com.br [employer] of Mental Health: http://www.maynard.net/ World Health Organization: https://castaneda-walker.com/ American Speech-Language-Hearing Association (ASHA): www.asha.org Autism Society: www.autism-society.org Asperger/Autism Network (AANE): www.aane.org Contact a health care provider if: You develop new symptoms. Your symptoms get worse or do not get better with treatment. Get help right away if: You are acting in ways that harm yourself or others. You have thoughts of hurting yourself or others. Get help right away if you feel like you may hurt yourself or others, or have thoughts about taking your own life. Go to your nearest  emergency room or: Call 911. Call the National Suicide Prevention Lifeline at 651-361-2843 or 988. This is open 24 hours a day. Text the Crisis Text Line at (754)761-6199. Summary You can live a full life with ASD and manage your symptoms. A treatment plan can help you improve your symptoms and manage stress. It may include behavior therapy, social skills training, and medicines. Your health care provider, friends, and family can give you support. There are also many not-for-profit and government resources to help people with ASD. This information is not intended to replace advice given to you by your health care provider. Make sure you discuss any questions you have with your health care provider. Document Revised: 07/26/2021 Document Reviewed: 07/26/2021 Elsevier Patient Education  2023 [employer].

## 2022-03-18 NOTE — Progress Notes (Signed)
SLEEP MEDICINE CLINIC    Provider:  Melvyn Novas, MD  Primary Care Physician:  Lexine Baton No address on file     Referring Provider: Lexine Baton No address on file          Chief Complaint according to patient   Patient presents with:     New Patient (Initial Visit)     Here for snoring , referred by PCP.   Awaiting a separate work up for aphasic  spells over the last 8 months, autism spectrum disorder.       HISTORY OF PRESENT ILLNESS:  Jill Alexander is a 26 y.o. year old  Caucasian female patient seen on 03/18/2022 for snoring, sleep evaluation in the setting of autism.  Chief concern according to patient : " Loud snoring according to mother, excessive daytime sleepiness and fatigue, witnessed apnea. Patient is using caffeine to keep awake in daytime. Sleeping 8 hours at night".     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    Sleep relevant medical history: " Nocturia 1-2, no Tonsillectomy, no ENT problems, low hearing loss,"   Family medical /sleep history: No other family member on CPAP with OSA, insomnia, sleep walkers.    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 Pets are present. One dog and one cat. Tobacco use-none.  ETOH use ; none, Caffeine intake in form of Coffee( 2 cups in AM ) Soda( /) Tea ( /) or energy drinks. Regular exercise in form of walking.   Hobbies : reading.     Sleep habits are as follows: The patient's dinner time is between 6.30 PM. The patient goes to bed at 9-11 PM and continues to sleep for 4-8 hours, wakes for 1-2 bathroom breaks. The bedroom is cool, quiet and dark.    The  preferred sleep position is supine , with the support of 2-3 pillows.  Dreams are reportedly frequent , feeling real but not too vivid. She feels the best sleep is in the morning hours.    8-9 AM is the usual rise time. The patient wakes up at various times- spontaneously at 9 AM and she sleeps through alarms.  She reports not feeling refreshed or restored in AM, with symptoms such as dry mouth, morning headaches, and residual fatigue.  Naps are taken frequently, lasting from 60 to 180 minutes and are not refreshing .    Review of Systems: Out of a complete 14 system review, the patient complains of only the following symptoms, and all other reviewed systems are negative.:   Depression, anxiety, Autism.  Fatigue, sleepiness , snoring, fragmented sleep,    How likely are you to doze in the following situations: 0 = not likely, 1 = slight chance, 2 = moderate chance, 3 = high chance   Sitting and Reading? Watching Television? Sitting inactive in a public place (theater or meeting)? As a passenger in a car for an hour without a break? Lying down in the afternoon when circumstances permit? Sitting and talking to someone? Sitting quietly after lunch without alcohol? In a car, while stopped for a few minutes in traffic?   Total =  15 / 24 points   FSS endorsed at  51/ 63 points.   Depression is actively treated, therapist and medication. .   Social History   Socioeconomic History   Marital status: Single    Spouse name: Not on file   Number of children: 0   Years of education: Not on file   Highest education level: Some college, no degree  Occupational History   Not on file  Tobacco Use   Smoking status: Never   Smokeless tobacco: Never  Vaping Use   Vaping Use: Never used  Substance and Sexual Activity   Alcohol use: Yes    Comment: 1 cocktail a few times a year   Drug use: No   Sexual activity: Not Currently    Birth control/protection: Pill  Other Topics Concern   Not on  file  Social History Narrative   Lives with parents in [place] with her mom and dad. Pt moved here from [place] in 2018.      Siblings- Pt is the youngest of out 3 siblings. 8 sister 71 years older than her, 1 brother who is 53 years older than her.    Schooling- started college right after HS graduation and stopped after a year   Married- denies    Kids- denies    Legal issues- denies       Caffeine use: 2-3 cups per day   Left handed    Social Determinants of Health   Financial Resource Strain: Low Risk  (05/27/2018)   Overall Financial Resource Strain (CARDIA)    Difficulty of Paying Living Expenses: Not hard at all  Food Insecurity: No Food Insecurity (05/27/2018)   Hunger Vital Sign    Worried About Running Out of Food in the Last Year: Never true    [place] in the Last Year: Never true  Transportation Needs: Unmet Transportation Needs (06/29/2018)   PRAPARE - Transportation    Lack of Transportation (Medical): Yes    Lack of Transportation (Non-Medical): Yes  Physical Activity: Unknown (06/29/2018)   Exercise Vital Sign    Days of Exercise per Week: 3 days    Minutes of Exercise per Session: Not on file  Recent Concern: Physical Activity - Insufficiently Active (05/27/2018)   Exercise Vital Sign    Days of Exercise per Week: 2 days    Minutes of Exercise per Session: 30 min  Stress: Stress Concern Present (06/29/2018)   [place]    Feeling of Stress : Rather much  Social Connections: Unknown (06/29/2018)   Social Connection and Isolation Panel [NHANES]    Frequency of Communication with Friends and Family: Not on file    Frequency of Social Gatherings with Friends and Family: Not on file    Attends Religious Services: Not on file    Active Member of Clubs or Organizations: Not on file    Attends Archivist Meetings: Not on file    Marital Status: Never married    Family History  Problem  Relation Age of Onset   Depression Father    Depression Sister    Anxiety disorder Sister    Diabetes Maternal Grandfather    Cancer Maternal Grandfather    Breast cancer Paternal Grandmother    Uterine cancer Paternal Grandmother    Irritable bowel syndrome Paternal Grandmother    Colon cancer Neg Hx    Esophageal cancer Neg Hx    Stomach cancer Neg Hx    Rectal cancer Neg Hx  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    Past Surgical History:  Procedure Laterality Date   none       Current Outpatient Medications on File Prior to Visit  Medication Sig Dispense Refill   acetaminophen (TYLENOL) 500 MG tablet Take 500 mg by mouth every 6 (six) hours as needed.     Dextromethorphan-buPROPion ER (AUVELITY) 45-105 MG TBCR Take 1 tablet by mouth daily in the afternoon for 3 days, THEN 1 tablet 2 (two) times daily 60 tablet 0   Docusate Sodium (COLACE PO) Take by mouth.     drospirenone-ethinyl estradiol (YAZ) 3-0.02 MG tablet Take 1 tablet by mouth daily. 28 tablet 11   Erenumab-aooe (AIMOVIG) 140 MG/ML SOAJ Inject 140 mg into the skin every 30 (thirty) days. 1 mL 11   Famotidine (PEPCID PO) Take by mouth.     Ibuprofen (MOTRIN PO) Take 200 mg by mouth as needed.     lamoTRIgine (LAMICTAL) 200 MG tablet Take 1 tablet (200 mg total) by mouth 2 (two) times daily for seizures and depression. 60 tablet 2   LORazepam (ATIVAN) 1 MG tablet Take 1 tablet (1 mg total) by mouth 2 (two) times daily as needed for anxiety. 60 tablet 0   lurasidone (LATUDA) 40 MG TABS tablet Take 1 tablet (40 mg total) by mouth daily with breakfast. 90 tablet 0   melatonin 1 MG TABS tablet Take 1 tablet (1 mg total) by mouth at bedtime as needed (insomnia). 30 tablet 0    mirabegron ER (MYRBETRIQ) 50 MG TB24 tablet Take 1 tablet (50 mg total) by mouth daily. 30 tablet 3   Multiple Vitamins-Minerals (MULTIVITAMIN PO) Take 1 tablet by mouth daily.     omeprazole (PRILOSEC) 20 MG capsule Take 1 capsule (20 mg total) by mouth daily. 30 capsule 5   polyethylene glycol (MIRALAX) packet Take 17 g by mouth daily as needed. 14 each 0   rizatriptan (MAXALT) 10 MG tablet Take 1 tablet (10 mg total) by mouth as needed for migraine. May repeat in 2 hours if needed. 10 tablet 11   topiramate (TOPAMAX) 50 MG tablet Take 3 tablets (150 mg total) by mouth at bedtime. 270 tablet 0   [DISCONTINUED] Norethin Ace-Eth Estrad-FE (TAYTULLA) 1-20 MG-MCG(24) CAPS TAKE 1 CAPSULE BY MOUTH DAILY. 28 capsule 11   No current facility-administered medications on file prior to visit.    Allergies  Allergen Reactions   Gluten Meal     Physical exam:  Today's Vitals   03/18/22 1250  BP: (!) 91/49  Pulse: (!) 58  Weight: 124 lb (56.2 kg)  Height: 5\' 2"  (1.575 m)   Body mass index is 22.68 kg/m.   Wt Readings from Last 3 Encounters:  03/18/22 124 lb (56.2 kg)  12/25/21 124 lb (56.2 kg)  11/20/21 125 lb (56.7 kg)     Ht Readings from Last 3 Encounters:  03/18/22 5\' 2"  (1.575 m)  12/25/21 5\' 2"  (1.575 m)  11/20/21 5\' 2"  (1.575 m)      General: The patient is awake, alert and appears not in acute distress. The patient is well groomed. Head: Normocephalic, atraumatic. Neck is supple.  Mallampati  1,  neck circumference:13 inches . Nasal airflow patent.   Retrognathia is present- mild .  Dental status: biological , no braces.  Cardiovascular:  Regular rate and cardiac rhythm by pulse,  without distended neck veins. Respiratory: Lungs are clear to auscultation.  Skin:  Without evidence of ankle edema, or rash. Trunk: The patient's posture is not erect.    Neurologic exam : The patient is awake and alert, oriented to place and time.   Memory subjective described as intact.   Attention span & concentration ability appears restricted  Speech is fluent,  without  dysarthria, dysphonia or aphasia.  Mood and affect are anxious.  Racing thoughts, sometimes insomnia.    Cranial nerves: no loss of smell or taste reported  Pupils are equal and briskly reactive to light. Funduscopic exam deferred. She doesn't make eye contact.  Extraocular movements in vertical and horizontal planes were intact and without nystagmus. No Diplopia. Visual fields by finger perimetry are intact. Hearing was intact to soft voice and finger rubbing.    Facial sensation intact to fine touch.  Facial motor strength is symmetric /tongue and uvula move midline.  Neck ROM : rotation, tilt and flexion extension were normal for age and shoulder shrug was symmetrical.    Motor exam:  Symmetric bulk, tone and ROM.   Normal tone without cog wheeling, symmetric grip strength .   Sensory:  Fine touch, pinprick and vibration were tested  and  normal.  Proprioception tested in the upper extremities was normal.   Coordination: Rapid alternating movements in the fingers/hands were of normal speed.  The Finger-to-nose maneuver was intact without evidence of ataxia, dysmetria or tremor.   Gait and station: Patient could rise unassisted from a seated position, walked without assistive device.  Stance is of normal width/ base, gait is robotic- abrupt-  and the patient turned with 3 steps.  Toe and heel walk were deferred.  Deep tendon reflexes: in the  upper and lower extremities are symmetric and intact.  Babinski response was deferred .       After spending a total time of  45  minutes face to face and additional time for physical and neurologic examination, review of laboratory studies,  personal review of imaging studies, reports and results of other testing and review of referral information / records as far as provided in visit, I have established the following assessments:   Autism/ mental health  ) She has been treated with Lamictal for depression Ativan for anxiety Topamax for migraine prevention, Maxalt for migraine intervention, Abilify at bedtime to help with sleep and to boost the antidepressant capacity of her other medications.  She has been on Aimovig a migraine preventative.  She is on Prozac.  1)  This  patient with autism  presents today with excessive daytime sleepiness and a high degree of fatigue, she reports on some nights having difficulty falling asleep partially due to racing thoughts and anxiety but also having some rather irregular habits as to her time to go to bed and wake up in the morning.  She does snore according to mom's observation and mother also has described apneic events.  2) fatigue score is high and there is some malaise.  The patient is currently treated with medication for depression as well and is undergoing therapy on a regular basis.   3) sleep hygiene could be improved and will hopefully help to reduce morning headaches, malaise fatigue and excessive daytime sleepiness and also a presumed level  of frustration about sleep not being qualitatively what it should be.  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 Her neurologist was in [place].    My Plan is to proceed with:  1) I wanted to order an attended sleep study with a female employee. The patient has trouble to sleep I a strange place, and is cautiously resistant -   2) we agreed to do an in-lab study , mother will accompany her. This will address diaphoresis , palpitations, and apnea/  also ruling out hypoxia as a cause of morning headaches. It is important to recognize the  autism related anxiety and intolerance of things touching her body or face. Not a FFM candidate.  3) xanax ordered for sleep facilitation. Mood may also improve if sleep improves.   I would like to thank Lexine Baton and Jake [employer]ark[facility] No address on file for allowing me to meet  with and to take care of this pleasant patient.   In short, Jill Alexander is presenting with witnessed apnea, snoring and EDS, in autism-   I plan to follow up either personally or through our NP within 2-4 months.     Electronically signed by: Melvyn Novas, MD 03/18/2022 1:12 PM  [facility] Board certified by The [employer] of Sleep Medicine and Diplomate of the [employer] of Sleep Medicine. Board certified In Neurology through the ABPN, Fellow of the [employer] of Neurology. Medical Director of [facility].

## 2022-03-19 ENCOUNTER — Ambulatory Visit: Payer: No Typology Code available for payment source | Admitting: Clinical

## 2022-03-20 ENCOUNTER — Telehealth (HOSPITAL_COMMUNITY): Payer: Self-pay

## 2022-03-20 ENCOUNTER — Ambulatory Visit (INDEPENDENT_AMBULATORY_CARE_PROVIDER_SITE_OTHER): Payer: No Typology Code available for payment source | Admitting: Clinical

## 2022-03-20 DIAGNOSIS — F840 Autistic disorder: Secondary | ICD-10-CM | POA: Diagnosis not present

## 2022-03-20 NOTE — Progress Notes (Addendum)
Time: 9:00 am -9:58 am CPT Code: 09628Z-66 Diagnosis: F84.0  Jill Alexander was seen remotely using secure video conferencing. She was in her home and the therapist was in her home at the time of the appointment. She shared that she had been struggling with symptoms of depression, but suicidal ideation and intent were denied. Session focused on coping strategies for the holidays. She created a plan to go on a walk after session. She is scheduled to be seen again in one week.  [facility] Counselor/Therapist Progress Note   Patient ID: [name], MRN: [id number],     Individualized Treatment Plan Strengths: Enjoys reading, Seeking counseling, In training program through [employer], "I like humor and jokes"  Supports: Her dog, her bestfriend since childhood, her parents, her sister    Goal/Needs for Treatment:  In order of importance to patient 1) Emotional Regulation/Anxiety 2) Improve Positive self talk      Client Statement of Needs: "Find ways of being nicer to myself", cope w/ anxiety and worry    Treatment Level:Outpatient Counseling  Symptoms:Anxiety, Perseverative worry, Negative self talk  Client Treatment Preferences:weekly counseling, return to Dr. Dewayne Hatch in Feb 2023 Prefers to be called Jill Alexander    Healthcare consumer's goal for treatment:   [name], [facility] will support the patient's ability to achieve the goals identified. Cognitive Behavioral Therapy, Supportive Counseling, Mindfulness and Relaxation Strategies and other evidenced-based practices will be used to promote progress towards healthy functioning.    Healthcare consumer will: Actively participate in therapy, working towards healthy functioning.     *Justification for Continuation/Discontinuation of Goal: R=Revised, O=Ongoing, A=Achieved, D=Discontinued   Goal 1) Jill Alexander will develop strategies to regulate her emotions and improve her positive self talk Likert rating baseline date 04/12/21: How  Easy - 3; How Often - 50% Target Date Goal Was reviewed Status Code Progress towards goal/Likert rating  04/12/22   New Maintained                      Jill Alexander participated in development of tx plan and provided verbal consent.                  Chrissie Noa, PhD               Chrissie Noa, PhD               Chrissie Noa, PhD               Chrissie Noa, PhD               Chrissie Noa, PhD               Chrissie Noa, PhD

## 2022-03-20 NOTE — Telephone Encounter (Signed)
PA initiated for Auvelity 45-105 mg ER via Covermymeds Message from [employer]: The request has been approved. The authorization is effective from 03/20/2022 to 05/19/2022, as long as the member is enrolled in their current health plan. The request was approved with a quantity restriction. This has been approved for a max daily dosage of 2. A written notification letter will follow with additional details.

## 2022-03-22 ENCOUNTER — Other Ambulatory Visit (HOSPITAL_COMMUNITY): Payer: Self-pay

## 2022-03-26 ENCOUNTER — Ambulatory Visit (INDEPENDENT_AMBULATORY_CARE_PROVIDER_SITE_OTHER): Payer: No Typology Code available for payment source | Admitting: Clinical

## 2022-03-26 DIAGNOSIS — F840 Autistic disorder: Secondary | ICD-10-CM | POA: Diagnosis not present

## 2022-03-26 NOTE — Progress Notes (Signed)
Time: 11:00 am -11:58 am CPT Code: 95188C-16 Diagnosis: F84.0  Jill Alexander was seen in person for therapy. She shared that she had had a difficult week during which she had lost her wallet and charges had been made on her credit card. Therapist provided validation and support, normalizing her experience with examples of situations wherein the therapist and others she had known had lost their wallets. Session focused on considering how Jill Alexander can access a sense of purpose and belonging. She reflected upon the last time she had felt these things, which was when she was participating in the [facility] program last year. Therapist pointed out the ways in which these feelings are likely still attainable to her. Suicidal ideation and intent were denied. She is scheduled to be seen again in one week.  [facility] Counselor/Therapist Progress Note   Patient ID: Jill Alexander, MRN: [id number],     Individualized Treatment Plan Strengths: Enjoys reading, Seeking counseling, In training program through [facility], "I like humor and jokes"  Supports: Her dog, her bestfriend since childhood, her parents, her sister    Goal/Needs for Treatment:  In order of importance to patient 1) Emotional Regulation/Anxiety 2) Improve Positive self talk      Client Statement of Needs: "Find ways of being nicer to myself", cope w/ anxiety and worry    Treatment Level:Outpatient Counseling  Symptoms:Anxiety, Perseverative worry, Negative self talk  Client Treatment Preferences:weekly counseling, return to Dr. Dewayne Hatch in Feb 2023 Prefers to be called Jill Alexander    Healthcare consumer's goal for treatment:   [name], [facility] will support the patient's ability to achieve the goals identified. Cognitive Behavioral Therapy, Supportive Counseling, Mindfulness and Relaxation Strategies and other evidenced-based practices will be used to promote progress towards healthy functioning.    Healthcare consumer will:  Actively participate in therapy, working towards healthy functioning.     *Justification for Continuation/Discontinuation of Goal: R=Revised, O=Ongoing, A=Achieved, D=Discontinued   Goal 1) Jill Alexander will develop strategies to regulate her emotions and improve her positive self talk Likert rating baseline date 04/12/21: How Easy - 3; How Often - 50% Target Date Goal Was reviewed Status Code Progress towards goal/Likert rating  04/12/22   New Maintained                      Jill Alexander participated in development of tx plan and provided verbal consent.                  Chrissie Noa, PhD               Chrissie Noa, PhD               Chrissie Noa, PhD               Chrissie Noa, PhD               Chrissie Noa, PhD               Chrissie Noa, PhD               Chrissie Noa, PhD

## 2022-03-27 ENCOUNTER — Telehealth (HOSPITAL_COMMUNITY): Payer: Self-pay | Admitting: *Deleted

## 2022-03-27 ENCOUNTER — Other Ambulatory Visit: Payer: Self-pay | Admitting: Neurology

## 2022-03-27 NOTE — Telephone Encounter (Signed)
Yes she should have stopped the Prozac at out last visit. If she has been off of it for a week or more than she can start the new medication right away.

## 2022-03-27 NOTE — Telephone Encounter (Signed)
I will advise pt. Thanks.

## 2022-03-27 NOTE — Telephone Encounter (Signed)
Pt called stating the pharmacist at her pharmacy told her she should be tapering off the Prozac as she starts the [place]. Prozac was d/c on pt last visit 03/11/22 and Auvelity ordered. Pt has an upcoming appointment on 04/11/22. Please review and advise. Thank you.

## 2022-03-28 ENCOUNTER — Other Ambulatory Visit (HOSPITAL_COMMUNITY): Payer: Self-pay

## 2022-03-28 MED ORDER — LAMOTRIGINE 200 MG PO TABS
200.0000 mg | ORAL_TABLET | Freq: Two times a day (BID) | ORAL | 2 refills | Status: DC
  Filled 2022-03-28: qty 60, 30d supply, fill #0
  Filled 2022-04-26: qty 60, 30d supply, fill #1
  Filled 2022-04-27: qty 60, 30d supply, fill #0
  Filled 2022-05-26: qty 60, 30d supply, fill #1

## 2022-03-29 ENCOUNTER — Other Ambulatory Visit (HOSPITAL_COMMUNITY): Payer: Self-pay

## 2022-04-02 ENCOUNTER — Telehealth: Payer: Self-pay | Admitting: Neurology

## 2022-04-02 ENCOUNTER — Ambulatory Visit (INDEPENDENT_AMBULATORY_CARE_PROVIDER_SITE_OTHER): Payer: No Typology Code available for payment source | Admitting: Clinical

## 2022-04-02 DIAGNOSIS — F840 Autistic disorder: Secondary | ICD-10-CM

## 2022-04-02 NOTE — Telephone Encounter (Signed)
Pt called wanting to know if she can get an update on when she will be scheduled her in lab sleep study. Please advise.

## 2022-04-02 NOTE — Progress Notes (Signed)
Time: 11:00 am -11:58 am CPT Code: 41287O-67 Diagnosis: F84.0  Jill Alexander was seen in person for therapy. Session began by working out scheduling complications, and discussing an incident over the weekend during which Jill Alexander had reached out via secure email to cancel her next two appointments. Therapist obtained Jill Alexander's consent to reach out to her mother and notify her of the changes via secure email due to Jill Alexander's recent suicidality. Jill Alexander had ultimately opted to keep the appointments, and therapist had consulted with Jill Alexander's mother the afternoon of 12/4 to discuss treatment options and ensure Jill Alexander's safety. She discussed the idea of incorporating exercise, but was hesitant about strategies to help her make it to the gym. She is scheduled to be seen again in one week.    [facility] Counselor/Therapist Progress Note   Patient ID: [name], MRN: [id number],     Individualized Treatment Plan Strengths: Enjoys reading, Seeking counseling, In training program through [facility], "I like humor and jokes"  Supports: Her dog, her bestfriend since childhood, her parents, her sister    Goal/Needs for Treatment:  In order of importance to patient 1) Emotional Regulation/Anxiety 2) Improve Positive self talk      Client Statement of Needs: "Find ways of being nicer to myself", cope w/ anxiety and worry    Treatment Level:Outpatient Counseling  Symptoms:Anxiety, Perseverative worry, Negative self talk  Client Treatment Preferences:weekly counseling, return to Dr. Dewayne Hatch in Feb 2023 Prefers to be called Jill Alexander    Healthcare consumer's goal for treatment:   [name], [facility] will support the patient's ability to achieve the goals identified. Cognitive Behavioral Therapy, Supportive Counseling, Mindfulness and Relaxation Strategies and other evidenced-based practices will be used to promote progress towards healthy functioning.    Healthcare consumer will: Actively participate  in therapy, working towards healthy functioning.     *Justification for Continuation/Discontinuation of Goal: R=Revised, O=Ongoing, A=Achieved, D=Discontinued   Goal 1) Jill Alexander will develop strategies to regulate her emotions and improve her positive self talk Likert rating baseline date 04/12/21: How Easy - 3; How Often - 50% Target Date Goal Was reviewed Status Code Progress towards goal/Likert rating  04/12/22   New Maintained                      Jill Alexander participated in development of tx plan and provided verbal consent.                  Chrissie Noa, PhD               Chrissie Noa, PhD               Chrissie Noa, PhD               Chrissie Noa, PhD               Chrissie Noa, PhD               Chrissie Noa, PhD               Chrissie Noa, PhD               Chrissie Noa, PhD

## 2022-04-03 NOTE — Telephone Encounter (Signed)
I spoke with the patient.  She is scheduled for 05/07/22 at 8 pm.  Mailed packet to the patient.

## 2022-04-09 ENCOUNTER — Ambulatory Visit: Payer: No Typology Code available for payment source | Admitting: Clinical

## 2022-04-10 ENCOUNTER — Ambulatory Visit (INDEPENDENT_AMBULATORY_CARE_PROVIDER_SITE_OTHER): Payer: No Typology Code available for payment source | Admitting: Clinical

## 2022-04-10 DIAGNOSIS — F840 Autistic disorder: Secondary | ICD-10-CM | POA: Diagnosis not present

## 2022-04-10 NOTE — Progress Notes (Signed)
Time: 11:00 am -11:58 am CPT Code: 30865H-84 Diagnosis: F84.0  Jill Alexander was seen in person for therapy. Session began with a risk assessment. She reported passive suicidal ideation without plan or intent, but reported having looked up a rope and a gun on [employer] several months prior. She reported that her parents were already aware of this and that she had not done so in the last month. She was able to list several barriers to self-harm, including fear, indecision, and the impact on her family. She assured the therapist that she will be safe, and is scheduled to be seen again in one week,  [facility] Counselor/Therapist Progress Note   Patient ID: Jill Alexander, MRN: [id number],     Individualized Treatment Plan Strengths: Enjoys reading, Seeking counseling, In training program through [facility], "I like humor and jokes"  Supports: Her dog, her bestfriend since childhood, her parents, her sister    Goal/Needs for Treatment:  In order of importance to patient 1) Emotional Regulation/Anxiety 2) Improve Positive self talk      Client Statement of Needs: "Find ways of being nicer to myself", cope w/ anxiety and worry    Treatment Level:Outpatient Counseling  Symptoms:Anxiety, Perseverative worry, Negative self talk  Client Treatment Preferences:weekly counseling, return to Dr. Dewayne Hatch in Feb 2023 Prefers to be called Jill Alexander    Healthcare consumer's goal for treatment:   [name], [facility] will support the patient's ability to achieve the goals identified. Cognitive Behavioral Therapy, Supportive Counseling, Mindfulness and Relaxation Strategies and other evidenced-based practices will be used to promote progress towards healthy functioning.    Healthcare consumer will: Actively participate in therapy, working towards healthy functioning.     *Justification for Continuation/Discontinuation of Goal: R=Revised, O=Ongoing, A=Achieved, D=Discontinued   Goal 1) Jill Alexander will  develop strategies to regulate her emotions and improve her positive self talk Likert rating baseline date 04/12/21: How Easy - 3; How Often - 50% Target Date Goal Was reviewed Status Code Progress towards goal/Likert rating  04/12/22   New Maintained                      Jill Alexander participated in development of tx plan and provided verbal consent.                  Chrissie Noa, PhD               Chrissie Noa, PhD               Chrissie Noa, PhD               Chrissie Noa, PhD               Chrissie Noa, PhD               Chrissie Noa, PhD                    Chrissie Noa, PhD               Chrissie Noa, PhD

## 2022-04-11 ENCOUNTER — Other Ambulatory Visit (HOSPITAL_COMMUNITY): Payer: Self-pay

## 2022-04-11 ENCOUNTER — Telehealth (HOSPITAL_BASED_OUTPATIENT_CLINIC_OR_DEPARTMENT_OTHER): Payer: No Typology Code available for payment source | Admitting: Psychiatry

## 2022-04-11 DIAGNOSIS — R413 Other amnesia: Secondary | ICD-10-CM | POA: Diagnosis not present

## 2022-04-11 DIAGNOSIS — F411 Generalized anxiety disorder: Secondary | ICD-10-CM | POA: Diagnosis not present

## 2022-04-11 DIAGNOSIS — F332 Major depressive disorder, recurrent severe without psychotic features: Secondary | ICD-10-CM | POA: Diagnosis not present

## 2022-04-11 DIAGNOSIS — F410 Panic disorder [episodic paroxysmal anxiety] without agoraphobia: Secondary | ICD-10-CM | POA: Diagnosis not present

## 2022-04-11 MED ORDER — LORAZEPAM 1 MG PO TABS
1.0000 mg | ORAL_TABLET | Freq: Two times a day (BID) | ORAL | 0 refills | Status: DC | PRN
  Filled 2022-04-11 – 2022-04-23 (×2): qty 60, 30d supply, fill #0

## 2022-04-11 MED ORDER — AUVELITY 45-105 MG PO TBCR
1.0000 | EXTENDED_RELEASE_TABLET | Freq: Two times a day (BID) | ORAL | 0 refills | Status: DC
  Filled 2022-04-11: qty 60, fill #0
  Filled 2022-04-27 – 2022-04-30 (×3): qty 60, 30d supply, fill #0

## 2022-04-11 NOTE — Progress Notes (Signed)
Virtual Visit via Video Note  I connected with Jill Alexander on 04/11/22 at  9:30 AM EST by  a video enabled telemedicine application and verified that I am speaking with the correct person using two identifiers.  Location: Patient: home Provider: office   I discussed the limitations of evaluation and management by telemedicine and the availability of in person appointments. The patient expressed understanding and agreed to proceed.  History of Present Illness: Jill Alexander had a good Thanksgiving with family. She is looking forward to Christmas with more family. Jill Alexander started Auvelity about 2 weeks ago. She was advised to taper off Prozac over 3 weeks but Jill Alexander opted to decrease it and then stopped taking it after decreasing it a little. Jill Alexander is tolerating the Auvelity and denies any noticeable SE. Her therapist recommended Jill Alexander get a workup due to memory issues, periods of confusion and fatigue. It has been going on for a while, even before the med changes. She has not yet scheduled an appointment with neurology. Her depression is unchanged so far. She on/off fleeting SI without plan or intent that are unchanged in the last 2 months. Her anxiety is ongoing and the frequency of panic attacks has decreased.   Jill Alexander's mom was present and asked to speak with me.  Jill Alexander gave verbal permission to talk with her mom. Mom states that they have been worried about Jill Alexander for a while now. They tried to make an appointment with her neurologist but told they need a new referral for a new problem from me or her PCP. Mom explained that Jill Alexander is not remembering conversation after a few hours. Things she was able to do on a normal basis she suddenly can't. She was missing her meds even though Jill Alexander has a good system in place for years now. She is misplacing items. She gets confused easily. Distant memory is fine but her recent memory is very poor. She is more trouble getting her thoughts out and form her sentences.  She is very fatigued and is sleeping more than usual. It all seems more severe and is affecting her quality of life. Mom spoke with her therapist who also shared the same concerns.  She wants Jill Alexander to get a new neuropsych eval. She has not had a seizures in years. Jill Alexander is going to get a sleep study in January. Jill Alexander is taking Auvelity and mom has not noticed any side effects. She works 2 hours a week and notes that she is spacing out while there and at home.    Observations/Objective: Psychiatric Specialty Exam: ROS  There were no vitals taken for this visit.There is no height or weight on file to calculate BMI.  General Appearance: Fairly Groomed and Neat  Eye Contact:  Minimal  Speech:  Clear and Coherent and Slow  Volume:  Normal  Mood:  Anxious and Depressed  Affect:  Blunt  Thought Process:  Coherent and Descriptions of Associations: Circumstantial  Orientation:  Full (Time, Place, and Person)  Thought Content:  Logical  Suicidal Thoughts:  Yes.  without intent/plan  Homicidal Thoughts:  No  Memory:  Immediate;   Fair  Judgement:  Good  Insight:  Good  Psychomotor Activity:  Normal  Concentration:  Concentration: Poor  Recall:  Fair  Fund of Knowledge:  Good  Language:  Good  Akathisia:  No  Handed:  Right  AIMS (if indicated):     Assets:  Communication Skills Desire for Improvement Financial Resources/Insurance Housing Resilience Social Support Curator  Vocational/Educational  ADL's:  Intact  Cognition:  WNL  Sleep:        Assessment and Plan:     03/14/2022   10:14 AM 02/21/2022   10:37 AM 02/12/2022   10:57 AM 02/06/2022   10:48 AM 01/17/2022    3:27 PM  Depression screen PHQ 2/9  Decreased Interest 2 2 3 3    Down, Depressed, Hopeless 3 3 3 3 3   PHQ - 2 Score 5 5 6 6 3   Altered sleeping 1 1 3 3 3   Tired, decreased energy 3 3 3 3 3   Change in appetite 3 3 3 2  0  Feeling bad or failure about yourself  3 3 3 3 3   Trouble  concentrating 3 3 3 3 3   Moving slowly or fidgety/restless 2 2 2 2  0  Suicidal thoughts 3 3 3 3  0  PHQ-9 Score 23 23 26 25 15   Difficult doing work/chores Extremely dIfficult  Extremely dIfficult Very difficult Very difficult    Flowsheet Row Video Visit from [facility]20[facility] Counselor from 02/21/2022 in [facility] Counselor from 02/12/2022 in [facility]  C-SSRS RISK CATEGORY Error: Q7 should not be populated when Q6 is No Error: Question 6 not populated Error: Question 6 not populated        Pt is aware that these meds carry a teratogenic risk. Pt will discuss plan of action if she does or plans to become pregnant in the future.  Status of current problems: worsening memory issues, ongoing depression  Meds: Continue Latuda 40mg  qD 1. Memory changes - Ambulatory referral to Neurology  2. MDD (major depressive disorder), recurrent severe, without psychosis (HCC) - Dextromethorphan-buPROPion ER (AUVELITY) 45-105 MG TBCR; Take 1 tablet by mouth 2 (two) times daily.  Dispense: 60 tablet; Refill: 0  3. GAD (generalized anxiety disorder) - LORazepam (ATIVAN) 1 MG tablet; Take 1 tablet (1 mg total) by mouth 2 (two) times daily as needed for anxiety.  Dispense: 60 tablet; Refill: 0  4. Panic attacks - LORazepam (ATIVAN) 1 MG tablet; Take 1 tablet (1 mg total) by mouth 2 (two) times daily as needed for anxiety.  Dispense: 60 tablet; Refill: 0    Therapy: brief supportive therapy provided. Discussed psychosocial stressors in detail.     Collaboration of Care: Other neurology referral  Patient/Guardian was advised Release of Information must be obtained prior to any record release in order to collaborate their care with an outside provider. Patient/Guardian was advised if they have not already done so to contact the registration department to sign all necessary  forms in order for to release information regarding their care.   Consent: Patient/Guardian gives verbal consent for treatment and assignment of benefits for services provided during this visit. Patient/Guardian expressed understanding and agreed to proceed.     Follow Up Instructions: Follow up in 1 month or sooner if needed    I discussed the assessment and treatment plan with the patient. The patient was provided an opportunity to ask questions and all were answered. The patient agreed with the plan and demonstrated an understanding of the instructions.   The patient was advised to call back or seek an in-person evaluation if the symptoms worsen or if the condition fails to improve as anticipated.  I provided 29 minutes of non-face-to-face time during this encounter.   , MD

## 2022-04-15 NOTE — Telephone Encounter (Signed)
I spoke with the patient and r/s her SS appointment for Sunday 05/05/21 at 9 pm due to scheduling issues.  Mailed new packet to the patient.

## 2022-04-15 NOTE — Psych (Signed)
Virtual Visit via Video Note  I connected with Jill Alexander on 02/11/22 at  9:00 AM EDT by a video enabled telemedicine application and verified that I am speaking with the correct person using two identifiers.  Location: Patient: patient home Provider: clinical home office   I discussed the limitations of evaluation and management by telemedicine and the availability of in person appointments. The patient expressed understanding and agreed to proceed.  I discussed the assessment and treatment plan with the patient. The patient was provided an opportunity to ask questions and all were answered. The patient agreed with the plan and demonstrated an understanding of the instructions.   The patient was advised to call back or seek an in-person evaluation if the symptoms worsen or if the condition fails to improve as anticipated.  Pt was provided 240 minutes of non-face-to-face time during this encounter.   Donia Guiles, LCSW   [facility] PHP THERAPIST PROGRESS NOTE  Jill Alexander [id number]  Session Time: 9:00 - 10:00  Participation Level: Active  Behavioral Response: CasualAlertDepressed  Type of Therapy: Group Therapy  Treatment Goals addressed: Coping  Progress Towards Goals: Initial  Interventions: CBT, DBT, Supportive, and Reframing  Summary: Jill Alexander is a 26 y.o. female who presents with depression symptoms.  Clinician led check-in regarding current stressors and situation, and review of patient completed daily inventory. Clinician utilized active listening and empathetic response and validated patient emotions. Clinician facilitated processing group on pertinent issues.?   Summary: Patient arrived within time allowed. Patient rates her mood at a 1.5 on a scale of 1-10 with 10 being best. Pt her weekend was "very overwhelming" and she attended her brother's wedding. Pt reports feeling bad about herself when there is a happy event due to comparison.  Pt states she slept badly and her appetite is low. Pt identifies passive SI and denies plan or intent. Pt able to process.?Pt engaged in discussion.?       Session Time: 10:00 am - 11:00 am  Participation Level: Active  Behavioral Response: CasualAlertAnxious and Depressed  Type of Therapy: Group Therapy  Treatment Goals addressed: Coping  Progress Towards Goals: Progressing  Interventions: CBT, DBT, Solution Focused, Strength-based, Supportive, and Reframing  Therapist Response: Cln led processing group for pt's current struggles. Group members shared stressors and provided support and feedback. Cln brought in topics of boundaries, healthy relationships, and unhealthy thought processes to inform discussion.   Summary: Pt engaged in discussion and provides support to group.         Session Time: 11:00 am - 12:00 pm   Participation Level: Active   Behavioral Response: CasualAlertAnxious and Depressed   Type of Therapy: Group Therapy   Treatment Goals addressed: Coping   Progress Towards Goals: Progressing   Interventions: CBT, DBT, Solution Focused, Strength-based, Supportive, and Reframing   Therapist Response: Cln introduced DBT interpersonal effectiveness skill for self-respect, FAST. Cln led discussion on barriers to utilizing this skill, how it could be helpful, and situations in which they could have utilized it.      Summary: Pt engaged in discussion and reports willingness to utilize FAST.      Session Time: 12:00 pm - 1:00 pm  Participation Level: Active  Behavioral Response: CasualAlertAnxious and Depressed  Type of Therapy: Group Therapy  Treatment Goals addressed: Coping  Progress Towards Goals: Progressing  Interventions: CBT, DBT, Solution Focused, Strength-based, Supportive, and Reframing  Therapist Response: 12:00 - 12:50 pm: See OT note.  12:50 - 1:00 pm:  Clinician led check-out. Clinician assessed for immediate needs, medication  compliance and efficacy, and safety concerns?  Summary: 12:00 - 12:50 pm: See OT note   12:50 - 1:00 pm: At check-out, patient contracts for safety.?Patient demonstrates progress as evidenced by participation in first group session. Patient denies SI/HI/self-harm thoughts at the end of group and agrees to seek help should those thoughts/feelings occur.?    Suicidal/Homicidal: Yeswithout intent/plan  Plan: Pt will continue in PHP while working to decrease depression and anxiety symptoms, increase ADLS, and increase ability to manage symptoms in a healthy manner.   Collaboration of Care: Medication Management AEB V [name]  Patient/Guardian was advised Release of Information must be obtained prior to any record release in order to collaborate their care with an outside provider. Patient/Guardian was advised if they have not already done so to contact the registration department to sign all necessary forms in order for [place] to release information regarding their care.   Consent: Patient/Guardian gives verbal consent for treatment and assignment of benefits for services provided during this visit. Patient/Guardian expressed understanding and agreed to proceed.   Diagnosis: MDD (major depressive disorder), recurrent severe, without psychosis (HCC) [F33.2]    1. MDD (major depressive disorder), recurrent severe, without psychosis (HCC)   2. Panic attacks   3. Autism spectrum disorder   4. GAD (generalized anxiety disorder)      Donia Guiles, LCSW

## 2022-04-15 NOTE — Psych (Signed)
Virtual Visit via Video Note  I connected with Jill Alexander on 02/12/22 at  9:00 AM EDT by a video enabled telemedicine application and verified that I am speaking with the correct person using two identifiers.  Location: Patient: patient home Provider: clinical home office   I discussed the limitations of evaluation and management by telemedicine and the availability of in person appointments. The patient expressed understanding and agreed to proceed.  I discussed the assessment and treatment plan with the patient. The patient was provided an opportunity to ask questions and all were answered. The patient agreed with the plan and demonstrated an understanding of the instructions.   The patient was advised to call back or seek an in-person evaluation if the symptoms worsen or if the condition fails to improve as anticipated.  Pt was provided 240 minutes of non-face-to-face time during this encounter.   Donia Guiles, LCSW   [facility] PHP THERAPIST PROGRESS NOTE  Jill Alexander [id number]  Session Time: 9:00 - 10:00  Participation Level: Active  Behavioral Response: CasualAlertDepressed  Type of Therapy: Group Therapy  Treatment Goals addressed: Coping  Progress Towards Goals: Initial  Interventions: CBT, DBT, Supportive, and Reframing  Summary: Jill Alexander is a 26 y.o. female who presents with depression symptoms.  Clinician led check-in regarding current stressors and situation, and review of patient completed daily inventory. Clinician utilized active listening and empathetic response and validated patient emotions. Clinician facilitated processing group on pertinent issues.?   Summary: Patient arrived within time allowed. Patient rates her mood at a 2 on a scale of 1-10 with 10 being best. Pt reports her sleep was "okay" and she ate 2x. Pt identifies passive SI and denies plan or intent. Pt able to process.?Pt engaged in discussion.?       Session  Time: 10:00 am - 11:00 am  Participation Level: Active  Behavioral Response: CasualAlertAnxious and Depressed  Type of Therapy: Group Therapy  Treatment Goals addressed: Coping  Progress Towards Goals: Progressing  Interventions: CBT, DBT, Solution Focused, Strength-based, Supportive, and Reframing  Therapist Response: Cln led discussion on positive self-esteem. Group shared struggles they experience with self-esteem. Group highlights difficulty in accepting compliments. Cln encouraged pt's to use checking the facts as a way to challenge the negative thinking which accompanies receiving compliments. Cln suggested pt's consider making a compliment file in which they document positive things people say to/about them to review when they are feeling poorly about themselves.  Summary:  Pt engaged in conversation and shares difficulty in accepting compliments. Pt is able to process and reports being open to starting a compliment file.          Session Time: 11:00 am - 12:00 pm   Participation Level: Active   Behavioral Response: CasualAlertAnxious and Depressed   Type of Therapy: Group Therapy   Treatment Goals addressed: Coping   Progress Towards Goals: Progressing   Interventions: CBT, DBT, Solution Focused, Strength-based, Supportive, and Reframing   Therapist Response: Cln led discussion on planning ahead as a way to mitigate anxiety. Group members shared worries about keeping up good habits when returning to "normal" life post-treatment. Group able to brainstorm ways to build habits now and how they can fit into their life after treatment.   Summary: Pt engaged in discussion and identified ways to plan ahead for anxieties.      Session Time: 12:00 pm - 1:00 pm  Participation Level: Active  Behavioral Response: CasualAlertAnxious and Depressed  Type of Therapy: Group  Therapy  Treatment Goals addressed: Coping  Progress Towards Goals: Progressing  Interventions:  CBT, DBT, Solution Focused, Strength-based, Supportive, and Reframing  Therapist Response: 12:00 - 12:50 pm: See OT note.  12:50 - 1:00 pm: Clinician led check-out. Clinician assessed for immediate needs, medication compliance and efficacy, and safety concerns?  Summary: 12:00 - 12:50 pm: See OT note   12:50 - 1:00 pm: At check-out, patient contracts for safety.?Patient demonstrates progress as evidenced by continued engagement in group and responsiveness to treatment. Patient denies SI/HI/self-harm thoughts at the end of group and agrees to seek help should those thoughts/feelings occur.?    Suicidal/Homicidal: Yeswithout intent/plan  Plan: Pt will continue in PHP while working to decrease depression and anxiety symptoms, increase ADLS, and increase ability to manage symptoms in a healthy manner.   Collaboration of Care: Medication Management AEB V [name]  Patient/Guardian was advised Release of Information must be obtained prior to any record release in order to collaborate their care with an outside provider. Patient/Guardian was advised if they have not already done so to contact the registration department to sign all necessary forms in order for [place] to release information regarding their care.   Consent: Patient/Guardian gives verbal consent for treatment and assignment of benefits for services provided during this visit. Patient/Guardian expressed understanding and agreed to proceed.   Diagnosis: MDD (major depressive disorder), recurrent severe, without psychosis (HCC) [F33.2]    1. MDD (major depressive disorder), recurrent severe, without psychosis (HCC)   2. GAD (generalized anxiety disorder)   3. Autism spectrum disorder      Donia Guiles, LCSW

## 2022-04-16 ENCOUNTER — Ambulatory Visit: Payer: No Typology Code available for payment source | Admitting: Clinical

## 2022-04-16 NOTE — Psych (Signed)
Virtual Visit via Video Note  I connected with Jill Alexander on 02/21/22 at  9:00 AM EDT by a video enabled telemedicine application and verified that I am speaking with the correct person using two identifiers.  Location: Patient: patient home Provider: clinical home office   I discussed the limitations of evaluation and management by telemedicine and the availability of in person appointments. The patient expressed understanding and agreed to proceed.  I discussed the assessment and treatment plan with the patient. The patient was provided an opportunity to ask questions and all were answered. The patient agreed with the plan and demonstrated an understanding of the instructions.   The patient was advised to call back or seek an in-person evaluation if the symptoms worsen or if the condition fails to improve as anticipated.  Pt was provided 240 minutes of non-face-to-face time during this encounter.   Donia Guiles, LCSW   [facility] PHP THERAPIST PROGRESS NOTE  Jill Alexander [id number]  Session Time: 9:00 - 10:00  Participation Level: Active  Behavioral Response: CasualAlertDepressed  Type of Therapy: Group Therapy  Treatment Goals addressed: Coping  Progress Towards Goals: Progressing  Interventions: CBT, DBT, Supportive, and Reframing  Summary: Jill Alexander is a 26 y.o. female who presents with depression symptoms.  Clinician led check-in regarding current stressors and situation, and review of patient completed daily inventory. Clinician utilized active listening and empathetic response and validated patient emotions. Clinician facilitated processing group on pertinent issues.?   Summary: Patient arrived within time allowed. Patient rates her mood at a 4 on a scale of 1-10 with 10 being best. Pt reports she slept 9.5 hours and she ate 1x. Pt identifies passive SI and denies plan or intent. Pt presents with paucity and struggles with fixation. Pt able  to process.?Pt engaged in discussion.?       Session Time: 10:00 am - 11:00 am  Participation Level: Active  Behavioral Response: CasualAlertAnxious and Depressed  Type of Therapy: Group Therapy  Treatment Goals addressed: Coping  Progress Towards Goals: Progressing  Interventions: CBT, DBT, Solution Focused, Strength-based, Supportive, and Reframing  Therapist Response: Cln introduced CBT and the way in which it can provide context for addressing stumbling blocks. Group discussed "the problem is not the problem, the problem is how we're thinking about the problem" and tried to change perspective on current struggles.  Summary:  Pt engaged in discussion and is able to attempt reframing using CBT.        Session Time: 11:00 am - 12:00 pm   Participation Level: Active   Behavioral Response: CasualAlertAnxious and Depressed   Type of Therapy: Group Therapy   Treatment Goals addressed: Coping   Progress Towards Goals: Progressing   Interventions: CBT, DBT, Solution Focused, Strength-based, Supportive, and Reframing   Therapist Response: Cln continued topic of CBT cognitive distortions and utilized handout "Unhealthy Thought Patterns "to review common examples of distorted thought to increase awareness of the distorted thoughts.  Summary: Pt engaged in discussion and is able to make connections.        Session Time: 12:00 pm - 1:00 pm  Participation Level: Active  Behavioral Response: CasualAlertAnxious and Depressed  Type of Therapy: Group Therapy  Treatment Goals addressed: Coping  Progress Towards Goals: Progressing  Interventions: CBT, DBT, Solution Focused, Strength-based, Supportive, and Reframing  Therapist Response: 12:00 - 12:50 pm: See OT note.  12:50 - 1:00 pm: Clinician led check-out. Clinician assessed for immediate needs, medication compliance and efficacy, and safety concerns?  Summary: 12:00 - 12:50 pm: See OT note   12:50 - 1:00 pm: At  check-out, patient contracts for safety.?Patient demonstrates progress as evidenced by continued engagement in group and responsiveness to treatment. Patient denies SI/HI/self-harm thoughts at the end of group and agrees to seek help should those thoughts/feelings occur.?    Suicidal/Homicidal: Yeswithout intent/plan  Plan: Pt will continue in PHP while working to decrease depression and anxiety symptoms, increase ADLS, and increase ability to manage symptoms in a healthy manner.   Collaboration of Care: Medication Management AEB V [name]  Patient/Guardian was advised Release of Information must be obtained prior to any record release in order to collaborate their care with an outside provider. Patient/Guardian was advised if they have not already done so to contact the registration department to sign all necessary forms in order for [place] to release information regarding their care.   Consent: Patient/Guardian gives verbal consent for treatment and assignment of benefits for services provided during this visit. Patient/Guardian expressed understanding and agreed to proceed.   Diagnosis: MDD (major depressive disorder), recurrent severe, without psychosis (HCC) [F33.2]    1. MDD (major depressive disorder), recurrent severe, without psychosis (HCC)   2. Autism spectrum disorder   3. GAD (generalized anxiety disorder)      Donia Guiles, LCSW

## 2022-04-16 NOTE — Psych (Signed)
Virtual Visit via Video Note  I connected with Jill Alexander on 02/18/22 at  9:00 AM EDT by a video enabled telemedicine application and verified that I am speaking with the correct person using two identifiers.  Location: Patient: patient home Provider: clinical home office   I discussed the limitations of evaluation and management by telemedicine and the availability of in person appointments. The patient expressed understanding and agreed to proceed.  I discussed the assessment and treatment plan with the patient. The patient was provided an opportunity to ask questions and all were answered. The patient agreed with the plan and demonstrated an understanding of the instructions.   The patient was advised to call back or seek an in-person evaluation if the symptoms worsen or if the condition fails to improve as anticipated.  Pt was provided 240 minutes of non-face-to-face time during this encounter.   Jill Guiles, LCSW   [facility] PHP THERAPIST PROGRESS NOTE  Jill Alexander [id number]  Session Time: 9:00 - 10:00  Participation Level: Active  Behavioral Response: CasualAlertDepressed  Type of Therapy: Group Therapy  Treatment Goals addressed: Coping  Progress Towards Goals: Progressing  Interventions: CBT, DBT, Supportive, and Reframing  Summary: Jill Alexander is a 26 y.o. female who presents with depression symptoms.  Clinician led check-in regarding current stressors and situation, and review of patient completed daily inventory. Clinician utilized active listening and empathetic response and validated patient emotions. Clinician facilitated processing group on pertinent issues.?   Summary: Patient arrived within time allowed. Patient rates her mood at a 2.5 on a scale of 1-10 with 10 being best. Pt reports she slept approx 10 hours and she ate 2x. Pt identifies passive SI and denies plan or intent. Pt presents with paucity and struggles with fixation.  Pt able to process.?Pt engaged in discussion.?       Session Time: 10:00 am - 11:00 am  Participation Level: Active  Behavioral Response: CasualAlertAnxious and Depressed  Type of Therapy: Group Therapy  Treatment Goals addressed: Coping  Progress Towards Goals: Progressing  Interventions: CBT, DBT, Solution Focused, Strength-based, Supportive, and Reframing  Therapist Response: Cln led processing group for pt's current struggles. Group members shared stressors and provided support and feedback. Cln brought in topics of boundaries, healthy relationships, and unhealthy thought processes to inform discussion.   Summary:  Pt able to process and provide support to group.        Session Time: 11:00 am - 12:00 pm   Participation Level: Active   Behavioral Response: CasualAlertAnxious and Depressed   Type of Therapy: Group Therapy   Treatment Goals addressed: Coping   Progress Towards Goals: Progressing   Interventions: CBT, DBT, Solution Focused, Strength-based, Supportive, and Reframing   Therapist Response: Cln continued topic of boundaries and introduced how to set and maintain healthy boundaries. Cln utilized handout "how to set boundaries" and group members worked through examples to practice setting appropriate boundaries.   Summary: Pt engaged in discussion and reports understanding.     Session Time: 12:00 pm - 1:00 pm  Participation Level: Active  Behavioral Response: CasualAlertAnxious and Depressed  Type of Therapy: Group Therapy  Treatment Goals addressed: Coping  Progress Towards Goals: Progressing  Interventions: CBT, DBT, Solution Focused, Strength-based, Supportive, and Reframing  Therapist Response: 12:00 - 12:50 pm: See OT note.  12:50 - 1:00 pm: Clinician led check-out. Clinician assessed for immediate needs, medication compliance and efficacy, and safety concerns?  Summary: 12:00 - 12:50 pm: See OT note  12:50 - 1:00 pm: At check-out,  patient contracts for safety.?Patient demonstrates progress as evidenced by continued engagement in group and responsiveness to treatment. Patient denies SI/HI/self-harm thoughts at the end of group and agrees to seek help should those thoughts/feelings occur.?    Suicidal/Homicidal: Yeswithout intent/plan  Plan: Pt will continue in PHP while working to decrease depression and anxiety symptoms, increase ADLS, and increase ability to manage symptoms in a healthy manner.   Collaboration of Care: Medication Management AEB V [name]  Patient/Guardian was advised Release of Information must be obtained prior to any record release in order to collaborate their care with an outside provider. Patient/Guardian was advised if they have not already done so to contact the registration department to sign all necessary forms in order for [place] to release information regarding their care.   Consent: Patient/Guardian gives verbal consent for treatment and assignment of benefits for services provided during this visit. Patient/Guardian expressed understanding and agreed to proceed.   Diagnosis: MDD (major depressive disorder), recurrent severe, without psychosis (HCC) [F33.2]    1. MDD (major depressive disorder), recurrent severe, without psychosis (HCC)   2. Autism spectrum disorder   3. GAD (generalized anxiety disorder)      Jill Guiles, LCSW

## 2022-04-16 NOTE — Psych (Signed)
Virtual Visit via Video Note  I connected with Jill Alexander on 02/19/22 at  9:00 AM EDT by a video enabled telemedicine application and verified that I am speaking with the correct person using two identifiers.  Location: Patient: patient home Provider: clinical home office   I discussed the limitations of evaluation and management by telemedicine and the availability of in person appointments. The patient expressed understanding and agreed to proceed.  I discussed the assessment and treatment plan with the patient. The patient was provided an opportunity to ask questions and all were answered. The patient agreed with the plan and demonstrated an understanding of the instructions.   The patient was advised to call back or seek an in-person evaluation if the symptoms worsen or if the condition fails to improve as anticipated.  Pt was provided 240 minutes of non-face-to-face time during this encounter.   Donia Guiles, LCSW   [facility] PHP THERAPIST PROGRESS NOTE  Jill Alexander [id number]  Session Time: 9:00 - 10:00  Participation Level: Active  Behavioral Response: CasualAlertDepressed  Type of Therapy: Group Therapy  Treatment Goals addressed: Coping  Progress Towards Goals: Progressing  Interventions: CBT, DBT, Supportive, and Reframing  Summary: Jill Alexander is a 26 y.o. female who presents with depression symptoms.  Clinician led check-in regarding current stressors and situation, and review of patient completed daily inventory. Clinician utilized active listening and empathetic response and validated patient emotions. Clinician facilitated processing group on pertinent issues.?   Summary: Patient arrived within time allowed. Patient rates her mood at a 1.5 on a scale of 1-10 with 10 being best. Pt reports she slept "fine" and she ate 3x. Pt identifies passive SI and denies plan or intent. Pt presents with paucity and struggles with fixation. Pt  reports high rumination. Pt able to process.?Pt engaged in discussion.?       Session Time: 10:00 am - 11:00 am  Participation Level: Active  Behavioral Response: CasualAlertAnxious and Depressed  Type of Therapy: Group Therapy  Treatment Goals addressed: Coping  Progress Towards Goals: Progressing  Interventions: CBT, DBT, Solution Focused, Strength-based, Supportive, and Reframing  Therapist Response: Cln continued discussion on topic of boundaries. Group reviewed previous aspects of boundaries discussed. Cln utilized handout "Tips for [employer]" and group members discussed how to apply the tips. Cln shaped conversation and cued for healthy boundary characteristics.   Summary:  Pt engaged in discussion and is able to process ways to increase their healthy boundaries.         Session Time: 11:00 am - 12:00 pm   Participation Level: Active   Behavioral Response: CasualAlertAnxious and Depressed   Type of Therapy: Group Therapy   Treatment Goals addressed: Coping   Progress Towards Goals: Progressing   Interventions: CBT, DBT, Solution Focused, Strength-based, Supportive, and Reframing   Therapist Response: Cln continued topic of boundaries and led a "boundary workshop" in which group members brought current boundary issues and group worked together to apply boundary concepts to help address the concern. Cln helped shape conversation to maintain fidelity.   Summary: Pt engaged in discussion and shared a current boundary issue and reports gaining insight.     Session Time: 12:00 pm - 1:00 pm  Participation Level: Active  Behavioral Response: CasualAlertAnxious and Depressed  Type of Therapy: Group Therapy  Treatment Goals addressed: Coping  Progress Towards Goals: Progressing  Interventions: CBT, DBT, Solution Focused, Strength-based, Supportive, and Reframing  Therapist Response: 12:00 - 12:50 pm: See OT note.  12:50 - 1:00 pm: Clinician led  check-out. Clinician assessed for immediate needs, medication compliance and efficacy, and safety concerns?  Summary: 12:00 - 12:50 pm: See OT note   12:50 - 1:00 pm: At check-out, patient contracts for safety.?Patient demonstrates progress as evidenced by continued engagement in group and responsiveness to treatment. Patient denies SI/HI/self-harm thoughts at the end of group and agrees to seek help should those thoughts/feelings occur.?    Suicidal/Homicidal: Yeswithout intent/plan  Plan: Pt will continue in PHP while working to decrease depression and anxiety symptoms, increase ADLS, and increase ability to manage symptoms in a healthy manner.   Collaboration of Care: Medication Management AEB V [name]  Patient/Guardian was advised Release of Information must be obtained prior to any record release in order to collaborate their care with an outside provider. Patient/Guardian was advised if they have not already done so to contact the registration department to sign all necessary forms in order for [place] to release information regarding their care.   Consent: Patient/Guardian gives verbal consent for treatment and assignment of benefits for services provided during this visit. Patient/Guardian expressed understanding and agreed to proceed.   Diagnosis: MDD (major depressive disorder), recurrent severe, without psychosis (HCC) [F33.2]    1. MDD (major depressive disorder), recurrent severe, without psychosis (HCC)   2. Autism spectrum disorder   3. GAD (generalized anxiety disorder)      Donia Guiles, LCSW

## 2022-04-16 NOTE — Psych (Signed)
Virtual Visit via Video Note  I connected with Jill Alexander on 02/22/22 at  9:00 AM EDT by a video enabled telemedicine application and verified that I am speaking with the correct person using two identifiers.  Location: Patient: patient home Provider: clinical home office   I discussed the limitations of evaluation and management by telemedicine and the availability of in person appointments. The patient expressed understanding and agreed to proceed.  I discussed the assessment and treatment plan with the patient. The patient was provided an opportunity to ask questions and all were answered. The patient agreed with the plan and demonstrated an understanding of the instructions.   The patient was advised to call back or seek an in-person evaluation if the symptoms worsen or if the condition fails to improve as anticipated.  Pt was provided 240 minutes of non-face-to-face time during this encounter.   Jill Guiles, LCSW   [facility] PHP THERAPIST PROGRESS NOTE  Jill Alexander [id number]  Session Time: 9:00 - 10:00  Participation Level: Active  Behavioral Response: CasualAlertDepressed  Type of Therapy: Group Therapy  Treatment Goals addressed: Coping  Progress Towards Goals: Progressing  Interventions: CBT, DBT, Supportive, and Reframing  Summary: Jill Alexander is a 26 y.o. female who presents with depression symptoms.  Clinician led check-in regarding current stressors and situation, and review of patient completed daily inventory. Clinician utilized active listening and empathetic response and validated patient emotions. Clinician facilitated processing group on pertinent issues.?   Summary: Patient arrived within time allowed. Patient rates her mood at a 3 on a scale of 1-10 with 10 being best. Pt reports she slept 9 hours and she ate 2x. Pt identifies passive SI and denies plan or intent. Pt presents with paucity and struggles with fixation. Pt able to  process.?Pt engaged in discussion.?       Session Time: 10:00 am - 11:00 am  Participation Level: Active  Behavioral Response: CasualAlertAnxious and Depressed  Type of Therapy: Group Therapy  Treatment Goals addressed: Coping  Progress Towards Goals: Progressing  Interventions: CBT, DBT, Solution Focused, Strength-based, Supportive, and Reframing  Therapist Response: Cln continued topic of CBT cognitive distortions and introduced thought challenging as a way to  utilize the "challenge" C in C-C-C. Group utilized Administrator, Civil [employer] questions" as a way to introduce challenges and reframe distorted thinking. Group members worked through pt examples to practice challenging distorted thinking.    Summary: Pt engaged in discussion and demonstrates understanding of challenging distorted thoughts through practice.        Session Time: 11:00 am - 12:00 pm   Participation Level: Active   Behavioral Response: CasualAlertDepressed   Type of Therapy: Group Therapy   Treatment Goals addressed: Coping   Progress Towards Goals: Progressing   Interventions: CBT, DBT, Solution Focused, Strength-based, Supportive, and Reframing   Therapist Response: Cln led discussion on ways to manage stressors and feelings over the weekend. Group members  brainstormed things to do over the weekend for multiple levels of energy, access, and moods. Cln reviewed crisis services should they be needed and provided pt's with the text crisis line, mobile crisis, national suicide hotline, [facility] 24/7 line, and information on [facility].      Summary: Pt engaged in discussion and is able to identify 3 ideas of what to do over the weekend to keep their mind engaged.         Session Time: 12:00 pm - 1:00 pm  Participation Level: Active  Behavioral Response:  CasualAlertAnxious and Depressed  Type of Therapy: Group Therapy  Treatment Goals addressed: Coping  Progress Towards Goals:  Progressing  Interventions: CBT, DBT, Solution Focused, Strength-based, Supportive, and Reframing  Therapist Response: 12:00 - 12:50 pm: See OT note.  12:50 - 1:00 pm: Clinician led check-out. Clinician assessed for immediate needs, medication compliance and efficacy, and safety concerns?  Summary: 12:00 - 12:50 pm: See OT note   12:50 - 1:00 pm: At check-out, patient contracts for safety.?Patient demonstrates progress as evidenced by continued engagement in group and responsiveness to treatment. Patient denies SI/HI/self-harm thoughts at the end of group and agrees to seek help should those thoughts/feelings occur.?    Suicidal/Homicidal: Nowithout intent/plan  Plan: Pt will discharge from PHP due to meeting treatment goals of decreased depression and anxiety symptoms, increased ADLS, and increased ability to manage symptoms in a healthy manner. Pt will return to previous providers for ongoing care. Pt and provider are aligned with discharge. Pt denies SI/HI at time of discharge.   Collaboration of Care: Medication Management AEB V [name]  Patient/Guardian was advised Release of Information must be obtained prior to any record release in order to collaborate their care with an outside provider. Patient/Guardian was advised if they have not already done so to contact the registration department to sign all necessary forms in order for [place] to release information regarding their care.   Consent: Patient/Guardian gives verbal consent for treatment and assignment of benefits for services provided during this visit. Patient/Guardian expressed understanding and agreed to proceed.   Diagnosis: MDD (major depressive disorder), recurrent severe, without psychosis (HCC) [F33.2]    1. MDD (major depressive disorder), recurrent severe, without psychosis (HCC)   2. GAD (generalized anxiety disorder)   3. Autism spectrum disorder      Jill Guiles, LCSW

## 2022-04-16 NOTE — Psych (Signed)
Virtual Visit via Video Note  I connected with Jill Alexander on 02/13/22 at  9:00 AM EDT by a video enabled telemedicine application and verified that I am speaking with the correct person using two identifiers.  Location: Patient: patient home Provider: clinical home office   I discussed the limitations of evaluation and management by telemedicine and the availability of in person appointments. The patient expressed understanding and agreed to proceed.  I discussed the assessment and treatment plan with the patient. The patient was provided an opportunity to ask questions and all were answered. The patient agreed with the plan and demonstrated an understanding of the instructions.   The patient was advised to call back or seek an in-person evaluation if the symptoms worsen or if the condition fails to improve as anticipated.  Pt was provided 240 minutes of non-face-to-face time during this encounter.   Jill Guiles, LCSW   [facility] PHP THERAPIST PROGRESS NOTE  Jill Alexander [id number]  Session Time: 9:00 - 10:00  Participation Level: Active  Behavioral Response: CasualAlertDepressed  Type of Therapy: Group Therapy  Treatment Goals addressed: Coping  Progress Towards Goals: Initial  Interventions: CBT, DBT, Supportive, and Reframing  Summary: Jill Alexander is a 26 y.o. female who presents with depression symptoms.  Clinician led check-in regarding current stressors and situation, and review of patient completed daily inventory. Clinician utilized active listening and empathetic response and validated patient emotions. Clinician facilitated processing group on pertinent issues.?   Summary: Patient arrived within time allowed. Patient rates her mood at a 1.5 on a scale of 1-10 with 10 being best. Pt reports she slept 9 hours and she ate 2x. Pt identifies passive SI and denies plan or intent. Pt presents with paucity. Pt able to process.?Pt engaged in  discussion.?       Session Time: 10:00 am - 11:00 am  Participation Level: Active  Behavioral Response: CasualAlertAnxious and Depressed  Type of Therapy: Group Therapy  Treatment Goals addressed: Coping  Progress Towards Goals: Progressing  Interventions: CBT, DBT, Solution Focused, Strength-based, Supportive, and Reframing  Therapist Response: Cln led processing group for pt's current struggles. Group members shared stressors and provided support and feedback. Cln brought in topics of boundaries, healthy relationships, and unhealthy thought processes to inform discussion.   Summary:  Pt able to process and provide support to group.      Session Time: 11:00 am - 12:00 pm   Participation Level: Active   Behavioral Response: CasualAlertAnxious and Depressed   Type of Therapy: Group Therapy, Spiritual Care   Treatment Goals addressed: Coping   Progress Towards Goals: Progressing   Interventions: Supportive, Education   Therapist Response: Laurell Josephs, Chaplain, led group.   Summary: Pt engaged in discussion.    Session Time: 12:00 pm - 1:00 pm  Participation Level: Active  Behavioral Response: CasualAlertAnxious and Depressed  Type of Therapy: Group Therapy  Treatment Goals addressed: Coping  Progress Towards Goals: Progressing  Interventions: CBT, DBT, Solution Focused, Strength-based, Supportive, and Reframing  Therapist Response: 12:00 - 12:50 pm: See OT note.  12:50 - 1:00 pm: Clinician led check-out. Clinician assessed for immediate needs, medication compliance and efficacy, and safety concerns?  Summary: 12:00 - 12:50 pm: See OT note   12:50 - 1:00 pm: At check-out, patient contracts for safety.?Patient demonstrates progress as evidenced by continued engagement in group and responsiveness to treatment. Patient denies SI/HI/self-harm thoughts at the end of group and agrees to seek help should those thoughts/feelings occur.?  Suicidal/Homicidal:  Yeswithout intent/plan  Plan: Pt will continue in PHP while working to decrease depression and anxiety symptoms, increase ADLS, and increase ability to manage symptoms in a healthy manner.   Collaboration of Care: Medication Management AEB V [name]  Patient/Guardian was advised Release of Information must be obtained prior to any record release in order to collaborate their care with an outside provider. Patient/Guardian was advised if they have not already done so to contact the registration department to sign all necessary forms in order for [place] to release information regarding their care.   Consent: Patient/Guardian gives verbal consent for treatment and assignment of benefits for services provided during this visit. Patient/Guardian expressed understanding and agreed to proceed.   Diagnosis: MDD (major depressive disorder), recurrent severe, without psychosis (HCC) [F33.2]    1. MDD (major depressive disorder), recurrent severe, without psychosis (HCC)   2. GAD (generalized anxiety disorder)   3. Autism spectrum disorder      Jill Guiles, LCSW

## 2022-04-16 NOTE — Psych (Signed)
Virtual Visit via Video Note  I connected with Jill Alexander on 02/20/22 at  9:00 AM EDT by a video enabled telemedicine application and verified that I am speaking with the correct person using two identifiers.  Location: Patient: patient home Provider: clinical home office   I discussed the limitations of evaluation and management by telemedicine and the availability of in person appointments. The patient expressed understanding and agreed to proceed.  I discussed the assessment and treatment plan with the patient. The patient was provided an opportunity to ask questions and all were answered. The patient agreed with the plan and demonstrated an understanding of the instructions.   The patient was advised to call back or seek an in-person evaluation if the symptoms worsen or if the condition fails to improve as anticipated.  Pt was provided 240 minutes of non-face-to-face time during this encounter.   Jill Guiles, LCSW   [facility] PHP THERAPIST PROGRESS NOTE  Jill Alexander [id number]  Session Time: 9:00 - 10:00  Participation Level: Active  Behavioral Response: CasualAlertDepressed  Type of Therapy: Group Therapy  Treatment Goals addressed: Coping  Progress Towards Goals: Progressing  Interventions: CBT, DBT, Supportive, and Reframing  Summary: Jill Alexander is a 26 y.o. female who presents with depression symptoms.  Clinician led check-in regarding current stressors and situation, and review of patient completed daily inventory. Clinician utilized active listening and empathetic response and validated patient emotions. Clinician facilitated processing group on pertinent issues.?   Summary: Patient arrived within time allowed. Patient rates her mood at a 1.5 on a scale of 1-10 with 10 being best. Pt reports she slept "okay" and she ate 3x. Pt identifies passive SI and denies plan or intent. Pt presents with paucity and struggles with fixation. Pt  reports high rumination. Pt able to process.?Pt engaged in discussion.?       Session Time: 10:00 am - 11:00 am  Participation Level: Active  Behavioral Response: CasualAlertAnxious and Depressed  Type of Therapy: Group Therapy  Treatment Goals addressed: Coping  Progress Towards Goals: Progressing  Interventions: CBT, DBT, Solution Focused, Strength-based, Supportive, and Reframing  Therapist Response: Cln led processing group for pt's current struggles. Group members shared stressors and provided support and feedback. Cln brought in topics of boundaries, healthy relationships, and unhealthy thought processes to inform discussion.   Summary:  Pt able to process and provide support to group.      Session Time: 11:00 am - 12:00 pm   Participation Level: Active   Behavioral Response: CasualAlertAnxious and Depressed   Type of Therapy: Group Therapy, Spiritual Care   Treatment Goals addressed: Coping   Progress Towards Goals: Progressing   Interventions: Supportive, Education   Therapist Response: Laurell Josephs, Chaplain, led group.   Summary: Pt engaged in discussion.    Session Time: 12:00 pm - 1:00 pm  Participation Level: Active  Behavioral Response: CasualAlertAnxious and Depressed  Type of Therapy: Group Therapy  Treatment Goals addressed: Coping  Progress Towards Goals: Progressing  Interventions: CBT, DBT, Solution Focused, Strength-based, Supportive, and Reframing  Therapist Response: 12:00 - 12:50 pm: See OT note.  12:50 - 1:00 pm: Clinician led check-out. Clinician assessed for immediate needs, medication compliance and efficacy, and safety concerns?  Summary: 12:00 - 12:50 pm: See OT note   12:50 - 1:00 pm: At check-out, patient contracts for safety.?Patient demonstrates progress as evidenced by continued engagement in group and responsiveness to treatment. Patient denies SI/HI/self-harm thoughts at the end of group and agrees  to seek help should  those thoughts/feelings occur.?    Suicidal/Homicidal: Yeswithout intent/plan  Plan: Pt will continue in PHP while working to decrease depression and anxiety symptoms, increase ADLS, and increase ability to manage symptoms in a healthy manner.   Collaboration of Care: Medication Management AEB V [name]  Patient/Guardian was advised Release of Information must be obtained prior to any record release in order to collaborate their care with an outside provider. Patient/Guardian was advised if they have not already done so to contact the registration department to sign all necessary forms in order for [place] to release information regarding their care.   Consent: Patient/Guardian gives verbal consent for treatment and assignment of benefits for services provided during this visit. Patient/Guardian expressed understanding and agreed to proceed.   Diagnosis: MDD (major depressive disorder), recurrent severe, without psychosis (HCC) [F33.2]    1. MDD (major depressive disorder), recurrent severe, without psychosis (HCC)   2. GAD (generalized anxiety disorder)   3. Autism spectrum disorder      Jill Guiles, LCSW

## 2022-04-16 NOTE — Psych (Signed)
Virtual Visit via Video Note  I connected with Jill Alexander on 02/15/22 at  9:00 AM EDT by a video enabled telemedicine application and verified that I am speaking with the correct person using two identifiers.  Location: Patient: patient home Provider: clinical home office   I discussed the limitations of evaluation and management by telemedicine and the availability of in person appointments. The patient expressed understanding and agreed to proceed.  I discussed the assessment and treatment plan with the patient. The patient was provided an opportunity to ask questions and all were answered. The patient agreed with the plan and demonstrated an understanding of the instructions.   The patient was advised to call back or seek an in-person evaluation if the symptoms worsen or if the condition fails to improve as anticipated.  Pt was provided 240 minutes of non-face-to-face time during this encounter.   Donia Guiles, LCSW   [facility] PHP THERAPIST PROGRESS NOTE  Jill Alexander [id number]  Session Time: 9:00 - 10:00  Participation Level: Active  Behavioral Response: CasualAlertDepressed  Type of Therapy: Group Therapy  Treatment Goals addressed: Coping  Progress Towards Goals: Initial  Interventions: CBT, DBT, Supportive, and Reframing  Summary: Jill Alexander is a 26 y.o. female who presents with depression symptoms.  Clinician led check-in regarding current stressors and situation, and review of patient completed daily inventory. Clinician utilized active listening and empathetic response and validated patient emotions. Clinician facilitated processing group on pertinent issues.?   Summary: Patient arrived within time allowed. Patient rates her mood at a 1.2 on a scale of 1-10 with 10 being best. Pt reports she slept less than 8 hours and she ate 2x. Pt identifies passive SI and denies plan or intent. Pt presents with paucity. Pt able to process.?Pt engaged  in discussion.?       Session Time: 10:00 am - 11:00 am  Participation Level: Active  Behavioral Response: CasualAlertAnxious and Depressed  Type of Therapy: Group Therapy  Treatment Goals addressed: Coping  Progress Towards Goals: Progressing  Interventions: CBT, DBT, Solution Focused, Strength-based, Supportive, and Reframing  Therapist Response: Cln continued topic of boundaries. Cln discussed the different ways boundaries present: physical, emotional, intellectual, sexual, material, and time. Group talked about the ways in which each type presents for them and is a struggle.   Summary:   Pt engaged in discussion and is able to recognize when each type is problematic for them.        Session Time: 11:00 am - 12:00 pm   Participation Level: Active   Behavioral Response: CasualAlertAnxious and Depressed   Type of Therapy: Group Therapy   Treatment Goals addressed: Coping   Progress Towards Goals: Progressing   Interventions: CBT, DBT, Solution Focused, Strength-based, Supportive, and Reframing   Therapist Response: Cln led discussion on ways to manage stressors and feelings over the weekend. Group members  brainstormed things to do over the weekend for multiple levels of energy, access, and moods. Cln reviewed crisis services should they be needed and provided pt's with the text crisis line, mobile crisis, national suicide hotline, [facility] 24/7 line, and information on [facility].      Summary: Pt engaged in discussion and is able to identify 3 ideas of what to do over the weekend to keep their mind engaged.     Session Time: 12:00 pm - 1:00 pm  Participation Level: Active  Behavioral Response: CasualAlertAnxious and Depressed  Type of Therapy: Group Therapy  Treatment Goals addressed:  Coping  Progress Towards Goals: Progressing  Interventions: CBT, DBT, Solution Focused, Strength-based, Supportive, and Reframing  Therapist Response: 12:00 - 12:50 pm: See  OT note.  12:50 - 1:00 pm: Clinician led check-out. Clinician assessed for immediate needs, medication compliance and efficacy, and safety concerns?  Summary: 12:00 - 12:50 pm: See OT note   12:50 - 1:00 pm: At check-out, patient contracts for safety.?Patient demonstrates progress as evidenced by continued engagement in group and responsiveness to treatment. Patient denies SI/HI/self-harm thoughts at the end of group and agrees to seek help should those thoughts/feelings occur.?    Suicidal/Homicidal: Yeswithout intent/plan  Plan: Pt will continue in PHP while working to decrease depression and anxiety symptoms, increase ADLS, and increase ability to manage symptoms in a healthy manner.   Collaboration of Care: Medication Management AEB V [name]  Patient/Guardian was advised Release of Information must be obtained prior to any record release in order to collaborate their care with an outside provider. Patient/Guardian was advised if they have not already done so to contact the registration department to sign all necessary forms in order for [place] to release information regarding their care.   Consent: Patient/Guardian gives verbal consent for treatment and assignment of benefits for services provided during this visit. Patient/Guardian expressed understanding and agreed to proceed.   Diagnosis: MDD (major depressive disorder), recurrent severe, without psychosis (HCC) [F33.2]    1. MDD (major depressive disorder), recurrent severe, without psychosis (HCC)   2. Autism spectrum disorder      Donia Guiles, LCSW

## 2022-04-17 ENCOUNTER — Ambulatory Visit (INDEPENDENT_AMBULATORY_CARE_PROVIDER_SITE_OTHER): Payer: No Typology Code available for payment source | Admitting: Clinical

## 2022-04-17 DIAGNOSIS — F840 Autistic disorder: Secondary | ICD-10-CM | POA: Diagnosis not present

## 2022-04-17 NOTE — Progress Notes (Signed)
Time: 3:05pm-4:00pm CPT Code: [id number] Diagnosis: F84.0  Jill Alexander was seen remotely using secure video conferencing. She was in her home and the therapist was in her home at the time of the appointment. Suicidal ideation was denied in the past week. Jill Alexander agreed to tell her parents if it returns and go to the hospital if she begins to have a plan. Session focused on creating a coping plan for the next week. Jill Alexander was able to identify several activities. Therapist provided psychoeducation on behavioral activation, including performing coping strategies even when she does not feel like it, and provided the list to Jill Alexander. She is scheduled to be seen again in two weeks.   [facility] Counselor/Therapist Progress Note   Patient ID: Jill Alexander, MRN: [id number],     Individualized Treatment Plan Strengths: Enjoys reading, Seeking counseling, In training program through [employer], "I like humor and jokes"  Supports: Her dog, her bestfriend since childhood, her parents, her sister    Goal/Needs for Treatment:  In order of importance to patient 1) Emotional Regulation/Anxiety 2) Improve Positive self talk      Client Statement of Needs: "Find ways of being nicer to myself", cope w/ anxiety and worry    Treatment Level:Outpatient Counseling  Symptoms:Anxiety, Perseverative worry, Negative self talk  Client Treatment Preferences:weekly counseling, return to Dr. Dewayne Hatch in Feb 2023 Prefers to be called Jill Alexander    Healthcare consumer's goal for treatment:   [name], [facility] will support the patient's ability to achieve the goals identified. Cognitive Behavioral Therapy, Supportive Counseling, Mindfulness and Relaxation Strategies and other evidenced-based practices will be used to promote progress towards healthy functioning.    Healthcare consumer will: Actively participate in therapy, working towards healthy functioning.     *Justification for Continuation/Discontinuation  of Goal: R=Revised, O=Ongoing, A=Achieved, D=Discontinued   Goal 1) Jill Alexander will develop strategies to regulate her emotions and improve her positive self talk Likert rating baseline date 04/12/21: How Easy - 3; How Often - 50% Target Date Goal Was reviewed Status Code Progress towards goal/Likert rating  04/13/23   New Maintained                      Jill Alexander participated in development of tx plan and provided verbal consent.                  Chrissie Noa, PhD               Chrissie Noa, PhD               Chrissie Noa, PhD               Chrissie Noa, PhD               Chrissie Noa, PhD               Chrissie Noa, PhD                 Chrissie Noa, PhD               Chrissie Noa, PhD

## 2022-04-23 ENCOUNTER — Other Ambulatory Visit (HOSPITAL_COMMUNITY): Payer: Self-pay

## 2022-04-24 ENCOUNTER — Other Ambulatory Visit (HOSPITAL_COMMUNITY): Payer: Self-pay

## 2022-04-24 ENCOUNTER — Other Ambulatory Visit: Payer: Self-pay

## 2022-04-25 ENCOUNTER — Other Ambulatory Visit (HOSPITAL_COMMUNITY): Payer: Self-pay

## 2022-04-27 ENCOUNTER — Other Ambulatory Visit (HOSPITAL_COMMUNITY): Payer: Self-pay | Admitting: Psychiatry

## 2022-04-27 ENCOUNTER — Other Ambulatory Visit (HOSPITAL_COMMUNITY): Payer: Self-pay

## 2022-04-27 DIAGNOSIS — F410 Panic disorder [episodic paroxysmal anxiety] without agoraphobia: Secondary | ICD-10-CM

## 2022-04-27 DIAGNOSIS — F411 Generalized anxiety disorder: Secondary | ICD-10-CM

## 2022-04-29 ENCOUNTER — Other Ambulatory Visit: Payer: Self-pay

## 2022-04-30 ENCOUNTER — Other Ambulatory Visit (HOSPITAL_COMMUNITY): Payer: Self-pay

## 2022-04-30 ENCOUNTER — Other Ambulatory Visit: Payer: Self-pay | Admitting: Neurology

## 2022-04-30 ENCOUNTER — Telehealth (HOSPITAL_COMMUNITY): Payer: Self-pay | Admitting: *Deleted

## 2022-04-30 MED ORDER — TOPIRAMATE 50 MG PO TABS
150.0000 mg | ORAL_TABLET | Freq: Every day | ORAL | 0 refills | Status: DC
  Filled 2022-04-30 – 2022-07-20 (×3): qty 270, 90d supply, fill #0

## 2022-04-30 MED ORDER — MYRBETRIQ 50 MG PO TB24
50.0000 mg | ORAL_TABLET | Freq: Every day | ORAL | 3 refills | Status: DC
  Filled 2022-04-30 – 2022-05-06 (×2): qty 30, 30d supply, fill #0
  Filled 2022-05-26 – 2022-05-30 (×2): qty 30, 30d supply, fill #1
  Filled 2022-06-02 – 2022-06-22 (×2): qty 30, 30d supply, fill #2
  Filled 2022-08-04: qty 30, 30d supply, fill #3

## 2022-04-30 NOTE — Telephone Encounter (Signed)
Updated [employer] auth:  Split- [employer] no auth req via [employer] ref # [id number]   Patient is scheduled at [facility] for 05/05/21 at 9 pm.

## 2022-04-30 NOTE — Telephone Encounter (Signed)
Patient called asking for refill of Auvelity .Next appointment 05/09/22.

## 2022-04-30 NOTE — Telephone Encounter (Signed)
Rx sent 

## 2022-05-01 ENCOUNTER — Ambulatory Visit (INDEPENDENT_AMBULATORY_CARE_PROVIDER_SITE_OTHER): Payer: Medicaid Other | Admitting: Clinical

## 2022-05-01 ENCOUNTER — Other Ambulatory Visit (HOSPITAL_COMMUNITY): Payer: Self-pay

## 2022-05-01 DIAGNOSIS — F840 Autistic disorder: Secondary | ICD-10-CM

## 2022-05-01 NOTE — Progress Notes (Signed)
Time: 4:00pm-5:00pm CPT Code: [id number] Diagnosis: F84.0  Jill Alexander was seen in person for individual therapy. She reflected upon a sense of lack of purpose in her life. Therapist worked with her to consider ways to create purpose. She indicated that she had contacted [employer] about volunteer opportunities in September, but had never heard back. For homework, she will re-send her email. She also reported that she had schedule a neuropsych eval and sleep study, and expressed optimism about more information from these assessments. Therapist engaged her in a review and update of her treatment plan, and Jill Alexander provided input and consent to all goals. She is scheduled to be seen again in one week.  Intake Presenting Problem Jill Alexander presented seeking CBT to help her manage difficulties with emotion regulation and reframing negative thought patterns that contribute to anxiety and worry. Jill Alexander has a diagnosis of Autism Spectrum Disorder (ASD). Symptoms feeling nervous or on edge, difficulty controlling worry, worrying about a variety of things, difficulty relaxing, irritability, fears for the future, feelings of hopelessness, difficulty sleeping, feeling tired, appetite irregularities, poor self-image, passive suicidal ideation without plan or intent (client contracted for safety during initial session) History of Problem  Jill Alexander reported that she moved from [place] with her family in July of 2018. Since that time, she has been seen by a provider in the area for challenges with emotion regulation and anxiety related to her diagnosis of autism spectrum disorder. Jill Alexander lives with her parents. She has a 79 year-old sister who has struggled with similar issues, as well as a brother who is three years older than she is and lives in [place]. Jill Alexander described a long history of therapy and therapeutic programs in order to help her manage these issues, including a partial hospitalization program in December of 2015.  Recent  Trigger  Jill Alexander reported that she was seeking a change in pace with regard to her therapeutic treatment in order to ensure continued progress.  Marital and Family Information  Present family concerns/problems: Jill Alexander shared several accounts of disputes with her mother, including her mother becoming upset with her for sharing worries with her immediately after her mother has woken up, despite her mother's requests that she not do so.  Strengths/resources in the family/friends: Jill Alexander described overall supportive relationships with her parents.  Marital/sexual history patterns: N/A  Family of Origin  Problems in family of origin: None reported.  Family background / ethnic factors: none reported.  No needs/concerns related to ethnicity reported when asked: No  Education/Vocation  Interpersonal concerns/problems: Jill Alexander reported that she has difficulties with social interactions related to her ASD diagnosis, and often perseverates about perceived missteps in both the immediate and distant past.  Personal strengths: Jill Alexander presented as motivated to improve her situation, and with some insight as to what is effective for her.  Military/work problems/concerns: None noted.  Leisure Activities/Daily Functioning  impaired functioning  Legal Status  No Legal Problems  Medical/Nutritional Concerns  unspecified  Comments: Jill Alexander reported that she often perseverates on perceived medical problems, and described herself as a hypochondriac  Religion/Spirituality  Not reported  Other  General Behavior: cooperative  Attire: appropriate  Gait: normal  Motor Activity: normal  Stream of Thought - Productivity: spontaneous  Stream of thought - Progression: normal  Stream of thought - Language: normal  Emotional tone and reactions - Affect: appropriate  Mental trend/Content of thoughts - Perception: normal  Mental trend/Content of thoughts - Orientation: normal  Mental trend/Content of thoughts - Memory: normal   Mental  trend/Content of thoughts - General knowledge: consistent with education  Insight: good  Judgment: good  Intelligence: average  Diagnostic Summary  Autism Spectrum Disorder (F84.0)   [place] Counselor/Therapist Progress Note   Patient ID: Raissa Dam, MRN: [id number],     Individualized Treatment Plan Strengths: Enjoys reading, Seeking counseling, In training program through [employer], "I like humor and jokes"  Supports: Her dog, her bestfriend since childhood, her parents, her sister    Goal/Needs for Treatment:  In order of importance to patient 1) Emotional Regulation/Anxiety 2) Improve Positive self talk      Client Statement of Needs: "Find ways of being nicer to myself", cope w/ anxiety and worry    Treatment Level:Outpatient Counseling  Symptoms:Anxiety, Perseverative worry, Negative self talk  Client Treatment Preferences:weekly counseling,  Prefers to be called Jill Alexander    Healthcare consumer's goal for treatment:  Dr. Laroy Apple will support the patient's ability to achieve the goals identified. Cognitive Behavioral Therapy, Supportive Counseling, Mindfulness and Relaxation Strategies and other evidenced-based practices will be used to promote progress towards healthy functioning.    Healthcare consumer will: Actively participate in therapy, working towards healthy functioning.     *Justification for Continuation/Discontinuation of Goal: R=Revised, O=Ongoing, A=Achieved, D=Discontinued   Goal 1) Jill Alexander will develop strategies to regulate her emotions and improve her positive self talk Likert rating baseline date 04/12/21: How Easy - 3; How Often - 50% Target Date Goal Was reviewed Status Code Progress towards goal/Likert rating  05/02/23   New Ongoing                      Jill Alexander participated in development of tx plan and provided verbal consent.          Myrtie Cruise, PhD

## 2022-05-02 ENCOUNTER — Telehealth (HOSPITAL_COMMUNITY): Payer: Self-pay

## 2022-05-02 ENCOUNTER — Other Ambulatory Visit (HOSPITAL_COMMUNITY): Payer: Self-pay

## 2022-05-02 ENCOUNTER — Other Ambulatory Visit: Payer: Self-pay

## 2022-05-02 ENCOUNTER — Other Ambulatory Visit (HOSPITAL_COMMUNITY): Payer: Self-pay | Admitting: Psychiatry

## 2022-05-02 DIAGNOSIS — F332 Major depressive disorder, recurrent severe without psychotic features: Secondary | ICD-10-CM

## 2022-05-02 MED ORDER — LORAZEPAM 1 MG PO TABS
1.0000 mg | ORAL_TABLET | Freq: Two times a day (BID) | ORAL | 0 refills | Status: DC | PRN
  Filled 2022-05-02 – 2022-05-06 (×2): qty 60, 30d supply, fill #0

## 2022-05-02 MED ORDER — AUVELITY 45-105 MG PO TBCR
1.0000 | EXTENDED_RELEASE_TABLET | Freq: Two times a day (BID) | ORAL | 0 refills | Status: DC
  Filled 2022-05-02: qty 60, 30d supply, fill #0

## 2022-05-02 NOTE — Telephone Encounter (Signed)
PA initiated for Auvelity 45-105 mg er tablets via CoverMyMeds  Approved  Coverage 03/20/2022 to 05/19/2022 Patient and pharmacy made aware

## 2022-05-05 ENCOUNTER — Ambulatory Visit (INDEPENDENT_AMBULATORY_CARE_PROVIDER_SITE_OTHER): Payer: 59 | Admitting: Neurology

## 2022-05-05 DIAGNOSIS — G4719 Other hypersomnia: Secondary | ICD-10-CM

## 2022-05-05 DIAGNOSIS — R5381 Other malaise: Secondary | ICD-10-CM

## 2022-05-05 DIAGNOSIS — R0683 Snoring: Secondary | ICD-10-CM

## 2022-05-05 DIAGNOSIS — G478 Other sleep disorders: Secondary | ICD-10-CM

## 2022-05-05 DIAGNOSIS — F840 Autistic disorder: Secondary | ICD-10-CM

## 2022-05-05 DIAGNOSIS — F0630 Mood disorder due to known physiological condition, unspecified: Secondary | ICD-10-CM

## 2022-05-05 DIAGNOSIS — G4720 Circadian rhythm sleep disorder, unspecified type: Secondary | ICD-10-CM

## 2022-05-06 ENCOUNTER — Other Ambulatory Visit: Payer: Self-pay

## 2022-05-06 ENCOUNTER — Other Ambulatory Visit (HOSPITAL_COMMUNITY): Payer: Self-pay

## 2022-05-07 ENCOUNTER — Ambulatory Visit: Payer: 59 | Admitting: Clinical

## 2022-05-07 DIAGNOSIS — F840 Autistic disorder: Secondary | ICD-10-CM

## 2022-05-07 NOTE — Progress Notes (Signed)
Time: 11:00 am-11:20 am CPT Code: NO CHARGE Diagnosis: F84.0  Jill Alexander was seen in person for individual therapy. She shared several steps she had taken since her last session, including having contacted [employer] and reached out again to [employer]. However, session had to end early when therapist received notification that she would have to pick up her children from school. Therapist indicated a plan to reschedule Jill Alexander for a full-length session within the week, and reached out via secure email to offer the following day at 10am.  Intake Presenting Problem Jill Alexander presented seeking CBT to help her manage difficulties with emotion regulation and reframing negative thought patterns that contribute to anxiety and worry. Jill Alexander has a diagnosis of Autism Spectrum Disorder (ASD). Symptoms feeling nervous or on edge, difficulty controlling worry, worrying about a variety of things, difficulty relaxing, irritability, fears for the future, feelings of hopelessness, difficulty sleeping, feeling tired, appetite irregularities, poor self-image, passive suicidal ideation without plan or intent (client contracted for safety during initial session) History of Problem  Jill Alexander reported that she moved from [place] with her family in July of 2018. Since that time, she has been seen by a provider in the area for challenges with emotion regulation and anxiety related to her diagnosis of autism spectrum disorder. Jill Alexander lives with her parents. She has a 31 year-old sister who has struggled with similar issues, as well as a brother who is three years older than she is and lives in [place]. Jill Alexander described a long history of therapy and therapeutic programs in order to help her manage these issues, including a partial hospitalization program in December of 2015.  Recent Trigger  Jill Alexander reported that she was seeking a change in pace with regard to her therapeutic treatment in order to ensure continued progress.  Marital and Family  Information  Present family concerns/problems: Jill Alexander shared several accounts of disputes with her mother, including her mother becoming upset with her for sharing worries with her immediately after her mother has woken up, despite her mother's requests that she not do so.  Strengths/resources in the family/friends: Jill Alexander described overall supportive relationships with her parents.  Marital/sexual history patterns: N/A  Family of Origin  Problems in family of origin: None reported.  Family background / ethnic factors: none reported.  No needs/concerns related to ethnicity reported when asked: No  Education/Vocation  Interpersonal concerns/problems: Jill Alexander reported that she has difficulties with social interactions related to her ASD diagnosis, and often perseverates about perceived missteps in both the immediate and distant past.  Personal strengths: Jill Alexander presented as motivated to improve her situation, and with some insight as to what is effective for her.  Military/work problems/concerns: None noted.  Leisure Activities/Daily Functioning  impaired functioning  Legal Status  No Legal Problems  Medical/Nutritional Concerns  unspecified  Comments: Jill Alexander reported that she often perseverates on perceived medical problems, and described herself as a hypochondriac  Religion/Spirituality  Not reported  Other  General Behavior: cooperative  Attire: appropriate  Gait: normal  Motor Activity: normal  Stream of Thought - Productivity: spontaneous  Stream of thought - Progression: normal  Stream of thought - Language: normal  Emotional tone and reactions - Affect: appropriate  Mental trend/Content of thoughts - Perception: normal  Mental trend/Content of thoughts - Orientation: normal  Mental trend/Content of thoughts - Memory: normal  Mental trend/Content of thoughts - General knowledge: consistent with education  Insight: good  Judgment: good  Intelligence: average  Diagnostic Summary   Autism Spectrum Disorder (F84.0)  [facility] Counselor/Therapist Progress Note   Patient ID: Jill Alexander, MRN: [id number],     Individualized Treatment Plan Strengths: Enjoys reading, Seeking counseling, In training program through [employer], "I like humor and jokes"  Supports: Her dog, her bestfriend since childhood, her parents, her sister    Goal/Needs for Treatment:  In order of importance to patient 1) Emotional Regulation/Anxiety 2) Improve Positive self talk      Client Statement of Needs: "Find ways of being nicer to myself", cope w/ anxiety and worry    Treatment Level:Outpatient Counseling  Symptoms:Anxiety, Perseverative worry, Negative self talk  Client Treatment Preferences:weekly counseling,  Prefers to be called Jill Alexander    Healthcare consumer's goal for treatment:  Dr. Charlyne Mom will support the patient's ability to achieve the goals identified. Cognitive Behavioral Therapy, Supportive Counseling, Mindfulness and Relaxation Strategies and other evidenced-based practices will be used to promote progress towards healthy functioning.    Healthcare consumer will: Actively participate in therapy, working towards healthy functioning.     *Justification for Continuation/Discontinuation of Goal: R=Revised, O=Ongoing, A=Achieved, D=Discontinued   Goal 1) Jill Alexander will develop strategies to regulate her emotions and improve her positive self talk Likert rating baseline date 04/12/21: How Easy - 3; How Often - 50% Target Date Goal Was reviewed Status Code Progress towards goal/Likert rating  05/02/23   New Ongoing                      Jill Alexander participated in development of tx plan and provided verbal consent.          Chrissie Noa, PhD               Chrissie Noa, PhD

## 2022-05-08 ENCOUNTER — Other Ambulatory Visit (HOSPITAL_COMMUNITY): Payer: Self-pay

## 2022-05-08 ENCOUNTER — Ambulatory Visit (INDEPENDENT_AMBULATORY_CARE_PROVIDER_SITE_OTHER): Payer: 59 | Admitting: Clinical

## 2022-05-08 DIAGNOSIS — F840 Autistic disorder: Secondary | ICD-10-CM | POA: Diagnosis not present

## 2022-05-08 NOTE — Progress Notes (Signed)
Time: 10:00 am-11:00 am CPT Code: 42706C Diagnosis: F84.0  Jill Alexander was seen in person for individual therapy. She shared that she had initiated contact with several volunteer agencies, and therapist engaged her in a discussion of her preferences and options in this area, as well as shared an additional resource. Jill Alexander expressed interest in writing a book and/or starting a podcast. For homework, she will brain storm ideas for these endeavors by setting a timer for 5 minutes and writing down whatever comes to mind without judging it. She is scheduled to be seen again in one week.  Intake Presenting Problem Jill Alexander presented seeking CBT to help her manage difficulties with emotion regulation and reframing negative thought patterns that contribute to anxiety and worry. Jill Alexander has a diagnosis of Autism Spectrum Disorder (ASD). Symptoms feeling nervous or on edge, difficulty controlling worry, worrying about a variety of things, difficulty relaxing, irritability, fears for the future, feelings of hopelessness, difficulty sleeping, feeling tired, appetite irregularities, poor self-image, passive suicidal ideation without plan or intent (client contracted for safety during initial session) History of Problem  Jill Alexander reported that she moved from [place] with her family in July of 2018. Since that time, she has been seen by a provider in the area for challenges with emotion regulation and anxiety related to her diagnosis of autism spectrum disorder. Jill Alexander lives with her parents. She has a 76 year-old sister who has struggled with similar issues, as well as a brother who is three years older than she is and lives in [place]. Jill Alexander described a long history of therapy and therapeutic programs in order to help her manage these issues, including a partial hospitalization program in December of 2015.  Recent Trigger  Jill Alexander reported that she was seeking a change in pace with regard to her therapeutic treatment in order to  ensure continued progress.  Marital and Family Information  Present family concerns/problems: Jill Alexander shared several accounts of disputes with her mother, including her mother becoming upset with her for sharing worries with her immediately after her mother has woken up, despite her mother's requests that she not do so.  Strengths/resources in the family/friends: Jill Alexander described overall supportive relationships with her parents.  Marital/sexual history patterns: N/A  Family of Origin  Problems in family of origin: None reported.  Family background / ethnic factors: none reported.  No needs/concerns related to ethnicity reported when asked: No  Education/Vocation  Interpersonal concerns/problems: Jill Alexander reported that she has difficulties with social interactions related to her ASD diagnosis, and often perseverates about perceived missteps in both the immediate and distant past.  Personal strengths: Jill Alexander presented as motivated to improve her situation, and with some insight as to what is effective for her.  Military/work problems/concerns: None noted.  Leisure Activities/Daily Functioning  impaired functioning  Legal Status  No Legal Problems  Medical/Nutritional Concerns  unspecified  Comments: Jill Alexander reported that she often perseverates on perceived medical problems, and described herself as a hypochondriac  Religion/Spirituality  Not reported  Other  General Behavior: cooperative  Attire: appropriate  Gait: normal  Motor Activity: normal  Stream of Thought - Productivity: spontaneous  Stream of thought - Progression: normal  Stream of thought - Language: normal  Emotional tone and reactions - Affect: appropriate  Mental trend/Content of thoughts - Perception: normal  Mental trend/Content of thoughts - Orientation: normal  Mental trend/Content of thoughts - Memory: normal  Mental trend/Content of thoughts - General knowledge: consistent with education  Insight: good  Judgment: good   Intelligence:  average  Diagnostic Summary  Autism Spectrum Disorder (F84.0)   [place] Counselor/Therapist Progress Note   Patient ID: Sharisa Toves, MRN: [id number],     Individualized Treatment Plan Strengths: Enjoys reading, Seeking counseling, In training program through [employer], "I like humor and jokes"  Supports: Her dog, her bestfriend since childhood, her parents, her sister    Goal/Needs for Treatment:  In order of importance to patient 1) Emotional Regulation/Anxiety 2) Improve Positive self talk      Client Statement of Needs: "Find ways of being nicer to myself", cope w/ anxiety and worry    Treatment Level:Outpatient Counseling  Symptoms:Anxiety, Perseverative worry, Negative self talk  Client Treatment Preferences:weekly counseling,  Prefers to be called Jill Alexander    Healthcare consumer's goal for treatment:  Dr. Laroy Apple will support the patient's ability to achieve the goals identified. Cognitive Behavioral Therapy, Supportive Counseling, Mindfulness and Relaxation Strategies and other evidenced-based practices will be used to promote progress towards healthy functioning.    Healthcare consumer will: Actively participate in therapy, working towards healthy functioning.     *Justification for Continuation/Discontinuation of Goal: R=Revised, O=Ongoing, A=Achieved, D=Discontinued   Goal 1) Jill Alexander will develop strategies to regulate her emotions and improve her positive self talk Likert rating baseline date 04/12/21: How Easy - 3; How Often - 50% Target Date Goal Was reviewed Status Code Progress towards goal/Likert rating  05/02/23   New Ongoing                      Jill Alexander participated in development of tx plan and provided verbal consent.     Myrtie Cruise, PhD               Myrtie Cruise, PhD

## 2022-05-08 NOTE — Procedures (Unsigned)
Physician Interpretation (study interpreted on behalf of Dr. Brett Fairy):   Referred by: Marcellina Millin  History: 27 year old female with an underlying medical history of migraine headaches, reflux disease, history of seizures, tremor, anxiety, depression, and autism spectrum disorder (by chart review), who reports snoring and nonrestorative sleep, as well as daytime somnolence and witnessed apneas.  Her Epworth sleepiness score is 15 out of 24, fatigue severity score is 51 out of 63.  EEG: Review of the EEG showed no abnormal electrical discharges and symmetrical bihemispheric findings.    EKG: The EKG revealed normal sinus rhythm (NSR).   AUDIO/VIDEO REVIEW: The audio and video review did not show any abnormal or unusual behaviors, movements, phonations or vocalizations with the exception of rare mumbling during sleep, ***. The patient took 1 restroom break.  Patient's mother stayed for the study. Snoring ranged from mild to loud.    IMPRESSION:    Primary Snoring Dysfunctions associated with sleep stages or arousal from sleep Sleep talking  RECOMMENDATIONS:   This study does not demonstrate any significant obstructive or central sleep disordered breathing with an AHI of less than 5/hour -total AHI was 1.8/h, O2 nadir 87%.  Snoring was noted, ranging from mild to loud at times. Treatment with a positive airway pressure device, such as CPAP or autoPAP is not indicated.  For disturbing snoring, a dental device through a dentist or orthodontics can be considered.  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 Exact pathophysiology is not known (occurs during brief arousals) and no specific treatment is available, and typically not sought by the patient. This study shows some sleep fragmentation (mostly mild) and mildly abnormal sleep stage percentages; these are nonspecific findings and per se do not signify an intrinsic sleep  disorder or a cause for the patient's sleep-related symptoms. Causes include (but are not limited to) the first night effect of the sleep study, circadian rhythm disturbances, medication effect or an underlying mood disorder or medical problem.  The patient should be cautioned not to drive, work at heights, or operate dangerous or heavy equipment when tired or sleepy. Review and reiteration of good sleep hygiene measures should be pursued with any patient. The patient and her referring provider will be notified of the test results. A follow-up in sleep clinic will be arranged as necessary.  I certify that I have reviewed the entire raw data recording prior to the issuance of this report in accordance with the Standards of Accreditation of the American Academy of Sleep Medicine (AASM).  Star Age, MD, PhD Medical Director, [facility]) [place], ABPN (Neurology and Sleep)   Technical Report:   ***

## 2022-05-09 ENCOUNTER — Telehealth: Payer: Self-pay | Admitting: Neurology

## 2022-05-09 ENCOUNTER — Other Ambulatory Visit (HOSPITAL_COMMUNITY): Payer: Self-pay

## 2022-05-09 ENCOUNTER — Telehealth (HOSPITAL_BASED_OUTPATIENT_CLINIC_OR_DEPARTMENT_OTHER): Payer: 59 | Admitting: Psychiatry

## 2022-05-09 DIAGNOSIS — F410 Panic disorder [episodic paroxysmal anxiety] without agoraphobia: Secondary | ICD-10-CM

## 2022-05-09 DIAGNOSIS — F411 Generalized anxiety disorder: Secondary | ICD-10-CM | POA: Diagnosis not present

## 2022-05-09 DIAGNOSIS — F332 Major depressive disorder, recurrent severe without psychotic features: Secondary | ICD-10-CM | POA: Diagnosis not present

## 2022-05-09 MED ORDER — AUVELITY 45-105 MG PO TBCR
1.0000 | EXTENDED_RELEASE_TABLET | Freq: Two times a day (BID) | ORAL | 0 refills | Status: DC
  Filled 2022-05-09 – 2022-05-26 (×3): qty 180, 90d supply, fill #0

## 2022-05-09 MED ORDER — LORAZEPAM 1 MG PO TABS
1.0000 mg | ORAL_TABLET | Freq: Two times a day (BID) | ORAL | 0 refills | Status: DC | PRN
  Filled 2022-05-09 – 2022-05-26 (×2): qty 60, 30d supply, fill #0

## 2022-05-09 MED ORDER — LURASIDONE HCL 40 MG PO TABS
40.0000 mg | ORAL_TABLET | Freq: Every day | ORAL | 0 refills | Status: DC
  Filled 2022-05-09 – 2022-06-29 (×3): qty 90, 90d supply, fill #0

## 2022-05-09 NOTE — Progress Notes (Signed)
Virtual Visit via Video Note  I connected with [name] on 05/09/22 at 10:30 AM EST by  a video enabled telemedicine application and verified that I am speaking with the correct person using two identifiers.  Location: Patient: home Provider: office   I discussed the limitations of evaluation and management by telemedicine and the availability of in person appointments. The patient expressed understanding and agreed to proceed.  History of Present Illness: Jill Alexander's dog was barking continuously and it made it hard to hear her clearly. She tried to get the dog to stop and also put on noise cancelling headphones but it did not help. Jill Alexander shares she continues to experience nausea and is willing to decrease and potentially stop Lamictal at the suggestion of her neurologist. The last time she had a seizure was in 2015. Jill Alexander also shares that her parents have noticed that she seems a little happier with Auvelity. She herself has not noticed a more positive affect. She denies any noticeable side effects. Auvelity has not been approved by her insurance and it can't be approved electronically.  Her sleep remains poor. She recently had a sleep study and is awaiting the results. She denies any SI/HI. She continues to have fleeting passive thoughts of death randomly. The frequency seems to have a decreased a little and is no longer daily. She has not been calling 988 suicide line lately either. She has noticed her anxiety is high but the panic attacks are no longer happening daily. Jill Alexander continues to have periods where she feels agitated for unknown reasons. She will end up snapping at people but denies getting violent. This happens a few times a week.    Observations/Objective: Psychiatric Specialty Exam: ROS  There were no vitals taken for this visit.There is no height or weight on file to calculate BMI.  General Appearance: Casual  Eye Contact:  Minimal  Speech:  Clear and Coherent and Slow   Volume:  Normal  Mood:  Anxious and Depressed  Affect:  Blunt  Thought Process:  Coherent and Descriptions of Associations: Circumstantial  Orientation:  Full (Time, Place, and Person)  Thought Content:  Logical  Suicidal Thoughts:  No  Homicidal Thoughts:  No  Memory:  Immediate;   Good  Judgement:  Good  Insight:  Fair  Psychomotor Activity:  Normal  Concentration:  Concentration: Fair  Recall:  Mina of Knowledge:  Good  Language:  Good  Akathisia:  No  Handed:  Right  AIMS (if indicated):     Assets:  Communication Skills Desire for Improvement Financial Resources/Insurance Housing Leisure Time Resilience Social Support Talents/Skills Transportation Vocational/Educational  ADL's:  Intact  Cognition:  WNL  Sleep:        Assessment and Plan:     03/14/2022   10:14 AM 02/21/2022   10:37 AM 02/12/2022   10:57 AM 02/06/2022   10:48 AM 01/17/2022    3:27 PM  Depression screen PHQ 2/9  Decreased Interest 2 2 3 3    Down, Depressed, Hopeless 3 3 3 3 3   PHQ - 2 Score 5 5 6 6 3   Altered sleeping 1 1 3 3 3   Tired, decreased energy 3 3 3 3 3   Change in appetite 3 3 3 2  0  Feeling bad or failure about yourself  3 3 3 3 3   Trouble concentrating 3 3 3 3 3   Moving slowly or fidgety/restless 2 2 2 2  0  Suicidal thoughts 3 3 3 3  0  PHQ-9 Score 23 23 26 25 15   Difficult doing work/chores Extremely dIfficult  Extremely dIfficult Very difficult Very difficult    Flowsheet Row Video Visit from 03/14/2022 in [facility] Counselor from 02/21/2022 in [facility] Counselor from 02/12/2022 in [facility]  C-SSRS RISK CATEGORY Error: Q7 should not be populated when Q6 is No Error: Question 6 not populated Error: Question 6 not populated          Pt is aware that these meds carry a teratogenic risk. Pt will discuss plan of action if she does or plans to  become pregnant in the future.  Status of current problems: family notes small improvement in mood but Jill Alexander has not. Her anxiety is ongoing.    Medication management with supportive therapy. Risks and benefits, side effects and alternative treatment options discussed with patient. Pt was given an opportunity to ask questions about medication, illness, and treatment. All current psychiatric medications have been reviewed and discussed with the patient and adjusted as clinically appropriate.  Pt verbalized understanding and verbal consent obtained for treatment.  Meds: I agreed with her neurologist recommendation of decreasing and stopping Lamictal to see if her nausea improves. We discussed how it could cause worsening of her depression and seizures. She verbalized understanding. She will contact her neurologist to start the taper.   - we discussed getting a coupon savings card for Auvelity to help decrease the cost. She will look on their website. 1. MDD (major depressive disorder), recurrent severe, without psychosis (HCC) - Dextromethorphan-buPROPion ER (AUVELITY) 45-105 MG TBCR; Take 1 tablet by mouth 2 (two) times daily.  Dispense: 180 tablet; Refill: 0 - lurasidone (LATUDA) 40 MG TABS tablet; Take 1 tablet (40 mg total) by mouth daily with breakfast.  Dispense: 90 tablet; Refill: 0  2. Panic attacks - lurasidone (LATUDA) 40 MG TABS tablet; Take 1 tablet (40 mg total) by mouth daily with breakfast.  Dispense: 90 tablet; Refill: 0 - LORazepam (ATIVAN) 1 MG tablet; Take 1 tablet (1 mg total) by mouth 2 (two) times daily as needed for anxiety.  Dispense: 60 tablet; Refill: 0  3. GAD (generalized anxiety disorder) - lurasidone (LATUDA) 40 MG TABS tablet; Take 1 tablet (40 mg total) by mouth daily with breakfast.  Dispense: 90 tablet; Refill: 0 - LORazepam (ATIVAN) 1 MG tablet; Take 1 tablet (1 mg total) by mouth 2 (two) times daily as needed for anxiety.  Dispense: 60 tablet; Refill:  0     Labs: none today    Therapy: brief supportive therapy provided. Discussed psychosocial stressors in detail.     Collaboration of Care: Other neurologist  Patient/Guardian was advised Release of Information must be obtained prior to any record release in order to collaborate their care with an outside provider. Patient/Guardian was advised if they have not already done so to contact the registration department to sign all necessary forms in order for 02/14/2022 to release information regarding their care.   Consent: Patient/Guardian gives verbal consent for treatment and assignment of benefits for services provided during this visit. Patient/Guardian expressed understanding and agreed to proceed.      Pt's acute risk factors for suicide are ongoing depression symptoms. Pt's chronic risk factors are family hx of mental illness. Pt's protective factors are denying active SI with plan or intent, taking meds as prescribed, positive social support, living with family, being future oriented, no hx of substance abuse, no known hx of suicide attempts,  no known access to guns. Pt has on/off chronic passive thoughts of death. Pt denies SI and is at an acute low risk for suicide. Patient told to call clinic if any problems occur. Patient advised to go to ER if they should develop SI/HI, side effects, or if symptoms worsen. Pt has crisis numbers to call if needed. Pt acknowledged and agreed with plan and verbalized understanding.  Follow Up Instructions: Follow up in 2 months or sooner if needed    I discussed the assessment and treatment plan with the patient. The patient was provided an opportunity to ask questions and all were answered. The patient agreed with the plan and demonstrated an understanding of the instructions.   The patient was advised to call back or seek an in-person evaluation if the symptoms worsen or if the condition fails to improve as anticipated.  I provided 28 minutes of  non-face-to-face time during this encounter.   Charlcie Cradle, MD

## 2022-05-09 NOTE — Telephone Encounter (Signed)
This patient saw Dr. Brett Fairy for sleep evaluation on 03/18/22.  I read the PSG from 05/05/22 on Dr. Edwena Felty behalf:   Please call and notify the patient or mom that the recent sleep study did not show any significant obstructive sleep apnea. She had snoring, which ranged from mild to loud. A CPAP machine is not indicated; for disturbing snoring, an oral appliance (through a qualified dentist) can be considered. We can facilitate with a referral, if they would like.  She had brief sleep talking episodes, noted during dream sleep with mild leg movements, no flailing or larger movements.  She can FU with Dr. Brett Fairy for further discussion if they would like. Pls schedule FU if desired.   Thanks,  Star Age, MD, PhD [facility])

## 2022-05-14 ENCOUNTER — Ambulatory Visit (INDEPENDENT_AMBULATORY_CARE_PROVIDER_SITE_OTHER): Payer: 59 | Admitting: Clinical

## 2022-05-14 DIAGNOSIS — F840 Autistic disorder: Secondary | ICD-10-CM | POA: Diagnosis not present

## 2022-05-14 NOTE — Progress Notes (Signed)
Time: 11:00 am-12:00 pm CPT Code: 63875I Diagnosis: F84.0  Jill Alexander was seen in person for individual therapy. She rated her depression at a 3.5, and reported several steps toward increasing activity. Session focused on processing her efforts at finding fulfilling employment. She indicated a plan to reach out to her previous employer to better understand circumstances surrounding her termination. She is scheduled to be seen again in one week.  Intake Presenting Problem Jill Alexander presented seeking CBT to help her manage difficulties with emotion regulation and reframing negative thought patterns that contribute to anxiety and worry. Jill Alexander has a diagnosis of Autism Spectrum Disorder (ASD). Symptoms feeling nervous or on edge, difficulty controlling worry, worrying about a variety of things, difficulty relaxing, irritability, fears for the future, feelings of hopelessness, difficulty sleeping, feeling tired, appetite irregularities, poor self-image, passive suicidal ideation without plan or intent (client contracted for safety during initial session) History of Problem  Jill Alexander reported that she moved from [place] with her family in July of 2018. Since that time, she has been seen by a provider in the area for challenges with emotion regulation and anxiety related to her diagnosis of autism spectrum disorder. Jill Alexander lives with her parents. She has a 29 year-old sister who has struggled with similar issues, as well as a brother who is three years older than she is and lives in [place]. Jill Alexander described a long history of therapy and therapeutic programs in order to help her manage these issues, including a partial hospitalization program in December of 2015.  Recent Trigger  Jill Alexander reported that she was seeking a change in pace with regard to her therapeutic treatment in order to ensure continued progress.  Marital and Family Information  Present family concerns/problems: Jill Alexander shared several accounts of disputes  with her mother, including her mother becoming upset with her for sharing worries with her immediately after her mother has woken up, despite her mother's requests that she not do so.  Strengths/resources in the family/friends: Jill Alexander described overall supportive relationships with her parents.  Marital/sexual history patterns: N/A  Family of Origin  Problems in family of origin: None reported.  Family background / ethnic factors: none reported.  No needs/concerns related to ethnicity reported when asked: No  Education/Vocation  Interpersonal concerns/problems: Jill Alexander reported that she has difficulties with social interactions related to her ASD diagnosis, and often perseverates about perceived missteps in both the immediate and distant past.  Personal strengths: Jill Alexander presented as motivated to improve her situation, and with some insight as to what is effective for her.  Military/work problems/concerns: None noted.  Leisure Activities/Daily Functioning  impaired functioning  Legal Status  No Legal Problems  Medical/Nutritional Concerns  unspecified  Comments: Jill Alexander reported that she often perseverates on perceived medical problems, and described herself as a hypochondriac  Religion/Spirituality  Not reported  Other  General Behavior: cooperative  Attire: appropriate  Gait: normal  Motor Activity: normal  Stream of Thought - Productivity: spontaneous  Stream of thought - Progression: normal  Stream of thought - Language: normal  Emotional tone and reactions - Affect: appropriate  Mental trend/Content of thoughts - Perception: normal  Mental trend/Content of thoughts - Orientation: normal  Mental trend/Content of thoughts - Memory: normal  Mental trend/Content of thoughts - General knowledge: consistent with education  Insight: good  Judgment: good  Intelligence: average  Diagnostic Summary  Autism Spectrum Disorder (F84.0)   [place] Counselor/Therapist Progress  Note   Patient ID: Jill Alexander, MRN: [id number],  Individualized Treatment Plan Strengths: Enjoys reading, Seeking counseling, In training program through [employer], "I like humor and jokes"  Supports: Her dog, her bestfriend since childhood, her parents, her sister    Goal/Needs for Treatment:  In order of importance to patient 1) Emotional Regulation/Anxiety 2) Improve Positive self talk      Client Statement of Needs: "Find ways of being nicer to myself", cope w/ anxiety and worry    Treatment Level:Outpatient Counseling  Symptoms:Anxiety, Perseverative worry, Negative self talk  Client Treatment Preferences:weekly counseling,  Prefers to be called Jill Alexander    Healthcare consumer's goal for treatment:  Dr. Laroy Apple will support the patient's ability to achieve the goals identified. Cognitive Behavioral Therapy, Supportive Counseling, Mindfulness and Relaxation Strategies and other evidenced-based practices will be used to promote progress towards healthy functioning.    Healthcare consumer will: Actively participate in therapy, working towards healthy functioning.     *Justification for Continuation/Discontinuation of Goal: R=Revised, O=Ongoing, A=Achieved, D=Discontinued   Goal 1) Jill Alexander will develop strategies to regulate her emotions and improve her positive self talk Likert rating baseline date 04/12/21: How Easy - 3; How Often - 50% Target Date Goal Was reviewed Status Code Progress towards goal/Likert rating  05/02/23   New Ongoing                      Jill Alexander participated in development of tx plan and provided verbal consent.     Myrtie Cruise, PhD         Myrtie Cruise, PhD

## 2022-05-16 ENCOUNTER — Other Ambulatory Visit (HOSPITAL_COMMUNITY): Payer: Self-pay

## 2022-05-17 ENCOUNTER — Other Ambulatory Visit (HOSPITAL_COMMUNITY): Payer: Self-pay

## 2022-05-21 ENCOUNTER — Ambulatory Visit: Payer: 59 | Admitting: Clinical

## 2022-05-21 DIAGNOSIS — F840 Autistic disorder: Secondary | ICD-10-CM | POA: Diagnosis not present

## 2022-05-21 NOTE — Progress Notes (Signed)
Time: 11:00 am-12:00 pm CPT Code: 67619J Diagnosis: F84.0  Jill Alexander was seen remotely using secure video conferencing. She was in her home and therapist was in her office at the time of the appointment. Session focused on an email she had sent the therapist the evening of 1/18 where she had expressed despair over her tendency to lash out at family, and concern over whether she would be able to hold a job. Therapist pointed out catastrophizing and processed these thoughts with Jill Alexander. She is scheduled to be seen again in one week.  Intake Presenting Problem Jill Alexander presented seeking CBT to help her manage difficulties with emotion regulation and reframing negative thought patterns that contribute to anxiety and worry. Jill Alexander has a diagnosis of Autism Spectrum Disorder (ASD). Symptoms feeling nervous or on edge, difficulty controlling worry, worrying about a variety of things, difficulty relaxing, irritability, fears for the future, feelings of hopelessness, difficulty sleeping, feeling tired, appetite irregularities, poor self-image, passive suicidal ideation without plan or intent (client contracted for safety during initial session) History of Problem  Jill Alexander reported that she moved from [place] with her family in July of 2018. Since that time, she has been seen by a provider in the area for challenges with emotion regulation and anxiety related to her diagnosis of autism spectrum disorder. Jill Alexander lives with her parents. She has a 23 year-old sister who has struggled with similar issues, as well as a brother who is three years older than she is and lives in [place]. Jill Alexander described a long history of therapy and therapeutic programs in order to help her manage these issues, including a partial hospitalization program in December of 2015.  Recent Trigger  Jill Alexander reported that she was seeking a change in pace with regard to her therapeutic treatment in order to ensure continued progress.  Marital and Family  Information  Present family concerns/problems: Jill Alexander shared several accounts of disputes with her mother, including her mother becoming upset with her for sharing worries with her immediately after her mother has woken up, despite her mother's requests that she not do so.  Strengths/resources in the family/friends: Jill Alexander described overall supportive relationships with her parents.  Marital/sexual history patterns: N/A  Family of Origin  Problems in family of origin: None reported.  Family background / ethnic factors: none reported.  No needs/concerns related to ethnicity reported when asked: No  Education/Vocation  Interpersonal concerns/problems: Jill Alexander reported that she has difficulties with social interactions related to her ASD diagnosis, and often perseverates about perceived missteps in both the immediate and distant past.  Personal strengths: Jill Alexander presented as motivated to improve her situation, and with some insight as to what is effective for her.  Military/work problems/concerns: None noted.  Leisure Activities/Daily Functioning  impaired functioning  Legal Status  No Legal Problems  Medical/Nutritional Concerns  unspecified  Comments: Jill Alexander reported that she often perseverates on perceived medical problems, and described herself as a hypochondriac  Religion/Spirituality  Not reported  Other  General Behavior: cooperative  Attire: appropriate  Gait: normal  Motor Activity: normal  Stream of Thought - Productivity: spontaneous  Stream of thought - Progression: normal  Stream of thought - Language: normal  Emotional tone and reactions - Affect: appropriate  Mental trend/Content of thoughts - Perception: normal  Mental trend/Content of thoughts - Orientation: normal  Mental trend/Content of thoughts - Memory: normal  Mental trend/Content of thoughts - General knowledge: consistent with education  Insight: good  Judgment: good  Intelligence: average  Diagnostic Summary  Autism Spectrum Disorder (F84.0)   [place] Counselor/Therapist Progress Note   Patient ID: Tsering Leaman, MRN: [id number],     Individualized Treatment Plan Strengths: Enjoys reading, Seeking counseling, In training program through [employer], "I like humor and jokes"  Supports: Her dog, her bestfriend since childhood, her parents, her sister    Goal/Needs for Treatment:  In order of importance to patient 1) Emotional Regulation/Anxiety 2) Improve Positive self talk      Client Statement of Needs: "Find ways of being nicer to myself", cope w/ anxiety and worry    Treatment Level:Outpatient Counseling  Symptoms:Anxiety, Perseverative worry, Negative self talk  Client Treatment Preferences:weekly counseling,  Prefers to be called Jill Alexander    Healthcare consumer's goal for treatment:  Dr. Laroy Apple will support the patient's ability to achieve the goals identified. Cognitive Behavioral Therapy, Supportive Counseling, Mindfulness and Relaxation Strategies and other evidenced-based practices will be used to promote progress towards healthy functioning.    Healthcare consumer will: Actively participate in therapy, working towards healthy functioning.     *Justification for Continuation/Discontinuation of Goal: R=Revised, O=Ongoing, A=Achieved, D=Discontinued   Goal 1) Jill Alexander will develop strategies to regulate her emotions and improve her positive self talk Likert rating baseline date 04/12/21: How Easy - 3; How Often - 50% Target Date Goal Was reviewed Status Code Progress towards goal/Likert rating  05/02/23   New Ongoing                      Jill Alexander participated in development of tx plan and provided verbal consent.         Myrtie Cruise, PhD               Myrtie Cruise, PhD

## 2022-05-22 ENCOUNTER — Other Ambulatory Visit (HOSPITAL_COMMUNITY): Payer: Self-pay

## 2022-05-27 ENCOUNTER — Other Ambulatory Visit (HOSPITAL_COMMUNITY): Payer: Self-pay

## 2022-05-28 ENCOUNTER — Other Ambulatory Visit: Payer: Self-pay

## 2022-05-28 ENCOUNTER — Telehealth (HOSPITAL_COMMUNITY): Payer: Self-pay | Admitting: *Deleted

## 2022-05-28 ENCOUNTER — Other Ambulatory Visit (HOSPITAL_COMMUNITY): Payer: Self-pay

## 2022-05-28 ENCOUNTER — Ambulatory Visit (INDEPENDENT_AMBULATORY_CARE_PROVIDER_SITE_OTHER): Payer: 59 | Admitting: Clinical

## 2022-05-28 DIAGNOSIS — F840 Autistic disorder: Secondary | ICD-10-CM

## 2022-05-28 NOTE — Progress Notes (Signed)
Time: 11:00 am-12:00 pm CPT Code: 10932T Diagnosis: F84.0  Jill Alexander was seen in person for therapy. She presented questioning why she does not feel motivation to take steps toward goals she has had for herself, such as writing a book. Therapist processed this with her, encouraging her to consider manageable action steps she may be able to take. Toward the end of session, she shared that she had experienced a deleterious effect of comparing herself to others on Facebook, and therapist discussed the idea of deactivating her facebook account. She also shared that she had attended a job interview. Therapist suggested starting the next session by creating an agenda to ensure that all important developments are discussed. She is scheduled to be seen again in one week.  Intake Presenting Problem Jill Alexander presented seeking CBT to help her manage difficulties with emotion regulation and reframing negative thought patterns that contribute to anxiety and worry. Jill Alexander has a diagnosis of Autism Spectrum Disorder (ASD). Symptoms feeling nervous or on edge, difficulty controlling worry, worrying about a variety of things, difficulty relaxing, irritability, fears for the future, feelings of hopelessness, difficulty sleeping, feeling tired, appetite irregularities, poor self-image, passive suicidal ideation without plan or intent (client contracted for safety during initial session) History of Problem  Jill Alexander reported that she moved from [place] with her family in July of 2018. Since that time, she has been seen by a provider in the area for challenges with emotion regulation and anxiety related to her diagnosis of autism spectrum disorder. Jill Alexander lives with her parents. She has a 29 year-old sister who has struggled with similar issues, as well as a brother who is three years older than she is and lives in [place]. Jill Alexander described a long history of therapy and therapeutic programs in order to help her manage these issues,  including a partial hospitalization program in December of 2015.  Recent Trigger  Jill Alexander reported that she was seeking a change in pace with regard to her therapeutic treatment in order to ensure continued progress.  Marital and Family Information  Present family concerns/problems: Jill Alexander shared several accounts of disputes with her mother, including her mother becoming upset with her for sharing worries with her immediately after her mother has woken up, despite her mother's requests that she not do so.  Strengths/resources in the family/friends: Jill Alexander described overall supportive relationships with her parents.  Marital/sexual history patterns: N/A  Family of Origin  Problems in family of origin: None reported.  Family background / ethnic factors: none reported.  No needs/concerns related to ethnicity reported when asked: No  Education/Vocation  Interpersonal concerns/problems: Jill Alexander reported that she has difficulties with social interactions related to her ASD diagnosis, and often perseverates about perceived missteps in both the immediate and distant past.  Personal strengths: Jill Alexander presented as motivated to improve her situation, and with some insight as to what is effective for her.  Military/work problems/concerns: None noted.  Leisure Activities/Daily Functioning  impaired functioning  Legal Status  No Legal Problems  Medical/Nutritional Concerns  unspecified  Comments: Jill Alexander reported that she often perseverates on perceived medical problems, and described herself as a hypochondriac  Religion/Spirituality  Not reported  Other  General Behavior: cooperative  Attire: appropriate  Gait: normal  Motor Activity: normal  Stream of Thought - Productivity: spontaneous  Stream of thought - Progression: normal  Stream of thought - Language: normal  Emotional tone and reactions - Affect: appropriate  Mental trend/Content of thoughts - Perception: normal  Mental trend/Content of thoughts  -  Orientation: normal  Mental trend/Content of thoughts - Memory: normal  Mental trend/Content of thoughts - General knowledge: consistent with education  Insight: good  Judgment: good  Intelligence: average  Diagnostic Summary  Autism Spectrum Disorder (F84.0)   [place] Counselor/Therapist Progress Note   Patient ID: Sieara Bremer, MRN: [id number],     Individualized Treatment Plan Strengths: Enjoys reading, Seeking counseling, In training program through [employer], "I like humor and jokes"  Supports: Her dog, her bestfriend since childhood, her parents, her sister    Goal/Needs for Treatment:  In order of importance to patient 1) Emotional Regulation/Anxiety 2) Improve Positive self talk      Client Statement of Needs: "Find ways of being nicer to myself", cope w/ anxiety and worry    Treatment Level:Outpatient Counseling  Symptoms:Anxiety, Perseverative worry, Negative self talk  Client Treatment Preferences:weekly counseling,  Prefers to be called Jill Alexander    Healthcare consumer's goal for treatment:  Dr. Laroy Apple will support the patient's ability to achieve the goals identified. Cognitive Behavioral Therapy, Supportive Counseling, Mindfulness and Relaxation Strategies and other evidenced-based practices will be used to promote progress towards healthy functioning.    Healthcare consumer will: Actively participate in therapy, working towards healthy functioning.     *Justification for Continuation/Discontinuation of Goal: R=Revised, O=Ongoing, A=Achieved, D=Discontinued   Goal 1) Jill Alexander will develop strategies to regulate her emotions and improve her positive self talk Likert rating baseline date 04/12/21: How Easy - 3; How Often - 50% Target Date Goal Was reviewed Status Code Progress towards goal/Likert rating  05/02/23   New Ongoing                      Jill Alexander participated in development of tx plan and provided verbal consent.     Myrtie Cruise, PhD               Myrtie Cruise, PhD

## 2022-05-28 NOTE — Telephone Encounter (Signed)
PA paperwork for Auvelity 45-100 mg TBCR signed by Dr. [name] and faxed to [facility] @ [phone]. Mother, Tressia Miners, advised.

## 2022-05-28 NOTE — Telephone Encounter (Signed)
Received fax from [place] that appeal for Auvelity 45-105 mg tabs has been received. Appeal sent for an expedited review and should have a decision no later than 72 hours.

## 2022-05-29 ENCOUNTER — Other Ambulatory Visit (HOSPITAL_COMMUNITY): Payer: Self-pay

## 2022-05-30 ENCOUNTER — Other Ambulatory Visit (HOSPITAL_COMMUNITY): Payer: Self-pay

## 2022-05-30 ENCOUNTER — Other Ambulatory Visit: Payer: Self-pay

## 2022-05-30 ENCOUNTER — Telehealth (HOSPITAL_COMMUNITY): Payer: Self-pay | Admitting: *Deleted

## 2022-05-30 NOTE — Telephone Encounter (Signed)
PA Appeal for [facility] 45-105 mg tablets has been denied. Despite the fact that this medication has been approved once and this is for a renewal. [employer] states that they don't have information showing that pt has responded to therapy with said medication. Please review and advise.

## 2022-05-31 ENCOUNTER — Telehealth (HOSPITAL_COMMUNITY): Payer: Self-pay | Admitting: Psychiatry

## 2022-05-31 ENCOUNTER — Other Ambulatory Visit (HOSPITAL_COMMUNITY): Payer: Self-pay

## 2022-05-31 NOTE — Telephone Encounter (Signed)
Thank you :)

## 2022-05-31 NOTE — Telephone Encounter (Signed)
I spoke with Jill Alexander on the phone regarding her insurance company denying coverage of Auvelity. Jill Alexander shares she has been taking it daily since it was prescribed and denies SE. She was able to get a 180 day supply. Jill Alexander has noticed that her depression has improved since starting Auvelity. The last time she was really depressed was about 1 month ago. At that time her level of depression was 7/10 (10 being the worst). She does not recall how long the depression episode lasted or other specifics. In the last 2 weeks she states she has only been depressed about 4-5 days. On those bad days her level of depression is 2-3/10 (10 being the worst). On those day she has some passive thoughts of death but denies SI/HI. The last time she had these thoughts was last week. On those days she has low motivation and lower energy than usual. She avoids going out and spends her time playing on her phone, listening to music and reading. She has noticed that her anhedonia is not severe or as frequent. She is sleeping about 10 hrs/night but still has low energy most days. Her appetite is variable. Her focus/concentration is a little better. Her self worth has improved and she is not getting down on herself as much. Overall Jill Alexander feels Auvelity has been helping and her parents have noticed too. They have told her she is brighter and her outlook is more positive in general.   Plan: continue meds Follow up scheduled 07/11/22 at 3:00pm

## 2022-06-03 ENCOUNTER — Other Ambulatory Visit (HOSPITAL_COMMUNITY): Payer: Self-pay

## 2022-06-03 NOTE — Telephone Encounter (Signed)
Thank you :)

## 2022-06-04 ENCOUNTER — Other Ambulatory Visit (HOSPITAL_COMMUNITY): Payer: Self-pay | Admitting: Psychiatry

## 2022-06-04 ENCOUNTER — Ambulatory Visit (INDEPENDENT_AMBULATORY_CARE_PROVIDER_SITE_OTHER): Payer: 59 | Admitting: Clinical

## 2022-06-04 ENCOUNTER — Other Ambulatory Visit (HOSPITAL_COMMUNITY): Payer: Self-pay

## 2022-06-04 ENCOUNTER — Telehealth (HOSPITAL_COMMUNITY): Payer: Self-pay | Admitting: *Deleted

## 2022-06-04 DIAGNOSIS — F840 Autistic disorder: Secondary | ICD-10-CM

## 2022-06-04 DIAGNOSIS — F332 Major depressive disorder, recurrent severe without psychotic features: Secondary | ICD-10-CM

## 2022-06-04 NOTE — Telephone Encounter (Signed)
Writer submitted via t/c PA to [employer] for [employer]. Awaiting determination which I was advised my take up to 72 hours. PA # [id number] and Ref # [id number].   PA appeal form also faxed again to [facility].   Writer spoke with pt's mother Tressia Miners to update.

## 2022-06-04 NOTE — Progress Notes (Signed)
Time: 11:00 am-12:00 pm CPT Code: 71062I Diagnosis: F84.0  Jill Alexander was seen in person for therapy. Session began by creating an agenda to help her prioritize and ensure that topics important to her would be covered. She expressed a desire to talk about her job interview and her pet cat. Jill Alexander reported that she had gotten the job she had interviewed for last week, with the plan to start Saturday. Therapist processed this with her, pointing out her success. Jill Alexander shared that her 29 year-old cat had been put to sleep that morning. Therapist encouraged her to consider how to memorialize her cat. She is scheduled to be seen again in one week.  Intake Presenting Problem Jill Alexander presented seeking CBT to help her manage difficulties with emotion regulation and reframing negative thought patterns that contribute to anxiety and worry. Jill Alexander has a diagnosis of Autism Spectrum Disorder (ASD). Symptoms feeling nervous or on edge, difficulty controlling worry, worrying about a variety of things, difficulty relaxing, irritability, fears for the future, feelings of hopelessness, difficulty sleeping, feeling tired, appetite irregularities, poor self-image, passive suicidal ideation without plan or intent (client contracted for safety during initial session) History of Problem  Jill Alexander reported that she moved from [place] with her family in July of 2018. Since that time, she has been seen by a provider in the area for challenges with emotion regulation and anxiety related to her diagnosis of autism spectrum disorder. Jill Alexander lives with her parents. She has a 26 year-old sister who has struggled with similar issues, as well as a brother who is three years older than she is and lives in [place]. Jill Alexander described a long history of therapy and therapeutic programs in order to help her manage these issues, including a partial hospitalization program in December of 2015.  Recent Trigger  Jill Alexander reported that she was seeking a change  in pace with regard to her therapeutic treatment in order to ensure continued progress.  Marital and Family Information  Present family concerns/problems: Jill Alexander shared several accounts of disputes with her mother, including her mother becoming upset with her for sharing worries with her immediately after her mother has woken up, despite her mother's requests that she not do so.  Strengths/resources in the family/friends: Jill Alexander described overall supportive relationships with her parents.  Marital/sexual history patterns: N/A  Family of Origin  Problems in family of origin: None reported.  Family background / ethnic factors: none reported.  No needs/concerns related to ethnicity reported when asked: No  Education/Vocation  Interpersonal concerns/problems: Jill Alexander reported that she has difficulties with social interactions related to her ASD diagnosis, and often perseverates about perceived missteps in both the immediate and distant past.  Personal strengths: Jill Alexander presented as motivated to improve her situation, and with some insight as to what is effective for her.  Military/work problems/concerns: None noted.  Leisure Activities/Daily Functioning  impaired functioning  Legal Status  No Legal Problems  Medical/Nutritional Concerns  unspecified  Comments: Jill Alexander reported that she often perseverates on perceived medical problems, and described herself as a hypochondriac  Religion/Spirituality  Not reported  Other  General Behavior: cooperative  Attire: appropriate  Gait: normal  Motor Activity: normal  Stream of Thought - Productivity: spontaneous  Stream of thought - Progression: normal  Stream of thought - Language: normal  Emotional tone and reactions - Affect: appropriate  Mental trend/Content of thoughts - Perception: normal  Mental trend/Content of thoughts - Orientation: normal  Mental trend/Content of thoughts - Memory: normal  Mental trend/Content of thoughts -  General knowledge:  consistent with education  Insight: good  Judgment: good  Intelligence: average  Diagnostic Summary  Autism Spectrum Disorder (F84.0)   [place] Counselor/Therapist Progress Note   Patient ID: Ayane Delancey, MRN: [id number],     Individualized Treatment Plan Strengths: Enjoys reading, Seeking counseling, In training program through [employer], "I like humor and jokes"  Supports: Her dog, her bestfriend since childhood, her parents, her sister    Goal/Needs for Treatment:  In order of importance to patient 1) Emotional Regulation/Anxiety 2) Improve Positive self talk      Client Statement of Needs: "Find ways of being nicer to myself", cope w/ anxiety and worry    Treatment Level:Outpatient Counseling  Symptoms:Anxiety, Perseverative worry, Negative self talk  Client Treatment Preferences:weekly counseling,  Prefers to be called Jill Alexander    Healthcare consumer's goal for treatment:  Dr. Laroy Apple will support the patient's ability to achieve the goals identified. Cognitive Behavioral Therapy, Supportive Counseling, Mindfulness and Relaxation Strategies and other evidenced-based practices will be used to promote progress towards healthy functioning.    Healthcare consumer will: Actively participate in therapy, working towards healthy functioning.     *Justification for Continuation/Discontinuation of Goal: R=Revised, O=Ongoing, A=Achieved, D=Discontinued   Goal 1) Jill Alexander will develop strategies to regulate her emotions and improve her positive self talk Likert rating baseline date 04/12/21: How Easy - 3; How Often - 50% Target Date Goal Was reviewed Status Code Progress towards goal/Likert rating  05/02/23   New Ongoing                      Jill Alexander participated in development of tx plan and provided verbal consent.        Myrtie Cruise, PhD               Myrtie Cruise, PhD

## 2022-06-11 ENCOUNTER — Ambulatory Visit: Payer: 59 | Admitting: Clinical

## 2022-06-11 DIAGNOSIS — F840 Autistic disorder: Secondary | ICD-10-CM

## 2022-06-11 NOTE — Progress Notes (Signed)
Time: 11:00 am-12:00 pm CPT Code: [id number] Diagnosis: F84.0  Jill Alexander was seen in person for therapy. Session began by creating an agenda to help her prioritize and ensure reported an increase in depressive symptoms, including loss of pleasure, excessive sleeping, and fatigue, for the past month. Passive suicidal ideation (e.g., "I wish I were hit by a car") was described, but plan and intent were denied. She has started attending [employer] again and plans to start her new job tomorrow. Therapist offered validation and support, providing psycheducation on depression using the metaphor of a cloud, and encouraged her to continue engaging in pleasant events even if she does not enjoy them in the same way, while releasing herself of the expectation to feel better than she does. She agreed to try this for homework. She is scheduled to be seen again in one week.  Intake Presenting Problem Jill Alexander presented seeking CBT to help her manage difficulties with emotion regulation and reframing negative thought patterns that contribute to anxiety and worry. Jill Alexander has a diagnosis of Autism Spectrum Disorder (ASD). Symptoms feeling nervous or on edge, difficulty controlling worry, worrying about a variety of things, difficulty relaxing, irritability, fears for the future, feelings of hopelessness, difficulty sleeping, feeling tired, appetite irregularities, poor self-image, passive suicidal ideation without plan or intent (client contracted for safety during initial session) History of Problem  Jill Alexander reported that she moved from [place] with her family in July of 2018. Since that time, she has been seen by a provider in the area for challenges with emotion regulation and anxiety related to her diagnosis of autism spectrum disorder. Jill Alexander lives with her parents. She has a 35 year-old sister who has struggled with similar issues, as well as a brother who is three years older than she is and lives in [place]. Jill Alexander described a long  history of therapy and therapeutic programs in order to help her manage these issues, including a partial hospitalization program in December of 2015.  Recent Trigger  Jill Alexander reported that she was seeking a change in pace with regard to her therapeutic treatment in order to ensure continued progress.  Marital and Family Information  Present family concerns/problems: Jill Alexander shared several accounts of disputes with her mother, including her mother becoming upset with her for sharing worries with her immediately after her mother has woken up, despite her mother's requests that she not do so.  Strengths/resources in the family/friends: Jill Alexander described overall supportive relationships with her parents.  Marital/sexual history patterns: N/A  Family of Origin  Problems in family of origin: None reported.  Family background / ethnic factors: none reported.  No needs/concerns related to ethnicity reported when asked: No  Education/Vocation  Interpersonal concerns/problems: Jill Alexander reported that she has difficulties with social interactions related to her ASD diagnosis, and often perseverates about perceived missteps in both the immediate and distant past.  Personal strengths: Jill Alexander presented as motivated to improve her situation, and with some insight as to what is effective for her.  Military/work problems/concerns: None noted.  Leisure Activities/Daily Functioning  impaired functioning  Legal Status  No Legal Problems  Medical/Nutritional Concerns  unspecified  Comments: Jill Alexander reported that she often perseverates on perceived medical problems, and described herself as a hypochondriac  Religion/Spirituality  Not reported  Other  General Behavior: cooperative  Attire: appropriate  Gait: normal  Motor Activity: normal  Stream of Thought - Productivity: spontaneous  Stream of thought - Progression: normal  Stream of thought - Language: normal  Emotional tone and reactions -  Affect: appropriate   Mental trend/Content of thoughts - Perception: normal  Mental trend/Content of thoughts - Orientation: normal  Mental trend/Content of thoughts - Memory: normal  Mental trend/Content of thoughts - General knowledge: consistent with education  Insight: good  Judgment: good  Intelligence: average  Diagnostic Summary  Autism Spectrum Disorder (F84.0)   [facility] Counselor/Therapist Progress Note   Patient ID: Darolyn Berthiaume, MRN: [id number],     Individualized Treatment Plan Strengths: Enjoys reading, Seeking counseling, In training program through [employer], "I like humor and jokes"  Supports: Her dog, her bestfriend since childhood, her parents, her sister    Goal/Needs for Treatment:  In order of importance to patient 1) Emotional Regulation/Anxiety 2) Improve Positive self talk      Client Statement of Needs: "Find ways of being nicer to myself", cope w/ anxiety and worry    Treatment Level:Outpatient Counseling  Symptoms:Anxiety, Perseverative worry, Negative self talk  Client Treatment Preferences:weekly counseling,  Prefers to be called Jill Alexander    Healthcare consumer's goal for treatment:  Dr. Laroy Apple will support the patient's ability to achieve the goals identified. Cognitive Behavioral Therapy, Supportive Counseling, Mindfulness and Relaxation Strategies and other evidenced-based practices will be used to promote progress towards healthy functioning.    Healthcare consumer will: Actively participate in therapy, working towards healthy functioning.     *Justification for Continuation/Discontinuation of Goal: R=Revised, O=Ongoing, A=Achieved, D=Discontinued   Goal 1) Jill Alexander will develop strategies to regulate her emotions and improve her positive self talk Likert rating baseline date 04/12/21: How Easy - 3; How Often - 50% Target Date Goal Was reviewed Status Code Progress towards goal/Likert rating  05/02/23   New Ongoing                       Jill Alexander participated in development of tx plan and provided verbal consent.          Myrtie Cruise, PhD               Myrtie Cruise, PhD

## 2022-06-12 NOTE — Telephone Encounter (Signed)
Medication management - Telephone call with Darrick Meigs, representative with [employer] to follow up on patient's second level appeal for her prescribed Auvelity 45-105 mg, 1 tablet two times a day, #60 for 30 days. Collateral reviewed the appeals record, with appeal reference number [id number].  Collateral stated he would have the provider review team pharmacy contact us back with a fax, most likely later this date with their second determination.  Collateral stated if denial for initial approval upheld, the letter with new determination would  have instructions for any further appeals.

## 2022-06-14 ENCOUNTER — Other Ambulatory Visit (HOSPITAL_COMMUNITY): Payer: Self-pay

## 2022-06-14 NOTE — Telephone Encounter (Signed)
I have not received anything from [facility]. Still awaiting determination.

## 2022-06-17 ENCOUNTER — Other Ambulatory Visit (HOSPITAL_COMMUNITY): Payer: Self-pay

## 2022-06-17 ENCOUNTER — Ambulatory Visit (INDEPENDENT_AMBULATORY_CARE_PROVIDER_SITE_OTHER): Payer: 59 | Admitting: Neurology

## 2022-06-17 ENCOUNTER — Encounter: Payer: Self-pay | Admitting: Neurology

## 2022-06-17 VITALS — BP 101/53 | HR 65 | Ht 62.0 in | Wt 128.6 lb

## 2022-06-17 DIAGNOSIS — F0630 Mood disorder due to known physiological condition, unspecified: Secondary | ICD-10-CM | POA: Diagnosis not present

## 2022-06-17 DIAGNOSIS — R569 Unspecified convulsions: Secondary | ICD-10-CM | POA: Diagnosis not present

## 2022-06-17 DIAGNOSIS — R4701 Aphasia: Secondary | ICD-10-CM | POA: Diagnosis not present

## 2022-06-17 DIAGNOSIS — G935 Compression of brain: Secondary | ICD-10-CM | POA: Diagnosis not present

## 2022-06-17 DIAGNOSIS — R5381 Other malaise: Secondary | ICD-10-CM

## 2022-06-17 DIAGNOSIS — R5382 Chronic fatigue, unspecified: Secondary | ICD-10-CM | POA: Diagnosis not present

## 2022-06-17 DIAGNOSIS — F840 Autistic disorder: Secondary | ICD-10-CM

## 2022-06-17 MED ORDER — ALPRAZOLAM 0.25 MG PO TABS
ORAL_TABLET | ORAL | 0 refills | Status: DC
  Filled 2022-06-17: qty 3, 1d supply, fill #0

## 2022-06-17 NOTE — Patient Instructions (Signed)
Attention Deficit Hyperactivity Disorder, Adult Attention deficit hyperactivity disorder (ADHD) is a mental health disorder that starts during childhood. For many people with ADHD, the disorder continues into the adult years. Treatment can help you manage your symptoms. There are three main types of ADHD: Inattentive. With this type, adults have difficulty paying attention. This may affect cognitive abilities. Hyperactive-impulsive. With this type, adults have a lot of energy and have difficulty controlling their behavior. Combination type. Some people may have symptoms of both types. What are the causes? The exact cause of ADHD is not known. Most experts believe a person's genes and environment possibly contribute to ADHD. What increases the risk? The following factors may make you more likely to develop this condition: Having a first-degree relative such as a parent, brother, or sister, with the condition. Being born before 37 weeks of pregnancy (prematurely) or at a low birth weight. Being born to a mother who smoked tobacco or drank alcohol during pregnancy. Having experienced a brain injury. Being exposed to lead or other toxins in the womb or early in life. What are the signs or symptoms? Symptoms of this condition depend on the type of ADHD. Symptoms of the inattentive type include: Difficulty paying attention or following instructions. Often making simple mistakes. Being disorganized. Avoiding tasks that require time and attention. Losing and forgetting things. Symptoms of the hyperactive-impulsive type include: Restlessness. Talking out of turn, interrupting others, or talking too much. Difficulty with: Sitting still. Feeling motivated. Relaxing. Waiting in line or waiting for a turn. People with the combination type have symptoms of both of the other types. In adults, this condition may lead to certain problems, such as: Keeping jobs. Performing tasks at work. Having  stable relationships. Being on time or keeping to a schedule. How is this diagnosed? This condition is diagnosed based on your current symptoms and your history of symptoms. The diagnosis can be made by a health care provider such as a primary care provider or a mental health care specialist. Your health care provider may use a symptom checklist or a behavior rating scale to evaluate your symptoms. Your health care provider may also want to talk with people who have observed your behaviors throughout your life. How is this treated? This condition can be treated with medicines and behavior therapy. Medicines may be the best option to reduce impulsive behaviors and improve attention. Your health care provider may recommend: Stimulant medicines. These are the most common medicines used for adult ADHD. They affect certain chemicals in the brain (neurotransmitters) and improve your ability to control your symptoms. A non-stimulant medicine. These medicines can also improve focus, attention, and impulsive behavior. It may take weeks to months to see the effects of this medicine. Counseling and behavioral management are also important for treating ADHD. Counseling is often used along with medicine. Your health care provider may suggest: Cognitive behavioral therapy (CBT). This type of therapy teaches you to replace negative thoughts and actions with positive thoughts and actions. When used as part of ADHD treatment, this therapy may also include: Coping strategies for organization, time management, impulse control, and stress reduction. Mindfulness and meditation training. Behavioral management. You may work with a coach who is specially trained to help people with ADHD manage and organize activities and function more effectively. Follow these instructions at home: Medicines  Take over-the-counter and prescription medicines only as told by your health care provider. Talk with your health care provider  about the possible side effects of your medicines and   how to manage them. Alcohol use Do not drink alcohol if: Your health care provider tells you not to drink. You are pregnant, may be pregnant, or are planning to become pregnant. If you drink alcohol: Limit how much you use to: 0-1 drink a day for women. 0-2 drinks a day for men. Know how much alcohol is in your drink. In the [place] one drink equals one 12 oz bottle of beer (355 mL), one 5 oz glass of wine (148 mL), or one 1 oz glass of hard liquor (44 mL). Lifestyle  Do not use illegal drugs. Get enough sleep. Eat a healthy diet. Exercise regularly. Exercise can help to reduce stress and anxiety. General instructions Learn as much as you can about adult ADHD, and work closely with your health care providers to find the treatments that work best for you. Follow the same schedule each day. Use reminder devices like notes, calendars, and phone apps to stay on time and organized. Keep all follow-up visits. Your health care provider will need to monitor your condition and adjust your treatment over time. Where to find more information A health care provider may be able to recommend resources that are available online or over the phone. You could start with: Attention Deficit Disorder Association (ADDA): add.org National Institute of Mental Health (NIMH): nimh.nih.gov Contact a health care provider if: Your symptoms continue to cause problems. You have side effects from your medicine, such as: Repeated muscle twitches, coughing, or speech outbursts. Sleep problems. Loss of appetite. Dizziness. Unusually fast heartbeat. Stomach pains. Headaches. You are struggling with anxiety, depression, or substance abuse. Get help right away if: You have a severe reaction to a medicine. This symptom may be an emergency. Get help right away. Call 911. Do not wait to see if the symptom will go away. Do not drive yourself to the hospital. Take  one of these steps if you feel like you may hurt yourself or others, or have thoughts about taking your own life: Go to your nearest emergency room. Call 911. Call the National Suicide Prevention Lifeline at 1-800-273-8255 or 988. This is open 24 hours a day Text the Crisis Text Line at 741741. Summary ADHD is a mental health disorder that starts during childhood and often continues into your adult years. The exact cause of ADHD is not known. Most experts believe genetics and environmental factors contribute to ADHD. There is no cure for ADHD, but treatment with medicine, cognitive behavioral therapy, or behavioral management can help you manage your condition. This information is not intended to replace advice given to you by your health care provider. Make sure you discuss any questions you have with your health care provider. Document Revised: 08/03/2021 Document Reviewed: 08/03/2021 Elsevier Patient Education  2023 Elsevier Inc.  

## 2022-06-17 NOTE — Progress Notes (Signed)
SLEEP MEDICINE CLINIC    Provider:  Larey Seat, MD  Primary Care Physician:  Marcellina Millin (Inactive) No address on file     Referring Provider:   Charlcie Cradle, [place],  [place] [id number]          Chief Complaint according to patient   Patient presents with:     New Patient (Initial Visit)  SLEEP CLINIC PATIENT      Formerly Dr Jannifer Franklin,   Awaiting a separate work up for communication problems- increasing word finding /aphasic  spells over the last 8 months, autism spectrum disorder.       HISTORY OF PRESENT ILLNESS:  Jill Alexander is a 27 y.o. year old autistic  Caucasian female patient seen on 06/17/2022 for a Revisit -  she underwent a sleep study in early January and this study was interpreted by my colleague Dr Rexene Alberts.   The study showed no significant apnea nor hypoxia. And the fragmented sleep was typical for autism.  She remains fatigued and hypersomnic. Epworth 16/ 18 points ( modified for non driver , non drinker)   The patient is a designated Sleep Clinic Patient for me but seen today for Memory and communication .  She had neuropsychological testing  in her High School years.  \Was diagnosed with low executive functioning.  Last testing in 2013 .  Chief concern according to patient :    Deteriorating communication skills.  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  The words just disappear from her mental screen.  MMSE here 26/ 30 points.     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    Sleep relevant medical history: " Nocturia 1-2, no Tonsillectomy, no ENT problems, low  hearing loss,"   Family medical /sleep history: No other family member on CPAP with OSA, insomnia, sleep walkers.    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 Pets are present. One dog and one cat. Tobacco use-none.  ETOH use ; none, Caffeine intake in form of Coffee( 2 cups in AM ) Soda( /) Tea ( /) or energy drinks. Regular exercise in form of walking.   Hobbies : reading.     Sleep habits are as follows: The patient's dinner time is between 6.30 PM. The patient goes to bed at 9-11 PM and continues to sleep for 4-8 hours, wakes for 1-2 bathroom breaks. The bedroom is cool, quiet and dark.    The preferred sleep position is supine , with the support of 2-3 pillows.  Dreams are reportedly frequent , feeling real but not too vivid. She feels the best sleep is in the morning hours.    8-9 AM is the usual rise time. The patient wakes up at various times- spontaneously at 9 AM and she sleeps through alarms.  She reports not feeling refreshed or restored in AM, with symptoms such as dry mouth, morning headaches, and residual fatigue.  Naps are taken frequently, lasting from 60 to 180 minutes and are not refreshing .    Review of Systems: Out of a complete 14  system review, the patient complains of only the following symptoms, and all other reviewed systems are negative.:   Depression, anxiety, Autism.  Fatigue, sleepiness , snoring, fragmented sleep,    How likely are you to doze in the following situations: 0 = not likely, 1 = slight chance, 2 = moderate chance, 3 = high chance   Sitting and Reading? Watching Television? Sitting inactive in a public place (theater or meeting)? As a passenger in a car for an hour without a break? Lying down in the afternoon when circumstances permit? Sitting and talking to someone? Sitting quietly after lunch without alcohol? In a car, while stopped for a few  minutes in traffic?   Total =  16 / 24 points   FSS endorsed at 51/ 63 points.   Depression is actively treated, therapist and medication. .   Social History   Socioeconomic History   Marital status: Single    Spouse name: Not on file   Number of children: 0   Years of education: Not on file   Highest education level: Some college, no degree  Occupational History   Not on file  Tobacco Use   Smoking status: Never   Smokeless tobacco: Never  Vaping Use   Vaping Use: Never used  Substance and Sexual Activity   Alcohol use: Yes    Comment: 1 cocktail a few times a year   Drug use: No   Sexual activity: Not Currently    Birth control/protection: Pill  Other Topics Concern   Not on file  Social History Narrative   Lives with parents in [place] with her mom and dad. Pt moved here from [place] in 2018.      Siblings- Pt is the youngest of out 3 siblings. 68 sister 21 years older than her, 1 brother who is 66 years older than her.    Schooling- started college right after HS graduation and stopped after a year   Married- denies    Kids- denies    Legal issues- denies       Caffeine use: 2-3 cups per day   Left handed    Social Determinants of Health   Financial Resource Strain: Low Risk  (05/27/2018)   Overall Financial Resource Strain (CARDIA)    Difficulty of Paying Living Expenses: Not hard at all  Food Insecurity: No Food Insecurity (05/27/2018)   Hunger Vital Sign    Worried About Running Out of Food in the Last Year: Never true    Ran Out of Food in the Last Year: Never true  Transportation Needs: Unmet Transportation Needs (06/29/2018)   PRAPARE - Transportation    Lack of Transportation (Medical): Yes    Lack of Transportation (Non-Medical): Yes  Physical Activity: Unknown (06/29/2018)   Exercise Vital Sign    Days of Exercise per Week: 3 days    Minutes of Exercise per Session: Not on file  Recent Concern: Physical Activity - Insufficiently Active (05/27/2018)    Exercise Vital Sign    Days of Exercise per Week: 2 days    Minutes of Exercise per Session: 30 min  Stress: Stress Concern Present (06/29/2018)   [place]    Feeling of Stress : Rather much  Social Connections: Unknown (06/29/2018)   Social Connection and Isolation Panel [NHANES]    Frequency of Communication with Friends and Family: Not on file    Frequency of Social Gatherings with Friends and Family: Not on file  Attends Religious Services: Not on file    Active Member of Clubs or Organizations: Not on file    Attends Archivist Meetings: Not on file    Marital Status: Never married    Family History  Problem Relation Age of Onset   Depression Father    Depression Sister    Anxiety disorder Sister    Diabetes Maternal Grandfather    Cancer Maternal Grandfather    Breast cancer Paternal Grandmother    Uterine cancer Paternal Grandmother    Irritable bowel syndrome Paternal Grandmother    Colon cancer Neg Hx    Esophageal cancer Neg Hx    Stomach cancer Neg Hx    Rectal cancer Neg Hx     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    Past Surgical History:  Procedure Laterality Date   none       Current Outpatient Medications on File Prior to Visit  Medication Sig Dispense Refill   acetaminophen (TYLENOL) 500 MG tablet Take 500 mg by mouth every 6 (six) hours as needed.     Dextromethorphan-buPROPion ER (AUVELITY) 45-105 MG TBCR Take 1 tablet by mouth 2 (two) times daily. 180 tablet 0   Docusate Sodium (COLACE PO) Take by mouth.     drospirenone-ethinyl estradiol (YAZ) 3-0.02 MG tablet Take 1 tablet by mouth daily. 28 tablet 11   Erenumab-aooe (AIMOVIG)  140 MG/ML SOAJ Inject 140 mg into the skin every 30 (thirty) days. (Patient not taking: Reported on 05/09/2022) 1 mL 11   Famotidine (PEPCID PO) Take by mouth.     Ibuprofen (MOTRIN PO) Take 200 mg by mouth as needed.     lamoTRIgine (LAMICTAL) 200 MG tablet Take 1 tablet (200 mg total) by mouth 2 (two) times daily for seizures and depression. 60 tablet 2   LORazepam (ATIVAN) 1 MG tablet Take 1 tablet (1 mg total) by mouth 2 (two) times daily as needed for anxiety. 60 tablet 0   lurasidone (LATUDA) 40 MG TABS tablet Take 1 tablet (40 mg total) by mouth daily with breakfast. 90 tablet 0   melatonin 1 MG TABS tablet Take 1 tablet (1 mg total) by mouth at bedtime as needed (insomnia). 30 tablet 0   mirabegron ER (MYRBETRIQ) 50 MG TB24 tablet Take 1 tablet (50 mg total) by mouth daily. 30 tablet 3   Multiple Vitamins-Minerals (MULTIVITAMIN PO) Take 1 tablet by mouth daily.     omeprazole (PRILOSEC) 20 MG capsule Take 1 capsule (20 mg total) by mouth daily. (Patient not taking: Reported on 05/09/2022) 30 capsule 5   polyethylene glycol (MIRALAX) packet Take 17 g by mouth daily as needed. 14 each 0   rizatriptan (MAXALT) 10 MG tablet Take 1 tablet (10 mg total) by mouth as needed for migraine. May repeat in 2 hours if needed. 10 tablet 11   topiramate (TOPAMAX) 50 MG tablet Take 3 tablets (150 mg total) by mouth at bedtime. 270 tablet 0   [DISCONTINUED] Norethin Ace-Eth Estrad-FE (TAYTULLA) 1-20 MG-MCG(24) CAPS TAKE 1 CAPSULE BY MOUTH DAILY. 28 capsule 11   No current facility-administered medications on file prior to visit.    Allergies  Allergen Reactions  Gluten Meal     Physical exam:  Today's Vitals   06/17/22 1408  BP: (!) 101/53  Pulse: 65  Weight: 128 lb 9.6 oz (58.3 kg)  Height: 5' 2"$  (1.575 m)   Body mass index is 23.52 kg/m.   Wt Readings from Last 3 Encounters:  06/17/22 128 lb 9.6 oz (58.3 kg)  03/18/22 124 lb (56.2 kg)  12/25/21 124 lb (56.2 kg)     Ht Readings from  Last 3 Encounters:  06/17/22 5' 2"$  (1.575 m)  03/18/22 5' 2"$  (1.575 m)  12/25/21 5' 2"$  (1.575 m)      General: The patient is awake, alert and appears not in acute distress. The patient is well groomed. Head: Normocephalic, atraumatic. Neck is supple.  Mallampati  1,  neck circumference:13 inches . Nasal airflow patent.   Retrognathia is present- mild .  Dental status: biological , no braces.  Cardiovascular:  Regular rate and cardiac rhythm by pulse,  without distended neck veins. Respiratory: Lungs are clear to auscultation.  Skin:  Without evidence of ankle edema, or rash. Trunk: The patient's posture is not erect.    Neurologic exam : The patient is awake and alert, oriented to place and time.  avoiding eye contact.   Memory subjective described as impaired, thoughts breaking off.   Attention span & concentration ability appears restricted  Speech is fluent Mood and affect are anxious.  Racing thoughts, sometimes insomnia.    Cranial nerves: no loss of smell or taste reported  Pupils are equal and briskly reactive to light. Funduscopic exam deferred. She doesn't make eye contact.  Extraocular movements in vertical and horizontal planes were intact and without nystagmus. No Diplopia. Visual fields by finger perimetry are intact. Hearing was intact to soft voice and finger rubbing.    Facial sensation intact to fine touch.  Facial motor strength is symmetric /tongue and uvula move midline.  Neck ROM : rotation, tilt and flexion extension were normal for age and shoulder shrug was symmetrical.    Motor exam:  Symmetric bulk, tone and ROM.   Normal tone without cog wheeling, symmetric grip strength .   Sensory:  Fine touch, pinprick and vibration were tested  and  normal.  Proprioception tested in the upper extremities was normal.   Coordination: Rapid alternating movements in the fingers/hands were of normal speed.  The Finger-to-nose maneuver was intact without evidence of  ataxia, dysmetria or tremor.   Gait and station: Patient could rise unassisted from a seated position, walked without assistive device.  Stance is of normal width/ base, gait is robotic- abrupt-  and the patient turned with 3 steps.  Toe and heel walk were deferred.  Deep tendon reflexes: in the  upper and lower extremities are symmetric and intact.  Babinski response was deferred .       After spending a total time of  35  minutes face to face and additional time for physical and neurologic examination, review of laboratory studies,  personal review of imaging studies, reports and results of other testing and review of referral information / records as far as provided in visit, I have established the following assessments:   Autism/ mental health )  1) negative sleep study-  This  patient with autism  presents today with excessive daytime sleepiness and a high degree of fatigue, she has neither tested positive for apnea nor hypoxia, not for PLMs.  1b) essential tremor - medication induced.    2) fatigue score is  high and there is some malaise.  The patient is currently treated with medication for depression as well and is undergoing therapy on a regular basis.   3) memory struggle, sentences breaking off, thoughts breaking off.  Mother reports a history of seizures in young age - can this be manifestation of a seizure disorder?  I will order EEG and MRI brain, just to rule out any development in that area.   4)I refer to a new neuropsychological testing battery , [facility].     My Plan is to proceed with:  1)  EEG for seizures in childhood, ( arnold Chiari Malformation)  Brain MRI, xanax  ordered for anxiety-   I refer to a new neuropsychological testing battery , [facility].       CC:  Irene Pap, PA-C (Inactive)   In short, Larsyn Losa is presenting with witnessed Aphasia, fleeing thoughts , memory struggles-   in autism-   This is meant to rule out an  organic disorder aside from autism to cause thoughts breaking off  and word finding problems.  Rule out abnormal MRI, EEG, and neuropsychic memory test  .   I plan to follow up either personally prn or through our NP within 6 months.   Attention testing needs to be provided through Dr [name], MD    Electronically signed by: Larey Seat, MD 06/17/2022 2:29 PM  [facility] Board certified by The [employer] of Sleep Medicine and Diplomate of the [employer] of Sleep Medicine. Board certified In Neurology through the [place], Fellow of the [employer] of Neurology. Medical Director of [facility].

## 2022-06-18 ENCOUNTER — Telehealth: Payer: Self-pay | Admitting: Neurology

## 2022-06-18 ENCOUNTER — Ambulatory Visit (INDEPENDENT_AMBULATORY_CARE_PROVIDER_SITE_OTHER): Payer: 59 | Admitting: Clinical

## 2022-06-18 DIAGNOSIS — F840 Autistic disorder: Secondary | ICD-10-CM | POA: Diagnosis not present

## 2022-06-18 NOTE — Progress Notes (Signed)
Time: 11:00 am-12:00 pm CPT Code: [id number] Diagnosis: F84.0  Jill Alexander was seen in person for therapy. Session began by processing a secure message sent by her mother regarding a text sent the night before. Jill Alexander shared the text with the therapist, in which she said she just wanted to "drown myself." Therapist processed this with her and conducted a risk assessment. Jill Alexander endorsed suicidal ideation without plan or intent. Therapist worked with her to create a coping plan for the evenings when she is feeling down. She agreed to follow this plan every night in the coming week. She is scheduled to be seen again in one week.  Intake Presenting Problem Jill Alexander presented seeking CBT to help her manage difficulties with emotion regulation and reframing negative thought patterns that contribute to anxiety and worry. Jill Alexander has a diagnosis of Autism Spectrum Disorder (ASD). Symptoms feeling nervous or on edge, difficulty controlling worry, worrying about a variety of things, difficulty relaxing, irritability, fears for the future, feelings of hopelessness, difficulty sleeping, feeling tired, appetite irregularities, poor self-image, passive suicidal ideation without plan or intent (client contracted for safety during initial session) History of Problem  Jill Alexander reported that she moved from [place] with her family in July of 2018. Since that time, she has been seen by a provider in the area for challenges with emotion regulation and anxiety related to her diagnosis of autism spectrum disorder. Jill Alexander lives with her parents. She has a 12 year-old sister who has struggled with similar issues, as well as a brother who is three years older than she is and lives in [place]. Jill Alexander described a long history of therapy and therapeutic programs in order to help her manage these issues, including a partial hospitalization program in December of 2015.  Recent Trigger  Jill Alexander reported that she was seeking a change in pace with regard to  her therapeutic treatment in order to ensure continued progress.  Marital and Family Information  Present family concerns/problems: Jill Alexander shared several accounts of disputes with her mother, including her mother becoming upset with her for sharing worries with her immediately after her mother has woken up, despite her mother's requests that she not do so.  Strengths/resources in the family/friends: Jill Alexander described overall supportive relationships with her parents.  Marital/sexual history patterns: N/A  Family of Origin  Problems in family of origin: None reported.  Family background / ethnic factors: none reported.  No needs/concerns related to ethnicity reported when asked: No  Education/Vocation  Interpersonal concerns/problems: Jill Alexander reported that she has difficulties with social interactions related to her ASD diagnosis, and often perseverates about perceived missteps in both the immediate and distant past.  Personal strengths: Jill Alexander presented as motivated to improve her situation, and with some insight as to what is effective for her.  Military/work problems/concerns: None noted.  Leisure Activities/Daily Functioning  impaired functioning  Legal Status  No Legal Problems  Medical/Nutritional Concerns  unspecified  Comments: Jill Alexander reported that she often perseverates on perceived medical problems, and described herself as a hypochondriac  Religion/Spirituality  Not reported  Other  General Behavior: cooperative  Attire: appropriate  Gait: normal  Motor Activity: normal  Stream of Thought - Productivity: spontaneous  Stream of thought - Progression: normal  Stream of thought - Language: normal  Emotional tone and reactions - Affect: appropriate  Mental trend/Content of thoughts - Perception: normal  Mental trend/Content of thoughts - Orientation: normal  Mental trend/Content of thoughts - Memory: normal  Mental trend/Content of thoughts - General knowledge: consistent with  education  Insight: good  Judgment: good  Intelligence: average  Diagnostic Summary  Autism Spectrum Disorder (F84.0)   [facility] Counselor/Therapist Progress Note   Patient ID: Nelsie Berke, MRN: [id number],     Individualized Treatment Plan Strengths: Enjoys reading, Seeking counseling, In training program through [employer], "I like humor and jokes"  Supports: Her dog, her bestfriend since childhood, her parents, her sister    Goal/Needs for Treatment:  In order of importance to patient 1) Emotional Regulation/Anxiety 2) Improve Positive self talk      Client Statement of Needs: "Find ways of being nicer to myself", cope w/ anxiety and worry    Treatment Level:Outpatient Counseling  Symptoms:Anxiety, Perseverative worry, Negative self talk  Client Treatment Preferences:weekly counseling,  Prefers to be called Jill Alexander    Healthcare consumer's goal for treatment:  Dr. Laroy Apple will support the patient's ability to achieve the goals identified. Cognitive Behavioral Therapy, Supportive Counseling, Mindfulness and Relaxation Strategies and other evidenced-based practices will be used to promote progress towards healthy functioning.    Healthcare consumer will: Actively participate in therapy, working towards healthy functioning.     *Justification for Continuation/Discontinuation of Goal: R=Revised, O=Ongoing, A=Achieved, D=Discontinued   Goal 1) Jill Alexander will develop strategies to regulate her emotions and improve her positive self talk Likert rating baseline date 04/12/21: How Easy - 3; How Often - 50% Target Date Goal Was reviewed Status Code Progress towards goal/Likert rating  05/02/23   New Ongoing                      Jill Alexander participated in development of tx plan and provided verbal consent.    Myrtie Cruise, PhD               Myrtie Cruise, PhD

## 2022-06-18 NOTE — Telephone Encounter (Signed)
Sent to [facility] they obtain Jill Alexander, [place] medicaid [place] [phone]

## 2022-06-19 ENCOUNTER — Other Ambulatory Visit (HOSPITAL_COMMUNITY): Payer: Self-pay | Admitting: Psychiatry

## 2022-06-19 DIAGNOSIS — F411 Generalized anxiety disorder: Secondary | ICD-10-CM

## 2022-06-19 DIAGNOSIS — F410 Panic disorder [episodic paroxysmal anxiety] without agoraphobia: Secondary | ICD-10-CM

## 2022-06-20 ENCOUNTER — Other Ambulatory Visit (HOSPITAL_COMMUNITY): Payer: Self-pay

## 2022-06-20 MED ORDER — LORAZEPAM 1 MG PO TABS
1.0000 mg | ORAL_TABLET | Freq: Two times a day (BID) | ORAL | 0 refills | Status: DC | PRN
  Filled 2022-06-20 – 2022-07-09 (×3): qty 60, 30d supply, fill #0

## 2022-06-21 ENCOUNTER — Other Ambulatory Visit: Payer: Self-pay

## 2022-06-22 ENCOUNTER — Other Ambulatory Visit: Payer: Self-pay | Admitting: Neurology

## 2022-06-25 ENCOUNTER — Ambulatory Visit (INDEPENDENT_AMBULATORY_CARE_PROVIDER_SITE_OTHER): Payer: 59 | Admitting: Clinical

## 2022-06-25 ENCOUNTER — Other Ambulatory Visit: Payer: Self-pay

## 2022-06-25 DIAGNOSIS — F840 Autistic disorder: Secondary | ICD-10-CM

## 2022-06-25 NOTE — Progress Notes (Signed)
Time: 11:00 am-12:00 pm CPT Code: [id number] Diagnosis: F84.0  Jill Alexander was seen in person for therapy. Session began by setting an agenda, and Jill Alexander indicated that she wanted to talk about a conversation she had had with her parents, as well as her mood. She reflected upon information she had learned about her family. Therapist provided an opportunity to process. She reported that her mood has been low, and attributed this to participation in the [facility] [employer] [employer] while starting her new job. However, she expressed optimism that she may enjoy her job once she feels less overwhelmed. She is scheduled to be seen again in one week.  Intake Presenting Problem Jill Alexander presented seeking CBT to help her manage difficulties with emotion regulation and reframing negative thought patterns that contribute to anxiety and worry. Jill Alexander has a diagnosis of Autism Spectrum Disorder (ASD). Symptoms feeling nervous or on edge, difficulty controlling worry, worrying about a variety of things, difficulty relaxing, irritability, fears for the future, feelings of hopelessness, difficulty sleeping, feeling tired, appetite irregularities, poor self-image, passive suicidal ideation without plan or intent (client contracted for safety during initial session) History of Problem  Jill Alexander reported that she moved from [place] with her family in July of 2018. Since that time, she has been seen by a provider in the area for challenges with emotion regulation and anxiety related to her diagnosis of autism spectrum disorder. Jill Alexander lives with her parents. She has a 68 year-old sister who has struggled with similar issues, as well as a brother who is three years older than she is and lives in [place]. Jill Alexander described a long history of therapy and therapeutic programs in order to help her manage these issues, including a partial hospitalization program in December of 2015.  Recent Trigger  Jill Alexander reported that she was seeking a change in pace with  regard to her therapeutic treatment in order to ensure continued progress.  Marital and Family Information  Present family concerns/problems: Jill Alexander shared several accounts of disputes with her mother, including her mother becoming upset with her for sharing worries with her immediately after her mother has woken up, despite her mother's requests that she not do so.  Strengths/resources in the family/friends: Jill Alexander described overall supportive relationships with her parents.  Marital/sexual history patterns: N/A  Family of Origin  Problems in family of origin: None reported.  Family background / ethnic factors: none reported.  No needs/concerns related to ethnicity reported when asked: No  Education/Vocation  Interpersonal concerns/problems: Jill Alexander reported that she has difficulties with social interactions related to her ASD diagnosis, and often perseverates about perceived missteps in both the immediate and distant past.  Personal strengths: Jill Alexander presented as motivated to improve her situation, and with some insight as to what is effective for her.  Military/work problems/concerns: None noted.  Leisure Activities/Daily Functioning  impaired functioning  Legal Status  No Legal Problems  Medical/Nutritional Concerns  unspecified  Comments: Jill Alexander reported that she often perseverates on perceived medical problems, and described herself as a hypochondriac  Religion/Spirituality  Not reported  Other  General Behavior: cooperative  Attire: appropriate  Gait: normal  Motor Activity: normal  Stream of Thought - Productivity: spontaneous  Stream of thought - Progression: normal  Stream of thought - Language: normal  Emotional tone and reactions - Affect: appropriate  Mental trend/Content of thoughts - Perception: normal  Mental trend/Content of thoughts - Orientation: normal  Mental trend/Content of thoughts - Memory: normal  Mental trend/Content of thoughts - General knowledge: consistent  with  education  Insight: good  Judgment: good  Intelligence: average  Diagnostic Summary  Autism Spectrum Disorder (F84.0)   [place] Counselor/Therapist Progress Note   Patient ID: Zeyda Hamed, MRN: [id number],     Individualized Treatment Plan Strengths: Enjoys reading, Seeking counseling, In training program through [employer], "I like humor and jokes"  Supports: Her dog, her bestfriend since childhood, her parents, her sister    Goal/Needs for Treatment:  In order of importance to patient 1) Emotional Regulation/Anxiety 2) Improve Positive self talk      Client Statement of Needs: "Find ways of being nicer to myself", cope w/ anxiety and worry    Treatment Level:Outpatient Counseling  Symptoms:Anxiety, Perseverative worry, Negative self talk  Client Treatment Preferences:weekly counseling,  Prefers to be called Jill Alexander    Healthcare consumer's goal for treatment:  Dr. Laroy Apple will support the patient's ability to achieve the goals identified. Cognitive Behavioral Therapy, Supportive Counseling, Mindfulness and Relaxation Strategies and other evidenced-based practices will be used to promote progress towards healthy functioning.    Healthcare consumer will: Actively participate in therapy, working towards healthy functioning.     *Justification for Continuation/Discontinuation of Goal: R=Revised, O=Ongoing, A=Achieved, D=Discontinued   Goal 1) Jill Alexander will develop strategies to regulate her emotions and improve her positive self talk Likert rating baseline date 04/12/21: How Easy - 3; How Often - 50% Target Date Goal Was reviewed Status Code Progress towards goal/Likert rating  05/02/23   New Ongoing                      Jill Alexander participated in development of tx plan and provided verbal consent.    Myrtie Cruise, PhD               Myrtie Cruise, PhD

## 2022-06-26 ENCOUNTER — Other Ambulatory Visit (HOSPITAL_COMMUNITY): Payer: Self-pay

## 2022-06-26 MED ORDER — LAMOTRIGINE 200 MG PO TABS
200.0000 mg | ORAL_TABLET | Freq: Two times a day (BID) | ORAL | 2 refills | Status: DC
  Filled 2022-06-26: qty 60, 30d supply, fill #0
  Filled 2022-07-20: qty 60, 30d supply, fill #1
  Filled 2022-08-23: qty 60, 30d supply, fill #2

## 2022-06-27 ENCOUNTER — Telehealth (HOSPITAL_COMMUNITY): Payer: Self-pay

## 2022-06-27 ENCOUNTER — Ambulatory Visit (INDEPENDENT_AMBULATORY_CARE_PROVIDER_SITE_OTHER): Payer: 59 | Admitting: Neurology

## 2022-06-27 DIAGNOSIS — F840 Autistic disorder: Secondary | ICD-10-CM

## 2022-06-27 DIAGNOSIS — G935 Compression of brain: Secondary | ICD-10-CM

## 2022-06-27 DIAGNOSIS — R5381 Other malaise: Secondary | ICD-10-CM

## 2022-06-27 DIAGNOSIS — F0630 Mood disorder due to known physiological condition, unspecified: Secondary | ICD-10-CM

## 2022-06-27 DIAGNOSIS — R5382 Chronic fatigue, unspecified: Secondary | ICD-10-CM

## 2022-06-27 DIAGNOSIS — R569 Unspecified convulsions: Secondary | ICD-10-CM | POA: Diagnosis not present

## 2022-06-27 DIAGNOSIS — R4701 Aphasia: Secondary | ICD-10-CM

## 2022-06-28 ENCOUNTER — Telehealth (HOSPITAL_COMMUNITY): Payer: Self-pay | Admitting: *Deleted

## 2022-06-28 NOTE — Telephone Encounter (Signed)
Writer called [employer] to f/u on Level 2 Appeal denial for the [facility] ER 45-105 mg tablet. Writer spoke with PA pharmacy rep, Kalman Shan, who informed that pt's coverage ended on 06/27/22. Writer then called [place] [facility] again to find out about PA's submitted and Appeal. Writer was informed that the [facility] ER 45-105 mg tabs has been APPROVED from 06/04/22 through 05/29/22. LVM advising mother of approval.

## 2022-06-29 ENCOUNTER — Other Ambulatory Visit (HOSPITAL_COMMUNITY): Payer: Self-pay

## 2022-06-30 ENCOUNTER — Other Ambulatory Visit (HOSPITAL_COMMUNITY): Payer: Self-pay

## 2022-07-01 ENCOUNTER — Other Ambulatory Visit (HOSPITAL_COMMUNITY): Payer: Self-pay

## 2022-07-01 ENCOUNTER — Other Ambulatory Visit: Payer: Self-pay

## 2022-07-04 ENCOUNTER — Telehealth (HOSPITAL_COMMUNITY): Payer: Medicaid Other | Admitting: Psychiatry

## 2022-07-08 ENCOUNTER — Encounter: Payer: Self-pay | Admitting: Neurology

## 2022-07-08 NOTE — Procedures (Signed)
[facility]  EEG (ELECTROENCEPHALOGRAM) REPORT   ORDERING CLINICIAN: Larey Seat, M.D.  TECHNOLOGIST: Wyman Songster, REEGT TECHNIQUE:  This Electroencephalogram was recorded utilizing the international standard 10-20 system of lead placement and reformatted into average and bipolar montages.  A single ECG electrode is placed to detect heart rate and rhythm. video and audio can not be recorded.   Recoding duration : 26.07 Activation included:  yes: Photic stimulation yes: Hyperventilation .      Description: The EEG's posterior dominant background rhythm of 8 hertz was symmetrically displayed while the patient's eyes were closed , and promptly attenuated with eye opening. At baseline, the recording had a moderate amplitude ,symmetrically displayed.  Photic stimulation was initiated at frequencies from 3- through 21 hertz, resulting in photic entrainment at  7 through 13 hz., all without periodic or rhythmic discharges, or epileptiform activity.  Hyperventilation maneuver was initiated, leading to amplitude build-up, and leading to slowing to 6.5 hertz.  Following hyperventilation, the patient's EEG was reviewed for a period of 1 and 2 minutes post maneuver, and indicated symmetric activity at 7 hertz EKG- NSR 78-86 bpm.   The patient was recorded while awake, while drowsy.   IMPRESSION:  This EEG is only abnormal due to the findings of diffuse slowing manifesting after HV maneuver. This can be a sign of medication related slowing, of en- cephalopathy as seen in some autism spectrum disorders .   No epileptiform activity was noted. The patient was cooperative.       Dr. Larey Seat, M.D. Accredited by the [facility], [place].

## 2022-07-08 NOTE — Progress Notes (Signed)
This was a borderline slow EEG to begin with and slowing  was further exacerbated with and after HV maneuver. No epileptiform activity was noted.    PCP ?

## 2022-07-09 ENCOUNTER — Other Ambulatory Visit (HOSPITAL_COMMUNITY): Payer: Self-pay

## 2022-07-09 ENCOUNTER — Ambulatory Visit (INDEPENDENT_AMBULATORY_CARE_PROVIDER_SITE_OTHER): Payer: 59 | Admitting: Clinical

## 2022-07-09 DIAGNOSIS — F840 Autistic disorder: Secondary | ICD-10-CM

## 2022-07-09 NOTE — Progress Notes (Signed)
Time: 11:00 am-12:00 pm CPT Code: [id number] Diagnosis: F84.0  Jill Alexander was seen in person for therapy. Session began by setting an agenda. Jill Alexander indicated that she would like to talk about her transition to her new job, as well as an increased tendency toward social withdrawal at work and [employer]. Therapist pointed out cognitive distortions (black and white thinking, over generalizing). For homework, she will try not being around people when she does not feel social (for example, not arriving at work 30 minutes early when she does not want to talk to anyone), and pushing herself to smile and say hi to at least one person per day. She is scheduled to be seen again in one week.  Intake Presenting Problem Jill Alexander presented seeking CBT to help her manage difficulties with emotion regulation and reframing negative thought patterns that contribute to anxiety and worry. Jill Alexander has a diagnosis of Autism Spectrum Disorder (ASD). Symptoms feeling nervous or on edge, difficulty controlling worry, worrying about a variety of things, difficulty relaxing, irritability, fears for the future, feelings of hopelessness, difficulty sleeping, feeling tired, appetite irregularities, poor self-image, passive suicidal ideation without plan or intent (client contracted for safety during initial session) History of Problem  Jill Alexander reported that she moved from [place] with her family in July of 2018. Since that time, she has been seen by a provider in the area for challenges with emotion regulation and anxiety related to her diagnosis of autism spectrum disorder. Jill Alexander lives with her parents. She has a 66 year-old sister who has struggled with similar issues, as well as a brother who is three years older than she is and lives in [place]. Jill Alexander described a long history of therapy and therapeutic programs in order to help her manage these issues, including a partial hospitalization program in December of 2015.  Recent Trigger  Jill Alexander  reported that she was seeking a change in pace with regard to her therapeutic treatment in order to ensure continued progress.  Marital and Family Information  Present family concerns/problems: Jill Alexander shared several accounts of disputes with her mother, including her mother becoming upset with her for sharing worries with her immediately after her mother has woken up, despite her mother's requests that she not do so.  Strengths/resources in the family/friends: Jill Alexander described overall supportive relationships with her parents.  Marital/sexual history patterns: N/A  Family of Origin  Problems in family of origin: None reported.  Family background / ethnic factors: none reported.  No needs/concerns related to ethnicity reported when asked: No  Education/Vocation  Interpersonal concerns/problems: Jill Alexander reported that she has difficulties with social interactions related to her ASD diagnosis, and often perseverates about perceived missteps in both the immediate and distant past.  Personal strengths: Jill Alexander presented as motivated to improve her situation, and with some insight as to what is effective for her.  Military/work problems/concerns: None noted.  Leisure Activities/Daily Functioning  impaired functioning  Legal Status  No Legal Problems  Medical/Nutritional Concerns  unspecified  Comments: Jill Alexander reported that she often perseverates on perceived medical problems, and described herself as a hypochondriac  Religion/Spirituality  Not reported  Other  General Behavior: cooperative  Attire: appropriate  Gait: normal  Motor Activity: normal  Stream of Thought - Productivity: spontaneous  Stream of thought - Progression: normal  Stream of thought - Language: normal  Emotional tone and reactions - Affect: appropriate  Mental trend/Content of thoughts - Perception: normal  Mental trend/Content of thoughts - Orientation: normal  Mental trend/Content of thoughts - Memory:  normal  Mental  trend/Content of thoughts - General knowledge: consistent with education  Insight: good  Judgment: good  Intelligence: average  Diagnostic Summary  Autism Spectrum Disorder (F84.0)   [place] Counselor/Therapist Progress Note   Patient ID: Emersyn Frias, MRN: [id number],     Individualized Treatment Plan Strengths: Enjoys reading, Seeking counseling, In training program through [employer], "I like humor and jokes"  Supports: Her dog, her bestfriend since childhood, her parents, her sister    Goal/Needs for Treatment:  In order of importance to patient 1) Emotional Regulation/Anxiety 2) Improve Positive self talk      Client Statement of Needs: "Find ways of being nicer to myself", cope w/ anxiety and worry    Treatment Level:Outpatient Counseling  Symptoms:Anxiety, Perseverative worry, Negative self talk  Client Treatment Preferences:weekly counseling,  Prefers to be called Jill Alexander    Healthcare consumer's goal for treatment:  Dr. Laroy Apple will support the patient's ability to achieve the goals identified. Cognitive Behavioral Therapy, Supportive Counseling, Mindfulness and Relaxation Strategies and other evidenced-based practices will be used to promote progress towards healthy functioning.    Healthcare consumer will: Actively participate in therapy, working towards healthy functioning.     *Justification for Continuation/Discontinuation of Goal: R=Revised, O=Ongoing, A=Achieved, D=Discontinued   Goal 1) Jill Alexander will develop strategies to regulate her emotions and improve her positive self talk Likert rating baseline date 04/12/21: How Easy - 3; How Often - 50% Target Date Goal Was reviewed Status Code Progress towards goal/Likert rating  05/02/23   New Ongoing                      Jill Alexander participated in development of tx plan and provided verbal consent.     Myrtie Cruise, PhD               Myrtie Cruise, PhD

## 2022-07-11 ENCOUNTER — Telehealth (HOSPITAL_COMMUNITY): Payer: 59 | Admitting: Psychiatry

## 2022-07-11 ENCOUNTER — Telehealth (HOSPITAL_COMMUNITY): Payer: Self-pay | Admitting: Psychiatry

## 2022-07-11 NOTE — Telephone Encounter (Signed)
Patient was not present on video platform used through mychart. I called the patient at our scheduled appointment time. There was no answer. I left a voice message for patient to call the clinic back at their convinence. There was no return phone call during out scheduled visit time. I was not able to speak with the patient today, as they were a no show for their scheduled appointment.   

## 2022-07-16 ENCOUNTER — Ambulatory Visit (INDEPENDENT_AMBULATORY_CARE_PROVIDER_SITE_OTHER): Payer: 59 | Admitting: Clinical

## 2022-07-16 DIAGNOSIS — F840 Autistic disorder: Secondary | ICD-10-CM

## 2022-07-16 NOTE — Progress Notes (Signed)
Time: 11:00 am-12:00 pm CPT Code: [id number] Diagnosis: F84.0  Jill Alexander was seen in person for therapy. Jill Alexander had reached out to the therapist prior to session with agenda items, and session focused on strategies to help her focus at work and accept feedback. Therapist suggested automating what she can, such as by writing out lists and reminders rather than trying to remember every detail. She is scheduled to be seen again in one week.  Intake Presenting Problem Jill Alexander presented seeking CBT to help her manage difficulties with emotion regulation and reframing negative thought patterns that contribute to anxiety and worry. Jill Alexander has a diagnosis of Autism Spectrum Disorder (ASD). Symptoms feeling nervous or on edge, difficulty controlling worry, worrying about a variety of things, difficulty relaxing, irritability, fears for the future, feelings of hopelessness, difficulty sleeping, feeling tired, appetite irregularities, poor self-image, passive suicidal ideation without plan or intent (client contracted for safety during initial session) History of Problem  Jill Alexander reported that she moved from [place] with her family in July of 2018. Since that time, she has been seen by a provider in the area for challenges with emotion regulation and anxiety related to her diagnosis of autism spectrum disorder. Jill Alexander lives with her parents. She has a 68 year-old sister who has struggled with similar issues, as well as a brother who is three years older than she is and lives in [place]. Jill Alexander described a long history of therapy and therapeutic programs in order to help her manage these issues, including a partial hospitalization program in December of 2015.  Recent Trigger  Jill Alexander reported that she was seeking a change in pace with regard to her therapeutic treatment in order to ensure continued progress.  Marital and Family Information  Present family concerns/problems: Jill Alexander shared several accounts of disputes with her  mother, including her mother becoming upset with her for sharing worries with her immediately after her mother has woken up, despite her mother's requests that she not do so.  Strengths/resources in the family/friends: Jill Alexander described overall supportive relationships with her parents.  Marital/sexual history patterns: N/A  Family of Origin  Problems in family of origin: None reported.  Family background / ethnic factors: none reported.  No needs/concerns related to ethnicity reported when asked: No  Education/Vocation  Interpersonal concerns/problems: Jill Alexander reported that she has difficulties with social interactions related to her ASD diagnosis, and often perseverates about perceived missteps in both the immediate and distant past.  Personal strengths: Jill Alexander presented as motivated to improve her situation, and with some insight as to what is effective for her.  Military/work problems/concerns: None noted.  Leisure Activities/Daily Functioning  impaired functioning  Legal Status  No Legal Problems  Medical/Nutritional Concerns  unspecified  Comments: Jill Alexander reported that she often perseverates on perceived medical problems, and described herself as a hypochondriac  Religion/Spirituality  Not reported  Other  General Behavior: cooperative  Attire: appropriate  Gait: normal  Motor Activity: normal  Stream of Thought - Productivity: spontaneous  Stream of thought - Progression: normal  Stream of thought - Language: normal  Emotional tone and reactions - Affect: appropriate  Mental trend/Content of thoughts - Perception: normal  Mental trend/Content of thoughts - Orientation: normal  Mental trend/Content of thoughts - Memory: normal  Mental trend/Content of thoughts - General knowledge: consistent with education  Insight: good  Judgment: good  Intelligence: average  Diagnostic Summary  Autism Spectrum Disorder (F84.0)   [facility] Counselor/Therapist Progress Note    Patient ID: [name],  MRN: [id number],     Individualized Treatment Plan Strengths: Enjoys reading, Seeking counseling, In training program through [employer], "I like humor and jokes"  Supports: Her dog, her bestfriend since childhood, her parents, her sister    Goal/Needs for Treatment:  In order of importance to patient 1) Emotional Regulation/Anxiety 2) Improve Positive self talk      Client Statement of Needs: "Find ways of being nicer to myself", cope w/ anxiety and worry    Treatment Level:Outpatient Counseling  Symptoms:Anxiety, Perseverative worry, Negative self talk  Client Treatment Preferences:weekly counseling,  Prefers to be called Jill Alexander    Healthcare consumer's goal for treatment:  Dr. Laroy Apple will support the patient's ability to achieve the goals identified. Cognitive Behavioral Therapy, Supportive Counseling, Mindfulness and Relaxation Strategies and other evidenced-based practices will be used to promote progress towards healthy functioning.    Healthcare consumer will: Actively participate in therapy, working towards healthy functioning.     *Justification for Continuation/Discontinuation of Goal: R=Revised, O=Ongoing, A=Achieved, D=Discontinued   Goal 1) Jill Alexander will develop strategies to regulate her emotions and improve her positive self talk Likert rating baseline date 04/12/21: How Easy - 3; How Often - 50% Target Date Goal Was reviewed Status Code Progress towards goal/Likert rating  05/02/23   New Ongoing                      Jill Alexander participated in development of tx plan and provided verbal consent.    Myrtie Cruise, PhD               Myrtie Cruise, PhD

## 2022-07-21 ENCOUNTER — Other Ambulatory Visit (HOSPITAL_COMMUNITY): Payer: Self-pay

## 2022-07-22 ENCOUNTER — Other Ambulatory Visit (HOSPITAL_COMMUNITY): Payer: Self-pay

## 2022-07-23 ENCOUNTER — Ambulatory Visit (INDEPENDENT_AMBULATORY_CARE_PROVIDER_SITE_OTHER): Payer: 59 | Admitting: Clinical

## 2022-07-23 ENCOUNTER — Other Ambulatory Visit (HOSPITAL_COMMUNITY): Payer: Self-pay

## 2022-07-23 DIAGNOSIS — F840 Autistic disorder: Secondary | ICD-10-CM

## 2022-07-23 NOTE — Progress Notes (Signed)
Time: 11:00 am-12:00 pm CPT Code: [id number] Diagnosis: F84.0  Jill Alexander was seen in person for therapy. Jill Alexander expressed a desire to talk about feedback she has gotten at work that relates to core features of ASD (for example, to make eye contact and have welcoming body language with customers). Therapist provided an opportunity to process, including to express frustration with the feedback, and engaged her in discussion of whether and how to have a conversation with her boss describing her ASD diagnosis. She indicated a plan to secure message a draft of what she might say to the therapist, for the therapist to review and provide feedback. She is scheduled to be seen again in one week.  Intake Presenting Problem Jill Alexander presented seeking CBT to help her manage difficulties with emotion regulation and reframing negative thought patterns that contribute to anxiety and worry. Jill Alexander has a diagnosis of Autism Spectrum Disorder (ASD). Symptoms feeling nervous or on edge, difficulty controlling worry, worrying about a variety of things, difficulty relaxing, irritability, fears for the future, feelings of hopelessness, difficulty sleeping, feeling tired, appetite irregularities, poor self-image, passive suicidal ideation without plan or intent (client contracted for safety during initial session) History of Problem  Jill Alexander reported that she moved from [place] with her family in July of 2018. Since that time, she has been seen by a provider in the area for challenges with emotion regulation and anxiety related to her diagnosis of autism spectrum disorder. Jill Alexander lives with her parents. She has a 80 year-old sister who has struggled with similar issues, as well as a brother who is three years older than she is and lives in [place]. Jill Alexander described a long history of therapy and therapeutic programs in order to help her manage these issues, including a partial hospitalization program in December of 2015.  Recent Trigger   Jill Alexander reported that she was seeking a change in pace with regard to her therapeutic treatment in order to ensure continued progress.  Marital and Family Information  Present family concerns/problems: Jill Alexander shared several accounts of disputes with her mother, including her mother becoming upset with her for sharing worries with her immediately after her mother has woken up, despite her mother's requests that she not do so.  Strengths/resources in the family/friends: Jill Alexander described overall supportive relationships with her parents.  Marital/sexual history patterns: N/A  Family of Origin  Problems in family of origin: None reported.  Family background / ethnic factors: none reported.  No needs/concerns related to ethnicity reported when asked: No  Education/Vocation  Interpersonal concerns/problems: Jill Alexander reported that she has difficulties with social interactions related to her ASD diagnosis, and often perseverates about perceived missteps in both the immediate and distant past.  Personal strengths: Jill Alexander presented as motivated to improve her situation, and with some insight as to what is effective for her.  Military/work problems/concerns: None noted.  Leisure Activities/Daily Functioning  impaired functioning  Legal Status  No Legal Problems  Medical/Nutritional Concerns  unspecified  Comments: Jill Alexander reported that she often perseverates on perceived medical problems, and described herself as a hypochondriac  Religion/Spirituality  Not reported  Other  General Behavior: cooperative  Attire: appropriate  Gait: normal  Motor Activity: normal  Stream of Thought - Productivity: spontaneous  Stream of thought - Progression: normal  Stream of thought - Language: normal  Emotional tone and reactions - Affect: appropriate  Mental trend/Content of thoughts - Perception: normal  Mental trend/Content of thoughts - Orientation: normal  Mental trend/Content of thoughts - Memory: normal  Mental  trend/Content of thoughts - General knowledge: consistent with education  Insight: good  Judgment: good  Intelligence: average  Diagnostic Summary  Autism Spectrum Disorder (F84.0)   [facility] Counselor/Therapist Progress Note   Patient ID: Neasha Conneely, MRN: [id number],     Individualized Treatment Plan Strengths: Enjoys reading, Seeking counseling, In training program through [employer], "I like humor and jokes"  Supports: Her dog, her bestfriend since childhood, her parents, her sister    Goal/Needs for Treatment:  In order of importance to patient 1) Emotional Regulation/Anxiety 2) Improve Positive self talk      Client Statement of Needs: "Find ways of being nicer to myself", cope w/ anxiety and worry    Treatment Level:Outpatient Counseling  Symptoms:Anxiety, Perseverative worry, Negative self talk  Client Treatment Preferences:weekly counseling,  Prefers to be called Jill Alexander    Healthcare consumer's goal for treatment:  Dr. Laroy Apple will support the patient's ability to achieve the goals identified. Cognitive Behavioral Therapy, Supportive Counseling, Mindfulness and Relaxation Strategies and other evidenced-based practices will be used to promote progress towards healthy functioning.    Healthcare consumer will: Actively participate in therapy, working towards healthy functioning.     *Justification for Continuation/Discontinuation of Goal: R=Revised, O=Ongoing, A=Achieved, D=Discontinued   Goal 1) Jill Alexander will develop strategies to regulate her emotions and improve her positive self talk Likert rating baseline date 04/12/21: How Easy - 3; How Often - 50% Target Date Goal Was reviewed Status Code Progress towards goal/Likert rating  05/02/23   New Ongoing                      Jill Alexander participated in development of tx plan and provided verbal consent.   Myrtie Cruise, PhD               Myrtie Cruise, PhD

## 2022-07-24 ENCOUNTER — Ambulatory Visit
Admission: RE | Admit: 2022-07-24 | Discharge: 2022-07-24 | Disposition: A | Discharge status: Home / Self Care | Payer: 59 | Source: Ambulatory Visit | Attending: Neurology | Admitting: Neurology

## 2022-07-24 ENCOUNTER — Other Ambulatory Visit (HOSPITAL_COMMUNITY): Payer: Self-pay | Admitting: Psychiatry

## 2022-07-24 DIAGNOSIS — G935 Compression of brain: Secondary | ICD-10-CM

## 2022-07-24 DIAGNOSIS — R569 Unspecified convulsions: Secondary | ICD-10-CM | POA: Diagnosis not present

## 2022-07-24 DIAGNOSIS — R4701 Aphasia: Secondary | ICD-10-CM | POA: Diagnosis not present

## 2022-07-24 DIAGNOSIS — F0630 Mood disorder due to known physiological condition, unspecified: Secondary | ICD-10-CM | POA: Diagnosis not present

## 2022-07-24 DIAGNOSIS — R5381 Other malaise: Secondary | ICD-10-CM

## 2022-07-24 DIAGNOSIS — F840 Autistic disorder: Secondary | ICD-10-CM

## 2022-07-24 DIAGNOSIS — F332 Major depressive disorder, recurrent severe without psychotic features: Secondary | ICD-10-CM

## 2022-07-24 DIAGNOSIS — R5382 Chronic fatigue, unspecified: Secondary | ICD-10-CM | POA: Diagnosis not present

## 2022-07-24 MED ORDER — GADOPICLENOL 0.5 MMOL/ML IV SOLN
7.5000 mL | Freq: Once | INTRAVENOUS | Status: AC | PRN
  Administered 2022-07-24: 6 mL via INTRAVENOUS

## 2022-07-25 ENCOUNTER — Encounter: Payer: Self-pay | Admitting: Neurology

## 2022-07-25 NOTE — Telephone Encounter (Signed)
Will do!

## 2022-07-30 ENCOUNTER — Ambulatory Visit (INDEPENDENT_AMBULATORY_CARE_PROVIDER_SITE_OTHER): Payer: 59 | Admitting: Clinical

## 2022-07-30 DIAGNOSIS — F840 Autistic disorder: Secondary | ICD-10-CM

## 2022-07-30 NOTE — Progress Notes (Signed)
Time: 11:00 am-12:00 pm CPT Code: [id number] Diagnosis: F84.0  Jill Alexander was seen in person for therapy. She had drafted a message to her boss disclosing her ASD diagnosis, and much of session focused on processing her message and considering what accommodations she might ask for. She had had an emotional outburst at home, and therapist encouraged her to try positive affirmations, with the plan to discuss further in her next session. She is scheduled to be seen again in one week.  Intake Presenting Problem Jill Alexander presented seeking CBT to help her manage difficulties with emotion regulation and reframing negative thought patterns that contribute to anxiety and worry. Jill Alexander has a diagnosis of Autism Spectrum Disorder (ASD). Symptoms feeling nervous or on edge, difficulty controlling worry, worrying about a variety of things, difficulty relaxing, irritability, fears for the future, feelings of hopelessness, difficulty sleeping, feeling tired, appetite irregularities, poor self-image, passive suicidal ideation without plan or intent (client contracted for safety during initial session) History of Problem  Jill Alexander reported that she moved from [place] with her family in July of 2018. Since that time, she has been seen by a provider in the area for challenges with emotion regulation and anxiety related to her diagnosis of autism spectrum disorder. Jill Alexander lives with her parents. She has a 55 year-old sister who has struggled with similar issues, as well as a brother who is three years older than she is and lives in [place]. Jill Alexander described a long history of therapy and therapeutic programs in order to help her manage these issues, including a partial hospitalization program in December of 2015.  Recent Trigger  Jill Alexander reported that she was seeking a change in pace with regard to her therapeutic treatment in order to ensure continued progress.  Marital and Family Information  Present family concerns/problems: Jill Alexander  shared several accounts of disputes with her mother, including her mother becoming upset with her for sharing worries with her immediately after her mother has woken up, despite her mother's requests that she not do so.  Strengths/resources in the family/friends: Jill Alexander described overall supportive relationships with her parents.  Marital/sexual history patterns: N/A  Family of Origin  Problems in family of origin: None reported.  Family background / ethnic factors: none reported.  No needs/concerns related to ethnicity reported when asked: No  Education/Vocation  Interpersonal concerns/problems: Jill Alexander reported that she has difficulties with social interactions related to her ASD diagnosis, and often perseverates about perceived missteps in both the immediate and distant past.  Personal strengths: Jill Alexander presented as motivated to improve her situation, and with some insight as to what is effective for her.  Military/work problems/concerns: None noted.  Leisure Activities/Daily Functioning  impaired functioning  Legal Status  No Legal Problems  Medical/Nutritional Concerns  unspecified  Comments: Jill Alexander reported that she often perseverates on perceived medical problems, and described herself as a hypochondriac  Religion/Spirituality  Not reported  Other  General Behavior: cooperative  Attire: appropriate  Gait: normal  Motor Activity: normal  Stream of Thought - Productivity: spontaneous  Stream of thought - Progression: normal  Stream of thought - Language: normal  Emotional tone and reactions - Affect: appropriate  Mental trend/Content of thoughts - Perception: normal  Mental trend/Content of thoughts - Orientation: normal  Mental trend/Content of thoughts - Memory: normal  Mental trend/Content of thoughts - General knowledge: consistent with education  Insight: good  Judgment: good  Intelligence: average  Diagnostic Summary  Autism Spectrum Disorder (F84.0)   [facility] Counselor/Therapist Progress Note  Patient ID: Jill Alexander, MRN: [id number],     Individualized Treatment Plan Strengths: Enjoys reading, Seeking counseling, In training program through [employer], "I like humor and jokes"  Supports: Her dog, her bestfriend since childhood, her parents, her sister    Goal/Needs for Treatment:  In order of importance to patient 1) Emotional Regulation/Anxiety 2) Improve Positive self talk      Client Statement of Needs: "Find ways of being nicer to myself", cope w/ anxiety and worry    Treatment Level:Outpatient Counseling  Symptoms:Anxiety, Perseverative worry, Negative self talk  Client Treatment Preferences:weekly counseling,  Prefers to be called Jill Alexander    Healthcare consumer's goal for treatment:  Dr. Laroy Apple will support the patient's ability to achieve the goals identified. Cognitive Behavioral Therapy, Supportive Counseling, Mindfulness and Relaxation Strategies and other evidenced-based practices will be used to promote progress towards healthy functioning.    Healthcare consumer will: Actively participate in therapy, working towards healthy functioning.     *Justification for Continuation/Discontinuation of Goal: R=Revised, O=Ongoing, A=Achieved, D=Discontinued   Goal 1) Jill Alexander will develop strategies to regulate her emotions and improve her positive self talk Likert rating baseline date 04/12/21: How Easy - 3; How Often - 50% Target Date Goal Was reviewed Status Code Progress towards goal/Likert rating  05/02/23   New Ongoing                      Jill Alexander participated in development of tx plan and provided verbal consent.            Myrtie Cruise, PhD               Myrtie Cruise, PhD

## 2022-08-01 ENCOUNTER — Other Ambulatory Visit (HOSPITAL_COMMUNITY): Payer: Self-pay

## 2022-08-01 ENCOUNTER — Telehealth (HOSPITAL_BASED_OUTPATIENT_CLINIC_OR_DEPARTMENT_OTHER): Payer: 59 | Admitting: Psychiatry

## 2022-08-01 ENCOUNTER — Telehealth (HOSPITAL_COMMUNITY): Payer: 59 | Admitting: Psychiatry

## 2022-08-01 DIAGNOSIS — F411 Generalized anxiety disorder: Secondary | ICD-10-CM

## 2022-08-01 DIAGNOSIS — F332 Major depressive disorder, recurrent severe without psychotic features: Secondary | ICD-10-CM | POA: Diagnosis not present

## 2022-08-01 DIAGNOSIS — F410 Panic disorder [episodic paroxysmal anxiety] without agoraphobia: Secondary | ICD-10-CM

## 2022-08-01 MED ORDER — LORAZEPAM 1 MG PO TABS
1.0000 mg | ORAL_TABLET | Freq: Two times a day (BID) | ORAL | 1 refills | Status: DC | PRN
  Filled 2022-08-01 – 2022-08-20 (×2): qty 60, 30d supply, fill #0
  Filled 2022-11-01: qty 60, 30d supply, fill #1

## 2022-08-01 MED ORDER — LURASIDONE HCL 40 MG PO TABS
40.0000 mg | ORAL_TABLET | Freq: Every day | ORAL | 0 refills | Status: DC
  Filled 2022-08-01 – 2022-10-02 (×3): qty 90, 90d supply, fill #0

## 2022-08-01 MED ORDER — AUVELITY 45-105 MG PO TBCR
1.0000 | EXTENDED_RELEASE_TABLET | Freq: Two times a day (BID) | ORAL | 0 refills | Status: DC
  Filled 2022-08-01 (×2): qty 90, 45d supply, fill #0
  Filled 2022-08-04: qty 60, 30d supply, fill #0
  Filled 2022-08-21: qty 90, 45d supply, fill #0

## 2022-08-01 NOTE — Progress Notes (Signed)
Virtual Visit via Video Note  I connected with [name] on 08/01/22 at  2:15 PM EDT by a video enabled telemedicine application and verified that I am speaking with the correct person using two identifiers.  Location: Patient: home Provider: office   I discussed the limitations of evaluation and management by telemedicine and the availability of in person appointments. The patient expressed understanding and agreed to proceed.  History of Present Illness: [name] and her mom are present for today's visit. Auvelity was working. She ran out and had trouble getting it filled but was never out of it. Her mood was better on it until recently. She has a new job in February. She has been more busy and her routine is different now. It is stressful and she finds it hard to focus. [name] thinks that this is contributing to her mood problems.  She has only been depressed a few days in the last 2 weeks as compared to before. Her sleep is poor. She has trouble falling and staying asleep. She is getting 8 hrs but it is not restful. She is very tired. Her anxiety remains high. She has panic attacks at least 1-2x/week. [name] admits that she is happier and is more active than before starting Auvelity. Her is coping with more stress faster than before. She agrees with her mom about the anxiety and depression being more situational now. [name] is going to therapy once/week. She denies SI/HI.   [name]'s mom shares that [name] is doing better on Auvelity. Mom also shares that her autism and communication skills really play a big role in her mood and anxiety. She is now able to process her day and is better at coping with stress. She is now working 6 hrs/week. Mom doesn't think [name] understands her emotions and behaviors due to the autism.  She is more active and motivated to do things since starting Auvelity. Mom states once she fall asleep then [name] sleeps deeply. It takes 3+ alarms to wake her up. [name] has low  self esteem and insecurity. Mom also thinks that the lack of friends plays a role in her mood. Mom tried to get the Auvelity filled but it was very hard to get someone in the office. She escalated it to the complaints department because Mom was having so much trouble getting a response in the office. Her MRI and EEG are normal. Her sleep study was normal. Mom wants [name] to stay on Auvelity because there are some positive changes.    Observations/Objective: Psychiatric Specialty Exam: ROS  There were no vitals taken for this visit.There is no height or weight on file to calculate BMI.  General Appearance: Casual and Fairly Groomed  Eye Contact:  Minimal  Speech:  Clear and Coherent and Slow  Volume:  Normal  Mood:  Anxious and Depressed  Affect:  Blunt  Thought Process:  Coherent and Descriptions of Associations: Circumstantial  Orientation:  Full (Time, Place, and Person)  Thought Content:  Logical  Suicidal Thoughts:  No  Homicidal Thoughts:  No  Memory:  Immediate;   Good  Judgement:  Fair  Insight:  Shallow  Psychomotor Activity:  Normal  Concentration:  Concentration: Fair  Recall:  [employer] of Knowledge:  Fair  Language:  Fair  Akathisia:  No  Handed:  Right  AIMS (if indicated):     Assets:  Communication Skills Desire for Improvement Financial Resources/Insurance Housing Resilience Social Support Talents/Skills Transportation Vocational/Educational  ADL's:  Intact  Cognition:  WNL  Sleep:        Assessment and Plan:     08/01/2022    2:34 PM 03/14/2022   10:14 AM 02/21/2022   10:37 AM 02/12/2022   10:57 AM 02/06/2022   10:48 AM  Depression screen PHQ 2/9  Decreased Interest 0 2 2 3 3   Down, Depressed, Hopeless 1 3 3 3 3   PHQ - 2 Score 1 5 5 6 6   Altered sleeping  1 1 3 3   Tired, decreased energy  3 3 3 3   Change in appetite  3 3 3 2   Feeling bad or failure about yourself   3 3 3 3   Trouble concentrating  3 3 3 3   Moving slowly or fidgety/restless   2 2 2 2   Suicidal thoughts  3 3 3 3   PHQ-9 Score  23 23 26 25   Difficult doing work/chores  Extremely dIfficult  Extremely dIfficult Very difficult    Flowsheet Row Video Visit from 08/01/2022 in [facility] Video Visit from 03/14/2022 in [facility] Counselor from 02/21/2022 in [facility] No Risk Error: Q7 should not be populated when Q6 is No Error: Question 6 not populated          Pt is aware that these meds carry a teratogenic risk. Pt will discuss plan of action if she does or plans to become pregnant in the future.  Status of current problems: improvement in depression and anxiety symptoms since starting Auvelity.    Medication management with supportive therapy. Risks and benefits, side effects and alternative treatment options discussed with patient. Pt was given an opportunity to ask questions about medication, illness, and treatment. All current psychiatric medications have been reviewed and discussed with the patient and adjusted as clinically appropriate.  Pt verbalized understanding and verbal consent obtained for treatment.  Meds:  1. MDD (major depressive disorder), recurrent severe, without psychosis - Dextromethorphan-buPROPion ER (AUVELITY) 45-105 MG TBCR; Take 1 tablet by mouth 2 (two) times daily.  Dispense: 90 tablet; Refill: 0 - lurasidone (LATUDA) 40 MG TABS tablet; Take 1 tablet (40 mg total) by mouth daily with breakfast.  Dispense: 90 tablet; Refill: 0  2. Panic attacks - LORazepam (ATIVAN) 1 MG tablet; Take 1 tablet (1 mg total) by mouth 2 (two) times daily as needed for anxiety.  Dispense: 60 tablet; Refill: 1 - lurasidone (LATUDA) 40 MG TABS tablet; Take 1 tablet (40 mg total) by mouth daily with breakfast.  Dispense: 90 tablet; Refill: 0  3. GAD (generalized anxiety disorder) - LORazepam (ATIVAN) 1 MG tablet; Take 1  tablet (1 mg total) by mouth 2 (two) times daily as needed for anxiety.  Dispense: 60 tablet; Refill: 1 - lurasidone (LATUDA) 40 MG TABS tablet; Take 1 tablet (40 mg total) by mouth daily with breakfast.  Dispense: 90 tablet; Refill: 0     Labs: none today    Therapy: brief supportive therapy provided. Discussed psychosocial stressors in detail.     Collaboration of Care: Other therapy weekly  Patient/Guardian was advised Release of Information must be obtained prior to any record release in order to collaborate their care with an outside provider. Patient/Guardian was advised if they have not already done so to contact the registration department to sign all necessary forms in order for [place] to release information regarding their care.   Consent: Patient/Guardian gives verbal consent for treatment and assignment of benefits for services provided  during this visit. Patient/Guardian expressed understanding and agreed to proceed.       Follow Up Instructions: Follow up in 2 months  or sooner if needed    I discussed the assessment and treatment plan with the patient. The patient was provided an opportunity to ask questions and all were answered. The patient agreed with the plan and demonstrated an understanding of the instructions.   The patient was advised to call back or seek an in-person evaluation if the symptoms worsen or if the condition fails to improve as anticipated.  I provided 24 minutes of non-face-to-face time during this encounter.   Charlcie Cradle, MD

## 2022-08-05 ENCOUNTER — Other Ambulatory Visit (HOSPITAL_COMMUNITY): Payer: Self-pay

## 2022-08-05 ENCOUNTER — Other Ambulatory Visit: Payer: Self-pay

## 2022-08-06 ENCOUNTER — Ambulatory Visit: Payer: 59 | Admitting: Clinical

## 2022-08-07 ENCOUNTER — Ambulatory Visit (INDEPENDENT_AMBULATORY_CARE_PROVIDER_SITE_OTHER): Payer: 59 | Admitting: Clinical

## 2022-08-07 DIAGNOSIS — F840 Autistic disorder: Secondary | ICD-10-CM | POA: Diagnosis not present

## 2022-08-07 NOTE — Progress Notes (Signed)
Time: 11:00 am-12:00 pm CPT Code: 01751W Diagnosis: F84.0  Jill Alexander was seen in person for therapy. She reported that her conversation with her boss had gone well, and shared that she had been thinking of leaving the [facility] program, in favor of finding a coach she can meet with one:one. Therapist provided the information of a potential option, and encouraged her to establish a plan with a coach before leaving [facility]. She is scheduled to be seen again in one week.  Intake Presenting Problem Jill Alexander presented seeking CBT to help her manage difficulties with emotion regulation and reframing negative thought patterns that contribute to anxiety and worry. Jill Alexander has a diagnosis of Autism Spectrum Disorder (ASD). Symptoms feeling nervous or on edge, difficulty controlling worry, worrying about a variety of things, difficulty relaxing, irritability, fears for the future, feelings of hopelessness, difficulty sleeping, feeling tired, appetite irregularities, poor self-image, passive suicidal ideation without plan or intent (client contracted for safety during initial session) History of Problem  Jill Alexander reported that she moved from [place] with her family in July of 2018. Since that time, she has been seen by a provider in the area for challenges with emotion regulation and anxiety related to her diagnosis of autism spectrum disorder. Jill Alexander lives with her parents. She has a 29 year-old sister who has struggled with similar issues, as well as a brother who is three years older than she is and lives in [place]. Jill Alexander described a long history of therapy and therapeutic programs in order to help her manage these issues, including a partial hospitalization program in December of 2015.  Recent Trigger  Jill Alexander reported that she was seeking a change in pace with regard to her therapeutic treatment in order to ensure continued progress.  Marital and Family Information  Present family concerns/problems: Jill Alexander shared several  accounts of disputes with her mother, including her mother becoming upset with her for sharing worries with her immediately after her mother has woken up, despite her mother's requests that she not do so.  Strengths/resources in the family/friends: Jill Alexander described overall supportive relationships with her parents.  Marital/sexual history patterns: N/A  Family of Origin  Problems in family of origin: None reported.  Family background / ethnic factors: none reported.  No needs/concerns related to ethnicity reported when asked: No  Education/Vocation  Interpersonal concerns/problems: Jill Alexander reported that she has difficulties with social interactions related to her ASD diagnosis, and often perseverates about perceived missteps in both the immediate and distant past.  Personal strengths: Jill Alexander presented as motivated to improve her situation, and with some insight as to what is effective for her.  Military/work problems/concerns: None noted.  Leisure Activities/Daily Functioning  impaired functioning  Legal Status  No Legal Problems  Medical/Nutritional Concerns  unspecified  Comments: Jill Alexander reported that she often perseverates on perceived medical problems, and described herself as a hypochondriac  Religion/Spirituality  Not reported  Other  General Behavior: cooperative  Attire: appropriate  Gait: normal  Motor Activity: normal  Stream of Thought - Productivity: spontaneous  Stream of thought - Progression: normal  Stream of thought - Language: normal  Emotional tone and reactions - Affect: appropriate  Mental trend/Content of thoughts - Perception: normal  Mental trend/Content of thoughts - Orientation: normal  Mental trend/Content of thoughts - Memory: normal  Mental trend/Content of thoughts - General knowledge: consistent with education  Insight: good  Judgment: good  Intelligence: average  Diagnostic Summary  Autism Spectrum Disorder (F84.0)   [facility]  Counselor/Therapist Progress Note  Patient ID: Jill Alexander, MRN: [id number],     Individualized Treatment Plan Strengths: Enjoys reading, Seeking counseling, In training program through [employer], "I like humor and jokes"  Supports: Her dog, her bestfriend since childhood, her parents, her sister    Goal/Needs for Treatment:  In order of importance to patient 1) Emotional Regulation/Anxiety 2) Improve Positive self talk      Client Statement of Needs: "Find ways of being nicer to myself", cope w/ anxiety and worry    Treatment Level:Outpatient Counseling  Symptoms:Anxiety, Perseverative worry, Negative self talk  Client Treatment Preferences:weekly counseling,  Prefers to be called Jill Alexander    Healthcare consumer's goal for treatment:  Dr. Charlyne Mom will support the patient's ability to achieve the goals identified. Cognitive Behavioral Therapy, Supportive Counseling, Mindfulness and Relaxation Strategies and other evidenced-based practices will be used to promote progress towards healthy functioning.    Healthcare consumer will: Actively participate in therapy, working towards healthy functioning.     *Justification for Continuation/Discontinuation of Goal: R=Revised, O=Ongoing, A=Achieved, D=Discontinued   Goal 1) Jill Alexander will develop strategies to regulate her emotions and improve her positive self talk Likert rating baseline date 04/12/21: How Easy - 3; How Often - 50% Target Date Goal Was reviewed Status Code Progress towards goal/Likert rating  05/02/23   New Ongoing                      Jill Alexander participated in development of tx plan and provided verbal consent.            Chrissie Noa, PhD               Chrissie Noa, PhD               Chrissie Noa, PhD

## 2022-08-08 ENCOUNTER — Other Ambulatory Visit (HOSPITAL_COMMUNITY): Payer: Self-pay

## 2022-08-08 DIAGNOSIS — R0789 Other chest pain: Secondary | ICD-10-CM | POA: Diagnosis not present

## 2022-08-08 DIAGNOSIS — K219 Gastro-esophageal reflux disease without esophagitis: Secondary | ICD-10-CM | POA: Diagnosis not present

## 2022-08-08 MED ORDER — OMEPRAZOLE 20 MG PO CPDR
20.0000 mg | DELAYED_RELEASE_CAPSULE | Freq: Every morning | ORAL | 0 refills | Status: DC
  Filled 2022-08-08: qty 30, 30d supply, fill #0

## 2022-08-09 ENCOUNTER — Other Ambulatory Visit (HOSPITAL_COMMUNITY): Payer: Self-pay

## 2022-08-13 ENCOUNTER — Ambulatory Visit (INDEPENDENT_AMBULATORY_CARE_PROVIDER_SITE_OTHER): Payer: 59 | Admitting: Clinical

## 2022-08-13 DIAGNOSIS — F840 Autistic disorder: Secondary | ICD-10-CM | POA: Diagnosis not present

## 2022-08-13 NOTE — Progress Notes (Signed)
Time: 11:00 am-12:00 pm CPT Code: 95621H Diagnosis: F84.0  Jill Alexander was seen remotely using secure video conferencing. She was in her home and therapist was in her home at the time of the appointment. Session focused on feedback she had gotten at work, as well as her tendency to be sensitive and sometimes lash out toward her parents. Therapist asked her to pay attention to whether she becomes irritated with other people and manages it, or whether her parents are particularly triggering for her. She is scheduled to be seen again in one week.  Intake Presenting Problem Jill Alexander presented seeking CBT to help her manage difficulties with emotion regulation and reframing negative thought patterns that contribute to anxiety and worry. Jill Alexander has a diagnosis of Autism Spectrum Disorder (ASD). Symptoms feeling nervous or on edge, difficulty controlling worry, worrying about a variety of things, difficulty relaxing, irritability, fears for the future, feelings of hopelessness, difficulty sleeping, feeling tired, appetite irregularities, poor self-image, passive suicidal ideation without plan or intent (client contracted for safety during initial session) History of Problem  Jill Alexander reported that she moved from [place] with her family in July of 2018. Since that time, she has been seen by a provider in the area for challenges with emotion regulation and anxiety related to her diagnosis of autism spectrum disorder. Jill Alexander lives with her parents. She has a 1 year-old sister who has struggled with similar issues, as well as a brother who is three years older than she is and lives in [place]. Jill Alexander described a long history of therapy and therapeutic programs in order to help her manage these issues, including a partial hospitalization program in December of 2015.  Recent Trigger  Jill Alexander reported that she was seeking a change in pace with regard to her therapeutic treatment in order to ensure continued progress.  Marital and  Family Information  Present family concerns/problems: Jill Alexander shared several accounts of disputes with her mother, including her mother becoming upset with her for sharing worries with her immediately after her mother has woken up, despite her mother's requests that she not do so.  Strengths/resources in the family/friends: Jill Alexander described overall supportive relationships with her parents.  Marital/sexual history patterns: N/A  Family of Origin  Problems in family of origin: None reported.  Family background / ethnic factors: none reported.  No needs/concerns related to ethnicity reported when asked: No  Education/Vocation  Interpersonal concerns/problems: Jill Alexander reported that she has difficulties with social interactions related to her ASD diagnosis, and often perseverates about perceived missteps in both the immediate and distant past.  Personal strengths: Jill Alexander presented as motivated to improve her situation, and with some insight as to what is effective for her.  Military/work problems/concerns: None noted.  Leisure Activities/Daily Functioning  impaired functioning  Legal Status  No Legal Problems  Medical/Nutritional Concerns  unspecified  Comments: Jill Alexander reported that she often perseverates on perceived medical problems, and described herself as a hypochondriac  Religion/Spirituality  Not reported  Other  General Behavior: cooperative  Attire: appropriate  Gait: normal  Motor Activity: normal  Stream of Thought - Productivity: spontaneous  Stream of thought - Progression: normal  Stream of thought - Language: normal  Emotional tone and reactions - Affect: appropriate  Mental trend/Content of thoughts - Perception: normal  Mental trend/Content of thoughts - Orientation: normal  Mental trend/Content of thoughts - Memory: normal  Mental trend/Content of thoughts - General knowledge: consistent with education  Insight: good  Judgment: good  Intelligence: average  Diagnostic  Summary  Autism Spectrum Disorder (F84.0)   [facility] Counselor/Therapist Progress Note   Patient ID: Jill Alexander, MRN: [id number],     Individualized Treatment Plan Strengths: Enjoys reading, Seeking counseling, In training program through [employer], "I like humor and jokes"  Supports: Her dog, her bestfriend since childhood, her parents, her sister    Goal/Needs for Treatment:  In order of importance to patient 1) Emotional Regulation/Anxiety 2) Improve Positive self talk      Client Statement of Needs: "Find ways of being nicer to myself", cope w/ anxiety and worry    Treatment Level:Outpatient Counseling  Symptoms:Anxiety, Perseverative worry, Negative self talk  Client Treatment Preferences:weekly counseling,  Prefers to be called Jill Alexander    Healthcare consumer's goal for treatment:  Dr. Charlyne Mom will support the patient's ability to achieve the goals identified. Cognitive Behavioral Therapy, Supportive Counseling, Mindfulness and Relaxation Strategies and other evidenced-based practices will be used to promote progress towards healthy functioning.    Healthcare consumer will: Actively participate in therapy, working towards healthy functioning.     *Justification for Continuation/Discontinuation of Goal: R=Revised, O=Ongoing, A=Achieved, D=Discontinued   Goal 1) Jill Alexander will develop strategies to regulate her emotions and improve her positive self talk Likert rating baseline date 04/12/21: How Easy - 3; How Often - 50% Target Date Goal Was reviewed Status Code Progress towards goal/Likert rating  05/02/23   New Ongoing                      Jill Alexander participated in development of tx plan and provided verbal consent.            Chrissie Noa, PhD                  Chrissie Noa, PhD               Chrissie Noa, PhD

## 2022-08-20 ENCOUNTER — Other Ambulatory Visit (HOSPITAL_COMMUNITY): Payer: Self-pay

## 2022-08-20 ENCOUNTER — Ambulatory Visit: Payer: Medicaid Other | Admitting: Clinical

## 2022-08-20 NOTE — Telephone Encounter (Signed)
Erroneous

## 2022-08-21 ENCOUNTER — Other Ambulatory Visit (HOSPITAL_COMMUNITY): Payer: Self-pay

## 2022-08-21 ENCOUNTER — Other Ambulatory Visit: Payer: Self-pay

## 2022-08-22 ENCOUNTER — Other Ambulatory Visit (HOSPITAL_COMMUNITY): Payer: Self-pay

## 2022-08-23 ENCOUNTER — Ambulatory Visit (INDEPENDENT_AMBULATORY_CARE_PROVIDER_SITE_OTHER): Payer: 59 | Admitting: Clinical

## 2022-08-23 ENCOUNTER — Other Ambulatory Visit: Payer: Self-pay | Admitting: Neurology

## 2022-08-23 ENCOUNTER — Other Ambulatory Visit (HOSPITAL_COMMUNITY): Payer: Self-pay

## 2022-08-23 ENCOUNTER — Other Ambulatory Visit: Payer: Self-pay

## 2022-08-23 DIAGNOSIS — F840 Autistic disorder: Secondary | ICD-10-CM | POA: Diagnosis not present

## 2022-08-23 MED ORDER — MYRBETRIQ 50 MG PO TB24
50.0000 mg | ORAL_TABLET | Freq: Every day | ORAL | 3 refills | Status: DC
  Filled 2022-08-23 – 2022-09-07 (×2): qty 30, 30d supply, fill #0
  Filled 2022-10-02: qty 30, 30d supply, fill #1
  Filled 2022-10-29 – 2022-11-04 (×2): qty 30, 30d supply, fill #2
  Filled 2022-12-02: qty 30, 30d supply, fill #3

## 2022-08-23 NOTE — Progress Notes (Signed)
Time: 2:00 pm-2:55 pm CPT Code: 16109U Diagnosis: F84.0  Jill Alexander was seen remotely using secure video conferencing. She was in her home and therapist was in her home at the time of the appointment. Session focused on processing her concern as to what enjoying certain styles of fan fiction might mean about her. Therapist encouraged her to consider this in light of her own values. She also expressed that she would like to cut down on phone use. For homework, she will jot down some notes about how she uses her phone. She is scheduled to be seen again in one week.  Intake Presenting Problem Jill Alexander presented seeking CBT to help her manage difficulties with emotion regulation and reframing negative thought patterns that contribute to anxiety and worry. Jill Alexander has a diagnosis of Autism Spectrum Disorder (ASD). Symptoms feeling nervous or on edge, difficulty controlling worry, worrying about a variety of things, difficulty relaxing, irritability, fears for the future, feelings of hopelessness, difficulty sleeping, feeling tired, appetite irregularities, poor self-image, passive suicidal ideation without plan or intent (client contracted for safety during initial session) History of Problem  Jill Alexander reported that she moved from [place] with her family in July of 2018. Since that time, she has been seen by a provider in the area for challenges with emotion regulation and anxiety related to her diagnosis of autism spectrum disorder. Jill Alexander lives with her parents. She has a 32 year-old sister who has struggled with similar issues, as well as a brother who is three years older than she is and lives in [place]. Jill Alexander described a long history of therapy and therapeutic programs in order to help her manage these issues, including a partial hospitalization program in December of 2015.  Recent Trigger  Jill Alexander reported that she was seeking a change in pace with regard to her therapeutic treatment in order to ensure continued  progress.  Marital and Family Information  Present family concerns/problems: Jill Alexander shared several accounts of disputes with her mother, including her mother becoming upset with her for sharing worries with her immediately after her mother has woken up, despite her mother's requests that she not do so.  Strengths/resources in the family/friends: Jill Alexander described overall supportive relationships with her parents.  Marital/sexual history patterns: N/A  Family of Origin  Problems in family of origin: None reported.  Family background / ethnic factors: none reported.  No needs/concerns related to ethnicity reported when asked: No  Education/Vocation  Interpersonal concerns/problems: Jill Alexander reported that she has difficulties with social interactions related to her ASD diagnosis, and often perseverates about perceived missteps in both the immediate and distant past.  Personal strengths: Jill Alexander presented as motivated to improve her situation, and with some insight as to what is effective for her.  Military/work problems/concerns: None noted.  Leisure Activities/Daily Functioning  impaired functioning  Legal Status  No Legal Problems  Medical/Nutritional Concerns  unspecified  Comments: Jill Alexander reported that she often perseverates on perceived medical problems, and described herself as a hypochondriac  Religion/Spirituality  Not reported  Other  General Behavior: cooperative  Attire: appropriate  Gait: normal  Motor Activity: normal  Stream of Thought - Productivity: spontaneous  Stream of thought - Progression: normal  Stream of thought - Language: normal  Emotional tone and reactions - Affect: appropriate  Mental trend/Content of thoughts - Perception: normal  Mental trend/Content of thoughts - Orientation: normal  Mental trend/Content of thoughts - Memory: normal  Mental trend/Content of thoughts - General knowledge: consistent with education  Insight: good  Judgment:  good  Intelligence:  average  Diagnostic Summary  Autism Spectrum Disorder (F84.0)   [facility] Counselor/Therapist Progress Note   Patient ID: Jill Alexander, MRN: [id number],     Individualized Treatment Plan Strengths: Enjoys reading, Seeking counseling, In training program through [employer], "I like humor and jokes"  Supports: Her dog, her bestfriend since childhood, her parents, her sister    Goal/Needs for Treatment:  In order of importance to patient 1) Emotional Regulation/Anxiety 2) Improve Positive self talk      Client Statement of Needs: "Find ways of being nicer to myself", cope w/ anxiety and worry    Treatment Level:Outpatient Counseling  Symptoms:Anxiety, Perseverative worry, Negative self talk  Client Treatment Preferences:weekly counseling,  Prefers to be called Jill Alexander    Healthcare consumer's goal for treatment:  Dr. Charlyne Mom will support the patient's ability to achieve the goals identified. Cognitive Behavioral Therapy, Supportive Counseling, Mindfulness and Relaxation Strategies and other evidenced-based practices will be used to promote progress towards healthy functioning.    Healthcare consumer will: Actively participate in therapy, working towards healthy functioning.     *Justification for Continuation/Discontinuation of Goal: R=Revised, O=Ongoing, A=Achieved, D=Discontinued   Goal 1) Jill Alexander will develop strategies to regulate her emotions and improve her positive self talk Likert rating baseline date 04/12/21: How Easy - 3; How Often - 50% Target Date Goal Was reviewed Status Code Progress towards goal/Likert rating  05/02/23   New Ongoing                      Jill Alexander participated in development of tx plan and provided verbal consent.            Chrissie Noa, PhD        Chrissie Noa, PhD

## 2022-08-26 ENCOUNTER — Other Ambulatory Visit (HOSPITAL_COMMUNITY): Payer: Self-pay

## 2022-08-26 MED ORDER — TOPIRAMATE 50 MG PO TABS
150.0000 mg | ORAL_TABLET | Freq: Every day | ORAL | 0 refills | Status: DC
  Filled 2022-08-26 – 2022-10-29 (×4): qty 270, 90d supply, fill #0

## 2022-08-27 ENCOUNTER — Ambulatory Visit: Payer: Medicaid Other | Admitting: Clinical

## 2022-08-29 ENCOUNTER — Other Ambulatory Visit (HOSPITAL_COMMUNITY): Payer: Self-pay

## 2022-08-29 ENCOUNTER — Other Ambulatory Visit: Payer: Self-pay

## 2022-09-02 ENCOUNTER — Encounter (HOSPITAL_COMMUNITY): Payer: Self-pay

## 2022-09-03 ENCOUNTER — Ambulatory Visit: Payer: Medicaid Other | Admitting: Clinical

## 2022-09-06 ENCOUNTER — Other Ambulatory Visit (HOSPITAL_COMMUNITY): Payer: Self-pay

## 2022-09-06 DIAGNOSIS — K219 Gastro-esophageal reflux disease without esophagitis: Secondary | ICD-10-CM | POA: Diagnosis not present

## 2022-09-06 MED ORDER — OMEPRAZOLE 20 MG PO CPDR
20.0000 mg | DELAYED_RELEASE_CAPSULE | Freq: Every morning | ORAL | 0 refills | Status: DC
  Filled 2022-09-06: qty 90, 90d supply, fill #0

## 2022-09-07 ENCOUNTER — Other Ambulatory Visit: Payer: Self-pay | Admitting: Neurology

## 2022-09-09 ENCOUNTER — Other Ambulatory Visit: Payer: Self-pay

## 2022-09-09 ENCOUNTER — Other Ambulatory Visit (HOSPITAL_COMMUNITY): Payer: Self-pay

## 2022-09-09 MED ORDER — LAMOTRIGINE 200 MG PO TABS
200.0000 mg | ORAL_TABLET | Freq: Two times a day (BID) | ORAL | 2 refills | Status: DC
  Filled 2022-09-09 – 2022-09-25 (×2): qty 60, 30d supply, fill #0
  Filled 2022-10-02 – 2022-10-21 (×2): qty 60, 30d supply, fill #1
  Filled 2022-10-29 – 2022-11-24 (×2): qty 60, 30d supply, fill #2

## 2022-09-10 ENCOUNTER — Ambulatory Visit: Payer: 59 | Admitting: Clinical

## 2022-09-10 DIAGNOSIS — F840 Autistic disorder: Secondary | ICD-10-CM

## 2022-09-10 NOTE — Progress Notes (Signed)
Time: 11:00 am-11:55 am CPT Code: 16109U Diagnosis: F84.0  Jill Alexander was seen in person for therapy. She reflected upon poor self esteem that has especially manifested work. Therapist engaged her in discussion, pointing out cognitive distortions (black or white thinking, catastrophizing, mind reading), and working with her to reframe several of her negative thoughts. She is scheduled to be seen again in one week.  Intake Presenting Problem Jill Alexander presented seeking CBT to help her manage difficulties with emotion regulation and reframing negative thought patterns that contribute to anxiety and worry. Jill Alexander has a diagnosis of Autism Spectrum Disorder (ASD). Symptoms feeling nervous or on edge, difficulty controlling worry, worrying about a variety of things, difficulty relaxing, irritability, fears for the future, feelings of hopelessness, difficulty sleeping, feeling tired, appetite irregularities, poor self-image, passive suicidal ideation without plan or intent (client contracted for safety during initial session) History of Problem  Jill Alexander reported that she moved from [place] with her family in July of 2018. Since that time, she has been seen by a provider in the area for challenges with emotion regulation and anxiety related to her diagnosis of autism spectrum disorder. Jill Alexander lives with her parents. She has a 26 year-old sister who has struggled with similar issues, as well as a brother who is three years older than she is and lives in [place]. Jill Alexander described a long history of therapy and therapeutic programs in order to help her manage these issues, including a partial hospitalization program in December of 2015.  Recent Trigger  Jill Alexander reported that she was seeking a change in pace with regard to her therapeutic treatment in order to ensure continued progress.  Marital and Family Information  Present family concerns/problems: Jill Alexander shared several accounts of disputes with her mother, including her  mother becoming upset with her for sharing worries with her immediately after her mother has woken up, despite her mother's requests that she not do so.  Strengths/resources in the family/friends: Jill Alexander described overall supportive relationships with her parents.  Marital/sexual history patterns: N/A  Family of Origin  Problems in family of origin: None reported.  Family background / ethnic factors: none reported.  No needs/concerns related to ethnicity reported when asked: No  Education/Vocation  Interpersonal concerns/problems: Jill Alexander reported that she has difficulties with social interactions related to her ASD diagnosis, and often perseverates about perceived missteps in both the immediate and distant past.  Personal strengths: Jill Alexander presented as motivated to improve her situation, and with some insight as to what is effective for her.  Military/work problems/concerns: None noted.  Leisure Activities/Daily Functioning  impaired functioning  Legal Status  No Legal Problems  Medical/Nutritional Concerns  unspecified  Comments: Jill Alexander reported that she often perseverates on perceived medical problems, and described herself as a hypochondriac  Religion/Spirituality  Not reported  Other  General Behavior: cooperative  Attire: appropriate  Gait: normal  Motor Activity: normal  Stream of Thought - Productivity: spontaneous  Stream of thought - Progression: normal  Stream of thought - Language: normal  Emotional tone and reactions - Affect: appropriate  Mental trend/Content of thoughts - Perception: normal  Mental trend/Content of thoughts - Orientation: normal  Mental trend/Content of thoughts - Memory: normal  Mental trend/Content of thoughts - General knowledge: consistent with education  Insight: good  Judgment: good  Intelligence: average  Diagnostic Summary  Autism Spectrum Disorder (F84.0)   [facility] Counselor/Therapist Progress Note   Patient ID: Bracie Kneip, MRN: [id number],     Individualized Treatment Plan Strengths:  Enjoys reading, Seeking counseling, In training program through [employer], "I like humor and jokes"  Supports: Her dog, her bestfriend since childhood, her parents, her sister    Goal/Needs for Treatment:  In order of importance to patient 1) Emotional Regulation/Anxiety 2) Improve Positive self talk      Client Statement of Needs: "Find ways of being nicer to myself", cope w/ anxiety and worry    Treatment Level:Outpatient Counseling  Symptoms:Anxiety, Perseverative worry, Negative self talk  Client Treatment Preferences:weekly counseling,  Prefers to be called Jill Alexander    Healthcare consumer's goal for treatment:  Dr. Charlyne Mom will support the patient's ability to achieve the goals identified. Cognitive Behavioral Therapy, Supportive Counseling, Mindfulness and Relaxation Strategies and other evidenced-based practices will be used to promote progress towards healthy functioning.    Healthcare consumer will: Actively participate in therapy, working towards healthy functioning.     *Justification for Continuation/Discontinuation of Goal: R=Revised, O=Ongoing, A=Achieved, D=Discontinued   Goal 1) Jill Alexander will develop strategies to regulate her emotions and improve her positive self talk Likert rating baseline date 04/12/21: How Easy - 3; How Often - 50% Target Date Goal Was reviewed Status Code Progress towards goal/Likert rating  05/02/23   New Ongoing                      Jill Alexander participated in development of tx plan and provided verbal consent.       Chrissie Noa, PhD               Chrissie Noa, PhD

## 2022-09-17 ENCOUNTER — Ambulatory Visit (INDEPENDENT_AMBULATORY_CARE_PROVIDER_SITE_OTHER): Payer: 59 | Admitting: Clinical

## 2022-09-17 DIAGNOSIS — F840 Autistic disorder: Secondary | ICD-10-CM | POA: Diagnosis not present

## 2022-09-17 NOTE — Progress Notes (Signed)
Time: 11:00 am-11:55 am CPT Code: 95284X Diagnosis: F84.0  Jill Alexander was seen in person for therapy. She indicated a plan to talk about her sister's wedding and continuing difficulty with self-compassion. Therapist offered validation and support, and encouraged her to hold in mind her own struggle with accurately conveying her emotional tone as evidence that others, too, may sound more short or upset than they intend. She is scheduled to be seen again in one week.  Intake Presenting Problem Jill Alexander presented seeking CBT to help her manage difficulties with emotion regulation and reframing negative thought patterns that contribute to anxiety and worry. Jill Alexander has a diagnosis of Autism Spectrum Disorder (ASD). Symptoms feeling nervous or on edge, difficulty controlling worry, worrying about a variety of things, difficulty relaxing, irritability, fears for the future, feelings of hopelessness, difficulty sleeping, feeling tired, appetite irregularities, poor self-image, passive suicidal ideation without plan or intent (client contracted for safety during initial session) History of Problem  Jill Alexander reported that she moved from [place] with her family in July of 2018. Since that time, she has been seen by a provider in the area for challenges with emotion regulation and anxiety related to her diagnosis of autism spectrum disorder. Jill Alexander lives with her parents. She has a 28 year-old sister who has struggled with similar issues, as well as a brother who is three years older than she is and lives in [place]. Jill Alexander described a long history of therapy and therapeutic programs in order to help her manage these issues, including a partial hospitalization program in December of 2015.  Recent Trigger  Jill Alexander reported that she was seeking a change in pace with regard to her therapeutic treatment in order to ensure continued progress.  Marital and Family Information  Present family concerns/problems: Jill Alexander shared several  accounts of disputes with her mother, including her mother becoming upset with her for sharing worries with her immediately after her mother has woken up, despite her mother's requests that she not do so.  Strengths/resources in the family/friends: Jill Alexander described overall supportive relationships with her parents.  Marital/sexual history patterns: N/A  Family of Origin  Problems in family of origin: None reported.  Family background / ethnic factors: none reported.  No needs/concerns related to ethnicity reported when asked: No  Education/Vocation  Interpersonal concerns/problems: Jill Alexander reported that she has difficulties with social interactions related to her ASD diagnosis, and often perseverates about perceived missteps in both the immediate and distant past.  Personal strengths: Jill Alexander presented as motivated to improve her situation, and with some insight as to what is effective for her.  Military/work problems/concerns: None noted.  Leisure Activities/Daily Functioning  impaired functioning  Legal Status  No Legal Problems  Medical/Nutritional Concerns  unspecified  Comments: Jill Alexander reported that she often perseverates on perceived medical problems, and described herself as a hypochondriac  Religion/Spirituality  Not reported  Other  General Behavior: cooperative  Attire: appropriate  Gait: normal  Motor Activity: normal  Stream of Thought - Productivity: spontaneous  Stream of thought - Progression: normal  Stream of thought - Language: normal  Emotional tone and reactions - Affect: appropriate  Mental trend/Content of thoughts - Perception: normal  Mental trend/Content of thoughts - Orientation: normal  Mental trend/Content of thoughts - Memory: normal  Mental trend/Content of thoughts - General knowledge: consistent with education  Insight: good  Judgment: good  Intelligence: average  Diagnostic Summary  Autism Spectrum Disorder (F84.0)   [facility]  Counselor/Therapist Progress Note   Patient ID: Jill  Yeshia Alexander, MRN: [id number],     Individualized Treatment Plan Strengths: Enjoys reading, Seeking counseling, In training program through [employer], "I like humor and jokes"  Supports: Her dog, her bestfriend since childhood, her parents, her sister    Goal/Needs for Treatment:  In order of importance to patient 1) Emotional Regulation/Anxiety 2) Improve Positive self talk      Client Statement of Needs: "Find ways of being nicer to myself", cope w/ anxiety and worry    Treatment Level:Outpatient Counseling  Symptoms:Anxiety, Perseverative worry, Negative self talk  Client Treatment Preferences:weekly counseling,  Prefers to be called Jill Alexander    Healthcare consumer's goal for treatment:  Dr. Charlyne Mom will support the patient's ability to achieve the goals identified. Cognitive Behavioral Therapy, Supportive Counseling, Mindfulness and Relaxation Strategies and other evidenced-based practices will be used to promote progress towards healthy functioning.    Healthcare consumer will: Actively participate in therapy, working towards healthy functioning.     *Justification for Continuation/Discontinuation of Goal: R=Revised, O=Ongoing, A=Achieved, D=Discontinued   Goal 1) Jill Alexander will develop strategies to regulate her emotions and improve her positive self talk Likert rating baseline date 04/12/21: How Easy - 3; How Often - 50% Target Date Goal Was reviewed Status Code Progress towards goal/Likert rating  05/02/23   New Ongoing                      Jill Alexander participated in development of tx plan and provided verbal consent.   Jill Noa, PhD               Jill Noa, PhD

## 2022-09-24 ENCOUNTER — Ambulatory Visit (INDEPENDENT_AMBULATORY_CARE_PROVIDER_SITE_OTHER): Payer: 59 | Admitting: Clinical

## 2022-09-24 DIAGNOSIS — F840 Autistic disorder: Secondary | ICD-10-CM | POA: Diagnosis not present

## 2022-09-24 NOTE — Progress Notes (Signed)
Time: 11:00 am-11:55 am CPT Code: 40981X Diagnosis: F84.0  Jill Alexander was seen in person for therapy. She reflected upon the topic of oversharing, and therapist engaged her in a discussion of what boundaries might be in different settings.She is scheduled to be seen again in three weeks, and therapist will reach out as cancellations happen.  Intake Presenting Problem Jill Alexander presented seeking CBT to help her manage difficulties with emotion regulation and reframing negative thought patterns that contribute to anxiety and worry. Jill Alexander has a diagnosis of Autism Spectrum Disorder (ASD). Symptoms feeling nervous or on edge, difficulty controlling worry, worrying about a variety of things, difficulty relaxing, irritability, fears for the future, feelings of hopelessness, difficulty sleeping, feeling tired, appetite irregularities, poor self-image, passive suicidal ideation without plan or intent (client contracted for safety during initial session) History of Problem  Jill Alexander reported that she moved from [place] with her family in July of 2018. Since that time, she has been seen by a provider in the area for challenges with emotion regulation and anxiety related to her diagnosis of autism spectrum disorder. Jill Alexander lives with her parents. She has a 68 year-old sister who has struggled with similar issues, as well as a brother who is three years older than she is and lives in [place]. Jill Alexander described a long history of therapy and therapeutic programs in order to help her manage these issues, including a partial hospitalization program in December of 2015.  Recent Trigger  Jill Alexander reported that she was seeking a change in pace with regard to her therapeutic treatment in order to ensure continued progress.  Marital and Family Information  Present family concerns/problems: Jill Alexander shared several accounts of disputes with her mother, including her mother becoming upset with her for sharing worries with her immediately after  her mother has woken up, despite her mother's requests that she not do so.  Strengths/resources in the family/friends: Jill Alexander described overall supportive relationships with her parents.  Marital/sexual history patterns: N/A  Family of Origin  Problems in family of origin: None reported.  Family background / ethnic factors: none reported.  No needs/concerns related to ethnicity reported when asked: No  Education/Vocation  Interpersonal concerns/problems: Jill Alexander reported that she has difficulties with social interactions related to her ASD diagnosis, and often perseverates about perceived missteps in both the immediate and distant past.  Personal strengths: Jill Alexander presented as motivated to improve her situation, and with some insight as to what is effective for her.  Military/work problems/concerns: None noted.  Leisure Activities/Daily Functioning  impaired functioning  Legal Status  No Legal Problems  Medical/Nutritional Concerns  unspecified  Comments: Jill Alexander reported that she often perseverates on perceived medical problems, and described herself as a hypochondriac  Religion/Spirituality  Not reported  Other  General Behavior: cooperative  Attire: appropriate  Gait: normal  Motor Activity: normal  Stream of Thought - Productivity: spontaneous  Stream of thought - Progression: normal  Stream of thought - Language: normal  Emotional tone and reactions - Affect: appropriate  Mental trend/Content of thoughts - Perception: normal  Mental trend/Content of thoughts - Orientation: normal  Mental trend/Content of thoughts - Memory: normal  Mental trend/Content of thoughts - General knowledge: consistent with education  Insight: good  Judgment: good  Intelligence: average  Diagnostic Summary  Autism Spectrum Disorder (F84.0)   [facility] Counselor/Therapist Progress Note   Patient ID: Jill Alexander, MRN: [id number],     Individualized Treatment Plan Strengths:  Enjoys reading, Seeking counseling, In training program through [employer], "  I like humor and jokes"  Supports: Her dog, her bestfriend since childhood, her parents, her sister    Goal/Needs for Treatment:  In order of importance to patient 1) Emotional Regulation/Anxiety 2) Improve Positive self talk      Client Statement of Needs: "Find ways of being nicer to myself", cope w/ anxiety and worry    Treatment Level:Outpatient Counseling  Symptoms:Anxiety, Perseverative worry, Negative self talk  Client Treatment Preferences:weekly counseling,  Prefers to be called Jill Alexander    Healthcare consumer's goal for treatment:  Dr. Charlyne Mom will support the patient's ability to achieve the goals identified. Cognitive Behavioral Therapy, Supportive Counseling, Mindfulness and Relaxation Strategies and other evidenced-based practices will be used to promote progress towards healthy functioning.    Healthcare consumer will: Actively participate in therapy, working towards healthy functioning.     *Justification for Continuation/Discontinuation of Goal: R=Revised, O=Ongoing, A=Achieved, D=Discontinued   Goal 1) Jill Alexander will develop strategies to regulate her emotions and improve her positive self talk Likert rating baseline date 04/12/21: How Easy - 3; How Often - 50% Target Date Goal Was reviewed Status Code Progress towards goal/Likert rating  05/02/23   New Ongoing                      Jill Alexander participated in development of tx plan and provided verbal consent.     Chrissie Noa, PhD               Chrissie Noa, PhD

## 2022-09-26 ENCOUNTER — Other Ambulatory Visit (HOSPITAL_COMMUNITY): Payer: Self-pay

## 2022-10-01 ENCOUNTER — Other Ambulatory Visit (HOSPITAL_COMMUNITY): Payer: Self-pay

## 2022-10-01 ENCOUNTER — Ambulatory Visit: Payer: 59 | Admitting: Clinical

## 2022-10-01 DIAGNOSIS — F411 Generalized anxiety disorder: Secondary | ICD-10-CM | POA: Diagnosis not present

## 2022-10-01 DIAGNOSIS — Z5181 Encounter for therapeutic drug level monitoring: Secondary | ICD-10-CM | POA: Diagnosis not present

## 2022-10-01 DIAGNOSIS — F331 Major depressive disorder, recurrent, moderate: Secondary | ICD-10-CM | POA: Diagnosis not present

## 2022-10-01 MED ORDER — QELBREE 200 MG PO CP24
200.0000 mg | ORAL_CAPSULE | Freq: Every morning | ORAL | 1 refills | Status: DC
  Filled 2022-10-01 – 2022-11-13 (×2): qty 30, 30d supply, fill #0
  Filled 2023-01-28: qty 30, 30d supply, fill #1

## 2022-10-01 MED ORDER — AUVELITY 45-105 MG PO TBCR
1.0000 | EXTENDED_RELEASE_TABLET | Freq: Two times a day (BID) | ORAL | 2 refills | Status: DC
  Filled 2022-10-01: qty 60, 30d supply, fill #0
  Filled 2022-10-29: qty 60, 30d supply, fill #1
  Filled 2022-12-19: qty 60, 30d supply, fill #2

## 2022-10-02 ENCOUNTER — Other Ambulatory Visit (HOSPITAL_COMMUNITY): Payer: Self-pay

## 2022-10-02 ENCOUNTER — Other Ambulatory Visit: Payer: Self-pay

## 2022-10-03 ENCOUNTER — Ambulatory Visit (INDEPENDENT_AMBULATORY_CARE_PROVIDER_SITE_OTHER): Payer: 59 | Admitting: Clinical

## 2022-10-03 DIAGNOSIS — F840 Autistic disorder: Secondary | ICD-10-CM

## 2022-10-03 NOTE — Progress Notes (Signed)
Time: 2:00 pm-2:55 pm CPT Code: 16109U Diagnosis: F84.0  Jill Alexander was seen remotely using secure video conferencing. She was in a secure location near her home and the therapist was in her office at the time of the appointment. She had some difficulty joining the session, so session began over phone as therapist talked her through technical challenges. After reviewing schedule changes, Jill Alexander shared increasing stress at work, and speculated whether this may be due to having asked for more hours. Therapist suggested considering whether a particular schedule may be more comfortable for her, in which case she can have a conversation with her boss to work out hours that are a better fit. She is scheduled to be seen again in two weeks. Intake Presenting Problem Jill Alexander presented seeking CBT to help her manage difficulties with emotion regulation and reframing negative thought patterns that contribute to anxiety and worry. Jill Alexander has a diagnosis of Autism Spectrum Disorder (ASD). Symptoms feeling nervous or on edge, difficulty controlling worry, worrying about a variety of things, difficulty relaxing, irritability, fears for the future, feelings of hopelessness, difficulty sleeping, feeling tired, appetite irregularities, poor self-image, passive suicidal ideation without plan or intent (client contracted for safety during initial session) History of Problem  Jill Alexander reported that she moved from [place] with her family in July of 2018. Since that time, she has been seen by a provider in the area for challenges with emotion regulation and anxiety related to her diagnosis of autism spectrum disorder. Jill Alexander lives with her parents. She has a 29 year-old sister who has struggled with similar issues, as well as a brother who is three years older than she is and lives in [place]. Jill Alexander described a long history of therapy and therapeutic programs in order to help her manage these issues, including a partial hospitalization  program in December of 2015.  Recent Trigger  Jill Alexander reported that she was seeking a change in pace with regard to her therapeutic treatment in order to ensure continued progress.  Marital and Family Information  Present family concerns/problems: Jill Alexander shared several accounts of disputes with her mother, including her mother becoming upset with her for sharing worries with her immediately after her mother has woken up, despite her mother's requests that she not do so.  Strengths/resources in the family/friends: Jill Alexander described overall supportive relationships with her parents.  Marital/sexual history patterns: N/A  Family of Origin  Problems in family of origin: None reported.  Family background / ethnic factors: none reported.  No needs/concerns related to ethnicity reported when asked: No  Education/Vocation  Interpersonal concerns/problems: Jill Alexander reported that she has difficulties with social interactions related to her ASD diagnosis, and often perseverates about perceived missteps in both the immediate and distant past.  Personal strengths: Jill Alexander presented as motivated to improve her situation, and with some insight as to what is effective for her.  Military/work problems/concerns: None noted.  Leisure Activities/Daily Functioning  impaired functioning  Legal Status  No Legal Problems  Medical/Nutritional Concerns  unspecified  Comments: Jill Alexander reported that she often perseverates on perceived medical problems, and described herself as a hypochondriac  Religion/Spirituality  Not reported  Other  General Behavior: cooperative  Attire: appropriate  Gait: normal  Motor Activity: normal  Stream of Thought - Productivity: spontaneous  Stream of thought - Progression: normal  Stream of thought - Language: normal  Emotional tone and reactions - Affect: appropriate  Mental trend/Content of thoughts - Perception: normal  Mental trend/Content of thoughts - Orientation: normal  Mental  trend/Content of thoughts - Memory: normal  Mental trend/Content of thoughts - General knowledge: consistent with education  Insight: good  Judgment: good  Intelligence: average  Diagnostic Summary  Autism Spectrum Disorder (F84.0)   [facility] Counselor/Therapist Progress Note   Patient ID: Jill Alexander, MRN: [id number],     Individualized Treatment Plan Strengths: Enjoys reading, Seeking counseling, In training program through [employer], "I like humor and jokes"  Supports: Her dog, her bestfriend since childhood, her parents, her sister    Goal/Needs for Treatment:  In order of importance to patient 1) Emotional Regulation/Anxiety 2) Improve Positive self talk      Client Statement of Needs: "Find ways of being nicer to myself", cope w/ anxiety and worry    Treatment Level:Outpatient Counseling  Symptoms:Anxiety, Perseverative worry, Negative self talk  Client Treatment Preferences:weekly counseling,  Prefers to be called Jill Alexander    Healthcare consumer's goal for treatment:  Dr. Charlyne Mom will support the patient's ability to achieve the goals identified. Cognitive Behavioral Therapy, Supportive Counseling, Mindfulness and Relaxation Strategies and other evidenced-based practices will be used to promote progress towards healthy functioning.    Healthcare consumer will: Actively participate in therapy, working towards healthy functioning.     *Justification for Continuation/Discontinuation of Goal: R=Revised, O=Ongoing, A=Achieved, D=Discontinued   Goal 1) Jill Alexander will develop strategies to regulate her emotions and improve her positive self talk Likert rating baseline date 04/12/21: How Easy - 3; How Often - 50% Target Date Goal Was reviewed Status Code Progress towards goal/Likert rating  05/02/23   New Ongoing                      Jill Alexander participated in development of tx plan and provided verbal consent.     Chrissie Noa,  PhD               Chrissie Noa, PhD               Chrissie Noa, PhD

## 2022-10-07 ENCOUNTER — Other Ambulatory Visit (HOSPITAL_COMMUNITY): Payer: Self-pay

## 2022-10-07 ENCOUNTER — Other Ambulatory Visit: Payer: Self-pay

## 2022-10-08 ENCOUNTER — Ambulatory Visit: Payer: 59 | Admitting: Clinical

## 2022-10-10 ENCOUNTER — Telehealth: Payer: Self-pay | Admitting: Neurology

## 2022-10-10 ENCOUNTER — Telehealth (HOSPITAL_COMMUNITY): Payer: 59 | Admitting: Psychiatry

## 2022-10-10 ENCOUNTER — Other Ambulatory Visit (HOSPITAL_COMMUNITY): Payer: Self-pay

## 2022-10-10 NOTE — Telephone Encounter (Signed)
Pt is requesting a refill for rizatriptan (MAXALT) 10 MG tablet .  Pharmacy: [facility] [place]

## 2022-10-10 NOTE — Telephone Encounter (Signed)
Called patient an in formed her that I have called the pharmacy and she has 8 refills on file. I mention her insurance states she nine days to soon for refill and she has to wait till June 22. Pt verbalized understanding. Pt had no questions at this time but was encouraged to call back if questions arise.

## 2022-10-15 ENCOUNTER — Encounter: Payer: Self-pay | Admitting: Neurology

## 2022-10-15 ENCOUNTER — Other Ambulatory Visit (HOSPITAL_COMMUNITY): Payer: Self-pay

## 2022-10-15 ENCOUNTER — Ambulatory Visit (INDEPENDENT_AMBULATORY_CARE_PROVIDER_SITE_OTHER): Payer: 59 | Admitting: Clinical

## 2022-10-15 DIAGNOSIS — F840 Autistic disorder: Secondary | ICD-10-CM | POA: Diagnosis not present

## 2022-10-15 DIAGNOSIS — R4701 Aphasia: Secondary | ICD-10-CM

## 2022-10-15 MED ORDER — ATOMOXETINE HCL 25 MG PO CAPS
25.0000 mg | ORAL_CAPSULE | Freq: Every day | ORAL | 1 refills | Status: DC
  Filled 2022-10-15: qty 30, 30d supply, fill #0
  Filled 2023-01-28: qty 30, 30d supply, fill #1

## 2022-10-15 NOTE — Progress Notes (Signed)
Time: 11:00 am-11:55 am CPT Code: 16109U Diagnosis: F84.0  Jill Alexander was seen in person for therapy. She reported that she had overslept and had nearly not made it to her session on time, and as a result had not taken her medication that morning. Jill Alexander's processing appeared especially slow, and she especially struggled with word retrieval without session. She reported that she has started seeing a new provider for medication management, and had been placed on a low dose of stimulant medication. She expressed continued frustrations with work, and reflected upon an upcoming [place]. Therapist pointed out maladaptive cognitions and offered alternate perspectives. She is scheduled to be seen again in one week.  Intake Presenting Problem Jill Alexander presented seeking CBT to help her manage difficulties with emotion regulation and reframing negative thought patterns that contribute to anxiety and worry. Jill Alexander has a diagnosis of Autism Spectrum Disorder (ASD). Symptoms feeling nervous or on edge, difficulty controlling worry, worrying about a variety of things, difficulty relaxing, irritability, fears for the future, feelings of hopelessness, difficulty sleeping, feeling tired, appetite irregularities, poor self-image, passive suicidal ideation without plan or intent (client contracted for safety during initial session) History of Problem  Jill Alexander reported that she moved from [place] with her family in July of 2018. Since that time, she has been seen by a provider in the area for challenges with emotion regulation and anxiety related to her diagnosis of autism spectrum disorder. Jill Alexander lives with her parents. She has a 1 year-old sister who has struggled with similar issues, as well as a brother who is three years older than she is and lives in [place]. Jill Alexander described a long history of therapy and therapeutic programs in order to help her manage these issues, including a partial hospitalization program in  December of 2015.  Recent Trigger  Jill Alexander reported that she was seeking a change in pace with regard to her therapeutic treatment in order to ensure continued progress.  Marital and Family Information  Present family concerns/problems: Jill Alexander shared several accounts of disputes with her mother, including her mother becoming upset with her for sharing worries with her immediately after her mother has woken up, despite her mother's requests that she not do so.  Strengths/resources in the family/friends: Jill Alexander described overall supportive relationships with her parents.  Marital/sexual history patterns: N/A  Family of Origin  Problems in family of origin: None reported.  Family background / ethnic factors: none reported.  No needs/concerns related to ethnicity reported when asked: No  Education/Vocation  Interpersonal concerns/problems: Jill Alexander reported that she has difficulties with social interactions related to her ASD diagnosis, and often perseverates about perceived missteps in both the immediate and distant past.  Personal strengths: Jill Alexander presented as motivated to improve her situation, and with some insight as to what is effective for her.  Military/work problems/concerns: None noted.  Leisure Activities/Daily Functioning  impaired functioning  Legal Status  No Legal Problems  Medical/Nutritional Concerns  unspecified  Comments: Jill Alexander reported that she often perseverates on perceived medical problems, and described herself as a hypochondriac  Religion/Spirituality  Not reported  Other  General Behavior: cooperative  Attire: appropriate  Gait: normal  Motor Activity: normal  Stream of Thought - Productivity: spontaneous  Stream of thought - Progression: normal  Stream of thought - Language: normal  Emotional tone and reactions - Affect: appropriate  Mental trend/Content of thoughts - Perception: normal  Mental trend/Content of thoughts - Orientation: normal  Mental trend/Content of  thoughts - Memory: normal  Mental trend/Content of thoughts - General knowledge: consistent with education  Insight: good  Judgment: good  Intelligence: average  Diagnostic Summary  Autism Spectrum Disorder (F84.0)   [facility] Counselor/Therapist Progress Note   Patient ID: Stefan Blehm, MRN: [id number],     Individualized Treatment Plan Strengths: Enjoys reading, Seeking counseling, In training program through [employer], "I like humor and jokes"  Supports: Her dog, her bestfriend since childhood, her parents, her sister    Goal/Needs for Treatment:  In order of importance to patient 1) Emotional Regulation/Anxiety 2) Improve Positive self talk      Client Statement of Needs: "Find ways of being nicer to myself", cope w/ anxiety and worry    Treatment Level:Outpatient Counseling  Symptoms:Anxiety, Perseverative worry, Negative self talk  Client Treatment Preferences:weekly counseling,  Prefers to be called Jill Alexander    Healthcare consumer's goal for treatment:  Dr. Charlyne Mom will support the patient's ability to achieve the goals identified. Cognitive Behavioral Therapy, Supportive Counseling, Mindfulness and Relaxation Strategies and other evidenced-based practices will be used to promote progress towards healthy functioning.    Healthcare consumer will: Actively participate in therapy, working towards healthy functioning.     *Justification for Continuation/Discontinuation of Goal: R=Revised, O=Ongoing, A=Achieved, D=Discontinued   Goal 1) Jill Alexander will develop strategies to regulate her emotions and improve her positive self talk Likert rating baseline date 04/12/21: How Easy - 3; How Often - 50% Target Date Goal Was reviewed Status Code Progress towards goal/Likert rating  05/02/23   New Ongoing                      Jill Alexander participated in development of tx plan and provided verbal consent.     Chrissie Noa,  PhD               Chrissie Noa, PhD               Chrissie Noa, PhD               Chrissie Noa, PhD

## 2022-10-16 ENCOUNTER — Other Ambulatory Visit (HOSPITAL_COMMUNITY): Payer: Self-pay

## 2022-10-17 ENCOUNTER — Ambulatory Visit: Payer: 59 | Attending: Neurology | Admitting: Speech Pathology

## 2022-10-17 ENCOUNTER — Other Ambulatory Visit: Payer: Self-pay

## 2022-10-17 DIAGNOSIS — R41841 Cognitive communication deficit: Secondary | ICD-10-CM | POA: Diagnosis present

## 2022-10-17 DIAGNOSIS — R4701 Aphasia: Secondary | ICD-10-CM | POA: Diagnosis not present

## 2022-10-17 DIAGNOSIS — R4789 Other speech disturbances: Secondary | ICD-10-CM | POA: Insufficient documentation

## 2022-10-17 NOTE — Therapy (Signed)
OUTPATIENT SPEECH LANGUAGE PATHOLOGY EVALUATION   Patient Name: Jill Alexander MRN: [id number] DOB:1995/06/14, 27 y.o., female Today's Date: 10/17/2022  PCP: Lexine Baton REFERRING PROVIDER: Melvyn Novas, MD  END OF SESSION:  End of Session - 10/17/22 1324     Visit Number 1    Number of Visits 13    Date for SLP Re-Evaluation 12/05/22    Authorization Type CHEP    SLP Start Time 1230    SLP Stop Time  1319    SLP Time Calculation (min) 49 min    Activity Tolerance Patient tolerated treatment well             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   Past Surgical History:  Procedure Laterality Date   none     Patient Active Problem List   Diagnosis Date Noted   Excessive daytime sleepiness 03/18/2022   Non-restorative sleep 03/18/2022   Chronic fatigue and malaise 03/18/2022   Loud snoring 03/18/2022   Chiari malformation type I (HCC) 03/18/2022   GAD (generalized anxiety disorder) 02/06/2022   MDD (major depressive disorder), recurrent severe, 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   Women's annual routine gynecological examination 12/25/2021   Encounter for birth control pills maintenance 11/22/2021   History of Chiari malformation 09/26/2021   Urinary urgency 09/26/2021   Common migraine with intractable migraine 07/01/2019   Intermittent headache 10/26/2018   Epigastric pain 10/26/2018   Gastroesophageal reflux disease 10/26/2018   Bipolar 2 disorder (HCC) 06/02/2018   Panic attacks 06/02/2018   Autism spectrum disorder 05/06/2018   Anxiety and depression 05/06/2018   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   Seizures (HCC) 04/09/2017   Tremor, 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   PAIN:  Are you having pain? No  FALLS: Has patient fallen in last 6 months?  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   PATIENT GOALS: "to remember what people say."  OBJECTIVE:   DIAGNOSTIC FINDINGS:07/24/22 MR BRAIN IMPRESSION: MRI scan of the brain with and without contrast showing no significant abnormalities in the brain.  Incidental finding of type I Arnold-Chiari malformation with crowding of the craniovertebral junction but no associated brainstem deformities or syringomyelia is noted.    COGNITION: Overall cognitive status: Impaired Areas of impairment:  Attention: Impaired: Focused, Alternating, Divided Memory: Impaired: Working Short term Long term Designer, fashion/clothing function: Impaired: Initiation and Slow processing Functional deficits: Patient presents with limited word finding, memory and endorses attention issues.   AUDITORY COMPREHENSION: Overall auditory comprehension: Appears intact YES/NO questions: Appears intact Following directions: Appears intact Conversation: Simple, Complex, and patient  requires direct language. Interfering components: attention, anxiety, and processing speed Effective technique: extra  processing time, repetition/stressing words, and visual/gestural cues  READING COMPREHENSION: Intact  EXPRESSION: verbal  VERBAL EXPRESSION: Level of generative/spontaneous verbalization: word, phrase, sentence, and conversation Interfering components: attention Effective technique: open ended questions, semantic cues, sentence completion, phonemic cues, and written cues Non-verbal means of communication: N/A Global Coherence Discourse score (Leaman & Ty Hilts, 2020): 2.1 Comments: Patient demonstrating frequent halting and use of filler words, which she endorses is 2/2 inability to generate needed word. Naming marked by prolonged pause, occasional self-corrected semantic paraphasia.   MOTOR SPEECH: Overall motor speech: Appears intact Level of impairment: Word, Phrase, Sentence, and Conversation Respiration: clavicular breathing, speaking on residual capacity, and patient presented with anxiety.  Phonation: normal Resonance: WFL Articulation: Appears intact Intelligibility: Intelligible  STANDARDIZED ASSESSMENTS: [place] NAMING TEST: 40/40 x4 semantic paraphasias with self-correction. Usual extended pause prior to naming.   PATIENT REPORTED OUTCOME MEASURES (PROM): MMQ Memory Mistakes: 52 "All the time:" misplace commonly used items, forget an appointment, forget what about to do, forget to run an errand, difficulty with word finding, forgetting what about to say in conversation "Often:" forgetting names, leaving things behind, forget to pass on message, forget to buy something needed "Sometimes:" recalling details read, forgetting medication, retelling story or joke  TODAY'S TREATMENT:                                                                                                                                         DATE:   10/17/22: Patient demonstrates with anxiety as demonstrated by shaking, and thoracic breathing. Patient states struggles with memory, attention, and word  finding. Unstructured discourse patient presented with staggered, tangential speech, she benefited from simple, direct questions. Verbal cues required for topic maintenance required to facilitate evaluation. Education provided on side affects of Topamax as it notes side effects for challenges in attention, memory, and word finding. Patient will reach out to her neurologist to see if a different medication will work best for her. Education provided on evaluation results and SLP's recommendations. Initiated training re: use of external cognitive aids. Pt verbalizes agreement with POC, all questions answered to satisfaction.   PATIENT EDUCATION: Education details: See above  Person educated: Patient Education method: Explanation Education comprehension: verbalized understanding   GOALS: Goals reviewed with patient? Yes  SHORT TERM GOALS: Target date: 11/14/2022  Patient will successfully use external aids to address x3 functional memory challenges with min-A Baseline: Goal status: INITIAL  2.  Patient will demonstrate word finding compensation strategies with 80% accuracy during structured tasks with occasional min-A, Baseline:  Goal status: INITIAL  3.  Pt will demonstrate processing and working memory strategies during role play, resulting in recall of spoken information with 80% accuracy  Baseline:  Goal status: INITIAL   LONG TERM GOALS: Target date: 12/05/2022  Pt will verbalize x4 examples of carryover of  memory strategies and compensations  Baseline:  Goal status: INITIAL  2.  Pt will report reduced memory mistakes via PROM by dc  Baseline: 53 Goal status: INITIAL  3.  Pt will participate in structured 15 minute generative language task with use of anomia and discourse strategies/compensations PRN, with global discourse score of 2.8 or greater  Baseline: 2.1 Goal status: INITIAL  ASSESSMENT:  CLINICAL IMPRESSION: Patient is a 27 y.o. F who was seen today for cognitive  linguistic and aphasia evaluation upon referral from Dr. Vickey Huger (neurology). Evaluation reveals moderate challenges in verbal expression, c/b halting speech 2/2 word finding with usual mazing and decreased topic maintenance. Deficits result in decreased discourse cohesion. Communication partner required to maintain extended attention and following tangential speech to determine topic and important aspects of pt's intended message. Pt's endorses memory and attention challenges first noted in 2022 but worsening within the past year. Memory mistakes questionnaire facilitative in identifying functional needs which can be addressed with identification and implementation of appropriate cognitive aids/strategies. Pt reports challenges in schedule management, recalling information in conversations, and challenges performing vocational duties. Pt would benefit from skilled ST to address aforementioned deficits to improve QoL, enhance communication efficacy, and maximize independence.   OBJECTIVE IMPAIRMENTS: include attention, memory, executive functioning, and expressive language. These impairments are limiting patient from managing appointments, managing finances, household responsibilities, effectively communicating at home and in community, and successful participation in vocational duties . Factors affecting potential to achieve goals and functional outcome are co-morbidities and previous level of function. Patient will benefit from skilled SLP services to address above impairments and improve overall function.  REHAB POTENTIAL: Good  PLAN:  SLP FREQUENCY: 2x/week  SLP DURATION: 7 weeks   PLANNED INTERVENTIONS: Cognitive reorganization, Internal/external aids, Oral motor exercises, Functional tasks, Multimodal communication approach, SLP instruction and feedback, and Compensatory strategies    Maia Breslow, CCC-SLP 10/17/2022, 1:24 PM

## 2022-10-21 ENCOUNTER — Other Ambulatory Visit (HOSPITAL_COMMUNITY): Payer: Self-pay

## 2022-10-22 ENCOUNTER — Ambulatory Visit (INDEPENDENT_AMBULATORY_CARE_PROVIDER_SITE_OTHER): Payer: 59 | Admitting: Clinical

## 2022-10-22 DIAGNOSIS — F840 Autistic disorder: Secondary | ICD-10-CM

## 2022-10-22 NOTE — Progress Notes (Signed)
Time: 11:07 am-11:55 am CPT Code: 16109U Diagnosis: F84.0  Jill Alexander was seen remotely using secure video conferencing. She was in her home and the therapist was in her office at the time of the appointment. Client is aware of the risks of telehealth and consented to a virtual visit. Session focused on an incident that had occurred at work, during which Jill Alexander had become upset. Therapist suggested discussing factors that had contributed to her becoming upset at work and possible coping strategies she can use. She is scheduled to be seen again in two weeks.  Intake Presenting Problem Jill Alexander presented seeking CBT to help her manage difficulties with emotion regulation and reframing negative thought patterns that contribute to anxiety and worry. Jill Alexander has a diagnosis of Autism Spectrum Disorder (ASD). Symptoms feeling nervous or on edge, difficulty controlling worry, worrying about a variety of things, difficulty relaxing, irritability, fears for the future, feelings of hopelessness, difficulty sleeping, feeling tired, appetite irregularities, poor self-image, passive suicidal ideation without plan or intent (client contracted for safety during initial session) History of Problem  Jill Alexander reported that she moved from [place] with her family in July of 2018. Since that time, she has been seen by a provider in the area for challenges with emotion regulation and anxiety related to her diagnosis of autism spectrum disorder. Jill Alexander lives with her parents. She has a 31 year-old sister who has struggled with similar issues, as well as a brother who is three years older than she is and lives in [place]. Jill Alexander described a long history of therapy and therapeutic programs in order to help her manage these issues, including a partial hospitalization program in December of 2015.  Recent Trigger  Jill Alexander reported that she was seeking a change in pace with regard to her therapeutic treatment in order to ensure continued progress.   Marital and Family Information  Present family concerns/problems: Jill Alexander shared several accounts of disputes with her mother, including her mother becoming upset with her for sharing worries with her immediately after her mother has woken up, despite her mother's requests that she not do so.  Strengths/resources in the family/friends: Jill Alexander described overall supportive relationships with her parents.  Marital/sexual history patterns: N/A  Family of Origin  Problems in family of origin: None reported.  Family background / ethnic factors: none reported.  No needs/concerns related to ethnicity reported when asked: No  Education/Vocation  Interpersonal concerns/problems: Jill Alexander reported that she has difficulties with social interactions related to her ASD diagnosis, and often perseverates about perceived missteps in both the immediate and distant past.  Personal strengths: Jill Alexander presented as motivated to improve her situation, and with some insight as to what is effective for her.  Military/work problems/concerns: None noted.  Leisure Activities/Daily Functioning  impaired functioning  Legal Status  No Legal Problems  Medical/Nutritional Concerns  unspecified  Comments: Jill Alexander reported that she often perseverates on perceived medical problems, and described herself as a hypochondriac  Religion/Spirituality  Not reported  Other  General Behavior: cooperative  Attire: appropriate  Gait: normal  Motor Activity: normal  Stream of Thought - Productivity: spontaneous  Stream of thought - Progression: normal  Stream of thought - Language: normal  Emotional tone and reactions - Affect: appropriate  Mental trend/Content of thoughts - Perception: normal  Mental trend/Content of thoughts - Orientation: normal  Mental trend/Content of thoughts - Memory: normal  Mental trend/Content of thoughts - General knowledge: consistent with education  Insight: good  Judgment: good  Intelligence: average   Diagnostic  Summary  Autism Spectrum Disorder (F84.0)   [facility] Counselor/Therapist Progress Note   Patient ID: Rhaya Coale, MRN: [id number],     Individualized Treatment Plan Strengths: Enjoys reading, Seeking counseling, In training program through [employer], "I like humor and jokes"  Supports: Her dog, her bestfriend since childhood, her parents, her sister    Goal/Needs for Treatment:  In order of importance to patient 1) Emotional Regulation/Anxiety 2) Improve Positive self talk      Client Statement of Needs: "Find ways of being nicer to myself", cope w/ anxiety and worry    Treatment Level:Outpatient Counseling  Symptoms:Anxiety, Perseverative worry, Negative self talk  Client Treatment Preferences:weekly counseling,  Prefers to be called Jill Alexander    Healthcare consumer's goal for treatment:  Dr. Charlyne Mom will support the patient's ability to achieve the goals identified. Cognitive Behavioral Therapy, Supportive Counseling, Mindfulness and Relaxation Strategies and other evidenced-based practices will be used to promote progress towards healthy functioning.    Healthcare consumer will: Actively participate in therapy, working towards healthy functioning.     *Justification for Continuation/Discontinuation of Goal: R=Revised, O=Ongoing, A=Achieved, D=Discontinued   Goal 1) Jill Alexander will develop strategies to regulate her emotions and improve her positive self talk Likert rating baseline date 04/12/21: How Easy - 3; How Often - 50% Target Date Goal Was reviewed Status Code Progress towards goal/Likert rating  05/02/23   New Ongoing                      Jill Alexander participated in development of tx plan and provided verbal consent.     Chrissie Noa, PhD               Chrissie Noa, PhD               Chrissie Noa, PhD               Chrissie Noa, PhDTime: 11:00 am-11:55 am CPT Code:  [id number] Diagnosis: F84.0  Jill Alexander was seen in person for therapy. She reported that she had overslept and had nearly not made it to her session on time, and as a result had not taken her medication that morning. Jill Alexander's processing appeared especially slow, and she especially struggled with word retrieval without session. She reported that she has started seeing a new provider for medication management, and had been placed on a low dose of stimulant medication. She expressed continued frustrations with work, and reflected upon an upcoming trip [place]. Therapist pointed out maladaptive cognitions and offered alternate perspectives. She is scheduled to be seen again in one week.  Intake Presenting Problem Jill Alexander presented seeking CBT to help her manage difficulties with emotion regulation and reframing negative thought patterns that contribute to anxiety and worry. Jill Alexander has a diagnosis of Autism Spectrum Disorder (ASD). Symptoms feeling nervous or on edge, difficulty controlling worry, worrying about a variety of things, difficulty relaxing, irritability, fears for the future, feelings of hopelessness, difficulty sleeping, feeling tired, appetite irregularities, poor self-image, passive suicidal ideation without plan or intent (client contracted for safety during initial session) History of Problem  Jill Alexander reported that she moved from [place] with her family in July of 2018. Since that time, she has been seen by a provider in the area for challenges with emotion regulation and anxiety related to her diagnosis of autism spectrum disorder. Jill Alexander lives with her parents. She has a 32 year-old sister who has struggled with similar issues, as  well as a brother who is three years older than she is and lives in [place]. Jill Alexander described a long history of therapy and therapeutic programs in order to help her manage these issues, including a partial hospitalization program in December of 2015.  Recent Trigger   Jill Alexander reported that she was seeking a change in pace with regard to her therapeutic treatment in order to ensure continued progress.  Marital and Family Information  Present family concerns/problems: Jill Alexander shared several accounts of disputes with her mother, including her mother becoming upset with her for sharing worries with her immediately after her mother has woken up, despite her mother's requests that she not do so.  Strengths/resources in the family/friends: Jill Alexander described overall supportive relationships with her parents.  Marital/sexual history patterns: N/A  Family of Origin  Problems in family of origin: None reported.  Family background / ethnic factors: none reported.  No needs/concerns related to ethnicity reported when asked: No  Education/Vocation  Interpersonal concerns/problems: Jill Alexander reported that she has difficulties with social interactions related to her ASD diagnosis, and often perseverates about perceived missteps in both the immediate and distant past.  Personal strengths: Jill Alexander presented as motivated to improve her situation, and with some insight as to what is effective for her.  Military/work problems/concerns: None noted.  Leisure Activities/Daily Functioning  impaired functioning  Legal Status  No Legal Problems  Medical/Nutritional Concerns  unspecified  Comments: Jill Alexander reported that she often perseverates on perceived medical problems, and described herself as a hypochondriac  Religion/Spirituality  Not reported  Other  General Behavior: cooperative  Attire: appropriate  Gait: normal  Motor Activity: normal  Stream of Thought - Productivity: spontaneous  Stream of thought - Progression: normal  Stream of thought - Language: normal  Emotional tone and reactions - Affect: appropriate  Mental trend/Content of thoughts - Perception: normal  Mental trend/Content of thoughts - Orientation: normal  Mental trend/Content of thoughts - Memory: normal  Mental  trend/Content of thoughts - General knowledge: consistent with education  Insight: good  Judgment: good  Intelligence: average  Diagnostic Summary  Autism Spectrum Disorder (F84.0)   [facility] Counselor/Therapist Progress Note   Patient ID: Jill Alexander, MRN: [id number],     Individualized Treatment Plan Strengths: Enjoys reading, Seeking counseling, In training program through [employer], "I like humor and jokes"  Supports: Her dog, her bestfriend since childhood, her parents, her sister    Goal/Needs for Treatment:  In order of importance to patient 1) Emotional Regulation/Anxiety 2) Improve Positive self talk      Client Statement of Needs: "Find ways of being nicer to myself", cope w/ anxiety and worry    Treatment Level:Outpatient Counseling  Symptoms:Anxiety, Perseverative worry, Negative self talk  Client Treatment Preferences:weekly counseling,  Prefers to be called Jill Alexander    Healthcare consumer's goal for treatment:  Dr. Charlyne Mom will support the patient's ability to achieve the goals identified. Cognitive Behavioral Therapy, Supportive Counseling, Mindfulness and Relaxation Strategies and other evidenced-based practices will be used to promote progress towards healthy functioning.    Healthcare consumer will: Actively participate in therapy, working towards healthy functioning.     *Justification for Continuation/Discontinuation of Goal: R=Revised, O=Ongoing, A=Achieved, D=Discontinued   Goal 1) Jill Alexander will develop strategies to regulate her emotions and improve her positive self talk Likert rating baseline date 04/12/21: How Easy - 3; How Often - 50% Target Date Goal Was reviewed Status Code Progress towards goal/Likert rating  05/02/23   New Ongoing  Jill Alexander participated in development of tx plan and provided verbal consent.     Chrissie Noa, PhD               Chrissie Noa,  PhD               Chrissie Noa, PhD               Chrissie Noa, PhD               Chrissie Noa, PhD

## 2022-10-23 ENCOUNTER — Ambulatory Visit: Payer: 59 | Admitting: Speech Pathology

## 2022-10-23 ENCOUNTER — Other Ambulatory Visit (HOSPITAL_COMMUNITY): Payer: Self-pay

## 2022-10-23 DIAGNOSIS — R41841 Cognitive communication deficit: Secondary | ICD-10-CM | POA: Diagnosis not present

## 2022-10-23 NOTE — Patient Instructions (Signed)
Wynelle LinkSheral Flow. [place]. Wed Thurs.  Fri. Sat.

## 2022-10-23 NOTE — Therapy (Signed)
OUTPATIENT SPEECH LANGUAGE PATHOLOGY TREATMENT   Patient Name: Jill Alexander MRN: [id number] DOB:01-Jul-1995, 27 y.o., female Today's Date: 10/23/2022  PCP: Lexine Baton REFERRING PROVIDER: Melvyn Novas, MD  END OF SESSION:  End of Session - 10/23/22 1403     Visit Number 2    Number of Visits 13    Date for SLP Re-Evaluation 12/05/22    Authorization Type CHEP    SLP Start Time 1403    SLP Stop Time  1448    SLP Time Calculation (min) 45 min    Activity Tolerance Patient tolerated treatment well              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   Past Surgical History:  Procedure Laterality Date   none     Patient Active Problem List   Diagnosis Date Noted   Excessive daytime sleepiness 03/18/2022   Non-restorative sleep 03/18/2022   Chronic fatigue and malaise 03/18/2022   Loud snoring 03/18/2022   Chiari malformation type I (HCC) 03/18/2022   GAD (generalized anxiety disorder) 02/06/2022   MDD (major depressive disorder), recurrent severe, 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   Women's annual routine gynecological examination 12/25/2021   Encounter for birth control pills maintenance 11/22/2021   History of Chiari malformation 09/26/2021   Urinary urgency 09/26/2021   Common migraine with intractable migraine 07/01/2019   Intermittent headache 10/26/2018   Epigastric pain 10/26/2018   Gastroesophageal reflux disease 10/26/2018   Bipolar 2 disorder (HCC) 06/02/2018   Panic attacks 06/02/2018   Autism spectrum disorder 05/06/2018   Anxiety and depression 05/06/2018   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   Seizures (HCC) 04/09/2017   Tremor, 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   PAIN:  Are you having pain? No  FALLS: Has patient fallen in last 6 months?  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   PATIENT GOALS: "to remember what people say."  OBJECTIVE:   DIAGNOSTIC FINDINGS:07/24/22 MR BRAIN IMPRESSION: MRI scan of the brain with and without contrast showing no significant abnormalities in the brain.  Incidental finding of type I Arnold-Chiari malformation with crowding of the craniovertebral junction but no associated brainstem deformities or syringomyelia is noted.    COGNITION: Overall cognitive status: Impaired Areas of impairment:  Attention: Impaired: Focused, Alternating, Divided Memory: Impaired: Working Short term Long term Designer, fashion/clothing function: Impaired: Initiation and Slow processing Functional deficits: Patient presents with limited word finding, memory and endorses attention issues.   AUDITORY COMPREHENSION: Overall auditory comprehension: Appears intact YES/NO questions: Appears intact Following directions: Appears intact Conversation: Simple, Complex, and  patient requires direct language. Interfering components: attention, anxiety, and processing speed Effective technique: extra processing  time, repetition/stressing words, and visual/gestural cues  READING COMPREHENSION: Intact  EXPRESSION: verbal  VERBAL EXPRESSION: Level of generative/spontaneous verbalization: word, phrase, sentence, and conversation Interfering components: attention Effective technique: open ended questions, semantic cues, sentence completion, phonemic cues, and written cues Non-verbal means of communication: N/A Global Coherence Discourse score (Leaman & Ty Hilts, 2020): 2.1 Comments: Patient demonstrating frequent halting and use of filler words, which she endorses is 2/2 inability to generate needed word. Naming marked by prolonged pause, occasional self-corrected semantic paraphasia.   MOTOR SPEECH: Overall motor speech: Appears intact Level of impairment: Word, Phrase, Sentence, and Conversation Respiration: clavicular breathing, speaking on residual capacity, and patient presented with anxiety.  Phonation: normal Resonance: WFL Articulation: Appears intact Intelligibility: Intelligible  STANDARDIZED ASSESSMENTS: [place] NAMING TEST: 40/40 x4 semantic paraphasias with self-correction. Usual extended pause prior to naming.   PATIENT REPORTED OUTCOME MEASURES (PROM): MMQ Memory Mistakes: 41 "All the time:" misplace commonly used items, forget an appointment, forget what about to do, forget to run an errand, difficulty with word finding, forgetting what about to say in conversation "Often:" forgetting names, leaving things behind, forget to pass on message, forget to buy something needed "Sometimes:" recalling details read, forgetting medication, retelling story or joke  TODAY'S TREATMENT:                                                                                                                                         DATE:   10/23/22: SLP targeted remembering to take medication with external memory aids. Patient states that her mother puts her pills in the container, but that she often  forgets to take them, and does not just her memory if she did take them. Patient endorses that she will forget when she does not pay attention and "feels like a robot" when completing tasks.SLP guided putting repetitive alarms on patients phone as well as a check in sheet to keep by the medication for additional accountability.  Education given on different external and internal memory aids, and questions answered to patient satisfaction. Patient's speech continues to be tangential during the session, and she needs direct, explicit language. Patient states frustration with work, and wants to improve. She is encouraged to ask her boss for feedback.   10/17/22: Patient demonstrates with anxiety as demonstrated by shaking, and thoracic breathing. Patient states struggles with memory, attention, and word finding. Unstructured discourse patient presented with staggered, tangential speech, she benefited from simple, direct questions. Verbal cues required for topic maintenance required to facilitate evaluation. Education provided on side affects of Topamax as it notes side effects for challenges in attention, memory, and word finding. Patient will reach out to her neurologist to see if a different medication will work best for her. Education provided on evaluation results and SLP's recommendations. Initiated training re: use of external  cognitive aids. Pt verbalizes agreement with POC, all questions answered to satisfaction.   PATIENT EDUCATION: Education details: See above  Person educated: Patient Education method: Explanation Education comprehension: verbalized understanding   GOALS: Goals reviewed with patient? Yes  SHORT TERM GOALS: Target date: 11/14/2022  Patient will successfully use external aids to address x3 functional memory challenges with min-A Baseline: Goal status: IN PROGRESS  2.  Patient will demonstrate word finding compensation strategies with 80% accuracy during structured tasks with  occasional min-A, Baseline:  Goal status: IN PROGRESS  3.  Pt will demonstrate processing and working memory strategies during role play, resulting in recall of spoken information with 80% accuracy  Baseline:  Goal status: IN PROGRESS   LONG TERM GOALS: Target date: 12/05/2022  Pt will verbalize x4 examples of carryover of memory strategies and compensations  Baseline:  Goal status: IN PROGRESS  2.  Pt will report reduced memory mistakes via PROM by dc  Baseline: 53 Goal status: IN PROGRESS  3.  Pt will participate in structured 15 minute generative language task with use of anomia and discourse strategies/compensations PRN, with global discourse score of 2.8 or greater  Baseline: 2.1 Goal status: IN PROGRESS  ASSESSMENT:  CLINICAL IMPRESSION: Patient is a 28 y.o. F who was seen today for cognitive linguistic and aphasia evaluation upon referral from Dr. Vickey Huger (neurology). Evaluation reveals moderate challenges in verbal expression, c/b halting speech 2/2 word finding with usual mazing and decreased topic maintenance. Deficits result in decreased discourse cohesion. Communication partner required to maintain extended attention and following tangential speech to determine topic and important aspects of pt's intended message. Pt's endorses memory and attention challenges first noted in 2022 but worsening within the past year. Memory mistakes questionnaire facilitative in identifying functional needs which can be addressed with identification and implementation of appropriate cognitive aids/strategies. Pt reports challenges in schedule management, recalling information in conversations, and challenges performing vocational duties. Pt would benefit from skilled ST to address aforementioned deficits to improve QoL, enhance communication efficacy, and maximize independence.   OBJECTIVE IMPAIRMENTS: include attention, memory, executive functioning, and expressive language. These impairments are  limiting patient from managing appointments, managing finances, household responsibilities, effectively communicating at home and in community, and successful participation in vocational duties . Factors affecting potential to achieve goals and functional outcome are co-morbidities and previous level of function. Patient will benefit from skilled SLP services to address above impairments and improve overall function.  REHAB POTENTIAL: Good  PLAN:  SLP FREQUENCY: 2x/week  SLP DURATION: 7 weeks   PLANNED INTERVENTIONS: Cognitive reorganization, Internal/external aids, Oral motor exercises, Functional tasks, Multimodal communication approach, SLP instruction and feedback, and Compensatory strategies    [place], Student-SLP 10/23/2022, 2:03 PM

## 2022-10-24 ENCOUNTER — Ambulatory Visit: Payer: 59 | Admitting: Speech Pathology

## 2022-10-24 DIAGNOSIS — R41841 Cognitive communication deficit: Secondary | ICD-10-CM | POA: Diagnosis not present

## 2022-10-24 NOTE — Therapy (Signed)
OUTPATIENT SPEECH LANGUAGE PATHOLOGY TREATMENT   Patient Name: Jill Alexander MRN: [id number] DOB:[birth year], 27 y.o., female Today's Date: 10/24/2022  PCP: Lexine Baton REFERRING PROVIDER: Melvyn Novas, MD  END OF SESSION:  End of Session - 10/24/22 1319     Visit Number 3    Number of Visits 13    Date for SLP Re-Evaluation 12/05/22    Authorization Type CHEP    SLP Start Time 1320    SLP Stop Time  1405    SLP Time Calculation (min) 45 min    Activity Tolerance Patient tolerated treatment well               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   Past Surgical History:  Procedure Laterality Date   none     Patient Active Problem List   Diagnosis Date Noted   Excessive daytime sleepiness 03/18/2022   Non-restorative sleep 03/18/2022   Chronic fatigue and malaise 03/18/2022   Loud snoring 03/18/2022   Chiari malformation type I (HCC) 03/18/2022   GAD (generalized anxiety disorder) 02/06/2022   MDD (major depressive disorder), recurrent severe, 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   Women's annual routine gynecological examination 12/25/2021   Encounter for birth control pills maintenance 11/22/2021   History of Chiari malformation 09/26/2021   Urinary urgency 09/26/2021   Common migraine with intractable migraine 07/01/2019   Intermittent headache 10/26/2018   Epigastric pain 10/26/2018   Gastroesophageal reflux disease 10/26/2018   Bipolar 2 disorder (HCC) 06/02/2018   Panic attacks 06/02/2018   Autism spectrum disorder 05/06/2018   Anxiety and depression 05/06/2018   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   Seizures (HCC) 04/09/2017   Tremor, 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     ONSET DATE: 10/15/22   REFERRING DIAG: R47.01 (ICD-10-CM) - Aphasia  THERAPY DIAG:  Cognitive communication deficit  Rationale for Evaluation and Treatment: Rehabilitation  SUBJECTIVE:   SUBJECTIVE STATEMENT: "It was helpful to go over the memory strategies from last time."  Pt accompanied by: self  PERTINENT HISTORY: PMH remarkable for Anxiety, Autism, Chronic nausea, Common migraine with intractable migraine (07/01/2019), Depression, Dyspepsia, GERD (gastroesophageal reflux disease), Headache, Heart murmur, Seizures (HCC), and Tremor, essential (04/09/2017).   PAIN:  Are you having pain? No  FALLS: Has patient fallen in last 6 months?  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   PATIENT GOALS: "to remember what people say."  OBJECTIVE:   DIAGNOSTIC FINDINGS:07/24/22 MR BRAIN IMPRESSION: MRI scan of the brain with and without contrast showing no significant abnormalities in the brain.  Incidental finding of type I Arnold-Chiari malformation with crowding of the craniovertebral junction but no associated brainstem deformities or syringomyelia is noted.    COGNITION: Overall cognitive status: Impaired Areas of impairment:  Attention: Impaired: Focused, Alternating, Divided Memory: Impaired: Working Short term Long term Designer, fashion/clothing function: Impaired: Initiation and Slow processing Functional deficits: Patient presents with limited word finding, memory and endorses attention issues.   AUDITORY COMPREHENSION: Overall auditory comprehension: Appears intact YES/NO questions: Appears intact  Following directions: Appears intact Conversation: Simple, Complex, and patient requires direct language. Interfering components: attention, anxiety, and processing  speed Effective technique: extra processing time, repetition/stressing words, and visual/gestural cues  READING COMPREHENSION: Intact  EXPRESSION: verbal  VERBAL EXPRESSION: Level of generative/spontaneous verbalization: word, phrase, sentence, and conversation Interfering components: attention Effective technique: open ended questions, semantic cues, sentence completion, phonemic cues, and written cues Non-verbal means of communication: N/A Global Coherence Discourse score (Leaman & Ty Hilts, 2020): 2.1 Comments: Patient demonstrating frequent halting and use of filler words, which she endorses is 2/2 inability to generate needed word. Naming marked by prolonged pause, occasional self-corrected semantic paraphasia.   MOTOR SPEECH: Overall motor speech: Appears intact Level of impairment: Word, Phrase, Sentence, and Conversation Respiration: clavicular breathing, speaking on residual capacity, and patient presented with anxiety.  Phonation: normal Resonance: WFL Articulation: Appears intact Intelligibility: Intelligible  STANDARDIZED ASSESSMENTS: [place] NAMING TEST: 40/40 x4 semantic paraphasias with self-correction. Usual extended pause prior to naming.   PATIENT REPORTED OUTCOME MEASURES (PROM): MMQ Memory Mistakes: 76 "All the time:" misplace commonly used items, forget an appointment, forget what about to do, forget to run an errand, difficulty with word finding, forgetting what about to say in conversation "Often:" forgetting names, leaving things behind, forget to pass on message, forget to buy something needed "Sometimes:" recalling details read, forgetting medication, retelling story or joke  TODAY'S TREATMENT:                                                                                                                                         DATE:   10/24/22: Patient states that she took her medication last night and this morning, but did not check the box to keep, but  that she will try to implement that practice going forward. She states that going over memory strategies from previous session was helpful and that she enjoys repetition to help her retain/remember details in information. SLP initiated addressing challenges in performance of work duties. Pt had difficulty determining needs, explanation difficult to following d/t pt's verbosity and mazing. 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 SLP recommended journaling, and brain dumps for patient in order to delegate cognitive load to external aids.     10/23/22: SLP targeted remembering to take medication with external memory aids. Patient states that her mother puts her pills in the container, but that she often forgets to take them, and does not just her memory if she did take them. Patient endorses that she will forget when she does not pay attention and "feels like a robot" when completing tasks.SLP guided putting repetitive alarms on patients phone as well as a check in sheet to keep by the medication for additional accountability.  Education given on different external and internal memory aids, and questions answered to patient satisfaction. Patient's speech  continues to be tangential during the session, and she needs direct, explicit language. Patient states frustration with work, and wants to improve. She is encouraged to ask her boss for feedback.   10/17/22: Patient demonstrates with anxiety as demonstrated by shaking, and thoracic breathing. Patient states struggles with memory, attention, and word finding. Unstructured discourse patient presented with staggered, tangential speech, she benefited from simple, direct questions. Verbal cues required for topic maintenance required to facilitate evaluation. Education provided on side affects of Topamax as it notes side effects for challenges in attention, memory, and word finding.  Patient will reach out to her neurologist to see if a different medication will work best for her. Education provided on evaluation results and SLP's recommendations. Initiated training re: use of external cognitive aids. Pt verbalizes agreement with POC, all questions answered to satisfaction.   PATIENT EDUCATION: Education details: See above  Person educated: Patient Education method: Explanation Education comprehension: verbalized understanding  GOALS: Goals reviewed with patient? Yes  SHORT TERM GOALS: Target date: 11/14/2022  Patient will successfully use external aids to address x3 functional memory challenges with min-A Baseline: Goal status: IN PROGRESS  2.  Patient will demonstrate word finding compensation strategies with 80% accuracy during structured tasks with occasional min-A, Baseline:  Goal status: IN PROGRESS  3.  Pt will demonstrate processing and working memory strategies during role play, resulting in recall of spoken information with 80% accuracy  Baseline:  Goal status: IN PROGRESS   LONG TERM GOALS: Target date: 12/05/2022  Pt will verbalize x4 examples of carryover of memory strategies and compensations  Baseline:  Goal status: IN PROGRESS  2.  Pt will report reduced memory mistakes via PROM by dc  Baseline: 53 Goal status: IN PROGRESS  3.  Pt will participate in structured 15 minute generative language task with use of anomia and discourse strategies/compensations PRN, with global discourse score of 2.8 or greater  Baseline: 2.1 Goal status: IN PROGRESS  ASSESSMENT:  CLINICAL IMPRESSION: Patient is a 27 y.o. F who was seen today for cognitive linguistic and aphasia evaluation upon referral from Dr. Vickey Huger (neurology). Evaluation reveals moderate challenges in verbal expression, c/b halting speech 2/2 word finding with usual mazing and decreased topic maintenance. Deficits result in decreased discourse cohesion. Communication partner required to  maintain extended attention and following tangential speech to determine topic and important aspects of pt's intended message. Pt's endorses memory and attention challenges first noted in 2022 but worsening within the past year. Memory mistakes questionnaire facilitative in identifying functional needs which can be addressed with identification and implementation of appropriate cognitive aids/strategies. Pt reports challenges in schedule management, recalling information in conversations, and challenges performing vocational duties. Pt would benefit from skilled ST to address aforementioned deficits to improve QoL, enhance communication efficacy, and maximize independence.   OBJECTIVE IMPAIRMENTS: include attention, memory, executive functioning, and expressive language. These impairments are limiting patient from managing appointments, managing finances, household responsibilities, effectively communicating at home and in community, and successful participation in vocational duties . Factors affecting potential to achieve goals and functional outcome are co-morbidities and previous level of function. Patient will benefit from skilled SLP services to address above impairments and improve overall function.  REHAB POTENTIAL: Good  PLAN:  SLP FREQUENCY: 2x/week  SLP DURATION: 7 weeks   PLANNED INTERVENTIONS: Cognitive reorganization, Internal/external aids, Oral motor exercises, Functional tasks, Multimodal communication approach, SLP instruction and feedback, and Compensatory strategies    [place], Student-SLP 10/24/2022, 1:20 PM

## 2022-10-25 ENCOUNTER — Other Ambulatory Visit (HOSPITAL_COMMUNITY): Payer: Self-pay

## 2022-10-29 ENCOUNTER — Ambulatory Visit: Payer: 59 | Admitting: Clinical

## 2022-10-29 ENCOUNTER — Other Ambulatory Visit (HOSPITAL_COMMUNITY): Payer: Self-pay | Admitting: Psychiatry

## 2022-10-29 ENCOUNTER — Other Ambulatory Visit: Payer: Self-pay

## 2022-10-29 ENCOUNTER — Other Ambulatory Visit (HOSPITAL_COMMUNITY): Payer: Self-pay

## 2022-10-29 DIAGNOSIS — F332 Major depressive disorder, recurrent severe without psychotic features: Secondary | ICD-10-CM

## 2022-10-29 DIAGNOSIS — F411 Generalized anxiety disorder: Secondary | ICD-10-CM

## 2022-10-29 DIAGNOSIS — F410 Panic disorder [episodic paroxysmal anxiety] without agoraphobia: Secondary | ICD-10-CM

## 2022-10-29 MED ORDER — OMEPRAZOLE 20 MG PO CPDR
20.0000 mg | DELAYED_RELEASE_CAPSULE | Freq: Every day | ORAL | 0 refills | Status: DC
  Filled 2022-10-29 – 2022-12-02 (×3): qty 90, 90d supply, fill #0

## 2022-10-30 ENCOUNTER — Other Ambulatory Visit (HOSPITAL_COMMUNITY): Payer: Self-pay

## 2022-10-30 ENCOUNTER — Other Ambulatory Visit: Payer: Self-pay

## 2022-11-01 ENCOUNTER — Other Ambulatory Visit (HOSPITAL_COMMUNITY): Payer: Self-pay

## 2022-11-01 ENCOUNTER — Other Ambulatory Visit: Payer: Self-pay

## 2022-11-03 ENCOUNTER — Telehealth: Payer: 59 | Admitting: Family

## 2022-11-03 DIAGNOSIS — R399 Unspecified symptoms and signs involving the genitourinary system: Secondary | ICD-10-CM

## 2022-11-03 MED ORDER — CEPHALEXIN 500 MG PO CAPS
500.0000 mg | ORAL_CAPSULE | Freq: Two times a day (BID) | ORAL | 0 refills | Status: DC
  Filled 2022-11-03: qty 14, 7d supply, fill #0

## 2022-11-03 NOTE — Progress Notes (Signed)

## 2022-11-04 ENCOUNTER — Other Ambulatory Visit (HOSPITAL_COMMUNITY): Payer: Self-pay

## 2022-11-04 ENCOUNTER — Other Ambulatory Visit: Payer: Self-pay

## 2022-11-04 DIAGNOSIS — F900 Attention-deficit hyperactivity disorder, predominantly inattentive type: Secondary | ICD-10-CM | POA: Diagnosis not present

## 2022-11-04 DIAGNOSIS — F411 Generalized anxiety disorder: Secondary | ICD-10-CM | POA: Diagnosis not present

## 2022-11-04 DIAGNOSIS — F840 Autistic disorder: Secondary | ICD-10-CM | POA: Diagnosis not present

## 2022-11-04 DIAGNOSIS — F331 Major depressive disorder, recurrent, moderate: Secondary | ICD-10-CM | POA: Diagnosis not present

## 2022-11-04 MED ORDER — ATOMOXETINE HCL 40 MG PO CAPS
40.0000 mg | ORAL_CAPSULE | Freq: Every day | ORAL | 1 refills | Status: DC
  Filled 2022-11-04: qty 30, 30d supply, fill #0
  Filled 2023-01-28: qty 30, 30d supply, fill #1

## 2022-11-05 ENCOUNTER — Ambulatory Visit (INDEPENDENT_AMBULATORY_CARE_PROVIDER_SITE_OTHER): Payer: 59 | Admitting: Clinical

## 2022-11-05 DIAGNOSIS — F840 Autistic disorder: Secondary | ICD-10-CM

## 2022-11-05 NOTE — Progress Notes (Signed)
Time: 11:00 am-11:55 am CPT Code: 16109U-04 Diagnosis: F84.0  Jill Alexander was seen remotely using secure video conferencing. She was in her home and the therapist was in her office at the time of the appointment. Client is aware of the risks of telehealth and consented to a virtual visit. She had reached out to the therapist via secure message to request discussion of her unpredictable emotions in response to relationships, but reported that she had reflected upon this further and realized she was overthinking. She reported an improvement in her situation at work, including no further meltdowns or feedback, and having been scheduled for an additional shift. Session focused on stress related to a writing class she has been taking. Therapist processed several situations with her to help her better understand social cues. Client reported an increase in mood swings since starting a straterra prescription three weeks prior. Suicidal ideation and intent were denied, and she has reached out to her psychiatrist about this. She is scheduled to be seen again in two weeks.  Intake Presenting Problem Jill Alexander presented seeking CBT to help her manage difficulties with emotion regulation and reframing negative thought patterns that contribute to anxiety and worry. Jill Alexander has a diagnosis of Autism Spectrum Disorder (ASD). Symptoms feeling nervous or on edge, difficulty controlling worry, worrying about a variety of things, difficulty relaxing, irritability, fears for the future, feelings of hopelessness, difficulty sleeping, feeling tired, appetite irregularities, poor self-image, passive suicidal ideation without plan or intent (client contracted for safety during initial session) History of Problem  Jill Alexander reported that she moved from [place] with her family in July of 2018. Since that time, she has been seen by a provider in the area for challenges with emotion regulation and anxiety related to her diagnosis of autism  spectrum disorder. Jill Alexander lives with her parents. She has a 11 year-old sister who has struggled with similar issues, as well as a brother who is three years older than she is and lives in [place]. Jill Alexander described a long history of therapy and therapeutic programs in order to help her manage these issues, including a partial hospitalization program in December of 2015.  Recent Trigger  Jill Alexander reported that she was seeking a change in pace with regard to her therapeutic treatment in order to ensure continued progress.  Marital and Family Information  Present family concerns/problems: Jill Alexander shared several accounts of disputes with her mother, including her mother becoming upset with her for sharing worries with her immediately after her mother has woken up, despite her mother's requests that she not do so.  Strengths/resources in the family/friends: Jill Alexander described overall supportive relationships with her parents.  Marital/sexual history patterns: N/A  Family of Origin  Problems in family of origin: None reported.  Family background / ethnic factors: none reported.  No needs/concerns related to ethnicity reported when asked: No  Education/Vocation  Interpersonal concerns/problems: Jill Alexander reported that she has difficulties with social interactions related to her ASD diagnosis, and often perseverates about perceived missteps in both the immediate and distant past.  Personal strengths: Jill Alexander presented as motivated to improve her situation, and with some insight as to what is effective for her.  Military/work problems/concerns: None noted.  Leisure Activities/Daily Functioning  impaired functioning  Legal Status  No Legal Problems  Medical/Nutritional Concerns  unspecified  Comments: Jill Alexander reported that she often perseverates on perceived medical problems, and described herself as a hypochondriac  Religion/Spirituality  Not reported  Other  General Behavior: cooperative  Attire: appropriate  Gait:  normal  Motor Activity: normal  Stream of Thought - Productivity: spontaneous  Stream of thought - Progression: normal  Stream of thought - Language: normal  Emotional tone and reactions - Affect: appropriate  Mental trend/Content of thoughts - Perception: normal  Mental trend/Content of thoughts - Orientation: normal  Mental trend/Content of thoughts - Memory: normal  Mental trend/Content of thoughts - General knowledge: consistent with education  Insight: good  Judgment: good  Intelligence: average  Diagnostic Summary  Autism Spectrum Disorder (F84.0)   [facility] Counselor/Therapist Progress Note   Patient ID: Cyleigh Massaro, MRN: [id number],     Individualized Treatment Plan Strengths: Enjoys reading, Seeking counseling, In training program through [employer], "I like humor and jokes"  Supports: Her dog, her bestfriend since childhood, her parents, her sister    Goal/Needs for Treatment:  In order of importance to patient 1) Emotional Regulation/Anxiety 2) Improve Positive self talk      Client Statement of Needs: "Find ways of being nicer to myself", cope w/ anxiety and worry    Treatment Level:Outpatient Counseling  Symptoms:Anxiety, Perseverative worry, Negative self talk  Client Treatment Preferences:weekly counseling,  Prefers to be called Jill Alexander    Healthcare consumer's goal for treatment:  Dr. Charlyne Mom will support the patient's ability to achieve the goals identified. Cognitive Behavioral Therapy, Supportive Counseling, Mindfulness and Relaxation Strategies and other evidenced-based practices will be used to promote progress towards healthy functioning.    Healthcare consumer will: Actively participate in therapy, working towards healthy functioning.     *Justification for Continuation/Discontinuation of Goal: R=Revised, O=Ongoing, A=Achieved, D=Discontinued   Goal 1) Jill Alexander will develop strategies to regulate her emotions and improve her  positive self talk Likert rating baseline date 04/12/21: How Easy - 3; How Often - 50% Target Date Goal Was reviewed Status Code Progress towards goal/Likert rating  05/02/23   New Ongoing                      Jill Alexander participated in development of tx plan and provided verbal consent.     Chrissie Noa, PhD               Chrissie Noa, PhD               Chrissie Noa, PhD               Chrissie Noa, PhDTime: 11:00 am-11:55 am CPT Code: [id number] Diagnosis: F84.0  Jill Alexander was seen in person for therapy. She reported that she had overslept and had nearly not made it to her session on time, and as a result had not taken her medication that morning. Jill Alexander's processing appeared especially slow, and she especially struggled with word retrieval without session. She reported that she has started seeing a new provider for medication management, and had been placed on a low dose of stimulant medication. She expressed continued frustrations with work, and reflected upon an upcoming [place]. Therapist pointed out maladaptive cognitions and offered alternate perspectives. She is scheduled to be seen again in one week.  Intake Presenting Problem Jill Alexander presented seeking CBT to help her manage difficulties with emotion regulation and reframing negative thought patterns that contribute to anxiety and worry. Jill Alexander has a diagnosis of Autism Spectrum Disorder (ASD). Symptoms feeling nervous or on edge, difficulty controlling worry, worrying about a variety of things, difficulty relaxing, irritability, fears for the future, feelings of hopelessness, difficulty sleeping, feeling tired, appetite irregularities, poor self-image,  passive suicidal ideation without plan or intent (client contracted for safety during initial session) History of Problem  Jill Alexander reported that she moved from [place] with her family in July of 2018. Since that time, she has  been seen by a provider in the area for challenges with emotion regulation and anxiety related to her diagnosis of autism spectrum disorder. Jill Alexander lives with her parents. She has a 56 year-old sister who has struggled with similar issues, as well as a brother who is three years older than she is and lives in [place]. Jill Alexander described a long history of therapy and therapeutic programs in order to help her manage these issues, including a partial hospitalization program in December of 2015.  Recent Trigger  Jill Alexander reported that she was seeking a change in pace with regard to her therapeutic treatment in order to ensure continued progress.  Marital and Family Information  Present family concerns/problems: Jill Alexander shared several accounts of disputes with her mother, including her mother becoming upset with her for sharing worries with her immediately after her mother has woken up, despite her mother's requests that she not do so.  Strengths/resources in the family/friends: Jill Alexander described overall supportive relationships with her parents.  Marital/sexual history patterns: N/A  Family of Origin  Problems in family of origin: None reported.  Family background / ethnic factors: none reported.  No needs/concerns related to ethnicity reported when asked: No  Education/Vocation  Interpersonal concerns/problems: Jill Alexander reported that she has difficulties with social interactions related to her ASD diagnosis, and often perseverates about perceived missteps in both the immediate and distant past.  Personal strengths: Jill Alexander presented as motivated to improve her situation, and with some insight as to what is effective for her.  Military/work problems/concerns: None noted.  Leisure Activities/Daily Functioning  impaired functioning  Legal Status  No Legal Problems  Medical/Nutritional Concerns  unspecified  Comments: Jill Alexander reported that she often perseverates on perceived medical problems, and described herself as a  hypochondriac  Religion/Spirituality  Not reported  Other  General Behavior: cooperative  Attire: appropriate  Gait: normal  Motor Activity: normal  Stream of Thought - Productivity: spontaneous  Stream of thought - Progression: normal  Stream of thought - Language: normal  Emotional tone and reactions - Affect: appropriate  Mental trend/Content of thoughts - Perception: normal  Mental trend/Content of thoughts - Orientation: normal  Mental trend/Content of thoughts - Memory: normal  Mental trend/Content of thoughts - General knowledge: consistent with education  Insight: good  Judgment: good  Intelligence: average  Diagnostic Summary  Autism Spectrum Disorder (F84.0)   [facility] Counselor/Therapist Progress Note   Patient ID: Rickia Freeburg, MRN: [id number],     Individualized Treatment Plan Strengths: Enjoys reading, Seeking counseling, In training program through [employer], "I like humor and jokes"  Supports: Her dog, her bestfriend since childhood, her parents, her sister    Goal/Needs for Treatment:  In order of importance to patient 1) Emotional Regulation/Anxiety 2) Improve Positive self talk      Client Statement of Needs: "Find ways of being nicer to myself", cope w/ anxiety and worry    Treatment Level:Outpatient Counseling  Symptoms:Anxiety, Perseverative worry, Negative self talk  Client Treatment Preferences:weekly counseling,  Prefers to be called Jill Alexander    Healthcare consumer's goal for treatment:  Dr. Charlyne Mom will support the patient's ability to achieve the goals identified. Cognitive Behavioral Therapy, Supportive Counseling, Mindfulness and Relaxation Strategies and other evidenced-based practices will be used to promote progress towards  healthy functioning.    Healthcare consumer will: Actively participate in therapy, working towards healthy functioning.     *Justification for Continuation/Discontinuation of Goal:  R=Revised, O=Ongoing, A=Achieved, D=Discontinued   Goal 1) Jill Alexander will develop strategies to regulate her emotions and improve her positive self talk Likert rating baseline date 04/12/21: How Easy - 3; How Often - 50% Target Date Goal Was reviewed Status Code Progress towards goal/Likert rating  05/02/23   New Ongoing                      Jill Alexander participated in development of tx plan and provided verbal consent.     Chrissie Noa, PhD               Chrissie Noa, PhD               Chrissie Noa, PhD               Chrissie Noa, PhD               Chrissie Noa, PhD               Chrissie Noa, PhD

## 2022-11-06 ENCOUNTER — Ambulatory Visit: Payer: 59 | Attending: Neurology | Admitting: Speech Pathology

## 2022-11-06 DIAGNOSIS — R4701 Aphasia: Secondary | ICD-10-CM | POA: Insufficient documentation

## 2022-11-06 DIAGNOSIS — R4789 Other speech disturbances: Secondary | ICD-10-CM | POA: Insufficient documentation

## 2022-11-06 DIAGNOSIS — R41841 Cognitive communication deficit: Secondary | ICD-10-CM | POA: Insufficient documentation

## 2022-11-06 NOTE — Telephone Encounter (Signed)
Pt does not yet have an appointment with new provider.

## 2022-11-06 NOTE — Therapy (Signed)
OUTPATIENT SPEECH LANGUAGE PATHOLOGY TREATMENT   Patient Name: Jill Alexander MRN: [id number] DOB:[birth year], 27 y.o., female Today's Date: 11/06/2022  PCP: Lexine Baton REFERRING PROVIDER: Melvyn Novas, MD  END OF SESSION:  End of Session - 11/06/22 1102     Visit Number 4    Number of Visits 13    Date for SLP Re-Evaluation 12/05/22    Authorization Type CHEP    SLP Start Time 1102    SLP Stop Time  1147    SLP Time Calculation (min) 45 min    Activity Tolerance Patient tolerated treatment well             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   Past Surgical History:  Procedure Laterality Date   none     Patient Active Problem List   Diagnosis Date Noted   Excessive daytime sleepiness 03/18/2022   Non-restorative sleep 03/18/2022   Chronic fatigue and malaise 03/18/2022   Loud snoring 03/18/2022   Chiari malformation type I (HCC) 03/18/2022   GAD (generalized anxiety disorder) 02/06/2022   MDD (major depressive disorder), recurrent severe, 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   Women's annual routine gynecological examination 12/25/2021   Encounter for birth control pills maintenance 11/22/2021   History of Chiari malformation 09/26/2021   Urinary urgency 09/26/2021   Common migraine with intractable migraine 07/01/2019   Intermittent headache 10/26/2018   Epigastric pain 10/26/2018   Gastroesophageal reflux disease 10/26/2018   Bipolar 2 disorder (HCC) 06/02/2018   Panic attacks 06/02/2018   Autism spectrum disorder 05/06/2018   Anxiety and depression 05/06/2018   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   Seizures (HCC) 04/09/2017   Tremor, 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    ONSET  DATE: 10/15/22   REFERRING DIAG: R47.01 (ICD-10-CM) - Aphasia  THERAPY DIAG:  Cognitive communication deficit  Rationale for Evaluation and Treatment: Rehabilitation  SUBJECTIVE:   SUBJECTIVE STATEMENT: "I have clinical depression and I am having a rough time."  Pt accompanied by: self  PERTINENT HISTORY: PMH remarkable for Anxiety, Autism, Chronic nausea, Common migraine with intractable migraine (07/01/2019), Depression, Dyspepsia, GERD (gastroesophageal reflux disease), Headache, Heart murmur, Seizures (HCC), and Tremor, essential (04/09/2017).   PAIN:  Are you having pain? No  FALLS: Has patient fallen in last 6 months?  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   PATIENT GOALS: "to remember what people say."  OBJECTIVE:   DIAGNOSTIC FINDINGS:07/24/22 MR BRAIN IMPRESSION: MRI scan of the brain with and without contrast showing no significant abnormalities in the brain.  Incidental finding of type I Arnold-Chiari malformation with crowding of the craniovertebral junction but no associated brainstem deformities or syringomyelia is noted.    COGNITION: Overall cognitive status: Impaired Areas of impairment:  Attention: Impaired: Focused, Alternating, Divided Memory: Impaired: Working Short term Long term Designer, fashion/clothing function: Impaired: Initiation and Slow processing Functional deficits: Patient presents with limited word finding, memory and endorses attention issues.   AUDITORY COMPREHENSION: Overall auditory comprehension: Appears intact YES/NO questions: Appears intact Following directions: Appears  intact Conversation: Simple, Complex, and patient requires direct language. Interfering components: attention, anxiety, and processing speed Effective technique:  extra processing time, repetition/stressing words, and visual/gestural cues  READING COMPREHENSION: Intact  EXPRESSION: verbal  VERBAL EXPRESSION: Level of generative/spontaneous verbalization: word, phrase, sentence, and conversation Interfering components: attention Effective technique: open ended questions, semantic cues, sentence completion, phonemic cues, and written cues Non-verbal means of communication: N/A Global Coherence Discourse score (Leaman & Ty Hilts, 2020): 2.1 Comments: Patient demonstrating frequent halting and use of filler words, which she endorses is 2/2 inability to generate needed word. Naming marked by prolonged pause, occasional self-corrected semantic paraphasia.   MOTOR SPEECH: Overall motor speech: Appears intact Level of impairment: Word, Phrase, Sentence, and Conversation Respiration: clavicular breathing, speaking on residual capacity, and patient presented with anxiety.  Phonation: normal Resonance: WFL Articulation: Appears intact Intelligibility: Intelligible  STANDARDIZED ASSESSMENTS: [place] NAMING TEST: 40/40 x4 semantic paraphasias with self-correction. Usual extended pause prior to naming.   PATIENT REPORTED OUTCOME MEASURES (PROM): MMQ Memory Mistakes: 62 "All the time:" misplace commonly used items, forget an appointment, forget what about to do, forget to run an errand, difficulty with word finding, forgetting what about to say in conversation "Often:" forgetting names, leaving things behind, forget to pass on message, forget to buy something needed "Sometimes:" recalling details read, forgetting medication, retelling story or joke  TODAY'S TREATMENT:                                                                                                                                         DATE:   11/06/22: Patient came in demonstrating low mood by hanging head low and not answering direct question. Stated that she has clinical depression and is  struggling currently with anxiety and depression and that she has tried journaling and that it helped a little. SLP educated through "Therapeutic Journaling" to journal with more intention. Recommended pt discuss protocol with mental health professional to assess if it would be a good fit. Education on tracking mood throughout the day to collect data on mood to better manage it. Patient states that listening to podcasts, musical, and reading help pull her out of her depression. Patient advocated for herself to change her appointment from Friday to Thursday due to work conflict.   10/24/22: Patient states that she took her medication last night and this morning, but did not check the box to keep, but that she will try to implement that practice going forward. She states that going over memory strategies from previous session was helpful and that she enjoys repetition to help her retain/remember details in information. SLP initiated addressing challenges in performance of work duties. Pt had difficulty determining needs, explanation difficult to following d/t pt's verbosity and mazing. 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 SLP recommended journaling, and brain dumps  for patient in order to delegate cognitive load to external aids.     10/23/22: SLP targeted remembering to take medication with external memory aids. Patient states that her mother puts her pills in the container, but that she often forgets to take them, and does not just her memory if she did take them. Patient endorses that she will forget when she does not pay attention and "feels like a robot" when completing tasks.SLP guided putting repetitive alarms on patients phone as well as a check in sheet to keep by the medication for additional accountability.  Education given on different external and internal memory aids, and questions answered to patient  satisfaction. Patient's speech continues to be tangential during the session, and she needs direct, explicit language. Patient states frustration with work, and wants to improve. She is encouraged to ask her boss for feedback.   10/17/22: Patient demonstrates with anxiety as demonstrated by shaking, and thoracic breathing. Patient states struggles with memory, attention, and word finding. Unstructured discourse patient presented with staggered, tangential speech, she benefited from simple, direct questions. Verbal cues required for topic maintenance required to facilitate evaluation. Education provided on side affects of Topamax as it notes side effects for challenges in attention, memory, and word finding. Patient will reach out to her neurologist to see if a different medication will work best for her. Education provided on evaluation results and SLP's recommendations. Initiated training re: use of external cognitive aids. Pt verbalizes agreement with POC, all questions answered to satisfaction.   PATIENT EDUCATION: Education details: See above  Person educated: Patient Education method: Explanation Education comprehension: verbalized understanding  GOALS: Goals reviewed with patient? Yes  SHORT TERM GOALS: Target date: 11/14/2022  Patient will successfully use external aids to address x3 functional memory challenges with min-A Baseline: Goal status: IN PROGRESS  2.  Patient will demonstrate word finding compensation strategies with 80% accuracy during structured tasks with occasional min-A, Baseline:  Goal status: IN PROGRESS  3.  Pt will demonstrate processing and working memory strategies during role play, resulting in recall of spoken information with 80% accuracy  Baseline:  Goal status: IN PROGRESS   LONG TERM GOALS: Target date: 12/05/2022  Pt will verbalize x4 examples of carryover of memory strategies and compensations  Baseline:  Goal status: IN PROGRESS  2.  Pt will report  reduced memory mistakes via PROM by dc  Baseline: 53 Goal status: IN PROGRESS  3.  Pt will participate in structured 15 minute generative language task with use of anomia and discourse strategies/compensations PRN, with global discourse score of 2.8 or greater  Baseline: 2.1 Goal status: IN PROGRESS  ASSESSMENT:  CLINICAL IMPRESSION: Patient is a 27 y.o. F who was seen today for cognitive linguistic and aphasia evaluation upon referral from Dr. Vickey Huger (neurology). Evaluation reveals moderate challenges in verbal expression, c/b halting speech 2/2 word finding with usual mazing and decreased topic maintenance. Deficits result in decreased discourse cohesion. Communication partner required to maintain extended attention and following tangential speech to determine topic and important aspects of pt's intended message. Pt's endorses memory and attention challenges first noted in 2022 but worsening within the past year. Memory mistakes questionnaire facilitative in identifying functional needs which can be addressed with identification and implementation of appropriate cognitive aids/strategies. Pt reports challenges in schedule management, recalling information in conversations, and challenges performing vocational duties. Pt would benefit from skilled ST to address aforementioned deficits to improve QoL, enhance communication efficacy, and maximize independence.   OBJECTIVE IMPAIRMENTS:  include attention, memory, executive functioning, and expressive language. These impairments are limiting patient from managing appointments, managing finances, household responsibilities, effectively communicating at home and in community, and successful participation in vocational duties . Factors affecting potential to achieve goals and functional outcome are co-morbidities and previous level of function. Patient will benefit from skilled SLP services to address above impairments and improve overall function.  REHAB  POTENTIAL: Good  PLAN:  SLP FREQUENCY: 2x/week  SLP DURATION: 7 weeks   PLANNED INTERVENTIONS: Cognitive reorganization, Internal/external aids, Oral motor exercises, Functional tasks, Multimodal communication approach, SLP instruction and feedback, and Compensatory strategies    [place], Student-SLP 11/06/2022, 11:32 AM

## 2022-11-07 ENCOUNTER — Ambulatory Visit: Payer: 59 | Admitting: Speech Pathology

## 2022-11-07 DIAGNOSIS — R41841 Cognitive communication deficit: Secondary | ICD-10-CM

## 2022-11-07 DIAGNOSIS — R4789 Other speech disturbances: Secondary | ICD-10-CM | POA: Diagnosis not present

## 2022-11-07 DIAGNOSIS — R4701 Aphasia: Secondary | ICD-10-CM | POA: Diagnosis not present

## 2022-11-07 NOTE — Therapy (Signed)
OUTPATIENT SPEECH LANGUAGE PATHOLOGY TREATMENT   Patient Name: Jill Alexander MRN: [id number] DOB:08-13-1995, 27 y.o., female Today's Date: 11/07/2022  PCP: Lexine Baton REFERRING PROVIDER: Melvyn Novas, MD  END OF SESSION:  End of Session - 11/07/22 1014     Visit Number 5    Number of Visits 13    Date for SLP Re-Evaluation 12/05/22    Authorization Type CHEP    SLP Start Time 1014    SLP Stop Time  1059    SLP Time Calculation (min) 45 min    Activity Tolerance Patient tolerated treatment well             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   Past Surgical History:  Procedure Laterality Date   none     Patient Active Problem List   Diagnosis Date Noted   Excessive daytime sleepiness 03/18/2022   Non-restorative sleep 03/18/2022   Chronic fatigue and malaise 03/18/2022   Loud snoring 03/18/2022   Chiari malformation type I (HCC) 03/18/2022   GAD (generalized anxiety disorder) 02/06/2022   MDD (major depressive disorder), recurrent severe, 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   Women's annual routine gynecological examination 12/25/2021   Encounter for birth control pills maintenance 11/22/2021   History of Chiari malformation 09/26/2021   Urinary urgency 09/26/2021   Common migraine with intractable migraine 07/01/2019   Intermittent headache 10/26/2018   Epigastric pain 10/26/2018   Gastroesophageal reflux disease 10/26/2018   Bipolar 2 disorder (HCC) 06/02/2018   Panic attacks 06/02/2018   Autism spectrum disorder 05/06/2018   Anxiety and depression 05/06/2018   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   Seizures (HCC) 04/09/2017   Tremor, 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    ONSET  DATE: 10/15/22   REFERRING DIAG: R47.01 (ICD-10-CM) - Aphasia  THERAPY DIAG:  Cognitive communication deficit  Rationale for Evaluation and Treatment: Rehabilitation  SUBJECTIVE:   SUBJECTIVE STATEMENT: "Same as yesterday." Pt accompanied by: self  PERTINENT HISTORY: PMH remarkable for Anxiety, Autism, Chronic nausea, Common migraine with intractable migraine (07/01/2019), Depression, Dyspepsia, GERD (gastroesophageal reflux disease), Headache, Heart murmur, Seizures (HCC), and Tremor, essential (04/09/2017).   PAIN:  Are you having pain? No  FALLS: Has patient fallen in last 6 months?  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   PATIENT GOALS: "to remember what people say."  OBJECTIVE:   DIAGNOSTIC FINDINGS:07/24/22 MR BRAIN IMPRESSION: MRI scan of the brain with and without contrast showing no significant abnormalities in the brain.  Incidental finding of type I Arnold-Chiari malformation with crowding of the craniovertebral junction but no associated brainstem deformities or syringomyelia is noted.    COGNITION: Overall cognitive status: Impaired Areas of impairment:  Attention: Impaired: Focused, Alternating, Divided Memory: Impaired: Working Short term Long term Designer, fashion/clothing function: Impaired: Initiation and Slow processing Functional deficits: Patient presents with limited word finding, memory and endorses attention issues.   AUDITORY COMPREHENSION: Overall auditory comprehension: Appears intact YES/NO questions: Appears intact Following directions: Appears intact Conversation: Simple, Complex, and patient requires direct language.  Interfering components: attention, anxiety, and processing speed Effective technique: extra processing time, repetition/stressing  words, and visual/gestural cues  READING COMPREHENSION: Intact  EXPRESSION: verbal  VERBAL EXPRESSION: Level of generative/spontaneous verbalization: word, phrase, sentence, and conversation Interfering components: attention Effective technique: open ended questions, semantic cues, sentence completion, phonemic cues, and written cues Non-verbal means of communication: N/A Global Coherence Discourse score (Leaman & Ty Hilts, 2020): 2.1 Comments: Patient demonstrating frequent halting and use of filler words, which she endorses is 2/2 inability to generate needed word. Naming marked by prolonged pause, occasional self-corrected semantic paraphasia.   MOTOR SPEECH: Overall motor speech: Appears intact Level of impairment: Word, Phrase, Sentence, and Conversation Respiration: clavicular breathing, speaking on residual capacity, and patient presented with anxiety.  Phonation: normal Resonance: WFL Articulation: Appears intact Intelligibility: Intelligible  STANDARDIZED ASSESSMENTS: [place] NAMING TEST: 40/40 x4 semantic paraphasias with self-correction. Usual extended pause prior to naming.   PATIENT REPORTED OUTCOME MEASURES (PROM): MMQ Memory Mistakes: 84 "All the time:" misplace commonly used items, forget an appointment, forget what about to do, forget to run an errand, difficulty with word finding, forgetting what about to say in conversation "Often:" forgetting names, leaving things behind, forget to pass on message, forget to buy something needed "Sometimes:" recalling details read, forgetting medication, retelling story or joke  TODAY'S TREATMENT:                                                                                                                                         DATE:   11/07/22: Patient states frustration with loud household with dog, education on noise canceling headphones to decrease sound. SLP led through memory aids and provided education on using planners  on paper vs electronic. Patient states that she was unable to complete therapeutic journaling at home, briefly led pt through therapeutic writing task to target language and familiarization with too.Marland Kitchen Re-education provided with usual reference to prior written education provided to enable pt's practice, as pt continuing to report low mood impacting participation and initiation. Education provided on how managing mental health can have positive impact on cognitive functioning. All questions answered to patient satisfaction. ST will target utilization of memory aids.   11/06/22: Patient came in demonstrating low mood by hanging head low and not answering direct question. Stated that she has clinical depression and is struggling currently with anxiety and depression and that she has tried journaling and that it helped a little. SLP educated through "Therapeutic Journaling" to journal with more intention. Recommended pt discuss protocol with mental health professional to assess if it would be a good fit. Education on tracking mood throughout the day to collect data on mood to better manage it. Patient states that listening to podcasts, musical, and reading help pull her out of her depression. Patient advocated for herself to change her appointment from Friday to Thursday due to work conflict.   10/24/22: Patient states that  she took her medication last night and this morning, but did not check the box to keep, but that she will try to implement that practice going forward. She states that going over memory strategies from previous session was helpful and that she enjoys repetition to help her retain/remember details in information. SLP initiated addressing challenges in performance of work duties. Pt had difficulty determining needs, explanation difficult to following d/t pt's verbosity and mazing. 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 SLP recommended journaling, and brain dumps for patient in order to delegate cognitive load to external aids.     10/23/22: SLP targeted remembering to take medication with external memory aids. Patient states that her mother puts her pills in the container, but that she often forgets to take them, and does not just her memory if she did take them. Patient endorses that she will forget when she does not pay attention and "feels like a robot" when completing tasks.SLP guided putting repetitive alarms on patients phone as well as a check in sheet to keep by the medication for additional accountability.  Education given on different external and internal memory aids, and questions answered to patient satisfaction. Patient's speech continues to be tangential during the session, and she needs direct, explicit language. Patient states frustration with work, and wants to improve. She is encouraged to ask her boss for feedback.   10/17/22: Patient demonstrates with anxiety as demonstrated by shaking, and thoracic breathing. Patient states struggles with memory, attention, and word finding. Unstructured discourse patient presented with staggered, tangential speech, she benefited from simple, direct questions. Verbal cues required for topic maintenance required to facilitate evaluation. Education provided on side affects of Topamax as it notes side effects for challenges in attention, memory, and word finding. Patient will reach out to her neurologist to see if a different medication will work best for her. Education provided on evaluation results and SLP's recommendations. Initiated training re: use of external cognitive aids. Pt verbalizes agreement with POC, all questions answered to satisfaction.   PATIENT EDUCATION: Education details: See above  Person educated: Patient Education method: Explanation Education comprehension: verbalized understanding  GOALS: Goals reviewed with patient?  Yes  SHORT TERM GOALS: Target date: 11/14/2022  Patient will successfully use external aids to address x3 functional memory challenges with min-A Baseline: Goal status: IN PROGRESS  2.  Patient will demonstrate word finding compensation strategies with 80% accuracy during structured tasks with occasional min-A, Baseline:  Goal status: IN PROGRESS  3.  Pt will demonstrate processing and working memory strategies during role play, resulting in recall of spoken information with 80% accuracy  Baseline:  Goal status: IN PROGRESS  LONG TERM GOALS: Target date: 12/05/2022  Pt will verbalize x4 examples of carryover of memory strategies and compensations  Baseline:  Goal status: IN PROGRESS  2.  Pt will report reduced memory mistakes via PROM by dc  Baseline: 53 Goal status: IN PROGRESS  3.  Pt will participate in structured 15 minute generative language task with use of anomia and discourse strategies/compensations PRN, with global discourse score of 2.8 or greater  Baseline: 2.1 Goal status: IN PROGRESS  ASSESSMENT:  CLINICAL IMPRESSION: Patient is a 27 y.o. F who was seen today for cognitive linguistic and aphasia evaluation upon referral from Dr. Vickey Huger (neurology). Evaluation reveals moderate challenges in verbal expression, c/b halting speech 2/2 word finding with usual mazing and decreased topic  maintenance. Deficits result in decreased discourse cohesion. Communication partner required to maintain extended attention and following tangential speech to determine topic and important aspects of pt's intended message. Pt's endorses memory and attention challenges first noted in 2022 but worsening within the past year. Memory mistakes questionnaire facilitative in identifying functional needs which can be addressed with identification and implementation of appropriate cognitive aids/strategies. Pt reports challenges in schedule management, recalling information in conversations, and  challenges performing vocational duties. Pt would benefit from skilled ST to address aforementioned deficits to improve QoL, enhance communication efficacy, and maximize independence.   OBJECTIVE IMPAIRMENTS: include attention, memory, executive functioning, and expressive language. These impairments are limiting patient from managing appointments, managing finances, household responsibilities, effectively communicating at home and in community, and successful participation in vocational duties . Factors affecting potential to achieve goals and functional outcome are co-morbidities and previous level of function. Patient will benefit from skilled SLP services to address above impairments and improve overall function.  REHAB POTENTIAL: Good  PLAN:  SLP FREQUENCY: 2x/week  SLP DURATION: 7 weeks   PLANNED INTERVENTIONS: Cognitive reorganization, Internal/external aids, Oral motor exercises, Functional tasks, Multimodal communication approach, SLP instruction and feedback, and Compensatory strategies    [place], Student-SLP 11/07/2022, 10:14 AM

## 2022-11-08 ENCOUNTER — Ambulatory Visit: Payer: 59 | Admitting: Speech Pathology

## 2022-11-13 ENCOUNTER — Other Ambulatory Visit: Payer: Self-pay

## 2022-11-13 ENCOUNTER — Other Ambulatory Visit (HOSPITAL_COMMUNITY): Payer: Self-pay

## 2022-11-14 ENCOUNTER — Ambulatory Visit: Payer: 59 | Admitting: Speech Pathology

## 2022-11-14 DIAGNOSIS — R41841 Cognitive communication deficit: Secondary | ICD-10-CM | POA: Diagnosis not present

## 2022-11-14 DIAGNOSIS — R4789 Other speech disturbances: Secondary | ICD-10-CM | POA: Diagnosis not present

## 2022-11-14 DIAGNOSIS — M79645 Pain in left finger(s): Secondary | ICD-10-CM | POA: Diagnosis not present

## 2022-11-14 DIAGNOSIS — R4701 Aphasia: Secondary | ICD-10-CM | POA: Diagnosis not present

## 2022-11-14 NOTE — Therapy (Signed)
OUTPATIENT SPEECH LANGUAGE PATHOLOGY TREATMENT   Patient Name: Jill Alexander MRN: [id number] DOB:05/17/1995, 27 y.o., female Today's Date: 11/14/2022  PCP: Lexine Baton REFERRING PROVIDER: Melvyn Novas, MD  END OF SESSION:  End of Session - 11/14/22 0931     Visit Number 6    Number of Visits 13    Date for SLP Re-Evaluation 12/05/22    Authorization Type CHEP    SLP Start Time 0932    SLP Stop Time  1017    SLP Time Calculation (min) 45 min    Activity Tolerance Patient tolerated treatment well             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   Past Surgical History:  Procedure Laterality Date   none     Patient Active Problem List   Diagnosis Date Noted   Excessive daytime sleepiness 03/18/2022   Non-restorative sleep 03/18/2022   Chronic fatigue and malaise 03/18/2022   Loud snoring 03/18/2022   Chiari malformation type I (HCC) 03/18/2022   GAD (generalized anxiety disorder) 02/06/2022   MDD (major depressive disorder), recurrent severe, 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   Women's annual routine gynecological examination 12/25/2021   Encounter for birth control pills maintenance 11/22/2021   History of Chiari malformation 09/26/2021   Urinary urgency 09/26/2021   Common migraine with intractable migraine 07/01/2019   Intermittent headache 10/26/2018   Epigastric pain 10/26/2018   Gastroesophageal reflux disease 10/26/2018   Bipolar 2 disorder (HCC) 06/02/2018   Panic attacks 06/02/2018   Autism spectrum disorder 05/06/2018   Anxiety and depression 05/06/2018   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   Seizures (HCC) 04/09/2017   Tremor, 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    ONSET  DATE: 10/15/22   REFERRING DIAG: R47.01 (ICD-10-CM) - Aphasia  THERAPY DIAG:  Cognitive communication deficit  Word finding difficulty  Rationale for Evaluation and Treatment: Rehabilitation  SUBJECTIVE:   SUBJECTIVE STATEMENT: "Did I tell you I am going to get another job."  Pt accompanied by: self  PERTINENT HISTORY: PMH remarkable for Anxiety, Autism, Chronic nausea, Common migraine with intractable migraine (07/01/2019), Depression, Dyspepsia, GERD (gastroesophageal reflux disease), Headache, Heart murmur, Seizures (HCC), and Tremor, essential (04/09/2017).   PAIN:  Are you having pain? No  FALLS: Has patient fallen in last 6 months?  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   PATIENT GOALS: "to remember what people say."  OBJECTIVE:   DIAGNOSTIC FINDINGS:07/24/22 MR BRAIN IMPRESSION: MRI scan of the brain with and without contrast showing no significant abnormalities in the brain.  Incidental finding of type I Arnold-Chiari malformation with crowding of the craniovertebral junction but no associated brainstem deformities or syringomyelia is noted.    COGNITION: Overall cognitive status: Impaired Areas of impairment:  Attention: Impaired: Focused, Alternating, Divided Memory: Impaired: Working Short term Long term Designer, fashion/clothing function: Impaired: Initiation and Slow processing Functional deficits: Patient presents with limited word finding, memory and endorses attention issues.   AUDITORY COMPREHENSION: Overall auditory comprehension: Appears intact YES/NO questions: Appears  intact Following directions: Appears intact Conversation: Simple, Complex, and patient requires direct language. Interfering components: attention, anxiety, and processing speed Effective  technique: extra processing time, repetition/stressing words, and visual/gestural cues  READING COMPREHENSION: Intact  EXPRESSION: verbal  VERBAL EXPRESSION: Level of generative/spontaneous verbalization: word, phrase, sentence, and conversation Interfering components: attention Effective technique: open ended questions, semantic cues, sentence completion, phonemic cues, and written cues Non-verbal means of communication: N/A Global Coherence Discourse score (Leaman & Ty Hilts, 2020): 2.1 Comments: Patient demonstrating frequent halting and use of filler words, which she endorses is 2/2 inability to generate needed word. Naming marked by prolonged pause, occasional self-corrected semantic paraphasia.   MOTOR SPEECH: Overall motor speech: Appears intact Level of impairment: Word, Phrase, Sentence, and Conversation Respiration: clavicular breathing, speaking on residual capacity, and patient presented with anxiety.  Phonation: normal Resonance: WFL Articulation: Appears intact Intelligibility: Intelligible  STANDARDIZED ASSESSMENTS: [place] NAMING TEST: 40/40 x4 semantic paraphasias with self-correction. Usual extended pause prior to naming.   PATIENT REPORTED OUTCOME MEASURES (PROM): MMQ Memory Mistakes: 15 "All the time:" misplace commonly used items, forget an appointment, forget what about to do, forget to run an errand, difficulty with word finding, forgetting what about to say in conversation "Often:" forgetting names, leaving things behind, forget to pass on message, forget to buy something needed "Sometimes:" recalling details read, forgetting medication, retelling story or joke  TODAY'S TREATMENT:                                                                                                                                         DATE:   11/14/22: Patient presented Patient stated that she was confused with therapeutic journaling, and SLP clarified to read and re-write same  event 4 times. Patient led through therapeutic journaling today, and encouraged to discuss practice with health care worker. Re-education provided that SLP goal was to provide a tool which can be used if found of benefit, and recommendation to review strategy with mental health practitioner. Addressed d/t impact mental health can have on attention, memory, ex function, and task initiation/participation. Discussed memory aids, patient states that she currently uses [employer] calendar for appointments, My Chart. Education provided on attention in regard with memory, and to repeat things back to them, or write it down. Patient led through role play of ordering coffee with frequent min-A of guiding questions and prompting to repeat back information. Patient's memory increased when writing down information and reading it back. Targeted word finding through structured task, of finding similarities and differences between items, patient required frequent mod-A to remain on topic and semantic and phonic cues for word finding.   11/07/22: Patient states frustration with loud household with dog, education on noise canceling headphones to decrease sound. SLP led through memory aids and provided education on using planners on paper vs electronic. Patient states that she was unable to complete therapeutic journaling at home, briefly led  pt through therapeutic writing task to target language and familiarization with too.Marland Kitchen Re-education provided with usual reference to prior written education provided to enable pt's practice, as pt continuing to report low mood impacting participation and initiation. Education provided on how managing mental health can have positive impact on cognitive functioning. All questions answered to patient satisfaction. ST will target utilization of memory aids.   11/06/22: Patient came in demonstrating low mood by hanging head low and not answering direct question. Stated that she has clinical depression  and is struggling currently with anxiety and depression and that she has tried journaling and that it helped a little. SLP educated through "Therapeutic Journaling" to journal with more intention. Recommended pt discuss protocol with mental health professional to assess if it would be a good fit. Education on tracking mood throughout the day to collect data on mood to better manage it. Patient states that listening to podcasts, musical, and reading help pull her out of her depression. Patient advocated for herself to change her appointment from Friday to Thursday due to work conflict.   10/24/22: Patient states that she took her medication last night and this morning, but did not check the box to keep, but that she will try to implement that practice going forward. She states that going over memory strategies from previous session was helpful and that she enjoys repetition to help her retain/remember details in information. SLP initiated addressing challenges in performance of work duties. Pt had difficulty determining needs, explanation difficult to following d/t pt's verbosity and mazing. 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 SLP recommended journaling, and brain dumps for patient in order to delegate cognitive load to external aids.     10/23/22: SLP targeted remembering to take medication with external memory aids. Patient states that her mother puts her pills in the container, but that she often forgets to take them, and does not just her memory if she did take them. Patient endorses that she will forget when she does not pay attention and "feels like a robot" when completing tasks.SLP guided putting repetitive alarms on patients phone as well as a check in sheet to keep by the medication for additional accountability.  Education given on different external and internal memory aids, and questions answered to patient  satisfaction. Patient's speech continues to be tangential during the session, and she needs direct, explicit language. Patient states frustration with work, and wants to improve. She is encouraged to ask her boss for feedback.   10/17/22: Patient demonstrates with anxiety as demonstrated by shaking, and thoracic breathing. Patient states struggles with memory, attention, and word finding. Unstructured discourse patient presented with staggered, tangential speech, she benefited from simple, direct questions. Verbal cues required for topic maintenance required to facilitate evaluation. Education provided on side affects of Topamax as it notes side effects for challenges in attention, memory, and word finding. Patient will reach out to her neurologist to see if a different medication will work best for her. Education provided on evaluation results and SLP's recommendations. Initiated training re: use of external cognitive aids. Pt verbalizes agreement with POC, all questions answered to satisfaction.   PATIENT EDUCATION: Education details: See above  Person educated: Patient Education method: Explanation Education comprehension: verbalized understanding  GOALS: Goals reviewed with patient? Yes  SHORT TERM GOALS: Target date: 11/14/2022  Patient will successfully use external aids to address x3 functional memory challenges with min-A  Baseline: Goal status: MET   2.  Patient will demonstrate word finding compensation strategies with 80% accuracy during structured tasks with occasional min-A, Baseline:  Goal status: MET  3.  Pt will demonstrate processing and working memory strategies during role play, resulting in recall of spoken information with 80% accuracy  Baseline:  Goal status:  Partially Met   LONG TERM GOALS: Target date: 12/05/2022  Pt will verbalize x4 examples of carryover of memory strategies and compensations  Baseline:  Goal status: IN PROGRESS  2.  Pt will report reduced  memory mistakes via PROM by dc  Baseline: 53 Goal status: IN PROGRESS  3.  Pt will participate in structured 15 minute generative language task with use of anomia and discourse strategies/compensations PRN, with global discourse score of 2.8 or greater  Baseline: 2.1 Goal status: IN PROGRESS  ASSESSMENT:  CLINICAL IMPRESSION: Patient is a 27 y.o. F who was seen today for cognitive linguistic and aphasia evaluation upon referral from Dr. Vickey Huger (neurology). Evaluation reveals moderate challenges in verbal expression, c/b halting speech 2/2 word finding with usual mazing and decreased topic maintenance. Deficits result in decreased discourse cohesion. Communication partner required to maintain extended attention and following tangential speech to determine topic and important aspects of pt's intended message. Pt's endorses memory and attention challenges first noted in 2022 but worsening within the past year. Memory mistakes questionnaire facilitative in identifying functional needs which can be addressed with identification and implementation of appropriate cognitive aids/strategies. Pt reports challenges in schedule management, recalling information in conversations, and challenges performing vocational duties. Pt would benefit from skilled ST to address aforementioned deficits to improve QoL, enhance communication efficacy, and maximize independence.   OBJECTIVE IMPAIRMENTS: include attention, memory, executive functioning, and expressive language. These impairments are limiting patient from managing appointments, managing finances, household responsibilities, effectively communicating at home and in community, and successful participation in vocational duties . Factors affecting potential to achieve goals and functional outcome are co-morbidities and previous level of function. Patient will benefit from skilled SLP services to address above impairments and improve overall function.  REHAB  POTENTIAL: Good  PLAN:  SLP FREQUENCY: 2x/week  SLP DURATION: 7 weeks   PLANNED INTERVENTIONS: Cognitive reorganization, Internal/external aids, Oral motor exercises, Functional tasks, Multimodal communication approach, SLP instruction and feedback, and Compensatory strategies    [place], Student-SLP 11/14/2022, 9:32 AM

## 2022-11-19 ENCOUNTER — Ambulatory Visit: Payer: 59 | Admitting: Clinical

## 2022-11-19 ENCOUNTER — Ambulatory Visit: Payer: 59 | Admitting: Speech Pathology

## 2022-11-19 DIAGNOSIS — R4789 Other speech disturbances: Secondary | ICD-10-CM | POA: Diagnosis not present

## 2022-11-19 DIAGNOSIS — R41841 Cognitive communication deficit: Secondary | ICD-10-CM

## 2022-11-19 DIAGNOSIS — F840 Autistic disorder: Secondary | ICD-10-CM

## 2022-11-19 DIAGNOSIS — R4701 Aphasia: Secondary | ICD-10-CM | POA: Diagnosis not present

## 2022-11-19 NOTE — Progress Notes (Signed)
Time: 11:00 am-11:55 am CPT Code: 78469G-29 Diagnosis: F84.0  Jill Alexander was seen remotely using secure video conferencing. She was in her home and the therapist was in her office at the time of the appointment. Client is aware of the risks of telehealth and consented to a virtual visit. The video visit started several minutes late due to technical difficulties, but therapist was present at start of session and contacted Jill Alexander via phone to provide technical assistance. She reported that she has been offered another job, which she plans to accept. Session also focused on her relationship with her mother, which has changed as her mother has attempted to support her independence, as well as difficulty feeling happy for others. She is scheduled to be seen again in one week.  Intake Presenting Problem Jill Alexander presented seeking CBT to help her manage difficulties with emotion regulation and reframing negative thought patterns that contribute to anxiety and worry. Jill Alexander has a diagnosis of Autism Spectrum Disorder (ASD). Symptoms feeling nervous or on edge, difficulty controlling worry, worrying about a variety of things, difficulty relaxing, irritability, fears for the future, feelings of hopelessness, difficulty sleeping, feeling tired, appetite irregularities, poor self-image, passive suicidal ideation without plan or intent (client contracted for safety during initial session) History of Problem  Jill Alexander reported that she moved from [place] with her family in July of 2018. Since that time, she has been seen by a provider in the area for challenges with emotion regulation and anxiety related to her diagnosis of autism spectrum disorder. Jill Alexander lives with her parents. She has a 70 year-old sister who has struggled with similar issues, as well as a brother who is three years older than she is and lives in [place]. Jill Alexander described a long history of therapy and therapeutic programs in order to help her manage these  issues, including a partial hospitalization program in December of 2015.  Recent Trigger  Jill Alexander reported that she was seeking a change in pace with regard to her therapeutic treatment in order to ensure continued progress.  Marital and Family Information  Present family concerns/problems: Jill Alexander shared several accounts of disputes with her mother, including her mother becoming upset with her for sharing worries with her immediately after her mother has woken up, despite her mother's requests that she not do so.  Strengths/resources in the family/friends: Jill Alexander described overall supportive relationships with her parents.  Marital/sexual history patterns: N/A  Family of Origin  Problems in family of origin: None reported.  Family background / ethnic factors: none reported.  No needs/concerns related to ethnicity reported when asked: No  Education/Vocation  Interpersonal concerns/problems: Jill Alexander reported that she has difficulties with social interactions related to her ASD diagnosis, and often perseverates about perceived missteps in both the immediate and distant past.  Personal strengths: Jill Alexander presented as motivated to improve her situation, and with some insight as to what is effective for her.  Military/work problems/concerns: None noted.  Leisure Activities/Daily Functioning  impaired functioning  Legal Status  No Legal Problems  Medical/Nutritional Concerns  unspecified  Comments: Jill Alexander reported that she often perseverates on perceived medical problems, and described herself as a hypochondriac  Religion/Spirituality  Not reported  Other  General Behavior: cooperative  Attire: appropriate  Gait: normal  Motor Activity: normal  Stream of Thought - Productivity: spontaneous  Stream of thought - Progression: normal  Stream of thought - Language: normal  Emotional tone and reactions - Affect: appropriate  Mental trend/Content of thoughts - Perception: normal  Mental trend/Content of  thoughts - Orientation: normal  Mental trend/Content of thoughts - Memory: normal  Mental trend/Content of thoughts - General knowledge: consistent with education  Insight: good  Judgment: good  Intelligence: average  Diagnostic Summary  Autism Spectrum Disorder (F84.0)   [facility] Counselor/Therapist Progress Note   Patient ID: Jill Alexander, MRN: [id number],     Individualized Treatment Plan Strengths: Enjoys reading, Seeking counseling, In training program through [employer], "I like humor and jokes"  Supports: Her dog, her bestfriend since childhood, her parents, her sister    Goal/Needs for Treatment:  In order of importance to patient 1) Emotional Regulation/Anxiety 2) Improve Positive self talk      Client Statement of Needs: "Find ways of being nicer to myself", cope w/ anxiety and worry    Treatment Level:Outpatient Counseling  Symptoms:Anxiety, Perseverative worry, Negative self talk  Client Treatment Preferences:weekly counseling,  Prefers to be called Jill Alexander    Healthcare consumer's goal for treatment:  Dr. Charlyne Mom will support the patient's ability to achieve the goals identified. Cognitive Behavioral Therapy, Supportive Counseling, Mindfulness and Relaxation Strategies and other evidenced-based practices will be used to promote progress towards healthy functioning.    Healthcare consumer will: Actively participate in therapy, working towards healthy functioning.     *Justification for Continuation/Discontinuation of Goal: R=Revised, O=Ongoing, A=Achieved, D=Discontinued   Goal 1) Jill Alexander will develop strategies to regulate her emotions and improve her positive self talk Likert rating baseline date 04/12/21: How Easy - 3; How Often - 50% Target Date Goal Was reviewed Status Code Progress towards goal/Likert rating  05/02/23   New Ongoing                      Jill Alexander participated in development of tx plan and provided verbal consent.      Chrissie Noa, PhD               Chrissie Noa, PhD               Chrissie Noa, PhD               Chrissie Noa, PhDTime: 11:00 am-11:55 am CPT Code: 78295A Diagnosis: F84.0  Jill Alexander was seen in person for therapy. She reported that she had overslept and had nearly not made it to her session on time, and as a result had not taken her medication that morning. Jill Alexander's processing appeared especially slow, and she especially struggled with word retrieval without session. She reported that she has started seeing a new provider for medication management, and had been placed on a low dose of stimulant medication. She expressed continued frustrations with work, and reflected upon an [place]. Therapist pointed out maladaptive cognitions and offered alternate perspectives. She is scheduled to be seen again in one week.  Intake Presenting Problem Jill Alexander presented seeking CBT to help her manage difficulties with emotion regulation and reframing negative thought patterns that contribute to anxiety and worry. Jill Alexander has a diagnosis of Autism Spectrum Disorder (ASD). Symptoms feeling nervous or on edge, difficulty controlling worry, worrying about a variety of things, difficulty relaxing, irritability, fears for the future, feelings of hopelessness, difficulty sleeping, feeling tired, appetite irregularities, poor self-image, passive suicidal ideation without plan or intent (client contracted for safety during initial session) History of Problem  Jill Alexander reported that she moved from [place] with her family in July of 2018. Since that time, she has been seen by a provider in the  area for challenges with emotion regulation and anxiety related to her diagnosis of autism spectrum disorder. Jill Alexander lives with her parents. She has a 22 year-old sister who has struggled with similar issues, as well as a brother who is three years older than  she is and lives in [place]. Jill Alexander described a long history of therapy and therapeutic programs in order to help her manage these issues, including a partial hospitalization program in December of 2015.  Recent Trigger  Jill Alexander reported that she was seeking a change in pace with regard to her therapeutic treatment in order to ensure continued progress.  Marital and Family Information  Present family concerns/problems: Jill Alexander shared several accounts of disputes with her mother, including her mother becoming upset with her for sharing worries with her immediately after her mother has woken up, despite her mother's requests that she not do so.  Strengths/resources in the family/friends: Jill Alexander described overall supportive relationships with her parents.  Marital/sexual history patterns: N/A  Family of Origin  Problems in family of origin: None reported.  Family background / ethnic factors: none reported.  No needs/concerns related to ethnicity reported when asked: No  Education/Vocation  Interpersonal concerns/problems: Jill Alexander reported that she has difficulties with social interactions related to her ASD diagnosis, and often perseverates about perceived missteps in both the immediate and distant past.  Personal strengths: Jill Alexander presented as motivated to improve her situation, and with some insight as to what is effective for her.  Military/work problems/concerns: None noted.  Leisure Activities/Daily Functioning  impaired functioning  Legal Status  No Legal Problems  Medical/Nutritional Concerns  unspecified  Comments: Jill Alexander reported that she often perseverates on perceived medical problems, and described herself as a hypochondriac  Religion/Spirituality  Not reported  Other  General Behavior: cooperative  Attire: appropriate  Gait: normal  Motor Activity: normal  Stream of Thought - Productivity: spontaneous  Stream of thought - Progression: normal  Stream of thought - Language: normal   Emotional tone and reactions - Affect: appropriate  Mental trend/Content of thoughts - Perception: normal  Mental trend/Content of thoughts - Orientation: normal  Mental trend/Content of thoughts - Memory: normal  Mental trend/Content of thoughts - General knowledge: consistent with education  Insight: good  Judgment: good  Intelligence: average  Diagnostic Summary  Autism Spectrum Disorder (F84.0)   [facility] Counselor/Therapist Progress Note   Patient ID: Jill Alexander, MRN: [id number],     Individualized Treatment Plan Strengths: Enjoys reading, Seeking counseling, In training program through [employer], "I like humor and jokes"  Supports: Her dog, her bestfriend since childhood, her parents, her sister    Goal/Needs for Treatment:  In order of importance to patient 1) Emotional Regulation/Anxiety 2) Improve Positive self talk      Client Statement of Needs: "Find ways of being nicer to myself", cope w/ anxiety and worry    Treatment Level:Outpatient Counseling  Symptoms:Anxiety, Perseverative worry, Negative self talk  Client Treatment Preferences:weekly counseling,  Prefers to be called Jill Alexander    Healthcare consumer's goal for treatment:  Dr. Charlyne Mom will support the patient's ability to achieve the goals identified. Cognitive Behavioral Therapy, Supportive Counseling, Mindfulness and Relaxation Strategies and other evidenced-based practices will be used to promote progress towards healthy functioning.    Healthcare consumer will: Actively participate in therapy, working towards healthy functioning.     *Justification for Continuation/Discontinuation of Goal: R=Revised, O=Ongoing, A=Achieved, D=Discontinued   Goal 1) Jill Alexander will develop strategies to regulate her emotions and improve her  positive self talk Likert rating baseline date 04/12/21: How Easy - 3; How Often - 50% Target Date Goal Was reviewed Status Code Progress towards goal/Likert  rating  05/02/23   New Ongoing                      Jill Alexander participated in development of tx plan and provided verbal consent.     Chrissie Noa, PhD               Chrissie Noa, PhD               Chrissie Noa, PhD               Chrissie Noa, PhD               Chrissie Noa, PhD               Chrissie Noa, PhD               Chrissie Noa, PhD

## 2022-11-19 NOTE — Therapy (Signed)
OUTPATIENT SPEECH LANGUAGE PATHOLOGY TREATMENT   Patient Name: Jill Alexander MRN: [id number] DOB:[birth year], 27 y.o., female Today's Date: 11/19/2022  PCP: Lexine Baton REFERRING PROVIDER: Melvyn Novas, MD  END OF SESSION:  End of Session - 11/19/22 0929     Visit Number 7    Number of Visits 13    Date for SLP Re-Evaluation 12/05/22    Authorization Type CHEP    SLP Start Time 0930    SLP Stop Time  1015    SLP Time Calculation (min) 45 min    Activity Tolerance Patient tolerated treatment well              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   Past Surgical History:  Procedure Laterality Date   none     Patient Active Problem List   Diagnosis Date Noted   Excessive daytime sleepiness 03/18/2022   Non-restorative sleep 03/18/2022   Chronic fatigue and malaise 03/18/2022   Loud snoring 03/18/2022   Chiari malformation type I (HCC) 03/18/2022   GAD (generalized anxiety disorder) 02/06/2022   MDD (major depressive disorder), recurrent severe, 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   Women's annual routine gynecological examination 12/25/2021   Encounter for birth control pills maintenance 11/22/2021   History of Chiari malformation 09/26/2021   Urinary urgency 09/26/2021   Common migraine with intractable migraine 07/01/2019   Intermittent headache 10/26/2018   Epigastric pain 10/26/2018   Gastroesophageal reflux disease 10/26/2018   Bipolar 2 disorder (HCC) 06/02/2018   Panic attacks 06/02/2018   Autism spectrum disorder 05/06/2018   Anxiety and depression 05/06/2018   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   Seizures (HCC) 04/09/2017   Tremor, 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     ONSET DATE: 10/15/22   REFERRING DIAG: R47.01 (ICD-10-CM) - Aphasia  THERAPY DIAG:  Cognitive communication deficit  Rationale for Evaluation and Treatment: Rehabilitation  SUBJECTIVE:   SUBJECTIVE STATEMENT: "Someone in my life just got injured." Pt accompanied by: self  PERTINENT HISTORY: PMH remarkable for Anxiety, Autism, Chronic nausea, Common migraine with intractable migraine (07/01/2019), Depression, Dyspepsia, GERD (gastroesophageal reflux disease), Headache, Heart murmur, Seizures (HCC), and Tremor, essential (04/09/2017).   PAIN:  Are you having pain? No  FALLS: Has patient fallen in last 6 months?  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   PATIENT GOALS: "to remember what people say."  OBJECTIVE:   DIAGNOSTIC FINDINGS:07/24/22 MR BRAIN IMPRESSION: MRI scan of the brain with and without contrast showing no significant abnormalities in the brain.  Incidental finding of type I Arnold-Chiari malformation with crowding of the craniovertebral junction but no associated brainstem deformities or syringomyelia is noted.    COGNITION: Overall cognitive status: Impaired Areas of impairment:  Attention: Impaired: Focused, Alternating, Divided Memory: Impaired: Working Short term Long term Designer, fashion/clothing function: Impaired: Initiation and Slow processing Functional deficits: Patient presents with limited word finding, memory and endorses attention issues.   AUDITORY COMPREHENSION: Overall auditory comprehension: Appears intact YES/NO questions: Appears intact Following directions: Appears intact Conversation: Simple, Complex,  and patient requires direct language. Interfering components: attention, anxiety, and processing speed Effective technique: extra  processing time, repetition/stressing words, and visual/gestural cues  READING COMPREHENSION: Intact  EXPRESSION: verbal  VERBAL EXPRESSION: Level of generative/spontaneous verbalization: word, phrase, sentence, and conversation Interfering components: attention Effective technique: open ended questions, semantic cues, sentence completion, phonemic cues, and written cues Non-verbal means of communication: N/A Global Coherence Discourse score (Leaman & Ty Hilts, 2020): 2.1 Comments: Patient demonstrating frequent halting and use of filler words, which she endorses is 2/2 inability to generate needed word. Naming marked by prolonged pause, occasional self-corrected semantic paraphasia.   MOTOR SPEECH: Overall motor speech: Appears intact Level of impairment: Word, Phrase, Sentence, and Conversation Respiration: clavicular breathing, speaking on residual capacity, and patient presented with anxiety.  Phonation: normal Resonance: WFL Articulation: Appears intact Intelligibility: Intelligible  STANDARDIZED ASSESSMENTS: [place] NAMING TEST: 40/40 x4 semantic paraphasias with self-correction. Usual extended pause prior to naming.   PATIENT REPORTED OUTCOME MEASURES (PROM): MMQ Memory Mistakes: 66 "All the time:" misplace commonly used items, forget an appointment, forget what about to do, forget to run an errand, difficulty with word finding, forgetting what about to say in conversation "Often:" forgetting names, leaving things behind, forget to pass on message, forget to buy something needed "Sometimes:" recalling details read, forgetting medication, retelling story or joke  TODAY'S TREATMENT:                                                                                                                                         DATE:   11/19/22: Patient demonstrated with frustration due to outside matters. SLP provided brain dump of and led patient through organizing things she can and can't  control. Patient required frequent mod-A of finding things within her control and wrote functional goals to increase independence. Targeted memory through education through sleep hygiene, led through nightly routine. HEP to implement nightly routine.    11/14/22: Patient presented Patient stated that she was confused with therapeutic journaling, and SLP clarified to read and re-write same event 4 times. Patient led through therapeutic journaling today, and encouraged to discuss practice with health care worker. Re-education provided that SLP goal was to provide a tool which can be used if found of benefit, and recommendation to review strategy with mental health practitioner. Addressed d/t impact mental health can have on attention, memory, ex function, and task initiation/participation. Discussed memory aids, patient states that she currently uses [employer] calendar for appointments, My Chart. Education provided on attention in regard with memory, and to repeat things back to them, or write it down. Patient led through role play of ordering coffee with frequent min-A of guiding questions and prompting to repeat back information. Patient's memory increased when writing down information and reading it back. Targeted word finding through structured task, of finding similarities and differences between items, patient required frequent mod-A to remain on topic and semantic and  phonic cues for word finding.   11/07/22: Patient states frustration with loud household with dog, education on noise canceling headphones to decrease sound. SLP led through memory aids and provided education on using planners on paper vs electronic. Patient states that she was unable to complete therapeutic journaling at home, briefly led pt through therapeutic writing task to target language and familiarization with too.Marland Kitchen Re-education provided with usual reference to prior written education provided to enable pt's practice, as pt continuing to  report low mood impacting participation and initiation. Education provided on how managing mental health can have positive impact on cognitive functioning. All questions answered to patient satisfaction. ST will target utilization of memory aids.   11/06/22: Patient came in demonstrating low mood by hanging head low and not answering direct question. Stated that she has clinical depression and is struggling currently with anxiety and depression and that she has tried journaling and that it helped a little. SLP educated through "Therapeutic Journaling" to journal with more intention. Recommended pt discuss protocol with mental health professional to assess if it would be a good fit. Education on tracking mood throughout the day to collect data on mood to better manage it. Patient states that listening to podcasts, musical, and reading help pull her out of her depression. Patient advocated for herself to change her appointment from Friday to Thursday due to work conflict.   10/24/22: Patient states that she took her medication last night and this morning, but did not check the box to keep, but that she will try to implement that practice going forward. She states that going over memory strategies from previous session was helpful and that she enjoys repetition to help her retain/remember details in information. SLP initiated addressing challenges in performance of work duties. Pt had difficulty determining needs, explanation difficult to following d/t pt's verbosity and mazing. 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 SLP recommended journaling, and brain dumps for patient in order to delegate cognitive load to external aids.     10/23/22: SLP targeted remembering to take medication with external memory aids. Patient states that her mother puts her pills in the container, but that she often forgets to take them, and does not  just her memory if she did take them. Patient endorses that she will forget when she does not pay attention and "feels like a robot" when completing tasks.SLP guided putting repetitive alarms on patients phone as well as a check in sheet to keep by the medication for additional accountability.  Education given on different external and internal memory aids, and questions answered to patient satisfaction. Patient's speech continues to be tangential during the session, and she needs direct, explicit language. Patient states frustration with work, and wants to improve. She is encouraged to ask her boss for feedback.   10/17/22: Patient demonstrates with anxiety as demonstrated by shaking, and thoracic breathing. Patient states struggles with memory, attention, and word finding. Unstructured discourse patient presented with staggered, tangential speech, she benefited from simple, direct questions. Verbal cues required for topic maintenance required to facilitate evaluation. Education provided on side affects of Topamax as it notes side effects for challenges in attention, memory, and word finding. Patient will reach out to her neurologist to see if a different medication will work best for her. Education provided on evaluation results and SLP's recommendations. Initiated training re: use of external cognitive aids. Pt verbalizes agreement with POC,  all questions answered to satisfaction.   PATIENT EDUCATION: Education details: See above  Person educated: Patient Education method: Explanation Education comprehension: verbalized understanding  GOALS: Goals reviewed with patient? Yes  SHORT TERM GOALS: Target date: 11/14/2022  Patient will successfully use external aids to address x3 functional memory challenges with min-A Baseline: Goal status: MET   2.  Patient will demonstrate word finding compensation strategies with 80% accuracy during structured tasks with occasional min-A, Baseline:  Goal status:  MET  3.  Pt will demonstrate processing and working memory strategies during role play, resulting in recall of spoken information with 80% accuracy  Baseline:  Goal status:  Partially Met   LONG TERM GOALS: Target date: 12/05/2022  Pt will verbalize x4 examples of carryover of memory strategies and compensations  Baseline:  Goal status: IN PROGRESS  2.  Pt will report reduced memory mistakes via PROM by dc  Baseline: 53 Goal status: IN PROGRESS  3.  Pt will participate in structured 15 minute generative language task with use of anomia and discourse strategies/compensations PRN, with global discourse score of 2.8 or greater  Baseline: 2.1 Goal status: IN PROGRESS  ASSESSMENT:  CLINICAL IMPRESSION: Patient is a 27 y.o. F who was seen today for cognitive linguistic and aphasia evaluation upon referral from Dr. Vickey Huger (neurology). Evaluation reveals moderate challenges in verbal expression, c/b halting speech 2/2 word finding with usual mazing and decreased topic maintenance. Deficits result in decreased discourse cohesion. Communication partner required to maintain extended attention and following tangential speech to determine topic and important aspects of pt's intended message. Pt's endorses memory and attention challenges first noted in 2022 but worsening within the past year. Memory mistakes questionnaire facilitative in identifying functional needs which can be addressed with identification and implementation of appropriate cognitive aids/strategies. Pt reports challenges in schedule management, recalling information in conversations, and challenges performing vocational duties. Pt would benefit from skilled ST to address aforementioned deficits to improve QoL, enhance communication efficacy, and maximize independence.   OBJECTIVE IMPAIRMENTS: include attention, memory, executive functioning, and expressive language. These impairments are limiting patient from managing appointments,  managing finances, household responsibilities, effectively communicating at home and in community, and successful participation in vocational duties . Factors affecting potential to achieve goals and functional outcome are co-morbidities and previous level of function. Patient will benefit from skilled SLP services to address above impairments and improve overall function.  REHAB POTENTIAL: Good  PLAN:  SLP FREQUENCY: 2x/week  SLP DURATION: 7 weeks   PLANNED INTERVENTIONS: Cognitive reorganization, Internal/external aids, Oral motor exercises, Functional tasks, Multimodal communication approach, SLP instruction and feedback, and Compensatory strategies   [place], Student-SLP 11/19/2022, 9:29 AM

## 2022-11-19 NOTE — Patient Instructions (Signed)
Controlling Health Eating Healthy  Take medication  Journaling Deep breathing  Mindfulness  Think of things you can and can't control.  Aromatherapy  Physical exercise  Baseline 3 days a week Goal: 30 minutes-- 4 days a week.  Staying hydrated  Baseline: 6 cups  Goal: 8-9 cups    Limit social media.   Sleep Hygiene  Create a routine around sleep  5-6 pm Dinner  6-8 pm Chores  8-9:30- Personal time (tv. Podcast, reading) 9:30--Tech off.  Get ready for bed  Take nightly meds  Shower  10:00 pm--go to bed.

## 2022-11-21 ENCOUNTER — Ambulatory Visit: Payer: 59 | Admitting: Speech Pathology

## 2022-11-21 DIAGNOSIS — R41841 Cognitive communication deficit: Secondary | ICD-10-CM | POA: Diagnosis not present

## 2022-11-21 DIAGNOSIS — R4789 Other speech disturbances: Secondary | ICD-10-CM | POA: Diagnosis not present

## 2022-11-21 DIAGNOSIS — R4701 Aphasia: Secondary | ICD-10-CM | POA: Diagnosis not present

## 2022-11-21 NOTE — Therapy (Signed)
OUTPATIENT SPEECH LANGUAGE PATHOLOGY TREATMENT   Patient Name: Jill Alexander MRN: [id number] DOB:05-12-1995, 27 y.o., female Today's Date: 11/21/2022  PCP: Lexine Baton REFERRING PROVIDER: Melvyn Novas, MD  END OF SESSION:  End of Session - 11/21/22 1102     Visit Number 8    Number of Visits 13    Date for SLP Re-Evaluation 12/05/22    Authorization Type CHEP    SLP Start Time 1102    SLP Stop Time  1147    SLP Time Calculation (min) 45 min    Activity Tolerance Patient tolerated treatment well               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   Past Surgical History:  Procedure Laterality Date   none     Patient Active Problem List   Diagnosis Date Noted   Excessive daytime sleepiness 03/18/2022   Non-restorative sleep 03/18/2022   Chronic fatigue and malaise 03/18/2022   Loud snoring 03/18/2022   Chiari malformation type I (HCC) 03/18/2022   GAD (generalized anxiety disorder) 02/06/2022   MDD (major depressive disorder), recurrent severe, 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   Women's annual routine gynecological examination 12/25/2021   Encounter for birth control pills maintenance 11/22/2021   History of Chiari malformation 09/26/2021   Urinary urgency 09/26/2021   Common migraine with intractable migraine 07/01/2019   Intermittent headache 10/26/2018   Epigastric pain 10/26/2018   Gastroesophageal reflux disease 10/26/2018   Bipolar 2 disorder (HCC) 06/02/2018   Panic attacks 06/02/2018   Autism spectrum disorder 05/06/2018   Anxiety and depression 05/06/2018   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   Seizures (HCC) 04/09/2017   Tremor, 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     ONSET DATE: 10/15/22   REFERRING DIAG: R47.01 (ICD-10-CM) - Aphasia  THERAPY DIAG:  Cognitive communication deficit  Rationale for Evaluation and Treatment: Rehabilitation  SUBJECTIVE:   SUBJECTIVE STATEMENT: "I got a new job, 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   PAIN:  Are you having pain? No  FALLS: Has patient fallen in last 6 months?  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   PATIENT GOALS: "to remember what people say."  OBJECTIVE:   DIAGNOSTIC FINDINGS:07/24/22 MR BRAIN IMPRESSION: MRI scan of the brain with and without contrast showing no significant abnormalities in the brain.  Incidental finding of type I Arnold-Chiari malformation with crowding of the craniovertebral junction but no associated brainstem deformities or syringomyelia is noted.    COGNITION: Overall cognitive status: Impaired Areas of impairment:  Attention: Impaired: Focused, Alternating, Divided Memory: Impaired: Working Short term Long term Designer, fashion/clothing function: Impaired: Initiation and Slow processing Functional deficits: Patient presents with limited word finding, memory and endorses attention issues.   AUDITORY COMPREHENSION: Overall auditory comprehension: Appears intact  YES/NO questions: Appears intact Following directions: Appears intact Conversation: Simple, Complex, and patient requires direct language. Interfering components: attention, anxiety, and processing  speed Effective technique: extra processing time, repetition/stressing words, and visual/gestural cues  READING COMPREHENSION: Intact  EXPRESSION: verbal  VERBAL EXPRESSION: Level of generative/spontaneous verbalization: word, phrase, sentence, and conversation Interfering components: attention Effective technique: open ended questions, semantic cues, sentence completion, phonemic cues, and written cues Non-verbal means of communication: N/A Global Coherence Discourse score (Leaman & Ty Hilts, 2020): 2.1 Comments: Patient demonstrating frequent halting and use of filler words, which she endorses is 2/2 inability to generate needed word. Naming marked by prolonged pause, occasional self-corrected semantic paraphasia.   MOTOR SPEECH: Overall motor speech: Appears intact Level of impairment: Word, Phrase, Sentence, and Conversation Respiration: clavicular breathing, speaking on residual capacity, and patient presented with anxiety.  Phonation: normal Resonance: WFL Articulation: Appears intact Intelligibility: Intelligible  STANDARDIZED ASSESSMENTS: [place] NAMING TEST: 40/40 x4 semantic paraphasias with self-correction. Usual extended pause prior to naming.   PATIENT REPORTED OUTCOME MEASURES (PROM): MMQ Memory Mistakes: 73 "All the time:" misplace commonly used items, forget an appointment, forget what about to do, forget to run an errand, difficulty with word finding, forgetting what about to say in conversation "Often:" forgetting names, leaving things behind, forget to pass on message, forget to buy something needed "Sometimes:" recalling details read, forgetting medication, retelling story or joke  TODAY'S TREATMENT:                                                                                                                                         DATE:   11/21/22: Targeted functional goals through role play for new job.Patient required frequent mod-A of redirection and  education on using short hand to write orders. Guidance provided on non-verbal cues customers may give to indicate help. 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 Patient required frequent mod-A of frequent feedback, models, and semantic cues. HEP given to complete short hand for menu and bring to ST next session. Patient encouraged to modify the plan as needed if it is not functional in practice.   11/19/22: Patient demonstrated with frustration due to outside matters. SLP provided brain dump of and led patient through organizing things she can and can't control. Patient required frequent mod-A of finding things within her control and wrote functional goals to increase independence. Targeted memory through education through sleep hygiene, led through nightly routine. HEP to implement nightly routine.    11/14/22: Patient presented Patient stated that she was confused with therapeutic journaling, and SLP clarified to read and re-write same event 4 times. Patient led through therapeutic journaling today, and encouraged to discuss practice with health care worker. Re-education provided that SLP goal was to provide a tool which can be used if found of benefit, and recommendation to review strategy with mental health practitioner. Addressed d/t impact mental health can  have on attention, memory, ex function, and task initiation/participation. Discussed memory aids, patient states that she currently uses [employer] calendar for appointments, My Chart. Education provided on attention in regard with memory, and to repeat things back to them, or write it down. Patient led through role play of ordering coffee with frequent min-A of guiding questions and prompting to repeat back information. Patient's memory increased when writing down information and reading it back. Targeted word finding through structured task, of finding similarities and differences between items, patient required frequent mod-A  to remain on topic and semantic and phonic cues for word finding.   11/07/22: Patient states frustration with loud household with dog, education on noise canceling headphones to decrease sound. SLP led through memory aids and provided education on using planners on paper vs electronic. Patient states that she was unable to complete therapeutic journaling at home, briefly led pt through therapeutic writing task to target language and familiarization with too.Marland Kitchen Re-education provided with usual reference to prior written education provided to enable pt's practice, as pt continuing to report low mood impacting participation and initiation. Education provided on how managing mental health can have positive impact on cognitive functioning. All questions answered to patient satisfaction. ST will target utilization of memory aids.   11/06/22: Patient came in demonstrating low mood by hanging head low and not answering direct question. Stated that she has clinical depression and is struggling currently with anxiety and depression and that she has tried journaling and that it helped a little. SLP educated through "Therapeutic Journaling" to journal with more intention. Recommended pt discuss protocol with mental health professional to assess if it would be a good fit. Education on tracking mood throughout the day to collect data on mood to better manage it. Patient states that listening to podcasts, musical, and reading help pull her out of her depression. Patient advocated for herself to change her appointment from Friday to Thursday due to work conflict.   10/24/22: Patient states that she took her medication last night and this morning, but did not check the box to keep, but that she will try to implement that practice going forward. She states that going over memory strategies from previous session was helpful and that she enjoys repetition to help her retain/remember details in information. SLP initiated addressing  challenges in performance of work duties. Pt had difficulty determining needs, explanation difficult to following d/t pt's verbosity and mazing. 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 SLP recommended journaling, and brain dumps for patient in order to delegate cognitive load to external aids.     10/23/22: SLP targeted remembering to take medication with external memory aids. Patient states that her mother puts her pills in the container, but that she often forgets to take them, and does not just her memory if she did take them. Patient endorses that she will forget when she does not pay attention and "feels like a robot" when completing tasks.SLP guided putting repetitive alarms on patients phone as well as a check in sheet to keep by the medication for additional accountability.  Education given on different external and internal memory aids, and questions answered to patient satisfaction. Patient's speech continues to be tangential during the session, and she needs direct, explicit language. Patient states frustration with work, and wants to improve. She is encouraged to ask her boss for feedback.   10/17/22: Patient demonstrates with anxiety as  demonstrated by shaking, and thoracic breathing. Patient states struggles with memory, attention, and word finding. Unstructured discourse patient presented with staggered, tangential speech, she benefited from simple, direct questions. Verbal cues required for topic maintenance required to facilitate evaluation. Education provided on side affects of Topamax as it notes side effects for challenges in attention, memory, and word finding. Patient will reach out to her neurologist to see if a different medication will work best for her. Education provided on evaluation results and SLP's recommendations. Initiated training re: use of external cognitive aids. Pt verbalizes agreement  with POC, all questions answered to satisfaction.   PATIENT EDUCATION: Education details: See above  Person educated: Patient Education method: Explanation Education comprehension: verbalized understanding  GOALS: Goals reviewed with patient? Yes  SHORT TERM GOALS: Target date: 11/14/2022  Patient will successfully use external aids to address x3 functional memory challenges with min-A Baseline: Goal status: MET   2.  Patient will demonstrate word finding compensation strategies with 80% accuracy during structured tasks with occasional min-A, Baseline:  Goal status: MET  3.  Pt will demonstrate processing and working memory strategies during role play, resulting in recall of spoken information with 80% accuracy  Baseline:  Goal status:  Partially Met   LONG TERM GOALS: Target date: 12/05/2022  Pt will verbalize x4 examples of carryover of memory strategies and compensations  Baseline:  Goal status: IN PROGRESS  2.  Pt will report reduced memory mistakes via PROM by dc  Baseline: 53 Goal status: IN PROGRESS  3.  Pt will participate in structured 15 minute generative language task with use of anomia and discourse strategies/compensations PRN, with global discourse score of 2.8 or greater  Baseline: 2.1 Goal status: IN PROGRESS  ASSESSMENT:  CLINICAL IMPRESSION: Patient is a 27 y.o. F who was seen today for cognitive linguistic and aphasia evaluation upon referral from Dr. Vickey Huger (neurology). Evaluation reveals moderate challenges in verbal expression, c/b halting speech 2/2 word finding with usual mazing and decreased topic maintenance. Deficits result in decreased discourse cohesion. Communication partner required to maintain extended attention and following tangential speech to determine topic and important aspects of pt's intended message. Pt's endorses memory and attention challenges first noted in 2022 but worsening within the past year. Memory mistakes questionnaire  facilitative in identifying functional needs which can be addressed with identification and implementation of appropriate cognitive aids/strategies. Pt reports challenges in schedule management, recalling information in conversations, and challenges performing vocational duties. Pt would benefit from skilled ST to address aforementioned deficits to improve QoL, enhance communication efficacy, and maximize independence.   OBJECTIVE IMPAIRMENTS: include attention, memory, executive functioning, and expressive language. These impairments are limiting patient from managing appointments, managing finances, household responsibilities, effectively communicating at home and in community, and successful participation in vocational duties . Factors affecting potential to achieve goals and functional outcome are co-morbidities and previous level of function. Patient will benefit from skilled SLP services to address above impairments and improve overall function.  REHAB POTENTIAL: Good  PLAN:  SLP FREQUENCY: 2x/week  SLP DURATION: 7 weeks   PLANNED INTERVENTIONS: Cognitive reorganization, Internal/external aids, Oral motor exercises, Functional tasks, Multimodal communication approach, SLP instruction and feedback, and Compensatory strategies   [place], Student-SLP 11/21/2022, 11:02 AM

## 2022-11-26 ENCOUNTER — Ambulatory Visit (INDEPENDENT_AMBULATORY_CARE_PROVIDER_SITE_OTHER): Payer: 59 | Admitting: Clinical

## 2022-11-26 DIAGNOSIS — F840 Autistic disorder: Secondary | ICD-10-CM

## 2022-11-26 NOTE — Progress Notes (Signed)
Time: 11:00 am-11:55 am CPT Code: 78469G-29 Diagnosis: F84.0  Jill Alexander was seen remotely using secure video conferencing. She was in her home and the therapist was in her office at the time of the appointment. Client is aware of the risks of telehealth and consented to a virtual visit. The video visit started several minutes late due to technical difficulties, but therapist was present at start of session and contacted Jill Alexander via phone to provide technical assistance. She reported an increase in depressed mood, which she attributed to having started a stimulant medication to manage ADHD symptoms. She has reached out to her psychiatrist and is tapering off of prilosec at the advice of her psychiatrist to see if this helps with mood management. She had not yet noticed a benefit, but reported that she had only begun tapering off the previous week. Suicidal ideation without plan or intent was reported since she noticed the depressed mood. Reported thoughts included, "It's never going to be better," and "I'll just end up alone, so there's no point," "I just want to quit," "no one would miss me," and "I just want to give up." She has been practicing affirmations, and this has been helpful. For homework, she will continue practicing affirmations. She indicated that she is able to follow her coping plan. She is scheduled to be seen again in one week.  Intake Presenting Problem Jill Alexander presented seeking CBT to help her manage difficulties with emotion regulation and reframing negative thought patterns that contribute to anxiety and worry. Jill Alexander has a diagnosis of Autism Spectrum Disorder (ASD). Symptoms feeling nervous or on edge, difficulty controlling worry, worrying about a variety of things, difficulty relaxing, irritability, fears for the future, feelings of hopelessness, difficulty sleeping, feeling tired, appetite irregularities, poor self-image, passive suicidal ideation without plan or intent (client contracted  for safety during initial session) History of Problem  Jill Alexander reported that she moved from [place] with her family in July of 2018. Since that time, she has been seen by a provider in the area for challenges with emotion regulation and anxiety related to her diagnosis of autism spectrum disorder. Jill Alexander lives with her parents. She has a 68 year-old sister who has struggled with similar issues, as well as a brother who is three years older than she is and lives in [place]. Jill Alexander described a long history of therapy and therapeutic programs in order to help her manage these issues, including a partial hospitalization program in December of 2015.  Recent Trigger  Jill Alexander reported that she was seeking a change in pace with regard to her therapeutic treatment in order to ensure continued progress.  Marital and Family Information  Present family concerns/problems: Jill Alexander shared several accounts of disputes with her mother, including her mother becoming upset with her for sharing worries with her immediately after her mother has woken up, despite her mother's requests that she not do so.  Strengths/resources in the family/friends: Jill Alexander described overall supportive relationships with her parents.  Marital/sexual history patterns: N/A  Family of Origin  Problems in family of origin: None reported.  Family background / ethnic factors: none reported.  No needs/concerns related to ethnicity reported when asked: No  Education/Vocation  Interpersonal concerns/problems: Jill Alexander reported that she has difficulties with social interactions related to her ASD diagnosis, and often perseverates about perceived missteps in both the immediate and distant past.  Personal strengths: Jill Alexander presented as motivated to improve her situation, and with some insight as to what is effective for her.  Military/work problems/concerns:  None noted.  Leisure Activities/Daily Functioning  impaired functioning  Legal Status  No Legal Problems   Medical/Nutritional Concerns  unspecified  Comments: Jill Alexander reported that she often perseverates on perceived medical problems, and described herself as a hypochondriac  Religion/Spirituality  Not reported  Other  General Behavior: cooperative  Attire: appropriate  Gait: normal  Motor Activity: normal  Stream of Thought - Productivity: spontaneous  Stream of thought - Progression: normal  Stream of thought - Language: normal  Emotional tone and reactions - Affect: appropriate  Mental trend/Content of thoughts - Perception: normal  Mental trend/Content of thoughts - Orientation: normal  Mental trend/Content of thoughts - Memory: normal  Mental trend/Content of thoughts - General knowledge: consistent with education  Insight: good  Judgment: good  Intelligence: average  Diagnostic Summary  Autism Spectrum Disorder (F84.0)   [facility] Counselor/Therapist Progress Note   Patient ID: Adaeze Brashier, MRN: [id number],     Individualized Treatment Plan Strengths: Enjoys reading, Seeking counseling, In training program through [employer], "I like humor and jokes"  Supports: Her dog, her bestfriend since childhood, her parents, her sister    Goal/Needs for Treatment:  In order of importance to patient 1) Emotional Regulation/Anxiety 2) Improve Positive self talk      Client Statement of Needs: "Find ways of being nicer to myself", cope w/ anxiety and worry    Treatment Level:Outpatient Counseling  Symptoms:Anxiety, Perseverative worry, Negative self talk  Client Treatment Preferences:weekly counseling,  Prefers to be called Jill Alexander    Healthcare consumer's goal for treatment:  Dr. Charlyne Mom will support the patient's ability to achieve the goals identified. Cognitive Behavioral Therapy, Supportive Counseling, Mindfulness and Relaxation Strategies and other evidenced-based practices will be used to promote progress towards healthy functioning.    Healthcare  consumer will: Actively participate in therapy, working towards healthy functioning.     *Justification for Continuation/Discontinuation of Goal: R=Revised, O=Ongoing, A=Achieved, D=Discontinued   Goal 1) Jill Alexander will develop strategies to regulate her emotions and improve her positive self talk Likert rating baseline date 04/12/21: How Easy - 3; How Often - 50% Target Date Goal Was reviewed Status Code Progress towards goal/Likert rating  05/02/23   New Ongoing                      Jill Alexander participated in development of tx plan and provided verbal consent.     Chrissie Noa, PhD               Chrissie Noa, PhD               Chrissie Noa, PhD               Chrissie Noa, PhDTime: 11:00 am-11:55 am CPT Code: [id number] Diagnosis: F84.0  Jill Alexander was seen in person for therapy. She reported that she had overslept and had nearly not made it to her session on time, and as a result had not taken her medication that morning. Jill Alexander's processing appeared especially slow, and she especially struggled with word retrieval without session. She reported that she has started seeing a new provider for medication management, and had been placed on a low dose of stimulant medication. She expressed continued frustrations with work, and reflected upon an upcoming trip [place]. Therapist pointed out maladaptive cognitions and offered alternate perspectives. She is scheduled to be seen again in one week.  Intake Presenting Problem Jill Alexander presented seeking CBT to help her  manage difficulties with emotion regulation and reframing negative thought patterns that contribute to anxiety and worry. Jill Alexander has a diagnosis of Autism Spectrum Disorder (ASD). Symptoms feeling nervous or on edge, difficulty controlling worry, worrying about a variety of things, difficulty relaxing, irritability, fears for the future, feelings of hopelessness, difficulty sleeping,  feeling tired, appetite irregularities, poor self-image, passive suicidal ideation without plan or intent (client contracted for safety during initial session) History of Problem  Jill Alexander reported that she moved from [place] with her family in July of 2018. Since that time, she has been seen by a provider in the area for challenges with emotion regulation and anxiety related to her diagnosis of autism spectrum disorder. Jill Alexander lives with her parents. She has a 41 year-old sister who has struggled with similar issues, as well as a brother who is three years older than she is and lives in [place]. Jill Alexander described a long history of therapy and therapeutic programs in order to help her manage these issues, including a partial hospitalization program in December of 2015.  Recent Trigger  Jill Alexander reported that she was seeking a change in pace with regard to her therapeutic treatment in order to ensure continued progress.  Marital and Family Information  Present family concerns/problems: Jill Alexander shared several accounts of disputes with her mother, including her mother becoming upset with her for sharing worries with her immediately after her mother has woken up, despite her mother's requests that she not do so.  Strengths/resources in the family/friends: Jill Alexander described overall supportive relationships with her parents.  Marital/sexual history patterns: N/A  Family of Origin  Problems in family of origin: None reported.  Family background / ethnic factors: none reported.  No needs/concerns related to ethnicity reported when asked: No  Education/Vocation  Interpersonal concerns/problems: Jill Alexander reported that she has difficulties with social interactions related to her ASD diagnosis, and often perseverates about perceived missteps in both the immediate and distant past.  Personal strengths: Jill Alexander presented as motivated to improve her situation, and with some insight as to what is effective for her.  Military/work  problems/concerns: None noted.  Leisure Activities/Daily Functioning  impaired functioning  Legal Status  No Legal Problems  Medical/Nutritional Concerns  unspecified  Comments: Jill Alexander reported that she often perseverates on perceived medical problems, and described herself as a hypochondriac  Religion/Spirituality  Not reported  Other  General Behavior: cooperative  Attire: appropriate  Gait: normal  Motor Activity: normal  Stream of Thought - Productivity: spontaneous  Stream of thought - Progression: normal  Stream of thought - Language: normal  Emotional tone and reactions - Affect: appropriate  Mental trend/Content of thoughts - Perception: normal  Mental trend/Content of thoughts - Orientation: normal  Mental trend/Content of thoughts - Memory: normal  Mental trend/Content of thoughts - General knowledge: consistent with education  Insight: good  Judgment: good  Intelligence: average  Diagnostic Summary  Autism Spectrum Disorder (F84.0)   [facility] Counselor/Therapist Progress Note   Patient ID: Layona Rossin, MRN: [id number],     Individualized Treatment Plan Strengths: Enjoys reading, Seeking counseling, In training program through [employer], "I like humor and jokes"  Supports: Her dog, her bestfriend since childhood, her parents, her sister    Goal/Needs for Treatment:  In order of importance to patient 1) Emotional Regulation/Anxiety 2) Improve Positive self talk      Client Statement of Needs: "Find ways of being nicer to myself", cope w/ anxiety and worry    Treatment Level:Outpatient Counseling  Symptoms:Anxiety,  Perseverative worry, Negative self talk  Client Treatment Preferences:weekly counseling,  Prefers to be called Jill Alexander    Healthcare consumer's goal for treatment:  Dr. Charlyne Mom will support the patient's ability to achieve the goals identified. Cognitive Behavioral Therapy, Supportive Counseling, Mindfulness and  Relaxation Strategies and other evidenced-based practices will be used to promote progress towards healthy functioning.    Healthcare consumer will: Actively participate in therapy, working towards healthy functioning.     *Justification for Continuation/Discontinuation of Goal: R=Revised, O=Ongoing, A=Achieved, D=Discontinued   Goal 1) Jill Alexander will develop strategies to regulate her emotions and improve her positive self talk Likert rating baseline date 04/12/21: How Easy - 3; How Often - 50% Target Date Goal Was reviewed Status Code Progress towards goal/Likert rating  05/02/23   New Ongoing                      Jill Alexander participated in development of tx plan and provided verbal consent.     Chrissie Noa, PhD               Chrissie Noa, PhD

## 2022-11-28 ENCOUNTER — Ambulatory Visit: Payer: 59 | Attending: Neurology | Admitting: Speech Pathology

## 2022-11-28 DIAGNOSIS — R4789 Other speech disturbances: Secondary | ICD-10-CM | POA: Diagnosis not present

## 2022-11-28 DIAGNOSIS — R4701 Aphasia: Secondary | ICD-10-CM | POA: Insufficient documentation

## 2022-11-28 DIAGNOSIS — R41841 Cognitive communication deficit: Secondary | ICD-10-CM | POA: Insufficient documentation

## 2022-11-28 NOTE — Therapy (Signed)
OUTPATIENT SPEECH LANGUAGE PATHOLOGY TREATMENT (DISCHARGE)   Patient Name: Jill Alexander MRN: [id number] DOB:September 30, 1995, 27 y.o., female Today's Date: 11/28/2022  PCP: Lexine Baton REFERRING PROVIDER: Melvyn Novas, MD  END OF SESSION:  End of Session - 11/28/22 1104     Visit Number 9    Number of Visits 13    Date for SLP Re-Evaluation 12/05/22    Authorization Type CHEP    SLP Start Time 1104    SLP Stop Time  1145    SLP Time Calculation (min) 41 min    Activity Tolerance Patient tolerated treatment well                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   Past Surgical History:  Procedure Laterality Date   none     Patient Active Problem List   Diagnosis Date Noted   Excessive daytime sleepiness 03/18/2022   Non-restorative sleep 03/18/2022   Chronic fatigue and malaise 03/18/2022   Loud snoring 03/18/2022   Chiari malformation type I (HCC) 03/18/2022   GAD (generalized anxiety disorder) 02/06/2022   MDD (major depressive disorder), recurrent severe, 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   Women's annual routine gynecological examination 12/25/2021   Encounter for birth control pills maintenance 11/22/2021   History of Chiari malformation 09/26/2021   Urinary urgency 09/26/2021   Common migraine with intractable migraine 07/01/2019   Intermittent headache 10/26/2018   Epigastric pain 10/26/2018   Gastroesophageal reflux disease 10/26/2018   Bipolar 2 disorder (HCC) 06/02/2018   Panic attacks 06/02/2018   Autism spectrum disorder 05/06/2018   Anxiety and depression 05/06/2018   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   PERTINENT HISTORY: PMH remarkable for Anxiety, Autism, Chronic nausea, Common migraine with intractable migraine (07/01/2019), Depression, Dyspepsia, GERD (gastroesophageal reflux disease), Headache, Heart murmur, Seizures (HCC), and Tremor, essential (04/09/2017).   PAIN:  Are you having pain? No  FALLS: Has patient fallen in last 6 months?  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   PATIENT GOALS: "to remember what people say."  OBJECTIVE:   DIAGNOSTIC FINDINGS:07/24/22 MR BRAIN IMPRESSION: MRI scan of the brain with and without contrast showing no significant abnormalities in the brain.  Incidental finding of type I Arnold-Chiari malformation with crowding of the craniovertebral junction but no associated brainstem deformities or syringomyelia is noted.    COGNITION: Overall cognitive status: Impaired Areas of impairment:  Attention: Impaired: Focused, Alternating, Divided Memory: Impaired: Working Short term Long term Designer, fashion/clothing function: Impaired: Initiation and Slow processing Functional deficits: Patient presents with limited word finding, memory and endorses attention issues.   AUDITORY COMPREHENSION: Overall auditory comprehension: Appears intact YES/NO questions: Appears  intact Following directions: Appears intact Conversation: Simple, Complex, and patient requires direct language. Interfering components: attention, anxiety, and processing  speed Effective technique: extra processing time, repetition/stressing words, and visual/gestural cues  READING COMPREHENSION: Intact  EXPRESSION: verbal  VERBAL EXPRESSION: Level of generative/spontaneous verbalization: word, phrase, sentence, and conversation Interfering components: attention Effective technique: open ended questions, semantic cues, sentence completion, phonemic cues, and written cues Non-verbal means of communication: N/A Global Coherence Discourse score (Leaman & Ty Hilts, 2020): 2.1 Comments: Patient demonstrating frequent halting and use of filler words, which she endorses is 2/2 inability to generate needed word. Naming marked by prolonged pause, occasional self-corrected semantic paraphasia.   MOTOR SPEECH: Overall motor speech: Appears intact Level of impairment: Word, Phrase, Sentence, and Conversation Respiration: clavicular breathing, speaking on residual capacity, and patient presented with anxiety.  Phonation: normal Resonance: WFL Articulation: Appears intact Intelligibility: Intelligible  STANDARDIZED ASSESSMENTS: [place] NAMING TEST: 40/40 x4 semantic paraphasias with self-correction. Usual extended pause prior to naming.   PATIENT REPORTED OUTCOME MEASURES (PROM): MMQ Memory Mistakes: 25 "All the time:" misplace commonly used items, forget an appointment, forget what about to do, forget to run an errand, difficulty with word finding, forgetting what about to say in conversation "Often:" forgetting names, leaving things behind, forget to pass on message, forget to buy something needed "Sometimes:" recalling details read, forgetting medication, retelling story or joke  TODAY'S TREATMENT:                                                                                                                                         DATE:   11/28/22: Addressed discourse cohesion and word finding at conversational level via constrained summarization using video  stimuli. Pt education provided regarding stop, think, speak and use of visual aid "conversation blocks" to aid in topic maintenance and decreased mazing and tangents which can impact clarity of patient's message. Pt requires usual cues for use of strategies. Pt endorses strategy may be helpful for communication with mother. Reviewed memory strategies and recommendations for use of faciliaitve strategies when new memory challenges are determined. Pt verbalizes appreciation for interventions, is agreeable to d/c.   PATIENT EDUCATION: Education details: See above  Person educated: Patient Education method: Explanation Education comprehension: verbalized understanding  GOALS: Goals reviewed with patient? Yes  SHORT TERM GOALS: Target date: 11/14/2022  Patient will successfully use external aids to address x3 functional memory challenges with min-A Baseline: Goal status: MET   2.  Patient will demonstrate word finding compensation strategies with 80% accuracy during structured tasks with occasional min-A, Baseline:  Goal status: MET  3.  Pt will demonstrate processing and working memory strategies during role play, resulting in recall of spoken information with 80% accuracy  Baseline:  Goal status:  Partially Met   LONG TERM GOALS: Target date: 12/05/2022  Pt will verbalize x4 examples of carryover of memory strategies and compensations  Baseline:  Goal status: MET  2.  Pt will report reduced memory mistakes via PROM by dc  Baseline: 53 Goal status: NOT MET d/t time constraints final session  3.  Pt will participate in structured 15 minute generative language task with use of anomia and discourse strategies/compensations PRN, with global discourse score of 2.8 or greater  Baseline: 2.1; 2.9 with mod-A Goal status: MET  ASSESSMENT:  CLINICAL IMPRESSION: Reviewed previously addressed strategies and compensations with pt demonstrating understanding via teach back. Pt is able to ID  functional application of targeted strategis with mod-I. Agreeable to d/c.   OBJECTIVE IMPAIRMENTS: include attention, memory, executive functioning, and expressive language. These impairments are limiting patient from managing appointments, managing finances, household responsibilities, effectively communicating at home and in community, and successful participation in vocational duties . Factors affecting potential to achieve goals and functional outcome are co-morbidities and previous level of function. Patient will benefit from skilled SLP services to address above impairments and improve overall function.  REHAB POTENTIAL: Good  PLAN:  SLP FREQUENCY: 2x/week  SLP DURATION: 7 weeks   PLANNED INTERVENTIONS: Cognitive reorganization, Internal/external aids, Oral motor exercises, Functional tasks, Multimodal communication approach, SLP instruction and feedback, and Compensatory strategies   SPEECH THERAPY DISCHARGE SUMMARY  Visits from Start of Care: 9  Current functional level related to goals / functional outcomes: Pt reports has determined successful memory strategies for her, prefers using written aids. Generates list of ways to use memory strategies and has implemented with success. Improved discourse cohesion evidenced during structured practice with feedback from SLP.    Remaining deficits: Expressive language, cognition    Education / Equipment: Cognitive strategies/compensations and functional application; structured language task; strategies for processing/discourse/word finding. HEP   Patient agrees to discharge. Patient goals were partially met. Patient is being discharged due to maximized rehab potential.   Maia Breslow, CCC-SLP 11/28/2022, 11:05 AM

## 2022-12-02 ENCOUNTER — Other Ambulatory Visit (HOSPITAL_COMMUNITY): Payer: Self-pay

## 2022-12-03 ENCOUNTER — Other Ambulatory Visit (HOSPITAL_COMMUNITY): Payer: Self-pay

## 2022-12-03 ENCOUNTER — Ambulatory Visit: Payer: 59 | Admitting: Clinical

## 2022-12-03 DIAGNOSIS — F840 Autistic disorder: Secondary | ICD-10-CM

## 2022-12-03 NOTE — Progress Notes (Signed)
Time: 11:00 am-11:55 am CPT Code: 16109U-04 Diagnosis: F84.0  Jill Alexander was seen remotely using secure video conferencing. She was in her home and the therapist was in her office at the time of the appointment. Client is aware of the risks of telehealth and consented to a virtual visit. Session focused on preparing a coping plan for the next three weeks. Jill Alexander actively engaged in creating the plan and agreed that she would be able to follow it. She is scheduled to be seen again in three weeks.   Coping Strategies   Reach out to a family member to ask for a vent session Re-read a comforting book Listen to a podcast Watch a funny TV show Do the Bop it Play a video game Do some art Write in your journal (try therapeutic journaling) Go on a walk Pet your dog  Intake Presenting Problem Jill Alexander presented seeking CBT to help her manage difficulties with emotion regulation and reframing negative thought patterns that contribute to anxiety and worry. Jill Alexander has a diagnosis of Autism Spectrum Disorder (ASD). Symptoms feeling nervous or on edge, difficulty controlling worry, worrying about a variety of things, difficulty relaxing, irritability, fears for the future, feelings of hopelessness, difficulty sleeping, feeling tired, appetite irregularities, poor self-image, passive suicidal ideation without plan or intent (client contracted for safety during initial session) History of Problem  Jill Alexander reported that she moved from [place] with her family in July of 2018. Since that time, she has been seen by a provider in the area for challenges with emotion regulation and anxiety related to her diagnosis of autism spectrum disorder. Jill Alexander lives with her parents. She has a 47 year-old sister who has struggled with similar issues, as well as a brother who is three years older than she is and lives in [place]. Jill Alexander described a long history of therapy and therapeutic programs in order to help her manage these issues,  including a partial hospitalization program in December of 2015.  Recent Trigger  Jill Alexander reported that she was seeking a change in pace with regard to her therapeutic treatment in order to ensure continued progress.  Marital and Family Information  Present family concerns/problems: Jill Alexander shared several accounts of disputes with her mother, including her mother becoming upset with her for sharing worries with her immediately after her mother has woken up, despite her mother's requests that she not do so.  Strengths/resources in the family/friends: Jill Alexander described overall supportive relationships with her parents.  Marital/sexual history patterns: N/A  Family of Origin  Problems in family of origin: None reported.  Family background / ethnic factors: none reported.  No needs/concerns related to ethnicity reported when asked: No  Education/Vocation  Interpersonal concerns/problems: Jill Alexander reported that she has difficulties with social interactions related to her ASD diagnosis, and often perseverates about perceived missteps in both the immediate and distant past.  Personal strengths: Jill Alexander presented as motivated to improve her situation, and with some insight as to what is effective for her.  Military/work problems/concerns: None noted.  Leisure Activities/Daily Functioning  impaired functioning  Legal Status  No Legal Problems  Medical/Nutritional Concerns  unspecified  Comments: Jill Alexander reported that she often perseverates on perceived medical problems, and described herself as a hypochondriac  Religion/Spirituality  Not reported  Other  General Behavior: cooperative  Attire: appropriate  Gait: normal  Motor Activity: normal  Stream of Thought - Productivity: spontaneous  Stream of thought - Progression: normal  Stream of thought - Language: normal  Emotional tone and reactions -  Affect: appropriate  Mental trend/Content of thoughts - Perception: normal  Mental trend/Content of thoughts  - Orientation: normal  Mental trend/Content of thoughts - Memory: normal  Mental trend/Content of thoughts - General knowledge: consistent with education  Insight: good  Judgment: good  Intelligence: average  Diagnostic Summary  Autism Spectrum Disorder (F84.0)   [facility] Counselor/Therapist Progress Note   Patient ID: Lia Shubin, MRN: [id number],     Individualized Treatment Plan Strengths: Enjoys reading, Seeking counseling, In training program through [employer], "I like humor and jokes"  Supports: Her dog, her bestfriend since childhood, her parents, her sister    Goal/Needs for Treatment:  In order of importance to patient 1) Emotional Regulation/Anxiety 2) Improve Positive self talk      Client Statement of Needs: "Find ways of being nicer to myself", cope w/ anxiety and worry    Treatment Level:Outpatient Counseling  Symptoms:Anxiety, Perseverative worry, Negative self talk  Client Treatment Preferences:weekly counseling,  Prefers to be called Jill Alexander    Healthcare consumer's goal for treatment:  Dr. Charlyne Mom will support the patient's ability to achieve the goals identified. Cognitive Behavioral Therapy, Supportive Counseling, Mindfulness and Relaxation Strategies and other evidenced-based practices will be used to promote progress towards healthy functioning.    Healthcare consumer will: Actively participate in therapy, working towards healthy functioning.     *Justification for Continuation/Discontinuation of Goal: R=Revised, O=Ongoing, A=Achieved, D=Discontinued   Goal 1) Jill Alexander will develop strategies to regulate her emotions and improve her positive self talk Likert rating baseline date 04/12/21: How Easy - 3; How Often - 50% Target Date Goal Was reviewed Status Code Progress towards goal/Likert rating  05/02/23   New Ongoing                      Jill Alexander participated in development of tx plan and provided verbal consent.     Chrissie Noa, PhD               Chrissie Noa, PhD               Chrissie Noa, PhD               Chrissie Noa, PhDTime: 11:00 am-11:55 am CPT Code: 44010U Diagnosis: F84.0  Jill Alexander was seen in person for therapy. She reported that she had overslept and had nearly not made it to her session on time, and as a result had not taken her medication that morning. Jill Alexander's processing appeared especially slow, and she especially struggled with word retrieval without session. She reported that she has started seeing a new provider for medication management, and had been placed on a low dose of stimulant medication. She expressed continued frustrations with work, and reflected upon an [place]. Therapist pointed out maladaptive cognitions and offered alternate perspectives. She is scheduled to be seen again in one week.  Intake Presenting Problem Jill Alexander presented seeking CBT to help her manage difficulties with emotion regulation and reframing negative thought patterns that contribute to anxiety and worry. Jill Alexander has a diagnosis of Autism Spectrum Disorder (ASD). Symptoms feeling nervous or on edge, difficulty controlling worry, worrying about a variety of things, difficulty relaxing, irritability, fears for the future, feelings of hopelessness, difficulty sleeping, feeling tired, appetite irregularities, poor self-image, passive suicidal ideation without plan or intent (client contracted for safety during initial session) History of Problem  Jill Alexander reported that she moved from [place] with her family in  July of 2018. Since that time, she has been seen by a provider in the area for challenges with emotion regulation and anxiety related to her diagnosis of autism spectrum disorder. Jill Alexander lives with her parents. She has a 40 year-old sister who has struggled with similar issues, as well as a brother who is three years older than she is and lives  in [place]. Jill Alexander described a long history of therapy and therapeutic programs in order to help her manage these issues, including a partial hospitalization program in December of 2015.  Recent Trigger  Jill Alexander reported that she was seeking a change in pace with regard to her therapeutic treatment in order to ensure continued progress.  Marital and Family Information  Present family concerns/problems: Jill Alexander shared several accounts of disputes with her mother, including her mother becoming upset with her for sharing worries with her immediately after her mother has woken up, despite her mother's requests that she not do so.  Strengths/resources in the family/friends: Jill Alexander described overall supportive relationships with her parents.  Marital/sexual history patterns: N/A  Family of Origin  Problems in family of origin: None reported.  Family background / ethnic factors: none reported.  No needs/concerns related to ethnicity reported when asked: No  Education/Vocation  Interpersonal concerns/problems: Jill Alexander reported that she has difficulties with social interactions related to her ASD diagnosis, and often perseverates about perceived missteps in both the immediate and distant past.  Personal strengths: Jill Alexander presented as motivated to improve her situation, and with some insight as to what is effective for her.  Military/work problems/concerns: None noted.  Leisure Activities/Daily Functioning  impaired functioning  Legal Status  No Legal Problems  Medical/Nutritional Concerns  unspecified  Comments: Jill Alexander reported that she often perseverates on perceived medical problems, and described herself as a hypochondriac  Religion/Spirituality  Not reported  Other  General Behavior: cooperative  Attire: appropriate  Gait: normal  Motor Activity: normal  Stream of Thought - Productivity: spontaneous  Stream of thought - Progression: normal  Stream of thought - Language: normal  Emotional tone and  reactions - Affect: appropriate  Mental trend/Content of thoughts - Perception: normal  Mental trend/Content of thoughts - Orientation: normal  Mental trend/Content of thoughts - Memory: normal  Mental trend/Content of thoughts - General knowledge: consistent with education  Insight: good  Judgment: good  Intelligence: average  Diagnostic Summary  Autism Spectrum Disorder (F84.0)   [facility] Counselor/Therapist Progress Note   Patient ID: Sibyle Mohammadi, MRN: [id number],     Individualized Treatment Plan Strengths: Enjoys reading, Seeking counseling, In training program through [employer], "I like humor and jokes"  Supports: Her dog, her bestfriend since childhood, her parents, her sister    Goal/Needs for Treatment:  In order of importance to patient 1) Emotional Regulation/Anxiety 2) Improve Positive self talk      Client Statement of Needs: "Find ways of being nicer to myself", cope w/ anxiety and worry    Treatment Level:Outpatient Counseling  Symptoms:Anxiety, Perseverative worry, Negative self talk  Client Treatment Preferences:weekly counseling,  Prefers to be called Jill Alexander    Healthcare consumer's goal for treatment:  Dr. Charlyne Mom will support the patient's ability to achieve the goals identified. Cognitive Behavioral Therapy, Supportive Counseling, Mindfulness and Relaxation Strategies and other evidenced-based practices will be used to promote progress towards healthy functioning.    Healthcare consumer will: Actively participate in therapy, working towards healthy functioning.     *Justification for Continuation/Discontinuation of Goal: R=Revised, O=Ongoing, A=Achieved, D=Discontinued  Goal 1) Jill Alexander will develop strategies to regulate her emotions and improve her positive self talk Likert rating baseline date 04/12/21: How Easy - 3; How Often - 50% Target Date Goal Was reviewed Status Code Progress towards goal/Likert rating  05/02/23   New  Ongoing                      Jill Alexander participated in development of tx plan and provided verbal consent.     Chrissie Noa, PhD               Chrissie Noa, PhD               Chrissie Noa, PhD

## 2022-12-10 ENCOUNTER — Ambulatory Visit: Payer: 59 | Admitting: Clinical

## 2022-12-10 ENCOUNTER — Other Ambulatory Visit (HOSPITAL_COMMUNITY): Payer: Self-pay

## 2022-12-10 DIAGNOSIS — F840 Autistic disorder: Secondary | ICD-10-CM | POA: Diagnosis not present

## 2022-12-10 DIAGNOSIS — F900 Attention-deficit hyperactivity disorder, predominantly inattentive type: Secondary | ICD-10-CM | POA: Diagnosis not present

## 2022-12-10 DIAGNOSIS — F411 Generalized anxiety disorder: Secondary | ICD-10-CM | POA: Diagnosis not present

## 2022-12-10 DIAGNOSIS — F331 Major depressive disorder, recurrent, moderate: Secondary | ICD-10-CM | POA: Diagnosis not present

## 2022-12-10 MED ORDER — LAMOTRIGINE 200 MG PO TABS
400.0000 mg | ORAL_TABLET | Freq: Every day | ORAL | 1 refills | Status: DC
  Filled 2022-12-10 – 2022-12-19 (×2): qty 180, 90d supply, fill #0
  Filled 2023-03-05: qty 180, 90d supply, fill #1

## 2022-12-10 MED ORDER — TOPIRAMATE 50 MG PO TABS
150.0000 mg | ORAL_TABLET | Freq: Every day | ORAL | 0 refills | Status: DC
  Filled 2022-12-10 – 2023-01-28 (×4): qty 270, 90d supply, fill #0

## 2022-12-10 MED ORDER — LORAZEPAM 1 MG PO TABS
1.0000 mg | ORAL_TABLET | Freq: Two times a day (BID) | ORAL | 1 refills | Status: DC | PRN
  Filled 2022-12-10: qty 60, 30d supply, fill #0

## 2022-12-10 MED ORDER — AUVELITY 45-105 MG PO TBCR
1.0000 | EXTENDED_RELEASE_TABLET | Freq: Two times a day (BID) | ORAL | 1 refills | Status: DC
  Filled 2022-12-10 – 2023-02-25 (×2): qty 180, 90d supply, fill #0
  Filled 2023-04-09 – 2023-08-05 (×2): qty 180, 90d supply, fill #1

## 2022-12-10 MED ORDER — LURASIDONE HCL 60 MG PO TABS
60.0000 mg | ORAL_TABLET | Freq: Every evening | ORAL | 1 refills | Status: DC
  Filled 2022-12-10: qty 30, 30d supply, fill #0
  Filled 2022-12-19 – 2023-01-06 (×2): qty 30, 30d supply, fill #1

## 2022-12-17 ENCOUNTER — Ambulatory Visit: Payer: 59 | Admitting: Clinical

## 2022-12-17 DIAGNOSIS — F840 Autistic disorder: Secondary | ICD-10-CM | POA: Diagnosis not present

## 2022-12-17 NOTE — Progress Notes (Signed)
Time: 03:00 pm-3:55 am CPT Code: 81191Y-78 Diagnosis: F84.0  Jill Alexander was seen remotely using secure video conferencing. She was in her home and the therapist was in her home at the time of the appointment. Client is aware of the risks of telehealth and consented to a virtual visit. She had reached out to the therapist via secure message to request an appointment to discuss issues related to her identity. Therapist provided an opportunity to process, offering psychoeducation on recent research related to sexual identity. She also reflected upon a challenging day at work, and therapist encouraged her to allow herself time to relax and decompress before writing down feedback she has gotten so that she can review it before her next shift. She is scheduled to be seen again in one week.    Intake Presenting Problem Jill Alexander presented seeking CBT to help her manage difficulties with emotion regulation and reframing negative thought patterns that contribute to anxiety and worry. Jill Alexander has a diagnosis of Autism Spectrum Disorder (ASD). Symptoms feeling nervous or on edge, difficulty controlling worry, worrying about a variety of things, difficulty relaxing, irritability, fears for the future, feelings of hopelessness, difficulty sleeping, feeling tired, appetite irregularities, poor self-image, passive suicidal ideation without plan or intent (client contracted for safety during initial session) History of Problem  Jill Alexander reported that she moved from [place] with her family in July of 2018. Since that time, she has been seen by a provider in the area for challenges with emotion regulation and anxiety related to her diagnosis of autism spectrum disorder. Jill Alexander lives with her parents. She has a 8 year-old sister who has struggled with similar issues, as well as a brother who is three years older than she is and lives in [place]. Jill Alexander described a long history of therapy and therapeutic programs in order to help her  manage these issues, including a partial hospitalization program in December of 2015.  Recent Trigger  Jill Alexander reported that she was seeking a change in pace with regard to her therapeutic treatment in order to ensure continued progress.  Marital and Family Information  Present family concerns/problems: Jill Alexander shared several accounts of disputes with her mother, including her mother becoming upset with her for sharing worries with her immediately after her mother has woken up, despite her mother's requests that she not do so.  Strengths/resources in the family/friends: Jill Alexander described overall supportive relationships with her parents.  Marital/sexual history patterns: N/A  Family of Origin  Problems in family of origin: None reported.  Family background / ethnic factors: none reported.  No needs/concerns related to ethnicity reported when asked: No  Education/Vocation  Interpersonal concerns/problems: Jill Alexander reported that she has difficulties with social interactions related to her ASD diagnosis, and often perseverates about perceived missteps in both the immediate and distant past.  Personal strengths: Jill Alexander presented as motivated to improve her situation, and with some insight as to what is effective for her.  Military/work problems/concerns: None noted.  Leisure Activities/Daily Functioning  impaired functioning  Legal Status  No Legal Problems  Medical/Nutritional Concerns  unspecified  Comments: Jill Alexander reported that she often perseverates on perceived medical problems, and described herself as a hypochondriac  Religion/Spirituality  Not reported  Other  General Behavior: cooperative  Attire: appropriate  Gait: normal  Motor Activity: normal  Stream of Thought - Productivity: spontaneous  Stream of thought - Progression: normal  Stream of thought - Language: normal  Emotional tone and reactions - Affect: appropriate  Mental trend/Content of thoughts - Perception:  normal  Mental  trend/Content of thoughts - Orientation: normal  Mental trend/Content of thoughts - Memory: normal  Mental trend/Content of thoughts - General knowledge: consistent with education  Insight: good  Judgment: good  Intelligence: average  Diagnostic Summary  Autism Spectrum Disorder (F84.0)   [facility] Counselor/Therapist Progress Note   Patient ID: Jill Alexander, MRN: [id number],     Individualized Treatment Plan Strengths: Enjoys reading, Seeking counseling, In training program through [employer], "I like humor and jokes"  Supports: Her dog, her bestfriend since childhood, her parents, her sister    Goal/Needs for Treatment:  In order of importance to patient 1) Emotional Regulation/Anxiety 2) Improve Positive self talk      Client Statement of Needs: "Find ways of being nicer to myself", cope w/ anxiety and worry    Treatment Level:Outpatient Counseling  Symptoms:Anxiety, Perseverative worry, Negative self talk  Client Treatment Preferences:weekly counseling,  Prefers to be called Jill Alexander    Healthcare consumer's goal for treatment:  Dr. Charlyne Mom will support the patient's ability to achieve the goals identified. Cognitive Behavioral Therapy, Supportive Counseling, Mindfulness and Relaxation Strategies and other evidenced-based practices will be used to promote progress towards healthy functioning.    Healthcare consumer will: Actively participate in therapy, working towards healthy functioning.     *Justification for Continuation/Discontinuation of Goal: R=Revised, O=Ongoing, A=Achieved, D=Discontinued   Goal 1) Jill Alexander will develop strategies to regulate her emotions and improve her positive self talk Likert rating baseline date 04/12/21: How Easy - 3; How Often - 50% Target Date Goal Was reviewed Status Code Progress towards goal/Likert rating  05/02/23   New Ongoing                      Jill Alexander participated in development of tx plan and provided  verbal consent.     Chrissie Noa, PhD               Chrissie Noa, PhD               Chrissie Noa, PhD               Chrissie Noa, PhD

## 2022-12-19 ENCOUNTER — Other Ambulatory Visit (HOSPITAL_COMMUNITY): Payer: Self-pay

## 2022-12-19 ENCOUNTER — Other Ambulatory Visit: Payer: Self-pay

## 2022-12-19 MED ORDER — MIRABEGRON ER 50 MG PO TB24
50.0000 mg | ORAL_TABLET | Freq: Every day | ORAL | 3 refills | Status: DC
  Filled 2022-12-19 – 2023-01-06 (×3): qty 30, 30d supply, fill #0
  Filled 2023-02-13: qty 30, 30d supply, fill #1
  Filled 2023-03-06 – 2023-03-18 (×2): qty 30, 30d supply, fill #2
  Filled 2023-04-09 – 2023-04-11 (×2): qty 30, 30d supply, fill #3

## 2022-12-23 ENCOUNTER — Other Ambulatory Visit (HOSPITAL_COMMUNITY): Payer: Self-pay

## 2022-12-23 ENCOUNTER — Other Ambulatory Visit: Payer: Self-pay

## 2022-12-24 ENCOUNTER — Ambulatory Visit (INDEPENDENT_AMBULATORY_CARE_PROVIDER_SITE_OTHER): Payer: 59 | Admitting: Clinical

## 2022-12-24 DIAGNOSIS — F840 Autistic disorder: Secondary | ICD-10-CM

## 2022-12-24 NOTE — Progress Notes (Signed)
Time: 11:00 am-11:55 am CPT Code: 28315V-76 Diagnosis: F84.0  Jill Alexander was seen in person for individual therapy. She reported several positive developments since the past week, including having received positive feedback at work, considering her future career options, and having met someone online with whom she is planning a date. She is scheduled to be seen again in one week, and requested to continue discussing her date and visions of her future career.   Intake Presenting Problem Jill Alexander presented seeking CBT to help her manage difficulties with emotion regulation and reframing negative thought patterns that contribute to anxiety and worry. Jill Alexander has a diagnosis of Autism Spectrum Disorder (ASD). Symptoms feeling nervous or on edge, difficulty controlling worry, worrying about a variety of things, difficulty relaxing, irritability, fears for the future, feelings of hopelessness, difficulty sleeping, feeling tired, appetite irregularities, poor self-image, passive suicidal ideation without plan or intent (client contracted for safety during initial session) History of Problem  Jill Alexander reported that she moved from [place] with her family in July of 2018. Since that time, she has been seen by a provider in the area for challenges with emotion regulation and anxiety related to her diagnosis of autism spectrum disorder. Jill Alexander lives with her parents. She has a 2 year-old sister who has struggled with similar issues, as well as a brother who is three years older than she is and lives in [place]. Jill Alexander described a long history of therapy and therapeutic programs in order to help her manage these issues, including a partial hospitalization program in December of 2015.  Recent Trigger  Jill Alexander reported that she was seeking a change in pace with regard to her therapeutic treatment in order to ensure continued progress.  Marital and Family Information  Present family concerns/problems: Jill Alexander shared several accounts  of disputes with her mother, including her mother becoming upset with her for sharing worries with her immediately after her mother has woken up, despite her mother's requests that she not do so.  Strengths/resources in the family/friends: Jill Alexander described overall supportive relationships with her parents.  Marital/sexual history patterns: N/A  Family of Origin  Problems in family of origin: None reported.  Family background / ethnic factors: none reported.  No needs/concerns related to ethnicity reported when asked: No  Education/Vocation  Interpersonal concerns/problems: Jill Alexander reported that she has difficulties with social interactions related to her ASD diagnosis, and often perseverates about perceived missteps in both the immediate and distant past.  Personal strengths: Jill Alexander presented as motivated to improve her situation, and with some insight as to what is effective for her.  Military/work problems/concerns: None noted.  Leisure Activities/Daily Functioning  impaired functioning  Legal Status  No Legal Problems  Medical/Nutritional Concerns  unspecified  Comments: Jill Alexander reported that she often perseverates on perceived medical problems, and described herself as a hypochondriac  Religion/Spirituality  Not reported  Other  General Behavior: cooperative  Attire: appropriate  Gait: normal  Motor Activity: normal  Stream of Thought - Productivity: spontaneous  Stream of thought - Progression: normal  Stream of thought - Language: normal  Emotional tone and reactions - Affect: appropriate  Mental trend/Content of thoughts - Perception: normal  Mental trend/Content of thoughts - Orientation: normal  Mental trend/Content of thoughts - Memory: normal  Mental trend/Content of thoughts - General knowledge: consistent with education  Insight: good  Judgment: good  Intelligence: average  Diagnostic Summary  Autism Spectrum Disorder (F84.0)   [facility]  Counselor/Therapist Progress Note   Patient ID: [name],  MRN: [id number],     Individualized Treatment Plan Strengths: Enjoys reading, Seeking counseling, In training program through [employer], "I like humor and jokes"  Supports: Her dog, her bestfriend since childhood, her parents, her sister    Goal/Needs for Treatment:  In order of importance to patient 1) Emotional Regulation/Anxiety 2) Improve Positive self talk      Client Statement of Needs: "Find ways of being nicer to myself", cope w/ anxiety and worry    Treatment Level:Outpatient Counseling  Symptoms:Anxiety, Perseverative worry, Negative self talk  Client Treatment Preferences:weekly counseling,  Prefers to be called Jill Alexander    Healthcare consumer's goal for treatment:  Dr. Charlyne Mom will support the patient's ability to achieve the goals identified. Cognitive Behavioral Therapy, Supportive Counseling, Mindfulness and Relaxation Strategies and other evidenced-based practices will be used to promote progress towards healthy functioning.    Healthcare consumer will: Actively participate in therapy, working towards healthy functioning.     *Justification for Continuation/Discontinuation of Goal: R=Revised, O=Ongoing, A=Achieved, D=Discontinued   Goal 1) Jill Alexander will develop strategies to regulate her emotions and improve her positive self talk Likert rating baseline date 04/12/21: How Easy - 3; How Often - 50% Target Date Goal Was reviewed Status Code Progress towards goal/Likert rating  05/02/23   New Ongoing                      Jill Alexander participated in development of tx plan and provided verbal consent.     Chrissie Noa, PhD               Chrissie Noa, PhD               Chrissie Noa, PhD               Chrissie Noa, PhD               Chrissie Noa, PhD

## 2022-12-31 ENCOUNTER — Ambulatory Visit (INDEPENDENT_AMBULATORY_CARE_PROVIDER_SITE_OTHER): Payer: 59 | Admitting: Clinical

## 2022-12-31 DIAGNOSIS — F840 Autistic disorder: Secondary | ICD-10-CM

## 2022-12-31 NOTE — Progress Notes (Signed)
Time: 11:00 am-11:55 am CPT Code: 32440N-02 Diagnosis: F84.0  Jill Alexander was seen in person for individual therapy. She reported an improvement in overall mood and remission in depressive symptoms, which she attributed to medication. Jill Alexander shared conflicted feelings about her depression lifting, and therapist queried whether it may relate to fears that expectations of her will increase as a result. She agreed that that may be. Therapist encouraged her to differentiate between feeling good and having to "have it all figured out." She had gone on a date, and reported that it had gone well but was not a Microbiologist. Her family had also adopted two kittens, which she is excited about. She is scheduled to be seen again in two weeks.  Intake Presenting Problem Jill Alexander presented seeking CBT to help her manage difficulties with emotion regulation and reframing negative thought patterns that contribute to anxiety and worry. Jill Alexander has a diagnosis of Autism Spectrum Disorder (ASD). Symptoms feeling nervous or on edge, difficulty controlling worry, worrying about a variety of things, difficulty relaxing, irritability, fears for the future, feelings of hopelessness, difficulty sleeping, feeling tired, appetite irregularities, poor self-image, passive suicidal ideation without plan or intent (client contracted for safety during initial session) History of Problem  Jill Alexander reported that she moved from [place] with her family in July of 2018. Since that time, she has been seen by a provider in the area for challenges with emotion regulation and anxiety related to her diagnosis of autism spectrum disorder. Jill Alexander lives with her parents. She has a 93 year-old sister who has struggled with similar issues, as well as a brother who is three years older than she is and lives in [place]. Jill Alexander described a long history of therapy and therapeutic programs in order to help her manage these issues, including a partial hospitalization program in  December of 2015.  Recent Trigger  Jill Alexander reported that she was seeking a change in pace with regard to her therapeutic treatment in order to ensure continued progress.  Marital and Family Information  Present family concerns/problems: Jill Alexander shared several accounts of disputes with her mother, including her mother becoming upset with her for sharing worries with her immediately after her mother has woken up, despite her mother's requests that she not do so.  Strengths/resources in the family/friends: Jill Alexander described overall supportive relationships with her parents.  Marital/sexual history patterns: N/A  Family of Origin  Problems in family of origin: None reported.  Family background / ethnic factors: none reported.  No needs/concerns related to ethnicity reported when asked: No  Education/Vocation  Interpersonal concerns/problems: Jill Alexander reported that she has difficulties with social interactions related to her ASD diagnosis, and often perseverates about perceived missteps in both the immediate and distant past.  Personal strengths: Jill Alexander presented as motivated to improve her situation, and with some insight as to what is effective for her.  Military/work problems/concerns: None noted.  Leisure Activities/Daily Functioning  impaired functioning  Legal Status  No Legal Problems  Medical/Nutritional Concerns  unspecified  Comments: Jill Alexander reported that she often perseverates on perceived medical problems, and described herself as a hypochondriac  Religion/Spirituality  Not reported  Other  General Behavior: cooperative  Attire: appropriate  Gait: normal  Motor Activity: normal  Stream of Thought - Productivity: spontaneous  Stream of thought - Progression: normal  Stream of thought - Language: normal  Emotional tone and reactions - Affect: appropriate  Mental trend/Content of thoughts - Perception: normal  Mental trend/Content of thoughts - Orientation: normal  Mental trend/Content of  thoughts - Memory: normal  Mental trend/Content of thoughts - General knowledge: consistent with education  Insight: good  Judgment: good  Intelligence: average  Diagnostic Summary  Autism Spectrum Disorder (F84.0)   [facility] Counselor/Therapist Progress Note   Patient ID: Alleane Gluck, MRN: [id number],     Individualized Treatment Plan Strengths: Enjoys reading, Seeking counseling, In training program through [employer], "I like humor and jokes"  Supports: Her dog, her bestfriend since childhood, her parents, her sister    Goal/Needs for Treatment:  In order of importance to patient 1) Emotional Regulation/Anxiety 2) Improve Positive self talk      Client Statement of Needs: "Find ways of being nicer to myself", cope w/ anxiety and worry    Treatment Level:Outpatient Counseling  Symptoms:Anxiety, Perseverative worry, Negative self talk  Client Treatment Preferences:weekly counseling,  Prefers to be called Jill Alexander    Healthcare consumer's goal for treatment:  Dr. Charlyne Mom will support the patient's ability to achieve the goals identified. Cognitive Behavioral Therapy, Supportive Counseling, Mindfulness and Relaxation Strategies and other evidenced-based practices will be used to promote progress towards healthy functioning.    Healthcare consumer will: Actively participate in therapy, working towards healthy functioning.     *Justification for Continuation/Discontinuation of Goal: R=Revised, O=Ongoing, A=Achieved, D=Discontinued   Goal 1) Jill Alexander will develop strategies to regulate her emotions and improve her positive self talk Likert rating baseline date 04/12/21: How Easy - 3; How Often - 50% Target Date Goal Was reviewed Status Code Progress towards goal/Likert rating  05/02/23   New Ongoing                      Jill Alexander participated in development of tx plan and provided verbal consent.     Chrissie Noa,  PhD               Chrissie Noa, PhD               Chrissie Noa, PhD               Chrissie Noa, PhD               Chrissie Noa, PhD               Chrissie Noa, PhD

## 2023-01-06 ENCOUNTER — Other Ambulatory Visit (HOSPITAL_COMMUNITY): Payer: Self-pay

## 2023-01-06 ENCOUNTER — Other Ambulatory Visit: Payer: Self-pay

## 2023-01-07 ENCOUNTER — Other Ambulatory Visit (HOSPITAL_COMMUNITY): Payer: Self-pay

## 2023-01-07 ENCOUNTER — Ambulatory Visit (INDEPENDENT_AMBULATORY_CARE_PROVIDER_SITE_OTHER): Payer: 59 | Admitting: Clinical

## 2023-01-07 ENCOUNTER — Encounter: Payer: Self-pay | Admitting: Obstetrics and Gynecology

## 2023-01-07 ENCOUNTER — Ambulatory Visit (INDEPENDENT_AMBULATORY_CARE_PROVIDER_SITE_OTHER): Payer: 59 | Admitting: Obstetrics and Gynecology

## 2023-01-07 VITALS — BP 110/72 | HR 76 | Resp 16 | Ht 62.0 in | Wt 133.0 lb

## 2023-01-07 DIAGNOSIS — Z01419 Encounter for gynecological examination (general) (routine) without abnormal findings: Secondary | ICD-10-CM | POA: Diagnosis not present

## 2023-01-07 DIAGNOSIS — F840 Autistic disorder: Secondary | ICD-10-CM | POA: Diagnosis not present

## 2023-01-07 DIAGNOSIS — R300 Dysuria: Secondary | ICD-10-CM

## 2023-01-07 MED ORDER — DROSPIRENONE-ETHINYL ESTRADIOL 3-0.02 MG PO TABS
1.0000 | ORAL_TABLET | Freq: Every day | ORAL | 11 refills | Status: DC
  Filled 2023-01-07: qty 84, 84d supply, fill #0
  Filled 2023-04-09: qty 84, 84d supply, fill #1
  Filled 2023-06-19: qty 84, 84d supply, fill #2

## 2023-01-07 NOTE — Progress Notes (Signed)
Time: 11:00 am-11:55 am CPT Code: 86578I-69 Diagnosis: F84.0  Jill Alexander was seen in person for individual therapy. She had reached out via secure message to request to discuss feelings of inadequacy, recent events at work, and symptoms of depression. Session focused on a mandatory meeting that had taken place at work, during which Jill Alexander and her coworkers had received group feedback and instruction. Jill Alexander shared feelings of inadequacy this had triggered. Therapist pointed out cognitive distortions and suggested alternate perspectives. She is scheduled to be seen again in one week.  Intake Presenting Problem Jill Alexander presented seeking CBT to help her manage difficulties with emotion regulation and reframing negative thought patterns that contribute to anxiety and worry. Jill Alexander has a diagnosis of Autism Spectrum Disorder (ASD). Symptoms feeling nervous or on edge, difficulty controlling worry, worrying about a variety of things, difficulty relaxing, irritability, fears for the future, feelings of hopelessness, difficulty sleeping, feeling tired, appetite irregularities, poor self-image, passive suicidal ideation without plan or intent (client contracted for safety during initial session) History of Problem  Jill Alexander reported that she moved from [place] with her family in July of 2018. Since that time, she has been seen by a provider in the area for challenges with emotion regulation and anxiety related to her diagnosis of autism spectrum disorder. Jill Alexander lives with her parents. She has a 5 year-old sister who has struggled with similar issues, as well as a brother who is three years older than she is and lives in [place]. Jill Alexander described a long history of therapy and therapeutic programs in order to help her manage these issues, including a partial hospitalization program in December of 2015.  Recent Trigger  Jill Alexander reported that she was seeking a change in pace with regard to her therapeutic treatment in order to  ensure continued progress.  Marital and Family Information  Present family concerns/problems: Jill Alexander shared several accounts of disputes with her mother, including her mother becoming upset with her for sharing worries with her immediately after her mother has woken up, despite her mother's requests that she not do so.  Strengths/resources in the family/friends: Jill Alexander described overall supportive relationships with her parents.  Marital/sexual history patterns: N/A  Family of Origin  Problems in family of origin: None reported.  Family background / ethnic factors: none reported.  No needs/concerns related to ethnicity reported when asked: No  Education/Vocation  Interpersonal concerns/problems: Jill Alexander reported that she has difficulties with social interactions related to her ASD diagnosis, and often perseverates about perceived missteps in both the immediate and distant past.  Personal strengths: Jill Alexander presented as motivated to improve her situation, and with some insight as to what is effective for her.  Military/work problems/concerns: None noted.  Leisure Activities/Daily Functioning  impaired functioning  Legal Status  No Legal Problems  Medical/Nutritional Concerns  unspecified  Comments: Jill Alexander reported that she often perseverates on perceived medical problems, and described herself as a hypochondriac  Religion/Spirituality  Not reported  Other  General Behavior: cooperative  Attire: appropriate  Gait: normal  Motor Activity: normal  Stream of Thought - Productivity: spontaneous  Stream of thought - Progression: normal  Stream of thought - Language: normal  Emotional tone and reactions - Affect: appropriate  Mental trend/Content of thoughts - Perception: normal  Mental trend/Content of thoughts - Orientation: normal  Mental trend/Content of thoughts - Memory: normal  Mental trend/Content of thoughts - General knowledge: consistent with education  Insight: good  Judgment: good   Intelligence: average  Diagnostic Summary  Autism Spectrum Disorder (  F84.0)   [facility] Counselor/Therapist Progress Note   Patient ID: Naiesha Harvick, MRN: [id number],     Individualized Treatment Plan Strengths: Enjoys reading, Seeking counseling, In training program through [employer], "I like humor and jokes"  Supports: Her dog, her bestfriend since childhood, her parents, her sister    Goal/Needs for Treatment:  In order of importance to patient 1) Emotional Regulation/Anxiety 2) Improve Positive self talk      Client Statement of Needs: "Find ways of being nicer to myself", cope w/ anxiety and worry    Treatment Level:Outpatient Counseling  Symptoms:Anxiety, Perseverative worry, Negative self talk  Client Treatment Preferences:weekly counseling,  Prefers to be called Jill Alexander    Healthcare consumer's goal for treatment:  Dr. Charlyne Mom will support the patient's ability to achieve the goals identified. Cognitive Behavioral Therapy, Supportive Counseling, Mindfulness and Relaxation Strategies and other evidenced-based practices will be used to promote progress towards healthy functioning.    Healthcare consumer will: Actively participate in therapy, working towards healthy functioning.     *Justification for Continuation/Discontinuation of Goal: R=Revised, O=Ongoing, A=Achieved, D=Discontinued   Goal 1) Jill Alexander will develop strategies to regulate her emotions and improve her positive self talk Likert rating baseline date 04/12/21: How Easy - 3; How Often - 50% Target Date Goal Was reviewed Status Code Progress towards goal/Likert rating  05/02/23   New Ongoing                      Jill Alexander participated in development of tx plan and provided verbal consent.     Chrissie Noa, PhD               Chrissie Noa, PhD               Chrissie Noa, PhD               Chrissie Noa,  PhD               Chrissie Noa, PhD               Chrissie Noa, PhD               Chrissie Noa, PhD

## 2023-01-08 ENCOUNTER — Telehealth (INDEPENDENT_AMBULATORY_CARE_PROVIDER_SITE_OTHER): Payer: 59 | Admitting: Neurology

## 2023-01-08 ENCOUNTER — Other Ambulatory Visit (HOSPITAL_COMMUNITY): Payer: Self-pay

## 2023-01-08 DIAGNOSIS — F840 Autistic disorder: Secondary | ICD-10-CM

## 2023-01-08 DIAGNOSIS — G43019 Migraine without aura, intractable, without status migrainosus: Secondary | ICD-10-CM | POA: Diagnosis not present

## 2023-01-08 MED ORDER — RIZATRIPTAN BENZOATE 10 MG PO TABS
10.0000 mg | ORAL_TABLET | ORAL | 11 refills | Status: DC | PRN
  Filled 2023-01-08 – 2023-02-02 (×2): qty 10, 30d supply, fill #0
  Filled 2023-02-24 – 2023-02-27 (×2): qty 10, 30d supply, fill #1
  Filled 2023-03-22: qty 10, 30d supply, fill #2
  Filled 2023-05-06 – 2023-05-20 (×2): qty 10, 30d supply, fill #3
  Filled 2023-07-07: qty 10, 30d supply, fill #4
  Filled 2023-08-07: qty 10, 30d supply, fill #5
  Filled 2023-10-25: qty 10, 30d supply, fill #6
  Filled 2023-11-21: qty 10, 30d supply, fill #7

## 2023-01-08 MED ORDER — AJOVY 225 MG/1.5ML ~~LOC~~ SOAJ
225.0000 mg | SUBCUTANEOUS | 11 refills | Status: AC
Start: 2023-01-08 — End: ?
  Filled 2023-01-08 – 2023-01-28 (×3): qty 1.5, 30d supply, fill #0
  Filled 2023-03-25: qty 1.5, 30d supply, fill #1
  Filled 2023-05-06 – 2023-05-20 (×2): qty 1.5, 30d supply, fill #2

## 2023-01-08 NOTE — Patient Instructions (Signed)
Start Ajovy once a month injection for migraine prevention.  Please give the injection 3 to 4 months to see full benefit.  Meds ordered this encounter  Medications   Fremanezumab-vfrm (AJOVY) 225 MG/1.5ML SOAJ    Sig: Inject 225 mg into the skin every 30 (thirty) days.    Dispense:  1.68 mL    Refill:  11   rizatriptan (MAXALT) 10 MG tablet    Sig: Take 1 tablet (10 mg total) by mouth as needed for migraine. May repeat in 2 hours if needed.    Dispense:  10 tablet    Refill:  11

## 2023-01-08 NOTE — Progress Notes (Signed)
Virtual Visit via Video Note  I connected with Jill Alexander on 01/08/23 at  2:45 PM EDT by a video enabled telemedicine application and verified that I am speaking with the correct person using two identifiers.  Location: Patient: at her home Provider: in the office    I discussed the limitations of evaluation and management by telemedicine and the availability of in person appointments. The patient expressed understanding and agreed to proceed.  History of Present Illness: Today January 08, 2023 SS: Had follow-up Dr. Vickey Huger in February 2024, negative sleep study.  Struggling with memory, losing her thoughts, Dr. Vickey Huger order EEG and MRI brain.  Referral to neuropsychological evaluation, [facility].  EEG showed diffuse slowing.  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  I last saw her in February 2023, we added Aimovig 140, continue Topamax, Lamictal. She didn't try the Aimovig monthly. Uses Maxalt as PRN, on Topamax 150 mg daily. Reports migraines, aura with spots on her vision. Reports 5 days a week are headaches, of those 2 are migraine. Describes migraine as all over, "it really really hurts", forehead, temples, prefers quiet environment. Doesn't know triggers, is working at [employer] as a Production assistant, radio. Valsalva does not worsening, not with coughing, laughing, sneezing. No seizures, remains on Lamictal. Saw speech therapy for stuttering speech.   06/21/21 SS: Jill Alexander has history of migraine headaches and seizures.  Topamax was increased at last visit, it helped for awhile. Still reporting headache. She has autism. No recent seizures. Now complains of daily headache in last several weeks. Stress level isn't very high. Wakes up with headaches. Will take ibuprofen or Tylenol, is careful not to overtake, only takes 2 days a week. 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 Has been drinking more coffee.    HISTORY  01/16/2021 Dr. Anne Hahn: Jill Alexander is a 27 year old left-handed white female with a history of intractable migraine headaches.  The patient has mild autism and a history of seizures.  She is on Topamax and lamotrigine currently.  She has not had any recent seizure events.  Her headaches over the last several weeks have become more frequent, she is having almost daily headaches.  She denies that she is under any increased stress.  She oftentimes will wake up with a headache.  She indicates that she drinks 2 to 3 cups of coffee daily.  She will take Maxalt if needed.  She denies any nausea or vomiting with the headache but does have some photophobia and phonophobia.  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  She thinks that she sleeps well but she does have ongoing problems with fatigue during the day, this is a chronic issue for her.  She returns to the office today for an evaluation.  She also remains on low-dose nortriptyline.   Observations/Objective: Via video visit, is alert and oriented, speech is slightly slow, but does not appear aphasic or dysarthric.  Follows commands.  Moves about freely.  Assessment and Plan: 1.  Chronic migraine headache 2.  History of seizures well-controlled 3.  Autism  -Start Ajovy 225 mg monthly injection for migraine prevention -Continue Maxalt 10 mg as needed for acute headache -Mention psychiatry had concern for side effect for Topamax, will message me via MyChart those details.  Dr. Anne Hahn had started Topamax in the past for  migraines, we can try to wean off in the future.  Remains on Lamictal for well-controlled seizures.  Follow Up Instructions: 6 months with Dr. Vickey Huger, still concern with spells of stuttering, has undergone work up: EEG no seizures, did show diffuse slowing.  MRI of the brain showed incidental type I  Arnold-Chiari malformation.  Negative sleep study.   I discussed the assessment and treatment plan with the patient. The patient was provided an opportunity to ask questions and all were answered. The patient agreed with the plan and demonstrated an understanding of the instructions.   The patient was advised to call back or seek an in-person evaluation if the symptoms worsen or if the condition fails to improve as anticipated.  Otila Kluver, DNP  [facility] [address] [place], [place] [address] [phone]

## 2023-01-09 ENCOUNTER — Other Ambulatory Visit (HOSPITAL_COMMUNITY): Payer: Self-pay

## 2023-01-09 LAB — URINE CULTURE

## 2023-01-11 DIAGNOSIS — R300 Dysuria: Secondary | ICD-10-CM | POA: Insufficient documentation

## 2023-01-11 NOTE — Progress Notes (Signed)
GYNECOLOGY ANNUAL PREVENTATIVE CARE ENCOUNTER NOTE  History:     Jill Alexander is a 27 y.o. G0P0000 female here for a routine annual gynecologic exam.  Current complaints: none.   Denies abnormal vaginal bleeding, discharge, pelvic pain, problems with intercourse or other gynecologic concerns.  Having occasional dysuria. Does not occur everyday. No hematuria.    Gynecologic History No LMP recorded. (Menstrual status: Oral contraceptives). Contraception: OCP (estrogen/progesterone) Last Pap: 2021. 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     atomoxetine (STRATTERA) 25 MG capsule Take 1 capsule (25 mg total) by mouth daily. 30 capsule 1   atomoxetine (STRATTERA) 40 MG capsule Take 1 capsule (40 mg total) by mouth daily. 30 capsule 1   Dextromethorphan-buPROPion ER (AUVELITY) 45-105 MG TBCR Take 1 tablet by mouth 2 (two) times daily. 60 tablet 2   Dextromethorphan-buPROPion ER (AUVELITY) 45-105 MG TBCR Take 1 tablet by mouth 2 (two) times daily. 180 tablet 1   Docusate Sodium (COLACE PO) Take by mouth.     Famotidine (PEPCID PO) Take by mouth.     Ibuprofen (MOTRIN PO) Take 200 mg by mouth as needed.     lamoTRIgine (LAMICTAL) 200 MG tablet Take two tablets bu mouth daily 180 tablet 1   LORazepam (ATIVAN) 1 MG tablet Take  1 tablet (1 mg total) by mouth 2 (two) times daily as needed for anxiety. 60 tablet 1   LORazepam (ATIVAN) 1 MG tablet Take 1 tablet (1 mg total) by mouth 2 (two) times daily as needed for severe anxiety 60 tablet 1   lurasidone (LATUDA) 40 MG TABS tablet Take 1 tablet (40 mg total) by mouth daily with breakfast. 90 tablet 0   Lurasidone HCl 60 MG TABS Take 1 tablet (60 mg total) by mouth every evening with food 30 tablet 1   melatonin 1 MG TABS tablet Take 1 tablet (1 mg total) by mouth at bedtime as needed (insomnia). 30 tablet 0   mirabegron ER (MYRBETRIQ) 50 MG TB24 tablet Take 1 tablet (50 mg total) by mouth daily. 30 tablet 3   Multiple Vitamins-Minerals (MULTIVITAMIN PO) Take 1 tablet by mouth daily.     omeprazole (PRILOSEC) 20 MG capsule Take 1 capsule (20 mg total) by mouth daily. 30 capsule 5   omeprazole (PRILOSEC) 20 MG capsule Take 1 capsule (20 mg total) by mouth daily 30 minutes before morning meal 90 capsule 0   polyethylene glycol (MIRALAX) packet Take 17 g by mouth daily as needed. 14 each 0   topiramate (TOPAMAX) 50 MG tablet Take 3 tablets (150 mg total) by mouth daily. 270 tablet 0   viloxazine ER (  QELBREE) 200 MG 24 hr capsule Take 1 capsule (200 mg total) by mouth in the morning. 30 capsule 1   [DISCONTINUED] Norethin Ace-Eth Estrad-FE (TAYTULLA) 1-20 MG-MCG(24) CAPS TAKE 1 CAPSULE BY MOUTH DAILY. 28 capsule 11   No current facility-administered medications on file prior to visit.    Allergies  Allergen Reactions   Gluten Meal     Social History:  reports that she has never smoked. She has never used smokeless tobacco. She reports current alcohol use. She reports that she does not use drugs.  Family History  Problem Relation Age of Onset   Depression Father    Depression Sister    Anxiety disorder Sister    Diabetes Maternal Grandfather    Cancer Maternal Grandfather    Breast cancer Paternal Grandmother    Uterine cancer Paternal Grandmother    Irritable  bowel syndrome Paternal Grandmother    Colon cancer Neg Hx    Esophageal cancer Neg Hx    Stomach cancer Neg Hx    Rectal cancer Neg Hx     The following portions of the patient's history were reviewed and updated as appropriate: allergies, current medications, past family history, past medical history, past social history, past surgical history and problem list.  Review of Systems Pertinent items noted in HPI and remainder of comprehensive ROS otherwise negative.  Physical Exam:  BP 110/72   Pulse 76   Resp 16   Ht 5\' 2"  (1.575 m)   Wt 133 lb (60.3 kg)   BMI 24.33 kg/m  CONSTITUTIONAL: Well-developed, well-nourished female in no acute distress.  HENT:  Normocephalic, atraumatic, External right and left ear normal.  EYES: Conjunctivae and EOM are normal. Pupils are equal, round, and reactive to light. No scleral icterus.  NECK: Normal range of motion, supple, no masses.  Normal thyroid.  SKIN: Skin is warm and dry. No rash noted. Not diaphoretic. No erythema. No pallor. MUSCULOSKELETAL: Normal range of motion. No tenderness.  No cyanosis, clubbing, or edema. NEUROLOGIC: Alert and oriented to person, place, and time. Normal reflexes, muscle tone coordination.  PSYCHIATRIC: Normal mood and affect. Normal behavior. Normal judgment and thought content. CARDIOVASCULAR: Normal heart rate noted, regular rhythm RESPIRATORY: Clear to auscultation bilaterally. Effort and breath sounds normal, no problems with respiration noted. BREASTS: Symmetric in size. No masses, tenderness, skin changes, nipple drainage, or lymphadenopathy bilaterally. Performed in the presence of a chaperone. ABDOMEN: Soft, no distention noted.  No tenderness, rebound or guarding.  PELVIC: Deferred    Assessment and Plan:  1. Dysuria  - Urine Culture  2. Women's annual routine gynecological examination  Pap in 2021, Jill Alexander has a very difficult time with speculum/ pelvic exams.  She is agreeable to have pap's every  4-5 years with is inline with ASCCP guidelines.  Refill on BC, 1 year supply   Normal breast examination today, she was advised to perform periodic self breast examinations.  Routine preventative health maintenance measures emphasized. Please refer to After Visit Summary for other counseling recommendations.   Alyric Parkin, Harolyn Rutherford, NP [facility] [employer], [facility]

## 2023-01-14 ENCOUNTER — Ambulatory Visit (INDEPENDENT_AMBULATORY_CARE_PROVIDER_SITE_OTHER): Payer: 59 | Admitting: Clinical

## 2023-01-14 ENCOUNTER — Other Ambulatory Visit (HOSPITAL_COMMUNITY): Payer: Self-pay

## 2023-01-14 DIAGNOSIS — F840 Autistic disorder: Secondary | ICD-10-CM

## 2023-01-14 NOTE — Progress Notes (Signed)
Time: 11:00 am-11:55 am CPT Code: 55732K Diagnosis: F84.0  Jill Alexander was seen in person for individual therapy. Her mother had reached out via secure message to share concerns that Jill Alexander has been increasingly stressed about eating, and to query whether Jill Alexander may benefit from a referral to an eating disorders specialist. Therapist processed this with her. Jill Alexander shared concerns about ensuring that she eats the "right" things, and does not eat when she is not hungry. Therapist pointed out black or white thinking about food, and suggested she contact [facility] to explore more targeted support for eating related issues. She is scheduled to be seen again in one week.  Presenting Problem Jill Alexander presented seeking CBT to help her manage difficulties with emotion regulation and reframing negative thought patterns that contribute to anxiety and worry. Jill Alexander has a diagnosis of Autism Spectrum Disorder (ASD). Symptoms feeling nervous or on edge, difficulty controlling worry, worrying about a variety of things, difficulty relaxing, irritability, fears for the future, feelings of hopelessness, difficulty sleeping, feeling tired, appetite irregularities, poor self-image, passive suicidal ideation without plan or intent (client contracted for safety during initial session) History of Problem  Jill Alexander reported that she moved from [place] with her family in July of 2018. Since that time, she has been seen by a provider in the area for challenges with emotion regulation and anxiety related to her diagnosis of autism spectrum disorder. Jill Alexander lives with her parents. She has a 29 year-old sister who has struggled with similar issues, as well as a brother who is 29 years older than she is and lives in [place]. Jill Alexander described a long history of therapy and therapeutic programs in order to help her manage these issues, including a partial hospitalization program in December of 29.  Recent Trigger  Jill Alexander reported that  she was seeking a change in pace with regard to her therapeutic treatment in order to ensure continued progress.  Marital and Family Information  Present family concerns/problems: Jill Alexander shared several accounts of disputes with her mother, including her mother becoming upset with her for sharing worries with her immediately after her mother has woken up, despite her mother's requests that she not do so.  Strengths/resources in the family/friends: Jill Alexander described overall supportive relationships with her parents.  Marital/sexual history patterns: N/A  Family of Origin  Problems in family of origin: None reported.  Family background / ethnic factors: none reported.  No needs/concerns related to ethnicity reported when asked: No  Education/Vocation  Interpersonal concerns/problems: Jill Alexander reported that she has difficulties with social interactions related to her ASD diagnosis, and often perseverates about perceived missteps in both the immediate and distant past.  Personal strengths: Jill Alexander presented as motivated to improve her situation, and with some insight as to what is effective for her.  Military/work problems/concerns: None noted.  Leisure Activities/Daily Functioning  impaired functioning  Legal Status  No Legal Problems  Medical/Nutritional Concerns  unspecified  Comments: Jill Alexander reported that she often perseverates on perceived medical problems, and described herself as a hypochondriac  Religion/Spirituality  Not reported  Other  General Behavior: cooperative  Attire: appropriate  Gait: normal  Motor Activity: normal  Stream of Thought - Productivity: spontaneous  Stream of thought - Progression: normal  Stream of thought - Language: normal  Emotional tone and reactions - Affect: appropriate  Mental trend/Content of thoughts - Perception: normal  Mental trend/Content of thoughts - Orientation: normal  Mental trend/Content of thoughts - Memory: normal  Mental trend/Content of  thoughts - General  knowledge: consistent with education  Insight: good  Judgment: good  Intelligence: average  Diagnostic Summary  Autism Spectrum Disorder (F84.0)   [facility] Counselor/Therapist Progress Note   Patient ID: Jill Alexander, MRN: [id number],     Individualized Treatment Plan Strengths: Enjoys reading, Seeking counseling, In training program through [employer], "I like humor and jokes"  Supports: Her dog, her bestfriend since childhood, her parents, her sister    Goal/Needs for Treatment:  In order of importance to patient 1) Emotional Regulation/Anxiety 2) Improve Positive self talk      Client Statement of Needs: "Find ways of being nicer to myself", cope w/ anxiety and worry    Treatment Level:Outpatient Counseling  Symptoms:Anxiety, Perseverative worry, Negative self talk  Client Treatment Preferences:weekly counseling,  Prefers to be called Jill Alexander    Healthcare consumer's goal for treatment:  Dr. Charlyne Mom will support the patient's ability to achieve the goals identified. Cognitive Behavioral Therapy, Supportive Counseling, Mindfulness and Relaxation Strategies and other evidenced-based practices will be used to promote progress towards healthy functioning.    Healthcare consumer will: Actively participate in therapy, working towards healthy functioning.     *Justification for Continuation/Discontinuation of Goal: R=Revised, O=Ongoing, A=Achieved, D=Discontinued   Goal 1) Jill Alexander will develop strategies to regulate her emotions and improve her positive self talk Likert rating baseline date 04/12/21: How Easy - 3; How Often - 50% Target Date Goal Was reviewed Status Code Progress towards goal/Likert rating  05/02/23   New Ongoing                      Jill Alexander participated in development of tx plan and provided verbal consent.    Chrissie Noa, PhD               Chrissie Noa, PhD

## 2023-01-16 ENCOUNTER — Other Ambulatory Visit (HOSPITAL_COMMUNITY): Payer: Self-pay

## 2023-01-16 MED ORDER — AUVELITY 45-105 MG PO TBCR
1.0000 | EXTENDED_RELEASE_TABLET | Freq: Two times a day (BID) | ORAL | 2 refills | Status: DC
  Filled 2023-01-16: qty 60, 30d supply, fill #0

## 2023-01-17 ENCOUNTER — Other Ambulatory Visit (HOSPITAL_COMMUNITY): Payer: Self-pay

## 2023-01-20 ENCOUNTER — Other Ambulatory Visit (HOSPITAL_COMMUNITY): Payer: Self-pay

## 2023-01-20 DIAGNOSIS — Z01118 Encounter for examination of ears and hearing with other abnormal findings: Secondary | ICD-10-CM | POA: Diagnosis not present

## 2023-01-20 DIAGNOSIS — H9193 Unspecified hearing loss, bilateral: Secondary | ICD-10-CM | POA: Diagnosis not present

## 2023-01-21 ENCOUNTER — Ambulatory Visit (INDEPENDENT_AMBULATORY_CARE_PROVIDER_SITE_OTHER): Payer: 59 | Admitting: Clinical

## 2023-01-21 ENCOUNTER — Other Ambulatory Visit (HOSPITAL_COMMUNITY): Payer: Self-pay

## 2023-01-21 DIAGNOSIS — F840 Autistic disorder: Secondary | ICD-10-CM

## 2023-01-21 NOTE — Progress Notes (Signed)
Time: 11:00 am-11:55 am CPT Code: 16109U Diagnosis: F84.0  Jill Alexander was seen in person for individual therapy. Jill Alexander reflected further upon thoughts she has had about her body and sexuality. Therapist offered psychoeducation on sexuality and encouraged Jill Alexander to allow herself to explore. She is scheduled to be seen again in one week.  Presenting Problem Jill Alexander presented seeking CBT to help her manage difficulties with emotion regulation and reframing negative thought patterns that contribute to anxiety and worry. Jill Alexander has a diagnosis of Autism Spectrum Disorder (ASD). Symptoms feeling nervous or on edge, difficulty controlling worry, worrying about a variety of things, difficulty relaxing, irritability, fears for the future, feelings of hopelessness, difficulty sleeping, feeling tired, appetite irregularities, poor self-image, passive suicidal ideation without plan or intent (client contracted for safety during initial session) History of Problem  Jill Alexander reported that she moved from [place] with her family in July of 2018. Since that time, she has been seen by a provider in the area for challenges with emotion regulation and anxiety related to her diagnosis of autism spectrum disorder. Jill Alexander lives with her parents. She has a 55 year-old sister who has struggled with similar issues, as well as a brother who is three years older than she is and lives in [place]. Jill Alexander described a long history of therapy and therapeutic programs in order to help her manage these issues, including a partial hospitalization program in December of 2015.  Recent Trigger  Jill Alexander reported that she was seeking a change in pace with regard to her therapeutic treatment in order to ensure continued progress.  Marital and Family Information  Present family concerns/problems: Jill Alexander shared several accounts of disputes with her mother, including her mother becoming upset with her for sharing worries with her immediately after her mother  has woken up, despite her mother's requests that she not do so.  Strengths/resources in the family/friends: Jill Alexander described overall supportive relationships with her parents.  Marital/sexual history patterns: N/A  Family of Origin  Problems in family of origin: None reported.  Family background / ethnic factors: none reported.  No needs/concerns related to ethnicity reported when asked: No  Education/Vocation  Interpersonal concerns/problems: Jill Alexander reported that she has difficulties with social interactions related to her ASD diagnosis, and often perseverates about perceived missteps in both the immediate and distant past.  Personal strengths: Jill Alexander presented as motivated to improve her situation, and with some insight as to what is effective for her.  Military/work problems/concerns: None noted.  Leisure Activities/Daily Functioning  impaired functioning  Legal Status  No Legal Problems  Medical/Nutritional Concerns  unspecified  Comments: Jill Alexander reported that she often perseverates on perceived medical problems, and described herself as a hypochondriac  Religion/Spirituality  Not reported  Other  General Behavior: cooperative  Attire: appropriate  Gait: normal  Motor Activity: normal  Stream of Thought - Productivity: spontaneous  Stream of thought - Progression: normal  Stream of thought - Language: normal  Emotional tone and reactions - Affect: appropriate  Mental trend/Content of thoughts - Perception: normal  Mental trend/Content of thoughts - Orientation: normal  Mental trend/Content of thoughts - Memory: normal  Mental trend/Content of thoughts - General knowledge: consistent with education  Insight: good  Judgment: good  Intelligence: average  Diagnostic Summary  Autism Spectrum Disorder (F84.0)   [facility] Counselor/Therapist Progress Note   Patient ID: Jill Alexander, MRN: [id number],     Individualized Treatment Plan Strengths: Enjoys  reading, Seeking counseling, In training program through [employer], "I like humor  and jokes"  Supports: Her dog, her bestfriend since childhood, her parents, her sister    Goal/Needs for Treatment:  In order of importance to patient 1) Emotional Regulation/Anxiety 2) Improve Positive self talk      Client Statement of Needs: "Find ways of being nicer to myself", cope w/ anxiety and worry    Treatment Level:Outpatient Counseling  Symptoms:Anxiety, Perseverative worry, Negative self talk  Client Treatment Preferences:weekly counseling,  Prefers to be called Jill Alexander    Healthcare consumer's goal for treatment:  Dr. Charlyne Mom will support the patient's ability to achieve the goals identified. Cognitive Behavioral Therapy, Supportive Counseling, Mindfulness and Relaxation Strategies and other evidenced-based practices will be used to promote progress towards healthy functioning.    Healthcare consumer will: Actively participate in therapy, working towards healthy functioning.     *Justification for Continuation/Discontinuation of Goal: R=Revised, O=Ongoing, A=Achieved, D=Discontinued   Goal 1) Jill Alexander will develop strategies to regulate her emotions and improve her positive self talk Likert rating baseline date 04/12/21: How Easy - 3; How Often - 50% Target Date Goal Was reviewed Status Code Progress towards goal/Likert rating  05/02/23   New Ongoing                      Jill Alexander participated in development of tx plan and provided verbal consent.      Chrissie Noa, PhD               Chrissie Noa, PhD

## 2023-01-23 ENCOUNTER — Other Ambulatory Visit (HOSPITAL_COMMUNITY): Payer: Self-pay

## 2023-01-23 DIAGNOSIS — F331 Major depressive disorder, recurrent, moderate: Secondary | ICD-10-CM | POA: Diagnosis not present

## 2023-01-23 DIAGNOSIS — F840 Autistic disorder: Secondary | ICD-10-CM | POA: Diagnosis not present

## 2023-01-23 DIAGNOSIS — F411 Generalized anxiety disorder: Secondary | ICD-10-CM | POA: Diagnosis not present

## 2023-01-23 DIAGNOSIS — F900 Attention-deficit hyperactivity disorder, predominantly inattentive type: Secondary | ICD-10-CM | POA: Diagnosis not present

## 2023-01-23 MED ORDER — AUVELITY 45-105 MG PO TBCR
1.0000 | EXTENDED_RELEASE_TABLET | Freq: Two times a day (BID) | ORAL | 2 refills | Status: DC
  Filled 2023-01-23 – 2023-03-06 (×2): qty 60, 30d supply, fill #0

## 2023-01-24 ENCOUNTER — Telehealth: Payer: Self-pay | Admitting: Neurology

## 2023-01-24 NOTE — Telephone Encounter (Signed)
Pt called stating that she is needing a PA for her Ajovy. Please advise. Please call pt when approval comes through and it is called to her pharmacy.

## 2023-01-27 ENCOUNTER — Other Ambulatory Visit (HOSPITAL_COMMUNITY): Payer: Self-pay

## 2023-01-27 ENCOUNTER — Telehealth: Payer: Self-pay

## 2023-01-27 DIAGNOSIS — G43019 Migraine without aura, intractable, without status migrainosus: Secondary | ICD-10-CM

## 2023-01-27 NOTE — Telephone Encounter (Signed)
I will order urine pregnancy. Need negative test before CGRP will be approved before insurance. Thanks  Orders Placed This Encounter  Procedures   Pregnancy, urine

## 2023-01-27 NOTE — Telephone Encounter (Signed)
PA request has been Submitted. New Encounter created for follow up. For additional info see Pharmacy Prior Auth telephone encounter from 01/27/2023.

## 2023-01-27 NOTE — Telephone Encounter (Signed)
Pharmacy Patient Advocate Encounter   Received notification from Physician's Office that prior authorization for AJOVY (fremanezumab-vfrm) injection 225MG /1.5ML auto-injectors is required/requested.   Insurance verification completed.   The patient is insured through [facility] .   Per test claim: PA required; PA submitted to [employer] via CoverMyMeds Key/confirmation #/EOC [id number] Status is pending

## 2023-01-27 NOTE — Telephone Encounter (Signed)
Pharmacy Patient Advocate Encounter  Received notification from [facility] that Prior Authorization for AJOVY (fremanezumab-vfrm) injection 225MG /1.5ML auto-injectors has been APPROVED from 01/27/2023 to 07/27/2023. Ran test claim, Copay is $24.98. This test claim was processed through [facility]- copay amounts may vary at other pharmacies due to pharmacy/plan contracts, or as the patient moves through the different stages of their insurance plan.   PA #/Case ID/Reference #: PA Case ID #: [id number]  Key: Phineas Semen  This is the Primary Payor with insurance. Still waiting to hear from Medicaid.

## 2023-01-27 NOTE — Telephone Encounter (Signed)
   Medicaid is asking for the above-please advise.

## 2023-01-28 ENCOUNTER — Other Ambulatory Visit (HOSPITAL_COMMUNITY): Payer: Self-pay | Admitting: Psychiatry

## 2023-01-28 ENCOUNTER — Ambulatory Visit (INDEPENDENT_AMBULATORY_CARE_PROVIDER_SITE_OTHER): Payer: 59 | Admitting: Clinical

## 2023-01-28 DIAGNOSIS — F840 Autistic disorder: Secondary | ICD-10-CM

## 2023-01-28 DIAGNOSIS — F332 Major depressive disorder, recurrent severe without psychotic features: Secondary | ICD-10-CM

## 2023-01-28 DIAGNOSIS — F411 Generalized anxiety disorder: Secondary | ICD-10-CM

## 2023-01-28 DIAGNOSIS — F410 Panic disorder [episodic paroxysmal anxiety] without agoraphobia: Secondary | ICD-10-CM

## 2023-01-28 NOTE — Progress Notes (Signed)
Time: 11:00 am-11:55 am CPT Code: [id number] Diagnosis: F84.0  [name] was seen in person for individual therapy. [name] shared that symptoms of depression have worsened. Suicidal ideation and intent were denied. Therapist suggested revisiting her coping strategies in the coming week, in order to discuss during her next session what works, what doesn't, and whether new coping strategies might be helpful. She is scheduled to be seen again in one week.  Presenting Problem [name] presented seeking CBT to help her manage difficulties with emotion regulation and reframing negative thought patterns that contribute to anxiety and worry. [name] has a diagnosis of Autism Spectrum Disorder (ASD). Symptoms feeling nervous or on edge, difficulty controlling worry, worrying about a variety of things, difficulty relaxing, irritability, fears for the future, feelings of hopelessness, difficulty sleeping, feeling tired, appetite irregularities, poor self-image, passive suicidal ideation without plan or intent (client contracted for safety during initial session) History of Problem  [name] reported that she moved from [place] with her family in July of 2018. Since that time, she has been seen by a provider in the area for challenges with emotion regulation and anxiety related to her diagnosis of autism spectrum disorder. [name] lives with her parents. She has a 60 year-old sister who has struggled with similar issues, as well as a brother who is three years older than she is and lives in [place]. [name] described a long history of therapy and therapeutic programs in order to help her manage these issues, including a partial hospitalization program in December of 2015.  Recent Trigger  [name] reported that she was seeking a change in pace with regard to her therapeutic treatment in order to ensure continued progress.  Marital and Family Information  Present family concerns/problems: [name] shared several accounts of disputes with  her mother, including her mother becoming upset with her for sharing worries with her immediately after her mother has woken up, despite her mother's requests that she not do so.  Strengths/resources in the family/friends: [name] described overall supportive relationships with her parents.  Marital/sexual history patterns: N/A  Family of Origin  Problems in family of origin: None reported.  Family background / ethnic factors: none reported.  No needs/concerns related to ethnicity reported when asked: No  Education/Vocation  Interpersonal concerns/problems: [name] reported that she has difficulties with social interactions related to her ASD diagnosis, and often perseverates about perceived missteps in both the immediate and distant past.  Personal strengths: [name] presented as motivated to improve her situation, and with some insight as to what is effective for her.  Military/work problems/concerns: None noted.  Leisure Activities/Daily Functioning  impaired functioning  Legal Status  No Legal Problems  Medical/Nutritional Concerns  unspecified  Comments: [name] reported that she often perseverates on perceived medical problems, and described herself as a hypochondriac  Religion/Spirituality  Not reported  Other  General Behavior: cooperative  Attire: appropriate  Gait: normal  Motor Activity: normal  Stream of Thought - Productivity: spontaneous  Stream of thought - Progression: normal  Stream of thought - Language: normal  Emotional tone and reactions - Affect: appropriate  Mental trend/Content of thoughts - Perception: normal  Mental trend/Content of thoughts - Orientation: normal  Mental trend/Content of thoughts - Memory: normal  Mental trend/Content of thoughts - General knowledge: consistent with education  Insight: good  Judgment: good  Intelligence: average  Diagnostic Summary  Autism Spectrum Disorder (F84.0)   [facility] Counselor/Therapist Progress  Note   Patient ID: Kaelen Brennan, MRN: [id number],  Individualized Treatment Plan Strengths: Enjoys reading, Seeking counseling, In training program through [employer], "I like humor and jokes"  Supports: Her dog, her bestfriend since childhood, her parents, her sister    Goal/Needs for Treatment:  In order of importance to patient 1) Emotional Regulation/Anxiety 2) Improve Positive self talk      Client Statement of Needs: "Find ways of being nicer to myself", cope w/ anxiety and worry    Treatment Level:Outpatient Counseling  Symptoms:Anxiety, Perseverative worry, Negative self talk  Client Treatment Preferences:weekly counseling,  Prefers to be called [name]    Healthcare consumer's goal for treatment:  Dr. Charlyne Mom will support the patient's ability to achieve the goals identified. Cognitive Behavioral Therapy, Supportive Counseling, Mindfulness and Relaxation Strategies and other evidenced-based practices will be used to promote progress towards healthy functioning.    Healthcare consumer will: Actively participate in therapy, working towards healthy functioning.     *Justification for Continuation/Discontinuation of Goal: R=Revised, O=Ongoing, A=Achieved, D=Discontinued   Goal 1) [name] will develop strategies to regulate her emotions and improve her positive self talk Likert rating baseline date 04/12/21: How Easy - 3; How Often - 50% Target Date Goal Was reviewed Status Code Progress towards goal/Likert rating  05/02/23   New Ongoing                      [name] participated in development of tx plan and provided verbal consent.     Chrissie Noa, PhD               Chrissie Noa, PhD

## 2023-01-29 ENCOUNTER — Other Ambulatory Visit (HOSPITAL_COMMUNITY): Payer: Self-pay

## 2023-01-29 ENCOUNTER — Other Ambulatory Visit: Payer: Self-pay

## 2023-02-03 ENCOUNTER — Other Ambulatory Visit (HOSPITAL_COMMUNITY): Payer: Self-pay

## 2023-02-03 ENCOUNTER — Other Ambulatory Visit: Payer: Self-pay

## 2023-02-04 ENCOUNTER — Ambulatory Visit (INDEPENDENT_AMBULATORY_CARE_PROVIDER_SITE_OTHER): Payer: 59 | Admitting: Clinical

## 2023-02-04 DIAGNOSIS — F840 Autistic disorder: Secondary | ICD-10-CM | POA: Diagnosis not present

## 2023-02-04 NOTE — Progress Notes (Signed)
Time: 11:00 am-11:55 am CPT Code: 16109U Diagnosis: F84.0  Jill Alexander was seen in person for individual therapy. Session focused on revisiting her coping strategies to understand what works for her most effectively. She indicated that playing with her kittens, sitting under a weighted blanket, going for a walk, and holding her warmable stuffed animal all help. For homework, she will try incorporating these strategies into her routine. Suicidal ideation and intent were denied. She is scheduled to be seen again in one week.  Presenting Problem Jill Alexander presented seeking CBT to help her manage difficulties with emotion regulation and reframing negative thought patterns that contribute to anxiety and worry. Jill Alexander has a diagnosis of Autism Spectrum Disorder (ASD). Symptoms feeling nervous or on edge, difficulty controlling worry, worrying about a variety of things, difficulty relaxing, irritability, fears for the future, feelings of hopelessness, difficulty sleeping, feeling tired, appetite irregularities, poor self-image, passive suicidal ideation without plan or intent (client contracted for safety during initial session) History of Problem  Jill Alexander reported that she moved from [place] with her family in July of 2018. Since that time, she has been seen by a provider in the area for challenges with emotion regulation and anxiety related to her diagnosis of autism spectrum disorder. Jill Alexander lives with her parents. She has a 29 year-old sister who has struggled with similar issues, as well as a brother who is three years older than she is and lives in [place]. Jill Alexander described a long history of therapy and therapeutic programs in order to help her manage these issues, including a partial hospitalization program in December of 2015.  Recent Trigger  Jill Alexander reported that she was seeking a change in pace with regard to her therapeutic treatment in order to ensure continued progress.  Marital and Family Information  Present  family concerns/problems: Jill Alexander shared several accounts of disputes with her mother, including her mother becoming upset with her for sharing worries with her immediately after her mother has woken up, despite her mother's requests that she not do so.  Strengths/resources in the family/friends: Jill Alexander described overall supportive relationships with her parents.  Marital/sexual history patterns: N/A  Family of Origin  Problems in family of origin: None reported.  Family background / ethnic factors: none reported.  No needs/concerns related to ethnicity reported when asked: No  Education/Vocation  Interpersonal concerns/problems: Jill Alexander reported that she has difficulties with social interactions related to her ASD diagnosis, and often perseverates about perceived missteps in both the immediate and distant past.  Personal strengths: Jill Alexander presented as motivated to improve her situation, and with some insight as to what is effective for her.  Military/work problems/concerns: None noted.  Leisure Activities/Daily Functioning  impaired functioning  Legal Status  No Legal Problems  Medical/Nutritional Concerns  unspecified  Comments: Jill Alexander reported that she often perseverates on perceived medical problems, and described herself as a hypochondriac  Religion/Spirituality  Not reported  Other  General Behavior: cooperative  Attire: appropriate  Gait: normal  Motor Activity: normal  Stream of Thought - Productivity: spontaneous  Stream of thought - Progression: normal  Stream of thought - Language: normal  Emotional tone and reactions - Affect: appropriate  Mental trend/Content of thoughts - Perception: normal  Mental trend/Content of thoughts - Orientation: normal  Mental trend/Content of thoughts - Memory: normal  Mental trend/Content of thoughts - General knowledge: consistent with education  Insight: good  Judgment: good  Intelligence: average  Diagnostic Summary  Autism Spectrum Disorder  (F84.0)   [facility] Counselor/Therapist Progress  Note   Patient ID: Jill Alexander, MRN: [id number],     Individualized Treatment Plan Strengths: Enjoys reading, Seeking counseling, In training program through [employer], "I like humor and jokes"  Supports: Her dog, her bestfriend since childhood, her parents, her sister    Goal/Needs for Treatment:  In order of importance to patient 1) Emotional Regulation/Anxiety 2) Improve Positive self talk      Client Statement of Needs: "Find ways of being nicer to myself", cope w/ anxiety and worry    Treatment Level:Outpatient Counseling  Symptoms:Anxiety, Perseverative worry, Negative self talk  Client Treatment Preferences:weekly counseling,  Prefers to be called Jill Alexander    Healthcare consumer's goal for treatment:  Dr. Charlyne Mom will support the patient's ability to achieve the goals identified. Cognitive Behavioral Therapy, Supportive Counseling, Mindfulness and Relaxation Strategies and other evidenced-based practices will be used to promote progress towards healthy functioning.    Healthcare consumer will: Actively participate in therapy, working towards healthy functioning.     *Justification for Continuation/Discontinuation of Goal: R=Revised, O=Ongoing, A=Achieved, D=Discontinued   Goal 1) Jill Alexander will develop strategies to regulate her emotions and improve her positive self talk Likert rating baseline date 04/12/21: How Easy - 3; How Often - 50% Target Date Goal Was reviewed Status Code Progress towards goal/Likert rating  05/02/23   New Ongoing                      Jill Alexander participated in development of tx plan and provided verbal consent.     Chrissie Noa, PhD               Chrissie Noa, PhD

## 2023-02-11 ENCOUNTER — Ambulatory Visit: Payer: 59 | Admitting: Clinical

## 2023-02-11 ENCOUNTER — Other Ambulatory Visit (HOSPITAL_COMMUNITY): Payer: Self-pay

## 2023-02-12 ENCOUNTER — Other Ambulatory Visit (HOSPITAL_COMMUNITY): Payer: Self-pay

## 2023-02-12 MED ORDER — LURASIDONE HCL 60 MG PO TABS
60.0000 mg | ORAL_TABLET | Freq: Every evening | ORAL | 1 refills | Status: DC
  Filled 2023-02-12: qty 30, 30d supply, fill #0

## 2023-02-13 ENCOUNTER — Other Ambulatory Visit (HOSPITAL_COMMUNITY): Payer: Self-pay

## 2023-02-13 ENCOUNTER — Ambulatory Visit: Payer: 59 | Admitting: Gastroenterology

## 2023-02-13 ENCOUNTER — Encounter: Payer: Self-pay | Admitting: Gastroenterology

## 2023-02-13 VITALS — BP 98/70 | HR 65 | Ht 62.0 in

## 2023-02-13 DIAGNOSIS — R1013 Epigastric pain: Secondary | ICD-10-CM | POA: Diagnosis not present

## 2023-02-13 DIAGNOSIS — K219 Gastro-esophageal reflux disease without esophagitis: Secondary | ICD-10-CM | POA: Diagnosis not present

## 2023-02-13 MED ORDER — FDGARD 25-20.75 MG PO CAPS: ORAL_CAPSULE | ORAL | Status: DC

## 2023-02-13 MED ORDER — PANTOPRAZOLE SODIUM 40 MG PO TBEC
40.0000 mg | DELAYED_RELEASE_TABLET | Freq: Every day | ORAL | 3 refills | Status: DC
  Filled 2023-02-13: qty 30, 30d supply, fill #0
  Filled 2023-02-24 – 2023-03-10 (×3): qty 30, 30d supply, fill #1
  Filled 2023-03-18 – 2023-04-22 (×2): qty 30, 30d supply, fill #2
  Filled 2023-05-06 – 2023-05-30 (×4): qty 30, 30d supply, fill #3

## 2023-02-13 NOTE — Patient Instructions (Addendum)
We have sent the following medications to your pharmacy for you to pick up at your convenience: Pantoprazole 40 mg: Take once daily  We have given you samples of the following medication to take: FDgard - take as directed  We are referring you to Nutrition.  They will contact you directly to schedule an appointment. It may take a week or more before you hear from them.  Please feel free to contact us if you have not heard from them within 2 weeks and we will follow up on the referral.    You will be contacted by [facility] Scheduling in the next 2 days to arrange a Right upper quadrant ultrasound.  The number on your caller ID will be [phone], please answer when they call.  If you have not heard from them in 2 days please call [phone] to schedule.     Thank you for entrusting me with your care and for choosing [facility], Dr. Ileene Patrick   If your blood pressure at your visit was 140/90 or greater, please contact your primary care physician to follow up on this. ______________________________________________________  If you are age 85 or older, your body mass index should be between 23-30. Your Body mass index is 24.33 kg/m. If this is out of the aforementioned range listed, please consider follow up with your Primary Care Provider.  If you are age 65 or younger, your body mass index should be between 19-25. Your Body mass index is 24.33 kg/m. If this is out of the aformentioned range listed, please consider follow up with your Primary Care Provider.  ________________________________________________________  The [facility] providers would like to encourage you to use [facility] to communicate with providers for non-urgent requests or questions.  Due to long hold times on the telephone, sending your provider a message by [facility] may be a faster and more efficient way to get a response.  Please allow 48 business hours for a response.  Please remember that this is  for non-urgent requests.  _______________________________________________________  Due to recent changes in healthcare laws, you may see the results of your imaging and laboratory studies on MyChart before your provider has had a chance to review them.  We understand that in some cases there may be results that are confusing or concerning to you. Not all laboratory results come back in the same time frame and the provider may be waiting for multiple results in order to interpret others.  Please give [place] 48 hours in order for your provider to thoroughly review all the results before contacting the office for clarification of your results.

## 2023-02-13 NOTE — Progress Notes (Signed)
HPI :  27 year old female here for a follow-up visit.  Recall she has history of chronic nausea, dyspepsia, reflux.  She has had a prior evaluation with EGD, negative celiac serologies, tested negative for H. pylori.  Has historically been on some omeprazole.  We had discussed doing a gastric emptying study in the past however ultimately was declined, gave her a trial of empiric Reglan in the past which did not really help her too much.  At our last visit in June 2022 I had recommended she continue the omeprazole as it did provide some benefit to her at the time.  This is her first time back since then.  Essentially her stomach continues to bother her.  She does endorse GERD with some pyrosis and burning in her chest as well as some epigastric discomfort.  She does endorse postprandial discomfort as well as pains that bother her when she is not eating.  She does have some clear food triggers that make her symptoms worse, she wonders what she can eat and what she should not eat.  She is specifically requesting to see a nutritionist today.  She is not having any nausea or vomiting lately.  She denies much of any early satiety.  In that regard she is doing a bit better.  Somewhere along the line she stopped her omeprazole and is not taking any of it at this time.  She takes Pepcid as needed which can help her.  We discussed other options today.  Weight seems stable.    EGD 04/20/2018 - normal esophagus, biopsies taken - no evidence of EoE, normal stomach - biopsies show no H pylori, normal duodenum - biopsies show no celiac   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     Past Surgical History:  Procedure Laterality Date   none     Family History   Problem Relation Age of Onset   Depression Father    Depression Sister    Anxiety disorder Sister    Diabetes Maternal Grandfather    Cancer Maternal Grandfather    Breast cancer Paternal Grandmother    Uterine cancer Paternal Grandmother    Irritable bowel syndrome Paternal Grandmother    Colon cancer Neg Hx    Esophageal cancer Neg Hx    Stomach cancer Neg Hx    Rectal cancer Neg Hx    Social History   Tobacco Use   Smoking status: Never   Smokeless tobacco: Never  Vaping Use   Vaping status: Never Used  Substance Use Topics   Alcohol use: Yes    Comment: 1 cocktail a few times a year   Drug use: No   Current Outpatient Medications  Medication Sig Dispense Refill   acetaminophen (TYLENOL) 500 MG tablet Take 500 mg by mouth every 6 (six) hours as needed.     Dextromethorphan-buPROPion ER (AUVELITY) 45-105 MG TBCR Take 1 tablet by mouth 2 (two) times daily. 60 tablet 2   Docusate Sodium (COLACE PO) Take by mouth.     drospirenone-ethinyl estradiol (YAZ) 3-0.02 MG tablet Take 1 tablet by mouth daily. 28 tablet 11   Famotidine (PEPCID PO) Take by mouth.  Ibuprofen (MOTRIN PO) Take 200 mg by mouth as needed.     lamoTRIgine (LAMICTAL) 200 MG tablet Take two tablets bu mouth daily 180 tablet 1   LORazepam (ATIVAN) 1 MG tablet Take 1 tablet (1 mg total) by mouth 2 (two) times daily as needed for anxiety. 60 tablet 1   lurasidone (LATUDA) 40 MG TABS tablet Take 1 tablet (40 mg total) by mouth daily with breakfast. 90 tablet 0   melatonin 1 MG TABS tablet Take 1 tablet (1 mg total) by mouth at bedtime as needed (insomnia). 30 tablet 0   mirabegron ER (MYRBETRIQ) 50 MG TB24 tablet Take 1 tablet (50 mg total) by mouth daily. 30 tablet 3   Multiple Vitamins-Minerals (MULTIVITAMIN PO) Take 1 tablet by mouth daily.     omeprazole (PRILOSEC) 20 MG capsule Take 1 capsule (20 mg total) by mouth daily. 30 capsule 5   polyethylene glycol (MIRALAX) packet Take 17 g by mouth daily as  needed. 14 each 0   rizatriptan (MAXALT) 10 MG tablet Take 1 tablet (10 mg total) by mouth as needed for migraine. May repeat in 2 hours if needed. 10 tablet 11   topiramate (TOPAMAX) 50 MG tablet Take 3 tablets (150 mg total) by mouth daily. 270 tablet 0   atomoxetine (STRATTERA) 25 MG capsule Take 1 capsule (25 mg total) by mouth daily. 30 capsule 1   atomoxetine (STRATTERA) 40 MG capsule Take 1 capsule (40 mg total) by mouth daily. 30 capsule 1   Dextromethorphan-buPROPion ER (AUVELITY) 45-105 MG TBCR Take 1 tablet by mouth 2 (two) times daily. (Patient not taking: Reported on 02/13/2023) 180 tablet 1   Dextromethorphan-buPROPion ER (AUVELITY) 45-105 MG TBCR Take 1 tablet by mouth 2 (two) times daily. (Patient not taking: Reported on 02/13/2023) 60 tablet 2   Dextromethorphan-buPROPion ER (AUVELITY) 45-105 MG TBCR Take 1 tablet by mouth 2 (two) times daily. (Patient not taking: Reported on 02/13/2023) 60 tablet 2   Fremanezumab-vfrm (AJOVY) 225 MG/1.5ML SOAJ Inject 225 mg into the skin every 30 (thirty) days. (Patient not taking: Reported on 02/13/2023) 1.5 mL 11   LORazepam (ATIVAN) 1 MG tablet Take 1 tablet (1 mg total) by mouth 2 (two) times daily as needed for severe anxiety (Patient not taking: Reported on 02/13/2023) 60 tablet 1   Lurasidone HCl 60 MG TABS Take 1 tablet (60 mg total) by mouth every evening with food. (Patient not taking: Reported on 02/13/2023) 30 tablet 1   omeprazole (PRILOSEC) 20 MG capsule Take 1 capsule (20 mg total) by mouth daily 30 minutes before morning meal (Patient not taking: Reported on 02/13/2023) 90 capsule 0   viloxazine ER (QELBREE) 200 MG 24 hr capsule Take 1 capsule (200 mg total) by mouth in the morning. 30 capsule 1   No current facility-administered medications for this visit.   Allergies  Allergen Reactions   Gluten Meal      Review of Systems: All systems reviewed and negative except where noted in HPI.   Lab Results  Component Value Date    WBC 6.1 09/25/2021   HGB 13.8 09/25/2021   HCT 40.5 09/25/2021   MCV 92 09/25/2021   PLT 264 09/25/2021    Lab Results  Component Value Date   NA 139 09/25/2021   CL 107 (H) 09/25/2021   K 4.1 09/25/2021   CO2 19 (L) 09/25/2021   BUN 17 09/25/2021   CREATININE 0.85 09/25/2021   EGFR 96 09/25/2021   CALCIUM 9.3 09/25/2021  ALBUMIN 4.4 09/25/2021   GLUCOSE 76 09/25/2021    Lab Results  Component Value Date   ALT 13 09/25/2021   AST 15 09/25/2021   ALKPHOS 51 09/25/2021   BILITOT <0.2 09/25/2021     Physical Exam: BP 98/70   Pulse 65   Ht 5\' 2"  (1.575 m)   BMI 24.33 kg/m  Constitutional: Pleasant,well-developed, female in no acute distress. Neurological: Alert and oriented to person place and time. Psychiatric: Normal mood and affect. Behavior is normal.   ASSESSMENT: 27 y.o. female here for assessment of the following  1. Gastroesophageal reflux disease, unspecified whether esophagitis present   2. Dyspepsia   3. Epigastric pain    Longstanding dyspepsia/upper abdominal discomfort, as well as some reflux.  Prior EGD negative and labs look okay.  Negative for H. pylori and celiac disease.  She has had some benefit with PPI, somewhere along the line since I last saw her she stopped the omeprazole.  She was having some breakthrough despite taking that.  I would like to place her back on PPI daily, would switch PPI at this point and give her a slightly higher dose, will try Protonix 40 mg daily for 30-day trial.  Hopefully good dose of antacid will provide some relief as it has in the past.  If she finds no benefit from that she will let me know.  Otherwise we will add some FDgard to treat component of dyspepsia to see if that helps.  Some free samples were provided to her today and she can get this over-the-counter if it helps.  Otherwise reviewed other potential contributing factors.  Given her postprandial upper abdominal discomfort I will screen her for gallstones,  will order a right upper quadrant ultrasound.  She is agreeable to this.  Otherwise recommend she avoid food triggers.  She really want to see nutritionist to to discuss diet in general, placed referral for this per her request.  If over time, right upper quadrant ultrasound negative, symptoms persist despite PPI, can consider CT imaging but symptoms appear chronic and stable at this time.   PLAN: - start protonix 40mg  / day - 30 day trial with refills (switching from omeprazole - she does not think she is taking at baseline for some time) - samples of FD gard provided to use PRN, can get OTC - RUQ [place] - rule out gallstones - referral to see nutritionist - f/u PRN or contact me if symptoms persist  Harlin Rain, MD [facility]

## 2023-02-14 ENCOUNTER — Ambulatory Visit (INDEPENDENT_AMBULATORY_CARE_PROVIDER_SITE_OTHER): Payer: 59 | Admitting: Clinical

## 2023-02-14 ENCOUNTER — Other Ambulatory Visit (HOSPITAL_COMMUNITY): Payer: Self-pay

## 2023-02-14 DIAGNOSIS — F840 Autistic disorder: Secondary | ICD-10-CM

## 2023-02-14 NOTE — Progress Notes (Signed)
Time: 10:00 am-10:55 am CPT Code: 78295A Diagnosis: F84.0  Jill Alexander was seen in person for individual therapy. She had reached out to the therapist via secure message saying that her coping strategies weren't working, but assuring the therapist that she was safe. Session focused on processing this. Jill Alexander indicated that she had been in a low mood, but her coping strategies that she had tried (watching funny TV shows, listening to podcasts), had worked when she had done them. Suicidal ideation and intent were denied. 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 For homework, she will try using her coping strategies before she becomes upset. She is scheduled to be seen again on Tuesday.   Presenting Problem Jill Alexander presented seeking CBT to help her manage difficulties with emotion regulation and reframing negative thought patterns that contribute to anxiety and worry. Jill Alexander has a diagnosis of Autism Spectrum Disorder (ASD). Symptoms feeling nervous or on edge, difficulty controlling worry, worrying about a variety of things, difficulty relaxing, irritability, fears for the future, feelings of hopelessness, difficulty sleeping, feeling tired, appetite irregularities, poor self-image, passive suicidal ideation without plan or intent (client contracted for safety during initial session) History of Problem  Jill Alexander reported that she moved from [place] with her family in July of 2018. Since that time, she has been seen by a provider in the area for challenges with emotion regulation and anxiety related to her diagnosis of autism spectrum disorder. Jill Alexander lives with her parents. She has a 48 year-old sister who has struggled with similar issues, as well as a brother who is three years older than she is and lives in [place]. Jill Alexander described a long history of therapy and therapeutic programs in order to help her manage these issues, including a partial hospitalization program in December of 2015.   Recent Trigger  Jill Alexander reported that she was seeking a change in pace with regard to her therapeutic treatment in order to ensure continued progress.  Marital and Family Information  Present family concerns/problems: Jill Alexander shared several accounts of disputes with her mother, including her mother becoming upset with her for sharing worries with her immediately after her mother has woken up, despite her mother's requests that she not do so.  Strengths/resources in the family/friends: Jill Alexander described overall supportive relationships with her parents.  Marital/sexual history patterns: N/A  Family of Origin  Problems in family of origin: None reported.  Family background / ethnic factors: none reported.  No needs/concerns related to ethnicity reported when asked: No  Education/Vocation  Interpersonal concerns/problems: Jill Alexander reported that she has difficulties with social interactions related to her ASD diagnosis, and often perseverates about perceived missteps in both the immediate and distant past.  Personal strengths: Jill Alexander presented as motivated to improve her situation, and with some insight as to what is effective for her.  Military/work problems/concerns: None noted.  Leisure Activities/Daily Functioning  impaired functioning  Legal Status  No Legal Problems  Medical/Nutritional Concerns  unspecified  Comments: Jill Alexander reported that she often perseverates on perceived medical problems, and described herself as a hypochondriac  Religion/Spirituality  Not reported  Other  General Behavior: cooperative  Attire: appropriate  Gait: normal  Motor Activity: normal  Stream of Thought - Productivity: spontaneous  Stream of thought - Progression: normal  Stream of thought - Language: normal  Emotional tone and reactions - Affect: appropriate  Mental trend/Content of thoughts - Perception: normal  Mental trend/Content of thoughts - Orientation: normal  Mental trend/Content of thoughts -  Memory: normal  Mental trend/Content of thoughts - General knowledge: consistent with education  Insight: good  Judgment: good  Intelligence: average  Diagnostic Summary  Autism Spectrum Disorder (F84.0)   [facility] Counselor/Therapist Progress Note   Patient ID: Jill Alexander, MRN: [id number],     Individualized Treatment Plan Strengths: Enjoys reading, Seeking counseling, In training program through [employer], "I like humor and jokes"  Supports: Her dog, her bestfriend since childhood, her parents, her sister    Goal/Needs for Treatment:  In order of importance to patient 1) Emotional Regulation/Anxiety 2) Improve Positive self talk      Client Statement of Needs: "Find ways of being nicer to myself", cope w/ anxiety and worry    Treatment Level:Outpatient Counseling  Symptoms:Anxiety, Perseverative worry, Negative self talk  Client Treatment Preferences:weekly counseling,  Prefers to be called Jill Alexander    Healthcare consumer's goal for treatment:  Dr. Charlyne Mom will support the patient's ability to achieve the goals identified. Cognitive Behavioral Therapy, Supportive Counseling, Mindfulness and Relaxation Strategies and other evidenced-based practices will be used to promote progress towards healthy functioning.    Healthcare consumer will: Actively participate in therapy, working towards healthy functioning.     *Justification for Continuation/Discontinuation of Goal: R=Revised, O=Ongoing, A=Achieved, D=Discontinued   Goal 1) Jill Alexander will develop strategies to regulate her emotions and improve her positive self talk Likert rating baseline date 04/12/21: How Easy - 3; How Often - 50% Target Date Goal Was reviewed Status Code Progress towards goal/Likert rating  05/02/23   New Ongoing                      Jill Alexander participated in development of tx plan and provided verbal consent.    Chrissie Noa, PhD               Chrissie Noa, PhD

## 2023-02-18 ENCOUNTER — Telehealth: Payer: 59 | Admitting: Emergency Medicine

## 2023-02-18 ENCOUNTER — Ambulatory Visit (INDEPENDENT_AMBULATORY_CARE_PROVIDER_SITE_OTHER): Payer: 59 | Admitting: Clinical

## 2023-02-18 DIAGNOSIS — R350 Frequency of micturition: Secondary | ICD-10-CM

## 2023-02-18 DIAGNOSIS — F840 Autistic disorder: Secondary | ICD-10-CM

## 2023-02-18 DIAGNOSIS — R197 Diarrhea, unspecified: Secondary | ICD-10-CM

## 2023-02-18 NOTE — Progress Notes (Signed)
Because you have symptoms that don't sound like a typical UTI, I feel your condition warrants further evaluation and I recommend that you be seen for a face to face visit.  Please contact your primary care physician practice or gynecology practice to be seen. Alternatively, you can be seen in an urgent care   NOTE: There will be NO CHARGE for this eVisit   If you are having a true medical emergency please call 911.      For an urgent face to face visit, [facility] has eight urgent care centers for your convenience:   NEW!! [facility] Health Urgent Care Center at [facility] Get Driving Directions [phone] [address], [address] [place], [address]    [facility] Health Urgent Care Center at [facility] Get Driving Directions [phone] [address] [place], [place] [address]   [facility] Center [facility]) Get Driving Directions [phone] [address] [place], [place] [address]  [facility] Health Urgent Care Center [facility]) Get Driving Directions [phone] [address] [place],  [place]  [address]  [facility] Health Urgent Care Center [facility] - at [employer]  [phone] [address] [place],  [place] [address]   [facility] Health Urgent Care at [facility] Get Driving Directions [phone] [address] [place], [place] [address]   [facility] Health Urgent Care at [facility] Get Driving Directions  [phone] [address] [place], [place] [address]   [facility] Health Urgent Care at [facility] Directions [phone] [address], [address] [place], [place] [address]  Your MyChart E-visit questionnaire answers were reviewed by a board certified advanced clinical practitioner to complete your personal care plan based on your specific symptoms.  Thank you for using e-Visits.

## 2023-02-18 NOTE — Progress Notes (Signed)
Time: 11:00 am-11:55 am CPT Code: 08657Q Diagnosis: F84.0  Jill Alexander was seen in person for individual therapy. 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 She also shared that she had learned of a close family member coming out about being transgender. She and the therapist created a plan to discuss further in her next session. She is scheduled to be seen again in one week.  Presenting Problem Jill Alexander presented seeking CBT to help her manage difficulties with emotion regulation and reframing negative thought patterns that contribute to anxiety and worry. Jill Alexander has a diagnosis of Autism Spectrum Disorder (ASD). Symptoms feeling nervous or on edge, difficulty controlling worry, worrying about a variety of things, difficulty relaxing, irritability, fears for the future, feelings of hopelessness, difficulty sleeping, feeling tired, appetite irregularities, poor self-image, passive suicidal ideation without plan or intent (client contracted for safety during initial session) History of Problem  Jill Alexander reported that she moved from [place] with her family in July of 2018. Since that time, she has been seen by a provider in the area for challenges with emotion regulation and anxiety related to her diagnosis of autism spectrum disorder. Jill Alexander lives with her parents. She has a 75 year-old sister who has struggled with similar issues, as well as a brother who is three years older than she is and lives in [place]. Jill Alexander described a long history of therapy and therapeutic programs in order to help her manage these issues, including a partial hospitalization program in December of 2015.  Recent Trigger  Jill Alexander reported that she was seeking a change in pace with regard to her therapeutic treatment in order to ensure continued progress.  Marital and Family Information  Present family concerns/problems: Jill Alexander shared several accounts of  disputes with her mother, including her mother becoming upset with her for sharing worries with her immediately after her mother has woken up, despite her mother's requests that she not do so.  Strengths/resources in the family/friends: Jill Alexander described overall supportive relationships with her parents.  Marital/sexual history patterns: N/A  Family of Origin  Problems in family of origin: None reported.  Family background / ethnic factors: none reported.  No needs/concerns related to ethnicity reported when asked: No  Education/Vocation  Interpersonal concerns/problems: Jill Alexander reported that she has difficulties with social interactions related to her ASD diagnosis, and often perseverates about perceived missteps in both the immediate and distant past.  Personal strengths: Jill Alexander presented as motivated to improve her situation, and with some insight as to what is effective for her.  Military/work problems/concerns: None noted.  Leisure Activities/Daily Functioning  impaired functioning  Legal Status  No Legal Problems  Medical/Nutritional Concerns  unspecified  Comments: Jill Alexander reported that she often perseverates on perceived medical problems, and described herself as a hypochondriac  Religion/Spirituality  Not reported  Other  General Behavior: cooperative  Attire: appropriate  Gait: normal  Motor Activity: normal  Stream of Thought - Productivity: spontaneous  Stream of thought - Progression: normal  Stream of thought - Language: normal  Emotional tone and reactions - Affect: appropriate  Mental trend/Content of thoughts - Perception: normal  Mental trend/Content of thoughts - Orientation: normal  Mental trend/Content of thoughts - Memory: normal  Mental trend/Content of thoughts - General knowledge: consistent with education  Insight: good  Judgment: good  Intelligence: average  Diagnostic Summary  Autism Spectrum Disorder (F84.0)   [facility] Counselor/Therapist  Progress Note   Patient  ID: Tameaka Cannoy, MRN: [id number],     Individualized Treatment Plan Strengths: Enjoys reading, Seeking counseling, In training program through [employer], "I like humor and jokes"  Supports: Her dog, her bestfriend since childhood, her parents, her sister    Goal/Needs for Treatment:  In order of importance to patient 1) Emotional Regulation/Anxiety 2) Improve Positive self talk      Client Statement of Needs: "Find ways of being nicer to myself", cope w/ anxiety and worry    Treatment Level:Outpatient Counseling  Symptoms:Anxiety, Perseverative worry, Negative self talk  Client Treatment Preferences:weekly counseling,  Prefers to be called Jill Alexander    Healthcare consumer's goal for treatment:  Dr. Charlyne Mom will support the patient's ability to achieve the goals identified. Cognitive Behavioral Therapy, Supportive Counseling, Mindfulness and Relaxation Strategies and other evidenced-based practices will be used to promote progress towards healthy functioning.    Healthcare consumer will: Actively participate in therapy, working towards healthy functioning.     *Justification for Continuation/Discontinuation of Goal: R=Revised, O=Ongoing, A=Achieved, D=Discontinued   Goal 1) Jill Alexander will develop strategies to regulate her emotions and improve her positive self talk Likert rating baseline date 04/12/21: How Easy - 3; How Often - 50% Target Date Goal Was reviewed Status Code Progress towards goal/Likert rating  05/02/23   New Ongoing                      Jill Alexander participated in development of tx plan and provided verbal consent.    Chrissie Noa, PhD               Chrissie Noa, PhD

## 2023-02-21 ENCOUNTER — Ambulatory Visit (INDEPENDENT_AMBULATORY_CARE_PROVIDER_SITE_OTHER): Payer: 59 | Admitting: Clinical

## 2023-02-21 ENCOUNTER — Ambulatory Visit (HOSPITAL_COMMUNITY)
Admission: RE | Admit: 2023-02-21 | Discharge: 2023-02-21 | Disposition: A | Discharge status: Home / Self Care | Payer: 59 | Source: Ambulatory Visit | Attending: Gastroenterology | Admitting: Gastroenterology

## 2023-02-21 DIAGNOSIS — R1013 Epigastric pain: Secondary | ICD-10-CM | POA: Diagnosis not present

## 2023-02-21 DIAGNOSIS — F840 Autistic disorder: Secondary | ICD-10-CM

## 2023-02-21 NOTE — Progress Notes (Signed)
Time: 4:00 pm-4:55 pm CPT Code: 62130Q-65 Diagnosis: F84.0  [name] was seen remotely using secure video conferencing. She was in her home and therapist was in her office at time of appointment. Client is aware of risks of telehealth and consented to a virtual visit. Therapist offered to see [name] for an additional session after she sent a message describing difficulty getting out of bed and feelings of hopelessness. [name] reflected upon a tendency not to allow herself to become excited about things for fear of disappointment. Therapist pointed out [name]'s own actions that suggested some hopefulness (e.g., going on dates, applying for jobs), and encouraged her to allow herself to manage her expectations while allowing some hopefulness. She is scheduled to be seen again on Tuesday. Suicidal ideation and intent were denied, and [name] assured the therapist that she is safe.   Presenting Problem [name] presented seeking CBT to help her manage difficulties with emotion regulation and reframing negative thought patterns that contribute to anxiety and worry. [name] has a diagnosis of Autism Spectrum Disorder (ASD). Symptoms feeling nervous or on edge, difficulty controlling worry, worrying about a variety of things, difficulty relaxing, irritability, fears for the future, feelings of hopelessness, difficulty sleeping, feeling tired, appetite irregularities, poor self-image, passive suicidal ideation without plan or intent (client contracted for safety during initial session) History of Problem  [name] reported that she moved from [place] with her family in July of 2018. Since that time, she has been seen by a provider in the area for challenges with emotion regulation and anxiety related to her diagnosis of autism spectrum disorder. [name] lives with her parents. She has a 22 year-old sister who has struggled with similar issues, as well as a brother who is three years older than she is and lives in [place]. [name]  described a long history of therapy and therapeutic programs in order to help her manage these issues, including a partial hospitalization program in December of 2015.  Recent Trigger  [name] reported that she was seeking a change in pace with regard to her therapeutic treatment in order to ensure continued progress.  Marital and Family Information  Present family concerns/problems: [name] shared several accounts of disputes with her mother, including her mother becoming upset with her for sharing worries with her immediately after her mother has woken up, despite her mother's requests that she not do so.  Strengths/resources in the family/friends: [name] described overall supportive relationships with her parents.  Marital/sexual history patterns: N/A  Family of Origin  Problems in family of origin: None reported.  Family background / ethnic factors: none reported.  No needs/concerns related to ethnicity reported when asked: No  Education/Vocation  Interpersonal concerns/problems: [name] reported that she has difficulties with social interactions related to her ASD diagnosis, and often perseverates about perceived missteps in both the immediate and distant past.  Personal strengths: [name] presented as motivated to improve her situation, and with some insight as to what is effective for her.  Military/work problems/concerns: None noted.  Leisure Activities/Daily Functioning  impaired functioning  Legal Status  No Legal Problems  Medical/Nutritional Concerns  unspecified  Comments: [name] reported that she often perseverates on perceived medical problems, and described herself as a hypochondriac  Religion/Spirituality  Not reported  Other  General Behavior: cooperative  Attire: appropriate  Gait: normal  Motor Activity: normal  Stream of Thought - Productivity: spontaneous  Stream of thought - Progression: normal  Stream of thought - Language: normal  Emotional tone and reactions - Affect:  appropriate  Mental trend/Content of thoughts - Perception: normal  Mental trend/Content of thoughts - Orientation: normal  Mental trend/Content of thoughts - Memory: normal  Mental trend/Content of thoughts - General knowledge: consistent with education  Insight: good  Judgment: good  Intelligence: average  Diagnostic Summary  Autism Spectrum Disorder (F84.0)   [facility] Counselor/Therapist Progress Note   Patient ID: Celita Hegger, MRN: [id number],     Individualized Treatment Plan Strengths: Enjoys reading, Seeking counseling, In training program through [employer], "I like humor and jokes"  Supports: Her dog, her bestfriend since childhood, her parents, her sister    Goal/Needs for Treatment:  In order of importance to patient 1) Emotional Regulation/Anxiety 2) Improve Positive self talk      Client Statement of Needs: "Find ways of being nicer to myself", cope w/ anxiety and worry    Treatment Level:Outpatient Counseling  Symptoms:Anxiety, Perseverative worry, Negative self talk  Client Treatment Preferences:weekly counseling,  Prefers to be called [name]    Healthcare consumer's goal for treatment:  Dr. Charlyne Mom will support the patient's ability to achieve the goals identified. Cognitive Behavioral Therapy, Supportive Counseling, Mindfulness and Relaxation Strategies and other evidenced-based practices will be used to promote progress towards healthy functioning.    Healthcare consumer will: Actively participate in therapy, working towards healthy functioning.     *Justification for Continuation/Discontinuation of Goal: R=Revised, O=Ongoing, A=Achieved, D=Discontinued   Goal 1) [name] will develop strategies to regulate her emotions and improve her positive self talk Likert rating baseline date 04/12/21: How Easy - 3; How Often - 50% Target Date Goal Was reviewed Status Code Progress towards goal/Likert rating  05/02/23   New Ongoing                       [name] participated in development of tx plan and provided verbal consent.   Chrissie Noa, PhD               Chrissie Noa, PhD

## 2023-02-24 ENCOUNTER — Other Ambulatory Visit (HOSPITAL_COMMUNITY): Payer: Self-pay

## 2023-02-24 DIAGNOSIS — Z23 Encounter for immunization: Secondary | ICD-10-CM | POA: Diagnosis not present

## 2023-02-24 DIAGNOSIS — Z13220 Encounter for screening for lipoid disorders: Secondary | ICD-10-CM | POA: Diagnosis not present

## 2023-02-24 DIAGNOSIS — Z0000 Encounter for general adult medical examination without abnormal findings: Secondary | ICD-10-CM | POA: Diagnosis not present

## 2023-02-24 DIAGNOSIS — H6123 Impacted cerumen, bilateral: Secondary | ICD-10-CM | POA: Diagnosis not present

## 2023-02-24 MED ORDER — TOPIRAMATE 50 MG PO TABS
150.0000 mg | ORAL_TABLET | Freq: Every day | ORAL | 0 refills | Status: DC
  Filled 2023-02-25 – 2023-04-11 (×6): qty 270, 90d supply, fill #0

## 2023-02-25 ENCOUNTER — Other Ambulatory Visit: Payer: Self-pay | Admitting: Gastroenterology

## 2023-02-25 ENCOUNTER — Other Ambulatory Visit: Payer: Self-pay

## 2023-02-25 ENCOUNTER — Other Ambulatory Visit (HOSPITAL_COMMUNITY): Payer: Self-pay

## 2023-02-25 ENCOUNTER — Ambulatory Visit (INDEPENDENT_AMBULATORY_CARE_PROVIDER_SITE_OTHER): Payer: 59 | Admitting: Clinical

## 2023-02-25 DIAGNOSIS — F840 Autistic disorder: Secondary | ICD-10-CM

## 2023-02-25 NOTE — Progress Notes (Signed)
Time: 11:00 am-11:55 am CPT Code: [id number] Diagnosis: F84.0  Jill Alexander was seen in person for therapy. Session focused on processing a conversation she had had with her older sibling about her sibling's gender identity. She also shared that she had gone on three dates with someone, and was having hesitation about a fourth date. Therapist processed this with her, exploring pros and cons with her. She is scheduled to be seen again in one week.  Presenting Problem Jill Alexander presented seeking CBT to help her manage difficulties with emotion regulation and reframing negative thought patterns that contribute to anxiety and worry. Jill Alexander has a diagnosis of Autism Spectrum Disorder (ASD). Symptoms feeling nervous or on edge, difficulty controlling worry, worrying about a variety of things, difficulty relaxing, irritability, fears for the future, feelings of hopelessness, difficulty sleeping, feeling tired, appetite irregularities, poor self-image, passive suicidal ideation without plan or intent (client contracted for safety during initial session) History of Problem  Jill Alexander reported that she moved from [place] with her family in July of 2018. Since that time, she has been seen by a provider in the area for challenges with emotion regulation and anxiety related to her diagnosis of autism spectrum disorder. Jill Alexander lives with her parents. She has a 56 year-old sister who has struggled with similar issues, as well as a brother who is three years older than she is and lives in [place]. Jill Alexander described a long history of therapy and therapeutic programs in order to help her manage these issues, including a partial hospitalization program in December of 2015.  Recent Trigger  Jill Alexander reported that she was seeking a change in pace with regard to her therapeutic treatment in order to ensure continued progress.  Marital and Family Information  Present family concerns/problems: Jill Alexander shared several accounts of disputes with her  mother, including her mother becoming upset with her for sharing worries with her immediately after her mother has woken up, despite her mother's requests that she not do so.  Strengths/resources in the family/friends: Jill Alexander described overall supportive relationships with her parents.  Marital/sexual history patterns: N/A  Family of Origin  Problems in family of origin: None reported.  Family background / ethnic factors: none reported.  No needs/concerns related to ethnicity reported when asked: No  Education/Vocation  Interpersonal concerns/problems: Jill Alexander reported that she has difficulties with social interactions related to her ASD diagnosis, and often perseverates about perceived missteps in both the immediate and distant past.  Personal strengths: Jill Alexander presented as motivated to improve her situation, and with some insight as to what is effective for her.  Military/work problems/concerns: None noted.  Leisure Activities/Daily Functioning  impaired functioning  Legal Status  No Legal Problems  Medical/Nutritional Concerns  unspecified  Comments: Jill Alexander reported that she often perseverates on perceived medical problems, and described herself as a hypochondriac  Religion/Spirituality  Not reported  Other  General Behavior: cooperative  Attire: appropriate  Gait: normal  Motor Activity: normal  Stream of Thought - Productivity: spontaneous  Stream of thought - Progression: normal  Stream of thought - Language: normal  Emotional tone and reactions - Affect: appropriate  Mental trend/Content of thoughts - Perception: normal  Mental trend/Content of thoughts - Orientation: normal  Mental trend/Content of thoughts - Memory: normal  Mental trend/Content of thoughts - General knowledge: consistent with education  Insight: good  Judgment: good  Intelligence: average  Diagnostic Summary  Autism Spectrum Disorder (F84.0)   [facility] Counselor/Therapist Progress Note    Patient ID: [name],  MRN: [id number],     Individualized Treatment Plan Strengths: Enjoys reading, Seeking counseling, In training program through [employer], "I like humor and jokes"  Supports: Her dog, her bestfriend since childhood, her parents, her sister    Goal/Needs for Treatment:  In order of importance to patient 1) Emotional Regulation/Anxiety 2) Improve Positive self talk      Client Statement of Needs: "Find ways of being nicer to myself", cope w/ anxiety and worry    Treatment Level:Outpatient Counseling  Symptoms:Anxiety, Perseverative worry, Negative self talk  Client Treatment Preferences:weekly counseling,  Prefers to be called Jill Alexander    Healthcare consumer's goal for treatment:  Dr. Charlyne Mom will support the patient's ability to achieve the goals identified. Cognitive Behavioral Therapy, Supportive Counseling, Mindfulness and Relaxation Strategies and other evidenced-based practices will be used to promote progress towards healthy functioning.    Healthcare consumer will: Actively participate in therapy, working towards healthy functioning.     *Justification for Continuation/Discontinuation of Goal: R=Revised, O=Ongoing, A=Achieved, D=Discontinued   Goal 1) Jill Alexander will develop strategies to regulate her emotions and improve her positive self talk Likert rating baseline date 04/12/21: How Easy - 3; How Often - 50% Target Date Goal Was reviewed Status Code Progress towards goal/Likert rating  05/02/23   New Ongoing                      Jill Alexander participated in development of tx plan and provided verbal consent.       Chrissie Noa, PhD               Chrissie Noa, PhD

## 2023-02-28 ENCOUNTER — Encounter (INDEPENDENT_AMBULATORY_CARE_PROVIDER_SITE_OTHER): Payer: Self-pay | Admitting: Otolaryngology

## 2023-03-04 ENCOUNTER — Ambulatory Visit: Payer: 59 | Admitting: Clinical

## 2023-03-04 DIAGNOSIS — F840 Autistic disorder: Secondary | ICD-10-CM | POA: Diagnosis not present

## 2023-03-04 NOTE — Progress Notes (Signed)
Time: 11:00 am-11:55 am CPT Code: 16109U Diagnosis: F84.0  Jill Alexander was seen in person for therapy. Session focused on her anxiety about the election, as well as how she had communicated to an interest that she was not interested in going on a third date. Therapist engaged her in discussion of her experiences of previous elections, and encouraged her to consider coping strategies. She is scheduled to be seen again in one week.  Presenting Problem Jill Alexander presented seeking CBT to help her manage difficulties with emotion regulation and reframing negative thought patterns that contribute to anxiety and worry. Jill Alexander has a diagnosis of Autism Spectrum Disorder (ASD). Symptoms feeling nervous or on edge, difficulty controlling worry, worrying about a variety of things, difficulty relaxing, irritability, fears for the future, feelings of hopelessness, difficulty sleeping, feeling tired, appetite irregularities, poor self-image, passive suicidal ideation without plan or intent (client contracted for safety during initial session) History of Problem  Jill Alexander reported that she moved from [place] with her family in July of 2018. Since that time, she has been seen by a provider in the area for challenges with emotion regulation and anxiety related to her diagnosis of autism spectrum disorder. Jill Alexander lives with her parents. She has a 61 year-old sister who has struggled with similar issues, as well as a brother who is three years older than she is and lives in [place]. Jill Alexander described a long history of therapy and therapeutic programs in order to help her manage these issues, including a partial hospitalization program in December of 2015.  Recent Trigger  Jill Alexander reported that she was seeking a change in pace with regard to her therapeutic treatment in order to ensure continued progress.  Marital and Family Information  Present family concerns/problems: Jill Alexander shared several accounts of disputes with her mother, including  her mother becoming upset with her for sharing worries with her immediately after her mother has woken up, despite her mother's requests that she not do so.  Strengths/resources in the family/friends: Jill Alexander described overall supportive relationships with her parents.  Marital/sexual history patterns: N/A  Family of Origin  Problems in family of origin: None reported.  Family background / ethnic factors: none reported.  No needs/concerns related to ethnicity reported when asked: No  Education/Vocation  Interpersonal concerns/problems: Jill Alexander reported that she has difficulties with social interactions related to her ASD diagnosis, and often perseverates about perceived missteps in both the immediate and distant past.  Personal strengths: Jill Alexander presented as motivated to improve her situation, and with some insight as to what is effective for her.  Military/work problems/concerns: None noted.  Leisure Activities/Daily Functioning  impaired functioning  Legal Status  No Legal Problems  Medical/Nutritional Concerns  unspecified  Comments: Jill Alexander reported that she often perseverates on perceived medical problems, and described herself as a hypochondriac  Religion/Spirituality  Not reported  Other  General Behavior: cooperative  Attire: appropriate  Gait: normal  Motor Activity: normal  Stream of Thought - Productivity: spontaneous  Stream of thought - Progression: normal  Stream of thought - Language: normal  Emotional tone and reactions - Affect: appropriate  Mental trend/Content of thoughts - Perception: normal  Mental trend/Content of thoughts - Orientation: normal  Mental trend/Content of thoughts - Memory: normal  Mental trend/Content of thoughts - General knowledge: consistent with education  Insight: good  Judgment: good  Intelligence: average  Diagnostic Summary  Autism Spectrum Disorder (F84.0)   [facility] Counselor/Therapist Progress Note   Patient ID:  Jill Alexander, MRN: [id number],  Individualized Treatment Plan Strengths: Enjoys reading, Seeking counseling, In training program through [employer], "I like humor and jokes"  Supports: Her dog, her bestfriend since childhood, her parents, her sister    Goal/Needs for Treatment:  In order of importance to patient 1) Emotional Regulation/Anxiety 2) Improve Positive self talk      Client Statement of Needs: "Find ways of being nicer to myself", cope w/ anxiety and worry    Treatment Level:Outpatient Counseling  Symptoms:Anxiety, Perseverative worry, Negative self talk  Client Treatment Preferences:weekly counseling,  Prefers to be called Jill Alexander    Healthcare consumer's goal for treatment:  Dr. Charlyne Mom will support the patient's ability to achieve the goals identified. Cognitive Behavioral Therapy, Supportive Counseling, Mindfulness and Relaxation Strategies and other evidenced-based practices will be used to promote progress towards healthy functioning.    Healthcare consumer will: Actively participate in therapy, working towards healthy functioning.     *Justification for Continuation/Discontinuation of Goal: R=Revised, O=Ongoing, A=Achieved, D=Discontinued   Goal 1) Jill Alexander will develop strategies to regulate her emotions and improve her positive self talk Likert rating baseline date 04/12/21: How Easy - 3; How Often - 50% Target Date Goal Was reviewed Status Code Progress towards goal/Likert rating  05/02/23   New Ongoing                      Jill Alexander participated in development of tx plan and provided verbal consent.     Chrissie Noa, PhD               Chrissie Noa, PhD

## 2023-03-05 ENCOUNTER — Other Ambulatory Visit (HOSPITAL_COMMUNITY): Payer: Self-pay

## 2023-03-05 ENCOUNTER — Other Ambulatory Visit (HOSPITAL_COMMUNITY): Payer: Self-pay | Admitting: Psychiatry

## 2023-03-05 DIAGNOSIS — F411 Generalized anxiety disorder: Secondary | ICD-10-CM

## 2023-03-05 DIAGNOSIS — F410 Panic disorder [episodic paroxysmal anxiety] without agoraphobia: Secondary | ICD-10-CM

## 2023-03-06 ENCOUNTER — Encounter (INDEPENDENT_AMBULATORY_CARE_PROVIDER_SITE_OTHER): Payer: Self-pay

## 2023-03-06 ENCOUNTER — Telehealth (INDEPENDENT_AMBULATORY_CARE_PROVIDER_SITE_OTHER): Payer: Self-pay | Admitting: Otolaryngology

## 2023-03-06 ENCOUNTER — Other Ambulatory Visit (HOSPITAL_COMMUNITY): Payer: Self-pay

## 2023-03-06 ENCOUNTER — Other Ambulatory Visit: Payer: Self-pay

## 2023-03-06 NOTE — Telephone Encounter (Signed)
Called patient to confirm location for appointment, no answer. Left mychart message

## 2023-03-07 ENCOUNTER — Other Ambulatory Visit (HOSPITAL_COMMUNITY): Payer: Self-pay

## 2023-03-07 ENCOUNTER — Ambulatory Visit (INDEPENDENT_AMBULATORY_CARE_PROVIDER_SITE_OTHER): Payer: 59 | Admitting: Physician Assistant

## 2023-03-07 DIAGNOSIS — H6123 Impacted cerumen, bilateral: Secondary | ICD-10-CM

## 2023-03-07 MED ORDER — OFLOXACIN 0.3 % OT SOLN
5.0000 [drp] | Freq: Every day | OTIC | 0 refills | Status: DC
  Filled 2023-03-07: qty 5, 20d supply, fill #0

## 2023-03-07 NOTE — Progress Notes (Signed)
Dear Dr. Jackquline Bosch, Here is my assessment for our mutual patient, Jill Alexander. Thank you for allowing me the opportunity to care for your patient. Please do not hesitate to contact me should you have any other questions. Sincerely, Burna Forts PA-C  Otolaryngology Clinic Note Referring provider: Dr. Jackquline Bosch HPI:  Jill Alexander is a 27 y.o. female kindly referred by Dr. Jackquline Bosch for evaluation of cerumen impaction.  The patient notes that she had a physical with her primary care provider last week.  It was noted that she had cerumen impacted in bilateral ears.  The attempted to remove the cerumen, she notes this was uncomfortable and they were unable to completely remove the cerumen.  She notes some discomfort in her years after the procedure but no significant pain.  She notes that her hearing is muffled on the left.  [facility] was [facility], they were unable to perform the audiogram due to the cerumen.  Jill Alexander.  She denies any infectious signs or symptoms, she denies any trauma to her ears, no history head and neck surgery, no history of chronic sinus disease.    Independent Review of Additional Tests or Records:  None   PMH/Meds/All/SocHx/FamHx/ROS:   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     Past Surgical History:  Procedure Laterality Date   none      Family History  Problem Relation Age of Onset   Depression Father    Depression Sister    Anxiety disorder Sister    Diabetes Maternal Grandfather    Cancer Maternal Grandfather    Breast cancer Paternal Grandmother    Uterine cancer Paternal Grandmother    Irritable bowel syndrome Paternal Grandmother    Colon cancer Neg Hx     Esophageal cancer Neg Hx    Stomach cancer Neg Hx    Rectal cancer Neg Hx      Social Connections: Unknown (06/29/2018)   Social Connection and Isolation Panel [NHANES]    Frequency of Communication with Friends and Family: Not on file    Frequency of Social Gatherings with Friends and Family: Not on file    Attends Religious Services: Not on file    Active Member of Clubs or Organizations: Not on file    Attends Banker Meetings: Not on file    Marital Status: Never married      Current Outpatient Medications:    acetaminophen (TYLENOL) 500 MG tablet, Take 500 mg by mouth every 6 (six) hours as needed., Disp: , Rfl:    atomoxetine (STRATTERA) 25 MG capsule, Take 1 capsule (25 mg total) by mouth daily., Disp: 30 capsule, Rfl: 1   atomoxetine (STRATTERA) 40 MG capsule, Take 1 capsule (40 mg total) by mouth daily., Disp: 30 capsule, Rfl: 1   Caraway Oil-Levomenthol (FDGARD) 25-20.75 MG CAPS, Take as needed, Disp: , Rfl:    Dextromethorphan-buPROPion ER (AUVELITY) 45-105 MG TBCR, Take 1 tablet by mouth 2 (two) times daily., Disp: 60 tablet, Rfl: 2   Dextromethorphan-buPROPion ER (AUVELITY) 45-105 MG TBCR, Take 1 tablet by mouth 2 (two) times daily. (Patient not taking: Reported on 02/13/2023), Disp: 180  tablet, Rfl: 1   Dextromethorphan-buPROPion ER (AUVELITY) 45-105 MG TBCR, Take 1 tablet by mouth 2 (two) times daily. (Patient not taking: Reported on 02/13/2023), Disp: 60 tablet, Rfl: 2   Dextromethorphan-buPROPion ER (AUVELITY) 45-105 MG TBCR, Take 1 tablet by mouth 2 (two) times daily. (Patient not taking: Reported on 02/13/2023), Disp: 60 tablet, Rfl: 2   Docusate Sodium (COLACE PO), Take by mouth., Disp: , Rfl:    drospirenone-ethinyl estradiol (YAZ) 3-0.02 MG tablet, Take 1 tablet by mouth daily., Disp: 28 tablet, Rfl: 11   Famotidine (PEPCID PO), Take by mouth., Disp: , Rfl:    Fremanezumab-vfrm (AJOVY) 225 MG/1.5ML SOAJ, Inject 225 mg into the skin every 30 (thirty) days.  (Patient not taking: Reported on 02/13/2023), Disp: 1.5 mL, Rfl: 11   Ibuprofen (MOTRIN PO), Take 200 mg by mouth as needed., Disp: , Rfl:    lamoTRIgine (LAMICTAL) 200 MG tablet, Take two tablets bu mouth daily, Disp: 180 tablet, Rfl: 1   LORazepam (ATIVAN) 1 MG tablet, Take 1 tablet (1 mg total) by mouth 2 (two) times daily as needed for anxiety., Disp: 60 tablet, Rfl: 1   LORazepam (ATIVAN) 1 MG tablet, Take 1 tablet (1 mg total) by mouth 2 (two) times daily as needed for severe anxiety (Patient not taking: Reported on 02/13/2023), Disp: 60 tablet, Rfl: 1   lurasidone (LATUDA) 40 MG TABS tablet, Take 1 tablet (40 mg total) by mouth daily with breakfast., Disp: 90 tablet, Rfl: 0   Lurasidone HCl 60 MG TABS, Take 1 tablet (60 mg total) by mouth every evening with food. (Patient not taking: Reported on 02/13/2023), Disp: 30 tablet, Rfl: 1   melatonin 1 MG TABS tablet, Take 1 tablet (1 mg total) by mouth at bedtime as needed (insomnia)., Disp: 30 tablet, Rfl: 0   mirabegron ER (MYRBETRIQ) 50 MG TB24 tablet, Take 1 tablet (50 mg total) by mouth daily., Disp: 30 tablet, Rfl: 3   Multiple Vitamins-Minerals (MULTIVITAMIN PO), Take 1 tablet by mouth daily., Disp: , Rfl:    pantoprazole (PROTONIX) 40 MG tablet, Take 1 tablet (40 mg total) by mouth daily., Disp: 30 tablet, Rfl: 3   polyethylene glycol (MIRALAX) packet, Take 17 g by mouth daily as needed., Disp: 14 each, Rfl: 0   rizatriptan (MAXALT) 10 MG tablet, Take 1 tablet (10 mg total) by mouth as needed for migraine. May repeat in 2 hours if needed., Disp: 10 tablet, Rfl: 11   topiramate (TOPAMAX) 50 MG tablet, Take 3 tablets (150 mg total) by mouth daily., Disp: 270 tablet, Rfl: 0   viloxazine ER (QELBREE) 200 MG 24 hr capsule, Take 1 capsule (200 mg total) by mouth in the morning., Disp: 30 capsule, Rfl: 1   Physical Exam:   There were no vitals taken for this visit.  Pertinent Findings  CN II-XII intact Right external auditory canal with  abrasion, no surrounding redness, there is some inflammation of the canal, cerumen impaction noted on the right, left auditory canal without significant swelling but cerumen impaction noted Weber 512: Equal Rinne 512: AC > BC b/l  Anterior rhinoscopy: Septum midline; bilateral inferior turbinates with no hypertrophy No lesions of oral cavity/oropharynx; dentition  No obviously palpable neck masses/lymphadenopathy/thyromegaly No respiratory distress or stridor  Seprately Identifiable Procedures:  Procedure: Bilateral ear microscopy and cerumen Alexander using microscope (CPT 16109) - Mod 25 Pre-procedure diagnosis: cerumen impaction bilateral external ears Post-procedure diagnosis: same Indication:  cerumen impaction; given patient's otologic complaints and history as well as for improved  and comprehensive examination of external ear and tympanic membrane, bilateral otologic examination using microscope was performed and impacted cerumen removed  Procedure: Patient was placed semi-recumbent. Both ear canals were examined using the microscope with findings above. Cerumen removed on left and on right using suction and currette with improvement in EAC examination and patency. Left: EAC was patent. TM was intact . Middle ear was aerated. Drainage: None Right: EAC was patent. TM was intact . Middle ear was aerated . Drainage: None Patient tolerated the procedure well.    Impression & Plans:  Brendolyn Birts is a 28 y.o. female with cerumen impaction.  I was able to successfully remove the cerumen from the bilateral ears, status post impaction she had intact TM with well pneumatized middle ear.  She did have some irritation along the right external auditory canal, I have placed her on Floxin for this.  The patient would like to continue her follow-up with audiology as previously scheduled.  I advised her that I am happy to have audiology here perform the exam and follow-up with her but she would like to  continue her previous plan.  She will reach out to [place] if she has any further questions or concerns in the future.  She had no further questions or concerns at today's visit.   Thank you for allowing me the opportunity to care for your patient. Please do not hesitate to contact me should you have any other questions.  Sincerely, Burna Forts PA-C [facility] Phone: [phone] Fax: [phone]  03/07/2023, 1:22 PM

## 2023-03-07 NOTE — Patient Instructions (Signed)
Please use eardrops as recommended, please follow-up in our office you have any further questions or concerns.

## 2023-03-10 ENCOUNTER — Other Ambulatory Visit: Payer: Self-pay

## 2023-03-11 ENCOUNTER — Other Ambulatory Visit (HOSPITAL_COMMUNITY): Payer: Self-pay

## 2023-03-11 ENCOUNTER — Ambulatory Visit: Payer: 59 | Admitting: Clinical

## 2023-03-11 ENCOUNTER — Other Ambulatory Visit: Payer: Self-pay

## 2023-03-11 DIAGNOSIS — F411 Generalized anxiety disorder: Secondary | ICD-10-CM | POA: Diagnosis not present

## 2023-03-11 DIAGNOSIS — F840 Autistic disorder: Secondary | ICD-10-CM

## 2023-03-11 DIAGNOSIS — F331 Major depressive disorder, recurrent, moderate: Secondary | ICD-10-CM | POA: Diagnosis not present

## 2023-03-11 MED ORDER — LORAZEPAM 1 MG PO TABS
1.0000 mg | ORAL_TABLET | Freq: Two times a day (BID) | ORAL | 1 refills | Status: DC | PRN
  Filled 2023-03-11: qty 60, 30d supply, fill #0

## 2023-03-11 MED ORDER — DESVENLAFAXINE SUCCINATE ER 50 MG PO TB24
50.0000 mg | ORAL_TABLET | Freq: Every day | ORAL | 1 refills | Status: DC
  Filled 2023-03-11: qty 30, 30d supply, fill #0
  Filled 2023-03-25 – 2023-04-07 (×2): qty 30, 30d supply, fill #1

## 2023-03-11 MED ORDER — LURASIDONE HCL 60 MG PO TABS
60.0000 mg | ORAL_TABLET | Freq: Every evening | ORAL | 1 refills | Status: DC
  Filled 2023-03-11: qty 90, 90d supply, fill #0
  Filled 2023-05-06 – 2023-05-30 (×3): qty 90, 90d supply, fill #1

## 2023-03-11 NOTE — Progress Notes (Signed)
Time: 11:00 am-11:55 am CPT Code: 96295M Diagnosis: F84.0  Jill Alexander was seen in person for therapy. Session focused on processing her feelings about the election. She had reached out to the therapist via secure message to express feelings of hopelessness, but shared that she is unsure whether that is how she actually feels, or whether it is how she thinks she "should" feel. She shared that she continues to wrestle with the remission in her depression, and what it means for her identity. She also reported an increase in anxiety. She is scheduled to be seen again in one week.   Presenting Problem Jill Alexander presented seeking CBT to help her manage difficulties with emotion regulation and reframing negative thought patterns that contribute to anxiety and worry. Jill Alexander has a diagnosis of Autism Spectrum Disorder (ASD). Symptoms feeling nervous or on edge, difficulty controlling worry, worrying about a variety of things, difficulty relaxing, irritability, fears for the future, feelings of hopelessness, difficulty sleeping, feeling tired, appetite irregularities, poor self-image, passive suicidal ideation without plan or intent (client contracted for safety during initial session) History of Problem  Jill Alexander reported that she moved from [place] with her family in July of 2018. Since that time, she has been seen by a provider in the area for challenges with emotion regulation and anxiety related to her diagnosis of autism spectrum disorder. Jill Alexander lives with her parents. She has a 68 year-old sister who has struggled with similar issues, as well as a brother who is three years older than she is and lives in [place]. Jill Alexander described a long history of therapy and therapeutic programs in order to help her manage these issues, including a partial hospitalization program in December of 2015.  Recent Trigger  Jill Alexander reported that she was seeking a change in pace with regard to her therapeutic treatment in order to ensure  continued progress.  Marital and Family Information  Present family concerns/problems: Jill Alexander shared several accounts of disputes with her mother, including her mother becoming upset with her for sharing worries with her immediately after her mother has woken up, despite her mother's requests that she not do so.  Strengths/resources in the family/friends: Jill Alexander described overall supportive relationships with her parents.  Marital/sexual history patterns: N/A  Family of Origin  Problems in family of origin: None reported.  Family background / ethnic factors: none reported.  No needs/concerns related to ethnicity reported when asked: No  Education/Vocation  Interpersonal concerns/problems: Jill Alexander reported that she has difficulties with social interactions related to her ASD diagnosis, and often perseverates about perceived missteps in both the immediate and distant past.  Personal strengths: Jill Alexander presented as motivated to improve her situation, and with some insight as to what is effective for her.  Military/work problems/concerns: None noted.  Leisure Activities/Daily Functioning  impaired functioning  Legal Status  No Legal Problems  Medical/Nutritional Concerns  unspecified  Comments: Jill Alexander reported that she often perseverates on perceived medical problems, and described herself as a hypochondriac  Religion/Spirituality  Not reported  Other  General Behavior: cooperative  Attire: appropriate  Gait: normal  Motor Activity: normal  Stream of Thought - Productivity: spontaneous  Stream of thought - Progression: normal  Stream of thought - Language: normal  Emotional tone and reactions - Affect: appropriate  Mental trend/Content of thoughts - Perception: normal  Mental trend/Content of thoughts - Orientation: normal  Mental trend/Content of thoughts - Memory: normal  Mental trend/Content of thoughts - General knowledge: consistent with education  Insight: good  Judgment: good  Intelligence: average  Diagnostic Summary  Autism Spectrum Disorder (F84.0)   [facility] Counselor/Therapist Progress Note   Patient ID: Rebacca Rumery, MRN: [id number],     Individualized Treatment Plan Strengths: Enjoys reading, Seeking counseling, In training program through [employer], "I like humor and jokes"  Supports: Her dog, her bestfriend since childhood, her parents, her sister    Goal/Needs for Treatment:  In order of importance to patient 1) Emotional Regulation/Anxiety 2) Improve Positive self talk      Client Statement of Needs: "Find ways of being nicer to myself", cope w/ anxiety and worry    Treatment Level:Outpatient Counseling  Symptoms:Anxiety, Perseverative worry, Negative self talk  Client Treatment Preferences:weekly counseling,  Prefers to be called Jill Alexander    Healthcare consumer's goal for treatment:  Dr. Charlyne Mom will support the patient's ability to achieve the goals identified. Cognitive Behavioral Therapy, Supportive Counseling, Mindfulness and Relaxation Strategies and other evidenced-based practices will be used to promote progress towards healthy functioning.    Healthcare consumer will: Actively participate in therapy, working towards healthy functioning.     *Justification for Continuation/Discontinuation of Goal: R=Revised, O=Ongoing, A=Achieved, D=Discontinued   Goal 1) Jill Alexander will develop strategies to regulate her emotions and improve her positive self talk Likert rating baseline date 04/12/21: How Easy - 3; How Often - 50% Target Date Goal Was reviewed Status Code Progress towards goal/Likert rating  05/02/23   New Ongoing                      Jill Alexander participated in development of tx plan and provided verbal consent.    Chrissie Noa, PhD               Chrissie Noa, PhD

## 2023-03-12 ENCOUNTER — Other Ambulatory Visit: Payer: Self-pay

## 2023-03-12 ENCOUNTER — Other Ambulatory Visit (HOSPITAL_COMMUNITY): Payer: Self-pay

## 2023-03-18 ENCOUNTER — Ambulatory Visit (INDEPENDENT_AMBULATORY_CARE_PROVIDER_SITE_OTHER): Payer: 59 | Admitting: Clinical

## 2023-03-18 DIAGNOSIS — F840 Autistic disorder: Secondary | ICD-10-CM | POA: Diagnosis not present

## 2023-03-18 NOTE — Progress Notes (Signed)
Time: 11:00 am-11:55 am CPT Code: 56387F Diagnosis: F84.0  [name] was seen in person for therapy. Session focused on a conversation she had had with her mother about her tendency to go on tangents, which her mother had shared seemed to have worsened. Therapist queried whether this may relate to having fewer social interactions than when she was younger, and [name] agreed that this sounded plausible and that she has felt lonely. Therapist processed this with her. [name] reported that she is looking forward to Thankgiving with her family. She is scheduled to be seen again in two weeks.   Presenting Problem [name] presented seeking CBT to help her manage difficulties with emotion regulation and reframing negative thought patterns that contribute to anxiety and worry. [name] has a diagnosis of Autism Spectrum Disorder (ASD). Symptoms feeling nervous or on edge, difficulty controlling worry, worrying about a variety of things, difficulty relaxing, irritability, fears for the future, feelings of hopelessness, difficulty sleeping, feeling tired, appetite irregularities, poor self-image, passive suicidal ideation without plan or intent (client contracted for safety during initial session) History of Problem  [name] reported that she moved from [place] with her family in July of 2018. Since that time, she has been seen by a provider in the area for challenges with emotion regulation and anxiety related to her diagnosis of autism spectrum disorder. [name] lives with her parents. She has a 35 year-old sister who has struggled with similar issues, as well as a brother who is three years older than she is and lives in [place]. [name] described a long history of therapy and therapeutic programs in order to help her manage these issues, including a partial hospitalization program in December of 2015.  Recent Trigger  [name] reported that she was seeking a change in pace with regard to her therapeutic treatment in order to  ensure continued progress.  Marital and Family Information  Present family concerns/problems: [name] shared several accounts of disputes with her mother, including her mother becoming upset with her for sharing worries with her immediately after her mother has woken up, despite her mother's requests that she not do so.  Strengths/resources in the family/friends: [name] described overall supportive relationships with her parents.  Marital/sexual history patterns: N/A  Family of Origin  Problems in family of origin: None reported.  Family background / ethnic factors: none reported.  No needs/concerns related to ethnicity reported when asked: No  Education/Vocation  Interpersonal concerns/problems: [name] reported that she has difficulties with social interactions related to her ASD diagnosis, and often perseverates about perceived missteps in both the immediate and distant past.  Personal strengths: [name] presented as motivated to improve her situation, and with some insight as to what is effective for her.  Military/work problems/concerns: None noted.  Leisure Activities/Daily Functioning  impaired functioning  Legal Status  No Legal Problems  Medical/Nutritional Concerns  unspecified  Comments: [name] reported that she often perseverates on perceived medical problems, and described herself as a hypochondriac  Religion/Spirituality  Not reported  Other  General Behavior: cooperative  Attire: appropriate  Gait: normal  Motor Activity: normal  Stream of Thought - Productivity: spontaneous  Stream of thought - Progression: normal  Stream of thought - Language: normal  Emotional tone and reactions - Affect: appropriate  Mental trend/Content of thoughts - Perception: normal  Mental trend/Content of thoughts - Orientation: normal  Mental trend/Content of thoughts - Memory: normal  Mental trend/Content of thoughts - General knowledge: consistent with education  Insight: good  Judgment: good  Intelligence: average  Diagnostic Summary  Autism Spectrum Disorder (F84.0)   [facility] Counselor/Therapist Progress Note   Patient ID: Siniyah Oleson, MRN: [id number],     Individualized Treatment Plan Strengths: Enjoys reading, Seeking counseling, In training program through [employer], "I like humor and jokes"  Supports: Her dog, her bestfriend since childhood, her parents, her sister    Goal/Needs for Treatment:  In order of importance to patient 1) Emotional Regulation/Anxiety 2) Improve Positive self talk      Client Statement of Needs: "Find ways of being nicer to myself", cope w/ anxiety and worry    Treatment Level:Outpatient Counseling  Symptoms:Anxiety, Perseverative worry, Negative self talk  Client Treatment Preferences:weekly counseling,  Prefers to be called [name]    Healthcare consumer's goal for treatment:  Dr. Charlyne Mom will support the patient's ability to achieve the goals identified. Cognitive Behavioral Therapy, Supportive Counseling, Mindfulness and Relaxation Strategies and other evidenced-based practices will be used to promote progress towards healthy functioning.    Healthcare consumer will: Actively participate in therapy, working towards healthy functioning.     *Justification for Continuation/Discontinuation of Goal: R=Revised, O=Ongoing, A=Achieved, D=Discontinued   Goal 1) [name] will develop strategies to regulate her emotions and improve her positive self talk Likert rating baseline date 04/12/21: How Easy - 3; How Often - 50% Target Date Goal Was reviewed Status Code Progress towards goal/Likert rating  05/02/23   New Ongoing                      [name] participated in development of tx plan and provided verbal consent.    Chrissie Noa, PhD               Chrissie Noa, PhD

## 2023-03-19 ENCOUNTER — Other Ambulatory Visit (HOSPITAL_COMMUNITY): Payer: Self-pay

## 2023-03-19 ENCOUNTER — Other Ambulatory Visit: Payer: Self-pay

## 2023-03-24 ENCOUNTER — Other Ambulatory Visit (HOSPITAL_COMMUNITY): Payer: Self-pay

## 2023-03-25 ENCOUNTER — Other Ambulatory Visit (HOSPITAL_COMMUNITY): Payer: Self-pay

## 2023-03-26 ENCOUNTER — Other Ambulatory Visit: Payer: Self-pay

## 2023-03-26 ENCOUNTER — Other Ambulatory Visit (HOSPITAL_COMMUNITY): Payer: Self-pay

## 2023-04-01 ENCOUNTER — Ambulatory Visit: Payer: 59 | Admitting: Clinical

## 2023-04-01 DIAGNOSIS — F840 Autistic disorder: Secondary | ICD-10-CM | POA: Diagnosis not present

## 2023-04-01 NOTE — Progress Notes (Signed)
Time: 11:00 am-11:55 am CPT Code: 65784O Diagnosis: F84.0  Jill Alexander was seen in person for therapy. Session focused on events that transpired over Thanksgiving, including a conflict that had arisen between family members. Therapist engaged Jill Alexander in discussion of what steps she could take and where her boundaries might be. She is scheduled to be seen again in one week.  Presenting Problem Jill Alexander presented seeking CBT to help her manage difficulties with emotion regulation and reframing negative thought patterns that contribute to anxiety and worry. Jill Alexander has a diagnosis of Autism Spectrum Disorder (ASD). Symptoms feeling nervous or on edge, difficulty controlling worry, worrying about a variety of things, difficulty relaxing, irritability, fears for the future, feelings of hopelessness, difficulty sleeping, feeling tired, appetite irregularities, poor self-image, passive suicidal ideation without plan or intent (client contracted for safety during initial session) History of Problem  Jill Alexander reported that she moved from [place] with her family in July of 2018. Since that time, she has been seen by a provider in the area for challenges with emotion regulation and anxiety related to her diagnosis of autism spectrum disorder. Jill Alexander lives with her parents. She has a 46 year-old sister who has struggled with similar issues, as well as a brother who is three years older than she is and lives in [place]. Jill Alexander described a long history of therapy and therapeutic programs in order to help her manage these issues, including a partial hospitalization program in December of 2015.  Recent Trigger  Jill Alexander reported that she was seeking a change in pace with regard to her therapeutic treatment in order to ensure continued progress.  Marital and Family Information  Present family concerns/problems: Jill Alexander shared several accounts of disputes with her mother, including her mother becoming upset with her for sharing worries with  her immediately after her mother has woken up, despite her mother's requests that she not do so.  Strengths/resources in the family/friends: Jill Alexander described overall supportive relationships with her parents.  Marital/sexual history patterns: N/A  Family of Origin  Problems in family of origin: None reported.  Family background / ethnic factors: none reported.  No needs/concerns related to ethnicity reported when asked: No  Education/Vocation  Interpersonal concerns/problems: Jill Alexander reported that she has difficulties with social interactions related to her ASD diagnosis, and often perseverates about perceived missteps in both the immediate and distant past.  Personal strengths: Jill Alexander presented as motivated to improve her situation, and with some insight as to what is effective for her.  Military/work problems/concerns: None noted.  Leisure Activities/Daily Functioning  impaired functioning  Legal Status  No Legal Problems  Medical/Nutritional Concerns  unspecified  Comments: Jill Alexander reported that she often perseverates on perceived medical problems, and described herself as a hypochondriac  Religion/Spirituality  Not reported  Other  General Behavior: cooperative  Attire: appropriate  Gait: normal  Motor Activity: normal  Stream of Thought - Productivity: spontaneous  Stream of thought - Progression: normal  Stream of thought - Language: normal  Emotional tone and reactions - Affect: appropriate  Mental trend/Content of thoughts - Perception: normal  Mental trend/Content of thoughts - Orientation: normal  Mental trend/Content of thoughts - Memory: normal  Mental trend/Content of thoughts - General knowledge: consistent with education  Insight: good  Judgment: good  Intelligence: average  Diagnostic Summary  Autism Spectrum Disorder (F84.0)   [facility] Counselor/Therapist Progress Note   Patient ID: Jill Alexander, MRN: [id number],     Individualized  Treatment Plan Strengths: Enjoys reading, Seeking counseling, In  training program through [employer], "I like humor and jokes"  Supports: Her dog, her bestfriend since childhood, her parents, her sister    Goal/Needs for Treatment:  In order of importance to patient 1) Emotional Regulation/Anxiety 2) Improve Positive self talk      Client Statement of Needs: "Find ways of being nicer to myself", cope w/ anxiety and worry    Treatment Level:Outpatient Counseling  Symptoms:Anxiety, Perseverative worry, Negative self talk  Client Treatment Preferences:weekly counseling,  Prefers to be called Jill Alexander    Healthcare consumer's goal for treatment:  Dr. Charlyne Mom will support the patient's ability to achieve the goals identified. Cognitive Behavioral Therapy, Supportive Counseling, Mindfulness and Relaxation Strategies and other evidenced-based practices will be used to promote progress towards healthy functioning.    Healthcare consumer will: Actively participate in therapy, working towards healthy functioning.     *Justification for Continuation/Discontinuation of Goal: R=Revised, O=Ongoing, A=Achieved, D=Discontinued   Goal 1) Jill Alexander will develop strategies to regulate her emotions and improve her positive self talk Likert rating baseline date 04/12/21: How Easy - 3; How Often - 50% Target Date Goal Was reviewed Status Code Progress towards goal/Likert rating  05/02/23   New Ongoing                      Jill Alexander participated in development of tx plan and provided verbal consent.     Chrissie Noa, PhD               Chrissie Noa, PhD

## 2023-04-03 ENCOUNTER — Other Ambulatory Visit (HOSPITAL_COMMUNITY): Payer: Self-pay

## 2023-04-07 ENCOUNTER — Other Ambulatory Visit: Payer: Self-pay

## 2023-04-08 ENCOUNTER — Ambulatory Visit: Payer: 59 | Admitting: Clinical

## 2023-04-08 DIAGNOSIS — F840 Autistic disorder: Secondary | ICD-10-CM | POA: Diagnosis not present

## 2023-04-08 NOTE — Progress Notes (Signed)
Time: 11:00 am-11:58 am CPT Code: 16109U Diagnosis: F84.0  Jill Alexander was seen in person for therapy. She reported that she has an appointment with a nutritionist approaching, and session began by considering how she might communicate challenges and goals to the nutritionist. 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 She is scheduled to be seen again in one week.  Presenting Problem Jill Alexander presented seeking CBT to help her manage difficulties with emotion regulation and reframing negative thought patterns that contribute to anxiety and worry. Jill Alexander has a diagnosis of Autism Spectrum Disorder (ASD). Symptoms feeling nervous or on edge, difficulty controlling worry, worrying about a variety of things, difficulty relaxing, irritability, fears for the future, feelings of hopelessness, difficulty sleeping, feeling tired, appetite irregularities, poor self-image, passive suicidal ideation without plan or intent (client contracted for safety during initial session) History of Problem  Jill Alexander reported that she moved from [place] with her family in July of 2018. Since that time, she has been seen by a provider in the area for challenges with emotion regulation and anxiety related to her diagnosis of autism spectrum disorder. Jill Alexander lives with her parents. She has a 30 year-old sister who has struggled with similar issues, as well as a brother who is three years older than she is and lives in [place]. Jill Alexander described a long history of therapy and therapeutic programs in order to help her manage these issues, including a partial hospitalization program in December of 2015.  Recent Trigger  Jill Alexander reported that she was seeking a change in pace with regard to her therapeutic treatment in order to ensure continued progress.  Marital and Family Information  Present family concerns/problems: Jill Alexander shared several accounts of disputes with her mother, including her mother becoming  upset with her for sharing worries with her immediately after her mother has woken up, despite her mother's requests that she not do so.  Strengths/resources in the family/friends: Jill Alexander described overall supportive relationships with her parents.  Marital/sexual history patterns: N/A  Family of Origin  Problems in family of origin: None reported.  Family background / ethnic factors: none reported.  No needs/concerns related to ethnicity reported when asked: No  Education/Vocation  Interpersonal concerns/problems: Jill Alexander reported that she has difficulties with social interactions related to her ASD diagnosis, and often perseverates about perceived missteps in both the immediate and distant past.  Personal strengths: Jill Alexander presented as motivated to improve her situation, and with some insight as to what is effective for her.  Military/work problems/concerns: None noted.  Leisure Activities/Daily Functioning  impaired functioning  Legal Status  No Legal Problems  Medical/Nutritional Concerns  unspecified  Comments: Jill Alexander reported that she often perseverates on perceived medical problems, and described herself as a hypochondriac  Religion/Spirituality  Not reported  Other  General Behavior: cooperative  Attire: appropriate  Gait: normal  Motor Activity: normal  Stream of Thought - Productivity: spontaneous  Stream of thought - Progression: normal  Stream of thought - Language: normal  Emotional tone and reactions - Affect: appropriate  Mental trend/Content of thoughts - Perception: normal  Mental trend/Content of thoughts - Orientation: normal  Mental trend/Content of thoughts - Memory: normal  Mental trend/Content of thoughts - General knowledge: consistent with education  Insight: good  Judgment: good  Intelligence: average  Diagnostic Summary  Autism Spectrum Disorder (F84.0)   [facility] Counselor/Therapist Progress Note   Patient ID: Jill Alexander, MRN:  [id number],     Individualized  Treatment Plan Strengths: Enjoys reading, Seeking counseling, In training program through [employer], "I like humor and jokes"  Supports: Her dog, her bestfriend since childhood, her parents, her sister    Goal/Needs for Treatment:  In order of importance to patient 1) Emotional Regulation/Anxiety 2) Improve Positive self talk      Client Statement of Needs: "Find ways of being nicer to myself", cope w/ anxiety and worry    Treatment Level:Outpatient Counseling  Symptoms:Anxiety, Perseverative worry, Negative self talk  Client Treatment Preferences:weekly counseling,  Prefers to be called Jill Alexander    Healthcare consumer's goal for treatment:  Dr. Charlyne Mom will support the patient's ability to achieve the goals identified. Cognitive Behavioral Therapy, Supportive Counseling, Mindfulness and Relaxation Strategies and other evidenced-based practices will be used to promote progress towards healthy functioning.    Healthcare consumer will: Actively participate in therapy, working towards healthy functioning.     *Justification for Continuation/Discontinuation of Goal: R=Revised, O=Ongoing, A=Achieved, D=Discontinued   Goal 1) Jill Alexander will develop strategies to regulate her emotions and improve her positive self talk Likert rating baseline date 04/12/21: How Easy - 3; How Often - 50% Target Date Goal Was reviewed Status Code Progress towards goal/Likert rating  05/02/23   New Ongoing                      Jill Alexander participated in development of tx plan and provided verbal consent.                 Chrissie Noa, PhD               Chrissie Noa, PhD

## 2023-04-09 ENCOUNTER — Other Ambulatory Visit: Payer: Self-pay

## 2023-04-09 ENCOUNTER — Other Ambulatory Visit (HOSPITAL_COMMUNITY): Payer: Self-pay

## 2023-04-11 ENCOUNTER — Other Ambulatory Visit: Payer: Self-pay

## 2023-04-11 ENCOUNTER — Other Ambulatory Visit (HOSPITAL_COMMUNITY): Payer: Self-pay

## 2023-04-14 ENCOUNTER — Encounter: Payer: 59 | Attending: Gastroenterology | Admitting: Dietician

## 2023-04-14 DIAGNOSIS — K219 Gastro-esophageal reflux disease without esophagitis: Secondary | ICD-10-CM | POA: Diagnosis not present

## 2023-04-14 NOTE — Progress Notes (Signed)
Medical Nutrition Therapy  Appointment Start time:  1520  Appointment End time:  [phone]  Primary concerns today: continuous issues with anxiety around food choices Referral diagnosis: Gastroesophageal reflux disease, unspecified whether esophagitis present  Preferred learning style:   no preference indicated Learning readiness:  not ready-contemplating   NUTRITION ASSESSMENT   Clinical Medical Hx:  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    Medications:  Current Outpatient Medications:    Docusate Sodium (COLACE PO), Take by mouth., Disp: , Rfl:    melatonin 1 MG TABS tablet, Take 1 tablet (1 mg total) by mouth at bedtime as needed (insomnia)., Disp: 30 tablet, Rfl: 0   Multiple Vitamins-Minerals (MULTIVITAMIN PO), Take 1 tablet by mouth daily., Disp: , Rfl:    pantoprazole (PROTONIX) 40 MG tablet, Take 1 tablet (40 mg total) by mouth daily., Disp: 30 tablet, Rfl: 3   polyethylene glycol (MIRALAX) packet, Take 17 g by mouth daily as needed., Disp: 14 each, Rfl: 0   acetaminophen (TYLENOL) 500 MG tablet, Take 500 mg by mouth every 6 (six) hours as needed., Disp: , Rfl:    atomoxetine (STRATTERA) 25 MG capsule, Take 1 capsule (25 mg total) by mouth daily., Disp: 30 capsule, Rfl: 1   atomoxetine (STRATTERA) 40 MG capsule, Take 1 capsule (40 mg total) by mouth daily., Disp: 30 capsule, Rfl: 1   [place] Oil-Levomenthol (FDGARD) 25-20.75 MG CAPS, Take as needed, Disp: , Rfl:    desvenlafaxine (PRISTIQ) 50 MG 24 hr tablet, Take one tablet daily, Disp: 30 tablet, Rfl: 1   Dextromethorphan-buPROPion ER (AUVELITY) 45-105 MG TBCR, Take 1 tablet by mouth 2 (two) times daily., Disp: 60 tablet, Rfl: 2   Dextromethorphan-buPROPion ER (AUVELITY) 45-105 MG TBCR,  Take 1 tablet by mouth 2 (two) times daily. (Patient not taking: Reported on 02/13/2023), Disp: 180 tablet, Rfl: 1   Dextromethorphan-buPROPion ER (AUVELITY) 45-105 MG TBCR, Take 1 tablet by mouth 2 (two) times daily. (Patient not taking: Reported on 02/13/2023), Disp: 60 tablet, Rfl: 2   Dextromethorphan-buPROPion ER (AUVELITY) 45-105 MG TBCR, Take 1 tablet by mouth 2 (two) times daily. (Patient not taking: Reported on 02/13/2023), Disp: 60 tablet, Rfl: 2   drospirenone-ethinyl estradiol (YAZ) 3-0.02 MG tablet, Take 1 tablet by mouth daily., Disp: 28 tablet, Rfl: 11   Famotidine (PEPCID PO), Take by mouth., Disp: , Rfl:    Fremanezumab-vfrm (AJOVY) 225 MG/1.5ML SOAJ, Inject 225 mg into the skin every 30 (thirty) days. (Patient not taking: Reported on 02/13/2023), Disp: 1.5 mL, Rfl: 11   Ibuprofen (MOTRIN PO), Take 200 mg by mouth as needed., Disp: , Rfl:    lamoTRIgine (LAMICTAL) 200 MG tablet, Take two tablets bu mouth daily, Disp: 180 tablet, Rfl: 1   LORazepam (ATIVAN) 1 MG tablet, Take 1 tablet (1 mg total) by mouth 2 (two) times daily as needed for severe anxiety (Patient not taking: Reported on 02/13/2023), Disp: 60 tablet, Rfl: 1   LORazepam (ATIVAN) 1 MG tablet, Take 1 tablet (1 mg total) by mouth 2 (two) times daily as needed for severe anxiety., Disp: 60 tablet, Rfl: 1   lurasidone (LATUDA) 40 MG TABS tablet, Take 1 tablet (40 mg total) by mouth  daily with breakfast., Disp: 90 tablet, Rfl: 0   Lurasidone HCl 60 MG TABS, Take 1 tablet (60 mg total) by mouth every evening with food. (Patient not taking: Reported on 02/13/2023), Disp: 30 tablet, Rfl: 1   Lurasidone HCl 60 MG TABS, Take 1 tablet (60 mg total) by mouth every evening with food., Disp: 90 tablet, Rfl: 1   mirabegron ER (MYRBETRIQ) 50 MG TB24 tablet, Take 1 tablet (50 mg total) by mouth daily., Disp: 30 tablet, Rfl: 3   ofloxacin (FLOXIN) 0.3 % OTIC solution, Place 5 drops into both ears daily for 7 days, Disp: 5 mL, Rfl: 0    rizatriptan (MAXALT) 10 MG tablet, Take 1 tablet (10 mg total) by mouth as needed for migraine. May repeat in 2 hours if needed., Disp: 10 tablet, Rfl: 11   topiramate (TOPAMAX) 50 MG tablet, Take 3 tablets (150 mg total) by mouth daily., Disp: 270 tablet, Rfl: 0   viloxazine ER (QELBREE) 200 MG 24 hr capsule, Take 1 capsule (200 mg total) by mouth in the morning., Disp: 30 capsule, Rfl: 1   Labs: No results found for: "CHOL", "HDL", "LDLCALC", "LDLDIRECT", "TRIG", "CHOLHDL" Lab Results  Component Value Date   HGBA1C 5.2 07/14/2019   BP Readings from Last 3 Encounters:  02/13/23 98/70  01/07/23 110/72  06/17/22 (!) 101/53    Lifestyle & Dietary Hx Pt present today alone. Pt reports she lives with her parents. Pt reports she is lactose intolerant and reports purchasing almond or oat milk. Pt reports she loves bread and pasta and finds her self selecting gluten containing foods resulting in stomach pain at times. Pt reports she see therapy and psychiatry monthly. Pt states she has been provided a list of foods to avoid to improve complains of acid reflux.  Pt reports noticing the following burritos, [place] sauces increase stomach pain. Pt reports her current whole wheat bread does not hurt my stomach. Pt reports she avoids beef and pork for personal preference. All Pt's questions were answered during this encounter.    Estimated daily fluid intake: 36 oz + Supplements: magnesium daily  Sleep: 9 hours nightly  Stress / self-care: 9 out of 10/ play with dog and kitten, reading, listening pod cast, walking  Current average weekly physical activity: walks once weekly, enjoys gym occasionally   24-Hr Dietary Recall First Meal: 2 slices of whole grain toast with peanut butter, water, coffee with oat milk or almond milk  Snack: none Second Meal: 12-2p: protein bar or skips alot Snack: banana with yogurt or magic spoon cereal, oat milk or instant kodiak oatmeal  Third Meal: flour or  corn tortilla with beans, rice, cheese, chips and salsa, guacamole, spring mix or pasta with red sauce, veggie meatball, salad with dressing  Snack: cookie Beverages: water, coffee with oat milk or almond milk     NUTRITION DIAGNOSIS  NB-1.1 Food and nutrition-related knowledge deficit As related to no prior nutrition related education.  As evidenced by Pt report and dietary recall.   NUTRITION INTERVENTION  Nutrition education (E-1) on the following topics:  Fruits & Vegetables: Aim to fill half your plate with a variety of fruits and vegetables. They are rich in vitamins, minerals, and fiber, and can help reduce the risk of chronic diseases. Choose a colorful assortment of fruits and vegetables to ensure you get a wide range of nutrients. Grains and Starches: Make at least half of your grain choices whole grains, such as brown rice, whole wheat  bread, and oats. Whole grains provide fiber, which aids in digestion and healthy cholesterol levels. Aim for whole forms of starchy vegetables such as potatoes, sweet potatoes, beans, peas, and corn, which are fiber rich and provide many vitamins and minerals.  Protein: Incorporate lean sources of protein, such as poultry, fish, beans, nuts, and seeds, into your meals. Protein is essential for building and repairing tissues, staying full, balancing blood sugar, as well as supporting immune function. Dairy: Include low-fat or fat-free dairy products like milk, yogurt, and cheese in your diet. Dairy foods are excellent sources of calcium and vitamin D, which are crucial for bone health.  Physical Activity: Aim for 60 minutes of physical activity daily. Regular physical activity promotes overall health-including helping to reduce risk for heart disease and diabetes, promoting mental health, and helping [place] sleep better.     Handouts Provided Include  GERD MNT Gluten free MNT  Learning Style & Readiness for Change Teaching method utilized: Visual &  Auditory  Demonstrated degree of understanding via: Teach Back  Barriers to learning/adherence to lifestyle change: unknown  Goals Established by Pt Keep a food journal to determine which foods increase symptoms   MONITORING & EVALUATION Dietary intake, weekly physical activity, and food journal  Next Steps  Patient is to return PRN.

## 2023-04-15 ENCOUNTER — Other Ambulatory Visit (HOSPITAL_COMMUNITY): Payer: Self-pay

## 2023-04-15 ENCOUNTER — Ambulatory Visit: Payer: 59 | Admitting: Clinical

## 2023-04-15 ENCOUNTER — Other Ambulatory Visit: Payer: Self-pay

## 2023-04-15 DIAGNOSIS — F840 Autistic disorder: Secondary | ICD-10-CM

## 2023-04-15 DIAGNOSIS — F411 Generalized anxiety disorder: Secondary | ICD-10-CM | POA: Diagnosis not present

## 2023-04-15 DIAGNOSIS — F331 Major depressive disorder, recurrent, moderate: Secondary | ICD-10-CM | POA: Diagnosis not present

## 2023-04-15 MED ORDER — AUVELITY 45-105 MG PO TBCR
1.0000 | EXTENDED_RELEASE_TABLET | Freq: Two times a day (BID) | ORAL | 5 refills | Status: DC
  Filled 2023-04-15 – 2023-06-03 (×3): qty 60, 30d supply, fill #0
  Filled 2023-07-07: qty 60, 30d supply, fill #1
  Filled 2023-10-13 – 2023-10-16 (×3): qty 60, 30d supply, fill #2
  Filled 2023-11-30: qty 60, 30d supply, fill #3

## 2023-04-15 MED ORDER — DESVENLAFAXINE SUCCINATE ER 100 MG PO TB24
100.0000 mg | ORAL_TABLET | Freq: Every day | ORAL | 1 refills | Status: DC
  Filled 2023-04-15: qty 30, 30d supply, fill #0
  Filled 2023-05-06 – 2023-05-14 (×2): qty 30, 30d supply, fill #1

## 2023-04-15 NOTE — Progress Notes (Signed)
Time: 11:00 am-11:58 am CPT Code: 16109U Diagnosis: F84.0  Jill Alexander was seen in person for therapy. Session began by creating a coping plan for over the holiday break. She actively participated and was able to identify over 10 strategies, including calling friends and family and accessing online support groups. She was provided with a written list. Session also included discussion of her eating struggles. Therapist suggested trying to eat on a regular schedule, rather than waiting until she feels hungry. She is scheduled to be seen on 1/7.   Presenting Problem Jill Alexander presented seeking CBT to help her manage difficulties with emotion regulation and reframing negative thought patterns that contribute to anxiety and worry. Jill Alexander has a diagnosis of Autism Spectrum Disorder (ASD). Symptoms feeling nervous or on edge, difficulty controlling worry, worrying about a variety of things, difficulty relaxing, irritability, fears for the future, feelings of hopelessness, difficulty sleeping, feeling tired, appetite irregularities, poor self-image, passive suicidal ideation without plan or intent (client contracted for safety during initial session) History of Problem  Jill Alexander reported that she moved from [place] with her family in July of 2018. Since that time, she has been seen by a provider in the area for challenges with emotion regulation and anxiety related to her diagnosis of autism spectrum disorder. Jill Alexander lives with her parents. She has a 29 year-old sister who has struggled with similar issues, as well as a brother who is three years older than she is and lives in [place]. Jill Alexander described a long history of therapy and therapeutic programs in order to help her manage these issues, including a partial hospitalization program in December of 2015.  Recent Trigger  Jill Alexander reported that she was seeking a change in pace with regard to her therapeutic treatment in order to ensure continued progress.  Marital and Family  Information  Present family concerns/problems: Jill Alexander shared several accounts of disputes with her mother, including her mother becoming upset with her for sharing worries with her immediately after her mother has woken up, despite her mother's requests that she not do so.  Strengths/resources in the family/friends: Jill Alexander described overall supportive relationships with her parents.  Marital/sexual history patterns: N/A  Family of Origin  Problems in family of origin: None reported.  Family background / ethnic factors: none reported.  No needs/concerns related to ethnicity reported when asked: No  Education/Vocation  Interpersonal concerns/problems: Jill Alexander reported that she has difficulties with social interactions related to her ASD diagnosis, and often perseverates about perceived missteps in both the immediate and distant past.  Personal strengths: Jill Alexander presented as motivated to improve her situation, and with some insight as to what is effective for her.  Military/work problems/concerns: None noted.  Leisure Activities/Daily Functioning  impaired functioning  Legal Status  No Legal Problems  Medical/Nutritional Concerns  unspecified  Comments: Jill Alexander reported that she often perseverates on perceived medical problems, and described herself as a hypochondriac  Religion/Spirituality  Not reported  Other  General Behavior: cooperative  Attire: appropriate  Gait: normal  Motor Activity: normal  Stream of Thought - Productivity: spontaneous  Stream of thought - Progression: normal  Stream of thought - Language: normal  Emotional tone and reactions - Affect: appropriate  Mental trend/Content of thoughts - Perception: normal  Mental trend/Content of thoughts - Orientation: normal  Mental trend/Content of thoughts - Memory: normal  Mental trend/Content of thoughts - General knowledge: consistent with education  Insight: good  Judgment: good  Intelligence: average  Diagnostic Summary   Autism Spectrum Disorder (F84.0)  [facility] Counselor/Therapist Progress Note   Patient ID: Jill Alexander, MRN: [id number],     Individualized Treatment Plan Strengths: Enjoys reading, Seeking counseling, In training program through [employer], "I like humor and jokes"  Supports: Her dog, her bestfriend since childhood, her parents, her sister    Goal/Needs for Treatment:  In order of importance to patient 1) Emotional Regulation/Anxiety 2) Improve Positive self talk      Client Statement of Needs: "Find ways of being nicer to myself", cope w/ anxiety and worry    Treatment Level:Outpatient Counseling  Symptoms:Anxiety, Perseverative worry, Negative self talk  Client Treatment Preferences:weekly counseling,  Prefers to be called Jill Alexander    Healthcare consumer's goal for treatment:  Dr. Charlyne Mom will support the patient's ability to achieve the goals identified. Cognitive Behavioral Therapy, Supportive Counseling, Mindfulness and Relaxation Strategies and other evidenced-based practices will be used to promote progress towards healthy functioning.    Healthcare consumer will: Actively participate in therapy, working towards healthy functioning.     *Justification for Continuation/Discontinuation of Goal: R=Revised, O=Ongoing, A=Achieved, D=Discontinued   Goal 1) Jill Alexander will develop strategies to regulate her emotions and improve her positive self talk Likert rating baseline date 04/12/21: How Easy - 3; How Often - 50% Target Date Goal Was reviewed Status Code Progress towards goal/Likert rating  05/02/23   New Ongoing                      Jill Alexander participated in development of tx plan and provided verbal consent.                Chrissie Noa, PhD               Chrissie Noa, PhD

## 2023-04-24 ENCOUNTER — Ambulatory Visit: Payer: 59 | Admitting: Neurology

## 2023-05-06 ENCOUNTER — Ambulatory Visit: Payer: 59 | Admitting: Clinical

## 2023-05-06 ENCOUNTER — Other Ambulatory Visit: Payer: Self-pay

## 2023-05-06 ENCOUNTER — Other Ambulatory Visit (HOSPITAL_COMMUNITY): Payer: Self-pay

## 2023-05-06 DIAGNOSIS — F840 Autistic disorder: Secondary | ICD-10-CM

## 2023-05-06 NOTE — Progress Notes (Addendum)
 Time: 11:00 am-11:58 am CPT Code: 09208E Diagnosis: F84.0  [name] was seen in person for therapy. Session began by reviewing and updating her treatment plan. [name] participated actively in updating goals for therapy and considering appropriate interventions. She reported feeling down over the break, and the remainder of session focused on unpacking a series of events at work that had led to [name]'s shift being moved. She is scheduled to be seen again in one week.  Intake Presenting Problem [name] presented seeking CBT to help her manage difficulties with emotion regulation and reframing negative thought patterns that contribute to anxiety and worry. [name] has a diagnosis of Autism Spectrum Disorder (ASD). Symptoms feeling nervous or on edge, difficulty controlling worry, worrying about a variety of things, difficulty relaxing, irritability, fears for the future, feelings of hopelessness, difficulty sleeping, feeling tired, appetite irregularities, poor self-image, passive suicidal ideation without plan or intent (client contracted for safety during initial session) History of Problem  [name] reported that she moved from [place]  with her family in July of 2018. Since that time, she has been seen by a provider in the area for challenges with emotion regulation and anxiety related to her diagnosis of autism spectrum disorder. [name] lives with her parents. She has a 23 year-old sister who has struggled with similar issues, as well as a brother who is three years older than she is and lives in [place]. [name] described a long history of therapy and therapeutic programs in order to help her manage these issues, including a partial hospitalization program in December of 2015.  Recent Trigger  [name] reported that she was seeking a change in pace with regard to her therapeutic treatment in order to ensure continued progress.  Marital and Family Information  Present family concerns/problems: [name] shared several  accounts of disputes with her mother, including her mother becoming upset with her for sharing worries with her immediately after her mother has woken up, despite her mother's requests that she not do so.  Strengths/resources in the family/friends: [name] described overall supportive relationships with her parents.  Marital/sexual history patterns: N/A  Family of Origin  Problems in family of origin: None reported.  Family background / ethnic factors: none reported.  No needs/concerns related to ethnicity reported when asked: No  Education/Vocation  Interpersonal concerns/problems: [name] reported that she has difficulties with social interactions related to her ASD diagnosis, and often perseverates about perceived missteps in both the immediate and distant past.  Personal strengths: [name] presented as motivated to improve her situation, and with some insight as to what is effective for her.  Military/work problems/concerns: None noted.  Leisure Activities/Daily Functioning  impaired functioning  Legal Status  No Legal Problems  Medical/Nutritional Concerns  unspecified  Comments: [name] reported that she often perseverates on perceived medical problems, and described herself as a hypochondriac  Religion/Spirituality  Not reported  Other  General Behavior: cooperative  Attire: appropriate  Gait: normal  Motor Activity: normal  Stream of Thought - Productivity: spontaneous  Stream of thought - Progression: normal  Stream of thought - Language: normal  Emotional tone and reactions - Affect: appropriate  Mental trend/Content of thoughts - Perception: normal  Mental trend/Content of thoughts - Orientation: normal  Mental trend/Content of thoughts - Memory: normal  Mental trend/Content of thoughts - General knowledge: consistent with education  Insight: good  Judgment: good  Intelligence: average  Diagnostic Summary  Autism Spectrum Disorder (F84.0)   [facility]  Counselor/Therapist Progress Note   Patient ID: Mayzee  Hugh Garrow, MRN: [id number],     Individualized Treatment Plan Strengths: Enjoys reading, Seeking counseling, In training program through [employer], I like humor and jokes  Supports: Her dog, her bestfriend since childhood, her parents, her sister    Goal/Needs for Treatment:  In order of importance to patient 1) Emotional Regulation/Anxiety 2) Improve Positive self talk      Client Statement of Needs: Find ways of being nicer to myself, cope w/ anxiety and worry    Treatment Level:Outpatient Counseling  Symptoms:Anxiety, Perseverative worry, Negative self talk  Client Treatment Preferences:weekly counseling,  Prefers to be called [name]    Healthcare consumer's goal for treatment:  Dr. Teniya Filter will support the patient's ability to achieve the goals identified. Cognitive Behavioral Therapy, Supportive Counseling, Mindfulness and Relaxation Strategies and other evidenced-based practices will be used to promote progress towards healthy functioning.    Healthcare consumer will: Actively participate in therapy, working towards healthy functioning.     *Justification for Continuation/Discontinuation of Goal: R=Revised, O=Ongoing, A=Achieved, D=Discontinued   Goal 1) [name] will develop strategies to regulate her emotions and improve her positive self talk   Objective: [name] will incorporate her coping skills into her routine and use them more effectively and consistently   Likert rating baseline date 04/12/21: How Easy - 3; How Often - 50% Target Date Goal Was reviewed Status Code Progress towards goal/Likert rating  05/05/2024  05/06/2023 updated Ongoing                      Goal 2: [name] will improve communication with others, especially close family members, by working on her tendency to assume the worst.   Target Date: 05/05/2024  Interventions: Therapist will help [name] to notice and disengage from maladaptive  thoughts and behaviors using CBT based strategies [name] will have opportunities to process her experiences in session Therapist will engage [name] in discussion of coping strategies and creation of a plan to use them regularly Therapist will engage [name] in regular mood checks to continue to monitor her general mental health, prioritizing this at the start of the session to ensure it is covered   Therapist will provide additional resources as appropriate   [name] participated in development of tx plan and provided verbal consent.                         Kassim Guertin L Hollyann Pablo, PhD               Andriette LITTIE Ponto, PhD

## 2023-05-07 ENCOUNTER — Encounter (HOSPITAL_COMMUNITY): Payer: Self-pay

## 2023-05-07 ENCOUNTER — Other Ambulatory Visit (HOSPITAL_COMMUNITY): Payer: Self-pay

## 2023-05-07 MED ORDER — LAMOTRIGINE 200 MG PO TABS
400.0000 mg | ORAL_TABLET | Freq: Every day | ORAL | 1 refills | Status: DC
  Filled 2023-05-07 – 2023-06-19 (×2): qty 180, 90d supply, fill #0
  Filled 2023-09-16: qty 180, 90d supply, fill #1

## 2023-05-07 MED ORDER — TOPIRAMATE 50 MG PO TABS
150.0000 mg | ORAL_TABLET | Freq: Every day | ORAL | 0 refills | Status: DC
  Filled 2023-05-07: qty 270, 90d supply, fill #0

## 2023-05-07 MED ORDER — MIRABEGRON ER 50 MG PO TB24
50.0000 mg | ORAL_TABLET | Freq: Every day | ORAL | 3 refills | Status: AC
  Filled 2023-05-07 – 2023-05-20 (×2): qty 30, 30d supply, fill #0
  Filled 2023-05-30 – 2023-06-14 (×2): qty 30, 30d supply, fill #1
  Filled 2023-07-15: qty 30, 30d supply, fill #2
  Filled 2023-08-19: qty 30, 30d supply, fill #3

## 2023-05-13 ENCOUNTER — Ambulatory Visit (INDEPENDENT_AMBULATORY_CARE_PROVIDER_SITE_OTHER): Payer: Commercial Managed Care - PPO | Admitting: Clinical

## 2023-05-13 DIAGNOSIS — F840 Autistic disorder: Secondary | ICD-10-CM | POA: Diagnosis not present

## 2023-05-13 NOTE — Progress Notes (Signed)
 Time: 11:00 am-11:58 am CPT Code: 09162E Diagnosis: F84.0  [name] was seen in person for therapy. Session focused on continuing to review events that had transpired leading up to [name]'s hours having been reduced at work. She also requested to review DBT skills, especially as relates to interpersonal boundary setting. Therapist engaged her in review of the skill of mindful attention. For homework, she will practice paying mindful attention, especially when she has the urge to process her thoughts aloud to her mother. She is scheduled to be seen again in one week.  Intake Presenting Problem [name] presented seeking CBT to help her manage difficulties with emotion regulation and reframing negative thought patterns that contribute to anxiety and worry. [name] has a diagnosis of Autism Spectrum Disorder (ASD). Symptoms feeling nervous or on edge, difficulty controlling worry, worrying about a variety of things, difficulty relaxing, irritability, fears for the future, feelings of hopelessness, difficulty sleeping, feeling tired, appetite irregularities, poor self-image, passive suicidal ideation without plan or intent (client contracted for safety during initial session) History of Problem  [name] reported that she moved from [place]  with her family in July of 2018. Since that time, she has been seen by a provider in the area for challenges with emotion regulation and anxiety related to her diagnosis of autism spectrum disorder. [name] lives with her parents. She has a 84 year-old sister who has struggled with similar issues, as well as a brother who is three years older than she is and lives in [place]. [name] described a long history of therapy and therapeutic programs in order to help her manage these issues, including a partial hospitalization program in December of 2015.  Recent Trigger  [name] reported that she was seeking a change in pace with regard to her therapeutic treatment in order to ensure  continued progress.  Marital and Family Information  Present family concerns/problems: [name] shared several accounts of disputes with her mother, including her mother becoming upset with her for sharing worries with her immediately after her mother has woken up, despite her mother's requests that she not do so.  Strengths/resources in the family/friends: [name] described overall supportive relationships with her parents.  Marital/sexual history patterns: N/A  Family of Origin  Problems in family of origin: None reported.  Family background / ethnic factors: none reported.  No needs/concerns related to ethnicity reported when asked: No  Education/Vocation  Interpersonal concerns/problems: [name] reported that she has difficulties with social interactions related to her ASD diagnosis, and often perseverates about perceived missteps in both the immediate and distant past.  Personal strengths: [name] presented as motivated to improve her situation, and with some insight as to what is effective for her.  Military/work problems/concerns: None noted.  Leisure Activities/Daily Functioning  impaired functioning  Legal Status  No Legal Problems  Medical/Nutritional Concerns  unspecified  Comments: [name] reported that she often perseverates on perceived medical problems, and described herself as a hypochondriac  Religion/Spirituality  Not reported  Other  General Behavior: cooperative  Attire: appropriate  Gait: normal  Motor Activity: normal  Stream of Thought - Productivity: spontaneous  Stream of thought - Progression: normal  Stream of thought - Language: normal  Emotional tone and reactions - Affect: appropriate  Mental trend/Content of thoughts - Perception: normal  Mental trend/Content of thoughts - Orientation: normal  Mental trend/Content of thoughts - Memory: normal  Mental trend/Content of thoughts - General knowledge: consistent with education  Insight: good  Judgment: good   Intelligence: average  Diagnostic Summary  Autism Spectrum Disorder (F84.0)   [facility] Counselor/Therapist Progress Note   Patient ID: Kaiana Marion, MRN: [id number],     Individualized Treatment Plan Strengths: Enjoys reading, Seeking counseling, In training program through [employer], I like humor and jokes  Supports: Her dog, her bestfriend since childhood, her parents, her sister    Goal/Needs for Treatment:  In order of importance to patient 1) Emotional Regulation/Anxiety 2) Improve Positive self talk      Client Statement of Needs: Find ways of being nicer to myself, cope w/ anxiety and worry    Treatment Level:Outpatient Counseling  Symptoms:Anxiety, Perseverative worry, Negative self talk  Client Treatment Preferences:weekly counseling,  Prefers to be called [name]    Healthcare consumer's goal for treatment:  Dr. Gwendlyn Hanback will support the patient's ability to achieve the goals identified. Cognitive Behavioral Therapy, Supportive Counseling, Mindfulness and Relaxation Strategies and other evidenced-based practices will be used to promote progress towards healthy functioning.    Healthcare consumer will: Actively participate in therapy, working towards healthy functioning.     *Justification for Continuation/Discontinuation of Goal: R=Revised, O=Ongoing, A=Achieved, D=Discontinued   Goal 1) [name] will develop strategies to regulate her emotions and improve her positive self talk   Objective: [name] will incorporate her coping skills into her routine and use them more effectively and consistently   Likert rating baseline date 04/12/21: How Easy - 3; How Often - 50% Target Date Goal Was reviewed Status Code Progress towards goal/Likert rating  05/05/2024  05/06/2023 updated Ongoing                      Goal 2: [name] will improve communication with others, especially close family members, by working on her tendency to assume the worst.    Target Date: 05/05/2024  Interventions: Therapist will help [name] to notice and disengage from maladaptive thoughts and behaviors using CBT based strategies [name] will have opportunities to process her experiences in session Therapist will engage [name] in discussion of coping strategies and creation of a plan to use them regularly Therapist will engage [name] in regular mood checks to continue to monitor her general mental health, prioritizing this at the start of the session to ensure it is covered   Therapist will provide additional resources as appropriate   [name] participated in development of tx plan and provided verbal consent.                                   Angi Goodell L Anyela Napierkowski, PhD               Andriette LITTIE Ponto, PhD

## 2023-05-19 ENCOUNTER — Other Ambulatory Visit (HOSPITAL_COMMUNITY): Payer: Self-pay

## 2023-05-20 ENCOUNTER — Ambulatory Visit: Payer: Commercial Managed Care - PPO | Admitting: Clinical

## 2023-05-20 ENCOUNTER — Other Ambulatory Visit (HOSPITAL_COMMUNITY): Payer: Self-pay

## 2023-05-20 DIAGNOSIS — F840 Autistic disorder: Secondary | ICD-10-CM | POA: Diagnosis not present

## 2023-05-20 NOTE — Progress Notes (Signed)
Time: 11:00 am-11:58 am CPT Code: 40102V Diagnosis: F84.0  Jill Alexander was seen in person for therapy. She had reached out to the therapist via secure message about continued efforts for guidance with anxiety around eating, and had been unsuccessful in establishing care with [facility] (who is out of network with her insurance). Therapist provided contact information for [facility]. Session included discussion of boundary setting and respecting, as well as giving the benefit of the doubt and trusting that family members will tell her if they are upset with her. She is scheduled to be seen again in one week, and therapist offered several openings if she would like to be seen sooner.  Intake Presenting Problem Jill Alexander presented seeking CBT to help her manage difficulties with emotion regulation and reframing negative thought patterns that contribute to anxiety and worry. Jill Alexander has a diagnosis of Autism Spectrum Disorder (ASD). Symptoms feeling nervous or on edge, difficulty controlling worry, worrying about a variety of things, difficulty relaxing, irritability, fears for the future, feelings of hopelessness, difficulty sleeping, feeling tired, appetite irregularities, poor self-image, passive suicidal ideation without plan or intent (client contracted for safety during initial session) History of Problem  Jill Alexander reported that she moved from [place] with her family in July of 2018. Since that time, she has been seen by a provider in the area for challenges with emotion regulation and anxiety related to her diagnosis of autism spectrum disorder. Jill Alexander lives with her parents. She has a 46 year-old sister who has struggled with similar issues, as well as a brother who is three years older than she is and lives in [place]. Jill Alexander described a long history of therapy and therapeutic programs in order to help her manage these issues, including a partial hospitalization program in December of 2015.  Recent Trigger  Jill Alexander  reported that she was seeking a change in pace with regard to her therapeutic treatment in order to ensure continued progress.  Marital and Family Information  Present family concerns/problems: Jill Alexander shared several accounts of disputes with her mother, including her mother becoming upset with her for sharing worries with her immediately after her mother has woken up, despite her mother's requests that she not do so.  Strengths/resources in the family/friends: Jill Alexander described overall supportive relationships with her parents.  Marital/sexual history patterns: N/A  Family of Origin  Problems in family of origin: None reported.  Family background / ethnic factors: none reported.  No needs/concerns related to ethnicity reported when asked: No  Education/Vocation  Interpersonal concerns/problems: Jill Alexander reported that she has difficulties with social interactions related to her ASD diagnosis, and often perseverates about perceived missteps in both the immediate and distant past.  Personal strengths: Jill Alexander presented as motivated to improve her situation, and with some insight as to what is effective for her.  Military/work problems/concerns: None noted.  Leisure Activities/Daily Functioning  impaired functioning  Legal Status  No Legal Problems  Medical/Nutritional Concerns  unspecified  Comments: Jill Alexander reported that she often perseverates on perceived medical problems, and described herself as a hypochondriac  Religion/Spirituality  Not reported  Other  General Behavior: cooperative  Attire: appropriate  Gait: normal  Motor Activity: normal  Stream of Thought - Productivity: spontaneous  Stream of thought - Progression: normal  Stream of thought - Language: normal  Emotional tone and reactions - Affect: appropriate  Mental trend/Content of thoughts - Perception: normal  Mental trend/Content of thoughts - Orientation: normal  Mental trend/Content of thoughts - Memory: normal  Mental  trend/Content  of thoughts - General knowledge: consistent with education  Insight: good  Judgment: good  Intelligence: average  Diagnostic Summary  Autism Spectrum Disorder (F84.0)    [facility] Counselor/Therapist Progress Note   Patient ID: Jill Alexander, MRN: [id number],     Individualized Treatment Plan Strengths: Enjoys reading, Seeking counseling, In training program through [employer], "I like humor and jokes"  Supports: Her dog, her bestfriend since childhood, her parents, her sister    Goal/Needs for Treatment:  In order of importance to patient 1) Emotional Regulation/Anxiety 2) Improve Positive self talk      Client Statement of Needs: "Find ways of being nicer to myself", cope w/ anxiety and worry    Treatment Level:Outpatient Counseling  Symptoms:Anxiety, Perseverative worry, Negative self talk  Client Treatment Preferences:weekly counseling,  Prefers to be called Jill Alexander    Healthcare consumer's goal for treatment:  Dr. Charlyne Mom will support the patient's ability to achieve the goals identified. Cognitive Behavioral Therapy, Supportive Counseling, Mindfulness and Relaxation Strategies and other evidenced-based practices will be used to promote progress towards healthy functioning.    Healthcare consumer will: Actively participate in therapy, working towards healthy functioning.     *Justification for Continuation/Discontinuation of Goal: R=Revised, O=Ongoing, A=Achieved, D=Discontinued   Goal 1) Jill Alexander will develop strategies to regulate her emotions and improve her positive self talk   Objective: Jill Alexander will incorporate her coping skills into her routine and use them more effectively and consistently   Likert rating baseline date 04/12/21: How Easy - 3; How Often - 50% Target Date Goal Was reviewed Status Code Progress towards goal/Likert rating  05/05/2024  05/06/2023 updated Ongoing                      Goal 2: Jill Alexander will improve  communication with others, especially close family members, by working on her tendency to assume the worst.   Target Date: 05/05/2024  Interventions: Therapist will help Jill Alexander to notice and disengage from maladaptive thoughts and behaviors using CBT based strategies Jill Alexander will have opportunities to process her experiences in session Therapist will engage Jill Alexander in discussion of coping strategies and creation of a plan to use them regularly Therapist will engage Jill Alexander in regular mood checks to continue to monitor her general mental health, prioritizing this at the start of the session to ensure it is covered   Therapist will provide additional resources as appropriate   Jill Alexander participated in development of tx plan and provided verbal consent.                                   Chrissie Noa, PhD               Chrissie Noa, PhD               Chrissie Noa, PhD

## 2023-05-22 ENCOUNTER — Other Ambulatory Visit (HOSPITAL_COMMUNITY): Payer: Self-pay

## 2023-05-22 DIAGNOSIS — F900 Attention-deficit hyperactivity disorder, predominantly inattentive type: Secondary | ICD-10-CM | POA: Diagnosis not present

## 2023-05-22 DIAGNOSIS — F411 Generalized anxiety disorder: Secondary | ICD-10-CM | POA: Diagnosis not present

## 2023-05-22 DIAGNOSIS — F840 Autistic disorder: Secondary | ICD-10-CM | POA: Diagnosis not present

## 2023-05-22 DIAGNOSIS — F331 Major depressive disorder, recurrent, moderate: Secondary | ICD-10-CM | POA: Diagnosis not present

## 2023-05-22 MED ORDER — LORAZEPAM 1 MG PO TABS
1.0000 mg | ORAL_TABLET | Freq: Two times a day (BID) | ORAL | 1 refills | Status: DC | PRN
  Filled 2023-05-22: qty 60, 30d supply, fill #0
  Filled 2023-07-15: qty 60, 30d supply, fill #1

## 2023-05-22 MED ORDER — DULOXETINE HCL 30 MG PO CPEP
30.0000 mg | ORAL_CAPSULE | Freq: Every day | ORAL | 1 refills | Status: DC
  Filled 2023-05-22: qty 30, 30d supply, fill #0
  Filled 2023-05-30 – 2023-06-16 (×3): qty 30, 30d supply, fill #1

## 2023-05-27 ENCOUNTER — Ambulatory Visit: Payer: Commercial Managed Care - PPO | Admitting: Clinical

## 2023-05-27 ENCOUNTER — Other Ambulatory Visit: Payer: Self-pay

## 2023-05-27 DIAGNOSIS — F840 Autistic disorder: Secondary | ICD-10-CM

## 2023-05-27 NOTE — Progress Notes (Signed)
Time: 11:00 am-11:58 am CPT Code: 78295A Diagnosis: F84.0  [name] was seen in person for therapy. She had reached out to the therapist via secure message and requested to talk about dynamics in her relationship with her sibling. She reflected upon boundaries in conversations, and when is appropriate to talk about politics, vs when is not. Therapist offered validation and support. She is scheduled to be seen again in one week.  Intake Presenting Problem [name] presented seeking CBT to help her manage difficulties with emotion regulation and reframing negative thought patterns that contribute to anxiety and worry. [name] has a diagnosis of Autism Spectrum Disorder (ASD). Symptoms feeling nervous or on edge, difficulty controlling worry, worrying about a variety of things, difficulty relaxing, irritability, fears for the future, feelings of hopelessness, difficulty sleeping, feeling tired, appetite irregularities, poor self-image, passive suicidal ideation without plan or intent (client contracted for safety during initial session) History of Problem  [name] reported that she moved from [place] with her family in July of 2018. Since that time, she has been seen by a provider in the area for challenges with emotion regulation and anxiety related to her diagnosis of autism spectrum disorder. [name] lives with her parents. She has a 30 year-old sister who has struggled with similar issues, as well as a brother who is 30 years older than she is and lives in [place]. [name] described a long history of therapy and therapeutic programs in order to help her manage these issues, including a partial hospitalization program in December of 2015.  Recent Trigger  [name] reported that she was seeking a change in pace with regard to her therapeutic treatment in order to ensure continued progress.  Marital and Family Information  Present family concerns/problems: [name] shared several accounts of disputes with her mother,  including her mother becoming upset with her for sharing worries with her immediately after her mother has woken up, despite her mother's requests that she not do so.  Strengths/resources in the family/friends: [name] described overall supportive relationships with her parents.  Marital/sexual history patterns: N/A  Family of Origin  Problems in family of origin: None reported.  Family background / ethnic factors: none reported.  No needs/concerns related to ethnicity reported when asked: No  Education/Vocation  Interpersonal concerns/problems: [name] reported that she has difficulties with social interactions related to her ASD diagnosis, and often perseverates about perceived missteps in both the immediate and distant past.  Personal strengths: [name] presented as motivated to improve her situation, and with some insight as to what is effective for her.  Military/work problems/concerns: None noted.  Leisure Activities/Daily Functioning  impaired functioning  Legal Status  No Legal Problems  Medical/Nutritional Concerns  unspecified  Comments: [name] reported that she often perseverates on perceived medical problems, and described herself as a hypochondriac  Religion/Spirituality  Not reported  Other  General Behavior: cooperative  Attire: appropriate  Gait: normal  Motor Activity: normal  Stream of Thought - Productivity: spontaneous  Stream of thought - Progression: normal  Stream of thought - Language: normal  Emotional tone and reactions - Affect: appropriate  Mental trend/Content of thoughts - Perception: normal  Mental trend/Content of thoughts - Orientation: normal  Mental trend/Content of thoughts - Memory: normal  Mental trend/Content of thoughts - General knowledge: consistent with education  Insight: good  Judgment: good  Intelligence: average  Diagnostic Summary  Autism Spectrum Disorder (F84.0)   [facility] Counselor/Therapist Progress Note   Patient  ID: Walter Min, MRN: [id number],  Individualized Treatment Plan Strengths: Enjoys reading, Seeking counseling, In training program through [employer], "I like humor and jokes"  Supports: Her dog, her bestfriend since childhood, her parents, her sister    Goal/Needs for Treatment:  In order of importance to patient 1) Emotional Regulation/Anxiety 2) Improve Positive self talk      Client Statement of Needs: "Find ways of being nicer to myself", cope w/ anxiety and worry    Treatment Level:Outpatient Counseling  Symptoms:Anxiety, Perseverative worry, Negative self talk  Client Treatment Preferences:weekly counseling,  Prefers to be called [name]    Healthcare consumer's goal for treatment:  Dr. Charlyne Mom will support the patient's ability to achieve the goals identified. Cognitive Behavioral Therapy, Supportive Counseling, Mindfulness and Relaxation Strategies and other evidenced-based practices will be used to promote progress towards healthy functioning.    Healthcare consumer will: Actively participate in therapy, working towards healthy functioning.     *Justification for Continuation/Discontinuation of Goal: R=Revised, O=Ongoing, A=Achieved, D=Discontinued   Goal 1) [name] will develop strategies to regulate her emotions and improve her positive self talk   Objective: [name] will incorporate her coping skills into her routine and use them more effectively and consistently   Likert rating baseline date 04/12/21: How Easy - 3; How Often - 50% Target Date Goal Was reviewed Status Code Progress towards goal/Likert rating  05/05/2024  05/06/2023 updated Ongoing                      Goal 2: [name] will improve communication with others, especially close family members, by working on her tendency to assume the worst.   Target Date: 05/05/2024  Interventions: Therapist will help [name] to notice and disengage from maladaptive thoughts and behaviors using CBT based  strategies [name] will have opportunities to process her experiences in session Therapist will engage [name] in discussion of coping strategies and creation of a plan to use them regularly Therapist will engage [name] in regular mood checks to continue to monitor her general mental health, prioritizing this at the start of the session to ensure it is covered   Therapist will provide additional resources as appropriate   [name] participated in development of tx plan and provided verbal consent.                                   Chrissie Noa, PhD            Chrissie Noa, PhD               Chrissie Noa, PhD

## 2023-05-28 DIAGNOSIS — H903 Sensorineural hearing loss, bilateral: Secondary | ICD-10-CM | POA: Diagnosis not present

## 2023-05-30 ENCOUNTER — Other Ambulatory Visit (HOSPITAL_COMMUNITY): Payer: Self-pay

## 2023-05-30 ENCOUNTER — Other Ambulatory Visit: Payer: Self-pay

## 2023-06-03 ENCOUNTER — Ambulatory Visit: Payer: Commercial Managed Care - PPO | Admitting: Clinical

## 2023-06-04 ENCOUNTER — Other Ambulatory Visit (HOSPITAL_COMMUNITY): Payer: Self-pay

## 2023-06-06 ENCOUNTER — Encounter: Payer: MEDICAID | Attending: Gastroenterology | Admitting: Dietician

## 2023-06-06 DIAGNOSIS — K219 Gastro-esophageal reflux disease without esophagitis: Secondary | ICD-10-CM | POA: Insufficient documentation

## 2023-06-06 NOTE — Patient Instructions (Addendum)
 Wait 3 hours after eating before lying down

## 2023-06-06 NOTE — Progress Notes (Signed)
 Medical Nutrition Therapy  Appointment Start time: 1030  Appointment End time:  1130  Primary concerns today: GERD control  Referral diagnosis: Gastroesophageal reflux disease, unspecified whether esophagitis present  Preferred learning style:  no preference indicated Learning readiness: change in progress   NUTRITION ASSESSMENT   Clinical Medical Hx:  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    Medications:  Current Outpatient Medications:    acetaminophen (TYLENOL) 500 MG tablet, Take 500 mg by mouth every 6 (six) hours as needed., Disp: , Rfl:    atomoxetine  (STRATTERA ) 25 MG capsule, Take 1 capsule (25 mg total) by mouth daily., Disp: 30 capsule, Rfl: 1   atomoxetine  (STRATTERA ) 40 MG capsule, Take 1 capsule (40 mg total) by mouth daily., Disp: 30 capsule, Rfl: 1   Caraway Oil-Levomenthol (FDGARD) 25-20.75 MG CAPS, Take as needed, Disp: , Rfl:    desvenlafaxine  (PRISTIQ ) 100 MG 24 hr tablet, Take 1 tablet (100 mg total) by mouth daily., Disp: 30 tablet, Rfl: 1   desvenlafaxine  (PRISTIQ ) 50 MG 24 hr tablet, Take one tablet daily, Disp: 30 tablet, Rfl: 1   Dextromethorphan -buPROPion  ER (AUVELITY ) 45-105 MG TBCR, Take 1 tablet by mouth 2 (two) times daily., Disp: 60 tablet, Rfl: 2   Dextromethorphan -buPROPion  ER (AUVELITY ) 45-105 MG TBCR, Take 1 tablet by mouth 2 (two) times daily. (Patient not taking: Reported on 02/13/2023), Disp: 180 tablet, Rfl: 1   Dextromethorphan -buPROPion  ER (AUVELITY ) 45-105 MG TBCR, Take 1 tablet by mouth 2 (two) times daily. (Patient not taking: Reported on 02/13/2023), Disp: 60 tablet, Rfl: 2   Dextromethorphan -buPROPion  ER (AUVELITY ) 45-105 MG TBCR, Take 1 tablet by mouth 2 (two) times daily. (Patient not taking:  Reported on 02/13/2023), Disp: 60 tablet, Rfl: 2   Dextromethorphan -buPROPion  ER (AUVELITY ) 45-105 MG TBCR, Take 1 tablet by mouth 2 (two) times daily., Disp: 60 tablet, Rfl: 5   Docusate Sodium (COLACE PO), Take by mouth., Disp: , Rfl:    drospirenone -ethinyl estradiol  (YAZ) 3-0.02 MG tablet, Take 1 tablet by mouth daily., Disp: 28 tablet, Rfl: 11   DULoxetine  (CYMBALTA ) 30 MG capsule, Take 1 capsule (30 mg total) by mouth daily., Disp: 30 capsule, Rfl: 1   Famotidine (PEPCID PO), Take by mouth., Disp: , Rfl:    Fremanezumab -vfrm (AJOVY ) 225 MG/1.5ML SOAJ, Inject 225 mg into the skin every 30 (thirty) days. (Patient not taking: Reported on 02/13/2023), Disp: 1.5 mL, Rfl: 11   Ibuprofen (MOTRIN PO), Take 200 mg by mouth as needed., Disp: , Rfl:    lamoTRIgine  (LAMICTAL ) 200 MG tablet, Take 2 tablets (400 mg total) by mouth daily., Disp: 180 tablet, Rfl: 1   LORazepam  (ATIVAN ) 1 MG tablet, Take 1 tablet (1 mg total) by mouth 2 (two) times daily as needed for severe anxiety (Patient not taking: Reported on 02/13/2023), Disp: 60 tablet, Rfl: 1   LORazepam  (ATIVAN ) 1 MG tablet, Take 1 tablet (1 mg total) by mouth 2 (two) times daily as needed for severe anxiety., Disp: 60 tablet, Rfl: 1   LORazepam  (ATIVAN ) 1 MG tablet, Take 1 tablet (1 mg total) by mouth 2 (two) times daily as needed for severe anxiety, Disp: 60 tablet, Rfl: 1   lurasidone  (LATUDA ) 40  MG TABS tablet, Take 1 tablet (40 mg total) by mouth daily with breakfast., Disp: 90 tablet, Rfl: 0   Lurasidone  HCl 60 MG TABS, Take 1 tablet (60 mg total) by mouth every evening with food. (Patient not taking: Reported on 02/13/2023), Disp: 30 tablet, Rfl: 1   Lurasidone  HCl 60 MG TABS, Take 1 tablet (60 mg total) by mouth every evening with food., Disp: 90 tablet, Rfl: 1   melatonin 1 MG TABS tablet, Take 1 tablet (1 mg total) by mouth at bedtime as needed (insomnia)., Disp: 30 tablet, Rfl: 0   mirabegron  ER (MYRBETRIQ ) 50 MG TB24 tablet, Take 1 tablet  (50 mg total) by mouth daily., Disp: 30 tablet, Rfl: 3   Multiple Vitamins-Minerals (MULTIVITAMIN PO), Take 1 tablet by mouth daily., Disp: , Rfl:    ofloxacin  (FLOXIN ) 0.3 % OTIC solution, Place 5 drops into both ears daily for 7 days, Disp: 5 mL, Rfl: 0   pantoprazole  (PROTONIX ) 40 MG tablet, Take 1 tablet (40 mg total) by mouth daily., Disp: 30 tablet, Rfl: 3   polyethylene glycol (MIRALAX ) packet, Take 17 g by mouth daily as needed., Disp: 14 each, Rfl: 0   rizatriptan  (MAXALT ) 10 MG tablet, Take 1 tablet (10 mg total) by mouth as needed for migraine. May repeat in 2 hours if needed., Disp: 10 tablet, Rfl: 11   topiramate  (TOPAMAX ) 50 MG tablet, Take 3 tablets (150 mg total) by mouth daily., Disp: 270 tablet, Rfl: 0   viloxazine ER (QELBREE ) 200 MG 24 hr capsule, Take 1 capsule (200 mg total) by mouth in the morning., Disp: 30 capsule, Rfl: 1   Labs:  No results found for: CHOL, HDL, LDLCALC, LDLDIRECT, TRIG, CHOLHDL Last vitamin D  Lab Results  Component Value Date   VD25OH 57.3 01/26/2021   BP Readings from Last 3 Encounters:  02/13/23 98/70  01/07/23 110/72  06/17/22 (!) 101/53    Lifestyle & Dietary Hx Pt present today alone. Pt denies keeping a food journal. Pt is lactose intolerant and selects almond milk or oat milk. Pt reports upon waking having some reflux symptoms including burning and wonders if this could be hunger. Pt reports skipping breakfast sometimes and states no changes with symptoms with or without breakfast intake. Pt reports selecting a wrap with some gluten and this food item tolerating well. Pt reports GERD trigger foods include tomatoes, tomato containing sauces, olives, some indian dishes,  burritos or mexican foods. Pt reports she continues to avoids beef and pork for personal preference. Pt reports she has difficulty avoiding eating before lying down to sleep. Pt denies raising head of bed and this was encouraged.  Pt reports increasing walks for  exercise, went to yoga on monday and has access to the [facility]. Pt reports she continues see therapy and psychiatry monthly and states a barrier to lifestyle changes stating diagnosed depression.  All Pt's questions were answered during this encounter.    Estimated daily fluid intake:  64 ounces Supplements: magnesium daily  Sleep: 9 hours nightly  Stress / self-care: 9 out of 10/ play with dog and kitten, reading, listening pod cast, walking  Current average weekly physical activity: walking 3-4 times weekly, went to yoga monday    First Meal: skips or banana, peanut butter or  Snack: none Second Meal: 12-2p: protein bar or skips alot Snack: banana with yogurt or magic spoon cereal, oat milk or instant kodiak oatmeal  Third Meal: wrap with beans, rice, cheese, chips and salsa, guacamole, spring mix  or pasta with red sauce, veggie meatball, salad with dressing  Snack: cookie Beverages: water, coffee with oat milk or almond milk    NUTRITION DIAGNOSIS  NB-1.1 Food and nutrition-related knowledge deficit As related to no prior nutrition related education.  As evidenced by Pt report and dietary recall.   NUTRITION INTERVENTION  Nutrition education (E-1) on the following topics:  Fruits & Vegetables: Aim to fill half your plate with a variety of fruits and vegetables. They are rich in vitamins, minerals, and fiber, and can help reduce the risk of chronic diseases. Choose a colorful assortment of fruits and vegetables to ensure you get a wide range of nutrients. Grains and Starches: Make at least half of your grain choices whole grains, such as brown rice, whole wheat bread, and oats. Whole grains provide fiber, which aids in digestion and healthy cholesterol levels. Aim for whole forms of starchy vegetables such as potatoes, sweet potatoes, beans, peas, and corn, which are fiber rich and provide many vitamins and minerals. Limit gluten as desired  Protein: Incorporate lean sources of protein, such  as poultry, fish, beans, nuts, and seeds, into your meals. Protein is essential for building and repairing tissues, staying full, balancing blood sugar, as well as supporting immune function. Dairy: Include low-fat or fat-free dairy products like lactose free milk, yogurt, and cheese in your diet. Dairy foods are excellent sources of calcium and vitamin D , which are crucial for bone health.  Physical Activity: Aim for 60 minutes of physical activity daily. Regular physical activity promotes overall health-including helping to reduce risk for heart disease and diabetes, promoting mental health, and helping us  sleep better.    Handouts Provided Include  Reviewed GERD MNT  Learning Style & Readiness for Change Teaching method utilized: Visual & Auditory  Demonstrated degree of understanding via: Teach Back  Barriers to learning/adherence to lifestyle change: depression per Pt reporting  Goals Established by Pt Wait 3 hours after eating before lying down   MONITORING & EVALUATION Dietary intake, weekly physical activity  Next Steps  Patient is to return PRN.

## 2023-06-10 ENCOUNTER — Ambulatory Visit: Payer: Commercial Managed Care - PPO | Admitting: Clinical

## 2023-06-10 DIAGNOSIS — F840 Autistic disorder: Secondary | ICD-10-CM

## 2023-06-10 NOTE — Progress Notes (Signed)
Time: 11:00 am-11:58 am CPT Code: 69629B Diagnosis: F84.0  Jill Alexander was seen remotely using secure video conferencing. She was in her home an therapist was in her office at time of appointment. Client is aware of risks of telehealth and consented to a virtual visit. Jill Alexander reported that she believes she has been having a depressive episode and has struggled to find motivation to look for jobs. Suicidal ideation and intent were denied, and she reported that she is safe. She had had a job interview at [employer] that had gone well. Therapist worked with her to create a behavioral activation plan, including going to the gym and calling a friend. She is scheduled to be seen again in one week.  Intake Presenting Problem Jill Alexander presented seeking CBT to help her manage difficulties with emotion regulation and reframing negative thought patterns that contribute to anxiety and worry. Jill Alexander has a diagnosis of Autism Spectrum Disorder (ASD). Symptoms feeling nervous or on edge, difficulty controlling worry, worrying about a variety of things, difficulty relaxing, irritability, fears for the future, feelings of hopelessness, difficulty sleeping, feeling tired, appetite irregularities, poor self-image, passive suicidal ideation without plan or intent (client contracted for safety during initial session) History of Problem  Jill Alexander reported that she moved from [place] with her family in July of 2018. Since that time, she has been seen by a provider in the area for challenges with emotion regulation and anxiety related to her diagnosis of autism spectrum disorder. Jill Alexander lives with her parents. She has a 60 year-old sister who has struggled with similar issues, as well as a brother who is three years older than she is and lives in [place]. Jill Alexander described a long history of therapy and therapeutic programs in order to help her manage these issues, including a partial hospitalization program in December of 2015.   Recent Trigger  Jill Alexander reported that she was seeking a change in pace with regard to her therapeutic treatment in order to ensure continued progress.  Marital and Family Information  Present family concerns/problems: Jill Alexander shared several accounts of disputes with her mother, including her mother becoming upset with her for sharing worries with her immediately after her mother has woken up, despite her mother's requests that she not do so.  Strengths/resources in the family/friends: Jill Alexander described overall supportive relationships with her parents.  Marital/sexual history patterns: N/A  Family of Origin  Problems in family of origin: None reported.  Family background / ethnic factors: none reported.  No needs/concerns related to ethnicity reported when asked: No  Education/Vocation  Interpersonal concerns/problems: Jill Alexander reported that she has difficulties with social interactions related to her ASD diagnosis, and often perseverates about perceived missteps in both the immediate and distant past.  Personal strengths: Jill Alexander presented as motivated to improve her situation, and with some insight as to what is effective for her.  Military/work problems/concerns: None noted.  Leisure Activities/Daily Functioning  impaired functioning  Legal Status  No Legal Problems  Medical/Nutritional Concerns  unspecified  Comments: Jill Alexander reported that she often perseverates on perceived medical problems, and described herself as a hypochondriac  Religion/Spirituality  Not reported  Other  General Behavior: cooperative  Attire: appropriate  Gait: normal  Motor Activity: normal  Stream of Thought - Productivity: spontaneous  Stream of thought - Progression: normal  Stream of thought - Language: normal  Emotional tone and reactions - Affect: appropriate  Mental trend/Content of thoughts - Perception: normal  Mental trend/Content of thoughts - Orientation: normal  Mental trend/Content  of thoughts -  Memory: normal  Mental trend/Content of thoughts - General knowledge: consistent with education  Insight: good  Judgment: good  Intelligence: average  Diagnostic Summary  Autism Spectrum Disorder (F84.0)   [facility] Counselor/Therapist Progress Note   Patient ID: Jill Alexander, MRN: [id number],     Individualized Treatment Plan Strengths: Enjoys reading, Seeking counseling, In training program through [employer], "I like humor and jokes"  Supports: Her dog, her bestfriend since childhood, her parents, her sister    Goal/Needs for Treatment:  In order of importance to patient 1) Emotional Regulation/Anxiety 2) Improve Positive self talk      Client Statement of Needs: "Find ways of being nicer to myself", cope w/ anxiety and worry    Treatment Level:Outpatient Counseling  Symptoms:Anxiety, Perseverative worry, Negative self talk  Client Treatment Preferences:weekly counseling,  Prefers to be called Jill Alexander    Healthcare consumer's goal for treatment:  Dr. Charlyne Mom will support the patient's ability to achieve the goals identified. Cognitive Behavioral Therapy, Supportive Counseling, Mindfulness and Relaxation Strategies and other evidenced-based practices will be used to promote progress towards healthy functioning.    Healthcare consumer will: Actively participate in therapy, working towards healthy functioning.     *Justification for Continuation/Discontinuation of Goal: R=Revised, O=Ongoing, A=Achieved, D=Discontinued   Goal 1) Jill Alexander will develop strategies to regulate her emotions and improve her positive self talk   Objective: Jill Alexander will incorporate her coping skills into her routine and use them more effectively and consistently   Likert rating baseline date 04/12/21: How Easy - 3; How Often - 50% Target Date Goal Was reviewed Status Code Progress towards goal/Likert rating  05/05/2024  05/06/2023 updated Ongoing                      Goal 2:  Jill Alexander will improve communication with others, especially close family members, by working on her tendency to assume the worst.   Target Date: 05/05/2024  Interventions: Therapist will help Jill Alexander to notice and disengage from maladaptive thoughts and behaviors using CBT based strategies Jill Alexander will have opportunities to process her experiences in session Therapist will engage Jill Alexander in discussion of coping strategies and creation of a plan to use them regularly Therapist will engage Jill Alexander in regular mood checks to continue to monitor her general mental health, prioritizing this at the start of the session to ensure it is covered   Therapist will provide additional resources as appropriate   Jill Alexander participated in development of tx plan and provided verbal consent.                                   Chrissie Noa, PhD            Chrissie Noa, PhD               Chrissie Noa, PhD               Chrissie Noa, PhD

## 2023-06-11 ENCOUNTER — Other Ambulatory Visit (HOSPITAL_COMMUNITY): Payer: Self-pay

## 2023-06-11 ENCOUNTER — Encounter (HOSPITAL_COMMUNITY): Payer: Self-pay | Admitting: Pharmacist

## 2023-06-15 ENCOUNTER — Other Ambulatory Visit: Payer: Self-pay

## 2023-06-17 ENCOUNTER — Ambulatory Visit: Payer: Commercial Managed Care - PPO | Admitting: Clinical

## 2023-06-17 DIAGNOSIS — F840 Autistic disorder: Secondary | ICD-10-CM

## 2023-06-17 NOTE — Progress Notes (Signed)
Time: 11:00 am-11:58 am CPT Code: 82956O Diagnosis: F84.0  Jill Alexander was seen in person for therapy. She reported that she continues to experience an increase in depression, and connected this to her mother being out of town. Therapist offered an opportunity to process and suggested revisiting the discussion of setting, holding, and respecting boundaries. For homework, Jill Alexander will find and start reading a book she purchased as part of her participation in the [facility] DBT group, as well as bring it to her next session. She is scheduled to be seen again in one week.  Intake Presenting Problem Jill Alexander presented seeking CBT to help her manage difficulties with emotion regulation and reframing negative thought patterns that contribute to anxiety and worry. Jill Alexander has a diagnosis of Autism Spectrum Disorder (ASD). Symptoms feeling nervous or on edge, difficulty controlling worry, worrying about a variety of things, difficulty relaxing, irritability, fears for the future, feelings of hopelessness, difficulty sleeping, feeling tired, appetite irregularities, poor self-image, passive suicidal ideation without plan or intent (client contracted for safety during initial session) History of Problem  Jill Alexander reported that she moved from [place] with her family in July of 2018. Since that time, she has been seen by a provider in the area for challenges with emotion regulation and anxiety related to her diagnosis of autism spectrum disorder. Jill Alexander lives with her parents. She has a 57 year-old sister who has struggled with similar issues, as well as a brother who is three years older than she is and lives in [place]. Jill Alexander described a long history of therapy and therapeutic programs in order to help her manage these issues, including a partial hospitalization program in December of 2015.  Recent Trigger  Jill Alexander reported that she was seeking a change in pace with regard to her therapeutic treatment in order to ensure continued  progress.  Marital and Family Information  Present family concerns/problems: Jill Alexander shared several accounts of disputes with her mother, including her mother becoming upset with her for sharing worries with her immediately after her mother has woken up, despite her mother's requests that she not do so.  Strengths/resources in the family/friends: Jill Alexander described overall supportive relationships with her parents.  Marital/sexual history patterns: N/A  Family of Origin  Problems in family of origin: None reported.  Family background / ethnic factors: none reported.  No needs/concerns related to ethnicity reported when asked: No  Education/Vocation  Interpersonal concerns/problems: Jill Alexander reported that she has difficulties with social interactions related to her ASD diagnosis, and often perseverates about perceived missteps in both the immediate and distant past.  Personal strengths: Jill Alexander presented as motivated to improve her situation, and with some insight as to what is effective for her.  Military/work problems/concerns: None noted.  Leisure Activities/Daily Functioning  impaired functioning  Legal Status  No Legal Problems  Medical/Nutritional Concerns  unspecified  Comments: Jill Alexander reported that she often perseverates on perceived medical problems, and described herself as a hypochondriac  Religion/Spirituality  Not reported  Other  General Behavior: cooperative  Attire: appropriate  Gait: normal  Motor Activity: normal  Stream of Thought - Productivity: spontaneous  Stream of thought - Progression: normal  Stream of thought - Language: normal  Emotional tone and reactions - Affect: appropriate  Mental trend/Content of thoughts - Perception: normal  Mental trend/Content of thoughts - Orientation: normal  Mental trend/Content of thoughts - Memory: normal  Mental trend/Content of thoughts - General knowledge: consistent with education  Insight: good  Judgment: good  Intelligence:  average  Diagnostic  Summary  Autism Spectrum Disorder (F84.0)   [facility] Counselor/Therapist Progress Note   Patient ID: Jill Alexander, MRN: [id number],     Individualized Treatment Plan Strengths: Enjoys reading, Seeking counseling, In training program through [employer], "I like humor and jokes"  Supports: Her dog, her bestfriend since childhood, her parents, her sister    Goal/Needs for Treatment:  In order of importance to patient 1) Emotional Regulation/Anxiety 2) Improve Positive self talk      Client Statement of Needs: "Find ways of being nicer to myself", cope w/ anxiety and worry    Treatment Level:Outpatient Counseling  Symptoms:Anxiety, Perseverative worry, Negative self talk  Client Treatment Preferences:weekly counseling,  Prefers to be called Jill Alexander    Healthcare consumer's goal for treatment:  Dr. Charlyne Mom will support the patient's ability to achieve the goals identified. Cognitive Behavioral Therapy, Supportive Counseling, Mindfulness and Relaxation Strategies and other evidenced-based practices will be used to promote progress towards healthy functioning.    Healthcare consumer will: Actively participate in therapy, working towards healthy functioning.     *Justification for Continuation/Discontinuation of Goal: R=Revised, O=Ongoing, A=Achieved, D=Discontinued   Goal 1) Jill Alexander will develop strategies to regulate her emotions and improve her positive self talk   Objective: Jill Alexander will incorporate her coping skills into her routine and use them more effectively and consistently   Likert rating baseline date 04/12/21: How Easy - 3; How Often - 50% Target Date Goal Was reviewed Status Code Progress towards goal/Likert rating  05/05/2024  05/06/2023 updated Ongoing                      Goal 2: Jill Alexander will improve communication with others, especially close family members, by working on her tendency to assume the worst.   Target Date:  05/05/2024  Interventions: Therapist will help Jill Alexander to notice and disengage from maladaptive thoughts and behaviors using CBT based strategies Jill Alexander will have opportunities to process her experiences in session Therapist will engage Jill Alexander in discussion of coping strategies and creation of a plan to use them regularly Therapist will engage Jill Alexander in regular mood checks to continue to monitor her general mental health, prioritizing this at the start of the session to ensure it is covered   Therapist will provide additional resources as appropriate   Jill Alexander participated in development of tx plan and provided verbal consent.        Chrissie Noa, PhD               Chrissie Noa, PhD

## 2023-06-19 ENCOUNTER — Other Ambulatory Visit (HOSPITAL_COMMUNITY): Payer: Self-pay

## 2023-06-20 ENCOUNTER — Other Ambulatory Visit: Payer: Self-pay

## 2023-06-24 ENCOUNTER — Ambulatory Visit: Payer: Commercial Managed Care - PPO | Admitting: Clinical

## 2023-06-24 DIAGNOSIS — F840 Autistic disorder: Secondary | ICD-10-CM | POA: Diagnosis not present

## 2023-06-24 NOTE — Progress Notes (Signed)
 Time: 11:00 am-11:58 am CPT Code: 19147W Diagnosis: F84.0  Jill Alexander was seen in person for therapy. She shared that she had received feedback from her mother that she would like for Jill Alexander to work on a tendency to immediately ask her mother questions before trying to figure them out on her own. Therapist engaged her in discussion of steps she can try before asking her mother. She created a plan to try googling questions as appropriate, engaging in at least 10 minutes independent problem solving, and if those don't work, asking her dad. She is scheduled to be seen again in one week.  Intake Presenting Problem Jill Alexander presented seeking CBT to help her manage difficulties with emotion regulation and reframing negative thought patterns that contribute to anxiety and worry. Jill Alexander has a diagnosis of Autism Spectrum Disorder (ASD). Symptoms feeling nervous or on edge, difficulty controlling worry, worrying about a variety of things, difficulty relaxing, irritability, fears for the future, feelings of hopelessness, difficulty sleeping, feeling tired, appetite irregularities, poor self-image, passive suicidal ideation without plan or intent (client contracted for safety during initial session) History of Problem  Jill Alexander reported that she moved from [place] with her family in July of 2018. Since that time, she has been seen by a provider in the area for challenges with emotion regulation and anxiety related to her diagnosis of autism spectrum disorder. Jill Alexander lives with her parents. She has a 35 year-old sister who has struggled with similar issues, as well as a brother who is three years older than she is and lives in [place]. Jill Alexander described a long history of therapy and therapeutic programs in order to help her manage these issues, including a partial hospitalization program in December of 2015.  Recent Trigger  Jill Alexander reported that she was seeking a change in pace with regard to her therapeutic treatment in order to  ensure continued progress.  Marital and Family Information  Present family concerns/problems: Jill Alexander shared several accounts of disputes with her mother, including her mother becoming upset with her for sharing worries with her immediately after her mother has woken up, despite her mother's requests that she not do so.  Strengths/resources in the family/friends: Jill Alexander described overall supportive relationships with her parents.  Marital/sexual history patterns: N/A  Family of Origin  Problems in family of origin: None reported.  Family background / ethnic factors: none reported.  No needs/concerns related to ethnicity reported when asked: No  Education/Vocation  Interpersonal concerns/problems: Jill Alexander reported that she has difficulties with social interactions related to her ASD diagnosis, and often perseverates about perceived missteps in both the immediate and distant past.  Personal strengths: Jill Alexander presented as motivated to improve her situation, and with some insight as to what is effective for her.  Military/work problems/concerns: None noted.  Leisure Activities/Daily Functioning  impaired functioning  Legal Status  No Legal Problems  Medical/Nutritional Concerns  unspecified  Comments: Jill Alexander reported that she often perseverates on perceived medical problems, and described herself as a hypochondriac  Religion/Spirituality  Not reported  Other  General Behavior: cooperative  Attire: appropriate  Gait: normal  Motor Activity: normal  Stream of Thought - Productivity: spontaneous  Stream of thought - Progression: normal  Stream of thought - Language: normal  Emotional tone and reactions - Affect: appropriate  Mental trend/Content of thoughts - Perception: normal  Mental trend/Content of thoughts - Orientation: normal  Mental trend/Content of thoughts - Memory: normal  Mental trend/Content of thoughts - General knowledge: consistent with education  Insight: good  Judgment: good   Intelligence: average  Diagnostic Summary  Autism Spectrum Disorder (F84.0)   [facility] Counselor/Therapist Progress Note   Patient ID: Jill Alexander, MRN: [id number],     Individualized Treatment Plan Strengths: Enjoys reading, Seeking counseling, In training program through [employer], "I like humor and jokes"  Supports: Her dog, her bestfriend since childhood, her parents, her sister    Goal/Needs for Treatment:  In order of importance to patient 1) Emotional Regulation/Anxiety 2) Improve Positive self talk      Client Statement of Needs: "Find ways of being nicer to myself", cope w/ anxiety and worry    Treatment Level:Outpatient Counseling  Symptoms:Anxiety, Perseverative worry, Negative self talk  Client Treatment Preferences:weekly counseling,  Prefers to be called Jill Alexander    Healthcare consumer's goal for treatment:  Dr. Charlyne Mom will support the patient's ability to achieve the goals identified. Cognitive Behavioral Therapy, Supportive Counseling, Mindfulness and Relaxation Strategies and other evidenced-based practices will be used to promote progress towards healthy functioning.    Healthcare consumer will: Actively participate in therapy, working towards healthy functioning.     *Justification for Continuation/Discontinuation of Goal: R=Revised, O=Ongoing, A=Achieved, D=Discontinued   Goal 1) Jill Alexander will develop strategies to regulate her emotions and improve her positive self talk   Objective: Jill Alexander will incorporate her coping skills into her routine and use them more effectively and consistently   Likert rating baseline date 04/12/21: How Easy - 3; How Often - 50% Target Date Goal Was reviewed Status Code Progress towards goal/Likert rating  05/05/2024  05/06/2023 updated Ongoing                      Goal 2: Jill Alexander will improve communication with others, especially close family members, by working on her tendency to assume the worst.    Target Date: 05/05/2024  Interventions: Therapist will help Jill Alexander to notice and disengage from maladaptive thoughts and behaviors using CBT based strategies Jill Alexander will have opportunities to process her experiences in session Therapist will engage Jill Alexander in discussion of coping strategies and creation of a plan to use them regularly Therapist will engage Jill Alexander in regular mood checks to continue to monitor her general mental health, prioritizing this at the start of the session to ensure it is covered   Therapist will provide additional resources as appropriate   Jill Alexander participated in development of tx plan and provided verbal consent.            Chrissie Noa, PhD               Chrissie Noa, PhD

## 2023-06-26 ENCOUNTER — Other Ambulatory Visit (HOSPITAL_COMMUNITY): Payer: Self-pay

## 2023-06-26 DIAGNOSIS — F411 Generalized anxiety disorder: Secondary | ICD-10-CM | POA: Diagnosis not present

## 2023-06-26 DIAGNOSIS — F900 Attention-deficit hyperactivity disorder, predominantly inattentive type: Secondary | ICD-10-CM | POA: Diagnosis not present

## 2023-06-26 DIAGNOSIS — F331 Major depressive disorder, recurrent, moderate: Secondary | ICD-10-CM | POA: Diagnosis not present

## 2023-06-26 DIAGNOSIS — F840 Autistic disorder: Secondary | ICD-10-CM | POA: Diagnosis not present

## 2023-06-26 MED ORDER — DULOXETINE HCL 60 MG PO CPEP
60.0000 mg | ORAL_CAPSULE | Freq: Every day | ORAL | 1 refills | Status: DC
  Filled 2023-06-26: qty 30, 30d supply, fill #0
  Filled 2023-07-15 – 2023-07-21 (×2): qty 30, 30d supply, fill #1

## 2023-06-28 IMAGING — US US BREAST*R* LIMITED INC AXILLA
1 series · 4 of 4 positions shown · non-contrast
Comparison: None.

CLINICAL DATA: 25-year-old female with intermittent/cyclical
predominantly upper outer quadrant right breast pain.

EXAM:
ULTRASOUND OF THE RIGHT BREAST

[Series 1: us breast*right* limited inc axilla · 0.06mm/px · 4 of 4 slices shown]
[im 1/4]
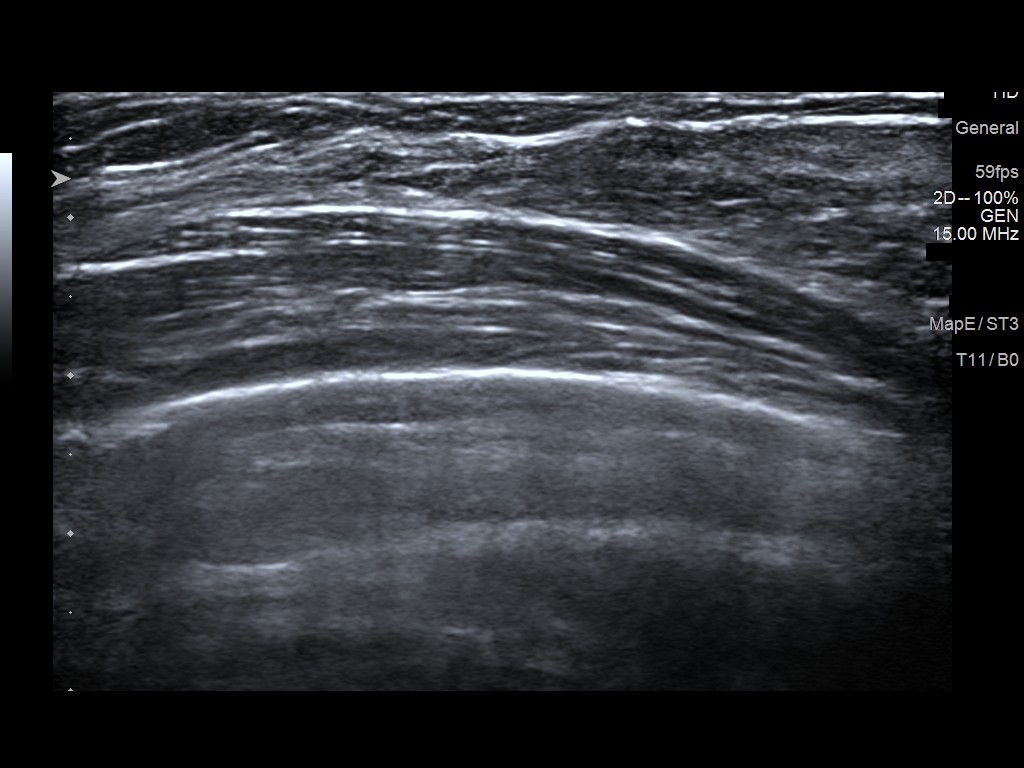
[im 2/4]
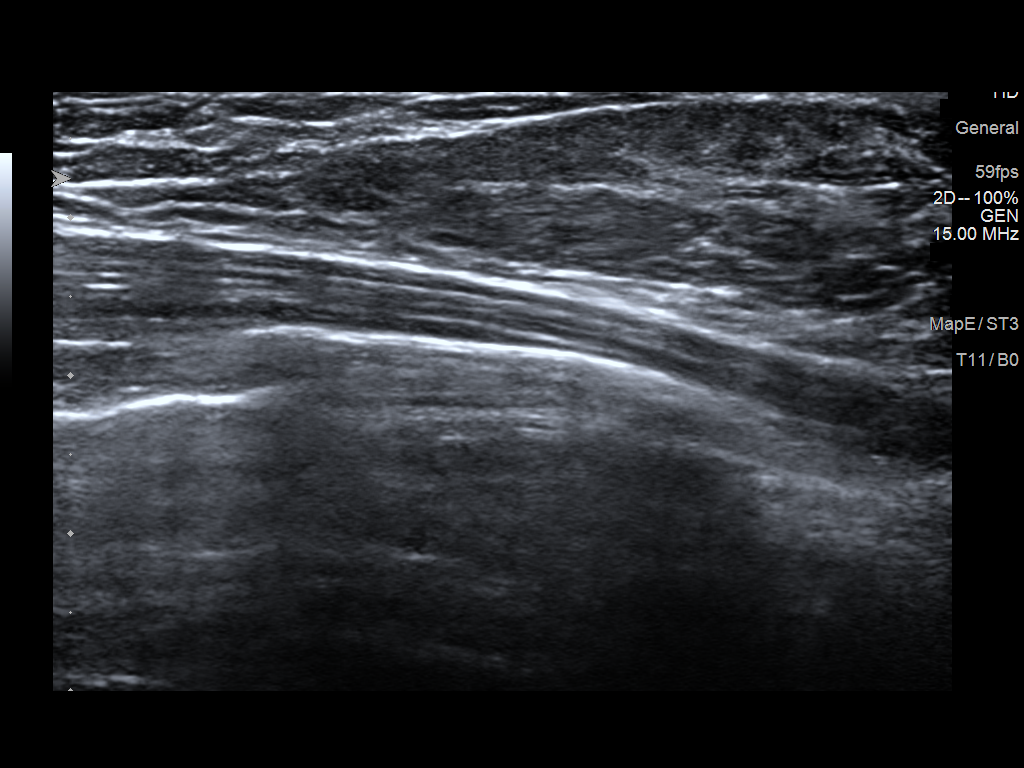
[im 3/4]
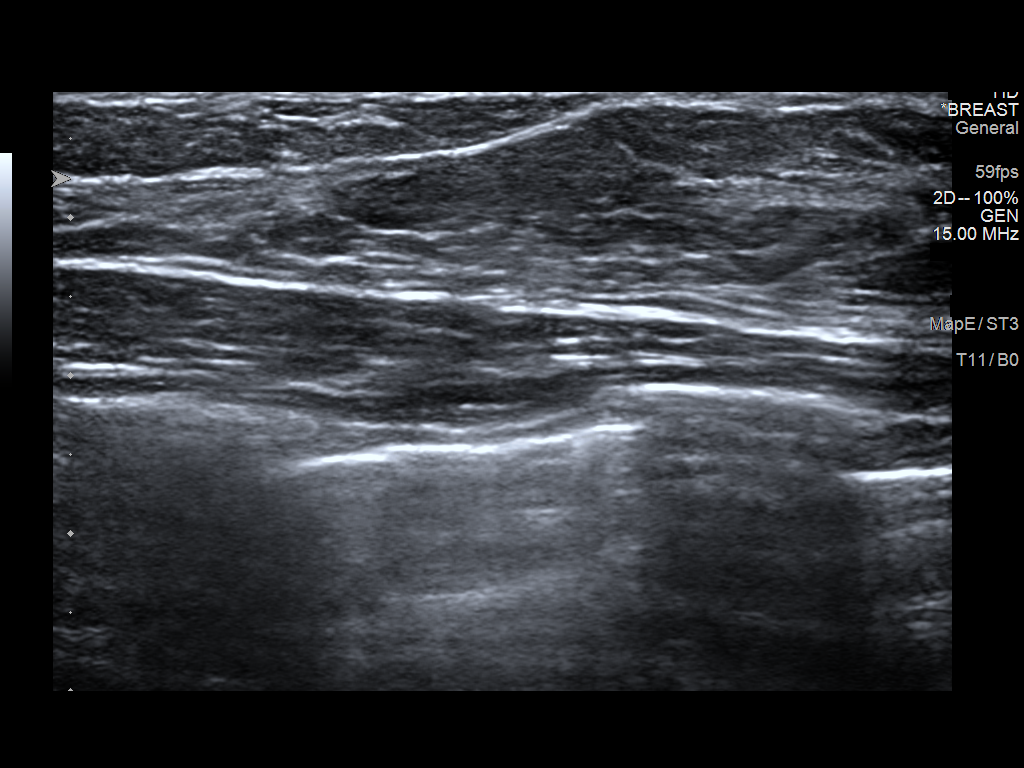
[im 4/4]
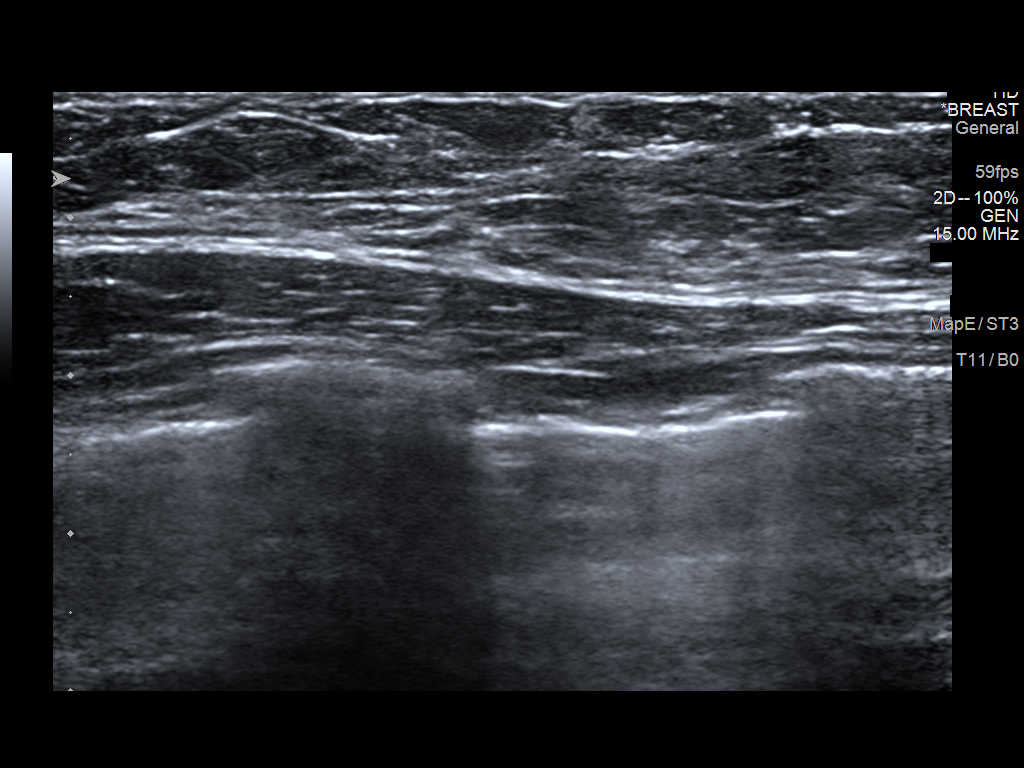

[4 of 4 positions shown; findings below may reference images not displayed]

FINDINGS: Physical examination of the upper-outer right breast in region of
concern does not reveal any discrete palpable masses.

Targeted ultrasound of the right breast was performed. No suspicious
masses or abnormality seen, only heterogeneous fibroglandular tissue
identified.
IMPRESSION: No sonographic abnormalities at site of concern in the upper-outer
right breast.

RECOMMENDATION:
1. Recommend further management of the right breast area of concern
be based on clinical assessment.

2. Screening mammogram at age 38 unless there are persistent or
intervening clinical concerns. (Code:[id number]-[id number])

I have discussed the findings and recommendations with the patient.
If applicable, a reminder letter will be sent to the patient
regarding the next appointment.

BI-RADS CATEGORY  1: Negative.

## 2023-07-01 ENCOUNTER — Ambulatory Visit: Payer: Commercial Managed Care - PPO | Admitting: Clinical

## 2023-07-01 DIAGNOSIS — F840 Autistic disorder: Secondary | ICD-10-CM | POA: Diagnosis not present

## 2023-07-01 NOTE — Progress Notes (Signed)
 Time: 11:00 am-11:58 am CPT Code: [id number] Diagnosis: F84.0  [name] was seen in person for therapy. She had successfully implemented strategies discussed the previous week. Session focused on the idea of setting mini goals. She set a mini goal to engage in one self-care activity that does not involve screens. She will also continue using strategies to problem solve before asking her mother for help. She is scheduled to be seen again in one week.  Intake Presenting Problem [name] presented seeking CBT to help her manage difficulties with emotion regulation and reframing negative thought patterns that contribute to anxiety and worry. [name] has a diagnosis of Autism Spectrum Disorder (ASD). Symptoms feeling nervous or on edge, difficulty controlling worry, worrying about a variety of things, difficulty relaxing, irritability, fears for the future, feelings of hopelessness, difficulty sleeping, feeling tired, appetite irregularities, poor self-image, passive suicidal ideation without plan or intent (client contracted for safety during initial session) History of Problem  [name] reported that she moved from [place] with her family in July of 2018. Since that time, she has been seen by a provider in the area for challenges with emotion regulation and anxiety related to her diagnosis of autism spectrum disorder. [name] lives with her parents. She has a 74 year-old sister who has struggled with similar issues, as well as a brother who is three years older than she is and lives in [place]. [name] described a long history of therapy and therapeutic programs in order to help her manage these issues, including a partial hospitalization program in December of 2015.  Recent Trigger  [name] reported that she was seeking a change in pace with regard to her therapeutic treatment in order to ensure continued progress.  Marital and Family Information  Present family concerns/problems: [name] shared several accounts of disputes  with her mother, including her mother becoming upset with her for sharing worries with her immediately after her mother has woken up, despite her mother's requests that she not do so.  Strengths/resources in the family/friends: [name] described overall supportive relationships with her parents.  Marital/sexual history patterns: N/A  Family of Origin  Problems in family of origin: None reported.  Family background / ethnic factors: none reported.  No needs/concerns related to ethnicity reported when asked: No  Education/Vocation  Interpersonal concerns/problems: [name] reported that she has difficulties with social interactions related to her ASD diagnosis, and often perseverates about perceived missteps in both the immediate and distant past.  Personal strengths: [name] presented as motivated to improve her situation, and with some insight as to what is effective for her.  Military/work problems/concerns: None noted.  Leisure Activities/Daily Functioning  impaired functioning  Legal Status  No Legal Problems  Medical/Nutritional Concerns  unspecified  Comments: [name] reported that she often perseverates on perceived medical problems, and described herself as a hypochondriac  Religion/Spirituality  Not reported  Other  General Behavior: cooperative  Attire: appropriate  Gait: normal  Motor Activity: normal  Stream of Thought - Productivity: spontaneous  Stream of thought - Progression: normal  Stream of thought - Language: normal  Emotional tone and reactions - Affect: appropriate  Mental trend/Content of thoughts - Perception: normal  Mental trend/Content of thoughts - Orientation: normal  Mental trend/Content of thoughts - Memory: normal  Mental trend/Content of thoughts - General knowledge: consistent with education  Insight: good  Judgment: good  Intelligence: average  Diagnostic Summary  Autism Spectrum Disorder (F84.0)   [facility] Counselor/Therapist Progress  Note   Patient ID: Anayi Bricco  Slivka, MRN: [id number],     Individualized Treatment Plan Strengths: Enjoys reading, Seeking counseling, In training program through [employer], "I like humor and jokes"  Supports: Her dog, her bestfriend since childhood, her parents, her sister    Goal/Needs for Treatment:  In order of importance to patient 1) Emotional Regulation/Anxiety 2) Improve Positive self talk      Client Statement of Needs: "Find ways of being nicer to myself", cope w/ anxiety and worry    Treatment Level:Outpatient Counseling  Symptoms:Anxiety, Perseverative worry, Negative self talk  Client Treatment Preferences:weekly counseling,  Prefers to be called [name]    Healthcare consumer's goal for treatment:  Dr. Charlyne Mom will support the patient's ability to achieve the goals identified. Cognitive Behavioral Therapy, Supportive Counseling, Mindfulness and Relaxation Strategies and other evidenced-based practices will be used to promote progress towards healthy functioning.    Healthcare consumer will: Actively participate in therapy, working towards healthy functioning.     *Justification for Continuation/Discontinuation of Goal: R=Revised, O=Ongoing, A=Achieved, D=Discontinued   Goal 1) [name] will develop strategies to regulate her emotions and improve her positive self talk   Objective: [name] will incorporate her coping skills into her routine and use them more effectively and consistently   Likert rating baseline date 04/12/21: How Easy - 3; How Often - 50% Target Date Goal Was reviewed Status Code Progress towards goal/Likert rating  05/05/2024  05/06/2023 updated Ongoing                      Goal 2: [name] will improve communication with others, especially close family members, by working on her tendency to assume the worst.   Target Date: 05/05/2024  Interventions: Therapist will help [name] to notice and disengage from maladaptive thoughts and behaviors using  CBT based strategies [name] will have opportunities to process her experiences in session Therapist will engage [name] in discussion of coping strategies and creation of a plan to use them regularly Therapist will engage [name] in regular mood checks to continue to monitor her general mental health, prioritizing this at the start of the session to ensure it is covered   Therapist will provide additional resources as appropriate   [name] participated in development of tx plan and provided verbal consent.            Chrissie Noa, PhD               Chrissie Noa, PhD               Chrissie Noa, PhD

## 2023-07-07 ENCOUNTER — Other Ambulatory Visit: Payer: Self-pay | Admitting: Gastroenterology

## 2023-07-08 ENCOUNTER — Other Ambulatory Visit: Payer: Self-pay

## 2023-07-08 ENCOUNTER — Ambulatory Visit: Payer: 59 | Admitting: Neurology

## 2023-07-08 ENCOUNTER — Encounter: Payer: Self-pay | Admitting: Neurology

## 2023-07-08 ENCOUNTER — Other Ambulatory Visit (HOSPITAL_COMMUNITY): Payer: Self-pay

## 2023-07-08 ENCOUNTER — Ambulatory Visit: Payer: 59 | Admitting: Clinical

## 2023-07-08 VITALS — BP 99/62 | HR 69 | Ht 62.0 in | Wt 129.0 lb

## 2023-07-08 DIAGNOSIS — F840 Autistic disorder: Secondary | ICD-10-CM

## 2023-07-08 DIAGNOSIS — G935 Compression of brain: Secondary | ICD-10-CM | POA: Diagnosis not present

## 2023-07-08 DIAGNOSIS — F332 Major depressive disorder, recurrent severe without psychotic features: Secondary | ICD-10-CM

## 2023-07-08 DIAGNOSIS — G43019 Migraine without aura, intractable, without status migrainosus: Secondary | ICD-10-CM | POA: Diagnosis not present

## 2023-07-08 DIAGNOSIS — R569 Unspecified convulsions: Secondary | ICD-10-CM

## 2023-07-08 MED ORDER — PANTOPRAZOLE SODIUM 40 MG PO TBEC
40.0000 mg | DELAYED_RELEASE_TABLET | Freq: Every day | ORAL | 3 refills | Status: DC
  Filled 2023-07-08: qty 30, 30d supply, fill #0
  Filled 2023-07-15 – 2023-08-01 (×2): qty 30, 30d supply, fill #1
  Filled 2023-08-07 – 2023-09-16 (×2): qty 30, 30d supply, fill #2
  Filled 2023-09-22 – 2023-10-27 (×3): qty 30, 30d supply, fill #3

## 2023-07-08 NOTE — Patient Instructions (Addendum)
   ASSESSMENT AND PLAN 28 y.o. year old female patient  here for :    Migraine and remote history of seizures.     1) autism, depression, insomnia in the past.  She protect herself from stimulus overload by wearing headphones.   2)  remote history  of 2 seizures for which she was placed on Lamictal- an would like to wean off.  Marland Kitchen   3) migraines are well controlled on every 30 d sq injections of Ajovy .   I have no concern form the seizure standpoint to wean off Lamictal , but it may have helped to buffer her mood disorder and even reduce migraines to some degree-  and I like the input of her behavioral health provider  before I start weaning off.   Note to :  [place] [facility]. [address]   I would like to suggest the following weaning scale - if her mental health provider agrees:  the patient is currently taking 200 mg of Lamictal twice a day but needs a 400 mg dose total per day.  I would change her Lamictal 200 mg so that she can easier wean off and smaller steps and would ask her to take 100 mg in the evening and 200 mg in the morning for a month then I would like her to return to 100 mg twice a day for a second month and if she remains seizure-free we will then wean from there on for another months on 100 daily.   I plan to follow up either personally or through our NP within 6 months.   I would like to thank Carilyn Goodpasture, NP and No referring provider defined for this encounter. for allowing me to meet with and to take care of this pleasant patient.

## 2023-07-08 NOTE — Progress Notes (Signed)
 Provider:  Melvyn Novas, MD  Primary Care Physician:  Carilyn Goodpasture, NP [phone] [place] [address]     Referring Provider: No referring provider defined for this encounter.          Chief Complaint according to patient   Patient presents with:                HISTORY OF PRESENT ILLNESS:  Jill Alexander is a 28 y.o. female patient who is here for revisit 07/08/2023 for  migraine management in the setting of autism,  depression, and seizures. .  Chief concern according to patient :  " do I still need to be on Lamictal ?  Both of her seizures occurred while her mother was present , one in senior year of HS and one the year after , last seizure happened in Spring 2015, now almost 10 years  seizure free and would like to wean. The patein has had some of her medications changed since our last visit.   She is autistic uses headphones to eliminate outside stimuli,  she is not driving.    Strattera was not working well, d/c, she stopped Pristiq with the knowledge of her mental health professional.  Newest addition is Cymbalta , this is her third months.  Auvelity:  this contains wellbutrin in generic form ( usually not a good choice for seizure patients - 105 mg dose per tablet)  Ajovy for migraine - its helping with migraine frequency , not intensity.  [facility] at [facility]. [address].     Fam Hx: no changes.  Social HX : lost a part time job- January 2025. Currently more of a gig, inconsistent hours.    Review of Systems: Out of a complete 14 system review, the patient complains of only the following symptoms, and all other reviewed systems are negative.:   Social History   Socioeconomic History   Marital status: Single    Spouse name: Not on file   Number of children: 0   Years of education: Not on file   Highest education level: Some college, no degree  Occupational History   Not on  file  Tobacco Use   Smoking status: Never   Smokeless tobacco: Never  Vaping Use   Vaping status: Never Used  Substance and Sexual Activity   Alcohol use: Yes    Comment: 1 cocktail a few times a year   Drug use: No   Sexual activity: Not Currently    Birth control/protection: Pill  Other Topics Concern   Not on file  Social History Narrative   Lives with parents in [place] with her mom and dad. Pt moved here from [place] in 2018.      Siblings- Pt is the youngest of out 3 siblings. 1 sister 9 years older than her, 1 brother who is 3 years older than her.    Schooling- started college right after HS graduation and stopped after a year   Married- denies    Kids- denies    Legal issues- denies       Caffeine use: 2-3 cups per day   Left handed    Social Drivers of Corporate investment banker Strain: Low Risk  (05/27/2018)   Overall Financial Resource Strain (CARDIA)    Difficulty of Paying Living Expenses: Not hard at all  Food Insecurity: No Food Insecurity (04/14/2023)   Hunger Vital Sign  Worried About Programme researcher, broadcasting/film/video in the Last Year: Never true    Ran Out of Food in the Last Year: Never true  Transportation Needs: Unmet Transportation Needs (06/29/2018)   PRAPARE - Administrator, Civil Service (Medical): Yes    Lack of Transportation (Non-Medical): Yes  Physical Activity: Unknown (06/29/2018)   Exercise Vital Sign    Days of Exercise per Week: 3 days    Minutes of Exercise per Session: Not on file  Recent Concern: Physical Activity - Insufficiently Active (05/27/2018)   Exercise Vital Sign    Days of Exercise per Week: 2 days    Minutes of Exercise per Session: 30 min  Stress: Stress Concern Present (06/29/2018)   [employer] of Occupational Health - Occupational Stress Questionnaire    Feeling of Stress : Rather much  Social Connections: Unknown (06/29/2018)   Social Connection and Isolation Panel [NHANES]    Frequency of Communication with  Friends and Family: Not on file    Frequency of Social Gatherings with Friends and Family: Not on file    Attends Religious Services: Not on file    Active Member of Clubs or Organizations: Not on file    Attends Banker Meetings: Not on file    Marital Status: Never married    Family History  Problem Relation Age of Onset   Depression Father    Depression Sister    Anxiety disorder Sister    Diabetes Maternal Grandfather    Cancer Maternal Grandfather    Breast cancer Paternal Grandmother    Uterine cancer Paternal Grandmother    Irritable bowel syndrome Paternal Grandmother    Colon cancer Neg Hx    Esophageal cancer Neg Hx    Stomach cancer Neg Hx    Rectal cancer Neg Hx     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    Past Surgical History:  Procedure Laterality Date   none       Current Outpatient Medications on File Prior to Visit  Medication Sig Dispense Refill   acetaminophen (TYLENOL) 500 MG tablet Take 500 mg by mouth every 6 (six) hours as needed.     Caraway Oil-Levomenthol (FDGARD) 25-20.75 MG CAPS Take as needed     Dextromethorphan-buPROPion ER (AUVELITY) 45-105 MG TBCR Take 1 tablet by mouth 2 (two) times daily. 60 tablet 2   Dextromethorphan-buPROPion ER (AUVELITY) 45-105 MG TBCR Take 1 tablet by mouth 2 (two) times daily. 180 tablet 1   Dextromethorphan-buPROPion ER (AUVELITY) 45-105 MG TBCR Take 1 tablet by mouth 2 (two) times daily. 60 tablet 2   Dextromethorphan-buPROPion ER (AUVELITY) 45-105 MG TBCR Take 1 tablet by mouth 2 (two) times daily. 60 tablet 2   Dextromethorphan-buPROPion ER (AUVELITY) 45-105 MG TBCR Take 1 tablet by mouth 2 (two) times daily. 60 tablet 5   Docusate Sodium (COLACE PO) Take  by mouth.     DULoxetine (CYMBALTA) 60 MG capsule Take one capsule daily 30 capsule 1   Famotidine (PEPCID PO) Take by mouth.     Fremanezumab-vfrm (AJOVY) 225 MG/1.5ML SOAJ Inject 225 mg into the skin every 30 (thirty) days. 1.5 mL 11  Ibuprofen (MOTRIN PO) Take 200 mg by mouth as needed.     lamoTRIgine (LAMICTAL) 200 MG tablet Take 2 tablets (400 mg total) by mouth daily. 180 tablet 1   LORazepam (ATIVAN) 1 MG tablet Take 1 tablet (1 mg total) by mouth 2 (two) times daily as needed for severe anxiety 60 tablet 1   LORazepam (ATIVAN) 1 MG tablet Take 1 tablet (1 mg total) by mouth 2 (two) times daily as needed for severe anxiety. 60 tablet 1   LORazepam (ATIVAN) 1 MG tablet Take 1 tablet (1 mg total) by mouth 2 (two) times daily as needed for severe anxiety 60 tablet 1   lurasidone (LATUDA) 40 MG TABS tablet Take 1 tablet (40 mg total) by mouth daily with breakfast. 90 tablet 0   Lurasidone HCl 60 MG TABS Take 1 tablet (60 mg total) by mouth every evening with food. 30 tablet 1   Lurasidone HCl 60 MG TABS Take 1 tablet (60 mg total) by mouth every evening with food. 90 tablet 1   melatonin 1 MG TABS tablet Take 1 tablet (1 mg total) by mouth at bedtime as needed (insomnia). 30 tablet 0   mirabegron ER (MYRBETRIQ) 50 MG TB24 tablet Take 1 tablet (50 mg total) by mouth daily. 30 tablet 3   Multiple Vitamins-Minerals (MULTIVITAMIN PO) Take 1 tablet by mouth daily.     pantoprazole (PROTONIX) 40 MG tablet Take 1 tablet (40 mg total) by mouth daily. 30 tablet 3   polyethylene glycol (MIRALAX) packet Take 17 g by mouth daily as needed. 14 each 0   rizatriptan (MAXALT) 10 MG tablet Take 1 tablet (10 mg total) by mouth as needed for migraine. May repeat in 2 hours if needed. 10 tablet 11   topiramate (TOPAMAX) 50 MG tablet Take 3 tablets (150 mg total) by mouth daily. 270 tablet 0   [DISCONTINUED] Norethin Ace-Eth Estrad-FE (TAYTULLA) 1-20 MG-MCG(24) CAPS TAKE 1 CAPSULE BY MOUTH DAILY. 28 capsule 11    No current facility-administered medications on file prior to visit.    Allergies  Allergen Reactions   Gluten Meal      DIAGNOSTIC DATA (LABS, IMAGING, TESTING) - I reviewed patient records, labs, notes, testing and imaging myself where available.  Lab Results  Component Value Date   WBC 6.1 09/25/2021   HGB 13.8 09/25/2021   HCT 40.5 09/25/2021   MCV 92 09/25/2021   PLT 264 09/25/2021      Component Value Date/Time   NA 139 09/25/2021 1630   K 4.1 09/25/2021 1630   CL 107 (H) 09/25/2021 1630   CO2 19 (L) 09/25/2021 1630   GLUCOSE 76 09/25/2021 1630   GLUCOSE 83 07/01/2018 1537   BUN 17 09/25/2021 1630   CREATININE 0.85 09/25/2021 1630   CALCIUM 9.3 09/25/2021 1630   PROT 6.5 09/25/2021 1630   ALBUMIN 4.4 09/25/2021 1630   AST 15 09/25/2021 1630   ALT 13 09/25/2021 1630   ALKPHOS 51 09/25/2021 1630   BILITOT <0.2 09/25/2021 1630   GFRNONAA 122 05/13/2019 1015   GFRAA 141 05/13/2019 1015   No results found for: "CHOL", "HDL", "LDLCALC", "LDLDIRECT", "TRIG", "CHOLHDL" Lab Results  Component Value Date   HGBA1C 5.2 07/14/2019   Lab Results  Component Value Date   VITAMINB12 491 01/26/2021   Lab Results  Component Value Date   TSH 0.780 09/25/2021    PHYSICAL EXAM:  Today's Vitals   07/08/23 1555  BP: 99/62  Pulse: 69  Weight: 129 lb (  58.5 kg)  Height: 5\' 2"  (1.575 m)   Body mass index is 23.59 kg/m.   Wt Readings from Last 3 Encounters:  07/08/23 129 lb (58.5 kg)  01/07/23 133 lb (60.3 kg)  06/17/22 128 lb 9.6 oz (58.3 kg)     Ht Readings from Last 3 Encounters:  07/08/23 5\' 2"  (1.575 m)  02/13/23 5\' 2"  (1.575 m)  01/07/23 5\' 2"  (1.575 m)      General: The patient is awake, alert and appears not in acute distress. The patient is well groomed. Head: Normocephalic, atraumatic. Neck is supple.   Cardiovascular:  Regular rate and cardiac rhythm by pulse,  without distended neck veins. Respiratory: Lungs are clear to auscultation.  Skin:   Without evidence of ankle edema, or rash. Trunk: The patient's posture is erect.   NEUROLOGIC EXAM: The patient is awake and alert, oriented to place and time.    Head: Normocephalic, atraumatic. Neck is supple.  Mallampati  1,  neck circumference:13 inches . Nasal airflow patent.   Retrognathia is present- mild .  Dental status: biological , no braces.  Cardiovascular:  Regular rate and cardiac rhythm by pulse,  without distended neck veins. Respiratory: Lungs are clear to auscultation.  Skin:  Without evidence of ankle edema, or rash. Trunk: The patient's posture is not erect.    Neurologic exam : The patient is awake and alert, oriented to place and time.  avoiding eye contact.    Memory subjective described as impaired, thoughts breaking off.   Attention span & concentration ability appears restricted  Speech is fluent Mood and affect are anxious.  Racing thoughts, sometimes insomnia.    Cranial nerves: no loss of smell or taste reported  Pupils are equal and briskly reactive to light. Funduscopic exam deferred. She doesn't make eye contact.  Extraocular movements in vertical and horizontal planes were intact and without nystagmus. No Diplopia. Visual fields by finger perimetry are intact. Hearing was intact to soft voice and finger rubbing.    Facial sensation intact to fine touch.  Facial motor strength is symmetric /tongue and uvula move midline.  Neck ROM : rotation, tilt and flexion extension were normal for age and shoulder shrug was symmetrical.  Deep tendon reflexes: in the  upper and lower extremities are symmetric and intact.      ASSESSMENT AND PLAN 28 y.o. year old female  here with:    1) autism, depression, insomnia in the past.   2)  remote history  of 2 seizures for which she was placed on Lamictal- an would like to wean off.  Marland Kitchen   3) migraine ,  controlled on topamax and  ajovy.  I have no concern form the seizure standpoint to wean off Lamictal , but  it may have helped to  buffer mood disorder and even  reduce migraines to some degree- and I like the input of her behavioral health provider  before I start weaning off.   Note to :  [place] [facility]. [address].   I would like to suggest the following weaning scale the patient is currently taking 200 mg of Lamictal twice a day but needs a 400 mg dose total per day.  I would change her Lamictal 200 mg so that she can easier wean off and smaller steps and would ask her to take 100 mg in the evening and 200 mg in the morning for a month then I would like her to return to 100  mg twice a day for a second month and if she remains seizure-free we will then wean from there on for another months on 100 daily.   I plan to follow up either personally or through our NP within 6 months.   I would like to thank Carilyn Goodpasture, NP and No referring provider defined for this encounter. for allowing me to meet with and to take care of this pleasant patient.    After spending a total time of  35  minutes face to face and additional time for physical and neurologic examination, review of laboratory studies,  personal review of imaging studies, reports and results of other testing and review of referral information / records as far as provided in visit,   Electronically signed by: Melvyn Novas, MD 07/08/2023 4:25 PM  [facility] Board certified by The [employer] of Sleep Medicine and Diplomate of the [employer] of Sleep Medicine. Board certified In Neurology through the ABPN, Fellow of the [employer] of Neurology.

## 2023-07-08 NOTE — Progress Notes (Signed)
 Time: 11:00 am-11:58 am CPT Code: 16109U Diagnosis: F84.0  Jill Alexander was seen in person for therapy. She reported that she had gone back and forth about changing the appointment to virtual, but opted to meet in person at her mother's encouragement. Session focused on increased feelings of depression related to not having a job. Suicidal ideation and intent were denied, and Therapist engaged Jill Alexander in discussion of how to manage these symptoms while she continues the job search. She expressed interest in dating, and therapist suggested relaxing her standards a little in order to allow for more casual and enjoyable conversations with others online, even if she knows it will not lead to a long term relationship. She expressed interest in trying this. She is scheduled to be seen again in one week.  Intake Presenting Problem Jill Alexander presented seeking CBT to help her manage difficulties with emotion regulation and reframing negative thought patterns that contribute to anxiety and worry. Jill Alexander has a diagnosis of Autism Spectrum Disorder (ASD). Symptoms feeling nervous or on edge, difficulty controlling worry, worrying about a variety of things, difficulty relaxing, irritability, fears for the future, feelings of hopelessness, difficulty sleeping, feeling tired, appetite irregularities, poor self-image, passive suicidal ideation without plan or intent (client contracted for safety during initial session) History of Problem  Jill Alexander reported that she moved from [place] with her family in July of 2018. Since that time, she has been seen by a provider in the area for challenges with emotion regulation and anxiety related to her diagnosis of autism spectrum disorder. Jill Alexander lives with her parents. She has a 13 year-old sister who has struggled with similar issues, as well as a brother who is three years older than she is and lives in [place]. Jill Alexander described a long history of therapy and therapeutic programs in order to help  her manage these issues, including a partial hospitalization program in December of 2015.  Recent Trigger  Jill Alexander reported that she was seeking a change in pace with regard to her therapeutic treatment in order to ensure continued progress.  Marital and Family Information  Present family concerns/problems: Jill Alexander shared several accounts of disputes with her mother, including her mother becoming upset with her for sharing worries with her immediately after her mother has woken up, despite her mother's requests that she not do so.  Strengths/resources in the family/friends: Jill Alexander described overall supportive relationships with her parents.  Marital/sexual history patterns: N/A  Family of Origin  Problems in family of origin: None reported.  Family background / ethnic factors: none reported.  No needs/concerns related to ethnicity reported when asked: No  Education/Vocation  Interpersonal concerns/problems: Jill Alexander reported that she has difficulties with social interactions related to her ASD diagnosis, and often perseverates about perceived missteps in both the immediate and distant past.  Personal strengths: Jill Alexander presented as motivated to improve her situation, and with some insight as to what is effective for her.  Military/work problems/concerns: None noted.  Leisure Activities/Daily Functioning  impaired functioning  Legal Status  No Legal Problems  Medical/Nutritional Concerns  unspecified  Comments: Jill Alexander reported that she often perseverates on perceived medical problems, and described herself as a hypochondriac  Religion/Spirituality  Not reported  Other  General Behavior: cooperative  Attire: appropriate  Gait: normal  Motor Activity: normal  Stream of Thought - Productivity: spontaneous  Stream of thought - Progression: normal  Stream of thought - Language: normal  Emotional tone and reactions - Affect: appropriate  Mental trend/Content of thoughts - Perception: normal  Mental  trend/Content of thoughts - Orientation: normal  Mental trend/Content of thoughts - Memory: normal  Mental trend/Content of thoughts - General knowledge: consistent with education  Insight: good  Judgment: good  Intelligence: average  Diagnostic Summary  Autism Spectrum Disorder (F84.0)   [facility] Counselor/Therapist Progress Note   Patient ID: Mika Griffitts, MRN: [id number],     Individualized Treatment Plan Strengths: Enjoys reading, Seeking counseling, In training program through [employer], "I like humor and jokes"  Supports: Her dog, her bestfriend since childhood, her parents, her sister    Goal/Needs for Treatment:  In order of importance to patient 1) Emotional Regulation/Anxiety 2) Improve Positive self talk      Client Statement of Needs: "Find ways of being nicer to myself", cope w/ anxiety and worry    Treatment Level:Outpatient Counseling  Symptoms:Anxiety, Perseverative worry, Negative self talk  Client Treatment Preferences:weekly counseling,  Prefers to be called Jill Alexander    Healthcare consumer's goal for treatment:  Dr. Charlyne Mom will support the patient's ability to achieve the goals identified. Cognitive Behavioral Therapy, Supportive Counseling, Mindfulness and Relaxation Strategies and other evidenced-based practices will be used to promote progress towards healthy functioning.    Healthcare consumer will: Actively participate in therapy, working towards healthy functioning.     *Justification for Continuation/Discontinuation of Goal: R=Revised, O=Ongoing, A=Achieved, D=Discontinued   Goal 1) Jill Alexander will develop strategies to regulate her emotions and improve her positive self talk   Objective: Jill Alexander will incorporate her coping skills into her routine and use them more effectively and consistently   Likert rating baseline date 04/12/21: How Easy - 3; How Often - 50% Target Date Goal Was reviewed Status Code Progress towards  goal/Likert rating  05/05/2024  05/06/2023 updated Ongoing                      Goal 2: Jill Alexander will improve communication with others, especially close family members, by working on her tendency to assume the worst.   Target Date: 05/05/2024  Interventions: Therapist will help Jill Alexander to notice and disengage from maladaptive thoughts and behaviors using CBT based strategies Jill Alexander will have opportunities to process her experiences in session Therapist will engage Jill Alexander in discussion of coping strategies and creation of a plan to use them regularly Therapist will engage Jill Alexander in regular mood checks to continue to monitor her general mental health, prioritizing this at the start of the session to ensure it is covered   Therapist will provide additional resources as appropriate   Jill Alexander participated in development of tx plan and provided verbal consent.            Chrissie Noa, PhD               Chrissie Noa, PhD               Chrissie Noa, PhD               Chrissie Noa, PhD

## 2023-07-15 ENCOUNTER — Other Ambulatory Visit: Payer: Self-pay

## 2023-07-15 ENCOUNTER — Other Ambulatory Visit (HOSPITAL_COMMUNITY): Payer: Self-pay

## 2023-07-15 ENCOUNTER — Ambulatory Visit: Payer: 59 | Admitting: Clinical

## 2023-07-15 DIAGNOSIS — F840 Autistic disorder: Secondary | ICD-10-CM | POA: Diagnosis not present

## 2023-07-15 NOTE — Progress Notes (Signed)
 Time: 11:00 am-11:56 am CPT Code: [id number] Diagnosis: F84.0  [name] was seen in person for therapy. [name] reported a continued increase in depressive symptoms, which she attributed to her continued job search. She queried about the depression diagnosis, and therapist offered psychoeducation based on the DSM-5 criteria. [name] had used coping strategies of going on a walk and taking care of her pets, and agreed to continue using these and working with her job Psychologist, occupational. She is scheduled to be seen again in one week.  Intake Presenting Problem [name] presented seeking CBT to help her manage difficulties with emotion regulation and reframing negative thought patterns that contribute to anxiety and worry. [name] has a diagnosis of Autism Spectrum Disorder (ASD). Symptoms feeling nervous or on edge, difficulty controlling worry, worrying about a variety of things, difficulty relaxing, irritability, fears for the future, feelings of hopelessness, difficulty sleeping, feeling tired, appetite irregularities, poor self-image, passive suicidal ideation without plan or intent (client contracted for safety during initial session) History of Problem  [name] reported that she moved from [place] with her family in July of 2018. Since that time, she has been seen by a provider in the area for challenges with emotion regulation and anxiety related to her diagnosis of autism spectrum disorder. [name] lives with her parents. She has a 11 year-old sister who has struggled with similar issues, as well as a brother who is three years older than she is and lives in [place]. [name] described a long history of therapy and therapeutic programs in order to help her manage these issues, including a partial hospitalization program in December of 2015.  Recent Trigger  [name] reported that she was seeking a change in pace with regard to her therapeutic treatment in order to ensure continued progress.  Marital and Family Information  Present  family concerns/problems: [name] shared several accounts of disputes with her mother, including her mother becoming upset with her for sharing worries with her immediately after her mother has woken up, despite her mother's requests that she not do so.  Strengths/resources in the family/friends: [name] described overall supportive relationships with her parents.  Marital/sexual history patterns: N/A  Family of Origin  Problems in family of origin: None reported.  Family background / ethnic factors: none reported.  No needs/concerns related to ethnicity reported when asked: No  Education/Vocation  Interpersonal concerns/problems: [name] reported that she has difficulties with social interactions related to her ASD diagnosis, and often perseverates about perceived missteps in both the immediate and distant past.  Personal strengths: [name] presented as motivated to improve her situation, and with some insight as to what is effective for her.  Military/work problems/concerns: None noted.  Leisure Activities/Daily Functioning  impaired functioning  Legal Status  No Legal Problems  Medical/Nutritional Concerns  unspecified  Comments: [name] reported that she often perseverates on perceived medical problems, and described herself as a hypochondriac  Religion/Spirituality  Not reported  Other  General Behavior: cooperative  Attire: appropriate  Gait: normal  Motor Activity: normal  Stream of Thought - Productivity: spontaneous  Stream of thought - Progression: normal  Stream of thought - Language: normal  Emotional tone and reactions - Affect: appropriate  Mental trend/Content of thoughts - Perception: normal  Mental trend/Content of thoughts - Orientation: normal  Mental trend/Content of thoughts - Memory: normal  Mental trend/Content of thoughts - General knowledge: consistent with education  Insight: good  Judgment: good  Intelligence: average  Diagnostic Summary  Autism Spectrum Disorder  (F84.0)   [facility] Counselor/Therapist Progress Note   Patient ID: Candice Tobey, MRN: [id number],     Individualized Treatment Plan Strengths: Enjoys reading, Seeking counseling, In training program through [employer], "I like humor and jokes"  Supports: Her dog, her bestfriend since childhood, her parents, her sister    Goal/Needs for Treatment:  In order of importance to patient 1) Emotional Regulation/Anxiety 2) Improve Positive self talk      Client Statement of Needs: "Find ways of being nicer to myself", cope w/ anxiety and worry    Treatment Level:Outpatient Counseling  Symptoms:Anxiety, Perseverative worry, Negative self talk  Client Treatment Preferences:weekly counseling,  Prefers to be called [name]    Healthcare consumer's goal for treatment:  Dr. Charlyne Mom will support the patient's ability to achieve the goals identified. Cognitive Behavioral Therapy, Supportive Counseling, Mindfulness and Relaxation Strategies and other evidenced-based practices will be used to promote progress towards healthy functioning.    Healthcare consumer will: Actively participate in therapy, working towards healthy functioning.     *Justification for Continuation/Discontinuation of Goal: R=Revised, O=Ongoing, A=Achieved, D=Discontinued   Goal 1) [name] will develop strategies to regulate her emotions and improve her positive self talk   Objective: [name] will incorporate her coping skills into her routine and use them more effectively and consistently   Likert rating baseline date 04/12/21: How Easy - 3; How Often - 50% Target Date Goal Was reviewed Status Code Progress towards goal/Likert rating  05/05/2024  05/06/2023 updated Ongoing                      Goal 2: [name] will improve communication with others, especially close family members, by working on her tendency to assume the worst.   Target Date: 05/05/2024  Interventions: Therapist will help [name] to  notice and disengage from maladaptive thoughts and behaviors using CBT based strategies [name] will have opportunities to process her experiences in session Therapist will engage [name] in discussion of coping strategies and creation of a plan to use them regularly Therapist will engage [name] in regular mood checks to continue to monitor her general mental health, prioritizing this at the start of the session to ensure it is covered   Therapist will provide additional resources as appropriate   [name] participated in development of tx plan and provided verbal consent.         Chrissie Noa, PhD               Chrissie Noa, PhD

## 2023-07-16 ENCOUNTER — Telehealth: Payer: MEDICAID | Admitting: Physician Assistant

## 2023-07-16 DIAGNOSIS — R3989 Other symptoms and signs involving the genitourinary system: Secondary | ICD-10-CM | POA: Diagnosis not present

## 2023-07-17 ENCOUNTER — Other Ambulatory Visit (HOSPITAL_COMMUNITY): Payer: Self-pay

## 2023-07-17 MED ORDER — CEPHALEXIN 500 MG PO CAPS
500.0000 mg | ORAL_CAPSULE | Freq: Two times a day (BID) | ORAL | 0 refills | Status: AC
  Filled 2023-07-17: qty 14, 7d supply, fill #0

## 2023-07-17 NOTE — Progress Notes (Signed)
 I have spent 5 minutes in review of e-visit questionnaire, review and updating patient chart, medical decision making and response to patient.   Piedad Climes, PA-C

## 2023-07-17 NOTE — Progress Notes (Signed)

## 2023-07-22 ENCOUNTER — Ambulatory Visit (INDEPENDENT_AMBULATORY_CARE_PROVIDER_SITE_OTHER): Payer: 59 | Admitting: Clinical

## 2023-07-22 DIAGNOSIS — F840 Autistic disorder: Secondary | ICD-10-CM

## 2023-07-22 NOTE — Progress Notes (Signed)
 Time: 11:00 am-11:56 am CPT Code: [id number] Diagnosis: F84.0  Jill Alexander was seen in person for therapy. Jill Alexander expressed feelings of anger and hurt in relationship to her former job at [employer]. 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 Therapist offered validation and support, allowing her to process feelings and exploring next steps. She is scheduled to be seen again in one week.  Intake Presenting Problem Jill Alexander presented seeking CBT to help her manage difficulties with emotion regulation and reframing negative thought patterns that contribute to anxiety and worry. Jill Alexander has a diagnosis of Autism Spectrum Disorder (ASD). Symptoms feeling nervous or on edge, difficulty controlling worry, worrying about a variety of things, difficulty relaxing, irritability, fears for the future, feelings of hopelessness, difficulty sleeping, feeling tired, appetite irregularities, poor self-image, passive suicidal ideation without plan or intent (client contracted for safety during initial session) History of Problem  Jill Alexander reported that she moved from [place] with her family in July of 2018. Since that time, she has been seen by a provider in the area for challenges with emotion regulation and anxiety related to her diagnosis of autism spectrum disorder. Jill Alexander lives with her parents. She has a 33 year-old sister who has struggled with similar issues, as well as a brother who is three years older than she is and lives in [place]. Jill Alexander described a long history of therapy and therapeutic programs in order to help her manage these issues, including a partial hospitalization program in December of 2015.  Recent Trigger  Jill Alexander reported that she was seeking a change in pace with regard to her therapeutic treatment in order to ensure continued progress.  Marital and Family Information  Present family  concerns/problems: Jill Alexander shared several accounts of disputes with her mother, including her mother becoming upset with her for sharing worries with her immediately after her mother has woken up, despite her mother's requests that she not do so.  Strengths/resources in the family/friends: Jill Alexander described overall supportive relationships with her parents.  Marital/sexual history patterns: N/A  Family of Origin  Problems in family of origin: None reported.  Family background / ethnic factors: none reported.  No needs/concerns related to ethnicity reported when asked: No  Education/Vocation  Interpersonal concerns/problems: Jill Alexander reported that she has difficulties with social interactions related to her ASD diagnosis, and often perseverates about perceived missteps in both the immediate and distant past.  Personal strengths: Jill Alexander presented as motivated to improve her situation, and with some insight as to what is effective for her.  Military/work problems/concerns: None noted.  Leisure Activities/Daily Functioning  impaired functioning  Legal Status  No Legal Problems  Medical/Nutritional Concerns  unspecified  Comments: Jill Alexander reported that she often perseverates on perceived medical problems, and described herself as a hypochondriac  Religion/Spirituality  Not reported  Other  General Behavior: cooperative  Attire: appropriate  Gait: normal  Motor Activity: normal  Stream of Thought - Productivity: spontaneous  Stream of thought - Progression: normal  Stream of thought - Language: normal  Emotional tone and reactions - Affect: appropriate  Mental trend/Content of thoughts - Perception: normal  Mental trend/Content of thoughts - Orientation: normal  Mental trend/Content of thoughts - Memory: normal  Mental trend/Content of thoughts - General knowledge: consistent with education  Insight: good  Judgment: good  Intelligence: average  Diagnostic Summary  Autism Spectrum Disorder  (F84.0)   [facility] Counselor/Therapist Progress Note   Patient ID: Jill Alexander, MRN: [id number],     Individualized Treatment Plan Strengths: Enjoys reading, Seeking counseling, In training program through [employer], "I like humor and jokes"  Supports: Her dog, her bestfriend since childhood, her parents, her sister    Goal/Needs for Treatment:  In order of importance to patient 1) Emotional Regulation/Anxiety 2) Improve Positive self talk      Client Statement of Needs: "Find ways of being nicer to myself", cope w/ anxiety and worry    Treatment Level:Outpatient Counseling  Symptoms:Anxiety, Perseverative worry, Negative self talk  Client Treatment Preferences:weekly counseling,  Prefers to be called Jill Alexander    Healthcare consumer's goal for treatment:  Dr. Charlyne Mom will support the patient's ability to achieve the goals identified. Cognitive Behavioral Therapy, Supportive Counseling, Mindfulness and Relaxation Strategies and other evidenced-based practices will be used to promote progress towards healthy functioning.    Healthcare consumer will: Actively participate in therapy, working towards healthy functioning.     *Justification for Continuation/Discontinuation of Goal: R=Revised, O=Ongoing, A=Achieved, D=Discontinued   Goal 1) Jill Alexander will develop strategies to regulate her emotions and improve her positive self talk   Objective: Jill Alexander will incorporate her coping skills into her routine and use them more effectively and consistently   Likert rating baseline date 04/12/21: How Easy - 3; How Often - 50% Target Date Goal Was reviewed Status Code Progress towards goal/Likert rating  05/05/2024  05/06/2023 updated Ongoing                      Goal 2: Jill Alexander will improve communication with others, especially close family members, by working on her tendency to assume the worst.   Target Date: 05/05/2024  Interventions: Therapist will help Jill Alexander to  notice and disengage from maladaptive thoughts and behaviors using CBT based strategies Jill Alexander will have opportunities to process her experiences in session Therapist will engage Jill Alexander in discussion of coping strategies and creation of a plan to use them regularly Therapist will engage Jill Alexander in regular mood checks to continue to monitor her general mental health, prioritizing this at the start of the session to ensure it is covered   Therapist will provide additional resources as appropriate   Jill Alexander participated in development of tx plan and provided verbal consent.       Chrissie Noa, PhD               Chrissie Noa, PhD

## 2023-07-28 ENCOUNTER — Other Ambulatory Visit (HOSPITAL_COMMUNITY): Payer: Self-pay

## 2023-07-28 ENCOUNTER — Other Ambulatory Visit: Payer: Self-pay

## 2023-07-28 DIAGNOSIS — F331 Major depressive disorder, recurrent, moderate: Secondary | ICD-10-CM | POA: Diagnosis not present

## 2023-07-28 DIAGNOSIS — F840 Autistic disorder: Secondary | ICD-10-CM | POA: Diagnosis not present

## 2023-07-28 DIAGNOSIS — F900 Attention-deficit hyperactivity disorder, predominantly inattentive type: Secondary | ICD-10-CM | POA: Diagnosis not present

## 2023-07-28 DIAGNOSIS — F411 Generalized anxiety disorder: Secondary | ICD-10-CM | POA: Diagnosis not present

## 2023-07-28 MED ORDER — TOPIRAMATE 50 MG PO TABS
150.0000 mg | ORAL_TABLET | Freq: Every day | ORAL | 1 refills | Status: DC
  Filled 2023-07-28: qty 270, 90d supply, fill #0
  Filled 2023-08-07 – 2023-10-13 (×2): qty 270, 90d supply, fill #1

## 2023-07-28 MED ORDER — GUANFACINE HCL ER 1 MG PO TB24
1.0000 mg | ORAL_TABLET | Freq: Every day | ORAL | 1 refills | Status: DC
  Filled 2023-07-28: qty 30, 30d supply, fill #0
  Filled 2023-08-07 – 2023-08-21 (×3): qty 30, 30d supply, fill #1

## 2023-07-28 MED ORDER — DULOXETINE HCL 60 MG PO CPEP
60.0000 mg | ORAL_CAPSULE | Freq: Every day | ORAL | 1 refills | Status: DC
  Filled 2023-07-28 – 2023-08-19 (×4): qty 90, 90d supply, fill #0
  Filled 2024-01-18: qty 90, 90d supply, fill #1

## 2023-07-29 ENCOUNTER — Ambulatory Visit (INDEPENDENT_AMBULATORY_CARE_PROVIDER_SITE_OTHER): Payer: 59 | Admitting: Clinical

## 2023-07-29 DIAGNOSIS — F840 Autistic disorder: Secondary | ICD-10-CM

## 2023-07-29 NOTE — Progress Notes (Signed)
 Time: 11:00 am-11:56 am CPT Code: [id number] Diagnosis: F84.0  Jill Alexander was seen in person for therapy. She reported stress over the last week related to her mother abruptly having to go out of town to help with a health issue her grandmother was having. Therapist offered an opportunity to process. Jill Alexander also shared feelings of regret related to a comment she had made during her hair appointment. She shared that no one was offended by the comment and her stylist has made similar comments, but is does not align with her values. Therapist suggested taking a deep breathing before comments and trying to slow down, but also challenged thoughts that she was "wrong" or "bad" for having made the comment. She is scheduled to be seen again in one week.  Intake Presenting Problem Jill Alexander presented seeking CBT to help her manage difficulties with emotion regulation and reframing negative thought patterns that contribute to anxiety and worry. Jill Alexander has a diagnosis of Autism Spectrum Disorder (ASD). Symptoms feeling nervous or on edge, difficulty controlling worry, worrying about a variety of things, difficulty relaxing, irritability, fears for the future, feelings of hopelessness, difficulty sleeping, feeling tired, appetite irregularities, poor self-image, passive suicidal ideation without plan or intent (client contracted for safety during initial session) History of Problem  Jill Alexander reported that she moved from [place] with her family in July of 2018. Since that time, she has been seen by a provider in the area for challenges with emotion regulation and anxiety related to her diagnosis of autism spectrum disorder. Jill Alexander lives with her parents. She has a 30 year-old sister who has struggled with similar issues, as well as a brother who is three years older than she is and lives in [place]. Jill Alexander described a long history of therapy and therapeutic programs in order to help her manage these issues, including a partial  hospitalization program in December of 2015.  Recent Trigger  Jill Alexander reported that she was seeking a change in pace with regard to her therapeutic treatment in order to ensure continued progress.  Marital and Family Information  Present family concerns/problems: Jill Alexander shared several accounts of disputes with her mother, including her mother becoming upset with her for sharing worries with her immediately after her mother has woken up, despite her mother's requests that she not do so.  Strengths/resources in the family/friends: Jill Alexander described overall supportive relationships with her parents.  Marital/sexual history patterns: N/A  Family of Origin  Problems in family of origin: None reported.  Family background / ethnic factors: none reported.  No needs/concerns related to ethnicity reported when asked: No  Education/Vocation  Interpersonal concerns/problems: Jill Alexander reported that she has difficulties with social interactions related to her ASD diagnosis, and often perseverates about perceived missteps in both the immediate and distant past.  Personal strengths: Jill Alexander presented as motivated to improve her situation, and with some insight as to what is effective for her.  Military/work problems/concerns: None noted.  Leisure Activities/Daily Functioning  impaired functioning  Legal Status  No Legal Problems  Medical/Nutritional Concerns  unspecified  Comments: Jill Alexander reported that she often perseverates on perceived medical problems, and described herself as a hypochondriac  Religion/Spirituality  Not reported  Other  General Behavior: cooperative  Attire: appropriate  Gait: normal  Motor Activity: normal  Stream of Thought - Productivity: spontaneous  Stream of thought - Progression: normal  Stream of thought - Language: normal  Emotional tone and reactions - Affect: appropriate  Mental trend/Content of thoughts - Perception: normal  Mental trend/Content of  thoughts - Orientation:  normal  Mental trend/Content of thoughts - Memory: normal  Mental trend/Content of thoughts - General knowledge: consistent with education  Insight: good  Judgment: good  Intelligence: average  Diagnostic Summary  Autism Spectrum Disorder (F84.0)   [facility] Counselor/Therapist Progress Note   Patient ID: Jill Alexander, MRN: [id number],     Individualized Treatment Plan Strengths: Enjoys reading, Seeking counseling, In training program through [employer], "I like humor and jokes"  Supports: Her dog, her bestfriend since childhood, her parents, her sister    Goal/Needs for Treatment:  In order of importance to patient 1) Emotional Regulation/Anxiety 2) Improve Positive self talk      Client Statement of Needs: "Find ways of being nicer to myself", cope w/ anxiety and worry    Treatment Level:Outpatient Counseling  Symptoms:Anxiety, Perseverative worry, Negative self talk  Client Treatment Preferences:weekly counseling,  Prefers to be called Jill Alexander    Healthcare consumer's goal for treatment:  Dr. Charlyne Mom will support the patient's ability to achieve the goals identified. Cognitive Behavioral Therapy, Supportive Counseling, Mindfulness and Relaxation Strategies and other evidenced-based practices will be used to promote progress towards healthy functioning.    Healthcare consumer will: Actively participate in therapy, working towards healthy functioning.     *Justification for Continuation/Discontinuation of Goal: R=Revised, O=Ongoing, A=Achieved, D=Discontinued   Goal 1) Jill Alexander will develop strategies to regulate her emotions and improve her positive self talk   Objective: Jill Alexander will incorporate her coping skills into her routine and use them more effectively and consistently   Likert rating baseline date 04/12/21: How Easy - 3; How Often - 50% Target Date Goal Was reviewed Status Code Progress towards goal/Likert rating  05/05/2024  05/06/2023 updated  Ongoing                      Goal 2: Jill Alexander will improve communication with others, especially close family members, by working on her tendency to assume the worst.   Target Date: 05/05/2024  Interventions: Therapist will help Jill Alexander to notice and disengage from maladaptive thoughts and behaviors using CBT based strategies Jill Alexander will have opportunities to process her experiences in session Therapist will engage Jill Alexander in discussion of coping strategies and creation of a plan to use them regularly Therapist will engage Jill Alexander in regular mood checks to continue to monitor her general mental health, prioritizing this at the start of the session to ensure it is covered   Therapist will provide additional resources as appropriate   Jill Alexander participated in development of tx plan and provided verbal consent.      Chrissie Noa, PhD               Chrissie Noa, PhD

## 2023-08-05 ENCOUNTER — Ambulatory Visit (INDEPENDENT_AMBULATORY_CARE_PROVIDER_SITE_OTHER): Payer: MEDICAID | Admitting: Clinical

## 2023-08-05 DIAGNOSIS — F840 Autistic disorder: Secondary | ICD-10-CM

## 2023-08-06 ENCOUNTER — Other Ambulatory Visit (HOSPITAL_COMMUNITY): Payer: Self-pay

## 2023-08-06 ENCOUNTER — Other Ambulatory Visit: Payer: Self-pay

## 2023-08-07 ENCOUNTER — Other Ambulatory Visit: Payer: Self-pay

## 2023-08-12 ENCOUNTER — Ambulatory Visit (INDEPENDENT_AMBULATORY_CARE_PROVIDER_SITE_OTHER): Payer: 59 | Admitting: Clinical

## 2023-08-12 DIAGNOSIS — F840 Autistic disorder: Secondary | ICD-10-CM

## 2023-08-12 NOTE — Progress Notes (Signed)
 Time: 30:00 am-30:56 am CPT Code: 30109U Diagnosis: F84.0  30ddy was seen in person for therapy. She reported an increase in depressive symptoms without suicidal ideation or intent. Therapist processed this with her, pointing out the correlation between having a job and a decrease in depressive symptoms, and Jill Alexander agreed. She continues to apply for jobs 30 and had several upcoming interviews. She had also tried volunteering, and agreed to go again in the coming week. She is scheduled to be seen again in one week.  Intake Presenting Problem Jill Alexander presented seeking CBT 30 to help her manage difficulties with emotion regulation and reframing negative thought patterns that contribute to anxiety and worry. Jill Alexander has a diagnosis of Autism 30 Spectrum Disorder (ASD). Symptoms feeling nervous or on edge, difficulty controlling worry, worrying about a variety of things, difficulty relaxing, irritability, fears for the future, feelings of hopelessness, difficulty sleeping, feeling tired, appetite irregularities, poor self-image, passive suicidal ideation without plan or intent (client contracted for safety during initial session) History of Problem  Jill Alexander reported that she moved from [place] with her family in July of 2018. Since that time, she has been seen by a provider in the area for challenges with emotion regulation and anxiety related to her diagnosis of autism spectrum disorder. Jill Alexander lives with her parents. 30 She has a 74 year-old sister who has struggled with similar issues, as well as a brother who is 30 years older than she is and lives in [place]. Jill Alexander described a long history of therapy and therapeutic programs in order to help her manage these issues, including a partial hospitalization program in December of 3015.  Recent Trigger  Jill Alexander reported that she was seeking a change in pace with regard to her therapeutic treatment in order to ensure continued progress.  Marital and Family Information   Present family concerns/problems: Jill Alexander shared several accounts of disputes with her mother, including her mother becoming upset with her for sharing worries with her immediately after her mother has woken up, despite her mother's requests that she not do so.  Strengths/resources in the family/friends: Jill Alexander described overall supportive relationships with her parents.  Marital/sexual history patterns: N/A  Family of Origin  Problems in family of origin: None reported.  Family background / ethnic factors: none reported.  No needs/concerns related to ethnicity reported when asked: No  Education/Vocation  Interpersonal concerns/problems: Jill Alexander reported that she has difficulties with social interactions related to her ASD diagnosis, and often perseverates about perceived missteps in both the immediate and distant past.  Personal strengths: Jill Alexander presented as motivated to improve her situation, and with some insight as to what is effective for her.  Military/work problems/concerns: None noted.  Leisure Activities/Daily Functioning  impaired functioning  Legal Status  No Legal Problems  Medical/Nutritional Concerns  unspecified  Comments: Jill Alexander reported that she often perseverates 30 on perceived medical problems, and described herself as a hypochondriac  Religion/Spirituality  Not reported  Other  General Behavior: cooperative  Attire: appropriate  Gait: normal  Motor Activity: normal  Stream of Thought - Productivity: spontaneous  Stream of thought - Progression: normal  Stream of thought - Language: normal  Emotional tone and reactions - Affect: appropriate  Mental trend/Content of thoughts - Perception: normal  Mental trend/Content of thoughts - Orientation: normal  Mental trend/Content of thoughts - Memory: normal  Mental trend/Content of thoughts - General knowledge: consistent with education  Insight: good  Judgment: good  Intelligence: average  Diagnostic Summary  Autism  Spectrum Disorder (F84.0)   [name] Counselor/Therapist Progress Note   Patient ID: Jill Alexander, MRN: [id number],     Individualized Treatment Plan Strengths: Enjoys reading, Seeking counseling, In training program through [employer], "I like humor and jokes"  Supports: Her dog, her bestfriend since childhood, her parents, her sister    Goal/Needs for Treatment:  In order of importance to patient 1) Emotional Regulation/Anxiety 2) Improve Positive self talk      Client Statement of Needs: "Find ways of being nicer to myself", cope w/ anxiety and worry    Treatment Level:Outpatient Counseling  Symptoms:Anxiety, Perseverative worry, Negative self talk  Client Treatment Preferences:weekly counseling,  Prefers to be called Jill Alexander    Healthcare consumer's goal for treatment:  Dr. Espyn Radwan will support the patient's ability to achieve the goals identified. Cognitive Behavioral Therapy, Supportive Counseling, Mindfulness and Relaxation Strategies and other evidenced-based practices will be used to promote progress towards healthy functioning.    Healthcare consumer will: Actively participate in therapy, working towards healthy functioning.     *Justification for Continuation/Discontinuation of Goal: R=Revised, O=Ongoing, A=Achieved, D=Discontinued   Goal 1) Jill Alexander will develop strategies to regulate her emotions and improve her positive self talk   Objective: Jill Alexander will incorporate her coping skills into her routine and use them more effectively and consistently   Likert rating baseline date 04/27/21: How Easy - 3; How Often - 50% Target Date Goal Was reviewed Status Code Progress towards goal/Likert rating  05/05/2024  05/06/2023 updated Ongoing                      Goal 2: Jill Alexander will improve communication with others, especially close family members, by working on her tendency to assume the worst.   Target Date: 05/05/2028  Interventions: Therapist  will help Jill Alexander to notice and disengage from maladaptive thoughts and behaviors using CBT based strategies Jill Alexander will have opportunities to process her experiences in session Therapist will engage Jill Alexander in discussion of coping strategies and creation of a plan to use them regularly Therapist will engage Jill Alexander in regular mood checks to continue to monitor her general mental health, prioritizing this at the start of the session to ensure it is covered   Therapist will provide additional resources as appropriate   Jill Alexander participated in development of tx plan and provided verbal consent.    Lezlee Gills L Malary Aylesworth, PhD               Arlene Lacy, PhD

## 2023-08-13 NOTE — Progress Notes (Signed)
 Time: 11:00 am-11:56 am CPT Code: 40981X Diagnosis: F84.0  [name] was seen in person for therapy. She had contacted her former employer to let them know that she does not wish for them to reach out when they need additional help, and reported feeling good about this. Session focused on increased depressive mood related to difficulty finding a job. She is scheduled to be seen again in one week.  Intake Presenting Problem [name] presented seeking CBT to help her manage difficulties with emotion regulation and reframing negative thought patterns that contribute to anxiety and worry. [name] has a diagnosis of Autism Spectrum Disorder (ASD). Symptoms feeling nervous or on edge, difficulty controlling worry, worrying about a variety of things, difficulty relaxing, irritability, fears for the future, feelings of hopelessness, difficulty sleeping, feeling tired, appetite irregularities, poor self-image, passive suicidal ideation without plan or intent (client contracted for safety during initial session) History of Problem  [name] reported that she moved from [place] with her family in July of 2018. Since that time, she has been seen by a provider in the area for challenges with emotion regulation and anxiety related to her diagnosis of autism spectrum disorder. [name] lives with her parents. She has a 65 year-old sister who has struggled with similar issues, as well as a brother who is three years older than she is and lives in [place]. [name] described a long history of therapy and therapeutic programs in order to help her manage these issues, including a partial hospitalization program in December of 2015.  Recent Trigger  [name] reported that she was seeking a change in pace with regard to her therapeutic treatment in order to ensure continued progress.  Marital and Family Information  Present family concerns/problems: [name] shared several accounts of disputes with her mother, including her mother becoming  upset with her for sharing worries with her immediately after her mother has woken up, despite her mother's requests that she not do so.  Strengths/resources in the family/friends: [name] described overall supportive relationships with her parents.  Marital/sexual history patterns: N/A  Family of Origin  Problems in family of origin: None reported.  Family background / ethnic factors: none reported.  No needs/concerns related to ethnicity reported when asked: No  Education/Vocation  Interpersonal concerns/problems: [name] reported that she has difficulties with social interactions related to her ASD diagnosis, and often perseverates about perceived missteps in both the immediate and distant past.  Personal strengths: [name] presented as motivated to improve her situation, and with some insight as to what is effective for her.  Military/work problems/concerns: None noted.  Leisure Activities/Daily Functioning  impaired functioning  Legal Status  No Legal Problems  Medical/Nutritional Concerns  unspecified  Comments: [name] reported that she often perseverates on perceived medical problems, and described herself as a hypochondriac  Religion/Spirituality  Not reported  Other  General Behavior: cooperative  Attire: appropriate  Gait: normal  Motor Activity: normal  Stream of Thought - Productivity: spontaneous  Stream of thought - Progression: normal  Stream of thought - Language: normal  Emotional tone and reactions - Affect: appropriate  Mental trend/Content of thoughts - Perception: normal  Mental trend/Content of thoughts - Orientation: normal  Mental trend/Content of thoughts - Memory: normal  Mental trend/Content of thoughts - General knowledge: consistent with education  Insight: good  Judgment: good  Intelligence: average  Diagnostic Summary  Autism Spectrum Disorder (F84.0)   [facility] Counselor/Therapist Progress Note   Patient ID: Tinia Oravec, MRN:  [id number],  Individualized Treatment Plan Strengths: Enjoys reading, Seeking counseling, In training program through [employer], "I like humor and jokes"  Supports: Her dog, her bestfriend since childhood, her parents, her sister    Goal/Needs for Treatment:  In order of importance to patient 1) Emotional Regulation/Anxiety 2) Improve Positive self talk      Client Statement of Needs: "Find ways of being nicer to myself", cope w/ anxiety and worry    Treatment Level:Outpatient Counseling  Symptoms:Anxiety, Perseverative worry, Negative self talk  Client Treatment Preferences:weekly counseling,  Prefers to be called [name]    Healthcare consumer's goal for treatment:  Dr. Jeziah Kretschmer will support the patient's ability to achieve the goals identified. Cognitive Behavioral Therapy, Supportive Counseling, Mindfulness and Relaxation Strategies and other evidenced-based practices will be used to promote progress towards healthy functioning.    Healthcare consumer will: Actively participate in therapy, working towards healthy functioning.     *Justification for Continuation/Discontinuation of Goal: R=Revised, O=Ongoing, A=Achieved, D=Discontinued   Goal 1) [name] will develop strategies to regulate her emotions and improve her positive self talk   Objective: [name] will incorporate her coping skills into her routine and use them more effectively and consistently   Likert rating baseline date 04/12/21: How Easy - 3; How Often - 50% Target Date Goal Was reviewed Status Code Progress towards goal/Likert rating  05/05/2024  05/06/2023 updated Ongoing                      Goal 2: [name] will improve communication with others, especially close family members, by working on her tendency to assume the worst.   Target Date: 05/05/2024  Interventions: Therapist will help [name] to notice and disengage from maladaptive thoughts and behaviors using CBT based strategies [name] will have  opportunities to process her experiences in session Therapist will engage [name] in discussion of coping strategies and creation of a plan to use them regularly Therapist will engage [name] in regular mood checks to continue to monitor her general mental health, prioritizing this at the start of the session to ensure it is covered   Therapist will provide additional resources as appropriate   [name] participated in development of tx plan and provided verbal consent.    Nikalas Bramel L Binta Statzer, PhD               Arlene Lacy, PhD

## 2023-08-18 DIAGNOSIS — F331 Major depressive disorder, recurrent, moderate: Secondary | ICD-10-CM | POA: Diagnosis not present

## 2023-08-18 DIAGNOSIS — F411 Generalized anxiety disorder: Secondary | ICD-10-CM | POA: Diagnosis not present

## 2023-08-18 DIAGNOSIS — F840 Autistic disorder: Secondary | ICD-10-CM | POA: Diagnosis not present

## 2023-08-18 DIAGNOSIS — F900 Attention-deficit hyperactivity disorder, predominantly inattentive type: Secondary | ICD-10-CM | POA: Diagnosis not present

## 2023-08-19 ENCOUNTER — Ambulatory Visit (INDEPENDENT_AMBULATORY_CARE_PROVIDER_SITE_OTHER): Payer: 59 | Admitting: Clinical

## 2023-08-19 DIAGNOSIS — F840 Autistic disorder: Secondary | ICD-10-CM | POA: Diagnosis not present

## 2023-08-19 NOTE — Progress Notes (Signed)
 Time: 11:00 am-11:56 am CPT Code: 40981X Diagnosis: F84.0  [name] was seen in person for therapy. She had reached out via secure message to express that she may "like" being depressed, and session focused on processing this. Therapist encouraged her to consider whether depression may serve a function for her. She also reflected upon dynamics in her relationships with her parents, and the possibility that they may sometimes misinterpret each others' cues. She is scheduled to be seen again in one week.  Intake Presenting Problem [name] presented seeking CBT to help her manage difficulties with emotion regulation and reframing negative thought patterns that contribute to anxiety and worry. [name] has a diagnosis of Autism Spectrum Disorder (ASD). Symptoms feeling nervous or on edge, difficulty controlling worry, worrying about a variety of things, difficulty relaxing, irritability, fears for the future, feelings of hopelessness, difficulty sleeping, feeling tired, appetite irregularities, poor self-image, passive suicidal ideation without plan or intent (client contracted for safety during initial session) History of Problem  [name] reported that she moved from [place]  with her family in July of 2018. Since that time, she has been seen by a provider in the area for challenges with emotion regulation and anxiety related to her diagnosis of autism spectrum disorder. [name] lives with her parents. She has a 14 year-old sister who has struggled with similar issues, as well as a brother who is three years older than she is and lives in [place]. [name] described a long history of therapy and therapeutic programs in order to help her manage these issues, including a partial hospitalization program in December of 2015.  Recent Trigger  [name] reported that she was seeking a change in pace with regard to her therapeutic treatment in order to ensure continued progress.  Marital and Family Information  Present family  concerns/problems: [name] shared several accounts of disputes with her mother, including her mother becoming upset with her for sharing worries with her immediately after her mother has woken up, despite her mother's requests that she not do so.  Strengths/resources in the family/friends: [name] described overall supportive relationships with her parents.  Marital/sexual history patterns: N/A  Family of Origin  Problems in family of origin: None reported.  Family background / ethnic factors: none reported.  No needs/concerns related to ethnicity reported when asked: No  Education/Vocation  Interpersonal concerns/problems: [name] reported that she has difficulties with social interactions related to her ASD diagnosis, and often perseverates about perceived missteps in both the immediate and distant past.  Personal strengths: [name] presented as motivated to improve her situation, and with some insight as to what is effective for her.  Military/work problems/concerns: None noted.  Leisure Activities/Daily Functioning  impaired functioning  Legal Status  No Legal Problems  Medical/Nutritional Concerns  unspecified  Comments: [name] reported that she often perseverates on perceived medical problems, and described herself as a hypochondriac  Religion/Spirituality  Not reported  Other  General Behavior: cooperative  Attire: appropriate  Gait: normal  Motor Activity: normal  Stream of Thought - Productivity: spontaneous  Stream of thought - Progression: normal  Stream of thought - Language: normal  Emotional tone and reactions - Affect: appropriate  Mental trend/Content of thoughts - Perception: normal  Mental trend/Content of thoughts - Orientation: normal  Mental trend/Content of thoughts - Memory: normal  Mental trend/Content of thoughts - General knowledge: consistent with education  Insight: good  Judgment: good  Intelligence: average  Diagnostic Summary  Autism Spectrum Disorder  (F84.0)   [facility] Counselor/Therapist Progress  Note   Patient ID: Madoline Bhatt, MRN: [id number],     Individualized Treatment Plan Strengths: Enjoys reading, Seeking counseling, In training program through [employer], "I like humor and jokes"  Supports: Her dog, her bestfriend since childhood, her parents, her sister    Goal/Needs for Treatment:  In order of importance to patient 1) Emotional Regulation/Anxiety 2) Improve Positive self talk      Client Statement of Needs: "Find ways of being nicer to myself", cope w/ anxiety and worry    Treatment Level:Outpatient Counseling  Symptoms:Anxiety, Perseverative worry, Negative self talk  Client Treatment Preferences:weekly counseling,  Prefers to be called [name]    Healthcare consumer's goal for treatment:  Dr. Brette Cast will support the patient's ability to achieve the goals identified. Cognitive Behavioral Therapy, Supportive Counseling, Mindfulness and Relaxation Strategies and other evidenced-based practices will be used to promote progress towards healthy functioning.    Healthcare consumer will: Actively participate in therapy, working towards healthy functioning.     *Justification for Continuation/Discontinuation of Goal: R=Revised, O=Ongoing, A=Achieved, D=Discontinued   Goal 1) [name] will develop strategies to regulate her emotions and improve her positive self talk   Objective: [name] will incorporate her coping skills into her routine and use them more effectively and consistently   Likert rating baseline date 04/12/21: How Easy - 3; How Often - 50% Target Date Goal Was reviewed Status Code Progress towards goal/Likert rating  05/05/2024  05/06/2023 updated Ongoing                      Goal 2: [name] will improve communication with others, especially close family members, by working on her tendency to assume the worst.   Target Date: 05/05/2024  Interventions: Therapist will help [name] to  notice and disengage from maladaptive thoughts and behaviors using CBT based strategies [name] will have opportunities to process her experiences in session Therapist will engage [name] in discussion of coping strategies and creation of a plan to use them regularly Therapist will engage [name] in regular mood checks to continue to monitor her general mental health, prioritizing this at the start of the session to ensure it is covered   Therapist will provide additional resources as appropriate   [name] participated in development of tx plan and provided verbal consent.      Prudencio Velazco L Jeremie Abdelaziz, PhD               Arlene Lacy, PhD

## 2023-08-20 ENCOUNTER — Other Ambulatory Visit: Payer: Self-pay

## 2023-08-20 ENCOUNTER — Other Ambulatory Visit (HOSPITAL_COMMUNITY): Payer: Self-pay

## 2023-08-25 ENCOUNTER — Other Ambulatory Visit (HOSPITAL_COMMUNITY): Payer: Self-pay

## 2023-08-25 ENCOUNTER — Other Ambulatory Visit: Payer: Self-pay

## 2023-08-25 DIAGNOSIS — F900 Attention-deficit hyperactivity disorder, predominantly inattentive type: Secondary | ICD-10-CM | POA: Diagnosis not present

## 2023-08-25 DIAGNOSIS — F331 Major depressive disorder, recurrent, moderate: Secondary | ICD-10-CM | POA: Diagnosis not present

## 2023-08-25 DIAGNOSIS — F411 Generalized anxiety disorder: Secondary | ICD-10-CM | POA: Diagnosis not present

## 2023-08-25 DIAGNOSIS — F840 Autistic disorder: Secondary | ICD-10-CM | POA: Diagnosis not present

## 2023-08-25 MED ORDER — DULOXETINE HCL 60 MG PO CPEP
120.0000 mg | ORAL_CAPSULE | Freq: Every day | ORAL | 1 refills | Status: DC
  Filled 2023-09-29: qty 60, 30d supply, fill #0
  Filled 2023-11-04: qty 60, 30d supply, fill #1

## 2023-08-25 MED ORDER — GUANFACINE HCL ER 2 MG PO TB24
2.0000 mg | ORAL_TABLET | Freq: Every day | ORAL | 1 refills | Status: DC
  Filled 2023-08-25: qty 30, 30d supply, fill #0
  Filled 2023-09-23: qty 30, 30d supply, fill #1

## 2023-08-25 MED ORDER — LURASIDONE HCL 60 MG PO TABS
60.0000 mg | ORAL_TABLET | Freq: Every evening | ORAL | 1 refills | Status: DC
  Filled 2023-08-25: qty 30, 30d supply, fill #0
  Filled 2023-09-22: qty 30, 30d supply, fill #1

## 2023-08-26 ENCOUNTER — Ambulatory Visit (INDEPENDENT_AMBULATORY_CARE_PROVIDER_SITE_OTHER): Payer: 59 | Admitting: Clinical

## 2023-08-26 DIAGNOSIS — F840 Autistic disorder: Secondary | ICD-10-CM

## 2023-08-26 DIAGNOSIS — Z79899 Other long term (current) drug therapy: Secondary | ICD-10-CM | POA: Diagnosis not present

## 2023-08-26 NOTE — Progress Notes (Signed)
 Time: 11:00 am-11:56 am CPT Code: 27253G Diagnosis: F84.0  Jill Alexander was seen in person for therapy. She reported that she has continued to search for jobs and had had another interview. She had also tried a new volunteering opportunity, which had gone well. Session focused on her tendency toward self-criticism, which therapist pointed out during session. Therapist pointed out that she may tend to criticize herself regardless of her actual performance, and engaged her in discussion of how to take these thoughts with a grain of salt. Suicidal ideation without plan or intent was endorsed, and Jill Alexander assured the therapist that she is safe. She is scheduled to be seen again in one week.  Intake Presenting Problem Jill Alexander presented seeking CBT to help her manage difficulties with emotion regulation and reframing negative thought patterns that contribute to anxiety and worry. Jill Alexander has a diagnosis of Autism Spectrum Disorder (ASD). Symptoms feeling nervous or on edge, difficulty controlling worry, worrying about a variety of things, difficulty relaxing, irritability, fears for the future, feelings of hopelessness, difficulty sleeping, feeling tired, appetite irregularities, poor self-image, passive suicidal ideation without plan or intent (client contracted for safety during initial session) History of Problem  Jill Alexander reported that she moved from [place]  with her family in July of 2018. Since that time, she has been seen by a provider in the area for challenges with emotion regulation and anxiety related to her diagnosis of autism spectrum disorder. Jill Alexander lives with her parents. She has a 42 year-old sister who has struggled with similar issues, as well as a brother who is three years older than she is and lives in [place]. Jill Alexander described a long history of therapy and therapeutic programs in order to help her manage these issues, including a partial hospitalization program in December of 2015.  Recent Trigger   Jill Alexander reported that she was seeking a change in pace with regard to her therapeutic treatment in order to ensure continued progress.  Marital and Family Information  Present family concerns/problems: Jill Alexander shared several accounts of disputes with her mother, including her mother becoming upset with her for sharing worries with her immediately after her mother has woken up, despite her mother's requests that she not do so.  Strengths/resources in the family/friends: Jill Alexander described overall supportive relationships with her parents.  Marital/sexual history patterns: N/A  Family of Origin  Problems in family of origin: None reported.  Family background / ethnic factors: none reported.  No needs/concerns related to ethnicity reported when asked: No  Education/Vocation  Interpersonal concerns/problems: Jill Alexander reported that she has difficulties with social interactions related to her ASD diagnosis, and often perseverates about perceived missteps in both the immediate and distant past.  Personal strengths: Jill Alexander presented as motivated to improve her situation, and with some insight as to what is effective for her.  Military/work problems/concerns: None noted.  Leisure Activities/Daily Functioning  impaired functioning  Legal Status  No Legal Problems  Medical/Nutritional Concerns  unspecified  Comments: Jill Alexander reported that she often perseverates on perceived medical problems, and described herself as a hypochondriac  Religion/Spirituality  Not reported  Other  General Behavior: cooperative  Attire: appropriate  Gait: normal  Motor Activity: normal  Stream of Thought - Productivity: spontaneous  Stream of thought - Progression: normal  Stream of thought - Language: normal  Emotional tone and reactions - Affect: appropriate  Mental trend/Content of thoughts - Perception: normal  Mental trend/Content of thoughts - Orientation: normal  Mental trend/Content of thoughts - Memory: normal  Mental  trend/Content  of thoughts - General knowledge: consistent with education  Insight: good  Judgment: good  Intelligence: average  Diagnostic Summary  Autism Spectrum Disorder (F84.0)   [facility] Counselor/Therapist Progress Note   Patient ID: Jill Alexander, MRN: [id number],     Individualized Treatment Plan Strengths: Enjoys reading, Seeking counseling, In training program through [employer], "I like humor and jokes"  Supports: Her dog, her bestfriend since childhood, her parents, her sister    Goal/Needs for Treatment:  In order of importance to patient 1) Emotional Regulation/Anxiety 2) Improve Positive self talk      Client Statement of Needs: "Find ways of being nicer to myself", cope w/ anxiety and worry    Treatment Level:Outpatient Counseling  Symptoms:Anxiety, Perseverative worry, Negative self talk  Client Treatment Preferences:weekly counseling,  Prefers to be called Jill Alexander    Healthcare consumer's goal for treatment:  Dr. Kaydenn Mclear will support the patient's ability to achieve the goals identified. Cognitive Behavioral Therapy, Supportive Counseling, Mindfulness and Relaxation Strategies and other evidenced-based practices will be used to promote progress towards healthy functioning.    Healthcare consumer will: Actively participate in therapy, working towards healthy functioning.     *Justification for Continuation/Discontinuation of Goal: R=Revised, O=Ongoing, A=Achieved, D=Discontinued   Goal 1) Jill Alexander will develop strategies to regulate her emotions and improve her positive self talk   Objective: Jill Alexander will incorporate her coping skills into her routine and use them more effectively and consistently   Likert rating baseline date 04/12/21: How Easy - 3; How Often - 50% Target Date Goal Was reviewed Status Code Progress towards goal/Likert rating  05/05/2024  05/06/2023 updated Ongoing                      Goal 2: Jill Alexander will improve  communication with others, especially close family members, by working on her tendency to assume the worst.   Target Date: 05/05/2024  Interventions: Therapist will help Jill Alexander to notice and disengage from maladaptive thoughts and behaviors using CBT based strategies Jill Alexander will have opportunities to process her experiences in session Therapist will engage Jill Alexander in discussion of coping strategies and creation of a plan to use them regularly Therapist will engage Jill Alexander in regular mood checks to continue to monitor her general mental health, prioritizing this at the start of the session to ensure it is covered   Therapist will provide additional resources as appropriate   Jill Alexander participated in development of tx plan and provided verbal consent.      Kayona Foor L Niyonna Betsill, PhD               Shoichi Mielke L Khary Schaben, PhD               Arlene Lacy, PhD

## 2023-09-02 ENCOUNTER — Ambulatory Visit (INDEPENDENT_AMBULATORY_CARE_PROVIDER_SITE_OTHER): Payer: Commercial Managed Care - PPO | Admitting: Clinical

## 2023-09-02 DIAGNOSIS — F840 Autistic disorder: Secondary | ICD-10-CM

## 2023-09-02 NOTE — Progress Notes (Signed)
 Time: 11:00 am-11:58 am CPT Code: 04540J Diagnosis: F84.0  Jill Alexander was seen in person for therapy. She reported difficulty sleeping over the past week, and therapist engaged her in review and discussion of sleep hygiene, as well as how she can incorporate these strategies (such as: avoiding naps, getting out of bed but lying down and doing something restful when she can't sleep, consider using a meditation app or listening to a sleep story on YouTube). She also reported that she had learned her best friend is pregnant, and had experienced mixed emotions, which therapist processed with her. She is scheduled to be seen again in one week.  Intake Presenting Problem Jill Alexander presented seeking CBT to help her manage difficulties with emotion regulation and reframing negative thought patterns that contribute to anxiety and worry. Jill Alexander has a diagnosis of Autism Spectrum Disorder (ASD). Symptoms feeling nervous or on edge, difficulty controlling worry, worrying about a variety of things, difficulty relaxing, irritability, fears for the future, feelings of hopelessness, difficulty sleeping, feeling tired, appetite irregularities, poor self-image, passive suicidal ideation without plan or intent (client contracted for safety during initial session) History of Problem  Jill Alexander reported that she moved from [place]  with her family in July of 2018. Since that time, she has been seen by a provider in the area for challenges with emotion regulation and anxiety related to her diagnosis of autism spectrum disorder. Jill Alexander lives with her parents. She has a 64 year-old sister who has struggled with similar issues, as well as a brother who is three years older than she is and lives in [place]. Jill Alexander described a long history of therapy and therapeutic programs in order to help her manage these issues, including a partial hospitalization program in December of 2015.  Recent Trigger  Jill Alexander reported that she was seeking a change in  pace with regard to her therapeutic treatment in order to ensure continued progress.  Marital and Family Information  Present family concerns/problems: Jill Alexander shared several accounts of disputes with her mother, including her mother becoming upset with her for sharing worries with her immediately after her mother has woken up, despite her mother's requests that she not do so.  Strengths/resources in the family/friends: Jill Alexander described overall supportive relationships with her parents.  Marital/sexual history patterns: N/A  Family of Origin  Problems in family of origin: None reported.  Family background / ethnic factors: none reported.  No needs/concerns related to ethnicity reported when asked: No  Education/Vocation  Interpersonal concerns/problems: Jill Alexander reported that she has difficulties with social interactions related to her ASD diagnosis, and often perseverates about perceived missteps in both the immediate and distant past.  Personal strengths: Jill Alexander presented as motivated to improve her situation, and with some insight as to what is effective for her.  Military/work problems/concerns: None noted.  Leisure Activities/Daily Functioning  impaired functioning  Legal Status  No Legal Problems  Medical/Nutritional Concerns  unspecified  Comments: Jill Alexander reported that she often perseverates on perceived medical problems, and described herself as a hypochondriac  Religion/Spirituality  Not reported  Other  General Behavior: cooperative  Attire: appropriate  Gait: normal  Motor Activity: normal  Stream of Thought - Productivity: spontaneous  Stream of thought - Progression: normal  Stream of thought - Language: normal  Emotional tone and reactions - Affect: appropriate  Mental trend/Content of thoughts - Perception: normal  Mental trend/Content of thoughts - Orientation: normal  Mental trend/Content of thoughts - Memory: normal  Mental trend/Content of thoughts - General knowledge:  consistent  with education  Insight: good  Judgment: good  Intelligence: average  Diagnostic Summary  Autism Spectrum Disorder (F84.0)   [name] Counselor/Therapist Progress Note   Patient ID: Jill Alexander, MRN: [id number],     Individualized Treatment Plan Strengths: Enjoys reading, Seeking counseling, In training program through [employer], "I like humor and jokes"  Supports: Her dog, her bestfriend since childhood, her parents, her sister    Goal/Needs for Treatment:  In order of importance to patient 1) Emotional Regulation/Anxiety 2) Improve Positive self talk      Client Statement of Needs: "Find ways of being nicer to myself", cope w/ anxiety and worry    Treatment Level:Outpatient Counseling  Symptoms:Anxiety, Perseverative worry, Negative self talk  Client Treatment Preferences:weekly counseling,  Prefers to be called Jill Alexander    Healthcare consumer's goal for treatment:  Dr. Fulton Merry will support the patient's ability to achieve the goals identified. Cognitive Behavioral Therapy, Supportive Counseling, Mindfulness and Relaxation Strategies and other evidenced-based practices will be used to promote progress towards healthy functioning.    Healthcare consumer will: Actively participate in therapy, working towards healthy functioning.     *Justification for Continuation/Discontinuation of Goal: R=Revised, O=Ongoing, A=Achieved, D=Discontinued   Goal 1) Jill Alexander will develop strategies to regulate her emotions and improve her positive self talk   Objective: Jill Alexander will incorporate her coping skills into her routine and use them more effectively and consistently   Likert rating baseline date 04/12/21: How Easy - 3; How Often - 50% Target Date Goal Was reviewed Status Code Progress towards goal/Likert rating  05/05/2024  05/06/2023 updated Ongoing                      Goal 2: Jill Alexander will improve communication with others, especially close family  members, by working on her tendency to assume the worst.   Target Date: 05/05/2024  Interventions: Therapist will help Jill Alexander to notice and disengage from maladaptive thoughts and behaviors using CBT based strategies Jill Alexander will have opportunities to process her experiences in session Therapist will engage Jill Alexander in discussion of coping strategies and creation of a plan to use them regularly Therapist will engage Jill Alexander in regular mood checks to continue to monitor her general mental health, prioritizing this at the start of the session to ensure it is covered   Therapist will provide additional resources as appropriate   Jill Alexander participated in development of tx plan and provided verbal consent.      Aizik Reh L Jazz Rogala, PhD               Arlene Lacy, PhD

## 2023-09-09 ENCOUNTER — Ambulatory Visit (INDEPENDENT_AMBULATORY_CARE_PROVIDER_SITE_OTHER): Payer: Commercial Managed Care - PPO | Admitting: Clinical

## 2023-09-09 DIAGNOSIS — F840 Autistic disorder: Secondary | ICD-10-CM

## 2023-09-09 NOTE — Progress Notes (Signed)
 Time: 11:00 am-11:56 am CPT Code: 16109U Diagnosis: F84.0  [name] was seen in person for therapy. She reported continued difficulty with depressed mood and sleep, and shared that she has found her coping strategies less effective than usual. She also reported "getting stuck" playing on her phone, and failing to follow through on coping strategies such as going for walks. Therapist suggested app blocking apps she can try and setting a schedule so that she only uses certain apps later in the day. Therapist also engaged her in discussion of a coping plan for the next week, encouraging her to consider whom she can speak to (her siblings, her close friend). She is scheduled to be seen again in two weeks.  Intake Presenting Problem [name] presented seeking CBT to help her manage difficulties with emotion regulation and reframing negative thought patterns that contribute to anxiety and worry. [name] has a diagnosis of Autism Spectrum Disorder (ASD). Symptoms feeling nervous or on edge, difficulty controlling worry, worrying about a variety of things, difficulty relaxing, irritability, fears for the future, feelings of hopelessness, difficulty sleeping, feeling tired, appetite irregularities, poor self-image, passive suicidal ideation without plan or intent (client contracted for safety during initial session) History of Problem  [name] reported that she moved from [place]  with her family in July of 2018. Since that time, she has been seen by a provider in the area for challenges with emotion regulation and anxiety related to her diagnosis of autism spectrum disorder. [name] lives with her parents. She has a 77 year-old sister who has struggled with similar issues, as well as a brother who is three years older than she is and lives in [place]. [name] described a long history of therapy and therapeutic programs in order to help her manage these issues, including a partial hospitalization program in December of 2015.   Recent Trigger  [name] reported that she was seeking a change in pace with regard to her therapeutic treatment in order to ensure continued progress.  Marital and Family Information  Present family concerns/problems: [name] shared several accounts of disputes with her mother, including her mother becoming upset with her for sharing worries with her immediately after her mother has woken up, despite her mother's requests that she not do so.  Strengths/resources in the family/friends: [name] described overall supportive relationships with her parents.  Marital/sexual history patterns: N/A  Family of Origin  Problems in family of origin: None reported.  Family background / ethnic factors: none reported.  No needs/concerns related to ethnicity reported when asked: No  Education/Vocation  Interpersonal concerns/problems: [name] reported that she has difficulties with social interactions related to her ASD diagnosis, and often perseverates about perceived missteps in both the immediate and distant past.  Personal strengths: [name] presented as motivated to improve her situation, and with some insight as to what is effective for her.  Military/work problems/concerns: None noted.  Leisure Activities/Daily Functioning  impaired functioning  Legal Status  No Legal Problems  Medical/Nutritional Concerns  unspecified  Comments: [name] reported that she often perseverates on perceived medical problems, and described herself as a hypochondriac  Religion/Spirituality  Not reported  Other  General Behavior: cooperative  Attire: appropriate  Gait: normal  Motor Activity: normal  Stream of Thought - Productivity: spontaneous  Stream of thought - Progression: normal  Stream of thought - Language: normal  Emotional tone and reactions - Affect: appropriate  Mental trend/Content of thoughts - Perception: normal  Mental trend/Content of thoughts - Orientation: normal  Mental trend/Content of thoughts -  Memory: normal  Mental trend/Content of thoughts - General knowledge: consistent with education  Insight: good  Judgment: good  Intelligence: average  Diagnostic Summary  Autism Spectrum Disorder (F84.0)   [facility] Counselor/Therapist Progress Note   Patient ID: Tessalyn Dest, MRN: [id number],     Individualized Treatment Plan Strengths: Enjoys reading, Seeking counseling, In training program through [employer], "I like humor and jokes"  Supports: Her dog, her bestfriend since childhood, her parents, her sister    Goal/Needs for Treatment:  In order of importance to patient 1) Emotional Regulation/Anxiety 2) Improve Positive self talk      Client Statement of Needs: "Find ways of being nicer to myself", cope w/ anxiety and worry    Treatment Level:Outpatient Counseling  Symptoms:Anxiety, Perseverative worry, Negative self talk  Client Treatment Preferences:weekly counseling,  Prefers to be called [name]    Healthcare consumer's goal for treatment:  Dr. Mande Auvil will support the patient's ability to achieve the goals identified. Cognitive Behavioral Therapy, Supportive Counseling, Mindfulness and Relaxation Strategies and other evidenced-based practices will be used to promote progress towards healthy functioning.    Healthcare consumer will: Actively participate in therapy, working towards healthy functioning.     *Justification for Continuation/Discontinuation of Goal: R=Revised, O=Ongoing, A=Achieved, D=Discontinued   Goal 1) [name] will develop strategies to regulate her emotions and improve her positive self talk   Objective: [name] will incorporate her coping skills into her routine and use them more effectively and consistently   Likert rating baseline date 04/12/21: How Easy - 3; How Often - 50% Target Date Goal Was reviewed Status Code Progress towards goal/Likert rating  05/05/2024  05/06/2023 updated Ongoing                      Goal 2:  [name] will improve communication with others, especially close family members, by working on her tendency to assume the worst.   Target Date: 05/05/2024  Interventions: Therapist will help [name] to notice and disengage from maladaptive thoughts and behaviors using CBT based strategies [name] will have opportunities to process her experiences in session Therapist will engage [name] in discussion of coping strategies and creation of a plan to use them regularly Therapist will engage [name] in regular mood checks to continue to monitor her general mental health, prioritizing this at the start of the session to ensure it is covered   Therapist will provide additional resources as appropriate   [name] participated in development of tx plan and provided verbal consent.     Makaya Juneau L Jatasia Gundrum, PhD               Arlene Lacy, PhD

## 2023-09-12 DIAGNOSIS — F331 Major depressive disorder, recurrent, moderate: Secondary | ICD-10-CM | POA: Diagnosis not present

## 2023-09-16 ENCOUNTER — Encounter (HOSPITAL_COMMUNITY): Payer: Self-pay

## 2023-09-16 ENCOUNTER — Telehealth: Payer: Self-pay

## 2023-09-16 ENCOUNTER — Other Ambulatory Visit: Payer: Self-pay

## 2023-09-16 ENCOUNTER — Other Ambulatory Visit (HOSPITAL_COMMUNITY): Payer: Self-pay

## 2023-09-16 DIAGNOSIS — Z3041 Encounter for surveillance of contraceptive pills: Secondary | ICD-10-CM

## 2023-09-16 MED ORDER — DROSPIRENONE-ETHINYL ESTRADIOL 3-0.02 MG PO TABS
1.0000 | ORAL_TABLET | Freq: Every day | ORAL | 4 refills | Status: DC
  Filled 2023-09-16: qty 28, 28d supply, fill #0
  Filled 2023-10-20: qty 28, 28d supply, fill #1
  Filled 2023-11-15: qty 28, 28d supply, fill #2
  Filled 2023-12-14: qty 28, 28d supply, fill #3
  Filled 2024-01-12: qty 28, 28d supply, fill #4

## 2023-09-16 NOTE — Telephone Encounter (Signed)
 RN completed follow up call with patient regarding refill request of birth control. Per chart review, birth control had been discontinued in March per patient request. Patient reported she had been taking the medication continually since it was prescribed with no issues. RN contacted NP for approval.  Evelia Hipp, RN

## 2023-09-22 ENCOUNTER — Other Ambulatory Visit (HOSPITAL_COMMUNITY): Payer: Self-pay

## 2023-09-23 ENCOUNTER — Other Ambulatory Visit (HOSPITAL_COMMUNITY): Payer: Self-pay

## 2023-09-23 ENCOUNTER — Ambulatory Visit (INDEPENDENT_AMBULATORY_CARE_PROVIDER_SITE_OTHER): Payer: Commercial Managed Care - PPO | Admitting: Clinical

## 2023-09-23 ENCOUNTER — Other Ambulatory Visit: Payer: Self-pay

## 2023-09-23 DIAGNOSIS — F840 Autistic disorder: Secondary | ICD-10-CM | POA: Diagnosis not present

## 2023-09-23 MED ORDER — LAMOTRIGINE 200 MG PO TABS
400.0000 mg | ORAL_TABLET | Freq: Every day | ORAL | 1 refills | Status: AC
  Filled 2023-09-23 – 2023-11-30 (×3): qty 180, 90d supply, fill #0

## 2023-09-23 NOTE — Progress Notes (Signed)
 Time: 11:00 am-11:58 am CPT Code: 09811B Diagnosis: F84.0  Jill Alexander was seen in person for therapy. She expressed a desire to discuss her difficulty following through on activities that she knows are good for her. Therapist processed this with her, offering psychoeducation on depression and pointing out that, even for activities she is looking forward to, she may experience a feeling of hesitation or anxiety right before that triggers an urge to avoid the event. Therapist suggested recognizing this urge as a facet of depression in order to help push herself through it to the activity. Therapist also offered several resources for group therapy upon request. She is scheduled to be seen again in one week.  Intake Presenting Problem Jill Alexander presented seeking CBT to help her manage difficulties with emotion regulation and reframing negative thought patterns that contribute to anxiety and worry. Jill Alexander has a diagnosis of Autism Spectrum Disorder (ASD). Symptoms feeling nervous or on edge, difficulty controlling worry, worrying about a variety of things, difficulty relaxing, irritability, fears for the future, feelings of hopelessness, difficulty sleeping, feeling tired, appetite irregularities, poor self-image, passive suicidal ideation without plan or intent (client contracted for safety during initial session) History of Problem  Jill Alexander reported that she moved from [place]  with her family in July of 2018. Since that time, she has been seen by a provider in the area for challenges with emotion regulation and anxiety related to her diagnosis of autism spectrum disorder. Jill Alexander lives with her parents. She has a 30 year-old sister who has struggled with similar issues, as well as a brother who is three years older than she is and lives in [place]. Jill Alexander described a long history of therapy and therapeutic programs in order to help her manage these issues, including a partial hospitalization program in December of 2015.   Recent Trigger  Jill Alexander reported that she was seeking a change in pace with regard to her therapeutic treatment in order to ensure continued progress.  Marital and Family Information  Present family concerns/problems: Jill Alexander shared several accounts of disputes with her mother, including her mother becoming upset with her for sharing worries with her immediately after her mother has woken up, despite her mother's requests that she not do so.  Strengths/resources in the family/friends: Jill Alexander described overall supportive relationships with her parents.  Marital/sexual history patterns: N/A  Family of Origin  Problems in family of origin: None reported.  Family background / ethnic factors: none reported.  No needs/concerns related to ethnicity reported when asked: No  Education/Vocation  Interpersonal concerns/problems: Jill Alexander reported that she has difficulties with social interactions related to her ASD diagnosis, and often perseverates about perceived missteps in both the immediate and distant past.  Personal strengths: Jill Alexander presented as motivated to improve her situation, and with some insight as to what is effective for her.  Military/work problems/concerns: None noted.  Leisure Activities/Daily Functioning  impaired functioning  Legal Status  No Legal Problems  Medical/Nutritional Concerns  unspecified  Comments: Jill Alexander reported that she often perseverates on perceived medical problems, and described herself as a hypochondriac  Religion/Spirituality  Not reported  Other  General Behavior: cooperative  Attire: appropriate  Gait: normal  Motor Activity: normal  Stream of Thought - Productivity: spontaneous  Stream of thought - Progression: normal  Stream of thought - Language: normal  Emotional tone and reactions - Affect: appropriate  Mental trend/Content of thoughts - Perception: normal  Mental trend/Content of thoughts - Orientation: normal  Mental trend/Content of thoughts -  Memory: normal  Mental trend/Content of thoughts - General knowledge: consistent with education  Insight: good  Judgment: good  Intelligence: average  Diagnostic Summary  Autism Spectrum Disorder (F84.0)   [facility] Counselor/Therapist Progress Note   Patient ID: Jill Alexander, MRN: [id number],     Individualized Treatment Plan Strengths: Enjoys reading, Seeking counseling, In training program through [employer], "I like humor and jokes"  Supports: Her dog, her bestfriend since childhood, her parents, her sister    Goal/Needs for Treatment:  In order of importance to patient 1) Emotional Regulation/Anxiety 2) Improve Positive self talk      Client Statement of Needs: "Find ways of being nicer to myself", cope w/ anxiety and worry    Treatment Level:Outpatient Counseling  Symptoms:Anxiety, Perseverative worry, Negative self talk  Client Treatment Preferences:weekly counseling,  Prefers to be called Jill Alexander    Healthcare consumer's goal for treatment:  Dr. Ethel Veronica will support the patient's ability to achieve the goals identified. Cognitive Behavioral Therapy, Supportive Counseling, Mindfulness and Relaxation Strategies and other evidenced-based practices will be used to promote progress towards healthy functioning.    Healthcare consumer will: Actively participate in therapy, working towards healthy functioning.     *Justification for Continuation/Discontinuation of Goal: R=Revised, O=Ongoing, A=Achieved, D=Discontinued   Goal 1) Jill Alexander will develop strategies to regulate her emotions and improve her positive self talk   Objective: Jill Alexander will incorporate her coping skills into her routine and use them more effectively and consistently   Likert rating baseline date 04/12/21: How Easy - 3; How Often - 50% Target Date Goal Was reviewed Status Code Progress towards goal/Likert rating  05/05/2024  05/06/2023 updated Ongoing                      Goal 2:  Jill Alexander will improve communication with others, especially close family members, by working on her tendency to assume the worst.   Target Date: 05/05/2024  Interventions: Therapist will help Jill Alexander to notice and disengage from maladaptive thoughts and behaviors using CBT based strategies Jill Alexander will have opportunities to process her experiences in session Therapist will engage Jill Alexander in discussion of coping strategies and creation of a plan to use them regularly Therapist will engage Jill Alexander in regular mood checks to continue to monitor her general mental health, prioritizing this at the start of the session to ensure it is covered   Therapist will provide additional resources as appropriate   Jill Alexander participated in development of tx plan and provided verbal consent.     Kalani Baray L Yacqub Baston, PhD               Arlene Lacy, PhD

## 2023-09-24 ENCOUNTER — Other Ambulatory Visit: Payer: Self-pay

## 2023-09-25 ENCOUNTER — Other Ambulatory Visit (HOSPITAL_COMMUNITY): Payer: Self-pay

## 2023-09-25 DIAGNOSIS — F840 Autistic disorder: Secondary | ICD-10-CM | POA: Diagnosis not present

## 2023-09-25 DIAGNOSIS — F900 Attention-deficit hyperactivity disorder, predominantly inattentive type: Secondary | ICD-10-CM | POA: Diagnosis not present

## 2023-09-25 DIAGNOSIS — F411 Generalized anxiety disorder: Secondary | ICD-10-CM | POA: Diagnosis not present

## 2023-09-25 DIAGNOSIS — F331 Major depressive disorder, recurrent, moderate: Secondary | ICD-10-CM | POA: Diagnosis not present

## 2023-09-25 MED ORDER — LURASIDONE HCL 60 MG PO TABS
60.0000 mg | ORAL_TABLET | Freq: Every evening | ORAL | 1 refills | Status: DC
  Filled 2023-09-25 – 2023-10-20 (×2): qty 90, 90d supply, fill #0
  Filled 2023-11-15 – 2024-01-25 (×3): qty 90, 90d supply, fill #1

## 2023-09-25 MED ORDER — GUANFACINE HCL ER 2 MG PO TB24
2.0000 mg | ORAL_TABLET | Freq: Every day | ORAL | 1 refills | Status: DC
  Filled 2023-09-25 – 2023-10-20 (×2): qty 90, 90d supply, fill #0
  Filled 2023-11-15: qty 90, 90d supply, fill #1

## 2023-09-29 ENCOUNTER — Other Ambulatory Visit (HOSPITAL_COMMUNITY): Payer: Self-pay

## 2023-09-29 ENCOUNTER — Other Ambulatory Visit: Payer: Self-pay

## 2023-09-29 DIAGNOSIS — F411 Generalized anxiety disorder: Secondary | ICD-10-CM | POA: Diagnosis not present

## 2023-09-29 DIAGNOSIS — F331 Major depressive disorder, recurrent, moderate: Secondary | ICD-10-CM | POA: Diagnosis not present

## 2023-09-29 MED ORDER — LORAZEPAM 1 MG PO TABS
1.0000 mg | ORAL_TABLET | Freq: Two times a day (BID) | ORAL | 1 refills | Status: DC | PRN
  Filled 2023-09-29: qty 60, 30d supply, fill #0
  Filled 2023-11-06: qty 60, 30d supply, fill #1

## 2023-09-30 ENCOUNTER — Ambulatory Visit (INDEPENDENT_AMBULATORY_CARE_PROVIDER_SITE_OTHER): Admitting: Clinical

## 2023-09-30 DIAGNOSIS — F840 Autistic disorder: Secondary | ICD-10-CM

## 2023-09-30 NOTE — Progress Notes (Signed)
 Time: 11:00 am-11:58 am CPT Code: 35361W Diagnosis: F84.0  Jill Alexander was seen in person for therapy. She had reached out to express concern about her enjoyment of sexually explicit fanfiction, some of which centers on characters from [employer]. Therapist processed this with her, offering psychoeducation on internet safety. She is scheduled to be seen again in one week.  Intake Presenting Problem Jill Alexander presented seeking CBT to help her manage difficulties with emotion regulation and reframing negative thought patterns that contribute to anxiety and worry. Jill Alexander has a diagnosis of Autism Spectrum Disorder (ASD). Symptoms feeling nervous or on edge, difficulty controlling worry, worrying about a variety of things, difficulty relaxing, irritability, fears for the future, feelings of hopelessness, difficulty sleeping, feeling tired, appetite irregularities, poor self-image, passive suicidal ideation without plan or intent (client contracted for safety during initial session) History of Problem  Jill Alexander reported that she moved from [place]  with her family in July of 2018. Since that time, she has been seen by a provider in the area for challenges with emotion regulation and anxiety related to her diagnosis of autism spectrum disorder. Jill Alexander lives with her parents. She has a 8 year-old sister who has struggled with similar issues, as well as a brother who is three years older than she is and lives in [place]. Jill Alexander described a long history of therapy and therapeutic programs in order to help her manage these issues, including a partial hospitalization program in December of 2015.  Recent Trigger  Jill Alexander reported that she was seeking a change in pace with regard to her therapeutic treatment in order to ensure continued progress.  Marital and Family Information  Present family concerns/problems: Jill Alexander shared several accounts of disputes with her mother, including her mother becoming upset with her for sharing  worries with her immediately after her mother has woken up, despite her mother's requests that she not do so.  Strengths/resources in the family/friends: Jill Alexander described overall supportive relationships with her parents.  Marital/sexual history patterns: N/A  Family of Origin  Problems in family of origin: None reported.  Family background / ethnic factors: none reported.  No needs/concerns related to ethnicity reported when asked: No  Education/Vocation  Interpersonal concerns/problems: Jill Alexander reported that she has difficulties with social interactions related to her ASD diagnosis, and often perseverates about perceived missteps in both the immediate and distant past.  Personal strengths: Jill Alexander presented as motivated to improve her situation, and with some insight as to what is effective for her.  Military/work problems/concerns: None noted.  Leisure Activities/Daily Functioning  impaired functioning  Legal Status  No Legal Problems  Medical/Nutritional Concerns  unspecified  Comments: Jill Alexander reported that she often perseverates on perceived medical problems, and described herself as a hypochondriac  Religion/Spirituality  Not reported  Other  General Behavior: cooperative  Attire: appropriate  Gait: normal  Motor Activity: normal  Stream of Thought - Productivity: spontaneous  Stream of thought - Progression: normal  Stream of thought - Language: normal  Emotional tone and reactions - Affect: appropriate  Mental trend/Content of thoughts - Perception: normal  Mental trend/Content of thoughts - Orientation: normal  Mental trend/Content of thoughts - Memory: normal  Mental trend/Content of thoughts - General knowledge: consistent with education  Insight: good  Judgment: good  Intelligence: average  Diagnostic Summary  Autism Spectrum Disorder (F84.0)    [facility] Counselor/Therapist Progress Note   Patient ID: Jill Alexander, MRN: [id number],      Individualized Treatment Plan Strengths: Enjoys reading, Seeking counseling, In  training program through [employer], "I like humor and jokes"  Supports: Her dog, her bestfriend since childhood, her parents, her sister    Goal/Needs for Treatment:  In order of importance to patient 1) Emotional Regulation/Anxiety 2) Improve Positive self talk      Client Statement of Needs: "Find ways of being nicer to myself", cope w/ anxiety and worry    Treatment Level:Outpatient Counseling  Symptoms:Anxiety, Perseverative worry, Negative self talk  Client Treatment Preferences:weekly counseling,  Prefers to be called Jill Alexander    Healthcare consumer's goal for treatment:  Dr. Juaquina Machnik will support the patient's ability to achieve the goals identified. Cognitive Behavioral Therapy, Supportive Counseling, Mindfulness and Relaxation Strategies and other evidenced-based practices will be used to promote progress towards healthy functioning.    Healthcare consumer will: Actively participate in therapy, working towards healthy functioning.     *Justification for Continuation/Discontinuation of Goal: R=Revised, O=Ongoing, A=Achieved, D=Discontinued   Goal 1) Jill Alexander will develop strategies to regulate her emotions and improve her positive self talk   Objective: Jill Alexander will incorporate her coping skills into her routine and use them more effectively and consistently   Likert rating baseline date 04/12/21: How Easy - 3; How Often - 50% Target Date Goal Was reviewed Status Code Progress towards goal/Likert rating  05/05/2024  05/06/2023 updated Ongoing                      Goal 2: Jill Alexander will improve communication with others, especially close family members, by working on her tendency to assume the worst.   Target Date: 05/05/2024  Interventions: Therapist will help Jill Alexander to notice and disengage from maladaptive thoughts and behaviors using CBT based strategies Jill Alexander will have opportunities to process  her experiences in session Therapist will engage Jill Alexander in discussion of coping strategies and creation of a plan to use them regularly Therapist will engage Jill Alexander in regular mood checks to continue to monitor her general mental health, prioritizing this at the start of the session to ensure it is covered   Therapist will provide additional resources as appropriate   Jill Alexander participated in development of tx plan and provided verbal consent.     Laurice Iglesia L Rikia Sukhu, PhD               Arlene Lacy, PhD

## 2023-10-07 ENCOUNTER — Ambulatory Visit (INDEPENDENT_AMBULATORY_CARE_PROVIDER_SITE_OTHER): Admitting: Clinical

## 2023-10-07 ENCOUNTER — Other Ambulatory Visit (HOSPITAL_COMMUNITY): Payer: Self-pay

## 2023-10-07 DIAGNOSIS — F840 Autistic disorder: Secondary | ICD-10-CM

## 2023-10-07 NOTE — Progress Notes (Signed)
 Time: 11:00 am-11:57 am CPT Code: 16109U Diagnosis: F84.0  Jill Alexander was seen in person for therapy. She had reached out to the therapist expressing an increase in suicidal ideation and depressed mood. She disclosed suicidal ideation of cutting or hanging herself, without intent. She called her father during session to disclose these thoughts and ask that her parents help to keep her from means of doing so. She also filled out an interest form for a support group through the [employer]. She engaged in creation of a coping plan and assured the therapist she would be safe. She is scheduled to be seen again in two days.  Coping Plan 10/07/2023 Watch something funny on TV Listen to a podcast Pet the kitties  Reading some fanfiction Eat an apple (or banana) Tell your parents Go to the emergency room   Intake Presenting Problem Jill Alexander presented seeking CBT to help her manage difficulties with emotion regulation and reframing negative thought patterns that contribute to anxiety and worry. Jill Alexander has a diagnosis of Autism Spectrum Disorder (ASD). Symptoms feeling nervous or on edge, difficulty controlling worry, worrying about a variety of things, difficulty relaxing, irritability, fears for the future, feelings of hopelessness, difficulty sleeping, feeling tired, appetite irregularities, poor self-image, passive suicidal ideation without plan or intent (client contracted for safety during initial session) History of Problem  Jill Alexander reported that she moved from [place]  with her family in July of 2018. Since that time, she has been seen by a provider in the area for challenges with emotion regulation and anxiety related to her diagnosis of autism spectrum disorder. Jill Alexander lives with her parents. She has a 30 year-old sister who has struggled with similar issues, as well as a brother who is three years older than she is and lives in [place]. Jill Alexander described a long history of therapy and therapeutic  programs in order to help her manage these issues, including a partial hospitalization program in December of 2015.  Recent Trigger  Jill Alexander reported that she was seeking a change in pace with regard to her therapeutic treatment in order to ensure continued progress.  Marital and Family Information  Present family concerns/problems: Jill Alexander shared several accounts of disputes with her mother, including her mother becoming upset with her for sharing worries with her immediately after her mother has woken up, despite her mother's requests that she not do so.  Strengths/resources in the family/friends: Jill Alexander described overall supportive relationships with her parents.  Marital/sexual history patterns: N/A  Family of Origin  Problems in family of origin: None reported.  Family background / ethnic factors: none reported.  No needs/concerns related to ethnicity reported when asked: No  Education/Vocation  Interpersonal concerns/problems: Jill Alexander reported that she has difficulties with social interactions related to her ASD diagnosis, and often perseverates about perceived missteps in both the immediate and distant past.  Personal strengths: Jill Alexander presented as motivated to improve her situation, and with some insight as to what is effective for her.  Military/work problems/concerns: None noted.  Leisure Activities/Daily Functioning  impaired functioning  Legal Status  No Legal Problems  Medical/Nutritional Concerns  unspecified  Comments: Jill Alexander reported that she often perseverates on perceived medical problems, and described herself as a hypochondriac  Religion/Spirituality  Not reported  Other  General Behavior: cooperative  Attire: appropriate  Gait: normal  Motor Activity: normal  Stream of Thought - Productivity: spontaneous  Stream of thought - Progression: normal  Stream of thought - Language: normal  Emotional tone and reactions - Affect: appropriate  Mental trend/Content of thoughts -  Perception: normal  Mental trend/Content of thoughts - Orientation: normal  Mental trend/Content of thoughts - Memory: normal  Mental trend/Content of thoughts - General knowledge: consistent with education  Insight: good  Judgment: good  Intelligence: average  Diagnostic Summary  Autism Spectrum Disorder (F84.0)   [facility] Counselor/Therapist Progress Note   Patient ID: Jill Alexander, MRN: [id number],     Individualized Treatment Plan Strengths: Enjoys reading, Seeking counseling, In training program through [employer], "I like humor and jokes"  Supports: Her dog, her bestfriend since childhood, her parents, her sister    Goal/Needs for Treatment:  In order of importance to patient 1) Emotional Regulation/Anxiety 2) Improve Positive self talk      Client Statement of Needs: "Find ways of being nicer to myself", cope w/ anxiety and worry    Treatment Level:Outpatient Counseling  Symptoms:Anxiety, Perseverative worry, Negative self talk  Client Treatment Preferences:weekly counseling,  Prefers to be called Jill Alexander    Healthcare consumer's goal for treatment:  Dr. Boysie Bonebrake will support the patient's ability to achieve the goals identified. Cognitive Behavioral Therapy, Supportive Counseling, Mindfulness and Relaxation Strategies and other evidenced-based practices will be used to promote progress towards healthy functioning.    Healthcare consumer will: Actively participate in therapy, working towards healthy functioning.     *Justification for Continuation/Discontinuation of Goal: R=Revised, O=Ongoing, A=Achieved, D=Discontinued   Goal 1) Jill Alexander will develop strategies to regulate her emotions and improve her positive self talk   Objective: Jill Alexander will incorporate her coping skills into her routine and use them more effectively and consistently   Likert rating baseline date 04/12/21: How Easy - 3; How Often - 50% Target Date Goal Was reviewed Status  Code Progress towards goal/Likert rating  05/05/2024  05/06/2023 updated Ongoing                      Goal 2: Jill Alexander will improve communication with others, especially close family members, by working on her tendency to assume the worst.   Target Date: 05/05/2024  Interventions: Therapist will help Jill Alexander to notice and disengage from maladaptive thoughts and behaviors using CBT based strategies Jill Alexander will have opportunities to process her experiences in session Therapist will engage Jill Alexander in discussion of coping strategies and creation of a plan to use them regularly Therapist will engage Jill Alexander in regular mood checks to continue to monitor her general mental health, prioritizing this at the start of the session to ensure it is covered   Therapist will provide additional resources as appropriate   Jill Alexander participated in development of tx plan and provided verbal consent.     Dorella Laster L Dimetri Armitage, PhD               Arlene Lacy, PhD

## 2023-10-08 ENCOUNTER — Telehealth: Payer: MEDICAID | Admitting: Physician Assistant

## 2023-10-08 ENCOUNTER — Other Ambulatory Visit (HOSPITAL_COMMUNITY): Payer: Self-pay

## 2023-10-08 DIAGNOSIS — B9689 Other specified bacterial agents as the cause of diseases classified elsewhere: Secondary | ICD-10-CM | POA: Diagnosis not present

## 2023-10-08 DIAGNOSIS — J019 Acute sinusitis, unspecified: Secondary | ICD-10-CM | POA: Diagnosis not present

## 2023-10-08 MED ORDER — AMOXICILLIN-POT CLAVULANATE 875-125 MG PO TABS
1.0000 | ORAL_TABLET | Freq: Two times a day (BID) | ORAL | 0 refills | Status: DC
  Filled 2023-10-08: qty 14, 7d supply, fill #0

## 2023-10-08 MED ORDER — BENZONATATE 100 MG PO CAPS
100.0000 mg | ORAL_CAPSULE | Freq: Three times a day (TID) | ORAL | 0 refills | Status: DC | PRN
  Filled 2023-10-08: qty 30, 5d supply, fill #0

## 2023-10-08 NOTE — Progress Notes (Signed)

## 2023-10-09 ENCOUNTER — Ambulatory Visit: Admitting: Clinical

## 2023-10-09 ENCOUNTER — Other Ambulatory Visit (HOSPITAL_COMMUNITY): Payer: Self-pay

## 2023-10-09 DIAGNOSIS — F840 Autistic disorder: Secondary | ICD-10-CM | POA: Diagnosis not present

## 2023-10-09 NOTE — Progress Notes (Signed)
 Time: 2:00 pm-2:57 pm CPT Code: 16109U Diagnosis: F84.0  [name] was seen in person for therapy, accompanied by her father. She provided written authorization for the therapist to communicate directly with both of her parents. [name] reported that she had completed a phone intake for the [facility] program, and has plans to start on 6/23. She expressed optimism about the program. Session included reviewing her coping plan with her father, and [name] expressing ways in which her family could support her (by giving her chances to talk, as well as hugs and more frequent check-ins). She assured the therapist that she will be safe, and is scheduled to be seen on Tuesday. She and her father agreed to reach out if she needs to be seen sooner, and to go to the hospital if suicidal ideation worsens.   Coping Plan 10/09/2023 Watch something funny on TV Listen to a podcast Pet the kitties  Reading some fanfiction Eat an apple (or banana) Eat a piece of chocolate Go for a walk Ask for a hug Read a book or listen to an [employer] a friend (talk to your parents, siblings, a friend) Tell your parents Go to the emergency room  Intake Presenting Problem [name] presented seeking CBT to help her manage difficulties with emotion regulation and reframing negative thought patterns that contribute to anxiety and worry. [name] has a diagnosis of Autism Spectrum Disorder (ASD). Symptoms feeling nervous or on edge, difficulty controlling worry, worrying about a variety of things, difficulty relaxing, irritability, fears for the future, feelings of hopelessness, difficulty sleeping, feeling tired, appetite irregularities, poor self-image, passive suicidal ideation without plan or intent (client contracted for safety during initial session) History of Problem  [name] reported that she moved from [place]  with her family in July of 2018. Since that time, she has been seen by a provider in the area  for challenges with emotion regulation and anxiety related to her diagnosis of autism spectrum disorder. [name] lives with her parents. She has a 58 year-old sister who has struggled with similar issues, as well as a brother who is three years older than she is and lives in [place]. [name] described a long history of therapy and therapeutic programs in order to help her manage these issues, including a partial hospitalization program in December of 2015.  Recent Trigger  [name] reported that she was seeking a change in pace with regard to her therapeutic treatment in order to ensure continued progress.  Marital and Family Information  Present family concerns/problems: [name] shared several accounts of disputes with her mother, including her mother becoming upset with her for sharing worries with her immediately after her mother has woken up, despite her mother's requests that she not do so.  Strengths/resources in the family/friends: [name] described overall supportive relationships with her parents.  Marital/sexual history patterns: N/A  Family of Origin  Problems in family of origin: None reported.  Family background / ethnic factors: none reported.  No needs/concerns related to ethnicity reported when asked: No  Education/Vocation  Interpersonal concerns/problems: [name] reported that she has difficulties with social interactions related to her ASD diagnosis, and often perseverates about perceived missteps in both the immediate and distant past.  Personal strengths: [name] presented as motivated to improve her situation, and with some insight as to what is effective for her.  Military/work problems/concerns: None noted.  Leisure Activities/Daily Functioning  impaired functioning  Legal Status  No Legal Problems  Medical/Nutritional Concerns  unspecified  Comments: [name] reported that  she often perseverates on perceived medical problems, and described herself as a hypochondriac   Religion/Spirituality  Not reported  Other  General Behavior: cooperative  Attire: appropriate  Gait: normal  Motor Activity: normal  Stream of Thought - Productivity: spontaneous  Stream of thought - Progression: normal  Stream of thought - Language: normal  Emotional tone and reactions - Affect: appropriate  Mental trend/Content of thoughts - Perception: normal  Mental trend/Content of thoughts - Orientation: normal  Mental trend/Content of thoughts - Memory: normal  Mental trend/Content of thoughts - General knowledge: consistent with education  Insight: good  Judgment: good  Intelligence: average  Diagnostic Summary  Autism Spectrum Disorder (F84.0)   [facility] Counselor/Therapist Progress Note   Patient ID: Krystalynn Ridgeway, MRN: [id number],     Individualized Treatment Plan Strengths: Enjoys reading, Seeking counseling, In training program through [employer], I like humor and jokes  Supports: Her dog, her bestfriend since childhood, her parents, her sister    Goal/Needs for Treatment:  In order of importance to patient 1) Emotional Regulation/Anxiety 2) Improve Positive self talk      Client Statement of Needs: Find ways of being nicer to myself, cope w/ anxiety and worry    Treatment Level:Outpatient Counseling  Symptoms:Anxiety, Perseverative worry, Negative self talk  Client Treatment Preferences:weekly counseling,  Prefers to be called [name]    Healthcare consumer's goal for treatment:  Dr. Tetsuo Coppola will support the patient's ability to achieve the goals identified. Cognitive Behavioral Therapy, Supportive Counseling, Mindfulness and Relaxation Strategies and other evidenced-based practices will be used to promote progress towards healthy functioning.    Healthcare consumer will: Actively participate in therapy, working towards healthy functioning.     *Justification for Continuation/Discontinuation of Goal: R=Revised, O=Ongoing,  A=Achieved, D=Discontinued   Goal 1) [name] will develop strategies to regulate her emotions and improve her positive self talk   Objective: [name] will incorporate her coping skills into her routine and use them more effectively and consistently   Likert rating baseline date 04/12/21: How Easy - 3; How Often - 50% Target Date Goal Was reviewed Status Code Progress towards goal/Likert rating  05/05/2024  05/06/2023 updated Ongoing                      Goal 2: [name] will improve communication with others, especially close family members, by working on her tendency to assume the worst.   Target Date: 05/05/2024  Interventions: Therapist will help [name] to notice and disengage from maladaptive thoughts and behaviors using CBT based strategies [name] will have opportunities to process her experiences in session Therapist will engage [name] in discussion of coping strategies and creation of a plan to use them regularly Therapist will engage [name] in regular mood checks to continue to monitor her general mental health, prioritizing this at the start of the session to ensure it is covered   Therapist will provide additional resources as appropriate   [name] participated in development of tx plan and provided verbal consent.     Latoyna Hird L Rickesha Veracruz, PhD               Pheng Prokop L Bernard Slayden, PhD               Arlene Lacy, PhD

## 2023-10-14 ENCOUNTER — Other Ambulatory Visit (HOSPITAL_COMMUNITY): Payer: Self-pay

## 2023-10-14 ENCOUNTER — Ambulatory Visit (INDEPENDENT_AMBULATORY_CARE_PROVIDER_SITE_OTHER): Admitting: Clinical

## 2023-10-14 ENCOUNTER — Other Ambulatory Visit: Payer: Self-pay

## 2023-10-14 DIAGNOSIS — F840 Autistic disorder: Secondary | ICD-10-CM

## 2023-10-14 NOTE — Progress Notes (Signed)
 Time: 11:00 am-11:57 am CPT Code: [id number] Diagnosis: F84.0  Jill Alexander was seen in person for therapy. Suicidal ideation and intent were denied since her last session. Session focused on steps she has been taking to prepare for PHP at [facility]. She is scheduled to be seen again Thursday.   Coping Plan 10/09/2023 Watch something funny on TV Listen to a podcast Pet the kitties  Reading some fanfiction Eat an apple (or banana) Eat a piece of chocolate Go for a walk Ask for a hug Read a book or listen to an [employer] a friend (talk to your parents, siblings, a friend) Tell your parents Go to the emergency room  Intake Presenting Problem Jill Alexander presented seeking CBT to help her manage difficulties with emotion regulation and reframing negative thought patterns that contribute to anxiety and worry. Jill Alexander has a diagnosis of Autism Spectrum Disorder (ASD). Symptoms feeling nervous or on edge, difficulty controlling worry, worrying about a variety of things, difficulty relaxing, irritability, fears for the future, feelings of hopelessness, difficulty sleeping, feeling tired, appetite irregularities, poor self-image, passive suicidal ideation without plan or intent (client contracted for safety during initial session) History of Problem  Jill Alexander reported that she moved from [place]  with her family in July of 2018. Since that time, she has been seen by a provider in the area for challenges with emotion regulation and anxiety related to her diagnosis of autism spectrum disorder. Jill Alexander lives with her parents. She has a 77 year-old sister who has struggled with similar issues, as well as a brother who is three years older than she is and lives in [place]. Jill Alexander described a long history of therapy and therapeutic programs in order to help her manage these issues, including a partial hospitalization program in December of 2015.  Recent Trigger  Jill Alexander reported that she was seeking a change in pace  with regard to her therapeutic treatment in order to ensure continued progress.  Marital and Family Information  Present family concerns/problems: Jill Alexander shared several accounts of disputes with her mother, including her mother becoming upset with her for sharing worries with her immediately after her mother has woken up, despite her mother's requests that she not do so.  Strengths/resources in the family/friends: Jill Alexander described overall supportive relationships with her parents.  Marital/sexual history patterns: N/A  Family of Origin  Problems in family of origin: None reported.  Family background / ethnic factors: none reported.  No needs/concerns related to ethnicity reported when asked: No  Education/Vocation  Interpersonal concerns/problems: Jill Alexander reported that she has difficulties with social interactions related to her ASD diagnosis, and often perseverates about perceived missteps in both the immediate and distant past.  Personal strengths: Jill Alexander presented as motivated to improve her situation, and with some insight as to what is effective for her.  Military/work problems/concerns: None noted.  Leisure Activities/Daily Functioning  impaired functioning  Legal Status  No Legal Problems  Medical/Nutritional Concerns  unspecified  Comments: Jill Alexander reported that she often perseverates on perceived medical problems, and described herself as a hypochondriac  Religion/Spirituality  Not reported  Other  General Behavior: cooperative  Attire: appropriate  Gait: normal  Motor Activity: normal  Stream of Thought - Productivity: spontaneous  Stream of thought - Progression: normal  Stream of thought - Language: normal  Emotional tone and reactions - Affect: appropriate  Mental trend/Content of thoughts - Perception: normal  Mental trend/Content of thoughts - Orientation: normal  Mental trend/Content of thoughts - Memory: normal  Mental trend/Content of  thoughts - General knowledge:  consistent with education  Insight: good  Judgment: good  Intelligence: average  Diagnostic Summary  Autism Spectrum Disorder (F84.0)   [facility] Counselor/Therapist Progress Note   Patient ID: Jill Alexander, MRN: [id number],     Individualized Treatment Plan Strengths: Enjoys reading, Seeking counseling, In training program through [employer], I like humor and jokes  Supports: Her dog, her bestfriend since childhood, her parents, her sister    Goal/Needs for Treatment:  In order of importance to patient 1) Emotional Regulation/Anxiety 2) Improve Positive self talk      Client Statement of Needs: Find ways of being nicer to myself, cope w/ anxiety and worry    Treatment Level:Outpatient Counseling  Symptoms:Anxiety, Perseverative worry, Negative self talk  Client Treatment Preferences:weekly counseling,  Prefers to be called Jill Alexander    Healthcare consumer's goal for treatment:  Dr. Lauro Manlove will support the patient's ability to achieve the goals identified. Cognitive Behavioral Therapy, Supportive Counseling, Mindfulness and Relaxation Strategies and other evidenced-based practices will be used to promote progress towards healthy functioning.    Healthcare consumer will: Actively participate in therapy, working towards healthy functioning.     *Justification for Continuation/Discontinuation of Goal: R=Revised, O=Ongoing, A=Achieved, D=Discontinued   Goal 1) Jill Alexander will develop strategies to regulate her emotions and improve her positive self talk   Objective: Jill Alexander will incorporate her coping skills into her routine and use them more effectively and consistently   Likert rating baseline date 04/12/21: How Easy - 3; How Often - 50% Target Date Goal Was reviewed Status Code Progress towards goal/Likert rating  05/05/2024  05/06/2023 updated Ongoing                      Goal 2: Jill Alexander will improve communication with others, especially close family  members, by working on her tendency to assume the worst.   Target Date: 05/05/2024  Interventions: Therapist will help Jill Alexander to notice and disengage from maladaptive thoughts and behaviors using CBT based strategies Jill Alexander will have opportunities to process her experiences in session Therapist will engage Jill Alexander in discussion of coping strategies and creation of a plan to use them regularly Therapist will engage Jill Alexander in regular mood checks to continue to monitor her general mental health, prioritizing this at the start of the session to ensure it is covered   Therapist will provide additional resources as appropriate   Jill Alexander participated in development of tx plan and provided verbal consent.     Gaylord Seydel L Ross Hefferan, PhD               Arlene Lacy, PhD

## 2023-10-15 ENCOUNTER — Other Ambulatory Visit (HOSPITAL_COMMUNITY): Payer: Self-pay

## 2023-10-16 ENCOUNTER — Other Ambulatory Visit: Payer: Self-pay

## 2023-10-16 ENCOUNTER — Ambulatory Visit (INDEPENDENT_AMBULATORY_CARE_PROVIDER_SITE_OTHER): Admitting: Clinical

## 2023-10-16 DIAGNOSIS — F840 Autistic disorder: Secondary | ICD-10-CM

## 2023-10-16 NOTE — Progress Notes (Addendum)
 Time: 2:00 pm-2:57 pm CPT Code: [id number] Diagnosis: F84.0  Jill Alexander was seen in person for therapy. Session focused on her concerns about her enjoyment of fanfiction, and preparing for her transition to the partial hospitalization program at [facility] next week. Jill Alexander will let the therapist know once she has completed the program at [facility] and is ready to resume sessions.    Coping Plan 10/09/2023 Watch something funny on TV Listen to a podcast Pet the kitties  Reading some fanfiction Eat an apple (or banana) Eat a piece of chocolate Go for a walk Ask for a hug Read a book or listen to an [employer] a friend (talk to your parents, siblings, a friend) Tell your parents Go to the emergency room  12/03/2023  Therapist contacted Jill Alexander via secure message on 8/5 to check in as to her timeline for IOP and let her know that her next appointment is scheduled for 8/26. Jill Alexander called the office to ask that the therapist complete a form for her. She was notified that the therapist will be out of the office from 8/9-8/17, and to drop off the form by EOD 8/6 if she would like it completed before therapist leaves. Therapist followed up via phone on 8/6, and Jill Alexander let her know that it was ok to complete the form during her next session on 8/26. She had graduated from IOP on 8/6, and assured the therapist that she feels ok to wait until 8/26 for her next appointment, and will reach out if this changes.  Intake Presenting Problem Jill Alexander presented seeking CBT to help her manage difficulties with emotion regulation and reframing negative thought patterns that contribute to anxiety and worry. Jill Alexander has a diagnosis of Autism Spectrum Disorder (ASD). Symptoms feeling nervous or on edge, difficulty controlling worry, worrying about a variety of things, difficulty relaxing, irritability, fears for the future, feelings of hopelessness, difficulty sleeping, feeling tired, appetite irregularities, poor  self-image, passive suicidal ideation without plan or intent (client contracted for safety during initial session) History of Problem  Jill Alexander reported that she moved from [place]  with her family in July of 2018. Since that time, she has been seen by a provider in the area for challenges with emotion regulation and anxiety related to her diagnosis of autism spectrum disorder. Jill Alexander lives with her parents. She has a 30 year-old sister who has struggled with similar issues, as well as a brother who is 30 years older than she is and lives in [place]. Jill Alexander described a long history of therapy and therapeutic programs in order to help her manage these issues, including a partial hospitalization program in December of 2015.  Recent Trigger  Jill Alexander reported that she was seeking a change in pace with regard to her therapeutic treatment in order to ensure continued progress.  Marital and Family Information  Present family concerns/problems: Jill Alexander shared several accounts of disputes with her mother, including her mother becoming upset with her for sharing worries with her immediately after her mother has woken up, despite her mother's requests that she not do so.  Strengths/resources in the family/friends: Jill Alexander described overall supportive relationships with her parents.  Marital/sexual history patterns: N/A  Family of Origin  Problems in family of origin: None reported.  Family background / ethnic factors: none reported.  No needs/concerns related to ethnicity reported when asked: No  Education/Vocation  Interpersonal concerns/problems: Jill Alexander reported that she has difficulties with social interactions related to her ASD diagnosis, and often perseverates about perceived missteps in  both the immediate and distant past.  Personal strengths: Jill Alexander presented as motivated to improve her situation, and with some insight as to what is effective for her.  Military/work problems/concerns: None noted.  Leisure  Activities/Daily Functioning  impaired functioning  Legal Status  No Legal Problems  Medical/Nutritional Concerns  unspecified  Comments: Jill Alexander reported that she often perseverates on perceived medical problems, and described herself as a hypochondriac  Religion/Spirituality  Not reported  Other  General Behavior: cooperative  Attire: appropriate  Gait: normal  Motor Activity: normal  Stream of Thought - Productivity: spontaneous  Stream of thought - Progression: normal  Stream of thought - Language: normal  Emotional tone and reactions - Affect: appropriate  Mental trend/Content of thoughts - Perception: normal  Mental trend/Content of thoughts - Orientation: normal  Mental trend/Content of thoughts - Memory: normal  Mental trend/Content of thoughts - General knowledge: consistent with education  Insight: good  Judgment: good  Intelligence: average  Diagnostic Summary  Autism Spectrum Disorder (F84.0)   [facility] Counselor/Therapist Progress Note   Patient ID: Jill Alexander, MRN: [id number],     Individualized Treatment Plan Strengths: Enjoys reading, Seeking counseling, In training program through [employer], I like humor and jokes  Supports: Her dog, her bestfriend since childhood, her parents, her sister    Goal/Needs for Treatment:  In order of importance to patient 1) Emotional Regulation/Anxiety 2) Improve Positive self talk      Client Statement of Needs: Find ways of being nicer to myself, cope w/ anxiety and worry    Treatment Level:Outpatient Counseling  Symptoms:Anxiety, Perseverative worry, Negative self talk  Client Treatment Preferences:weekly counseling,  Prefers to be called Jill Alexander    Healthcare consumer's goal for treatment:  Dr. Kymia Simi will support the patient's ability to achieve the goals identified. Cognitive Behavioral Therapy, Supportive Counseling, Mindfulness and Relaxation Strategies and other evidenced-based  practices will be used to promote progress towards healthy functioning.    Healthcare consumer will: Actively participate in therapy, working towards healthy functioning.     *Justification for Continuation/Discontinuation of Goal: R=Revised, O=Ongoing, A=Achieved, D=Discontinued   Goal 1) Jill Alexander will develop strategies to regulate her emotions and improve her positive self talk   Objective: Jill Alexander will incorporate her coping skills into her routine and use them more effectively and consistently   Likert rating baseline date 04/12/21: How Easy - 3; How Often - 50% Target Date Goal Was reviewed Status Code Progress towards goal/Likert rating  05/05/2024  05/06/2023 updated Ongoing                      Goal 2: Jill Alexander will improve communication with others, especially close family members, by working on her tendency to assume the worst.   Target Date: 05/05/2024  Interventions: Therapist will help Jill Alexander to notice and disengage from maladaptive thoughts and behaviors using CBT based strategies Jill Alexander will have opportunities to process her experiences in session Therapist will engage Jill Alexander in discussion of coping strategies and creation of a plan to use them regularly Therapist will engage Jill Alexander in regular mood checks to continue to monitor her general mental health, prioritizing this at the start of the session to ensure it is covered   Therapist will provide additional resources as appropriate   Jill Alexander participated in development of tx plan and provided verbal consent.      Tauriel Scronce L Nigel Wessman, PhD               Elvie Maines  LITTIE Ponto, PhD

## 2023-10-20 ENCOUNTER — Other Ambulatory Visit (HOSPITAL_COMMUNITY): Payer: Self-pay

## 2023-10-20 ENCOUNTER — Other Ambulatory Visit: Payer: Self-pay

## 2023-10-20 DIAGNOSIS — F332 Major depressive disorder, recurrent severe without psychotic features: Secondary | ICD-10-CM | POA: Diagnosis not present

## 2023-10-21 ENCOUNTER — Ambulatory Visit: Admitting: Clinical

## 2023-10-21 DIAGNOSIS — F332 Major depressive disorder, recurrent severe without psychotic features: Secondary | ICD-10-CM | POA: Diagnosis not present

## 2023-10-22 DIAGNOSIS — F332 Major depressive disorder, recurrent severe without psychotic features: Secondary | ICD-10-CM | POA: Diagnosis not present

## 2023-10-23 DIAGNOSIS — F332 Major depressive disorder, recurrent severe without psychotic features: Secondary | ICD-10-CM | POA: Diagnosis not present

## 2023-10-24 DIAGNOSIS — F332 Major depressive disorder, recurrent severe without psychotic features: Secondary | ICD-10-CM | POA: Diagnosis not present

## 2023-10-27 ENCOUNTER — Encounter

## 2023-10-27 DIAGNOSIS — F332 Major depressive disorder, recurrent severe without psychotic features: Secondary | ICD-10-CM | POA: Diagnosis not present

## 2023-10-28 ENCOUNTER — Ambulatory Visit: Admitting: Clinical

## 2023-10-28 DIAGNOSIS — F332 Major depressive disorder, recurrent severe without psychotic features: Secondary | ICD-10-CM | POA: Diagnosis not present

## 2023-10-29 DIAGNOSIS — F332 Major depressive disorder, recurrent severe without psychotic features: Secondary | ICD-10-CM | POA: Diagnosis not present

## 2023-10-30 DIAGNOSIS — F332 Major depressive disorder, recurrent severe without psychotic features: Secondary | ICD-10-CM | POA: Diagnosis not present

## 2023-11-03 DIAGNOSIS — F332 Major depressive disorder, recurrent severe without psychotic features: Secondary | ICD-10-CM | POA: Diagnosis not present

## 2023-11-04 ENCOUNTER — Ambulatory Visit: Admitting: Clinical

## 2023-11-04 DIAGNOSIS — F332 Major depressive disorder, recurrent severe without psychotic features: Secondary | ICD-10-CM | POA: Diagnosis not present

## 2023-11-05 DIAGNOSIS — F332 Major depressive disorder, recurrent severe without psychotic features: Secondary | ICD-10-CM | POA: Diagnosis not present

## 2023-11-06 ENCOUNTER — Other Ambulatory Visit: Payer: Self-pay

## 2023-11-06 DIAGNOSIS — F332 Major depressive disorder, recurrent severe without psychotic features: Secondary | ICD-10-CM | POA: Diagnosis not present

## 2023-11-07 ENCOUNTER — Other Ambulatory Visit (HOSPITAL_COMMUNITY): Payer: Self-pay

## 2023-11-10 ENCOUNTER — Other Ambulatory Visit (HOSPITAL_COMMUNITY): Payer: Self-pay

## 2023-11-10 DIAGNOSIS — F332 Major depressive disorder, recurrent severe without psychotic features: Secondary | ICD-10-CM | POA: Diagnosis not present

## 2023-11-11 ENCOUNTER — Ambulatory Visit: Admitting: Clinical

## 2023-11-11 DIAGNOSIS — F332 Major depressive disorder, recurrent severe without psychotic features: Secondary | ICD-10-CM | POA: Diagnosis not present

## 2023-11-12 DIAGNOSIS — F332 Major depressive disorder, recurrent severe without psychotic features: Secondary | ICD-10-CM | POA: Diagnosis not present

## 2023-11-13 DIAGNOSIS — F332 Major depressive disorder, recurrent severe without psychotic features: Secondary | ICD-10-CM | POA: Diagnosis not present

## 2023-11-14 DIAGNOSIS — F332 Major depressive disorder, recurrent severe without psychotic features: Secondary | ICD-10-CM | POA: Diagnosis not present

## 2023-11-15 ENCOUNTER — Other Ambulatory Visit (HOSPITAL_COMMUNITY): Payer: Self-pay

## 2023-11-17 ENCOUNTER — Other Ambulatory Visit (HOSPITAL_COMMUNITY): Payer: Self-pay

## 2023-11-17 ENCOUNTER — Other Ambulatory Visit: Payer: Self-pay

## 2023-11-17 DIAGNOSIS — F332 Major depressive disorder, recurrent severe without psychotic features: Secondary | ICD-10-CM | POA: Diagnosis not present

## 2023-11-17 MED ORDER — LORAZEPAM 1 MG PO TABS
1.0000 mg | ORAL_TABLET | Freq: Two times a day (BID) | ORAL | 1 refills | Status: DC | PRN
  Filled 2024-01-19 – 2024-02-25 (×3): qty 60, 30d supply, fill #0

## 2023-11-17 MED ORDER — DULOXETINE HCL 60 MG PO CPEP
60.0000 mg | ORAL_CAPSULE | Freq: Every day | ORAL | 0 refills | Status: DC
  Filled 2023-11-17: qty 180, 90d supply, fill #0

## 2023-11-17 MED ORDER — DULOXETINE HCL 60 MG PO CPEP
120.0000 mg | ORAL_CAPSULE | Freq: Every day | ORAL | 1 refills | Status: DC
  Filled 2023-12-14: qty 60, 30d supply, fill #0
  Filled 2024-01-12: qty 60, 30d supply, fill #1

## 2023-11-17 MED ORDER — TOPIRAMATE 50 MG PO TABS
150.0000 mg | ORAL_TABLET | Freq: Every day | ORAL | 0 refills | Status: DC
  Filled 2023-11-17: qty 270, 90d supply, fill #0

## 2023-11-18 ENCOUNTER — Ambulatory Visit: Admitting: Clinical

## 2023-11-18 DIAGNOSIS — F332 Major depressive disorder, recurrent severe without psychotic features: Secondary | ICD-10-CM | POA: Diagnosis not present

## 2023-11-19 ENCOUNTER — Encounter: Payer: Self-pay | Admitting: Dermatology

## 2023-11-19 ENCOUNTER — Ambulatory Visit: Payer: MEDICAID | Admitting: Dermatology

## 2023-11-19 DIAGNOSIS — D229 Melanocytic nevi, unspecified: Secondary | ICD-10-CM

## 2023-11-19 DIAGNOSIS — D224 Melanocytic nevi of scalp and neck: Secondary | ICD-10-CM

## 2023-11-19 NOTE — Patient Instructions (Signed)

## 2023-11-19 NOTE — Progress Notes (Signed)
   New Patient Visit   Subjective  Jill Alexander is a 28 y.o. female who presents for the following:  Patient states that the lesion has been there for years and has gotten darker in color, and has changed in color. Positive for pain and bleeding.   The following portions of the chart were reviewed this encounter and updated as appropriate: medications, allergies, medical history  Review of Systems:  No other skin or systemic complaints except as noted in HPI or Assessment and Plan.  Objective  Well appearing patient in no apparent distress; mood and affect are within normal limits.  A focused examination was performed of the following areas: Right neck  Relevant exam findings are noted in the Assessment and Plan.    Assessment & Plan   MELANOCYTIC NEVI Exam: 5mm pink papule symmetric macules and papules  Treatment Plan: Benign appearing on exam today. Recommend observation. Call clinic for new or changing moles. Recommend daily use of broad spectrum spf 30+ sunscreen to sun-exposed areas.   5mm pink papule  Return for skin check.  LILLETTE Rollene Gobble, RN, am acting as scribe for RUFUS CHRISTELLA HOLY, MD .   Documentation: I have reviewed the above documentation for accuracy and completeness, and I agree with the above.  RUFUS CHRISTELLA HOLY, MD

## 2023-11-20 DIAGNOSIS — F332 Major depressive disorder, recurrent severe without psychotic features: Secondary | ICD-10-CM | POA: Diagnosis not present

## 2023-11-21 DIAGNOSIS — F332 Major depressive disorder, recurrent severe without psychotic features: Secondary | ICD-10-CM | POA: Diagnosis not present

## 2023-11-24 DIAGNOSIS — F332 Major depressive disorder, recurrent severe without psychotic features: Secondary | ICD-10-CM | POA: Diagnosis not present

## 2023-11-25 ENCOUNTER — Ambulatory Visit: Admitting: Clinical

## 2023-11-25 DIAGNOSIS — F332 Major depressive disorder, recurrent severe without psychotic features: Secondary | ICD-10-CM | POA: Diagnosis not present

## 2023-11-26 DIAGNOSIS — F332 Major depressive disorder, recurrent severe without psychotic features: Secondary | ICD-10-CM | POA: Diagnosis not present

## 2023-11-27 DIAGNOSIS — F332 Major depressive disorder, recurrent severe without psychotic features: Secondary | ICD-10-CM | POA: Diagnosis not present

## 2023-11-28 DIAGNOSIS — F332 Major depressive disorder, recurrent severe without psychotic features: Secondary | ICD-10-CM | POA: Diagnosis not present

## 2023-11-30 ENCOUNTER — Other Ambulatory Visit (HOSPITAL_COMMUNITY): Payer: Self-pay

## 2023-12-01 DIAGNOSIS — F332 Major depressive disorder, recurrent severe without psychotic features: Secondary | ICD-10-CM | POA: Diagnosis not present

## 2023-12-02 ENCOUNTER — Ambulatory Visit: Admitting: Clinical

## 2023-12-02 DIAGNOSIS — F332 Major depressive disorder, recurrent severe without psychotic features: Secondary | ICD-10-CM | POA: Diagnosis not present

## 2023-12-03 DIAGNOSIS — F332 Major depressive disorder, recurrent severe without psychotic features: Secondary | ICD-10-CM | POA: Diagnosis not present

## 2023-12-03 DIAGNOSIS — F411 Generalized anxiety disorder: Secondary | ICD-10-CM | POA: Diagnosis not present

## 2023-12-03 DIAGNOSIS — F331 Major depressive disorder, recurrent, moderate: Secondary | ICD-10-CM | POA: Diagnosis not present

## 2023-12-08 ENCOUNTER — Telehealth: Admitting: Physician Assistant

## 2023-12-08 ENCOUNTER — Encounter: Payer: Self-pay | Admitting: Physician Assistant

## 2023-12-08 DIAGNOSIS — R21 Rash and other nonspecific skin eruption: Secondary | ICD-10-CM | POA: Diagnosis not present

## 2023-12-08 NOTE — Progress Notes (Signed)
 Virtual Visit Consent   Jill Alexander, you are scheduled for a virtual visit with a [facility] provider today. Just as with appointments in the office, your consent must be obtained to participate. Your consent will be active for this visit and any virtual visit you may have with one of our providers in the next 365 days. If you have a MyChart account, a copy of this consent can be sent to you electronically.  As this is a virtual visit, video technology does not allow for your provider to perform a traditional examination. This may limit your provider's ability to fully assess your condition. If your provider identifies any concerns that need to be evaluated in person or the need to arrange testing (such as labs, EKG, etc.), we will make arrangements to do so. Although advances in technology are sophisticated, we cannot ensure that it will always work on either your end or our end. If the connection with a video visit is poor, the visit may have to be switched to a telephone visit. With either a video or telephone visit, we are not always able to ensure that we have a secure connection.  By engaging in this virtual visit, you consent to the provision of healthcare and authorize for your insurance to be billed (if applicable) for the services provided during this visit. Depending on your insurance coverage, you may receive a charge related to this service.  I need to obtain your verbal consent now. Are you willing to proceed with your visit today? Jill Alexander has provided verbal consent on 12/08/2023 for a virtual visit (video or telephone). Jill CHRISTELLA Dickinson, PA-C  Date: 12/08/2023 6:10 PM   Virtual Visit via Video Note   I, Jill Alexander, connected with  Jill Alexander  ([id number], 13-Dec-1995) on 12/08/23 at 12:45 PM EDT by a video-enabled telemedicine application and verified that I am speaking with the correct person using two identifiers.  Location: Patient:  Virtual Visit Location Patient: Home Provider: Virtual Visit Location Provider: Home Office   I discussed the limitations of evaluation and management by telemedicine and the availability of in person appointments. The patient expressed understanding and agreed to proceed.    History of Present Illness: Jill Alexander is a 28 y.o. who identifies as a female who was assigned female at birth, and is being seen today for rash.  HPI: Rash This is a new problem. The current episode started 1 to 4 weeks ago (possible a week to 3 weeks ago, unsure of exact timeline). The affected locations include the right upper leg, right lower leg, left lower leg and left upper leg. The rash is characterized by redness. It is unknown if there was an exposure to a precipitant. Pertinent negatives include no congestion, cough, fatigue, fever, rhinorrhea or shortness of breath. Past treatments include nothing. The treatment provided no relief.     Problems:  Patient Active Problem List   Diagnosis Date Noted   Dysuria 01/11/2023   Excessive daytime sleepiness 03/18/2022   Non-restorative sleep 03/18/2022   Chronic fatigue and malaise 03/18/2022   Loud snoring 03/18/2022   Chiari malformation type I (HCC) 03/18/2022   GAD (generalized anxiety disorder) 02/06/2022   MDD (major depressive disorder), recurrent severe, 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   Women's annual routine gynecological examination 12/25/2021   Encounter for birth control pills maintenance 11/22/2021   History of Chiari malformation 09/26/2021   Urinary urgency 09/26/2021   Common migraine with intractable migraine 07/01/2019  Intermittent headache 10/26/2018   Epigastric pain 10/26/2018   Gastroesophageal reflux disease 10/26/2018   Bipolar 2 disorder (HCC) 06/02/2018   Panic attacks 06/02/2018   Autism spectrum disorder 05/06/2018   Anxiety and depression 05/06/2018   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    Seizures (HCC) 04/09/2017   Tremor, 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    Allergies:  Allergies  Allergen Reactions   Gluten Meal    Medications:  Current Outpatient Medications:    acetaminophen (TYLENOL) 500 MG tablet, Take 500 mg by mouth every 6 (six) hours as needed., Disp: , Rfl:    amoxicillin -clavulanate (AUGMENTIN ) 875-125 MG tablet, Take 1 tablet by mouth 2 (two) times daily., Disp: 14 tablet, Rfl: 0   benzonatate  (TESSALON ) 100 MG capsule, Take 1-2 capsules (100-200 mg total) by mouth 3 (three) times daily as needed., Disp: 30 capsule, Rfl: 0   Caraway Oil-Levomenthol (FDGARD) 25-20.75 MG CAPS, Take as needed, Disp: , Rfl:    Dextromethorphan -buPROPion  ER (AUVELITY ) 45-105 MG TBCR, Take 1 tablet by mouth 2 (two) times daily., Disp: 60 tablet, Rfl: 2   Dextromethorphan -buPROPion  ER (AUVELITY ) 45-105 MG TBCR, Take 1 tablet by mouth 2 (two) times daily., Disp: 180 tablet, Rfl: 1   Dextromethorphan -buPROPion  ER (AUVELITY ) 45-105 MG TBCR, Take 1 tablet by mouth 2 (two) times daily., Disp: 60 tablet, Rfl: 2   Dextromethorphan -buPROPion  ER (AUVELITY ) 45-105 MG TBCR, Take 1 tablet by mouth 2 (two) times daily., Disp: 60 tablet, Rfl: 2   Dextromethorphan -buPROPion  ER (AUVELITY ) 45-105 MG TBCR, Take 1 tablet by mouth 2 (two) times daily., Disp: 60 tablet, Rfl: 5   Docusate Sodium (COLACE PO), Take by mouth., Disp: , Rfl:    drospirenone -ethinyl estradiol  (YAZ) 3-0.02 MG tablet, Take 1 tablet by mouth daily., Disp: 28 tablet, Rfl: 4   DULoxetine  (CYMBALTA ) 60 MG capsule, Take 1 capsule (60 mg total) by mouth daily., Disp: 90 capsule, Rfl: 1   DULoxetine  (CYMBALTA ) 60 MG capsule, Take 2 capsules (120 mg total) by mouth daily., Disp: 60 capsule, Rfl: 1   DULoxetine  (CYMBALTA ) 60 MG capsule, Take 1 capsule by mouth daily., Disp: 180 capsule, Rfl: 0   Famotidine (PEPCID PO), Take by mouth., Disp: , Rfl:    Fremanezumab -vfrm (AJOVY ) 225 MG/1.5ML SOAJ, Inject 225 mg into the skin every 30 (thirty) days., Disp:  1.5 mL, Rfl: 11   guanFACINE  (INTUNIV ) 1 MG TB24 ER tablet, Take 1 tablet (1 mg total) by mouth at bedtime for sleep., Disp: 30 tablet, Rfl: 1   guanFACINE  (INTUNIV ) 2 MG TB24 ER tablet, Take 1 tablet (2 mg total) by mouth at bedtime for sleep, Disp: 30 tablet, Rfl: 1   guanFACINE  (INTUNIV ) 2 MG TB24 ER tablet, Take 1 tablet (2 mg total) by mouth at bedtime for sleep., Disp: 90 tablet, Rfl: 1   Ibuprofen (MOTRIN PO), Take 200 mg by mouth as needed., Disp: , Rfl:    lamoTRIgine  (LAMICTAL ) 200 MG tablet, Take 2 tablets (400 mg total) by mouth daily., Disp: 180 tablet, Rfl: 1   LORazepam  (ATIVAN ) 1 MG tablet, Take 1 tablet (1 mg total) by mouth 2 (two) times daily as needed for severe anxiety, Disp: 60 tablet, Rfl: 1   LORazepam  (ATIVAN ) 1 MG tablet, Take 1 tablet (1 mg total) by mouth 2 (two) times daily as needed for severe anxiety., Disp: 60 tablet, Rfl: 1   LORazepam  (ATIVAN ) 1 MG tablet, Take 1 tablet (1 mg total) by mouth 2 (two) times daily as needed for severe anxiety., Disp: 60  tablet, Rfl: 1   lurasidone  (LATUDA ) 40 MG TABS tablet, Take 1 tablet (40 mg total) by mouth daily with breakfast., Disp: 90 tablet, Rfl: 0   Lurasidone  HCl 60 MG TABS, Take 1 tablet (60 mg total) by mouth every evening with food., Disp: 30 tablet, Rfl: 1   Lurasidone  HCl 60 MG TABS, Take 1 tablet (60 mg total) by mouth every evening with food., Disp: 90 tablet, Rfl: 1   Lurasidone  HCl 60 MG TABS, Take 1 tablet (60 mg total) by mouth every evening with food, Disp: 30 tablet, Rfl: 1   Lurasidone  HCl 60 MG TABS, Take 1 tablet (60 mg total) by mouth every evening with food., Disp: 90 tablet, Rfl: 1   melatonin 1 MG TABS tablet, Take 1 tablet (1 mg total) by mouth at bedtime as needed (insomnia)., Disp: 30 tablet, Rfl: 0   mirabegron  ER (MYRBETRIQ ) 50 MG TB24 tablet, Take 1 tablet (50 mg total) by mouth daily., Disp: 30 tablet, Rfl: 3   Multiple Vitamins-Minerals (MULTIVITAMIN PO), Take 1 tablet by mouth daily., Disp: , Rfl:     pantoprazole  (PROTONIX ) 40 MG tablet, Take 1 tablet (40 mg total) by mouth daily., Disp: 30 tablet, Rfl: 3   polyethylene glycol (MIRALAX ) packet, Take 17 g by mouth daily as needed., Disp: 14 each, Rfl: 0   rizatriptan  (MAXALT ) 10 MG tablet, Take 1 tablet (10 mg total) by mouth as needed for migraine. May repeat in 2 hours if needed., Disp: 10 tablet, Rfl: 11   topiramate  (TOPAMAX ) 50 MG tablet, Take 3 tablets (150 mg total) by mouth daily., Disp: 270 tablet, Rfl: 0   topiramate  (TOPAMAX ) 50 MG tablet, Take 3 tablets (150 mg total) by mouth daily., Disp: 270 tablet, Rfl: 0  Observations/Objective: Patient is well-developed, well-nourished in no acute distress.  Resting comfortably at home.  Head is normocephalic, atraumatic.  No labored breathing.  Speech is clear and coherent with logical content.  Patient is alert and oriented at baseline.       Assessment and Plan: 1. Rash (Primary)  - Tiny, red, papular lesions with a pale annular base noted in photo but patient is not having any symptoms associated (no itching, burning, pain) - Suspect possible dust mite bites - Should be self limited and improve with time - If symptoms worsen or fail to improve she should seek in person evaluation to better differentiate rash  *Of note: video quality was unavailable during the call and the audio cut out about half way through. Appt was continued with Mychart messaging to complete.  Follow Up Instructions: I discussed the assessment and treatment plan with the patient. The patient was provided an opportunity to ask questions and all were answered. The patient agreed with the plan and demonstrated an understanding of the instructions.  A copy of instructions were sent to the patient via MyChart unless otherwise noted below.    The patient was advised to call back or seek an in-person evaluation if the symptoms worsen or if the condition fails to improve as anticipated.    Jill CHRISTELLA Dickinson, PA-C

## 2023-12-08 NOTE — Patient Instructions (Signed)
 Jill Alexander, thank you for joining Jill CHRISTELLA Dickinson, PA-C for today's virtual visit.  While this provider is not your primary care provider (PCP), if your PCP is located in our provider database this encounter information will be shared with them immediately following your visit.   A [facility] MyChart account gives you access to today's visit and all your visits, tests, and labs performed at [facility]  click here if you don't have a [facility] MyChart account or go to [email or web address]  Consent: (Patient) Jill Alexander provided verbal consent for this virtual visit at the beginning of the encounter.  Current Medications:  Current Outpatient Medications:    acetaminophen (TYLENOL) 500 MG tablet, Take 500 mg by mouth every 6 (six) hours as needed., Disp: , Rfl:    amoxicillin -clavulanate (AUGMENTIN ) 875-125 MG tablet, Take 1 tablet by mouth 2 (two) times daily., Disp: 14 tablet, Rfl: 0   benzonatate  (TESSALON ) 100 MG capsule, Take 1-2 capsules (100-200 mg total) by mouth 3 (three) times daily as needed., Disp: 30 capsule, Rfl: 0   Caraway Oil-Levomenthol (FDGARD) 25-20.75 MG CAPS, Take as needed, Disp: , Rfl:    Dextromethorphan -buPROPion  ER (AUVELITY ) 45-105 MG TBCR, Take 1 tablet by mouth 2 (two) times daily., Disp: 60 tablet, Rfl: 2   Dextromethorphan -buPROPion  ER (AUVELITY ) 45-105 MG TBCR, Take 1 tablet by mouth 2 (two) times daily., Disp: 180 tablet, Rfl: 1   Dextromethorphan -buPROPion  ER (AUVELITY ) 45-105 MG TBCR, Take 1 tablet by mouth 2 (two) times daily., Disp: 60 tablet, Rfl: 2   Dextromethorphan -buPROPion  ER (AUVELITY ) 45-105 MG TBCR, Take 1 tablet by mouth 2 (two) times daily., Disp: 60 tablet, Rfl: 2   Dextromethorphan -buPROPion  ER (AUVELITY ) 45-105 MG TBCR, Take 1 tablet by mouth 2 (two) times daily., Disp: 60 tablet, Rfl: 5   Docusate Sodium (COLACE PO), Take by mouth., Disp: , Rfl:    drospirenone -ethinyl estradiol  (YAZ) 3-0.02 MG  tablet, Take 1 tablet by mouth daily., Disp: 28 tablet, Rfl: 4   DULoxetine  (CYMBALTA ) 60 MG capsule, Take 1 capsule (60 mg total) by mouth daily., Disp: 90 capsule, Rfl: 1   DULoxetine  (CYMBALTA ) 60 MG capsule, Take 2 capsules (120 mg total) by mouth daily., Disp: 60 capsule, Rfl: 1   DULoxetine  (CYMBALTA ) 60 MG capsule, Take 1 capsule by mouth daily., Disp: 180 capsule, Rfl: 0   Famotidine (PEPCID PO), Take by mouth., Disp: , Rfl:    Fremanezumab -vfrm (AJOVY ) 225 MG/1.5ML SOAJ, Inject 225 mg into the skin every 30 (thirty) days., Disp: 1.5 mL, Rfl: 11   guanFACINE  (INTUNIV ) 1 MG TB24 ER tablet, Take 1 tablet (1 mg total) by mouth at bedtime for sleep., Disp: 30 tablet, Rfl: 1   guanFACINE  (INTUNIV ) 2 MG TB24 ER tablet, Take 1 tablet (2 mg total) by mouth at bedtime for sleep, Disp: 30 tablet, Rfl: 1   guanFACINE  (INTUNIV ) 2 MG TB24 ER tablet, Take 1 tablet (2 mg total) by mouth at bedtime for sleep., Disp: 90 tablet, Rfl: 1   Ibuprofen (MOTRIN PO), Take 200 mg by mouth as needed., Disp: , Rfl:    lamoTRIgine  (LAMICTAL ) 200 MG tablet, Take 2 tablets (400 mg total) by mouth daily., Disp: 180 tablet, Rfl: 1   LORazepam  (ATIVAN ) 1 MG tablet, Take 1 tablet (1 mg total) by mouth 2 (two) times daily as needed for severe anxiety, Disp: 60 tablet, Rfl: 1   LORazepam  (ATIVAN ) 1 MG tablet, Take 1 tablet (1 mg total) by mouth 2 (two) times daily as needed  for severe anxiety., Disp: 60 tablet, Rfl: 1   LORazepam  (ATIVAN ) 1 MG tablet, Take 1 tablet (1 mg total) by mouth 2 (two) times daily as needed for severe anxiety., Disp: 60 tablet, Rfl: 1   lurasidone  (LATUDA ) 40 MG TABS tablet, Take 1 tablet (40 mg total) by mouth daily with breakfast., Disp: 90 tablet, Rfl: 0   Lurasidone  HCl 60 MG TABS, Take 1 tablet (60 mg total) by mouth every evening with food., Disp: 30 tablet, Rfl: 1   Lurasidone  HCl 60 MG TABS, Take 1 tablet (60 mg total) by mouth every evening with food., Disp: 90 tablet, Rfl: 1   Lurasidone  HCl  60 MG TABS, Take 1 tablet (60 mg total) by mouth every evening with food, Disp: 30 tablet, Rfl: 1   Lurasidone  HCl 60 MG TABS, Take 1 tablet (60 mg total) by mouth every evening with food., Disp: 90 tablet, Rfl: 1   melatonin 1 MG TABS tablet, Take 1 tablet (1 mg total) by mouth at bedtime as needed (insomnia)., Disp: 30 tablet, Rfl: 0   mirabegron  ER (MYRBETRIQ ) 50 MG TB24 tablet, Take 1 tablet (50 mg total) by mouth daily., Disp: 30 tablet, Rfl: 3   Multiple Vitamins-Minerals (MULTIVITAMIN PO), Take 1 tablet by mouth daily., Disp: , Rfl:    pantoprazole  (PROTONIX ) 40 MG tablet, Take 1 tablet (40 mg total) by mouth daily., Disp: 30 tablet, Rfl: 3   polyethylene glycol (MIRALAX ) packet, Take 17 g by mouth daily as needed., Disp: 14 each, Rfl: 0   rizatriptan  (MAXALT ) 10 MG tablet, Take 1 tablet (10 mg total) by mouth as needed for migraine. May repeat in 2 hours if needed., Disp: 10 tablet, Rfl: 11   topiramate  (TOPAMAX ) 50 MG tablet, Take 3 tablets (150 mg total) by mouth daily., Disp: 270 tablet, Rfl: 0   topiramate  (TOPAMAX ) 50 MG tablet, Take 3 tablets (150 mg total) by mouth daily., Disp: 270 tablet, Rfl: 0   Medications ordered in this encounter:  No orders of the defined types were placed in this encounter.    *If you need refills on other medications prior to your next appointment, please contact your pharmacy*  Follow-Up: Call back or seek an in-person evaluation if the symptoms worsen or if the condition fails to improve as anticipated.  [facility] [phone]   If you have been instructed to have an in-person evaluation today at a local Urgent Care facility, please use the link below. It will take you to a list of all of our available [facility], including address, phone number and hours of operation. Please do not delay care.  [facility]  If you or a family member do not have a primary care provider, use the link below to schedule a  visit and establish care. When you choose a [facility] primary care physician or advanced practice provider, you gain a long-term partner in health. Find a Primary Care Provider  Learn more about [facility]'s in-office and virtual care options: [facility] - Get Care Now

## 2023-12-14 ENCOUNTER — Other Ambulatory Visit: Payer: Self-pay | Admitting: Gastroenterology

## 2023-12-14 ENCOUNTER — Other Ambulatory Visit (HOSPITAL_COMMUNITY): Payer: Self-pay

## 2023-12-15 ENCOUNTER — Other Ambulatory Visit: Payer: Self-pay

## 2023-12-15 ENCOUNTER — Other Ambulatory Visit (HOSPITAL_COMMUNITY): Payer: Self-pay

## 2023-12-15 MED ORDER — GUANFACINE HCL ER 2 MG PO TB24
2.0000 mg | ORAL_TABLET | Freq: Every day | ORAL | 1 refills | Status: DC
  Filled 2024-01-12: qty 30, 30d supply, fill #0

## 2023-12-15 MED ORDER — PANTOPRAZOLE SODIUM 40 MG PO TBEC
40.0000 mg | DELAYED_RELEASE_TABLET | Freq: Every day | ORAL | 2 refills | Status: DC
  Filled 2023-12-15: qty 30, 30d supply, fill #0
  Filled 2024-01-12: qty 30, 30d supply, fill #1
  Filled 2024-02-08: qty 30, 30d supply, fill #2

## 2023-12-17 ENCOUNTER — Other Ambulatory Visit (HOSPITAL_COMMUNITY): Payer: Self-pay

## 2023-12-17 DIAGNOSIS — R350 Frequency of micturition: Secondary | ICD-10-CM | POA: Diagnosis not present

## 2023-12-17 DIAGNOSIS — R351 Nocturia: Secondary | ICD-10-CM | POA: Diagnosis not present

## 2023-12-17 DIAGNOSIS — R3915 Urgency of urination: Secondary | ICD-10-CM | POA: Diagnosis not present

## 2023-12-17 MED ORDER — MIRABEGRON ER 50 MG PO TB24
50.0000 mg | ORAL_TABLET | Freq: Every day | ORAL | 3 refills | Status: DC
  Filled 2023-12-17: qty 90, 90d supply, fill #0

## 2023-12-18 ENCOUNTER — Other Ambulatory Visit (HOSPITAL_COMMUNITY): Payer: Self-pay

## 2023-12-22 ENCOUNTER — Other Ambulatory Visit (HOSPITAL_COMMUNITY): Payer: Self-pay

## 2023-12-22 DIAGNOSIS — F331 Major depressive disorder, recurrent, moderate: Secondary | ICD-10-CM | POA: Diagnosis not present

## 2023-12-22 DIAGNOSIS — F840 Autistic disorder: Secondary | ICD-10-CM | POA: Diagnosis not present

## 2023-12-22 DIAGNOSIS — F900 Attention-deficit hyperactivity disorder, predominantly inattentive type: Secondary | ICD-10-CM | POA: Diagnosis not present

## 2023-12-22 DIAGNOSIS — F411 Generalized anxiety disorder: Secondary | ICD-10-CM | POA: Diagnosis not present

## 2023-12-22 MED ORDER — GUANFACINE HCL ER 2 MG PO TB24
2.0000 mg | ORAL_TABLET | Freq: Every day | ORAL | 1 refills | Status: DC
  Filled 2024-01-18 – 2024-02-23 (×3): qty 90, 90d supply, fill #0
  Filled 2024-05-21 (×2): qty 90, 90d supply, fill #1

## 2023-12-22 MED ORDER — AUVELITY 45-105 MG PO TBCR
1.0000 | EXTENDED_RELEASE_TABLET | Freq: Two times a day (BID) | ORAL | 5 refills | Status: DC
  Filled 2023-12-28: qty 60, 30d supply, fill #0
  Filled 2024-01-19 – 2024-01-25 (×2): qty 60, 30d supply, fill #1

## 2023-12-23 ENCOUNTER — Ambulatory Visit (INDEPENDENT_AMBULATORY_CARE_PROVIDER_SITE_OTHER): Admitting: Clinical

## 2023-12-23 DIAGNOSIS — F840 Autistic disorder: Secondary | ICD-10-CM | POA: Diagnosis not present

## 2023-12-23 NOTE — Progress Notes (Signed)
 Time: 11:00 am-11:57 am CPT Code: 09162E Diagnosis: F84.0  Jill Alexander was seen in person for therapy. She reported that she felt she had benefited from the [facility]. She reflected upon developments since that time, including the decision to pause her job search, and her grandmother's passing. She is scheduled to be seen again in two weeks.   Coping Plan 10/09/2023 Watch something funny on TV Listen to a podcast Pet the kitties  Reading some fanfiction Eat an apple (or banana) Eat a piece of chocolate Go for a walk Ask for a hug Read a book or listen to an [employer] [facility] a friend (talk to your parents, siblings, a friend) Tell your parents Go to the emergency room  12/03/2023  Therapist contacted Jill Alexander via secure message on 8/5 to check in as to her timeline for IOP and let her know that her next appointment is scheduled for 8/26. Jill Alexander called the office to ask that the therapist complete a form for her. She was notified that the therapist will be out of the office from 8/9-8/17, and to drop off the form by EOD 8/6 if she would like it completed before therapist leaves. Therapist followed up via phone on 8/6, and Jill Alexander let her know that it was ok to complete the form during her next session on 8/26. She had graduated from IOP on 8/6, and assured the therapist that she feels ok to wait until 8/26 for her next appointment, and will reach out if this changes.  Intake Presenting Problem Jill Alexander presented seeking CBT to help her manage difficulties with emotion regulation and reframing negative thought patterns that contribute to anxiety and worry. Jill Alexander has a diagnosis of Autism Spectrum Disorder (ASD). Symptoms feeling nervous or on edge, difficulty controlling worry, worrying about a variety of things, difficulty relaxing, irritability, fears for the future, feelings of hopelessness, difficulty sleeping, feeling tired, appetite irregularities, poor self-image, passive suicidal ideation  without plan or intent (client contracted for safety during initial session) History of Problem  Jill Alexander reported that she moved from [place]  with her family in July of 2030. Since that time, she has been seen by a provider in the area for challenges with emotion regulation and anxiety related to her diagnosis of autism spectrum disorder. Jill Alexander lives with her parents. She has a 30 year-old sister who has struggled with similar issues, as well as a brother who is three years older than she is and lives in [place]. Jill Alexander described a long history of therapy and therapeutic programs in order to help her manage these issues, including a partial hospitalization program in December of 2030.  Recent Trigger  Jill Alexander reported that she was seeking a change in pace with regard to her therapeutic treatment in order to ensure continued progress.  Marital and Family Information  Present family concerns/problems: Jill Alexander shared several accounts of disputes with her mother, including her mother becoming upset with her for sharing worries with her immediately after her mother has woken up, despite her mother's requests that she not do so.  Strengths/resources in the family/friends: Jill Alexander described overall supportive relationships with her parents.  Marital/sexual history patterns: N/A  Family of Origin  Problems in family of origin: None reported.  Family background / ethnic factors: none reported.  No needs/concerns related to ethnicity reported when asked: No  Education/Vocation  Interpersonal concerns/problems: Jill Alexander reported that she has difficulties with social interactions related to her ASD diagnosis, and often perseverates about perceived missteps in both the immediate and distant  past.  Personal strengths: Jill Alexander presented as motivated to improve her situation, and with some insight as to what is effective for her.  Military/work problems/concerns: None noted.  Leisure Activities/Daily Functioning  impaired  functioning  Legal Status  No Legal Problems  Medical/Nutritional Concerns  unspecified  Comments: Jill Alexander reported that she often perseverates on perceived medical problems, and described herself as a hypochondriac  Religion/Spirituality  Not reported  Other  General Behavior: cooperative  Attire: appropriate  Gait: normal  Motor Activity: normal  Stream of Thought - Productivity: spontaneous  Stream of thought - Progression: normal  Stream of thought - Language: normal  Emotional tone and reactions - Affect: appropriate  Mental trend/Content of thoughts - Perception: normal  Mental trend/Content of thoughts - Orientation: normal  Mental trend/Content of thoughts - Memory: normal  Mental trend/Content of thoughts - General knowledge: consistent with education  Insight: good  Judgment: good  Intelligence: average  Diagnostic Summary  Autism Spectrum Disorder (F84.0)   [facility] Counselor/Therapist Progress Note   Patient ID: Ilma Achee, MRN: [id number],     Individualized Treatment Plan Strengths: Enjoys reading, Seeking counseling, In training program through [employer], I like humor and jokes  Supports: Her dog, her bestfriend since childhood, her parents, her sister    Goal/Needs for Treatment:  In order of importance to patient 1) Emotional Regulation/Anxiety 2) Improve Positive self talk      Client Statement of Needs: Find ways of being nicer to myself, cope w/ anxiety and worry    Treatment Level:Outpatient Counseling  Symptoms:Anxiety, Perseverative worry, Negative self talk  Client Treatment Preferences:weekly counseling,  Prefers to be called Jill Alexander    Healthcare consumer's goal for treatment:  Dr. Davina Howlett will support the patient's ability to achieve the goals identified. Cognitive Behavioral Therapy, Supportive Counseling, Mindfulness and Relaxation Strategies and other evidenced-based practices will be used to promote  progress towards healthy functioning.    Healthcare consumer will: Actively participate in therapy, working towards healthy functioning.     *Justification for Continuation/Discontinuation of Goal: R=Revised, O=Ongoing, A=Achieved, D=Discontinued   Goal 1) Jill Alexander will develop strategies to regulate her emotions and improve her positive self talk   Objective: Jill Alexander will incorporate her coping skills into her routine and use them more effectively and consistently   Likert rating baseline date 04/12/21: How Easy - 3; How Often - 50% Target Date Goal Was reviewed Status Code Progress towards goal/Likert rating  05/05/2024  05/06/2023 updated Ongoing                      Goal 2: Jill Alexander will improve communication with others, especially close family members, by working on her tendency to assume the worst.   Target Date: 05/05/2024  Interventions: Therapist will help Jill Alexander to notice and disengage from maladaptive thoughts and behaviors using CBT based strategies Jill Alexander will have opportunities to process her experiences in session Therapist will engage Jill Alexander in discussion of coping strategies and creation of a plan to use them regularly Therapist will engage Jill Alexander in regular mood checks to continue to monitor her general mental health, prioritizing this at the start of the session to ensure it is covered   Therapist will provide additional resources as appropriate   Jill Alexander participated in development of tx plan and provided verbal consent.      Manna Gose L Emre Stock, PhD               Andriette LITTIE Ponto, PhD  Nikola Blackston L Aislee Landgren, PhD

## 2023-12-28 ENCOUNTER — Other Ambulatory Visit (HOSPITAL_COMMUNITY): Payer: Self-pay

## 2023-12-29 ENCOUNTER — Other Ambulatory Visit (HOSPITAL_COMMUNITY): Payer: Self-pay

## 2023-12-30 ENCOUNTER — Ambulatory Visit: Admitting: Clinical

## 2024-01-01 ENCOUNTER — Ambulatory Visit (INDEPENDENT_AMBULATORY_CARE_PROVIDER_SITE_OTHER): Admitting: Dermatology

## 2024-01-01 ENCOUNTER — Encounter: Payer: Self-pay | Admitting: Dermatology

## 2024-01-01 VITALS — BP 116/68 | HR 78

## 2024-01-01 DIAGNOSIS — D1801 Hemangioma of skin and subcutaneous tissue: Secondary | ICD-10-CM

## 2024-01-01 DIAGNOSIS — W908XXA Exposure to other nonionizing radiation, initial encounter: Secondary | ICD-10-CM

## 2024-01-01 DIAGNOSIS — L821 Other seborrheic keratosis: Secondary | ICD-10-CM

## 2024-01-01 DIAGNOSIS — L578 Other skin changes due to chronic exposure to nonionizing radiation: Secondary | ICD-10-CM

## 2024-01-01 DIAGNOSIS — L814 Other melanin hyperpigmentation: Secondary | ICD-10-CM

## 2024-01-01 DIAGNOSIS — Z1283 Encounter for screening for malignant neoplasm of skin: Secondary | ICD-10-CM | POA: Diagnosis not present

## 2024-01-01 DIAGNOSIS — D229 Melanocytic nevi, unspecified: Secondary | ICD-10-CM

## 2024-01-01 NOTE — Patient Instructions (Signed)

## 2024-01-01 NOTE — Progress Notes (Signed)
   Follow-Up Visit   Subjective  Jill Alexander is a 28 y.o. female who presents for the following: Skin Cancer Screening and Full Body Skin Exam  The patient presents for Total-Body Skin Exam (TBSE) for skin cancer screening and mole check. The patient has spots, moles and lesions to be evaluated, some may be new or changing.  The following portions of the chart were reviewed this encounter and updated as appropriate: medications, allergies, medical history  Review of Systems:  No other skin or systemic complaints except as noted in HPI or Assessment and Plan.  Objective  Well appearing patient in no apparent distress; mood and affect are within normal limits.  A full examination was performed including scalp, head, eyes, ears, nose, lips, neck, chest, axillae, abdomen, back, buttocks, bilateral upper extremities, bilateral lower extremities, hands, feet, fingers, toes, fingernails, and toenails. All findings within normal limits unless otherwise noted below.   Relevant physical exam findings are noted in the Assessment and Plan.    Assessment & Plan   SKIN CANCER SCREENING PERFORMED TODAY.  ACTINIC DAMAGE - Chronic condition, secondary to cumulative UV/sun exposure - diffuse scaly erythematous macules with underlying dyspigmentation - Recommend daily broad spectrum sunscreen SPF 30+ to sun-exposed areas, reapply every 2 hours as needed.  - Staying in the shade or wearing long sleeves, sun glasses (UVA+UVB protection) and wide brim hats (4-inch brim around the entire circumference of the hat) are also recommended for sun protection.  - Call for new or changing lesions.  LENTIGINES, SEBORRHEIC KERATOSES, HEMANGIOMAS - Benign normal skin lesions - Benign-appearing - Call for any changes  MELANOCYTIC NEVI - Tan-brown and/or pink-flesh-colored symmetric macules and papules - Benign appearing on exam today - Observation - Call clinic for new or changing moles - Recommend daily  use of broad spectrum spf 30+ sunscreen to sun-exposed areas.    Return in about 2 years (around 12/31/2025) for TBSC.  I, Berwyn Lesches, Surg Tech III, am acting as scribe for RUFUS CHRISTELLA HOLY, MD.   Documentation: I have reviewed the above documentation for accuracy and completeness, and I agree with the above.  RUFUS CHRISTELLA HOLY, MD

## 2024-01-06 ENCOUNTER — Ambulatory Visit (INDEPENDENT_AMBULATORY_CARE_PROVIDER_SITE_OTHER): Admitting: Clinical

## 2024-01-06 DIAGNOSIS — F840 Autistic disorder: Secondary | ICD-10-CM

## 2024-01-06 NOTE — Progress Notes (Signed)
 Time: 11:00 am-11:57 am CPT Code: 09162E Diagnosis: F84.0  Jill Alexander was seen in person for therapy. Session focused on continuing to process her grandmother's passing. Jill Alexander expressed confusion at not being able to process her feelings with her parents as she would hope to, and therapist offered alternate perspectives, as well as psychoeducation on grief. She is scheduled to be seen again in one week.   Coping Plan 10/09/2023 Watch something funny on TV Listen to a podcast Pet the kitties  Reading some fanfiction Eat an apple (or banana) Eat a piece of chocolate Go for a walk Ask for a hug Read a book or listen to an [employer] a friend (talk to your parents, siblings, a friend) Tell your parents Go to the emergency room  12/03/2023  Therapist contacted Jill Alexander via secure message on 8/5 to check in as to her timeline for IOP and let her know that her next appointment is scheduled for 8/26. Jill Alexander called the office to ask that the therapist complete a form for her. She was notified that the therapist will be out of the office from 8/9-8/17, and to drop off the form by EOD 8/6 if she would like it completed before therapist leaves. Therapist followed up via phone on 8/6, and Jill Alexander let her know that it was ok to complete the form during her next session on 8/26. She had graduated from IOP on 8/6, and assured the therapist that she feels ok to wait until 8/26 for her next appointment, and will reach out if this changes.  Intake Presenting Problem Jill Alexander presented seeking CBT to help her manage difficulties with emotion regulation and reframing negative thought patterns that contribute to anxiety and worry. Jill Alexander has a diagnosis of Autism Spectrum Disorder (ASD). Symptoms feeling nervous or on edge, difficulty controlling worry, worrying about a variety of things, difficulty relaxing, irritability, fears for the future, feelings of hopelessness, difficulty sleeping, feeling tired, appetite  irregularities, poor self-image, passive suicidal ideation without plan or intent (client contracted for safety during initial session) History of Problem  Jill Alexander reported that she moved from [place]  with her family in July of 2018. Since that time, she has been seen by a provider in the area for challenges with emotion regulation and anxiety related to her diagnosis of autism spectrum disorder. Jill Alexander lives with her parents. She has a 30 year-old sister who has struggled with similar issues, as well as a brother who is 30 years older than she is and lives in [place]. Jill Alexander described a long history of therapy and therapeutic programs in order to help her manage these issues, including a partial hospitalization program in December of 2015.  Recent Trigger  Jill Alexander reported that she was seeking a change in pace with regard to her therapeutic treatment in order to ensure continued progress.  Marital and Family Information  Present family concerns/problems: Jill Alexander shared several accounts of disputes with her mother, including her mother becoming upset with her for sharing worries with her immediately after her mother has woken up, despite her mother's requests that she not do so.  Strengths/resources in the family/friends: Jill Alexander described overall supportive relationships with her parents.  Marital/sexual history patterns: N/A  Family of Origin  Problems in family of origin: None reported.  Family background / ethnic factors: none reported.  No needs/concerns related to ethnicity reported when asked: No  Education/Vocation  Interpersonal concerns/problems: Jill Alexander reported that she has difficulties with social interactions related to her ASD diagnosis, and often perseverates about perceived  missteps in both the immediate and distant past.  Personal strengths: Jill Alexander presented as motivated to improve her situation, and with some insight as to what is effective for her.  Military/work problems/concerns: None  noted.  Leisure Activities/Daily Functioning  impaired functioning  Legal Status  No Legal Problems  Medical/Nutritional Concerns  unspecified  Comments: Jill Alexander reported that she often perseverates on perceived medical problems, and described herself as a hypochondriac  Religion/Spirituality  Not reported  Other  General Behavior: cooperative  Attire: appropriate  Gait: normal  Motor Activity: normal  Stream of Thought - Productivity: spontaneous  Stream of thought - Progression: normal  Stream of thought - Language: normal  Emotional tone and reactions - Affect: appropriate  Mental trend/Content of thoughts - Perception: normal  Mental trend/Content of thoughts - Orientation: normal  Mental trend/Content of thoughts - Memory: normal  Mental trend/Content of thoughts - General knowledge: consistent with education  Insight: good  Judgment: good  Intelligence: average  Diagnostic Summary  Autism Spectrum Disorder (F84.0)   [facility] Counselor/Therapist Progress Note   Patient ID: Jill Alexander, MRN: [id number],     Individualized Treatment Plan Strengths: Enjoys reading, Seeking counseling, In training program through [employer], I like humor and jokes  Supports: Her dog, her bestfriend since childhood, her parents, her sister    Goal/Needs for Treatment:  In order of importance to patient 1) Emotional Regulation/Anxiety 2) Improve Positive self talk      Client Statement of Needs: Find ways of being nicer to myself, cope w/ anxiety and worry    Treatment Level:Outpatient Counseling  Symptoms:Anxiety, Perseverative worry, Negative self talk  Client Treatment Preferences:weekly counseling,  Prefers to be called Jill Alexander    Healthcare consumer's goal for treatment:  Dr. Tashonna Descoteaux will support the patient's ability to achieve the goals identified. Cognitive Behavioral Therapy, Supportive Counseling, Mindfulness and Relaxation Strategies and other  evidenced-based practices will be used to promote progress towards healthy functioning.    Healthcare consumer will: Actively participate in therapy, working towards healthy functioning.     *Justification for Continuation/Discontinuation of Goal: R=Revised, O=Ongoing, A=Achieved, D=Discontinued   Goal 1) Jill Alexander will develop strategies to regulate her emotions and improve her positive self talk   Objective: Jill Alexander will incorporate her coping skills into her routine and use them more effectively and consistently   Likert rating baseline date 04/12/21: How Easy - 3; How Often - 50% Target Date Goal Was reviewed Status Code Progress towards goal/Likert rating  05/05/2024  05/06/2023 updated Ongoing                      Goal 2: Jill Alexander will improve communication with others, especially close family members, by working on her tendency to assume the worst.   Target Date: 05/05/2024  Interventions: Therapist will help Jill Alexander to notice and disengage from maladaptive thoughts and behaviors using CBT based strategies Jill Alexander will have opportunities to process her experiences in session Therapist will engage Jill Alexander in discussion of coping strategies and creation of a plan to use them regularly Therapist will engage Jill Alexander in regular mood checks to continue to monitor her general mental health, prioritizing this at the start of the session to ensure it is covered   Therapist will provide additional resources as appropriate   Jill Alexander participated in development of tx plan and provided verbal consent.      Sritha Chauncey L Leilanie Rauda, PhD  Brynden Thune L Dravon Nott, PhD

## 2024-01-12 ENCOUNTER — Other Ambulatory Visit (HOSPITAL_COMMUNITY): Payer: Self-pay

## 2024-01-12 ENCOUNTER — Ambulatory Visit: Admitting: Neurology

## 2024-01-12 DIAGNOSIS — G935 Compression of brain: Secondary | ICD-10-CM

## 2024-01-12 DIAGNOSIS — G43019 Migraine without aura, intractable, without status migrainosus: Secondary | ICD-10-CM

## 2024-01-12 DIAGNOSIS — F840 Autistic disorder: Secondary | ICD-10-CM

## 2024-01-13 ENCOUNTER — Ambulatory Visit: Admitting: Clinical

## 2024-01-13 DIAGNOSIS — F840 Autistic disorder: Secondary | ICD-10-CM

## 2024-01-13 NOTE — Progress Notes (Signed)
 Time: 11:00 am-11:57 am CPT Code: 09162E Diagnosis: F84.0  Jill Alexander was seen in person for therapy. She had reached out to the therapist via email to ask for a letter to be exempt for jury duty, and session began by considering what might be included in such a letter. She provided verbal authorization (with written authorization already on file) for therapist to reach out to her mother for input as well. She also shared about two day programs she has been exploring, and therapist offered validation and support. She is scheduled to be seen again in one week.   Coping Plan 10/09/2023 Watch something funny on TV Listen to a podcast Pet the kitties  Reading some fanfiction Eat an apple (or banana) Eat a piece of chocolate Go for a walk Ask for a hug Read a book or listen to an [employer] a friend (talk to your parents, siblings, a friend) Tell your parents Go to the emergency room  12/03/2023  Therapist contacted Jill Alexander via secure message on 8/5 to check in as to her timeline for IOP and let her know that her next appointment is scheduled for 8/26. Jill Alexander called the office to ask that the therapist complete a form for her. She was notified that the therapist will be out of the office from 8/9-8/17, and to drop off the form by EOD 8/6 if she would like it completed before therapist leaves. Therapist followed up via phone on 8/6, and Jill Alexander let her know that it was ok to complete the form during her next session on 8/26. She had graduated from IOP on 8/6, and assured the therapist that she feels ok to wait until 8/26 for her next appointment, and will reach out if this changes.  Intake Presenting Problem Jill Alexander presented seeking CBT to help her manage difficulties with emotion regulation and reframing negative thought patterns that contribute to anxiety and worry. Jill Alexander has a diagnosis of Autism Spectrum Disorder (ASD). Symptoms feeling nervous or on edge, difficulty controlling worry, worrying  about a variety of things, difficulty relaxing, irritability, fears for the future, feelings of hopelessness, difficulty sleeping, feeling tired, appetite irregularities, poor self-image, passive suicidal ideation without plan or intent (client contracted for safety during initial session) History of Problem  Jill Alexander reported that she moved from [place]  with her family in July of 2018. Since that time, she has been seen by a provider in the area for challenges with emotion regulation and anxiety related to her diagnosis of autism spectrum disorder. Jill Alexander lives with her parents. She has a 30 year-old sister who has struggled with similar issues, as well as a brother who is three years older than she is and lives in [place]. Jill Alexander described a long history of therapy and therapeutic programs in order to help her manage these issues, including a partial hospitalization program in December of 2015.  Recent Trigger  Jill Alexander reported that she was seeking a change in pace with regard to her therapeutic treatment in order to ensure continued progress.  Marital and Family Information  Present family concerns/problems: Jill Alexander shared several accounts of disputes with her mother, including her mother becoming upset with her for sharing worries with her immediately after her mother has woken up, despite her mother's requests that she not do so.  Strengths/resources in the family/friends: Jill Alexander described overall supportive relationships with her parents.  Marital/sexual history patterns: N/A  Family of Origin  Problems in family of origin: None reported.  Family background / ethnic factors: none reported.  No needs/concerns related to ethnicity reported when asked: No  Education/Vocation  Interpersonal concerns/problems: Jill Alexander reported that she has difficulties with social interactions related to her ASD diagnosis, and often perseverates about perceived missteps in both the immediate and distant past.  Personal  strengths: Jill Alexander presented as motivated to improve her situation, and with some insight as to what is effective for her.  Military/work problems/concerns: None noted.  Leisure Activities/Daily Functioning  impaired functioning  Legal Status  No Legal Problems  Medical/Nutritional Concerns  unspecified  Comments: Jill Alexander reported that she often perseverates on perceived medical problems, and described herself as a hypochondriac  Religion/Spirituality  Not reported  Other  General Behavior: cooperative  Attire: appropriate  Gait: normal  Motor Activity: normal  Stream of Thought - Productivity: spontaneous  Stream of thought - Progression: normal  Stream of thought - Language: normal  Emotional tone and reactions - Affect: appropriate  Mental trend/Content of thoughts - Perception: normal  Mental trend/Content of thoughts - Orientation: normal  Mental trend/Content of thoughts - Memory: normal  Mental trend/Content of thoughts - General knowledge: consistent with education  Insight: good  Judgment: good  Intelligence: average  Diagnostic Summary  Autism Spectrum Disorder (F84.0)   [facility] Counselor/Therapist Progress Note   Patient ID: Jill Alexander, MRN: [id number],     Individualized Treatment Plan Strengths: Enjoys reading, Seeking counseling, In training program through [employer], I like humor and jokes  Supports: Her dog, her bestfriend since childhood, her parents, her sister    Goal/Needs for Treatment:  In order of importance to patient 1) Emotional Regulation/Anxiety 2) Improve Positive self talk      Client Statement of Needs: Find ways of being nicer to myself, cope w/ anxiety and worry    Treatment Level:Outpatient Counseling  Symptoms:Anxiety, Perseverative worry, Negative self talk  Client Treatment Preferences:weekly counseling,  Prefers to be called Jill Alexander    Healthcare consumer's goal for treatment:  Dr. Tienna Bienkowski will  support the patient's ability to achieve the goals identified. Cognitive Behavioral Therapy, Supportive Counseling, Mindfulness and Relaxation Strategies and other evidenced-based practices will be used to promote progress towards healthy functioning.    Healthcare consumer will: Actively participate in therapy, working towards healthy functioning.     *Justification for Continuation/Discontinuation of Goal: R=Revised, O=Ongoing, A=Achieved, D=Discontinued   Goal 1) Jill Alexander will develop strategies to regulate her emotions and improve her positive self talk   Objective: Jill Alexander will incorporate her coping skills into her routine and use them more effectively and consistently   Likert rating baseline date 04/12/21: How Easy - 3; How Often - 50% Target Date Goal Was reviewed Status Code Progress towards goal/Likert rating  05/05/2024  05/06/2023 updated Ongoing                      Goal 2: Jill Alexander will improve communication with others, especially close family members, by working on her tendency to assume the worst.   Target Date: 05/05/2024  Interventions: Therapist will help Jill Alexander to notice and disengage from maladaptive thoughts and behaviors using CBT based strategies Jill Alexander will have opportunities to process her experiences in session Therapist will engage Jill Alexander in discussion of coping strategies and creation of a plan to use them regularly Therapist will engage Jill Alexander in regular mood checks to continue to monitor her general mental health, prioritizing this at the start of the session to ensure it is covered   Therapist will provide additional resources as appropriate  Jill Alexander participated in development of tx plan and provided verbal consent.         Jill Stahly L Sriram Febles, PhD               Jill LITTIE Ponto, PhD

## 2024-01-15 ENCOUNTER — Telehealth: Payer: Self-pay | Admitting: *Deleted

## 2024-01-15 NOTE — Telephone Encounter (Signed)
 Returned call from 1:48 PM. Left patient a message to call and schedule.

## 2024-01-18 ENCOUNTER — Other Ambulatory Visit (HOSPITAL_COMMUNITY): Payer: Self-pay

## 2024-01-19 ENCOUNTER — Other Ambulatory Visit: Payer: Self-pay | Admitting: Neurology

## 2024-01-19 ENCOUNTER — Other Ambulatory Visit (HOSPITAL_COMMUNITY): Payer: Self-pay

## 2024-01-19 ENCOUNTER — Other Ambulatory Visit: Payer: Self-pay

## 2024-01-19 MED ORDER — RIZATRIPTAN BENZOATE 10 MG PO TABS
10.0000 mg | ORAL_TABLET | ORAL | 11 refills | Status: DC | PRN
  Filled 2024-01-19: qty 10, 30d supply, fill #0

## 2024-01-19 MED ORDER — LORAZEPAM 1 MG PO TABS
1.0000 mg | ORAL_TABLET | Freq: Two times a day (BID) | ORAL | 0 refills | Status: DC | PRN
  Filled 2024-01-25: qty 60, 30d supply, fill #0

## 2024-01-20 ENCOUNTER — Encounter: Payer: Self-pay | Admitting: Pharmacist

## 2024-01-20 ENCOUNTER — Other Ambulatory Visit: Payer: Self-pay

## 2024-01-20 ENCOUNTER — Other Ambulatory Visit (HOSPITAL_COMMUNITY): Payer: Self-pay

## 2024-01-21 ENCOUNTER — Ambulatory Visit: Admitting: Clinical

## 2024-01-21 ENCOUNTER — Ambulatory Visit (INDEPENDENT_AMBULATORY_CARE_PROVIDER_SITE_OTHER): Admitting: Clinical

## 2024-01-21 DIAGNOSIS — F840 Autistic disorder: Secondary | ICD-10-CM | POA: Diagnosis not present

## 2024-01-21 NOTE — Progress Notes (Signed)
 Time: 11:00 am-11:57 am CPT Code: 09162E Diagnosis: F84.0  Jill Alexander was seen in person for therapy. She reported a continued increase in depression, but denied suicidal ideation. Session focused on processing whether her upcoming 28th birthday plays a role, and she agreed that it does. She is scheduled to be seen again in one week.   Coping Plan 10/09/2023 Watch something funny on TV Listen to a podcast Pet the kitties  Reading some fanfiction Eat an apple (or banana) Eat a piece of chocolate Go for a walk Ask for a hug Read a book or listen to an [employer] a friend (talk to your parents, siblings, a friend) Tell your parents Go to the emergency room  12/03/2023  Therapist contacted Jill Alexander via secure message on 8/5 to check in as to her timeline for IOP and let her know that her next appointment is scheduled for 8/26. Jill Alexander called the office to ask that the therapist complete a form for her. She was notified that the therapist will be out of the office from 8/9-8/17, and to drop off the form by EOD 8/6 if she would like it completed before therapist leaves. Therapist followed up via phone on 8/6, and Jill Alexander let her know that it was ok to complete the form during her next session on 8/26. She had graduated from IOP on 8/6, and assured the therapist that she feels ok to wait until 8/26 for her next appointment, and will reach out if this changes.  Intake Presenting Problem Jill Alexander presented seeking CBT to help her manage difficulties with emotion regulation and reframing negative thought patterns that contribute to anxiety and worry. Jill Alexander has a diagnosis of Autism Spectrum Disorder (ASD). Symptoms feeling nervous or on edge, difficulty controlling worry, worrying about a variety of things, difficulty relaxing, irritability, fears for the future, feelings of hopelessness, difficulty sleeping, feeling tired, appetite irregularities, poor self-image, passive suicidal ideation without plan or  intent (client contracted for safety during initial session) History of Problem  Jill Alexander reported that she moved from [place]  with her family in July of 2018. Since that time, she has been seen by a provider in the area for challenges with emotion regulation and anxiety related to her diagnosis of autism spectrum disorder. Jill Alexander lives with her parents. She has a 22 year-old sister who has struggled with similar issues, as well as a brother who is three years older than she is and lives in [place]. Jill Alexander described a long history of therapy and therapeutic programs in order to help her manage these issues, including a partial hospitalization program in December of 2015.  Recent Trigger  Jill Alexander reported that she was seeking a change in pace with regard to her therapeutic treatment in order to ensure continued progress.  Marital and Family Information  Present family concerns/problems: Jill Alexander shared several accounts of disputes with her mother, including her mother becoming upset with her for sharing worries with her immediately after her mother has woken up, despite her mother's requests that she not do so.  Strengths/resources in the family/friends: Jill Alexander described overall supportive relationships with her parents.  Marital/sexual history patterns: N/A  Family of Origin  Problems in family of origin: None reported.  Family background / ethnic factors: none reported.  No needs/concerns related to ethnicity reported when asked: No  Education/Vocation  Interpersonal concerns/problems: Jill Alexander reported that she has difficulties with social interactions related to her ASD diagnosis, and often perseverates about perceived missteps in both the immediate and distant past.  Personal  strengths: Jill Alexander presented as motivated to improve her situation, and with some insight as to what is effective for her.  Military/work problems/concerns: None noted.  Leisure Activities/Daily Functioning  impaired functioning  Legal  Status  No Legal Problems  Medical/Nutritional Concerns  unspecified  Comments: Jill Alexander reported that she often perseverates on perceived medical problems, and described herself as a hypochondriac  Religion/Spirituality  Not reported  Other  General Behavior: cooperative  Attire: appropriate  Gait: normal  Motor Activity: normal  Stream of Thought - Productivity: spontaneous  Stream of thought - Progression: normal  Stream of thought - Language: normal  Emotional tone and reactions - Affect: appropriate  Mental trend/Content of thoughts - Perception: normal  Mental trend/Content of thoughts - Orientation: normal  Mental trend/Content of thoughts - Memory: normal  Mental trend/Content of thoughts - General knowledge: consistent with education  Insight: good  Judgment: good  Intelligence: average  Diagnostic Summary  Autism Spectrum Disorder (F84.0)   [facility] Counselor/Therapist Progress Note   Patient ID: Saphia Vanderford, MRN: [id number],     Individualized Treatment Plan Strengths: Enjoys reading, Seeking counseling, In training program through [employer], I like humor and jokes  Supports: Her dog, her bestfriend since childhood, her parents, her sister    Goal/Needs for Treatment:  In order of importance to patient 1) Emotional Regulation/Anxiety 2) Improve Positive self talk      Client Statement of Needs: Find ways of being nicer to myself, cope w/ anxiety and worry    Treatment Level:Outpatient Counseling  Symptoms:Anxiety, Perseverative worry, Negative self talk  Client Treatment Preferences:weekly counseling,  Prefers to be called Jill Alexander    Healthcare consumer's goal for treatment:  Dr. Zuleyka Kloc will support the patient's ability to achieve the goals identified. Cognitive Behavioral Therapy, Supportive Counseling, Mindfulness and Relaxation Strategies and other evidenced-based practices will be used to promote progress towards healthy  functioning.    Healthcare consumer will: Actively participate in therapy, working towards healthy functioning.     *Justification for Continuation/Discontinuation of Goal: R=Revised, O=Ongoing, A=Achieved, D=Discontinued   Goal 1) Jill Alexander will develop strategies to regulate her emotions and improve her positive self talk   Objective: Jill Alexander will incorporate her coping skills into her routine and use them more effectively and consistently   Likert rating baseline date 04/12/21: How Easy - 3; How Often - 50% Target Date Goal Was reviewed Status Code Progress towards goal/Likert rating  05/05/2024  05/06/2023 updated Ongoing                      Goal 2: Jill Alexander will improve communication with others, especially close family members, by working on her tendency to assume the worst.   Target Date: 05/05/2024  Interventions: Therapist will help Jill Alexander to notice and disengage from maladaptive thoughts and behaviors using CBT based strategies Jill Alexander will have opportunities to process her experiences in session Therapist will engage Jill Alexander in discussion of coping strategies and creation of a plan to use them regularly Therapist will engage Jill Alexander in regular mood checks to continue to monitor her general mental health, prioritizing this at the start of the session to ensure it is covered   Therapist will provide additional resources as appropriate   Jill Alexander participated in development of tx plan and provided verbal consent.         Tayte Childers L Elimelech Houseman, PhD               Andriette LITTIE Ponto, PhD  Cordai Rodrigue L Izabellah Dadisman, PhD

## 2024-01-22 ENCOUNTER — Ambulatory Visit: Admitting: Clinical

## 2024-01-22 ENCOUNTER — Other Ambulatory Visit (HOSPITAL_COMMUNITY): Payer: Self-pay

## 2024-01-23 ENCOUNTER — Other Ambulatory Visit: Payer: Self-pay

## 2024-01-26 ENCOUNTER — Other Ambulatory Visit (HOSPITAL_COMMUNITY): Payer: Self-pay

## 2024-01-27 ENCOUNTER — Other Ambulatory Visit (HOSPITAL_COMMUNITY): Payer: Self-pay

## 2024-01-27 ENCOUNTER — Other Ambulatory Visit: Payer: Self-pay

## 2024-01-27 ENCOUNTER — Ambulatory Visit: Admitting: Clinical

## 2024-01-27 DIAGNOSIS — F840 Autistic disorder: Secondary | ICD-10-CM

## 2024-01-27 NOTE — Progress Notes (Signed)
 Time: 11:00 am-11:58 am CPT Code: 09162E Diagnosis: F84.0  Jill Alexander was seen in person for therapy. She reported continuing to feel down and a tendency to catastrophize. Therapist offered validation and support, encouraging her to consider activities that can add structure and purpose to her routine and drawing upon past experience to engage her in consideration of what she might need to anticipate. She is scheduled to be seen again in one week.   Coping Plan 10/09/2023 Watch something funny on TV Listen to a podcast Pet the kitties  Reading some fanfiction Eat an apple (or banana) Eat a piece of chocolate Go for a walk Ask for a hug Read a book or listen to an [employer] [facility] a friend (talk to your parents, siblings, a friend) Tell your parents Go to the emergency room  12/03/2023  Therapist contacted Jill Alexander via secure message on 8/5 to check in as to her timeline for IOP and let her know that her next appointment is scheduled for 8/26. Jill Alexander called the office to ask that the therapist complete a form for her. She was notified that the therapist will be out of the office from 8/9-8/17, and to drop off the form by EOD 8/6 if she would like it completed before therapist leaves. Therapist followed up via phone on 8/6, and Jill Alexander let her know that it was ok to complete the form during her next session on 8/26. She had graduated from IOP on 8/6, and assured the therapist that she feels ok to wait until 8/26 for her next appointment, and will reach out if this changes.  Intake Presenting Problem Jill Alexander presented seeking CBT to help her manage difficulties with emotion regulation and reframing negative thought patterns that contribute to anxiety and worry. Jill Alexander has a diagnosis of Autism Spectrum Disorder (ASD). Symptoms feeling nervous or on edge, difficulty controlling worry, worrying about a variety of things, difficulty relaxing, irritability, fears for the future, feelings of hopelessness,  difficulty sleeping, feeling tired, appetite irregularities, poor self-image, passive suicidal ideation without plan or intent (client contracted for safety during initial session) History of Problem  Jill Alexander reported that she moved from [place]  with her family in July of 2018. Since that time, she has been seen by a provider in the area for challenges with emotion regulation and anxiety related to her diagnosis of autism spectrum disorder. Jill Alexander lives with her parents. She has a 38 year-old sister who has struggled with similar issues, as well as a brother who is three years older than she is and lives in [place]. Jill Alexander described a long history of therapy and therapeutic programs in order to help her manage these issues, including a partial hospitalization program in December of 2015.  Recent Trigger  Jill Alexander reported that she was seeking a change in pace with regard to her therapeutic treatment in order to ensure continued progress.  Marital and Family Information  Present family concerns/problems: Jill Alexander shared several accounts of disputes with her mother, including her mother becoming upset with her for sharing worries with her immediately after her mother has woken up, despite her mother's requests that she not do so.  Strengths/resources in the family/friends: Jill Alexander described overall supportive relationships with her parents.  Marital/sexual history patterns: N/A  Family of Origin  Problems in family of origin: None reported.  Family background / ethnic factors: none reported.  No needs/concerns related to ethnicity reported when asked: No  Education/Vocation  Interpersonal concerns/problems: Jill Alexander reported that she has difficulties with social interactions related to  her ASD diagnosis, and often perseverates about perceived missteps in both the immediate and distant past.  Personal strengths: Jill Alexander presented as motivated to improve her situation, and with some insight as to what is effective for  her.  Military/work problems/concerns: None noted.  Leisure Activities/Daily Functioning  impaired functioning  Legal Status  No Legal Problems  Medical/Nutritional Concerns  unspecified  Comments: Jill Alexander reported that she often perseverates on perceived medical problems, and described herself as a hypochondriac  Religion/Spirituality  Not reported  Other  General Behavior: cooperative  Attire: appropriate  Gait: normal  Motor Activity: normal  Stream of Thought - Productivity: spontaneous  Stream of thought - Progression: normal  Stream of thought - Language: normal  Emotional tone and reactions - Affect: appropriate  Mental trend/Content of thoughts - Perception: normal  Mental trend/Content of thoughts - Orientation: normal  Mental trend/Content of thoughts - Memory: normal  Mental trend/Content of thoughts - General knowledge: consistent with education  Insight: good  Judgment: good  Intelligence: average  Diagnostic Summary  Autism Spectrum Disorder (F84.0)    Behavioral Health Counselor/Therapist Progress Note   Patient ID: Aluel Schwarz, MRN: [id number],     Individualized Treatment Plan Strengths: Enjoys reading, Seeking counseling, In training program through [employer], I like humor and jokes  Supports: Her dog, her bestfriend since childhood, her parents, her sister    Goal/Needs for Treatment:  In order of importance to patient 1) Emotional Regulation/Anxiety 2) Improve Positive self talk      Client Statement of Needs: Find ways of being nicer to myself, cope w/ anxiety and worry    Treatment Level:Outpatient Counseling  Symptoms:Anxiety, Perseverative worry, Negative self talk  Client Treatment Preferences:weekly counseling,  Prefers to be called Jill Alexander    Healthcare consumer's goal for treatment:  Dr. Arshi Duarte will support the patient's ability to achieve the goals identified. Cognitive Behavioral Therapy, Supportive Counseling,  Mindfulness and Relaxation Strategies and other evidenced-based practices will be used to promote progress towards healthy functioning.    Healthcare consumer will: Actively participate in therapy, working towards healthy functioning.     *Justification for Continuation/Discontinuation of Goal: R=Revised, O=Ongoing, A=Achieved, D=Discontinued   Goal 1) Jill Alexander will develop strategies to regulate her emotions and improve her positive self talk   Objective: Jill Alexander will incorporate her coping skills into her routine and use them more effectively and consistently   Likert rating baseline date 04/12/21: How Easy - 3; How Often - 50% Target Date Goal Was reviewed Status Code Progress towards goal/Likert rating  05/05/2024  05/06/2023 updated Ongoing                      Goal 2: Jill Alexander will improve communication with others, especially close family members, by working on her tendency to assume the worst.   Target Date: 05/05/2024  Interventions: Therapist will help Jill Alexander to notice and disengage from maladaptive thoughts and behaviors using CBT based strategies Jill Alexander will have opportunities to process her experiences in session Therapist will engage Jill Alexander in discussion of coping strategies and creation of a plan to use them regularly Therapist will engage Jill Alexander in regular mood checks to continue to monitor her general mental health, prioritizing this at the start of the session to ensure it is covered   Therapist will provide additional resources as appropriate   Jill Alexander participated in development of tx plan and provided verbal consent.         Cooper Moroney L Jayleon Mcfarlane, PhD  Quante Pettry L Rissa Turley, PhD               Kaleigh Spiegelman L Darleene Cumpian, PhD               Andriette LITTIE Ponto, PhD

## 2024-01-29 ENCOUNTER — Other Ambulatory Visit (HOSPITAL_COMMUNITY): Payer: Self-pay

## 2024-02-03 ENCOUNTER — Ambulatory Visit: Admitting: Clinical

## 2024-02-03 ENCOUNTER — Other Ambulatory Visit (HOSPITAL_COMMUNITY): Payer: Self-pay

## 2024-02-03 ENCOUNTER — Ambulatory Visit: Admitting: Neurology

## 2024-02-03 ENCOUNTER — Encounter: Payer: Self-pay | Admitting: Neurology

## 2024-02-03 VITALS — BP 101/67 | HR 88 | Ht 62.0 in | Wt 129.6 lb

## 2024-02-03 DIAGNOSIS — G4719 Other hypersomnia: Secondary | ICD-10-CM | POA: Diagnosis not present

## 2024-02-03 DIAGNOSIS — F840 Autistic disorder: Secondary | ICD-10-CM | POA: Diagnosis not present

## 2024-02-03 DIAGNOSIS — Z8669 Personal history of other diseases of the nervous system and sense organs: Secondary | ICD-10-CM

## 2024-02-03 MED ORDER — RIZATRIPTAN BENZOATE 10 MG PO TABS
10.0000 mg | ORAL_TABLET | ORAL | 11 refills | Status: AC | PRN
  Filled 2024-02-03: qty 10, 30d supply, fill #0
  Filled 2024-03-10: qty 10, 30d supply, fill #1
  Filled 2024-04-06: qty 10, 30d supply, fill #2
  Filled 2024-05-18: qty 10, 30d supply, fill #3
  Filled 2024-05-31: qty 10, 30d supply, fill #0
  Filled 2024-05-31: qty 10, 30d supply, fill #3

## 2024-02-03 MED ORDER — TOPIRAMATE 50 MG PO TABS
50.0000 mg | ORAL_TABLET | Freq: Every day | ORAL | 3 refills | Status: AC
  Filled 2024-02-03: qty 90, 90d supply, fill #0

## 2024-02-03 NOTE — Progress Notes (Signed)
 Provider:  Dedra Gores, MD  Primary Care Physician:  Cristopher Bottcher, NP [phone] [name] [place] [address]     Referring Provider: Cristopher Bottcher, Np [phone] [address] [place],  [place] [address]          Chief Complaint according to patient   Patient presents with:                HISTORY OF PRESENT ILLNESS:  Jill Alexander is a 28 y.o. female patient who is here for revisit 02/03/2024 for  interrupted sleep (  longstanding), often her sleep is interrupted by a 1 hour period of wakefulness as it is common I patients with autism.   Chief concern according to patient : Pt states she has been doing okay since her last appointment. Pt would like to discuss her headaches ( possible caused by Chiari Malformation? ).  Variable pattern , some stress induced tension the forehead, some migrainous ( with some nausea and visual changes , little dots).   Some neck tension pain in AM, but not evolving into headaches.   Tension headaches are present < 1 -2 week Migraines are also frequent 1-2 / week , no status migrainosus.   I asked the patient to start keeping a HA journal.      Review of Systems: Out of a complete 14 system review, the patient complains of only the following symptoms, and all other reviewed systems are negative.:   SLEEPINESS ?  How likely are you to doze in the following situations: 0 = not likely, 1 = slight chance, 2 = moderate chance, 3 = high chance  Sitting and Reading? Watching Television? Sitting inactive in a public place (theater or meeting)? Lying down in the afternoon when circumstances permit? Sitting and talking to someone? Sitting quietly after lunch without alcohol? In a car, while stopped for a few minutes in traffic? As a passenger in a car for an hour without a break?  Total = NA   05-09-2022 Dr. Buck reviewed your sleep study since Dr. Gores is out on leave. Dr. Buck is our other sleep  specialist here. She said your recent sleep study did not show any significant obstructive sleep apnea. It showed you had snoring, which ranged from mild to loud. With this finding, she said a CPAP machine is not indicated. If you find the snoring bothersome, an oral appliance (through a qualified dentist) can be considered she said. This will help reposition your jaw to help improve the snoring. If you would like this route, please let us  know and we can place a referral to a dentist that does this.   She also stated you had brief sleep talking episodes during your dream sleep with mild leg movements. You did not have any flailing or larger movements though.    She said we can set you up a follow up with Dr. Gores to further discuss next steps if you would like. You can call the office at [phone] to schedule this if you would like to get this set up.    Social History   Socioeconomic History   Marital status: Single    Spouse name: Not on file   Number of children: 0   Years of education: Not on file   Highest education level: Some college, no degree  Occupational History   Not on file  Tobacco Use   Smoking status: Never  Smokeless tobacco: Never  Vaping Use   Vaping status: Never Used  Substance and Sexual Activity   Alcohol use: Yes    Comment: 1 cocktail a few times a year   Drug use: No   Sexual activity: Not Currently    Birth control/protection: Pill  Other Topics Concern   Not on file  Social History Narrative   Lives with parents in [place] with her mom and dad. Pt moved here from [place]  in 2018.      Siblings- Pt is the youngest of out 3 siblings. 1 sister 9 years older than her, 1 brother who is 3 years older than her.    Schooling- started college right after HS graduation and stopped after a year   Married- denies    Kids- denies    Legal issues- denies       Caffeine use: 2-3 cups per day   Left handed    Social Drivers of Research scientist (physical sciences) Strain: Low Risk  (05/27/2018)   Overall Financial Resource Strain (CARDIA)    Difficulty of Paying Living Expenses: Not hard at all  Food Insecurity: No Food Insecurity (04/14/2023)   Hunger Vital Sign    Worried About Running Out of Food in the Last Year: Never true    Ran Out of Food in the Last Year: Never true  Transportation Needs: Unmet Transportation Needs (06/29/2018)   PRAPARE - Transportation    Lack of Transportation (Medical): Yes    Lack of Transportation (Non-Medical): Yes  Physical Activity: Unknown (06/29/2018)   Exercise Vital Sign    Days of Exercise per Week: 3 days    Minutes of Exercise per Session: Not on file  Recent Concern: Physical Activity - Insufficiently Active (05/27/2018)   Exercise Vital Sign    Days of Exercise per Week: 2 days    Minutes of Exercise per Session: 30 min  Stress: Stress Concern Present (06/29/2018)   [employer] of Occupational Health - Occupational Stress Questionnaire    Feeling of Stress : Rather much  Social Connections: Unknown (06/29/2018)   Social Connection and Isolation Panel    Frequency of Communication with Friends and Family: Not on file    Frequency of Social Gatherings with Friends and Family: Not on file    Attends Religious Services: Not on file    Active Member of Clubs or Organizations: Not on file    Attends Banker Meetings: Not on file    Marital Status: Never married    Family History  Problem Relation Age of Onset   Depression Father    Depression Sister    Anxiety disorder Sister    Diabetes Maternal Grandfather    Cancer Maternal Grandfather    Breast cancer Paternal Grandmother    Uterine cancer Paternal Grandmother    Irritable bowel syndrome Paternal Grandmother    Colon cancer Neg Hx    Esophageal cancer Neg Hx    Stomach cancer Neg Hx    Rectal cancer Neg Hx     Past Medical History:  Diagnosis Date   Anxiety    Autism    Chronic nausea    Common migraine with  intractable migraine 07/01/2019   Depression    Dyspepsia    GERD (gastroesophageal reflux disease)    Headache    Heart murmur    Seizures (HCC)    Tremor, 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    Past Surgical History:  Procedure Laterality Date   none  Current Outpatient Medications on File Prior to Visit  Medication Sig Dispense Refill   acetaminophen (TYLENOL) 500 MG tablet Take 500 mg by mouth every 6 (six) hours as needed.     Dextromethorphan -buPROPion  ER (AUVELITY ) 45-105 MG TBCR Take 1 tablet by mouth 2 (two) times daily. 60 tablet 5   Docusate Sodium (COLACE PO) Take by mouth.     drospirenone -ethinyl estradiol  (YAZ) 3-0.02 MG tablet Take 1 tablet by mouth daily. 28 tablet 4   DULoxetine  (CYMBALTA ) 60 MG capsule Take 2 capsules (120 mg total) by mouth daily. 60 capsule 1   Famotidine (PEPCID PO) Take by mouth.     Fremanezumab -vfrm (AJOVY ) 225 MG/1.5ML SOAJ Inject 225 mg into the skin every 30 (thirty) days. 1.5 mL 11   guanFACINE  (INTUNIV ) 1 MG TB24 ER tablet Take 1 tablet (1 mg total) by mouth at bedtime for sleep. 30 tablet 1   Ibuprofen (MOTRIN PO) Take 200 mg by mouth as needed.     lamoTRIgine  (LAMICTAL ) 200 MG tablet Take 2 tablets (400 mg total) by mouth daily. 180 tablet 1   LORazepam  (ATIVAN ) 1 MG tablet Take 1 tablet (1 mg total) by mouth 2 (two) times daily as needed for severe anxiety 60 tablet 1   lurasidone  (LATUDA ) 40 MG TABS tablet Take 1 tablet (40 mg total) by mouth daily with breakfast. 90 tablet 0   Lurasidone  HCl 60 MG TABS Take 1 tablet (60 mg total) by mouth every evening with food. 30 tablet 1   melatonin 1 MG TABS tablet Take 1 tablet (1 mg total) by mouth at bedtime as needed (insomnia). 30 tablet 0   mirabegron  ER (MYRBETRIQ ) 50 MG TB24 tablet Take 1 tablet (50 mg total) by mouth daily. 30 tablet 3   Multiple Vitamins-Minerals (MULTIVITAMIN PO) Take 1 tablet by mouth daily.     pantoprazole  (PROTONIX ) 40 MG tablet Take 1 tablet (40 mg total) by mouth daily.  Please schedule a yearly follow up for further refills. Thank you 30 tablet 2   polyethylene glycol (MIRALAX ) packet Take 17 g by mouth daily as needed. 14 each 0   amoxicillin -clavulanate (AUGMENTIN ) 875-125 MG tablet Take 1 tablet by mouth 2 (two) times daily. (Patient not taking: Reported on 02/03/2024) 14 tablet 0   benzonatate  (TESSALON ) 100 MG capsule Take 1-2 capsules (100-200 mg total) by mouth 3 (three) times daily as needed. (Patient not taking: Reported on 02/03/2024) 30 capsule 0   Caraway Oil-Levomenthol (FDGARD) 25-20.75 MG CAPS Take as needed (Patient not taking: Reported on 02/03/2024)     Dextromethorphan -buPROPion  ER (AUVELITY ) 45-105 MG TBCR Take 1 tablet by mouth 2 (two) times daily. (Patient not taking: Reported on 02/03/2024) 60 tablet 2   Dextromethorphan -buPROPion  ER (AUVELITY ) 45-105 MG TBCR Take 1 tablet by mouth 2 (two) times daily. (Patient not taking: Reported on 02/03/2024) 180 tablet 1   Dextromethorphan -buPROPion  ER (AUVELITY ) 45-105 MG TBCR Take 1 tablet by mouth 2 (two) times daily. (Patient not taking: Reported on 02/03/2024) 60 tablet 2   Dextromethorphan -buPROPion  ER (AUVELITY ) 45-105 MG TBCR Take 1 tablet by mouth 2 (two) times daily. (Patient not taking: Reported on 02/03/2024) 60 tablet 2   Dextromethorphan -buPROPion  ER (AUVELITY ) 45-105 MG TBCR Take 1 tablet by mouth 2 (two) times daily. (Patient not taking: Reported on 02/03/2024) 60 tablet 5   DULoxetine  (CYMBALTA ) 60 MG capsule Take 1 capsule (60 mg total) by mouth daily. (Patient not taking: Reported on 02/03/2024) 90 capsule 1   DULoxetine  (CYMBALTA ) 60 MG capsule  Take 1 capsule by mouth daily. (Patient not taking: Reported on 02/03/2024) 180 capsule 0   guanFACINE  (INTUNIV ) 2 MG TB24 ER tablet Take 1 tablet (2 mg total) by mouth at bedtime for sleep. (Patient not taking: Reported on 02/03/2024) 90 tablet 1   guanFACINE  (INTUNIV ) 2 MG TB24 ER tablet Take 1 tablet (2 mg total) by mouth at bedtime for sleep (Patient not  taking: Reported on 02/03/2024) 30 tablet 1   guanFACINE  (INTUNIV ) 2 MG TB24 ER tablet Take 1 tablet (2 mg total) by mouth at bedtime for sleep. (Patient not taking: Reported on 02/03/2024) 90 tablet 1   LORazepam  (ATIVAN ) 1 MG tablet Take 1 tablet (1 mg total) by mouth 2 (two) times daily as needed for severe anxiety. 60 tablet 1   LORazepam  (ATIVAN ) 1 MG tablet Take 1 tablet (1 mg total) by mouth 2 (two) times daily as needed for severe anxiety. (Patient not taking: Reported on 02/03/2024) 60 tablet 1   LORazepam  (ATIVAN ) 1 MG tablet Take 1 tablet (1 mg total) by mouth 2 (two) times daily as needed for severe anxiety. (Patient not taking: Reported on 02/03/2024) 60 tablet 0   Lurasidone  HCl 60 MG TABS Take 1 tablet (60 mg total) by mouth every evening with food. (Patient not taking: Reported on 02/03/2024) 90 tablet 1   Lurasidone  HCl 60 MG TABS Take 1 tablet (60 mg total) by mouth every evening with food (Patient not taking: Reported on 02/03/2024) 30 tablet 1   Lurasidone  HCl 60 MG TABS Take 1 tablet (60 mg total) by mouth every evening with food. (Patient not taking: Reported on 02/03/2024) 90 tablet 1   mirabegron  ER (MYRBETRIQ ) 50 MG TB24 tablet Take 1 tablet (50 mg total) by mouth daily. (Patient not taking: Reported on 02/03/2024) 90 tablet 3   [DISCONTINUED] Norethin  Ace-Eth Estrad-FE (TAYTULLA ) 1-20 MG-MCG(24) CAPS TAKE 1 CAPSULE BY MOUTH DAILY. 28 capsule 11   No current facility-administered medications on file prior to visit.    Allergies  Allergen Reactions   Gluten Meal      DIAGNOSTIC DATA (LABS, IMAGING, TESTING) - I reviewed patient records, labs, notes, testing and imaging myself where available.  Lab Results  Component Value Date   WBC 6.1 09/25/2021   HGB 13.8 09/25/2021   HCT 40.5 09/25/2021   MCV 92 09/25/2021   PLT 264 09/25/2021      Component Value Date/Time   NA 139 09/25/2021 1630   K 4.1 09/25/2021 1630   CL 107 (H) 09/25/2021 1630   CO2 19 (L) 09/25/2021 1630    GLUCOSE 76 09/25/2021 1630   GLUCOSE 83 07/01/2018 1537   BUN 17 09/25/2021 1630   CREATININE 0.85 09/25/2021 1630   CALCIUM 9.3 09/25/2021 1630   PROT 6.5 09/25/2021 1630   ALBUMIN 4.4 09/25/2021 1630   AST 15 09/25/2021 1630   ALT 13 09/25/2021 1630   ALKPHOS 51 09/25/2021 1630   BILITOT <0.2 09/25/2021 1630   GFRNONAA 122 05/13/2019 1015   GFRAA 141 05/13/2019 1015   No results found for: CHOL, HDL, LDLCALC, LDLDIRECT, TRIG, CHOLHDL Lab Results  Component Value Date   HGBA1C 5.2 07/14/2019   Lab Results  Component Value Date   VITAMINB12 491 01/26/2021   Lab Results  Component Value Date   TSH 0.780 09/25/2021    PHYSICAL EXAM:  Vitals:   02/03/24 1345  BP: 101/67  Pulse: 88  SpO2: 98%   No data found. Body mass index is 23.7 kg/m.  Wt Readings from Last 3 Encounters:  02/03/24 129 lb 9.6 oz (58.8 kg)  07/08/23 129 lb (58.5 kg)  01/07/23 133 lb (60.3 kg)     Ht Readings from Last 3 Encounters:  02/03/24 5' 2 (1.575 m)  07/08/23 5' 2 (1.575 m)  02/13/23 5' 2 (1.575 m)      General: The patient is awake, alert and appears not in acute distress and groomed. Head: Normocephalic, atraumatic.   Small face,   Tremor in both hands,  Neck is supple.  Mallampati 2,  neck circumference: 12 inches .   Nasal airflow  patent.   Overbite Dwan is  seen.  Dental status:  biological  Cardiovascular:  Regular rate and cardiac rhythm by pulse, without distended neck veins. Respiratory: no shortness of breath  Skin:  Without evidence of ankle edema, or rash. Trunk: BMI is 23.7    NEUROLOGIC EXAM: The patient is awake and alert, oriented to place and time.   Memory subjective described as intact.  Attention span & concentration ability appears normal.   Speech is fluent,  without dysarthria, dysphonia or aphasia.  Mood and affect are as usual    Neurological Examination: Mental Status: Intact.  Language and speech are exacting,   not abstracting .  Cranial Nerves II-XII: Intact.  PERL. EOMI. VFF. No nystagmus.  No facial droop.  No ptosis.  Hearing is grossly intact bilaterally.  The tongue is normal and midline. Motor: Strengths are 5/5 throughout. Muscle bulk and tone are normal.  Essential tremors with larger action amplitude. .  Coordination: No ataxia or dysmetria.  Reflexes: Normal and symmetric throughout. No ankle clonus.    ASSESSMENT AND PLAN :   28 y.o. year old female  here with:    1) 2 types of headaches, one type seems to be more tension type , the other more migrainous, both appear 1-2 / week , both have a pain intensity  5-7 / 10,  Duration hours but not exceeding 24 hours.   Used to have > 15 headaches a months.  Patient Discontinued Ajovy , but took maxalt  and topiramate  .    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 Brain MRI was otherwise normal ( March 2024)  This can affect shoulder and neck muscle tone and sensation in the upper extremities.I doubt the correlation with headaches, especially in AM. .   3) medication  side effect ?  Unclear if forgetting to take any meds may have let to HA rebounding.   PLan : HA journal; prn revisit,  I provided refills of TPM  50 mg daily and of Maxalt  10 a month. 11 refills.  RV prn in 12 months.    I would like to thank  Cristopher Bottcher, Np [address] [place],  [place] [address] for allowing me to meet with this pleasant patient.   The patient's condition requires frequent monitoring and adjustments in the treatment plan, reflecting the ongoing complexity of care.  This provider is the continuing focal point for all needed services for this condition.  After spending a total time of  35  minutes face to face and time for  history taking, physical and neurologic examination, review of laboratory studies,  personal review of imaging studies, reports and results of other testing and review of referral information / records  as far as provided in visit,   Electronically signed by: Dedra Gores, MD 02/03/2024 2:35 PM  [facility] [employer]  Sleep Board certified by The [employer] of Sleep Medicine and Diplomate of the American Academy of Sleep Medicine. Board certified In Neurology through the ABPN, Fellow of the [employer] of Neurology.

## 2024-02-03 NOTE — Patient Instructions (Signed)
 Rv in year or prn earlier.   Keep a Headache Journal, please .   Migraine Headache A migraine headache is a very strong throbbing pain on one or both sides of your head. This type of headache can also cause other symptoms. It can last from 4 hours to 3 days. Talk with your doctor about what things may bring on (trigger) this condition. What are the causes? The exact cause of a migraine is not known. This condition may be brought on or caused by: Smoking. Medicines, such as: Medicine used to treat chest pain (nitroglycerin). Birth control pills. Estrogen. Some blood pressure medicines. Certain substances in some foods or drinks. Foods and drinks, such as: Cheese. Chocolate. Alcohol. Caffeine. Doing physical activity that is very hard. Other things that may trigger a migraine headache include: Periods. Pregnancy. Hunger. Stress. Getting too much or too little sleep. Weather changes. Feeling tired (fatigue). What increases the risk? Being 24-58 years old. Being female. Having a family history of migraine headaches. Being Caucasian. Having a mental health condition, such as being sad (depressed) or feeling worried or nervous (anxious). Being very overweight (obese). What are the signs or symptoms? A throbbing pain. This pain may: Happen in any area of the head, such as on one or both sides. Make it hard to do daily activities. Get worse with physical activity. Get worse around bright lights, loud noises, or smells. Other symptoms may include: Feeling like you may vomit (nauseous). Vomiting. Dizziness. Before a migraine headache starts, you may get warning signs (an aura). An aura may include: Seeing flashing lights or having blind spots. Seeing bright spots, halos, or zigzag lines. Having tunnel vision or blurred vision. Having numbness or a tingling feeling. Having trouble talking. Having weak muscles. After a migraine ends, you may have symptoms. These may  include: Tiredness. Trouble thinking (concentrating). How is this treated? Taking medicines that: Relieve pain. Relieve the feeling like you may vomit. Prevent migraine headaches. Treatment may also include: Acupuncture. Lifestyle changes like avoiding foods that bring on migraine headaches. Learning ways to control your body functions (biofeedback). Therapy to help you know and deal with negative thoughts (cognitive behavioral therapy). Follow these instructions at home: Medicines Take over-the-counter and prescription medicines only as told by your doctor. If told, take steps to prevent problems with pooping (constipation). You may need to: Drink enough fluid to keep your pee (urine) pale yellow. Take medicines. You will be told what medicines to take. Eat foods that are high in fiber. These include beans, whole grains, and fresh fruits and vegetables. Limit foods that are high in fat and sugar. These include fried or sweet foods. Ask your doctor if you should avoid driving or using machines while you are taking your medicine. Lifestyle  Do not drink alcohol. Do not smoke or use any products that contain nicotine or tobacco. If you need help quitting, ask your doctor. Get 7-9 hours of sleep each night, or the amount recommended by your doctor. Find ways to deal with stress, such as meditation, deep breathing, or yoga. Try to exercise often. This can help lessen how bad and how often your migraines happen. General instructions Keep a journal to find out what may bring on your migraine headaches. This can help you avoid those things. For example, write down: What you eat and drink. How much sleep you get. Any change to your medicines or diet. If you have a migraine headache: Avoid things that make your symptoms worse, such as bright  lights. Lie down in a dark, quiet room. Do not drive or use machinery. Ask your doctor what activities are safe for you. Where to find more  information Coalition for Headache and Migraine Patients (CHAMP): headachemigraine.org American Migraine Foundation: americanmigrainefoundation.org National Headache Foundation: headaches.org Contact a doctor if: You get a migraine headache that is different or worse than others you have had. You have more than 15 days of headaches in one month. Get help right away if: Your migraine headache gets very bad. Your migraine headache lasts more than 72 hours. You have a fever or stiff neck. You have trouble seeing. Your muscles feel weak or like you cannot control them. You lose your balance a lot. You have trouble walking. You faint. You have a seizure. This information is not intended to replace advice given to you by your health care provider. Make sure you discuss any questions you have with your health care provider. Document Revised: 12/10/2021 Document Reviewed: 12/10/2021 Elsevier Patient Education  2024 [employer].

## 2024-02-03 NOTE — Progress Notes (Signed)
 Time: 11:00 am-11:58 am CPT Code: 09162E Diagnosis: F84.0  Jill Alexander was seen in person for therapy. Session included processing a conflict that had arisen with her mother, and Jill Alexander's sense of herself as overly sensitive. 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 She is scheduled to be seen again in one week.   Coping Plan 10/09/2023 Watch something funny on TV Listen to a podcast Pet the kitties  Reading some fanfiction Eat an apple (or banana) Eat a piece of chocolate Go for a walk Ask for a hug Read a book or listen to an [employer] a friend (talk to your parents, siblings, a friend) Tell your parents Go to the emergency room  12/03/2023  Therapist contacted Jill Alexander via secure message on 8/5 to check in as to her timeline for IOP and let her know that her next appointment is scheduled for 8/26. Jill Alexander called the office to ask that the therapist complete a form for her. She was notified that the therapist will be out of the office from 8/9-8/17, and to drop off the form by EOD 8/6 if she would like it completed before therapist leaves. Therapist followed up via phone on 8/6, and Jill Alexander let her know that it was ok to complete the form during her next session on 8/26. She had graduated from IOP on 8/6, and assured the therapist that she feels ok to wait until 8/26 for her next appointment, and will reach out if this changes.  Intake Presenting Problem Jill Alexander presented seeking CBT to help her manage difficulties with emotion regulation and reframing negative thought patterns that contribute to anxiety and worry. Jill Alexander has a diagnosis of Autism Spectrum Disorder (ASD). Symptoms feeling nervous or on edge, difficulty controlling worry, worrying about a variety of things, difficulty relaxing, irritability, fears for the future, feelings of hopelessness, difficulty sleeping,  feeling tired, appetite irregularities, poor self-image, passive suicidal ideation without plan or intent (client contracted for safety during initial session) History of Problem  Jill Alexander reported that she moved from [place]  with her family in July of 2018. Since that time, she has been seen by a provider in the area for challenges with emotion regulation and anxiety related to her diagnosis of autism spectrum disorder. Jill Alexander lives with her parents. She has a 30 year-old sister who has struggled with similar issues, as well as a brother who is 30 years older than she is and lives in [place]. Jill Alexander described a long history of therapy and therapeutic programs in order to help her manage these issues, including a partial hospitalization program in December of 2015.  Recent Trigger  Jill Alexander reported that she was seeking a change in pace with regard to her therapeutic treatment in order to ensure continued progress.  Marital and Family Information  Present family concerns/problems: Jill Alexander shared several accounts of disputes with her mother, including her mother becoming upset with her for sharing worries with her immediately after her mother has woken up, despite her mother's requests that she not do so.  Strengths/resources in the family/friends: Jill Alexander described overall supportive relationships with her parents.  Marital/sexual history patterns: N/A  Family of Origin  Problems in family of origin: None reported.  Family background / ethnic factors: none reported.  No needs/concerns related to ethnicity reported when asked: No  Education/Vocation  Interpersonal concerns/problems: Jill Alexander reported that she has difficulties with social interactions related  to her ASD diagnosis, and often perseverates about perceived missteps in both the immediate and distant past.  Personal strengths: Jill Alexander presented as motivated to improve her situation, and with some insight as to what is effective for her.  Military/work  problems/concerns: None noted.  Leisure Activities/Daily Functioning  impaired functioning  Legal Status  No Legal Problems  Medical/Nutritional Concerns  unspecified  Comments: Jill Alexander reported that she often perseverates on perceived medical problems, and described herself as a hypochondriac  Religion/Spirituality  Not reported  Other  General Behavior: cooperative  Attire: appropriate  Gait: normal  Motor Activity: normal  Stream of Thought - Productivity: spontaneous  Stream of thought - Progression: normal  Stream of thought - Language: normal  Emotional tone and reactions - Affect: appropriate  Mental trend/Content of thoughts - Perception: normal  Mental trend/Content of thoughts - Orientation: normal  Mental trend/Content of thoughts - Memory: normal  Mental trend/Content of thoughts - General knowledge: consistent with education  Insight: good  Judgment: good  Intelligence: average  Diagnostic Summary  Autism Spectrum Disorder (F84.0)   [facility] Counselor/Therapist Progress Note   Patient ID: Jill Alexander, MRN: [id number],     Individualized Treatment Plan Strengths: Enjoys reading, Seeking counseling, In training program through [employer], I like humor and jokes  Supports: Her dog, her bestfriend since childhood, her parents, her sister    Goal/Needs for Treatment:  In order of importance to patient 1) Emotional Regulation/Anxiety 2) Improve Positive self talk      Client Statement of Needs: Find ways of being nicer to myself, cope w/ anxiety and worry    Treatment Level:Outpatient Counseling  Symptoms:Anxiety, Perseverative worry, Negative self talk  Client Treatment Preferences:weekly counseling,  Prefers to be called Jill Alexander    Healthcare consumer's goal for treatment:  Dr. Reita Shindler will support the patient's ability to achieve the goals identified. Cognitive Behavioral Therapy, Supportive Counseling, Mindfulness and  Relaxation Strategies and other evidenced-based practices will be used to promote progress towards healthy functioning.    Healthcare consumer will: Actively participate in therapy, working towards healthy functioning.     *Justification for Continuation/Discontinuation of Goal: R=Revised, O=Ongoing, A=Achieved, D=Discontinued   Goal 1) Jill Alexander will develop strategies to regulate her emotions and improve her positive self talk   Objective: Jill Alexander will incorporate her coping skills into her routine and use them more effectively and consistently   Likert rating baseline date 04/12/21: How Easy - 3; How Often - 50% Target Date Goal Was reviewed Status Code Progress towards goal/Likert rating  05/05/2024  05/06/2023 updated Ongoing                      Goal 2: Jill Alexander will improve communication with others, especially close family members, by working on her tendency to assume the worst.   Target Date: 05/05/2024  Interventions: Therapist will help Jill Alexander to notice and disengage from maladaptive thoughts and behaviors using CBT based strategies Jill Alexander will have opportunities to process her experiences in session Therapist will engage Jill Alexander in discussion of coping strategies and creation of a plan to use them regularly Therapist will engage Jill Alexander in regular mood checks to continue to monitor her general mental health, prioritizing this at the start of the session to ensure it is covered   Therapist will provide additional resources as appropriate   Jill Alexander participated in development of tx plan and provided verbal consent.      Jill Hedding L Zaryah Seckel, Jill Alexander  Jill Bures L Zacharius Funari, Jill Alexander

## 2024-02-08 ENCOUNTER — Other Ambulatory Visit (HOSPITAL_COMMUNITY): Payer: Self-pay

## 2024-02-09 ENCOUNTER — Other Ambulatory Visit (HOSPITAL_COMMUNITY): Payer: Self-pay

## 2024-02-09 ENCOUNTER — Other Ambulatory Visit: Payer: Self-pay

## 2024-02-09 MED ORDER — DULOXETINE HCL 60 MG PO CPEP
120.0000 mg | ORAL_CAPSULE | Freq: Every day | ORAL | 1 refills | Status: AC
  Filled 2024-02-09: qty 60, 30d supply, fill #0

## 2024-02-10 ENCOUNTER — Other Ambulatory Visit: Payer: Self-pay

## 2024-02-10 ENCOUNTER — Other Ambulatory Visit (HOSPITAL_COMMUNITY): Payer: Self-pay

## 2024-02-10 ENCOUNTER — Ambulatory Visit: Admitting: Clinical

## 2024-02-10 DIAGNOSIS — F840 Autistic disorder: Secondary | ICD-10-CM

## 2024-02-10 NOTE — Progress Notes (Signed)
 Time: 11:00 am-11:58 am CPT Code: 09162E Diagnosis: F84.0  Jill Alexander was seen in person for therapy. She reported difficulty initiating tasks, including ones she knows she will enjoy. Therapist suggested taking advantage of times she is already out of the house for appointments to schedule tasks she enjoys, and she agreed to do so, with plans to go to a coffee shop after session. She is scheduled to be seen again in one week.   Coping Plan 10/09/2023 Watch something funny on TV Listen to a podcast Pet the kitties  Reading some fanfiction Eat an apple (or banana) Eat a piece of chocolate Go for a walk Ask for a hug Read a book or listen to an [employer] a friend (talk to your parents, siblings, a friend) Tell your parents Go to the emergency room  12/03/2023  Therapist contacted Jill Alexander via secure message on 8/5 to check in as to her timeline for IOP and let her know that her next appointment is scheduled for 8/26. Jill Alexander called the office to ask that the therapist complete a form for her. She was notified that the therapist will be out of the office from 8/9-8/17, and to drop off the form by EOD 8/6 if she would like it completed before therapist leaves. Therapist followed up via phone on 8/6, and Jill Alexander let her know that it was ok to complete the form during her next session on 8/26. She had graduated from IOP on 8/6, and assured the therapist that she feels ok to wait until 8/26 for her next appointment, and will reach out if this changes.  Intake Presenting Problem Jill Alexander presented seeking CBT to help her manage difficulties with emotion regulation and reframing negative thought patterns that contribute to anxiety and worry. Jill Alexander has a diagnosis of Autism Spectrum Disorder (ASD). Symptoms feeling nervous or on edge, difficulty controlling worry, worrying about a variety of things, difficulty relaxing, irritability, fears for the future, feelings of hopelessness, difficulty sleeping, feeling  tired, appetite irregularities, poor self-image, passive suicidal ideation without plan or intent (client contracted for safety during initial session) History of Problem  Jill Alexander reported that she moved from [place]  with her family in July of 2018. Since that time, she has been seen by a provider in the area for challenges with emotion regulation and anxiety related to her diagnosis of autism spectrum disorder. Jill Alexander lives with her parents. She has a 24 year-old sister who has struggled with similar issues, as well as a brother who is three years older than she is and lives in [place]. Jill Alexander described a long history of therapy and therapeutic programs in order to help her manage these issues, including a partial hospitalization program in December of 2015.  Recent Trigger  Jill Alexander reported that she was seeking a change in pace with regard to her therapeutic treatment in order to ensure continued progress.  Marital and Family Information  Present family concerns/problems: Jill Alexander shared several accounts of disputes with her mother, including her mother becoming upset with her for sharing worries with her immediately after her mother has woken up, despite her mother's requests that she not do so.  Strengths/resources in the family/friends: Jill Alexander described overall supportive relationships with her parents.  Marital/sexual history patterns: N/A  Family of Origin  Problems in family of origin: None reported.  Family background / ethnic factors: none reported.  No needs/concerns related to ethnicity reported when asked: No  Education/Vocation  Interpersonal concerns/problems: Jill Alexander reported that she has difficulties with social interactions related  to her ASD diagnosis, and often perseverates about perceived missteps in both the immediate and distant past.  Personal strengths: Jill Alexander presented as motivated to improve her situation, and with some insight as to what is effective for her.  Military/work  problems/concerns: None noted.  Leisure Activities/Daily Functioning  impaired functioning  Legal Status  No Legal Problems  Medical/Nutritional Concerns  unspecified  Comments: Jill Alexander reported that she often perseverates on perceived medical problems, and described herself as a hypochondriac  Religion/Spirituality  Not reported  Other  General Behavior: cooperative  Attire: appropriate  Gait: normal  Motor Activity: normal  Stream of Thought - Productivity: spontaneous  Stream of thought - Progression: normal  Stream of thought - Language: normal  Emotional tone and reactions - Affect: appropriate  Mental trend/Content of thoughts - Perception: normal  Mental trend/Content of thoughts - Orientation: normal  Mental trend/Content of thoughts - Memory: normal  Mental trend/Content of thoughts - General knowledge: consistent with education  Insight: good  Judgment: good  Intelligence: average  Diagnostic Summary  Autism Spectrum Disorder (F84.0)   [facility] Counselor/Therapist Progress Note   Patient ID: Angalina Ante, MRN: [id number],     Individualized Treatment Plan Strengths: Enjoys reading, Seeking counseling, In training program through [employer], I like humor and jokes  Supports: Her dog, her bestfriend since childhood, her parents, her sister    Goal/Needs for Treatment:  In order of importance to patient 1) Emotional Regulation/Anxiety 2) Improve Positive self talk      Client Statement of Needs: Find ways of being nicer to myself, cope w/ anxiety and worry    Treatment Level:Outpatient Counseling  Symptoms:Anxiety, Perseverative worry, Negative self talk  Client Treatment Preferences:weekly counseling,  Prefers to be called Jill Alexander    Healthcare consumer's goal for treatment:  Dr. Averill Pons will support the patient's ability to achieve the goals identified. Cognitive Behavioral Therapy, Supportive Counseling, Mindfulness and  Relaxation Strategies and other evidenced-based practices will be used to promote progress towards healthy functioning.    Healthcare consumer will: Actively participate in therapy, working towards healthy functioning.     *Justification for Continuation/Discontinuation of Goal: R=Revised, O=Ongoing, A=Achieved, D=Discontinued   Goal 1) Jill Alexander will develop strategies to regulate her emotions and improve her positive self talk   Objective: Jill Alexander will incorporate her coping skills into her routine and use them more effectively and consistently   Likert rating baseline date 04/12/21: How Easy - 3; How Often - 50% Target Date Goal Was reviewed Status Code Progress towards goal/Likert rating  05/05/2024  05/06/2023 updated Ongoing                      Goal 2: Jill Alexander will improve communication with others, especially close family members, by working on her tendency to assume the worst.   Target Date: 05/05/2024  Interventions: Therapist will help Jill Alexander to notice and disengage from maladaptive thoughts and behaviors using CBT based strategies Jill Alexander will have opportunities to process her experiences in session Therapist will engage Jill Alexander in discussion of coping strategies and creation of a plan to use them regularly Therapist will engage Jill Alexander in regular mood checks to continue to monitor her general mental health, prioritizing this at the start of the session to ensure it is covered   Therapist will provide additional resources as appropriate   Jill Alexander participated in development of tx plan and provided verbal consent.     Ree Alcalde L Lashya Passe, PhD

## 2024-02-11 ENCOUNTER — Other Ambulatory Visit (HOSPITAL_COMMUNITY): Payer: Self-pay

## 2024-02-11 ENCOUNTER — Other Ambulatory Visit: Payer: Self-pay

## 2024-02-11 DIAGNOSIS — Z3041 Encounter for surveillance of contraceptive pills: Secondary | ICD-10-CM

## 2024-02-11 MED ORDER — DROSPIRENONE-ETHINYL ESTRADIOL 3-0.02 MG PO TABS
1.0000 | ORAL_TABLET | Freq: Every day | ORAL | 0 refills | Status: DC
Start: 2024-02-11 — End: 2024-03-09
  Filled 2024-02-11: qty 28, 28d supply, fill #0

## 2024-02-17 ENCOUNTER — Ambulatory Visit: Admitting: Clinical

## 2024-02-17 DIAGNOSIS — F840 Autistic disorder: Secondary | ICD-10-CM

## 2024-02-17 NOTE — Progress Notes (Signed)
 Time: 11:00 am-11:58 am CPT Code: 09162E Diagnosis: F84.0  Jill Alexander was seen in person for therapy. She reported continuing depression, buth had been successful in getting out of the house several times, including to attend an event at a bookstore and to volunteer at an event at [employer]. Therapist encouraged her to consider one pleasant event or mastery experience to try in the next week. She is scheduled to be seen again in one week.   Coping Plan 10/09/2023 Watch something funny on TV Listen to a podcast Pet the kitties  Reading some fanfiction Eat an apple (or banana) Eat a piece of chocolate Go for a walk Ask for a hug Read a book or listen to an [employer] a friend (talk to your parents, siblings, a friend) Tell your parents Go to the emergency room  12/03/2023  Therapist contacted Jill Alexander via secure message on 8/5 to check in as to her timeline for IOP and let her know that her next appointment is scheduled for 8/26. Jill Alexander called the office to ask that the therapist complete a form for her. She was notified that the therapist will be out of the office from 8/9-8/17, and to drop off the form by EOD 8/6 if she would like it completed before therapist leaves. Therapist followed up via phone on 8/6, and Jill Alexander let her know that it was ok to complete the form during her next session on 8/26. She had graduated from IOP on 8/6, and assured the therapist that she feels ok to wait until 8/26 for her next appointment, and will reach out if this changes.  Intake Presenting Problem Jill Alexander presented seeking CBT to help her manage difficulties with emotion regulation and reframing negative thought patterns that contribute to anxiety and worry. Jill Alexander has a diagnosis of Autism Spectrum Disorder (ASD). Symptoms feeling nervous or on edge, difficulty controlling worry, worrying about a variety of things, difficulty relaxing, irritability, fears for the future, feelings of hopelessness, difficulty  sleeping, feeling tired, appetite irregularities, poor self-image, passive suicidal ideation without plan or intent (client contracted for safety during initial session) History of Problem  Jill Alexander reported that she moved from [place]  with her family in July of 2018. Since that time, she has been seen by a provider in the area for challenges with emotion regulation and anxiety related to her diagnosis of autism spectrum disorder. Jill Alexander lives with her parents. She has a 74 year-old sister who has struggled with similar issues, as well as a brother who is three years older than she is and lives in [place]. Jill Alexander described a long history of therapy and therapeutic programs in order to help her manage these issues, including a partial hospitalization program in December of 2015.  Recent Trigger  Jill Alexander reported that she was seeking a change in pace with regard to her therapeutic treatment in order to ensure continued progress.  Marital and Family Information  Present family concerns/problems: Jill Alexander shared several accounts of disputes with her mother, including her mother becoming upset with her for sharing worries with her immediately after her mother has woken up, despite her mother's requests that she not do so.  Strengths/resources in the family/friends: Jill Alexander described overall supportive relationships with her parents.  Marital/sexual history patterns: N/A  Family of Origin  Problems in family of origin: None reported.  Family background / ethnic factors: none reported.  No needs/concerns related to ethnicity reported when asked: No  Education/Vocation  Interpersonal concerns/problems: Jill Alexander reported that she has difficulties with social  interactions related to her ASD diagnosis, and often perseverates about perceived missteps in both the immediate and distant past.  Personal strengths: Jill Alexander presented as motivated to improve her situation, and with some insight as to what is effective for her.   Military/work problems/concerns: None noted.  Leisure Activities/Daily Functioning  impaired functioning  Legal Status  No Legal Problems  Medical/Nutritional Concerns  unspecified  Comments: Jill Alexander reported that she often perseverates on perceived medical problems, and described herself as a hypochondriac  Religion/Spirituality  Not reported  Other  General Behavior: cooperative  Attire: appropriate  Gait: normal  Motor Activity: normal  Stream of Thought - Productivity: spontaneous  Stream of thought - Progression: normal  Stream of thought - Language: normal  Emotional tone and reactions - Affect: appropriate  Mental trend/Content of thoughts - Perception: normal  Mental trend/Content of thoughts - Orientation: normal  Mental trend/Content of thoughts - Memory: normal  Mental trend/Content of thoughts - General knowledge: consistent with education  Insight: good  Judgment: good  Intelligence: average  Diagnostic Summary  Autism Spectrum Disorder (F84.0)   [facility] Counselor/Therapist Progress Note   Patient ID: Miller Edgington, MRN: [id number],     Individualized Treatment Plan Strengths: Enjoys reading, Seeking counseling, In training program through [employer], I like humor and jokes  Supports: Her dog, her bestfriend since childhood, her parents, her sister    Goal/Needs for Treatment:  In order of importance to patient 1) Emotional Regulation/Anxiety 2) Improve Positive self talk      Client Statement of Needs: Find ways of being nicer to myself, cope w/ anxiety and worry    Treatment Level:Outpatient Counseling  Symptoms:Anxiety, Perseverative worry, Negative self talk  Client Treatment Preferences:weekly counseling,  Prefers to be called Jill Alexander    Healthcare consumer's goal for treatment:  Dr. Marcheta Horsey will support the patient's ability to achieve the goals identified. Cognitive Behavioral Therapy, Supportive Counseling,  Mindfulness and Relaxation Strategies and other evidenced-based practices will be used to promote progress towards healthy functioning.    Healthcare consumer will: Actively participate in therapy, working towards healthy functioning.     *Justification for Continuation/Discontinuation of Goal: R=Revised, O=Ongoing, A=Achieved, D=Discontinued   Goal 1) Jill Alexander will develop strategies to regulate her emotions and improve her positive self talk   Objective: Jill Alexander will incorporate her coping skills into her routine and use them more effectively and consistently   Likert rating baseline date 04/12/21: How Easy - 3; How Often - 50% Target Date Goal Was reviewed Status Code Progress towards goal/Likert rating  05/05/2024  05/06/2023 updated Ongoing                      Goal 2: Jill Alexander will improve communication with others, especially close family members, by working on her tendency to assume the worst.   Target Date: 05/05/2024  Interventions: Therapist will help Jill Alexander to notice and disengage from maladaptive thoughts and behaviors using CBT based strategies Jill Alexander will have opportunities to process her experiences in session Therapist will engage Jill Alexander in discussion of coping strategies and creation of a plan to use them regularly Therapist will engage Jill Alexander in regular mood checks to continue to monitor her general mental health, prioritizing this at the start of the session to ensure it is covered   Therapist will provide additional resources as appropriate   Jill Alexander participated in development of tx plan and provided verbal consent.     Aleida Crandell L Briar Witherspoon, PhD  Jamisen Duerson L Ahamed Hofland, PhD

## 2024-02-18 ENCOUNTER — Encounter: Payer: Self-pay | Admitting: Gastroenterology

## 2024-02-18 ENCOUNTER — Ambulatory Visit: Admitting: Gastroenterology

## 2024-02-18 VITALS — BP 90/60 | HR 79 | Ht 62.0 in | Wt 129.4 lb

## 2024-02-18 DIAGNOSIS — K219 Gastro-esophageal reflux disease without esophagitis: Secondary | ICD-10-CM | POA: Diagnosis not present

## 2024-02-18 DIAGNOSIS — R1013 Epigastric pain: Secondary | ICD-10-CM | POA: Diagnosis not present

## 2024-02-18 DIAGNOSIS — Z79899 Other long term (current) drug therapy: Secondary | ICD-10-CM

## 2024-02-18 MED ORDER — PANTOPRAZOLE SODIUM 40 MG PO TBEC: 40.0000 mg | DELAYED_RELEASE_TABLET | Freq: Two times a day (BID) | ORAL | 1 refills | Status: DC

## 2024-02-18 MED ORDER — FDGARD 25-20.75 MG PO CAPS
ORAL_CAPSULE | ORAL | 0 refills | Status: AC
Start: 2024-02-18 — End: ?

## 2024-02-18 NOTE — Patient Instructions (Addendum)
 Increase Protonix  to twice a day.  We have given you samples of the following medication to take: FDgard - take as directed as needed  Thank you for entrusting me with your care and for choosing [facility], Dr. Elspeth Naval   _______________________________________________________  If your blood pressure at your visit was 140/90 or greater, please contact your primary care physician to follow up on this.  _______________________________________________________  If you are age 33 or older, your body mass index should be between 23-30. Your Body mass index is 23.67 kg/m. If this is out of the aforementioned range listed, please consider follow up with your Primary Care Provider.  If you are age 72 or younger, your body mass index should be between 19-25. Your Body mass index is 23.67 kg/m. If this is out of the aformentioned range listed, please consider follow up with your Primary Care Provider.   ________________________________________________________  The [facility] providers would like to encourage you to use MYCHART to communicate with providers for non-urgent requests or questions.  Due to long hold times on the telephone, sending your provider a message by [facility] may be a faster and more efficient way to get a response.  Please allow 48 business hours for a response.  Please remember that this is for non-urgent requests.  _______________________________________________________  [facility] is using a team-based approach to care.  Your team is made up of your doctor and two to three APPS. Our APPS (Nurse Practitioners and Physician Assistants) work with your physician to ensure care continuity for you. They are fully qualified to address your health concerns and develop a treatment plan. They communicate directly with your gastroenterologist to care for you. Seeing the Advanced Practice Practitioners on your physician's team can help you by facilitating care  more promptly, often allowing for earlier appointments, access to diagnostic testing, procedures, and other specialty referrals.

## 2024-02-18 NOTE — Progress Notes (Signed)
 HPI :  28 year old female here for a follow-up visit for GERD, chronic nausea, dyspepsia.  Recall I have seen her over the past several years for these issues.  She has had an extensive evaluation with EGD, negative celiac serologies, negative H. pylori testing.  We had discussed doing GES in the past however she declined in favor of trial of empiric Reglan  which did not help her as much.  Recall she has been tried on omeprazole  historically and more recently pantoprazole  for the past year.  She has been on Protonix  40 mg once daily.  I had also given her some FD guard after the last visit although she cannot recall if she took it or not, and if so if it even helped.  She has symptoms of reflux that bother her more than anything.  She has pyrosis most days of the week that bother her.  Can be in the morning when she wakes up, can also be at night after she eats, or in the afternoon after eating.  She has occasional regurgitation with this.  She is able to eat okay but does find some clear trigger foods that make her symptoms worse such as tomato-based, spicy foods, beans, [place] foods in general.  She does have some bloating in her abdomen after eating some of these foods as well which can also make things worse.  Again she has tried Reglan  years ago and did not think it helped too much, we did not pursue GES at the time.  She does not have much abdominal pain, more so bloating and reflux.  She did have a right upper quadrant ultrasound after our last visit which showed no gallstones.    EGD 04/20/2018 - normal esophagus, biopsies taken - no evidence of EoE, normal stomach - biopsies show no H pylori, normal duodenum - biopsies show no celiac  RUQ US  02/21/23: IMPRESSION: No cholelithiasis or sonographic evidence for acute cholecystitis.    Past Medical History:  Diagnosis Date   Anxiety    Autism    Chronic nausea    Common migraine with intractable migraine 07/01/2019   Depression     Dyspepsia    GERD (gastroesophageal reflux disease)    Headache    Heart murmur    Seizures (HCC)    Tremor, 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     Past Surgical History:  Procedure Laterality Date   none     Family History  Problem Relation Age of Onset   Depression Father    Depression Sister    Anxiety disorder Sister    Diabetes Maternal Grandfather    Cancer Maternal Grandfather    Breast cancer Paternal Grandmother    Uterine cancer Paternal Grandmother    Irritable bowel syndrome Paternal Grandmother    Colon cancer Neg Hx    Esophageal cancer Neg Hx    Stomach cancer Neg Hx    Rectal cancer Neg Hx    Social History   Tobacco Use   Smoking status: Never   Smokeless tobacco: Never  Vaping Use   Vaping status: Never Used  Substance Use Topics   Alcohol use: Yes    Comment: 1 cocktail a few times a year   Drug use: No   Current Outpatient Medications  Medication Sig Dispense Refill   acetaminophen (TYLENOL) 500 MG tablet Take 500 mg by mouth every 6 (six) hours as needed.     Docusate Sodium (COLACE PO) Take by mouth.     drospirenone -ethinyl estradiol  (YAZ)  3-0.02 MG tablet Take 1 tablet by mouth daily. 28 tablet 0   DULoxetine  (CYMBALTA ) 60 MG capsule Take 2 capsules (120 mg total) by mouth daily. 60 capsule 1   Famotidine (PEPCID PO) Take by mouth.     Fremanezumab -vfrm (AJOVY ) 225 MG/1.5ML SOAJ Inject 225 mg into the skin every 30 (thirty) days. 1.5 mL 11   guanFACINE  (INTUNIV ) 2 MG TB24 ER tablet Take 1 tablet (2 mg total) by mouth at bedtime for sleep. 90 tablet 1   Ibuprofen (MOTRIN PO) Take 200 mg by mouth as needed.     lamoTRIgine  (LAMICTAL ) 200 MG tablet Take 2 tablets (400 mg total) by mouth daily. 180 tablet 1   LORazepam  (ATIVAN ) 1 MG tablet Take 1 tablet (1 mg total) by mouth 2 (two) times daily as needed for severe anxiety. 60 tablet 1   Lurasidone  HCl 60 MG TABS Take 1 tablet (60 mg total) by mouth every evening with food. 90 tablet 1   melatonin 1 MG TABS  tablet Take 1 tablet (1 mg total) by mouth at bedtime as needed (insomnia). 30 tablet 0   mirabegron  ER (MYRBETRIQ ) 50 MG TB24 tablet Take 1 tablet (50 mg total) by mouth daily. 30 tablet 3   Multiple Vitamins-Minerals (MULTIVITAMIN PO) Take 1 tablet by mouth daily.     pantoprazole  (PROTONIX ) 40 MG tablet Take 1 tablet (40 mg total) by mouth daily. Please schedule a yearly follow up for further refills. Thank you 30 tablet 2   polyethylene glycol (MIRALAX ) packet Take 17 g by mouth daily as needed. 14 each 0   rizatriptan  (MAXALT ) 10 MG tablet Take 1 tablet (10 mg total) by mouth as needed for migraine. May repeat in 2 hours if needed. 10 tablet 11   topiramate  (TOPAMAX ) 50 MG tablet Take 1 tablet (50 mg total) by mouth daily. 90 tablet 3   No current facility-administered medications for this visit.   Allergies  Allergen Reactions   Gluten Meal      Review of Systems: All systems reviewed and negative except where noted in HPI.   Lab Results  Component Value Date   WBC 6.1 09/25/2021   HGB 13.8 09/25/2021   HCT 40.5 09/25/2021   MCV 92 09/25/2021   PLT 264 09/25/2021    Lab Results  Component Value Date   NA 139 09/25/2021   CL 107 (H) 09/25/2021   K 4.1 09/25/2021   CO2 19 (L) 09/25/2021   BUN 17 09/25/2021   CREATININE 0.85 09/25/2021   EGFR 96 09/25/2021   CALCIUM 9.3 09/25/2021   ALBUMIN 4.4 09/25/2021   GLUCOSE 76 09/25/2021     Physical Exam: BP 90/60   Pulse 79   Ht 5' 2 (1.575 m)   Wt 129 lb 6.4 oz (58.7 kg)   BMI 23.67 kg/m  Constitutional: Pleasant,well-developed, female in no acute distress. Neurological: Alert and oriented to person place and time. Psychiatric: Normal mood and affect. Behavior is normal.   ASSESSMENT: 29 y.o. female here for assessment of the following  1. Gastroesophageal reflux disease, unspecified whether esophagitis present   2. Long-term current use of proton pump inhibitor therapy   3. Dyspepsia    Chronic  GERD/dyspepsia, has had some improvement with PPI over years however she still has fairly frequent symptoms that bother her.  Negative for celiac disease, negative for gallstones.  Her symptoms that are most bothersome appear to be pyrosis at this time.  Discussed options.  While we want to  use the lowest dose of PPI to control her symptoms, I think she may benefit from higher dosing.  Will try Protonix  40 mg twice daily for a 4-week trial, take 30 to 60 minutes prior to meals.  If she finds higher dosing helps her more than she can continue that.  We did discuss long-term risks of chronic PPIs, she understands this, however if this works to control symptoms I feel benefits outweigh risks.  Otherwise for component of dyspepsia she cannot recall if FDgard helped her but she thinks she took it.  I will give her more samples for this today and I asked her to be mindful when she takes it and if it helps.  If it helps her we can take it as routinely as she like.  If her symptoms persist and are not any better despite these measures, consider gastric emptying study and/or 24-hour pH impedance testing to further evaluate.  She should see me at least annually for these symptoms  PLAN: - increase protonix  to BID dosing for 1 month trial - counseled on long term us  of PPI, weigh risks / benefits - add FD gard samples to use PRN - GES and or 24 HR pH impedance test if no improvement  Marcey Naval, MD [facility]

## 2024-02-19 ENCOUNTER — Other Ambulatory Visit (HOSPITAL_COMMUNITY): Payer: Self-pay

## 2024-02-23 ENCOUNTER — Other Ambulatory Visit (HOSPITAL_COMMUNITY): Payer: Self-pay

## 2024-02-23 ENCOUNTER — Other Ambulatory Visit: Payer: Self-pay | Admitting: Gastroenterology

## 2024-02-23 ENCOUNTER — Other Ambulatory Visit: Payer: Self-pay

## 2024-02-23 ENCOUNTER — Encounter (HOSPITAL_COMMUNITY): Payer: Self-pay

## 2024-02-23 ENCOUNTER — Telehealth: Payer: Self-pay | Admitting: Gastroenterology

## 2024-02-23 DIAGNOSIS — F411 Generalized anxiety disorder: Secondary | ICD-10-CM | POA: Diagnosis not present

## 2024-02-23 DIAGNOSIS — F331 Major depressive disorder, recurrent, moderate: Secondary | ICD-10-CM | POA: Diagnosis not present

## 2024-02-23 DIAGNOSIS — F900 Attention-deficit hyperactivity disorder, predominantly inattentive type: Secondary | ICD-10-CM | POA: Diagnosis not present

## 2024-02-23 DIAGNOSIS — F840 Autistic disorder: Secondary | ICD-10-CM | POA: Diagnosis not present

## 2024-02-23 MED ORDER — LURASIDONE HCL 60 MG PO TABS
60.0000 mg | ORAL_TABLET | Freq: Every evening | ORAL | 1 refills | Status: AC
  Filled 2024-02-23 – 2024-04-25 (×2): qty 90, 90d supply, fill #0
  Filled 2024-05-21 – 2024-05-31 (×2): qty 90, 90d supply, fill #1
  Filled 2024-05-31: qty 90, 90d supply, fill #0

## 2024-02-23 MED ORDER — LAMOTRIGINE 200 MG PO TABS
400.0000 mg | ORAL_TABLET | Freq: Every day | ORAL | 1 refills | Status: DC
  Filled 2024-02-23: qty 180, 90d supply, fill #0
  Filled 2024-05-21: qty 60, 30d supply, fill #1
  Filled 2024-05-21: qty 180, 90d supply, fill #1
  Filled 2024-05-21: qty 120, 60d supply, fill #1

## 2024-02-23 MED ORDER — DULOXETINE HCL 60 MG PO CPEP
120.0000 mg | ORAL_CAPSULE | Freq: Every day | ORAL | 1 refills | Status: AC
  Filled 2024-03-14: qty 180, 90d supply, fill #0
  Filled 2024-05-21: qty 180, 90d supply, fill #1
  Filled 2024-05-31: qty 180, 90d supply, fill #0
  Filled 2024-05-31: qty 180, 90d supply, fill #1

## 2024-02-23 NOTE — Telephone Encounter (Signed)
 PT needs to have a refill for pantoprazole . The pharmacist stated that the medication was denied. Patient was just recently seen 10/22. Please advise.

## 2024-02-24 ENCOUNTER — Ambulatory Visit: Admitting: Clinical

## 2024-02-24 ENCOUNTER — Other Ambulatory Visit: Payer: Self-pay

## 2024-02-24 ENCOUNTER — Other Ambulatory Visit (HOSPITAL_COMMUNITY): Payer: Self-pay

## 2024-02-24 DIAGNOSIS — F840 Autistic disorder: Secondary | ICD-10-CM | POA: Diagnosis not present

## 2024-02-24 MED ORDER — PANTOPRAZOLE SODIUM 40 MG PO TBEC
40.0000 mg | DELAYED_RELEASE_TABLET | Freq: Two times a day (BID) | ORAL | 1 refills | Status: DC
  Filled 2024-02-24: qty 60, 30d supply, fill #0
  Filled 2024-04-04: qty 60, 30d supply, fill #1

## 2024-02-24 NOTE — Telephone Encounter (Signed)
 Refill of pantoprazole  40 mg BID sent to pharmacy

## 2024-02-24 NOTE — Progress Notes (Signed)
 Time: 11:00 am-11:58 am CPT Code: 09162E Diagnosis: F84.0  Jill Alexander was seen in person for therapy. She had reached out to the therapist via email reporting feelings of self-hatred. During session, she attributed the flare up in poor self image to an incident during which her mother had become frustrated by Jill Alexander's inaction when one of her hearing aids stopped working. Therapist encouraged her to focus on actions, rather than personal attributes. Therapist also encouraged her to consider how she can act upon her values, rather than seeking external validation from others. She reported a success during a visit to her brother in [place]. She is scheduled to be seen again in one week.   Coping Plan 10/09/2023 Watch something funny on TV Listen to a podcast Pet the kitties  Reading some fanfiction Eat an apple (or banana) Eat a piece of chocolate Go for a walk Ask for a hug Read a book or listen to an [employer] a friend (talk to your parents, siblings, a friend) Tell your parents Go to the emergency room    Intake Presenting Problem Jill Alexander presented seeking CBT to help her manage difficulties with emotion regulation and reframing negative thought patterns that contribute to anxiety and worry. Jill Alexander has a diagnosis of Autism Spectrum Disorder (ASD). Symptoms feeling nervous or on edge, difficulty controlling worry, worrying about a variety of things, difficulty relaxing, irritability, fears for the future, feelings of hopelessness, difficulty sleeping, feeling tired, appetite irregularities, poor self-image, passive suicidal ideation without plan or intent (client contracted for safety during initial session) History of Problem  Jill Alexander reported that she moved from [place]  with her family in July of 2018. Since that time, she has been seen by a provider in the area for challenges with emotion regulation and anxiety related to her diagnosis of autism spectrum disorder. Jill Alexander lives with her  parents. She has a 30 year-old sister who has struggled with similar issues, as well as a brother who is three years older than she is and lives in [place]. Jill Alexander described a long history of therapy and therapeutic programs in order to help her manage these issues, including a partial hospitalization program in December of 2015.  Recent Trigger  Jill Alexander reported that she was seeking a change in pace with regard to her therapeutic treatment in order to ensure continued progress.  Marital and Family Information  Present family concerns/problems: Jill Alexander shared several accounts of disputes with her mother, including her mother becoming upset with her for sharing worries with her immediately after her mother has woken up, despite her mother's requests that she not do so.  Strengths/resources in the family/friends: Jill Alexander described overall supportive relationships with her parents.  Marital/sexual history patterns: N/A  Family of Origin  Problems in family of origin: None reported.  Family background / ethnic factors: none reported.  No needs/concerns related to ethnicity reported when asked: No  Education/Vocation  Interpersonal concerns/problems: Jill Alexander reported that she has difficulties with social interactions related to her ASD diagnosis, and often perseverates about perceived missteps in both the immediate and distant past.  Personal strengths: Jill Alexander presented as motivated to improve her situation, and with some insight as to what is effective for her.  Military/work problems/concerns: None noted.  Leisure Activities/Daily Functioning  impaired functioning  Legal Status  No Legal Problems  Medical/Nutritional Concerns  unspecified  Comments: Jill Alexander reported that she often perseverates on perceived medical problems, and described herself as a hypochondriac  Religion/Spirituality  Not reported  Other  General Behavior: cooperative  Attire: appropriate  Gait: normal  Motor Activity: normal   Stream of Thought - Productivity: spontaneous  Stream of thought - Progression: normal  Stream of thought - Language: normal  Emotional tone and reactions - Affect: appropriate  Mental trend/Content of thoughts - Perception: normal  Mental trend/Content of thoughts - Orientation: normal  Mental trend/Content of thoughts - Memory: normal  Mental trend/Content of thoughts - General knowledge: consistent with education  Insight: good  Judgment: good  Intelligence: average  Diagnostic Summary  Autism Spectrum Disorder (F84.0)   [facility] Counselor/Therapist Progress Note   Patient ID: Jill Alexander, MRN: [id number],     Individualized Treatment Plan Strengths: Enjoys reading, Seeking counseling, In training program through [employer], I like humor and jokes  Supports: Her dog, her bestfriend since childhood, her parents, her sister    Goal/Needs for Treatment:  In order of importance to patient 1) Emotional Regulation/Anxiety 2) Improve Positive self talk      Client Statement of Needs: Find ways of being nicer to myself, cope w/ anxiety and worry    Treatment Level:Outpatient Counseling  Symptoms:Anxiety, Perseverative worry, Negative self talk  Client Treatment Preferences:weekly counseling,  Prefers to be called Jill Alexander    Healthcare consumer's goal for treatment:  Dr. Madeliene Tejera will support the patient's ability to achieve the goals identified. Cognitive Behavioral Therapy, Supportive Counseling, Mindfulness and Relaxation Strategies and other evidenced-based practices will be used to promote progress towards healthy functioning.    Healthcare consumer will: Actively participate in therapy, working towards healthy functioning.     *Justification for Continuation/Discontinuation of Goal: R=Revised, O=Ongoing, A=Achieved, D=Discontinued   Goal 1) Jill Alexander will develop strategies to regulate her emotions and improve her positive self  talk   Objective: Jill Alexander will incorporate her coping skills into her routine and use them more effectively and consistently   Likert rating baseline date 04/12/21: How Easy - 3; How Often - 50% Target Date Goal Was reviewed Status Code Progress towards goal/Likert rating  05/05/2024  05/06/2023 updated Ongoing                      Goal 2: Jill Alexander will improve communication with others, especially close family members, by working on her tendency to assume the worst.   Target Date: 05/05/2024  Interventions: Therapist will help Jill Alexander to notice and disengage from maladaptive thoughts and behaviors using CBT based strategies Jill Alexander will have opportunities to process her experiences in session Therapist will engage Jill Alexander in discussion of coping strategies and creation of a plan to use them regularly Therapist will engage Jill Alexander in regular mood checks to continue to monitor her general mental health, prioritizing this at the start of the session to ensure it is covered   Therapist will provide additional resources as appropriate   Jill Alexander participated in development of tx plan and provided verbal consent.       Iesha Summerhill L Crystalle Popwell, PhD               Andriette LITTIE Ponto, PhD

## 2024-02-25 ENCOUNTER — Other Ambulatory Visit (HOSPITAL_COMMUNITY): Payer: Self-pay

## 2024-02-26 ENCOUNTER — Other Ambulatory Visit (HOSPITAL_COMMUNITY): Payer: Self-pay

## 2024-02-26 DIAGNOSIS — K219 Gastro-esophageal reflux disease without esophagitis: Secondary | ICD-10-CM | POA: Diagnosis not present

## 2024-02-26 DIAGNOSIS — F410 Panic disorder [episodic paroxysmal anxiety] without agoraphobia: Secondary | ICD-10-CM | POA: Diagnosis not present

## 2024-02-26 DIAGNOSIS — Z8669 Personal history of other diseases of the nervous system and sense organs: Secondary | ICD-10-CM | POA: Diagnosis not present

## 2024-02-26 DIAGNOSIS — F840 Autistic disorder: Secondary | ICD-10-CM | POA: Diagnosis not present

## 2024-02-26 DIAGNOSIS — G43019 Migraine without aura, intractable, without status migrainosus: Secondary | ICD-10-CM | POA: Diagnosis not present

## 2024-02-26 DIAGNOSIS — F3181 Bipolar II disorder: Secondary | ICD-10-CM | POA: Diagnosis not present

## 2024-02-26 DIAGNOSIS — Z13220 Encounter for screening for lipoid disorders: Secondary | ICD-10-CM | POA: Diagnosis not present

## 2024-02-26 DIAGNOSIS — F418 Other specified anxiety disorders: Secondary | ICD-10-CM | POA: Diagnosis not present

## 2024-02-26 DIAGNOSIS — Z23 Encounter for immunization: Secondary | ICD-10-CM | POA: Diagnosis not present

## 2024-02-26 DIAGNOSIS — Z0000 Encounter for general adult medical examination without abnormal findings: Secondary | ICD-10-CM | POA: Diagnosis not present

## 2024-03-01 ENCOUNTER — Other Ambulatory Visit (HOSPITAL_COMMUNITY): Payer: Self-pay

## 2024-03-01 MED ORDER — AUVELITY 45-105 MG PO TBCR
1.0000 | EXTENDED_RELEASE_TABLET | Freq: Two times a day (BID) | ORAL | 2 refills | Status: DC
  Filled 2024-03-01: qty 60, 30d supply, fill #0
  Filled 2024-03-14 – 2024-04-04 (×2): qty 60, 30d supply, fill #1
  Filled 2024-04-25 – 2024-04-28 (×2): qty 60, 30d supply, fill #2

## 2024-03-02 ENCOUNTER — Ambulatory Visit: Admitting: Clinical

## 2024-03-02 ENCOUNTER — Ambulatory Visit: Admitting: Obstetrics and Gynecology

## 2024-03-02 DIAGNOSIS — F840 Autistic disorder: Secondary | ICD-10-CM

## 2024-03-02 NOTE — Progress Notes (Signed)
 Time: 11:00 am-11:58 am CPT Code: 09162E Diagnosis: F84.0  Jill Alexander was seen in person for therapy. She demonstrated a continued depressed mood, and became tearful during session when the examiner asked her to picture what she might want for herself. She expressed that she wanted to use session time to vent. She was able to list a success. Suicidal ideation without plan or intent was endorsed and Jill Alexander indicated that she would be safe. She is scheduled to be seen again in one week.   Coping Plan 10/09/2023 Watch something funny on TV Listen to a podcast Pet the kitties  Reading some fanfiction Eat an apple (or banana) Eat a piece of chocolate Go for a walk Ask for a hug Read a book or listen to an [employer] a friend (talk to your parents, siblings, a friend) Tell your parents Go to the emergency room    Intake Presenting Problem Jill Alexander presented seeking CBT to help her manage difficulties with emotion regulation and reframing negative thought patterns that contribute to anxiety and worry. Jill Alexander has a diagnosis of Autism Spectrum Disorder (ASD). Symptoms feeling nervous or on edge, difficulty controlling worry, worrying about a variety of things, difficulty relaxing, irritability, fears for the future, feelings of hopelessness, difficulty sleeping, feeling tired, appetite irregularities, poor self-image, passive suicidal ideation without plan or intent (client contracted for safety during initial session) History of Problem  Jill Alexander reported that she moved from [place]  with her family in July of 2018. Since that time, she has been seen by a provider in the area for challenges with emotion regulation and anxiety related to her diagnosis of autism spectrum disorder. Jill Alexander lives with her parents. She has a 12 year-old sister who has struggled with similar issues, as well as a brother who is three years older than she is and lives in [place]. Jill Alexander described a long history of therapy and  therapeutic programs in order to help her manage these issues, including a partial hospitalization program in December of 2015.  Recent Trigger  Jill Alexander reported that she was seeking a change in pace with regard to her therapeutic treatment in order to ensure continued progress.  Marital and Family Information  Present family concerns/problems: Jill Alexander shared several accounts of disputes with her mother, including her mother becoming upset with her for sharing worries with her immediately after her mother has woken up, despite her mother's requests that she not do so.  Strengths/resources in the family/friends: Jill Alexander described overall supportive relationships with her parents.  Marital/sexual history patterns: N/A  Family of Origin  Problems in family of origin: None reported.  Family background / ethnic factors: none reported.  No needs/concerns related to ethnicity reported when asked: No  Education/Vocation  Interpersonal concerns/problems: Jill Alexander reported that she has difficulties with social interactions related to her ASD diagnosis, and often perseverates about perceived missteps in both the immediate and distant past.  Personal strengths: Jill Alexander presented as motivated to improve her situation, and with some insight as to what is effective for her.  Military/work problems/concerns: None noted.  Leisure Activities/Daily Functioning  impaired functioning  Legal Status  No Legal Problems  Medical/Nutritional Concerns  unspecified  Comments: Jill Alexander reported that she often perseverates on perceived medical problems, and described herself as a hypochondriac  Religion/Spirituality  Not reported  Other  General Behavior: cooperative  Attire: appropriate  Gait: normal  Motor Activity: normal  Stream of Thought - Productivity: spontaneous  Stream of thought - Progression: normal  Stream of thought - Language:  normal  Emotional tone and reactions - Affect: appropriate  Mental trend/Content of  thoughts - Perception: normal  Mental trend/Content of thoughts - Orientation: normal  Mental trend/Content of thoughts - Memory: normal  Mental trend/Content of thoughts - General knowledge: consistent with education  Insight: good  Judgment: good  Intelligence: average  Diagnostic Summary  Autism Spectrum Disorder (F84.0)   [facility] Counselor/Therapist Progress Note   Patient ID: Kadeisha Betsch, MRN: [id number],     Individualized Treatment Plan Strengths: Enjoys reading, Seeking counseling, In training program through [employer], I like humor and jokes  Supports: Her dog, her bestfriend since childhood, her parents, her sister    Goal/Needs for Treatment:  In order of importance to patient 1) Emotional Regulation/Anxiety 2) Improve Positive self talk      Client Statement of Needs: Find ways of being nicer to myself, cope w/ anxiety and worry    Treatment Level:Outpatient Counseling  Symptoms:Anxiety, Perseverative worry, Negative self talk  Client Treatment Preferences:weekly counseling,  Prefers to be called Jill Alexander    Healthcare consumer's goal for treatment:  Dr. Sinthia Karabin will support the patient's ability to achieve the goals identified. Cognitive Behavioral Therapy, Supportive Counseling, Mindfulness and Relaxation Strategies and other evidenced-based practices will be used to promote progress towards healthy functioning.    Healthcare consumer will: Actively participate in therapy, working towards healthy functioning.     *Justification for Continuation/Discontinuation of Goal: R=Revised, O=Ongoing, A=Achieved, D=Discontinued   Goal 1) Jill Alexander will develop strategies to regulate her emotions and improve her positive self talk   Objective: Jill Alexander will incorporate her coping skills into her routine and use them more effectively and consistently   Likert rating baseline date 04/12/21: How Easy - 3; How Often - 50% Target Date Goal Was  reviewed Status Code Progress towards goal/Likert rating  05/05/2024  05/06/2023 updated Ongoing                      Goal 2: Jill Alexander will improve communication with others, especially close family members, by working on her tendency to assume the worst.   Target Date: 05/05/2024  Interventions: Therapist will help Jill Alexander to notice and disengage from maladaptive thoughts and behaviors using CBT based strategies Jill Alexander will have opportunities to process her experiences in session Therapist will engage Jill Alexander in discussion of coping strategies and creation of a plan to use them regularly Therapist will engage Jill Alexander in regular mood checks to continue to monitor her general mental health, prioritizing this at the start of the session to ensure it is covered   Therapist will provide additional resources as appropriate   Jill Alexander participated in development of tx plan and provided verbal consent.   Iwao Shamblin L Dawud Mays, PhD               Andriette LITTIE Ponto, PhD

## 2024-03-08 ENCOUNTER — Other Ambulatory Visit (HOSPITAL_COMMUNITY): Payer: Self-pay

## 2024-03-09 ENCOUNTER — Ambulatory Visit: Admitting: Obstetrics and Gynecology

## 2024-03-09 ENCOUNTER — Ambulatory Visit: Admitting: Clinical

## 2024-03-09 ENCOUNTER — Encounter: Payer: Self-pay | Admitting: Obstetrics and Gynecology

## 2024-03-09 VITALS — BP 108/75 | HR 96 | Ht 62.0 in | Wt 131.1 lb

## 2024-03-09 DIAGNOSIS — Z01419 Encounter for gynecological examination (general) (routine) without abnormal findings: Secondary | ICD-10-CM

## 2024-03-09 DIAGNOSIS — F840 Autistic disorder: Secondary | ICD-10-CM | POA: Diagnosis not present

## 2024-03-09 DIAGNOSIS — R569 Unspecified convulsions: Secondary | ICD-10-CM | POA: Diagnosis not present

## 2024-03-09 MED ORDER — SLYND 4 MG PO TABS
1.0000 | ORAL_TABLET | Freq: Every day | ORAL | 11 refills | Status: AC
  Filled 2024-03-10: qty 28, 28d supply, fill #0
  Filled 2024-04-18: qty 28, 28d supply, fill #1
  Filled 2024-05-10: qty 28, 28d supply, fill #2
  Filled 2024-05-10: qty 28, 28d supply, fill #0

## 2024-03-09 NOTE — Progress Notes (Signed)
 Time: 11:00 am-11:58 am CPT Code: 09162E Diagnosis: F84.0  Jill Alexander was seen in person for therapy. She demonstrated improved affect since the last appointment, and reported that she has been enjoying meeting with the [employer] group to work on a play, and is considering reaching out to several participants about seeing each other outside of the group. Session focused on her difficulty getting out of the house for activities. Therapist engaged her in consideration of what helps motivate her, and she identified accountability as a factor. Therapist suggested focusing on activities she has to commit to in advance in some form. She is scheduled to be seen again in one week.   Coping Plan 10/09/2023 Watch something funny on TV Listen to a podcast Pet the kitties  Reading some fanfiction Eat an apple (or banana) Eat a piece of chocolate Go for a walk Ask for a hug Read a book or listen to an [employer] a friend (talk to your parents, siblings, a friend) Tell your parents Go to the emergency room    Intake Presenting Problem Jill Alexander presented seeking CBT to help her manage difficulties with emotion regulation and reframing negative thought patterns that contribute to anxiety and worry. Jill Alexander has a diagnosis of Autism Spectrum Disorder (ASD). Symptoms feeling nervous or on edge, difficulty controlling worry, worrying about a variety of things, difficulty relaxing, irritability, fears for the future, feelings of hopelessness, difficulty sleeping, feeling tired, appetite irregularities, poor self-image, passive suicidal ideation without plan or intent (client contracted for safety during initial session) History of Problem  Jill Alexander reported that she moved from [place]  with her family in July of 2018. Since that time, she has been seen by a provider in the area for challenges with emotion regulation and anxiety related to her diagnosis of autism spectrum disorder. Jill Alexander lives with her parents. She has  a 30 year-old sister who has struggled with similar issues, as well as a brother who is three years older than she is and lives in [place]. Jill Alexander described a long history of therapy and therapeutic programs in order to help her manage these issues, including a partial hospitalization program in December of 2015.  Recent Trigger  Jill Alexander reported that she was seeking a change in pace with regard to her therapeutic treatment in order to ensure continued progress.  Marital and Family Information  Present family concerns/problems: Jill Alexander shared several accounts of disputes with her mother, including her mother becoming upset with her for sharing worries with her immediately after her mother has woken up, despite her mother's requests that she not do so.  Strengths/resources in the family/friends: Jill Alexander described overall supportive relationships with her parents.  Marital/sexual history patterns: N/A  Family of Origin  Problems in family of origin: None reported.  Family background / ethnic factors: none reported.  No needs/concerns related to ethnicity reported when asked: No  Education/Vocation  Interpersonal concerns/problems: Jill Alexander reported that she has difficulties with social interactions related to her ASD diagnosis, and often perseverates about perceived missteps in both the immediate and distant past.  Personal strengths: Jill Alexander presented as motivated to improve her situation, and with some insight as to what is effective for her.  Military/work problems/concerns: None noted.  Leisure Activities/Daily Functioning  impaired functioning  Legal Status  No Legal Problems  Medical/Nutritional Concerns  unspecified  Comments: Jill Alexander reported that she often perseverates on perceived medical problems, and described herself as a hypochondriac  Religion/Spirituality  Not reported  Other  General Behavior: cooperative  Attire:  appropriate  Gait: normal  Motor Activity: normal  Stream of Thought -  Productivity: spontaneous  Stream of thought - Progression: normal  Stream of thought - Language: normal  Emotional tone and reactions - Affect: appropriate  Mental trend/Content of thoughts - Perception: normal  Mental trend/Content of thoughts - Orientation: normal  Mental trend/Content of thoughts - Memory: normal  Mental trend/Content of thoughts - General knowledge: consistent with education  Insight: good  Judgment: good  Intelligence: average  Diagnostic Summary  Autism Spectrum Disorder (F84.0)   [facility] Counselor/Therapist Progress Note   Patient ID: Jill Alexander, MRN: [id number],     Individualized Treatment Plan Strengths: Enjoys reading, Seeking counseling, In training program through [employer], I like humor and jokes  Supports: Her dog, her bestfriend since childhood, her parents, her sister    Goal/Needs for Treatment:  In order of importance to patient 1) Emotional Regulation/Anxiety 2) Improve Positive self talk      Client Statement of Needs: Find ways of being nicer to myself, cope w/ anxiety and worry    Treatment Level:Outpatient Counseling  Symptoms:Anxiety, Perseverative worry, Negative self talk  Client Treatment Preferences:weekly counseling,  Prefers to be called Jill Alexander    Healthcare consumer's goal for treatment:  Dr. Cierra Rothgeb will support the patient's ability to achieve the goals identified. Cognitive Behavioral Therapy, Supportive Counseling, Mindfulness and Relaxation Strategies and other evidenced-based practices will be used to promote progress towards healthy functioning.    Healthcare consumer will: Actively participate in therapy, working towards healthy functioning.     *Justification for Continuation/Discontinuation of Goal: R=Revised, O=Ongoing, A=Achieved, D=Discontinued   Goal 1) Jill Alexander will develop strategies to regulate her emotions and improve her positive self talk   Objective: Jill Alexander will  incorporate her coping skills into her routine and use them more effectively and consistently   Likert rating baseline date 04/12/21: How Easy - 3; How Often - 50% Target Date Goal Was reviewed Status Code Progress towards goal/Likert rating  05/05/2024  05/06/2023 updated Ongoing                      Goal 2: Jill Alexander will improve communication with others, especially close family members, by working on her tendency to assume the worst.   Target Date: 05/05/2024  Interventions: Therapist will help Jill Alexander to notice and disengage from maladaptive thoughts and behaviors using CBT based strategies Jill Alexander will have opportunities to process her experiences in session Therapist will engage Jill Alexander in discussion of coping strategies and creation of a plan to use them regularly Therapist will engage Jill Alexander in regular mood checks to continue to monitor her general mental health, prioritizing this at the start of the session to ensure it is covered   Therapist will provide additional resources as appropriate   Jill Alexander participated in development of tx plan and provided verbal consent.   Jill Mcconnell L Tracey Hermance, PhD               Jill Lopezmartinez L Petrina Melby, PhD               Jill LITTIE Ponto, PhD

## 2024-03-09 NOTE — Progress Notes (Unsigned)
 GYNECOLOGY ANNUAL PREVENTATIVE CARE ENCOUNTER NOTE  History:     Jill Alexander is a 28 y.o. G0P0000 female here for a routine annual gynecologic exam.  Current complaints: None.   Denies abnormal vaginal bleeding, discharge, pelvic pain, problems with intercourse or other gynecologic concerns.   Confirmed that patient is taking Lamictal  for seizure disorder. Her seizures are well controlled. She reports worsening HA and has seen neurology recently for treatment. She is currently on COC.    Gynecologic History No LMP recorded. (Menstrual status: Oral contraceptives). Contraception: none Last Pap: 2021. 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     Dextromethorphan -buPROPion  ER (AUVELITY ) 45-105 MG TBCR Take 1 tablet by mouth 2 (two) times daily. 60 tablet 2   Docusate Sodium (COLACE PO) Take by mouth.     DULoxetine  (CYMBALTA ) 60 MG capsule Take 2 capsules (120 mg total) by mouth daily. 60 capsule 1   Famotidine (PEPCID PO) Take by mouth.     guanFACINE  (INTUNIV ) 2 MG TB24 ER tablet Take 1 tablet (2 mg total) by mouth at bedtime for sleep. 90 tablet 1   Ibuprofen (MOTRIN PO) Take 200 mg by mouth as needed.     lamoTRIgine  (LAMICTAL ) 200 MG tablet Take 2 tablets (400 mg total) by mouth daily. 180 tablet 1   LORazepam   (ATIVAN ) 1 MG tablet Take 1 tablet (1 mg total) by mouth 2 (two) times daily as needed for severe anxiety. 60 tablet 1   Lurasidone  HCl 60 MG TABS Take 1 tablet (60 mg total) by mouth every evening with food. 90 tablet 1   melatonin 1 MG TABS tablet Take 1 tablet (1 mg total) by mouth at bedtime as needed (insomnia). 30 tablet 0   Multiple Vitamins-Minerals (MULTIVITAMIN PO) Take 1 tablet by mouth daily.     pantoprazole  (PROTONIX ) 40 MG tablet Take 1 tablet (40 mg total) by mouth 2 (two) times daily. 60 tablet 1   polyethylene glycol (MIRALAX ) packet Take 17 g by mouth daily as needed. 14 each 0   rizatriptan  (MAXALT ) 10 MG tablet Take 1 tablet (10 mg total) by mouth as needed for migraine. May repeat in 2 hours if needed. 10 tablet 11   Caraway Oil-Levomenthol (FDGARD) 25-20.75 MG CAPS Use as directed (Patient not taking: Reported on 03/09/2024) 12 capsule 0   DULoxetine  (CYMBALTA ) 60 MG capsule Take 2 capsules (120 mg total) by mouth daily. 180 capsule 1   Fremanezumab -vfrm (AJOVY ) 225 MG/1.5ML SOAJ Inject 225 mg into the skin every 30 (thirty) days. (Patient not taking: Reported on 03/09/2024) 1.5 mL 11   lamoTRIgine  (LAMICTAL ) 200 MG tablet Take 2 tablets (400  mg total) by mouth daily. 180 tablet 1   mirabegron  ER (MYRBETRIQ ) 50 MG TB24 tablet Take 1 tablet (50 mg total) by mouth daily. 30 tablet 3   mirabegron  ER (MYRBETRIQ ) 50 MG TB24 tablet 1 tablet Orally Once a day     topiramate  (TOPAMAX ) 50 MG tablet Take 1 tablet (50 mg total) by mouth daily. 90 tablet 3   [DISCONTINUED] Norethin  Ace-Eth Estrad-FE (TAYTULLA ) 1-20 MG-MCG(24) CAPS TAKE 1 CAPSULE BY MOUTH DAILY. 28 capsule 11   No current facility-administered medications on file prior to visit.    Allergies  Allergen Reactions   Gluten Meal     Social History:  reports that she has never smoked. She has never used smokeless tobacco. She reports current alcohol use. She reports that she does not use drugs.  Family History   Problem Relation Age of Onset   Depression Father    Depression Sister    Anxiety disorder Sister    Diabetes Maternal Grandfather    Cancer Maternal Grandfather    Breast cancer Paternal Grandmother    Uterine cancer Paternal Grandmother    Irritable bowel syndrome Paternal Grandmother    Colon cancer Neg Hx    Esophageal cancer Neg Hx    Stomach cancer Neg Hx    Rectal cancer Neg Hx     The following portions of the patient's history were reviewed and updated as appropriate: allergies, current medications, past family history, past medical history, past social history, past surgical history and problem list.  Review of Systems Pertinent items noted in HPI and remainder of comprehensive ROS otherwise negative.  Physical Exam:  BP 108/75   Pulse 96   Ht 5' 2 (1.575 m)   Wt 131 lb 1.3 oz (59.5 kg)   BMI 23.97 kg/m  CONSTITUTIONAL: Well-developed, well-nourished female in no acute distress.  HENT:  Normocephalic, atraumatic, External right and left ear normal.  EYES: Conjunctivae and EOM are normal. Pupils are equal, round, and reactive to light. No scleral icterus.  NECK: Normal range of motion, supple, no masses.  Normal thyroid .  SKIN: Skin is warm and dry. No rash noted. Not diaphoretic. No erythema. No pallor. MUSCULOSKELETAL: Normal range of motion. No tenderness.  No cyanosis, clubbing, or edema. NEUROLOGIC: Alert and oriented to person, place, and time. Normal reflexes, muscle tone coordination.  PSYCHIATRIC: Normal mood and affect. Normal behavior. Normal judgment and thought content. CARDIOVASCULAR: Normal heart rate noted, regular rhythm RESPIRATORY: Clear to auscultation bilaterally. Effort and breath sounds normal, no problems with respiration noted. BREASTS: Symmetric in size. No masses, tenderness, skin changes, nipple drainage, or lymphadenopathy bilaterally. Performed in the presence of a chaperone. ABDOMEN: Soft, no distention noted.  No tenderness, rebound or  guarding.  PELVIC: Deferred    Assessment and Plan:   1. Women's annual routine gynecological examination (Primary) Pap next year Will need premedication due to severe anxiety. We used Valium  and flexeril  with last pap and it worked well.  She is considering transferring care to [facility] due to better location as she does not drive.   2. Seizures (HCC)  On lamictal  and stable Has a neurologist.   Discussed that lamictal  and OCP's may have drug to drug interactions. And given that it is an MEC risk 3, We should trial progesterone only option. If this does not control her menstrual cycles we can discuss further the risks of going back on OCP's.  Rx: Slynd .    Normal breast examination today, she was advised to perform  periodic self breast examinations.  Routine preventative health maintenance measures emphasized. Please refer to After Visit Summary for other counseling recommendations.     Eliot Bencivenga, Delon FERNS, NP [facility] [employer], [facility]

## 2024-03-10 ENCOUNTER — Other Ambulatory Visit (HOSPITAL_COMMUNITY): Payer: Self-pay

## 2024-03-10 ENCOUNTER — Other Ambulatory Visit: Payer: Self-pay

## 2024-03-15 ENCOUNTER — Other Ambulatory Visit (HOSPITAL_COMMUNITY): Payer: Self-pay

## 2024-03-15 ENCOUNTER — Other Ambulatory Visit: Payer: Self-pay

## 2024-03-15 MED ORDER — MIRABEGRON ER 50 MG PO TB24
50.0000 mg | ORAL_TABLET | Freq: Every day | ORAL | 3 refills | Status: AC
  Filled 2024-03-15: qty 90, 90d supply, fill #0
  Filled 2024-05-09 – 2024-05-31 (×3): qty 90, 90d supply, fill #1
  Filled 2024-05-31: qty 90, 90d supply, fill #0

## 2024-03-16 ENCOUNTER — Ambulatory Visit: Admitting: Clinical

## 2024-03-16 DIAGNOSIS — F840 Autistic disorder: Secondary | ICD-10-CM | POA: Diagnosis not present

## 2024-03-16 NOTE — Progress Notes (Signed)
 Time: 11:00 am-11:58 am CPT Code: 09162E Diagnosis: F84.0  Jill Alexander was seen in person for therapy. Session focused on thoughts she has been having about dating. She processed her desired qualities, as well as questions she has had regarding how to meet people and how many dates to go on before deciding whether she wants a relationship. Therapist encouraged her to consider qualities she would like, as well as how she feels when she is around the person. She is scheduled to be seen again in two weeks.   Coping Plan 10/09/2023 Watch something funny on TV Listen to a podcast Pet the kitties  Reading some fanfiction Eat an apple (or banana) Eat a piece of chocolate Go for a walk Ask for a hug Read a book or listen to an [employer] a friend (talk to your parents, siblings, a friend) Tell your parents Go to the emergency room    Intake Presenting Problem Jill Alexander presented seeking CBT to help her manage difficulties with emotion regulation and reframing negative thought patterns that contribute to anxiety and worry. Jill Alexander has a diagnosis of Autism Spectrum Disorder (ASD). Symptoms feeling nervous or on edge, difficulty controlling worry, worrying about a variety of things, difficulty relaxing, irritability, fears for the future, feelings of hopelessness, difficulty sleeping, feeling tired, appetite irregularities, poor self-image, passive suicidal ideation without plan or intent (client contracted for safety during initial session) History of Problem  Jill Alexander reported that she moved from [place]  with her family in July of 2018. Since that time, she has been seen by a provider in the area for challenges with emotion regulation and anxiety related to her diagnosis of autism spectrum disorder. Jill Alexander lives with her parents. She has a 15 year-old sister who has struggled with similar issues, as well as a brother who is three years older than she is and lives in [place]. Jill Alexander described a long history  of therapy and therapeutic programs in order to help her manage these issues, including a partial hospitalization program in December of 2015.  Recent Trigger  Jill Alexander reported that she was seeking a change in pace with regard to her therapeutic treatment in order to ensure continued progress.  Marital and Family Information  Present family concerns/problems: Jill Alexander shared several accounts of disputes with her mother, including her mother becoming upset with her for sharing worries with her immediately after her mother has woken up, despite her mother's requests that she not do so.  Strengths/resources in the family/friends: Jill Alexander described overall supportive relationships with her parents.  Marital/sexual history patterns: N/A  Family of Origin  Problems in family of origin: None reported.  Family background / ethnic factors: none reported.  No needs/concerns related to ethnicity reported when asked: No  Education/Vocation  Interpersonal concerns/problems: Jill Alexander reported that she has difficulties with social interactions related to her ASD diagnosis, and often perseverates about perceived missteps in both the immediate and distant past.  Personal strengths: Jill Alexander presented as motivated to improve her situation, and with some insight as to what is effective for her.  Military/work problems/concerns: None noted.  Leisure Activities/Daily Functioning  impaired functioning  Legal Status  No Legal Problems  Medical/Nutritional Concerns  unspecified  Comments: Jill Alexander reported that she often perseverates on perceived medical problems, and described herself as a hypochondriac  Religion/Spirituality  Not reported  Other  General Behavior: cooperative  Attire: appropriate  Gait: normal  Motor Activity: normal  Stream of Thought - Productivity: spontaneous  Stream of thought - Progression: normal  Stream  of thought - Language: normal  Emotional tone and reactions - Affect: appropriate  Mental  trend/Content of thoughts - Perception: normal  Mental trend/Content of thoughts - Orientation: normal  Mental trend/Content of thoughts - Memory: normal  Mental trend/Content of thoughts - General knowledge: consistent with education  Insight: good  Judgment: good  Intelligence: average  Diagnostic Summary  Autism Spectrum Disorder (F84.0)   [facility] Counselor/Therapist Progress Note   Patient ID: Jill Alexander, MRN: [id number],     Individualized Treatment Plan Strengths: Enjoys reading, Seeking counseling, In training program through [employer], I like humor and jokes  Supports: Her dog, her bestfriend since childhood, her parents, her sister    Goal/Needs for Treatment:  In order of importance to patient 1) Emotional Regulation/Anxiety 2) Improve Positive self talk      Client Statement of Needs: Find ways of being nicer to myself, cope w/ anxiety and worry    Treatment Level:Outpatient Counseling  Symptoms:Anxiety, Perseverative worry, Negative self talk  Client Treatment Preferences:weekly counseling,  Prefers to be called Jill Alexander    Healthcare consumer's goal for treatment:  Dr. Welton Bord will support the patient's ability to achieve the goals identified. Cognitive Behavioral Therapy, Supportive Counseling, Mindfulness and Relaxation Strategies and other evidenced-based practices will be used to promote progress towards healthy functioning.    Healthcare consumer will: Actively participate in therapy, working towards healthy functioning.     *Justification for Continuation/Discontinuation of Goal: R=Revised, O=Ongoing, A=Achieved, D=Discontinued   Goal 1) Jill Alexander will develop strategies to regulate her emotions and improve her positive self talk   Objective: Jill Alexander will incorporate her coping skills into her routine and use them more effectively and consistently   Likert rating baseline date 04/12/21: How Easy - 3; How Often - 50% Target Date  Goal Was reviewed Status Code Progress towards goal/Likert rating  05/05/2024  05/06/2023 updated Ongoing                      Goal 2: Jill Alexander will improve communication with others, especially close family members, by working on her tendency to assume the worst.   Target Date: 05/05/2024  Interventions: Therapist will help Jill Alexander to notice and disengage from maladaptive thoughts and behaviors using CBT based strategies Jill Alexander will have opportunities to process her experiences in session Therapist will engage Jill Alexander in discussion of coping strategies and creation of a plan to use them regularly Therapist will engage Jill Alexander in regular mood checks to continue to monitor her general mental health, prioritizing this at the start of the session to ensure it is covered   Therapist will provide additional resources as appropriate   Jill Alexander participated in development of tx plan and provided verbal consent.     Michall Noffke L Norris Bodley, PhD               Andriette LITTIE Ponto, PhD

## 2024-03-21 ENCOUNTER — Ambulatory Visit (HOSPITAL_COMMUNITY)
Admission: EM | Admit: 2024-03-21 | Discharge: 2024-03-21 | Disposition: A | Discharge status: Home / Self Care | Attending: Nurse Practitioner | Admitting: Nurse Practitioner

## 2024-03-21 ENCOUNTER — Ambulatory Visit (INDEPENDENT_AMBULATORY_CARE_PROVIDER_SITE_OTHER)

## 2024-03-21 ENCOUNTER — Encounter (HOSPITAL_COMMUNITY): Payer: Self-pay | Admitting: *Deleted

## 2024-03-21 ENCOUNTER — Other Ambulatory Visit: Payer: Self-pay

## 2024-03-21 DIAGNOSIS — S90851A Superficial foreign body, right foot, initial encounter: Secondary | ICD-10-CM | POA: Diagnosis not present

## 2024-03-21 DIAGNOSIS — S99921A Unspecified injury of right foot, initial encounter: Secondary | ICD-10-CM | POA: Diagnosis not present

## 2024-03-21 NOTE — ED Provider Notes (Signed)
 [facility]    CSN: [id number] Arrival date & time: 03/21/24  1232      History   Chief Complaint Chief Complaint  Patient presents with   Foreign Body in Skin    HPI Jill Alexander is a 28 y.o. female.   Discussed the use of AI scribe software for clinical note transcription with the patient, who gave verbal consent to proceed.   Patient presents after accidentally stepping on glass earlier today and is concerned about possible retained glass fragments. She reports that she was walking barefoot at her mother's house when she stepped on broken glass. Her mother removed a visible piece of glass from the foot at the time of the injury. The patient experienced pain immediately afterward, but states the pain has since improved, and she no longer feels as though anything is still in the foot. Her mother requested medical evaluation to ensure that no fragments remain and to prevent infection.  The following sections of the patient's history were reviewed and updated as appropriate: allergies, current medications, past family history, past medical history, past social history, past surgical history, and problem list.         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    Patient Active Problem List   Diagnosis Date Noted   Dysuria 01/11/2023   Excessive daytime sleepiness 03/18/2022   Non-restorative sleep 03/18/2022   Chronic fatigue and malaise 03/18/2022   Loud snoring 03/18/2022   Chiari malformation type I (HCC) 03/18/2022   GAD (generalized anxiety disorder) 02/06/2022   MDD (major depressive disorder), recurrent severe, 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   Women's annual routine gynecological examination 12/25/2021    Encounter for birth control pills maintenance 11/22/2021   History of Chiari malformation 09/26/2021   Urinary urgency 09/26/2021   Common migraine with intractable migraine 07/01/2019   Intermittent headache 10/26/2018   Epigastric pain 10/26/2018   Gastroesophageal reflux disease 10/26/2018   Bipolar 2 disorder (HCC) 06/02/2018   Panic attacks 06/02/2018   Autism spectrum disorder 05/06/2018   Anxiety and depression 05/06/2018   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 Authorizing Provider  acetaminophen (TYLENOL) 500 MG tablet Take 500 mg by mouth every 6 (six) hours as needed.   Yes [provider]  Dextromethorphan -buPROPion  ER (AUVELITY ) 45-105 MG TBCR Take 1 tablet by mouth 2 (two) times daily. 03/01/24  Yes   Docusate Sodium (COLACE PO) Take by mouth.   Yes [provider]  DULoxetine  (  CYMBALTA ) 60 MG capsule Take 2 capsules (120 mg total) by mouth daily. 02/09/24  Yes   Famotidine (PEPCID PO) Take by mouth.   Yes [provider]  Ibuprofen (MOTRIN PO) Take 200 mg by mouth as needed.   Yes [provider]  lamoTRIgine  (LAMICTAL ) 200 MG tablet Take 2 tablets (400 mg total) by mouth daily. 09/23/23  Yes   lamoTRIgine  (LAMICTAL ) 200 MG tablet Take 2 tablets (400 mg total) by mouth daily. 02/23/24  Yes   LORazepam  (ATIVAN ) 1 MG tablet Take 1 tablet (1 mg total) by mouth 2 (two) times daily as needed for severe anxiety. 11/17/23  Yes   Lurasidone  HCl 60 MG TABS Take 1 tablet (60 mg total) by mouth every evening with food.  02/23/24  Yes   melatonin 1 MG TABS tablet Take 1 tablet (1 mg total) by mouth at bedtime as needed (insomnia). 10/07/19  Yes Brutus Dales, MD  mirabegron  ER (MYRBETRIQ ) 50 MG TB24 tablet Take 1 tablet (50 mg total) by mouth daily. 05/07/23  Yes   mirabegron  ER (MYRBETRIQ ) 50 MG TB24 tablet Take 1 tablet (50 mg total) by mouth daily. 03/15/24  Yes   Multiple Vitamins-Minerals (MULTIVITAMIN PO) Take 1 tablet by mouth daily.   Yes [provider]  pantoprazole  (PROTONIX ) 40 MG tablet Take 1 tablet (40 mg total) by mouth 2 (two) times daily. 02/24/24  Yes Armbruster, Elspeth SQUIBB, MD  polyethylene glycol (MIRALAX ) packet Take 17 g by mouth daily as needed. 11/19/17  Yes Armbruster, Elspeth SQUIBB, MD  rizatriptan  (MAXALT ) 10 MG tablet Take 1 tablet (10 mg total) by mouth as needed for migraine. May repeat in 2 hours if needed. 02/03/24  Yes Dohmeier, Dedra, MD  Caraway Oil-Levomenthol (FDGARD) 25-20.75 MG CAPS Use as directed Patient not taking: Reported on 03/09/2024 02/18/24   Armbruster, Elspeth SQUIBB, MD  Drospirenone  (SLYND ) 4 MG TABS Take 1 tablet (4 mg total) by mouth daily. 03/09/24   Rasch, Delon I, NP  DULoxetine  (CYMBALTA ) 60 MG capsule Take 2 capsules (120 mg total) by mouth daily. 02/23/24     Fremanezumab -vfrm (AJOVY ) 225 MG/1.5ML SOAJ Inject 225 mg into the skin every 30 (thirty) days. Patient not taking: Reported on 03/09/2024 01/08/23   Gayland Lauraine PARAS, NP  guanFACINE  (INTUNIV ) 2 MG TB24 ER tablet Take 1 tablet (2 mg total) by mouth at bedtime for sleep. 12/22/23     mirabegron  ER (MYRBETRIQ ) 50 MG TB24 tablet 1 tablet Orally Once a day    [provider]  topiramate  (TOPAMAX ) 50 MG tablet Take 1 tablet (50 mg total) by mouth daily. 02/03/24   Dohmeier, Dedra, MD  Norethin  Ace-Eth Estrad-FE (TAYTULLA ) 1-20 MG-MCG(24) CAPS TAKE 1 CAPSULE BY MOUTH DAILY. 02/23/21 07/27/21  Fredirick Glenys RAMAN, MD    Family History Family History  Problem Relation Age of Onset   Depression Father     Depression Sister    Anxiety disorder Sister    Diabetes Maternal Grandfather    Cancer Maternal Grandfather    Breast cancer Paternal Grandmother    Uterine cancer Paternal Grandmother    Irritable bowel syndrome Paternal Grandmother    Colon cancer Neg Hx    Esophageal cancer Neg Hx    Stomach cancer Neg Hx    Rectal cancer Neg Hx     Social History Social History   Tobacco Use   Smoking status: Never   Smokeless tobacco: Never  Vaping Use   Vaping status: Never Used  Substance Use Topics  Alcohol use: Yes    Comment: 1 cocktail a few times a year   Drug use: No     Allergies   Gluten meal   Review of Systems Review of Systems  Musculoskeletal:  Positive for arthralgias. Negative for gait problem.  Skin:  Positive for wound.  Neurological:  Negative for weakness and numbness.  All other systems reviewed and are negative.    Physical Exam Triage Vital Signs ED Triage Vitals  Encounter Vitals Group     BP 03/21/24 1501 112/80     Girls Systolic BP Percentile --      Girls Diastolic BP Percentile --      Boys Systolic BP Percentile --      Boys Diastolic BP Percentile --      Pulse Rate 03/21/24 1501 70     Resp 03/21/24 1501 18     Temp 03/21/24 1501 98.5 F (36.9 C)     Temp src --      SpO2 03/21/24 1501 100 %     Weight --      Height --      Head Circumference --      Peak Flow --      Pain Score 03/21/24 1502 0     Pain Loc --      Pain Education --      Exclude from Growth Chart --    No data found.  Updated Vital Signs BP 112/80   Pulse 70   Temp 98.5 F (36.9 C)   Resp 18   LMP  (LMP Unknown) Comment: PT does not have perioda with BC  SpO2 100%   Visual Acuity Right Eye Distance:   Left Eye Distance:   Bilateral Distance:    Right Eye Near:   Left Eye Near:    Bilateral Near:     Physical Exam Vitals reviewed.  Constitutional:      General: She is awake. She is not in acute distress.    Appearance: Normal appearance.  She is well-developed. She is not ill-appearing, toxic-appearing or diaphoretic.  HENT:     Head: Normocephalic.     Right Ear: Hearing normal.     Left Ear: Hearing normal.     Nose: Nose normal.     Mouth/Throat:     Mouth: Mucous membranes are moist.  Eyes:     General: Vision grossly intact.     Conjunctiva/sclera: Conjunctivae normal.  Cardiovascular:     Rate and Rhythm: Normal rate and regular rhythm.     Heart sounds: Normal heart sounds.  Pulmonary:     Effort: Pulmonary effort is normal.     Breath sounds: Normal breath sounds and air entry.  Musculoskeletal:        General: Normal range of motion.     Cervical back: Normal range of motion and neck supple.       Feet:  Feet:     Right foot:     Skin integrity: No ulcer, blister, skin breakdown, erythema or warmth.     Comments: No visualized or palpable foreign body is noted on examination of the plantar aspect of the right foot. The area shows no erythema, swelling, or focal tenderness. Skin:    General: Skin is warm and dry.  Neurological:     General: No focal deficit present.     Mental Status: She is alert and oriented to person, place, and time.  Psychiatric:  Speech: Speech normal.        Behavior: Behavior is cooperative.      UC Treatments / Results  Labs (all labs ordered are listed, but only abnormal results are displayed) Labs Reviewed - No data to display  EKG   Radiology DG Foot Complete Right Result Date: 03/21/2024 CLINICAL DATA:  Stepped on glass, concern for foreign body. EXAM: RIGHT FOOT COMPLETE - 3+ VIEW COMPARISON:  None Available. FINDINGS: There is no evidence of acute fracture or dislocation. A small skin defect is noted along the plantar aspect of the midfoot. No radiopaque foreign body is seen. IMPRESSION: No acute osseous abnormality or radiopaque foreign body. Electronically Signed   By: Leita Birmingham M.D.   On: 03/21/2024 15:51    Procedures Procedures (including  critical care time)  Medications Ordered in UC Medications - No data to display  Initial Impression / Assessment and Plan / UC Course  I have reviewed the triage vital signs and the nursing notes.  Pertinent labs & imaging results that were available during my care of the patient were reviewed by me and considered in my medical decision making (see chart for details).     The patient presents after stepping on broken glass earlier today with concern for a retained foreign body. She reports initial pain that has since resolved, and physical examination shows no tenderness, swelling, erythema, or palpable foreign body. X-ray imaging demonstrates a small skin defect on the plantar midfoot but no radiopaque foreign body. Given the normal exam and negative imaging, findings are reassuring for complete removal of the glass fragment. The wound appears clean, and no signs of infection are present. She was advised to keep the area clean with soap and water, avoid walking barefoot to prevent further injury, and monitor for redness, swelling, increased pain, or drainage. She was instructed to return for re-evaluation if symptoms develop and to seek emergency care if she experiences worsening pain, fever, or spreading redness.  Today's evaluation has revealed no signs of a dangerous process. Discussed diagnosis with patient and/or guardian. Patient and/or guardian aware of their diagnosis, possible red flag symptoms to watch out for and need for close follow up. Patient and/or guardian understands verbal and written discharge instructions. Patient and/or guardian comfortable with plan and disposition.  Patient and/or guardian has a clear mental status at this time, good insight into illness (after discussion and teaching) and has clear judgment to make decisions regarding their care  Documentation was completed with the aid of voice recognition software. Transcription may contain typographical errors.  Final  Clinical Impressions(s) / UC Diagnoses   Final diagnoses:  Foreign body in right foot, initial encounter     Discharge Instructions      You were seen today after stepping on glass. Your foot was examined, and an X-ray was taken. The X-ray did not show any glass left inside the foot, and your exam did not reveal any tenderness, swelling, or signs of a foreign body. Since your pain has resolved and everything looks normal, it is very likely that all the glass was removed. Keep the area clean by washing gently with regular soap and water. You do not need any special creams or ointments. Let the area air dry or pat dry after washing. Try to avoid walking barefoot for the next few days so the skin can fully heal and to prevent another injury. Watch the area closely for any signs of infection, such as redness, swelling, warmth, drainage, or  return of pain. Mild soreness is normal, but new or worsening pain is not. If any of these symptoms develop, or if you notice something that looks like a piece of glass coming to the surface, please follow up with your primary care provider. Go to the emergency department if you develop fever, spreading redness, severe pain, or difficulty walking.      ED Prescriptions   None    PDMP not reviewed this encounter.   Iola Lukes, [place] 03/21/24 [phone]

## 2024-03-21 NOTE — ED Triage Notes (Signed)
 PT reports she stepped on broken glass on floor this AM. Pt reports a glass was broken and must have been a smaller pice left on floor . Pt reports glass in RT foot.

## 2024-03-21 NOTE — Discharge Instructions (Addendum)
 You were seen today after stepping on glass. Your foot was examined, and an X-ray was taken. The X-ray did not show any glass left inside the foot, and your exam did not reveal any tenderness, swelling, or signs of a foreign body. Since your pain has resolved and everything looks normal, it is very likely that all the glass was removed. Keep the area clean by washing gently with regular soap and water. You do not need any special creams or ointments. Let the area air dry or pat dry after washing. Try to avoid walking barefoot for the next few days so the skin can fully heal and to prevent another injury. Watch the area closely for any signs of infection, such as redness, swelling, warmth, drainage, or return of pain. Mild soreness is normal, but new or worsening pain is not. If any of these symptoms develop, or if you notice something that looks like a piece of glass coming to the surface, please follow up with your primary care provider. Go to the emergency department if you develop fever, spreading redness, severe pain, or difficulty walking.

## 2024-03-23 ENCOUNTER — Ambulatory Visit: Admitting: Clinical

## 2024-03-30 ENCOUNTER — Ambulatory Visit: Admitting: Clinical

## 2024-03-30 DIAGNOSIS — F840 Autistic disorder: Secondary | ICD-10-CM

## 2024-03-30 NOTE — Progress Notes (Signed)
 Time: 11:00 am-11:58 am CPT Code: 09162E Diagnosis: F84.0  Jill Alexander was seen in person for therapy. Session began by reviewing successes from the past two weeks. Jill Alexander reported that Thanksgiving had gone well and her parents had pointed out that she had coped well with having family in town. Jill Alexander shared that she has been having difficulty getting out. Therapist pointed out challenges in her current situation and offered psychoeducation on how difficulty with executive functioning may make this especially difficult. She is scheduled to be seen again in one week.   Coping Plan 10/09/2023 Watch something funny on TV Listen to a podcast Pet the kitties  Reading some fanfiction Eat an apple (or banana) Eat a piece of chocolate Go for a walk Ask for a hug Read a book or listen to an [employer] a friend (talk to your parents, siblings, a friend) Tell your parents Go to the emergency room    Intake Presenting Problem Jill Alexander presented seeking CBT to help her manage difficulties with emotion regulation and reframing negative thought patterns that contribute to anxiety and worry. Jill Alexander has a diagnosis of Autism Spectrum Disorder (ASD). Symptoms feeling nervous or on edge, difficulty controlling worry, worrying about a variety of things, difficulty relaxing, irritability, fears for the future, feelings of hopelessness, difficulty sleeping, feeling tired, appetite irregularities, poor self-image, passive suicidal ideation without plan or intent (client contracted for safety during initial session) History of Problem  Jill Alexander reported that she moved from [place]  with her family in July of 2018. Since that time, she has been seen by a provider in the area for challenges with emotion regulation and anxiety related to her diagnosis of autism spectrum disorder. Jill Alexander lives with her parents. She has a 25 year-old sister who has struggled with similar issues, as well as a brother who is three years older  than she is and lives in [place]. Jill Alexander described a long history of therapy and therapeutic programs in order to help her manage these issues, including a partial hospitalization program in December of 2015.  Recent Trigger  Jill Alexander reported that she was seeking a change in pace with regard to her therapeutic treatment in order to ensure continued progress.  Marital and Family Information  Present family concerns/problems: Jill Alexander shared several accounts of disputes with her mother, including her mother becoming upset with her for sharing worries with her immediately after her mother has woken up, despite her mother's requests that she not do so.  Strengths/resources in the family/friends: Jill Alexander described overall supportive relationships with her parents.  Marital/sexual history patterns: N/A  Family of Origin  Problems in family of origin: None reported.  Family background / ethnic factors: none reported.  No needs/concerns related to ethnicity reported when asked: No  Education/Vocation  Interpersonal concerns/problems: Jill Alexander reported that she has difficulties with social interactions related to her ASD diagnosis, and often perseverates about perceived missteps in both the immediate and distant past.  Personal strengths: Jill Alexander presented as motivated to improve her situation, and with some insight as to what is effective for her.  Military/work problems/concerns: None noted.  Leisure Activities/Daily Functioning  impaired functioning  Legal Status  No Legal Problems  Medical/Nutritional Concerns  unspecified  Comments: Jill Alexander reported that she often perseverates on perceived medical problems, and described herself as a hypochondriac  Religion/Spirituality  Not reported  Other  General Behavior: cooperative  Attire: appropriate  Gait: normal  Motor Activity: normal  Stream of Thought - Productivity: spontaneous  Stream of thought - Progression:  normal  Stream of thought - Language: normal   Emotional tone and reactions - Affect: appropriate  Mental trend/Content of thoughts - Perception: normal  Mental trend/Content of thoughts - Orientation: normal  Mental trend/Content of thoughts - Memory: normal  Mental trend/Content of thoughts - General knowledge: consistent with education  Insight: good  Judgment: good  Intelligence: average  Diagnostic Summary  Autism Spectrum Disorder (F84.0)   [facility] Counselor/Therapist Progress Note   Patient ID: Jill Alexander, MRN: [id number],     Individualized Treatment Plan Strengths: Enjoys reading, Seeking counseling, In training program through [employer], I like humor and jokes  Supports: Her dog, her bestfriend since childhood, her parents, her sister    Goal/Needs for Treatment:  In order of importance to patient 1) Emotional Regulation/Anxiety 2) Improve Positive self talk      Client Statement of Needs: Find ways of being nicer to myself, cope w/ anxiety and worry    Treatment Level:Outpatient Counseling  Symptoms:Anxiety, Perseverative worry, Negative self talk  Client Treatment Preferences:weekly counseling,  Prefers to be called Jill Alexander    Healthcare consumer's goal for treatment:  Dr. Laporcha Marchesi will support the patient's ability to achieve the goals identified. Cognitive Behavioral Therapy, Supportive Counseling, Mindfulness and Relaxation Strategies and other evidenced-based practices will be used to promote progress towards healthy functioning.    Healthcare consumer will: Actively participate in therapy, working towards healthy functioning.     *Justification for Continuation/Discontinuation of Goal: R=Revised, O=Ongoing, A=Achieved, D=Discontinued   Goal 1) Jill Alexander will develop strategies to regulate her emotions and improve her positive self talk   Objective: Jill Alexander will incorporate her coping skills into her routine and use them more effectively and consistently   Likert rating  baseline date 04/12/21: How Easy - 3; How Often - 50% Target Date Goal Was reviewed Status Code Progress towards goal/Likert rating  05/05/2024  05/06/2023 updated Ongoing                      Goal 2: Jill Alexander will improve communication with others, especially close family members, by working on her tendency to assume the worst.   Target Date: 05/05/2024  Interventions: Therapist will help Jill Alexander to notice and disengage from maladaptive thoughts and behaviors using CBT based strategies Jill Alexander will have opportunities to process her experiences in session Therapist will engage Jill Alexander in discussion of coping strategies and creation of a plan to use them regularly Therapist will engage Jill Alexander in regular mood checks to continue to monitor her general mental health, prioritizing this at the start of the session to ensure it is covered   Therapist will provide additional resources as appropriate   Jill Alexander participated in development of tx plan and provided verbal consent.      Sola Margolis L Shajuana Mclucas, PhD               Andriette LITTIE Ponto, PhD

## 2024-04-06 ENCOUNTER — Ambulatory Visit: Admitting: Clinical

## 2024-04-06 DIAGNOSIS — F840 Autistic disorder: Secondary | ICD-10-CM

## 2024-04-06 NOTE — Progress Notes (Signed)
 Time: 11:00 am-11:58 am CPT Code: 09162E Diagnosis: F84.0  Jill Alexander was seen in person for therapy. Session began by reviewing successes from the past two weeks. Jill Alexander reported a success in having eaten regular meals, and that she had enjoyed the snow. 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 Therapist engaged her in discussion of real vs. Imagined behavior, and provided an opportunity to process. She is scheduled to be seen again in one week. Therapist suggested considering a coping plan for the holidays, during which Jill Alexander will have a two-week break from therapy.    Coping Plan 10/09/2023 Watch something funny on TV Listen to a podcast Pet the kitties  Reading some fanfiction Eat an apple (or banana) Eat a piece of chocolate Go for a walk Ask for a hug Read a book or listen to an [employer] a friend (talk to your parents, siblings, a friend) Tell your parents Go to the emergency room    Intake Presenting Problem Jill Alexander presented seeking CBT to help her manage difficulties with emotion regulation and reframing negative thought patterns that contribute to anxiety and worry. Jill Alexander has a diagnosis of Autism Spectrum Disorder (ASD). Symptoms feeling nervous or on edge, difficulty controlling worry, worrying about a variety of things, difficulty relaxing, irritability, fears for the future, feelings of hopelessness, difficulty sleeping, feeling tired, appetite irregularities, poor self-image, passive suicidal ideation without plan or intent (client contracted for safety during initial session) History of Problem  Jill Alexander reported that she moved from [place]  with her family in July of 2018. Since that time, she has been seen by a provider in the area for challenges with emotion regulation and anxiety related to her diagnosis of autism spectrum disorder. Jill Alexander lives with her parents. She has  a 2 year-old sister who has struggled with similar issues, as well as a brother who is three years older than she is and lives in [place]. Jill Alexander described a long history of therapy and therapeutic programs in order to help her manage these issues, including a partial hospitalization program in December of 2015.  Recent Trigger  Jill Alexander reported that she was seeking a change in pace with regard to her therapeutic treatment in order to ensure continued progress.  Marital and Family Information  Present family concerns/problems: Jill Alexander shared several accounts of disputes with her mother, including her mother becoming upset with her for sharing worries with her immediately after her mother has woken up, despite her mother's requests that she not do so.  Strengths/resources in the family/friends: Jill Alexander described overall supportive relationships with her parents.  Marital/sexual history patterns: N/A  Family of Origin  Problems in family of origin: None reported.  Family background / ethnic factors: none reported.  No needs/concerns related to ethnicity reported when asked: No  Education/Vocation  Interpersonal concerns/problems: Jill Alexander reported that she has difficulties with social interactions related to her ASD diagnosis, and often perseverates about perceived missteps in both the immediate and distant past.  Personal strengths: Jill Alexander presented as motivated to improve her situation, and with some insight as to what is effective for her.  Military/work problems/concerns: None noted.  Leisure Activities/Daily Functioning  impaired functioning  Legal Status  No Legal Problems  Medical/Nutritional Concerns  unspecified  Comments: Jill Alexander reported that she often perseverates on perceived medical problems, and described herself as a hypochondriac  Religion/Spirituality  Not reported  Other  General Behavior: cooperative  Attire: appropriate  Gait: normal  Motor Activity: normal  Stream of Thought -  Productivity: spontaneous  Stream of thought - Progression: normal  Stream of thought - Language: normal  Emotional tone and reactions - Affect: appropriate  Mental trend/Content of thoughts - Perception: normal  Mental trend/Content of thoughts - Orientation: normal  Mental trend/Content of thoughts - Memory: normal  Mental trend/Content of thoughts - General knowledge: consistent with education  Insight: good  Judgment: good  Intelligence: average  Diagnostic Summary  Autism Spectrum Disorder (F84.0)   [facility] Counselor/Therapist Progress Note   Patient ID: Jill Alexander, MRN: [id number],     Individualized Treatment Plan Strengths: Enjoys reading, Seeking counseling, In training program through [employer], I like humor and jokes  Supports: Her dog, her bestfriend since childhood, her parents, her sister    Goal/Needs for Treatment:  In order of importance to patient 1) Emotional Regulation/Anxiety 2) Improve Positive self talk      Client Statement of Needs: Find ways of being nicer to myself, cope w/ anxiety and worry    Treatment Level:Outpatient Counseling  Symptoms:Anxiety, Perseverative worry, Negative self talk  Client Treatment Preferences:weekly counseling,  Prefers to be called Jill Alexander    Healthcare consumer's goal for treatment:  Dr. Hill Mackie will support the patient's ability to achieve the goals identified. Cognitive Behavioral Therapy, Supportive Counseling, Mindfulness and Relaxation Strategies and other evidenced-based practices will be used to promote progress towards healthy functioning.    Healthcare consumer will: Actively participate in therapy, working towards healthy functioning.     *Justification for Continuation/Discontinuation of Goal: R=Revised, O=Ongoing, A=Achieved, D=Discontinued   Goal 1) Jill Alexander will develop strategies to regulate her emotions and improve her positive self talk   Objective: Jill Alexander will  incorporate her coping skills into her routine and use them more effectively and consistently   Likert rating baseline date 04/12/21: How Easy - 3; How Often - 50% Target Date Goal Was reviewed Status Code Progress towards goal/Likert rating  05/05/2024  05/06/2023 updated Ongoing                      Goal 2: Jill Alexander will improve communication with others, especially close family members, by working on her tendency to assume the worst.   Target Date: 05/05/2024  Interventions: Therapist will help Jill Alexander to notice and disengage from maladaptive thoughts and behaviors using CBT based strategies Jill Alexander will have opportunities to process her experiences in session Therapist will engage Jill Alexander in discussion of coping strategies and creation of a plan to use them regularly Therapist will engage Jill Alexander in regular mood checks to continue to monitor her general mental health, prioritizing this at the start of the session to ensure it is covered   Therapist will provide additional resources as appropriate   Jill Alexander participated in development of tx plan and provided verbal consent.       Ayshia Gramlich L Sarra Rachels, PhD             Andriette LITTIE Ponto, PhD

## 2024-04-13 ENCOUNTER — Ambulatory Visit: Admitting: Clinical

## 2024-04-13 DIAGNOSIS — F840 Autistic disorder: Secondary | ICD-10-CM

## 2024-04-13 NOTE — Progress Notes (Signed)
 Time: 11:00 am-11:58 am CPT Code: 09162E Diagnosis: F84.0  Jill Alexander was seen in person for therapy. She reflected upon a tendency to ruminate on perceived failures and shortcomings. Therapist pointed out Jill Alexander's tendency toward internal explanations for her feelings, and encouraged her to consider external explanations instead. Jill Alexander was able to engage in a plan for action she can engage in when stuck in a rumination, and agreed to try engaging in an activity even for a short period when feeling this way. She is scheduled to be seen again in two weeks and will reach out if she needs to be seen sooner.   Coping Plan 10/09/2023 Watch something funny on TV Listen to a podcast Pet the kitties  Reading some fanfiction Eat an apple (or banana) Eat a piece of chocolate Go for a walk Ask for a hug Read a book or listen to an [employer] a friend (talk to your parents, siblings, a friend) Tell your parents Go to the emergency room    Intake Presenting Problem Jill Alexander presented seeking CBT to help her manage difficulties with emotion regulation and reframing negative thought patterns that contribute to anxiety and worry. Jill Alexander has a diagnosis of Autism Spectrum Disorder (ASD). Symptoms feeling nervous or on edge, difficulty controlling worry, worrying about a variety of things, difficulty relaxing, irritability, fears for the future, feelings of hopelessness, difficulty sleeping, feeling tired, appetite irregularities, poor self-image, passive suicidal ideation without plan or intent (client contracted for safety during initial session) History of Problem  Jill Alexander reported that she moved from [place]  with her family in July of 2018. Since that time, she has been seen by a provider in the area for challenges with emotion regulation and anxiety related to her diagnosis of autism spectrum disorder. Jill Alexander lives with her parents. She has a 64 year-old sister who has struggled with similar issues, as well  as a brother who is three years older than she is and lives in [place]. Jill Alexander described a long history of therapy and therapeutic programs in order to help her manage these issues, including a partial hospitalization program in December of 2015.  Recent Trigger  Jill Alexander reported that she was seeking a change in pace with regard to her therapeutic treatment in order to ensure continued progress.  Marital and Family Information  Present family concerns/problems: Jill Alexander shared several accounts of disputes with her mother, including her mother becoming upset with her for sharing worries with her immediately after her mother has woken up, despite her mother's requests that she not do so.  Strengths/resources in the family/friends: Jill Alexander described overall supportive relationships with her parents.  Marital/sexual history patterns: N/A  Family of Origin  Problems in family of origin: None reported.  Family background / ethnic factors: none reported.  No needs/concerns related to ethnicity reported when asked: No  Education/Vocation  Interpersonal concerns/problems: Jill Alexander reported that she has difficulties with social interactions related to her ASD diagnosis, and often perseverates about perceived missteps in both the immediate and distant past.  Personal strengths: Jill Alexander presented as motivated to improve her situation, and with some insight as to what is effective for her.  Military/work problems/concerns: None noted.  Leisure Activities/Daily Functioning  impaired functioning  Legal Status  No Legal Problems  Medical/Nutritional Concerns  unspecified  Comments: Jill Alexander reported that she often perseverates on perceived medical problems, and described herself as a hypochondriac  Religion/Spirituality  Not reported  Other  General Behavior: cooperative  Attire: appropriate  Gait: normal  Motor Activity: normal  Stream of Thought - Productivity: spontaneous  Stream of thought - Progression: normal   Stream of thought - Language: normal  Emotional tone and reactions - Affect: appropriate  Mental trend/Content of thoughts - Perception: normal  Mental trend/Content of thoughts - Orientation: normal  Mental trend/Content of thoughts - Memory: normal  Mental trend/Content of thoughts - General knowledge: consistent with education  Insight: good  Judgment: good  Intelligence: average  Diagnostic Summary  Autism Spectrum Disorder (F84.0)   [facility] Counselor/Therapist Progress Note   Patient ID: Jill Alexander, MRN: [id number],     Individualized Treatment Plan Strengths: Enjoys reading, Seeking counseling, In training program through [employer], I like humor and jokes  Supports: Her dog, her bestfriend since childhood, her parents, her sister    Goal/Needs for Treatment:  In order of importance to patient 1) Emotional Regulation/Anxiety 2) Improve Positive self talk      Client Statement of Needs: Find ways of being nicer to myself, cope w/ anxiety and worry    Treatment Level:Outpatient Counseling  Symptoms:Anxiety, Perseverative worry, Negative self talk  Client Treatment Preferences:weekly counseling,  Prefers to be called Jill Alexander    Healthcare consumer's goal for treatment:  Dr. Ezriel Alexander will support the patient's ability to achieve the goals identified. Cognitive Behavioral Therapy, Supportive Counseling, Mindfulness and Relaxation Strategies and other evidenced-based practices will be used to promote progress towards healthy functioning.    Healthcare consumer will: Actively participate in therapy, working towards healthy functioning.     *Justification for Continuation/Discontinuation of Goal: R=Revised, O=Ongoing, A=Achieved, D=Discontinued   Goal 1) Jill Alexander will develop strategies to regulate her emotions and improve her positive self talk   Objective: Jill Alexander will incorporate her coping skills into her routine and use them more effectively  and consistently   Likert rating baseline date 04/12/21: How Easy - 3; How Often - 50% Target Date Goal Was reviewed Status Code Progress towards goal/Likert rating  05/05/2024  05/06/2023 updated Ongoing                      Goal 2: Jill Alexander will improve communication with others, especially close family members, by working on her tendency to assume the worst.   Target Date: 05/05/2024  Interventions: Therapist will help Jill Alexander to notice and disengage from maladaptive thoughts and behaviors using CBT based strategies Jill Alexander will have opportunities to process her experiences in session Therapist will engage Jill Alexander in discussion of coping strategies and creation of a plan to use them regularly Therapist will engage Jill Alexander in regular mood checks to continue to monitor her general mental health, prioritizing this at the start of the session to ensure it is covered   Therapist will provide additional resources as appropriate   Jill Alexander participated in development of tx plan and provided verbal consent.     Qiara Minetti L Haji Delaine, PhD

## 2024-04-14 DIAGNOSIS — R350 Frequency of micturition: Secondary | ICD-10-CM | POA: Diagnosis not present

## 2024-04-14 DIAGNOSIS — R351 Nocturia: Secondary | ICD-10-CM | POA: Diagnosis not present

## 2024-04-14 DIAGNOSIS — R3915 Urgency of urination: Secondary | ICD-10-CM | POA: Diagnosis not present

## 2024-04-26 ENCOUNTER — Other Ambulatory Visit (HOSPITAL_COMMUNITY): Payer: Self-pay

## 2024-04-26 ENCOUNTER — Other Ambulatory Visit: Payer: Self-pay

## 2024-04-27 ENCOUNTER — Other Ambulatory Visit (HOSPITAL_COMMUNITY): Payer: Self-pay

## 2024-04-27 ENCOUNTER — Ambulatory Visit: Admitting: Clinical

## 2024-04-27 DIAGNOSIS — F840 Autistic disorder: Secondary | ICD-10-CM | POA: Diagnosis not present

## 2024-04-27 NOTE — Progress Notes (Signed)
 Time: 11:00 am-11:58 am CPT Code: 09162E Diagnosis: F84.0  Jill Alexander was seen in person for therapy. She  reported that she had continued to feel down over the holidays, but that she is scheduled to start a new program through [facility] in late January, and is looking forward to this. Session focused on her reported tendency to fixate on bodily sensations, researching them online only to find anxiety-provoking results. Therapist encouraged her to write down her symptoms, along with the date, with the plan to schedule a doctor's appointment if the symptom is still bothering her in 2 weeks. Therapist clarified that if a symptom becomes obviously worse, she should seek help as soon as she needs, without waiting two weeks. She is scheduled to be seen again in one week.   Coping Plan 10/09/2023 Watch something funny on TV Listen to a podcast Pet the kitties  Reading some fanfiction Eat an apple (or banana) Eat a piece of chocolate Go for a walk Ask for a hug Read a book or listen to an [employer] a friend (talk to your parents, siblings, a friend) Tell your parents Go to the emergency room    Intake Presenting Problem Jill Alexander presented seeking CBT to help her manage difficulties with emotion regulation and reframing negative thought patterns that contribute to anxiety and worry. Jill Alexander has a diagnosis of Autism Spectrum Disorder (ASD). Symptoms feeling nervous or on edge, difficulty controlling worry, worrying about a variety of things, difficulty relaxing, irritability, fears for the future, feelings of hopelessness, difficulty sleeping, feeling tired, appetite irregularities, poor self-image, passive suicidal ideation without plan or intent (client contracted for safety during initial session) History of Problem  Jill Alexander reported that she moved from [place]  with her family in July of 2018. Since that time, she has been seen by a provider in the area for challenges with emotion  regulation and anxiety related to her diagnosis of autism spectrum disorder. Jill Alexander lives with her parents. She has a 30 year-old sister who has struggled with similar issues, as well as a brother who is three years older than she is and lives in [place]. Jill Alexander described a long history of therapy and therapeutic programs in order to help her manage these issues, including a partial hospitalization program in December of 2015.  Recent Trigger  Jill Alexander reported that she was seeking a change in pace with regard to her therapeutic treatment in order to ensure continued progress.  Marital and Family Information  Present family concerns/problems: Jill Alexander shared several accounts of disputes with her mother, including her mother becoming upset with her for sharing worries with her immediately after her mother has woken up, despite her mother's requests that she not do so.  Strengths/resources in the family/friends: Jill Alexander described overall supportive relationships with her parents.  Marital/sexual history patterns: N/A  Family of Origin  Problems in family of origin: None reported.  Family background / ethnic factors: none reported.  No needs/concerns related to ethnicity reported when asked: No  Education/Vocation  Interpersonal concerns/problems: Jill Alexander reported that she has difficulties with social interactions related to her ASD diagnosis, and often perseverates about perceived missteps in both the immediate and distant past.  Personal strengths: Jill Alexander presented as motivated to improve her situation, and with some insight as to what is effective for her.  Military/work problems/concerns: None noted.  Leisure Activities/Daily Functioning  impaired functioning  Legal Status  No Legal Problems  Medical/Nutritional Concerns  unspecified  Comments: Jill Alexander reported that she often perseverates on perceived  medical problems, and described herself as a hypochondriac  Religion/Spirituality  Not reported  Other   General Behavior: cooperative  Attire: appropriate  Gait: normal  Motor Activity: normal  Stream of Thought - Productivity: spontaneous  Stream of thought - Progression: normal  Stream of thought - Language: normal  Emotional tone and reactions - Affect: appropriate  Mental trend/Content of thoughts - Perception: normal  Mental trend/Content of thoughts - Orientation: normal  Mental trend/Content of thoughts - Memory: normal  Mental trend/Content of thoughts - General knowledge: consistent with education  Insight: good  Judgment: good  Intelligence: average  Diagnostic Summary  Autism Spectrum Disorder (F84.0)   Jill Alexander   Patient ID: Jill Alexander, MRN: [id number],     Individualized Treatment Plan Strengths: Enjoys reading, Seeking counseling, In training program through [employer], I like humor and jokes  Supports: Her dog, her bestfriend since childhood, her parents, her sister    Goal/Needs for Treatment:  In order of importance to patient 1) Emotional Regulation/Anxiety 2) Improve Positive self talk      Client Statement of Needs: Find ways of being nicer to myself, cope w/ anxiety and worry    Treatment Level:Outpatient Counseling  Symptoms:Anxiety, Perseverative worry, Negative self talk  Client Treatment Preferences:weekly counseling,  Prefers to be called Jill Alexander    Healthcare consumer's goal for treatment:  Dr. Deldrick Linch will support the patient's ability to achieve the goals identified. Cognitive Behavioral Therapy, Supportive Counseling, Mindfulness and Relaxation Strategies and other evidenced-based practices will be used to promote progress towards healthy functioning.    Healthcare consumer will: Actively participate in therapy, working towards healthy functioning.     *Justification for Continuation/Discontinuation of Goal: R=Revised, O=Ongoing, A=Achieved, D=Discontinued   Goal 1) Jill Alexander  will develop strategies to regulate her emotions and improve her positive self talk   Objective: Jill Alexander will incorporate her coping skills into her routine and use them more effectively and consistently   Likert rating baseline date 04/12/21: How Easy - 3; How Often - 50% Target Date Goal Was reviewed Status Code Progress towards goal/Likert rating  05/05/2024  05/06/2023 updated Ongoing                      Goal 2: Jill Alexander will improve communication with others, especially close family members, by working on her tendency to assume the worst.   Target Date: 05/05/2024  Interventions: Therapist will help Jill Alexander to notice and disengage from maladaptive thoughts and behaviors using CBT based strategies Jill Alexander will have opportunities to process her experiences in session Therapist will engage Jill Alexander in discussion of coping strategies and creation of a plan to use them regularly Therapist will engage Jill Alexander in regular mood checks to continue to monitor her general mental health, prioritizing this at the start of the session to ensure it is covered   Therapist will provide additional resources as appropriate   Jill Alexander participated in development of tx plan and provided verbal consent.     Mieczyslaw Stamas L Azekiel Cremer, PhD               Andriette LITTIE Ponto, PhD

## 2024-05-04 ENCOUNTER — Ambulatory Visit: Admitting: Clinical

## 2024-05-04 DIAGNOSIS — F840 Autistic disorder: Secondary | ICD-10-CM

## 2024-05-04 NOTE — Progress Notes (Signed)
 Time: 11:00 am-11:58 am CPT Code: 09162E Diagnosis: F84.0  Jill Alexander was seen in person for therapy. Session focused on an incident that had taken place that morning, when plaster fell from the ceiling in the kitchen, potentially due to a faucet having been left on upstairs the night before. Therapist provided an opportunity to process, normalizing Jill Alexander's experience and suggesting that her behavior had consequences but was morally neutral of itself. She is scheduled to be seen again in one week.   Coping Plan 10/09/2023 Watch something funny on TV Listen to a podcast Pet the kitties  Reading some fanfiction Eat an apple (or banana) Eat a piece of chocolate Go for a walk Ask for a hug Read a book or listen to an [employer] a friend (talk to your parents, siblings, a friend) Tell your parents Go to the emergency room    Intake Presenting Problem Jill Alexander presented seeking CBT to help her manage difficulties with emotion regulation and reframing negative thought patterns that contribute to anxiety and worry. Jill Alexander has a diagnosis of Autism Spectrum Disorder (ASD). Symptoms feeling nervous or on edge, difficulty controlling worry, worrying about a variety of things, difficulty relaxing, irritability, fears for the future, feelings of hopelessness, difficulty sleeping, feeling tired, appetite irregularities, poor self-image, passive suicidal ideation without plan or intent (client contracted for safety during initial session) History of Problem  Jill Alexander reported that she moved from [place]  with her family in July of 2018. Since that time, she has been seen by a provider in the area for challenges with emotion regulation and anxiety related to her diagnosis of autism spectrum disorder. Jill Alexander lives with her parents. She has a 68 year-old sister who has struggled with similar issues, as well as a brother who is three years older than she is and lives in [place]. Jill Alexander described a long history of  therapy and therapeutic programs in order to help her manage these issues, including a partial hospitalization program in December of 2015.  Recent Trigger  Jill Alexander reported that she was seeking a change in pace with regard to her therapeutic treatment in order to ensure continued progress.  Marital and Family Information  Present family concerns/problems: Jill Alexander shared several accounts of disputes with her mother, including her mother becoming upset with her for sharing worries with her immediately after her mother has woken up, despite her mother's requests that she not do so.  Strengths/resources in the family/friends: Jill Alexander described overall supportive relationships with her parents.  Marital/sexual history patterns: N/A  Family of Origin  Problems in family of origin: None reported.  Family background / ethnic factors: none reported.  No needs/concerns related to ethnicity reported when asked: No  Education/Vocation  Interpersonal concerns/problems: Jill Alexander reported that she has difficulties with social interactions related to her ASD diagnosis, and often perseverates about perceived missteps in both the immediate and distant past.  Personal strengths: Jill Alexander presented as motivated to improve her situation, and with some insight as to what is effective for her.  Military/work problems/concerns: None noted.  Leisure Activities/Daily Functioning  impaired functioning  Legal Status  No Legal Problems  Medical/Nutritional Concerns  unspecified  Comments: Jill Alexander reported that she often perseverates on perceived medical problems, and described herself as a hypochondriac  Religion/Spirituality  Not reported  Other  General Behavior: cooperative  Attire: appropriate  Gait: normal  Motor Activity: normal  Stream of Thought - Productivity: spontaneous  Stream of thought - Progression: normal  Stream of thought - Language: normal  Emotional  tone and reactions - Affect: appropriate  Mental  trend/Content of thoughts - Perception: normal  Mental trend/Content of thoughts - Orientation: normal  Mental trend/Content of thoughts - Memory: normal  Mental trend/Content of thoughts - General knowledge: consistent with education  Insight: good  Judgment: good  Intelligence: average  Diagnostic Summary  Autism Spectrum Disorder (F84.0)   [facility] Counselor/Therapist Progress Note   Patient ID: Jill Alexander, MRN: [id number],     Individualized Treatment Plan Strengths: Enjoys reading, Seeking counseling, In training program through [employer], I like humor and jokes  Supports: Her dog, her bestfriend since childhood, her parents, her sister    Goal/Needs for Treatment:  In order of importance to patient 1) Emotional Regulation/Anxiety 2) Improve Positive self talk      Client Statement of Needs: Find ways of being nicer to myself, cope w/ anxiety and worry    Treatment Level:Outpatient Counseling  Symptoms:Anxiety, Perseverative worry, Negative self talk  Client Treatment Preferences:weekly counseling,  Prefers to be called Jill Alexander    Healthcare consumer's goal for treatment:  Dr. Aanya Haynes will support the patient's ability to achieve the goals identified. Cognitive Behavioral Therapy, Supportive Counseling, Mindfulness and Relaxation Strategies and other evidenced-based practices will be used to promote progress towards healthy functioning.    Healthcare consumer will: Actively participate in therapy, working towards healthy functioning.     *Justification for Continuation/Discontinuation of Goal: R=Revised, O=Ongoing, A=Achieved, D=Discontinued   Goal 1) Jill Alexander will develop strategies to regulate her emotions and improve her positive self talk   Objective: Jill Alexander will incorporate her coping skills into her routine and use them more effectively and consistently   Likert rating baseline date 04/12/21: How Easy - 3; How Often - 50% Target Date  Goal Was reviewed Status Code Progress towards goal/Likert rating  05/05/2024  05/06/2023 updated Ongoing                      Goal 2: Jill Alexander will improve communication with others, especially close family members, by working on her tendency to assume the worst.   Target Date: 05/05/2024  Interventions: Therapist will help Jill Alexander to notice and disengage from maladaptive thoughts and behaviors using CBT based strategies Jill Alexander will have opportunities to process her experiences in session Therapist will engage Jill Alexander in discussion of coping strategies and creation of a plan to use them regularly Therapist will engage Jill Alexander in regular mood checks to continue to monitor her general mental health, prioritizing this at the start of the session to ensure it is covered   Therapist will provide additional resources as appropriate   Jill Alexander participated in development of tx plan and provided verbal consent.     Yon Schiffman L Onyx Edgley, PhD               Jaleal Schliep L Halsey Persaud, PhD               Andriette LITTIE Ponto, PhD

## 2024-05-09 ENCOUNTER — Other Ambulatory Visit: Payer: Self-pay | Admitting: Gastroenterology

## 2024-05-10 ENCOUNTER — Other Ambulatory Visit: Payer: Self-pay

## 2024-05-10 ENCOUNTER — Other Ambulatory Visit (HOSPITAL_COMMUNITY): Payer: Self-pay

## 2024-05-10 ENCOUNTER — Encounter (HOSPITAL_COMMUNITY): Payer: Self-pay

## 2024-05-10 MED ORDER — PANTOPRAZOLE SODIUM 40 MG PO TBEC
40.0000 mg | DELAYED_RELEASE_TABLET | Freq: Two times a day (BID) | ORAL | 3 refills | Status: AC
  Filled 2024-05-10 (×2): qty 60, 30d supply, fill #0
  Filled 2024-05-21 – 2024-05-31 (×2): qty 60, 30d supply, fill #1

## 2024-05-11 ENCOUNTER — Ambulatory Visit: Admitting: Clinical

## 2024-05-11 DIAGNOSIS — F840 Autistic disorder: Secondary | ICD-10-CM | POA: Diagnosis not present

## 2024-05-11 NOTE — Progress Notes (Addendum)
 Time: 11:00 am-11:58 am CPT Code: 09162E Diagnosis: F84.0  Jill Alexander was seen in person for therapy. She provided an update on the incident from last week, noting that her parents did not seem unreasonably angry at her, and pointing out her own tendency to focus on things about herself she does not like. She also noted a tendency to seek out social media content that makes her angry, and comment on it. Therapist provided psychoeducation and normalization, and encouraged Jill Alexander to try to focus on the kind of content she wants to see more of and support. She is scheduled to be seen again in one week.   Coping Plan 10/09/2023 Watch something funny on TV Listen to a podcast Pet the kitties  Reading some fanfiction Eat an apple (or banana) Eat a piece of chocolate Go for a walk Ask for a hug Read a book or listen to an [employer] a friend (talk to your parents, siblings, a friend) Tell your parents Go to the emergency room    Intake Presenting Problem Jill Alexander presented seeking CBT to help her manage difficulties with emotion regulation and reframing negative thought patterns that contribute to anxiety and worry. Jill Alexander has a diagnosis of Autism Spectrum Disorder (ASD). Symptoms feeling nervous or on edge, difficulty controlling worry, worrying about a variety of things, difficulty relaxing, irritability, fears for the future, feelings of hopelessness, difficulty sleeping, feeling tired, appetite irregularities, poor self-image, passive suicidal ideation without plan or intent (client contracted for safety during initial session) History of Problem  Jill Alexander reported that she moved from [place]  with her family in July of 2018. Since that time, she has been seen by a provider in the area for challenges with emotion regulation and anxiety related to her diagnosis of autism spectrum disorder. Jill Alexander lives with her parents. She has a 95 year-old sister who has struggled with similar issues, as well as a  brother who is three years older than she is and lives in [place]. Jill Alexander described a long history of therapy and therapeutic programs in order to help her manage these issues, including a partial hospitalization program in December of 2015.  Recent Trigger  Jill Alexander reported that she was seeking a change in pace with regard to her therapeutic treatment in order to ensure continued progress.  Marital and Family Information  Present family concerns/problems: Jill Alexander shared several accounts of disputes with her mother, including her mother becoming upset with her for sharing worries with her immediately after her mother has woken up, despite her mother's requests that she not do so.  Strengths/resources in the family/friends: Jill Alexander described overall supportive relationships with her parents.  Marital/sexual history patterns: N/A  Family of Origin  Problems in family of origin: None reported.  Family background / ethnic factors: none reported.  No needs/concerns related to ethnicity reported when asked: No  Education/Vocation  Interpersonal concerns/problems: Jill Alexander reported that she has difficulties with social interactions related to her ASD diagnosis, and often perseverates about perceived missteps in both the immediate and distant past.  Personal strengths: Jill Alexander presented as motivated to improve her situation, and with some insight as to what is effective for her.  Military/work problems/concerns: None noted.  Leisure Activities/Daily Functioning  impaired functioning  Legal Status  No Legal Problems  Medical/Nutritional Concerns  unspecified  Comments: Jill Alexander reported that she often perseverates on perceived medical problems, and described herself as a hypochondriac  Religion/Spirituality  Not reported  Other  General Behavior: cooperative  Attire: appropriate  Gait: normal  Motor Activity: normal  Stream of Thought - Productivity: spontaneous  Stream of thought - Progression: normal   Stream of thought - Language: normal  Emotional tone and reactions - Affect: appropriate  Mental trend/Content of thoughts - Perception: normal  Mental trend/Content of thoughts - Orientation: normal  Mental trend/Content of thoughts - Memory: normal  Mental trend/Content of thoughts - General knowledge: consistent with education  Insight: good  Judgment: good  Intelligence: average  Diagnostic Summary  Autism Spectrum Disorder (F84.0)   [facility] Counselor/Therapist Progress Note   Patient ID: Margel Joens, MRN: [id number],     Individualized Treatment Plan Strengths: Enjoys reading, Seeking counseling, In training program through [employer], I like humor and jokes  Supports: Her dog, her bestfriend since childhood, her parents, her sister    Goal/Needs for Treatment:  In order of importance to patient 1) Emotional Regulation/Anxiety 2) Improve Positive self talk      Client Statement of Needs: Find ways of being nicer to myself, cope w/ anxiety and worry    Treatment Level:Outpatient Counseling  Symptoms:Anxiety, Perseverative worry, Negative self talk  Client Treatment Preferences:weekly counseling,  Prefers to be called Jill Alexander    Healthcare consumer's goal for treatment:  Dr. Davianna Deutschman will support the patient's ability to achieve the goals identified. Cognitive Behavioral Therapy, Supportive Counseling, Mindfulness and Relaxation Strategies and other evidenced-based practices will be used to promote progress towards healthy functioning.    Healthcare consumer will: Actively participate in therapy, working towards healthy functioning.     *Justification for Continuation/Discontinuation of Goal: R=Revised, O=Ongoing, A=Achieved, D=Discontinued   Goal 1) Jill Alexander will develop strategies to regulate her emotions and improve her positive self talk   Objective: Jill Alexander will incorporate her coping skills into her routine and use them more effectively  and consistently   Target Date: 05/05/2025  Goal 2: Jill Alexander will improve communication with others, especially close family members, by working on her tendency to assume the worst.   Target Date: 05/05/2025  Interventions: Therapist will help Jill Alexander to notice and disengage from maladaptive thoughts and behaviors using CBT based strategies Jill Alexander will have opportunities to process her experiences in session Therapist will engage Jill Alexander in discussion of coping strategies and creation of a plan to use them regularly Therapist will engage Jill Alexander in regular mood checks to continue to monitor her general mental health, prioritizing this at the start of the session to ensure it is covered   Therapist will provide additional resources as appropriate   Jill Alexander participated in development of tx plan and provided verbal consent.    Mostyn Varnell L Mylasia Vorhees, PhD               Andriette LITTIE Ponto, PhD

## 2024-05-18 ENCOUNTER — Ambulatory Visit: Admitting: Clinical

## 2024-05-18 ENCOUNTER — Other Ambulatory Visit (HOSPITAL_COMMUNITY): Payer: Self-pay

## 2024-05-18 DIAGNOSIS — F840 Autistic disorder: Secondary | ICD-10-CM

## 2024-05-18 NOTE — Progress Notes (Signed)
 Time: 11:00 am-11:58 am CPT Code: 09208E Diagnosis: F84.0  Jill Alexander was seen in person for therapy. Session began by discussion of scheduling options in light of Jill Alexander starting a new day program through [facility] [employer] unexpectedly early. Therapist suggested several options, and recommended that Jill Alexander attend her first session to see if she can gather more information. Session also included review and update of Jill Alexander's treatment plan. She provided input into and verbally consented to all goals and interventions. She is scheduled to be seen again in one week.   Coping Plan 10/09/2023 Watch something funny on TV Listen to a podcast Pet the kitties  Reading some fanfiction Eat an apple (or banana) Eat a piece of chocolate Go for a walk Ask for a hug Read a book or listen to an [employer] a friend (talk to your parents, siblings, a friend) Tell your parents Go to the emergency room    Intake Presenting Problem Jill Alexander presented seeking CBT to help her manage difficulties with emotion regulation and reframing negative thought patterns that contribute to anxiety and worry. Jill Alexander has a diagnosis of Autism Spectrum Disorder (ASD). Symptoms feeling nervous or on edge, difficulty controlling worry, worrying about a variety of things, difficulty relaxing, irritability, fears for the future, feelings of hopelessness, difficulty sleeping, feeling tired, appetite irregularities, poor self-image, passive suicidal ideation without plan or intent (client contracted for safety during initial session) History of Problem  Jill Alexander reported that she moved from [place]  with her family in July of 2018. Since that time, she has been seen by a provider in the area for challenges with emotion regulation and anxiety related to her diagnosis of autism spectrum disorder. Jill Alexander lives with her parents. She has a 39 year-old sister who has struggled with similar issues, as well as a brother who is three years older than  she is and lives in [place]. Jill Alexander described a long history of therapy and therapeutic programs in order to help her manage these issues, including a partial hospitalization program in December of 2015.  Recent Trigger  Jill Alexander reported that she was seeking a change in pace with regard to her therapeutic treatment in order to ensure continued progress.  Marital and Family Information  Present family concerns/problems: Jill Alexander shared several accounts of disputes with her mother, including her mother becoming upset with her for sharing worries with her immediately after her mother has woken up, despite her mother's requests that she not do so.  Strengths/resources in the family/friends: Jill Alexander described overall supportive relationships with her parents.  Marital/sexual history patterns: N/A  Family of Origin  Problems in family of origin: None reported.  Family background / ethnic factors: none reported.  No needs/concerns related to ethnicity reported when asked: No  Education/Vocation  Interpersonal concerns/problems: Jill Alexander reported that she has difficulties with social interactions related to her ASD diagnosis, and often perseverates about perceived missteps in both the immediate and distant past.  Personal strengths: Jill Alexander presented as motivated to improve her situation, and with some insight as to what is effective for her.  Military/work problems/concerns: None noted.  Leisure Activities/Daily Functioning  impaired functioning  Legal Status  No Legal Problems  Medical/Nutritional Concerns  unspecified  Comments: Jill Alexander reported that she often perseverates on perceived medical problems, and described herself as a hypochondriac  Religion/Spirituality  Not reported  Other  General Behavior: cooperative  Attire: appropriate  Gait: normal  Motor Activity: normal  Stream of Thought - Productivity: spontaneous  Stream of thought - Progression: normal  Stream of thought - Language: normal   Emotional tone and reactions - Affect: appropriate  Mental trend/Content of thoughts - Perception: normal  Mental trend/Content of thoughts - Orientation: normal  Mental trend/Content of thoughts - Memory: normal  Mental trend/Content of thoughts - General knowledge: consistent with education  Insight: good  Judgment: good  Intelligence: average  Diagnostic Summary  Autism Spectrum Disorder (F84.0)   [facility] Counselor/Therapist Progress Note   Patient ID: Hodaya Curto, MRN: [id number],     Individualized Treatment Plan Strengths: Enjoys reading, Seeking counseling, In training program through [employer], I like humor and jokes  Supports: Her dog, her bestfriend since childhood, her parents, her sister    Goal/Needs for Treatment:  In order of importance to patient 1) Emotional Regulation/Anxiety 2) Improve Positive self talk      Client Statement of Needs: Find ways of being nicer to myself, cope w/ anxiety and worry    Treatment Level:Outpatient Counseling  Symptoms:Anxiety, Perseverative worry, Negative self talk  Client Treatment Preferences:weekly counseling,  Prefers to be called Jill Alexander    Healthcare consumer's goal for treatment:  Dr. Takasha Vetere will support the patient's ability to achieve the goals identified. Cognitive Behavioral Therapy, Supportive Counseling, Mindfulness and Relaxation Strategies and other evidenced-based practices will be used to promote progress towards healthy functioning.    Healthcare consumer will: Actively participate in therapy, working towards healthy functioning.     *Justification for Continuation/Discontinuation of Goal: R=Revised, O=Ongoing, A=Achieved, D=Discontinued   Goal 1) Jill Alexander will develop strategies to regulate her emotions and improve her positive self talk   Objective: Jill Alexander will incorporate her adaptive coping skills into her routine and use them more effectively and consistently   Target  Date: 05/18/2025 Progress: 30% Modality: Individual Frequency: Weekly  Goal 2: Jill Alexander will improve communication with others, especially close family members, by working on her tendency to assume the worst.   Target Date: 05/18/2025 Progress: 25% Modality: Individual Frequency: Weekly  Goal 3: Jill Alexander will work on increasing the frequency of positive self-talk  Target Date: 05/18/2025 Progress: 0 Modality: Individual  Frequency: Weekly  Interventions: Therapist will help Jill Alexander to notice and disengage from maladaptive thoughts and behaviors using CBT based strategies Jill Alexander will have opportunities to process her experiences in session Therapist will engage Jill Alexander in discussion of coping strategies and creation of a plan to use them regularly Therapist will engage Jill Alexander in regular mood checks to continue to monitor her general mental health, prioritizing this at the start of the session to ensure it is covered   Therapist will provide additional resources as appropriate   Jill Alexander participated in development of tx plan and provided verbal consent.    Havilah Topor L Rogen Porte, PhD

## 2024-05-21 ENCOUNTER — Other Ambulatory Visit (HOSPITAL_COMMUNITY): Payer: Self-pay

## 2024-05-21 MED ORDER — LORAZEPAM 1 MG PO TABS
1.0000 mg | ORAL_TABLET | Freq: Two times a day (BID) | ORAL | 0 refills | Status: AC | PRN
  Filled 2024-05-21: qty 60, 30d supply, fill #0

## 2024-05-25 ENCOUNTER — Ambulatory Visit: Admitting: Clinical

## 2024-05-25 DIAGNOSIS — F840 Autistic disorder: Secondary | ICD-10-CM

## 2024-05-25 NOTE — Progress Notes (Signed)
 Time: 11:00 am-11:58 am CPT Code: 09162E-04 Diagnosis: F84.0  Jill Alexander was seen remotely using secure video conferencing. She was in a secure location at the [facility] program in [place], and therapist was in her home at time of appointment. Client is aware of risks of telehealth and consented to a virtual visit. Jill Alexander reflected upon feelings that had arisen related to starting the [facility] program. She indicated that she planned to continue attending her currently scheduled appointments. Therapist offered validation and support, reframing several negative cognitions. She is scheduled to be seen again in one week.   Coping Plan 10/09/2023 Watch something funny on TV Listen to a podcast Pet the kitties  Reading some fanfiction Eat an apple (or banana) Eat a piece of chocolate Go for a walk Ask for a hug Read a book or listen to an [employer] [facility] a friend (talk to your parents, siblings, a friend) Tell your parents Go to the emergency room    Intake Presenting Problem Jill Alexander presented seeking CBT to help her manage difficulties with emotion regulation and reframing negative thought patterns that contribute to anxiety and worry. Jill Alexander has a diagnosis of Autism Spectrum Disorder (ASD). Symptoms feeling nervous or on edge, difficulty controlling worry, worrying about a variety of things, difficulty relaxing, irritability, fears for the future, feelings of hopelessness, difficulty sleeping, feeling tired, appetite irregularities, poor self-image, passive suicidal ideation without plan or intent (client contracted for safety during initial session) History of Problem  Jill Alexander reported that she moved from [place]  with her family in July of 2018. Since that time, she has been seen by a provider in the area for challenges with emotion regulation and anxiety related to her diagnosis of autism spectrum disorder. Jill Alexander lives with her parents. She has a 24 year-old sister who has struggled with similar issues,  as well as a brother who is three years older than she is and lives in [place]. Jill Alexander described a long history of therapy and therapeutic programs in order to help her manage these issues, including a partial hospitalization program in December of 2015.  Recent Trigger  Jill Alexander reported that she was seeking a change in pace with regard to her therapeutic treatment in order to ensure continued progress.  Marital and Family Information  Present family concerns/problems: Jill Alexander shared several accounts of disputes with her mother, including her mother becoming upset with her for sharing worries with her immediately after her mother has woken up, despite her mother's requests that she not do so.  Strengths/resources in the family/friends: Jill Alexander described overall supportive relationships with her parents.  Marital/sexual history patterns: N/A  Family of Origin  Problems in family of origin: None reported.  Family background / ethnic factors: none reported.  No needs/concerns related to ethnicity reported when asked: No  Education/Vocation  Interpersonal concerns/problems: Jill Alexander reported that she has difficulties with social interactions related to her ASD diagnosis, and often perseverates about perceived missteps in both the immediate and distant past.  Personal strengths: Jill Alexander presented as motivated to improve her situation, and with some insight as to what is effective for her.  Military/work problems/concerns: None noted.  Leisure Activities/Daily Functioning  impaired functioning  Legal Status  No Legal Problems  Medical/Nutritional Concerns  unspecified  Comments: Jill Alexander reported that she often perseverates on perceived medical problems, and described herself as a hypochondriac  Religion/Spirituality  Not reported  Other  General Behavior: cooperative  Attire: appropriate  Gait: normal  Motor Activity: normal  Stream of Thought - Productivity: spontaneous  Stream of thought - Progression:  normal  Stream of thought - Language: normal  Emotional tone and reactions - Affect: appropriate  Mental trend/Content of thoughts - Perception: normal  Mental trend/Content of thoughts - Orientation: normal  Mental trend/Content of thoughts - Memory: normal  Mental trend/Content of thoughts - General knowledge: consistent with education  Insight: good  Judgment: good  Intelligence: average  Diagnostic Summary  Autism Spectrum Disorder (F84.0)   [facility] Counselor/Therapist Progress Note   Patient ID: Jill Alexander, MRN: [id number],     Individualized Treatment Plan Strengths: Enjoys reading, Seeking counseling, In training program through [employer], I like humor and jokes  Supports: Her dog, her bestfriend since childhood, her parents, her sister    Goal/Needs for Treatment:  In order of importance to patient 1) Emotional Regulation/Anxiety 2) Improve Positive self talk      Client Statement of Needs: Find ways of being nicer to myself, cope w/ anxiety and worry    Treatment Level:Outpatient Counseling  Symptoms:Anxiety, Perseverative worry, Negative self talk  Client Treatment Preferences:weekly counseling,  Prefers to be called Jill Alexander    Healthcare consumer's goal for treatment:  Dr. Luceal Hollibaugh will support the patient's ability to achieve the goals identified. Cognitive Behavioral Therapy, Supportive Counseling, Mindfulness and Relaxation Strategies and other evidenced-based practices will be used to promote progress towards healthy functioning.    Healthcare consumer will: Actively participate in therapy, working towards healthy functioning.     *Justification for Continuation/Discontinuation of Goal: R=Revised, O=Ongoing, A=Achieved, D=Discontinued   Goal 1) Jill Alexander will develop strategies to regulate her emotions and improve her positive self talk   Objective: Jill Alexander will incorporate her adaptive coping skills into her routine and use them  more effectively and consistently   Target Date: 05/18/2025 Progress: 30% Modality: Individual Frequency: Weekly  Goal 2: Jill Alexander will improve communication with others, especially close family members, by working on her tendency to assume the worst.   Target Date: 05/18/2025 Progress: 25% Modality: Individual Frequency: Weekly  Goal 3: Jill Alexander will work on increasing the frequency of positive self-talk  Target Date: 05/18/2025 Progress: 0 Modality: Individual  Frequency: Weekly  Interventions: Therapist will help Jill Alexander to notice and disengage from maladaptive thoughts and behaviors using CBT based strategies Jill Alexander will have opportunities to process her experiences in session Therapist will engage Jill Alexander in discussion of coping strategies and creation of a plan to use them regularly Therapist will engage Jill Alexander in regular mood checks to continue to monitor her general mental health, prioritizing this at the start of the session to ensure it is covered   Therapist will provide additional resources as appropriate   Jill Alexander participated in development of tx plan and provided verbal consent.    Siani Utke L Tamre Cass, PhD               Andriette LITTIE Ponto, PhD

## 2024-05-30 ENCOUNTER — Other Ambulatory Visit (HOSPITAL_BASED_OUTPATIENT_CLINIC_OR_DEPARTMENT_OTHER): Payer: Self-pay

## 2024-05-31 ENCOUNTER — Other Ambulatory Visit (HOSPITAL_COMMUNITY): Payer: Self-pay

## 2024-05-31 ENCOUNTER — Other Ambulatory Visit: Payer: Self-pay

## 2024-05-31 MED ORDER — LAMOTRIGINE 200 MG PO TABS: 400.0000 mg | ORAL_TABLET | Freq: Every day | ORAL | 0 refills | Status: AC

## 2024-05-31 MED ORDER — AUVELITY 45-105 MG PO TBCR
1.0000 | EXTENDED_RELEASE_TABLET | Freq: Two times a day (BID) | ORAL | 0 refills | Status: AC
  Filled 2024-05-31 (×2): qty 180, 90d supply, fill #0

## 2024-05-31 MED ORDER — GUANFACINE HCL ER 2 MG PO TB24: 2.0000 mg | ORAL_TABLET | Freq: Every day | ORAL | 0 refills | Status: AC

## 2024-06-01 ENCOUNTER — Ambulatory Visit: Admitting: Clinical

## 2024-06-01 DIAGNOSIS — F840 Autistic disorder: Secondary | ICD-10-CM | POA: Diagnosis not present

## 2024-06-03 ENCOUNTER — Other Ambulatory Visit (HOSPITAL_COMMUNITY): Payer: Self-pay

## 2024-06-08 ENCOUNTER — Ambulatory Visit: Admitting: Clinical

## 2024-06-15 ENCOUNTER — Ambulatory Visit: Admitting: Clinical

## 2024-06-22 ENCOUNTER — Ambulatory Visit: Admitting: Clinical

## 2024-06-29 ENCOUNTER — Ambulatory Visit: Admitting: Clinical

## 2024-07-13 ENCOUNTER — Ambulatory Visit: Admitting: Clinical

## 2024-07-20 ENCOUNTER — Ambulatory Visit: Admitting: Clinical

## 2025-02-08 ENCOUNTER — Ambulatory Visit: Admitting: Neurology
